# Patient Record
Sex: Female | Born: 1975
Health system: Southern US, Community
[De-identification: ages and names within clinical notes are randomized; demographics above are authoritative.]

## PROBLEM LIST (undated history)

## (undated) DIAGNOSIS — J302 Other seasonal allergic rhinitis: Secondary | ICD-10-CM

## (undated) DIAGNOSIS — C210 Malignant neoplasm of anus, unspecified: Secondary | ICD-10-CM

## (undated) DIAGNOSIS — IMO0002 Reserved for concepts with insufficient information to code with codable children: Secondary | ICD-10-CM

## (undated) DIAGNOSIS — T7840XA Allergy, unspecified, initial encounter: Secondary | ICD-10-CM

## (undated) DIAGNOSIS — M1990 Unspecified osteoarthritis, unspecified site: Secondary | ICD-10-CM

## (undated) DIAGNOSIS — I82409 Acute embolism and thrombosis of unspecified deep veins of unspecified lower extremity: Secondary | ICD-10-CM

## (undated) DIAGNOSIS — I10 Essential (primary) hypertension: Secondary | ICD-10-CM

## (undated) DIAGNOSIS — Z803 Family history of malignant neoplasm of breast: Secondary | ICD-10-CM

## (undated) DIAGNOSIS — M21371 Foot drop, right foot: Secondary | ICD-10-CM

## (undated) DIAGNOSIS — G709 Myoneural disorder, unspecified: Secondary | ICD-10-CM

## (undated) DIAGNOSIS — E785 Hyperlipidemia, unspecified: Secondary | ICD-10-CM

## (undated) DIAGNOSIS — F4310 Post-traumatic stress disorder, unspecified: Secondary | ICD-10-CM

## (undated) DIAGNOSIS — Z5189 Encounter for other specified aftercare: Secondary | ICD-10-CM

## (undated) DIAGNOSIS — E039 Hypothyroidism, unspecified: Secondary | ICD-10-CM

## (undated) DIAGNOSIS — G35 Multiple sclerosis: Secondary | ICD-10-CM

## (undated) DIAGNOSIS — N319 Neuromuscular dysfunction of bladder, unspecified: Secondary | ICD-10-CM

## (undated) DIAGNOSIS — F32A Depression, unspecified: Secondary | ICD-10-CM

## (undated) DIAGNOSIS — R269 Unspecified abnormalities of gait and mobility: Secondary | ICD-10-CM

## (undated) DIAGNOSIS — E079 Disorder of thyroid, unspecified: Secondary | ICD-10-CM

## (undated) DIAGNOSIS — Z95828 Presence of other vascular implants and grafts: Secondary | ICD-10-CM

## (undated) DIAGNOSIS — F329 Major depressive disorder, single episode, unspecified: Secondary | ICD-10-CM

## (undated) HISTORY — DX: Other seasonal allergic rhinitis: J30.2

## (undated) HISTORY — DX: Disorder of thyroid, unspecified: E07.9

## (undated) HISTORY — DX: Unspecified abnormalities of gait and mobility: R26.9

## (undated) HISTORY — DX: Hyperlipidemia, unspecified: E78.5

## (undated) HISTORY — PX: PORTACATH PLACEMENT: SHX2246

## (undated) HISTORY — DX: Reserved for concepts with insufficient information to code with codable children: IMO0002

## (undated) HISTORY — DX: Encounter for other specified aftercare: Z51.89

## (undated) HISTORY — DX: Depression, unspecified: F32.A

## (undated) HISTORY — DX: Foot drop, right foot: M21.371

## (undated) HISTORY — DX: Family history of malignant neoplasm of breast: Z80.3

## (undated) HISTORY — DX: Neuromuscular dysfunction of bladder, unspecified: N31.9

## (undated) HISTORY — DX: Myoneural disorder, unspecified: G70.9

## (undated) HISTORY — DX: Allergy, unspecified, initial encounter: T78.40XA

## (undated) HISTORY — DX: Post-traumatic stress disorder, unspecified: F43.10

## (undated) HISTORY — DX: Essential (primary) hypertension: I10

## (undated) HISTORY — DX: Unspecified osteoarthritis, unspecified site: M19.90

## (undated) HISTORY — DX: Acute embolism and thrombosis of unspecified deep veins of unspecified lower extremity: I82.409

## (undated) HISTORY — DX: Multiple sclerosis: G35

## (undated) HISTORY — DX: Major depressive disorder, single episode, unspecified: F32.9

## (undated) HISTORY — PX: COLON SURGERY: SHX602

---
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
## 2008-05-01 HISTORY — PX: GASTRIC BYPASS: SHX52

## 2010-11-07 ENCOUNTER — Ambulatory Visit: Payer: Self-pay | Admitting: Internal Medicine

## 2010-12-19 ENCOUNTER — Ambulatory Visit: Payer: Self-pay

## 2011-01-16 DIAGNOSIS — F32A Depression, unspecified: Secondary | ICD-10-CM | POA: Insufficient documentation

## 2011-01-16 DIAGNOSIS — F329 Major depressive disorder, single episode, unspecified: Secondary | ICD-10-CM | POA: Insufficient documentation

## 2011-03-12 ENCOUNTER — Ambulatory Visit: Payer: Self-pay | Admitting: Internal Medicine

## 2011-05-02 DIAGNOSIS — Z9289 Personal history of other medical treatment: Secondary | ICD-10-CM

## 2011-05-02 DIAGNOSIS — I82409 Acute embolism and thrombosis of unspecified deep veins of unspecified lower extremity: Secondary | ICD-10-CM

## 2011-05-02 HISTORY — DX: Personal history of other medical treatment: Z92.89

## 2011-05-02 HISTORY — DX: Acute embolism and thrombosis of unspecified deep veins of unspecified lower extremity: I82.409

## 2011-06-21 ENCOUNTER — Ambulatory Visit: Payer: Self-pay

## 2011-06-22 ENCOUNTER — Ambulatory Visit: Payer: Self-pay

## 2011-06-25 ENCOUNTER — Ambulatory Visit: Payer: Self-pay | Admitting: Internal Medicine

## 2011-07-26 DIAGNOSIS — Z9884 Bariatric surgery status: Secondary | ICD-10-CM | POA: Insufficient documentation

## 2012-02-05 ENCOUNTER — Ambulatory Visit: Payer: Self-pay

## 2012-02-05 LAB — URINALYSIS, COMPLETE
Blood: NEGATIVE
Ketone: NEGATIVE
Leukocyte Esterase: NEGATIVE
Nitrite: NEGATIVE
Ph: 6.5 (ref 4.5–8.0)
Protein: NEGATIVE
Specific Gravity: 1.015 (ref 1.003–1.030)

## 2012-02-07 LAB — URINE CULTURE

## 2012-05-01 HISTORY — PX: INTERSTIM IMPLANT PLACEMENT: SHX5130

## 2014-11-26 DIAGNOSIS — G35 Multiple sclerosis: Secondary | ICD-10-CM | POA: Diagnosis not present

## 2014-11-26 DIAGNOSIS — N319 Neuromuscular dysfunction of bladder, unspecified: Secondary | ICD-10-CM | POA: Diagnosis not present

## 2014-11-26 DIAGNOSIS — N3941 Urge incontinence: Secondary | ICD-10-CM | POA: Diagnosis not present

## 2014-11-30 DIAGNOSIS — G35 Multiple sclerosis: Secondary | ICD-10-CM | POA: Diagnosis not present

## 2014-11-30 DIAGNOSIS — N3941 Urge incontinence: Secondary | ICD-10-CM | POA: Diagnosis not present

## 2014-12-22 DIAGNOSIS — F331 Major depressive disorder, recurrent, moderate: Secondary | ICD-10-CM | POA: Diagnosis not present

## 2014-12-28 DIAGNOSIS — F331 Major depressive disorder, recurrent, moderate: Secondary | ICD-10-CM | POA: Diagnosis not present

## 2015-01-05 DIAGNOSIS — R300 Dysuria: Secondary | ICD-10-CM | POA: Diagnosis not present

## 2015-01-08 DIAGNOSIS — G35 Multiple sclerosis: Secondary | ICD-10-CM | POA: Diagnosis not present

## 2015-01-08 DIAGNOSIS — R5382 Chronic fatigue, unspecified: Secondary | ICD-10-CM | POA: Diagnosis not present

## 2015-01-18 DIAGNOSIS — G35 Multiple sclerosis: Secondary | ICD-10-CM | POA: Diagnosis not present

## 2015-01-19 DIAGNOSIS — K219 Gastro-esophageal reflux disease without esophagitis: Secondary | ICD-10-CM | POA: Diagnosis not present

## 2015-01-19 DIAGNOSIS — N3941 Urge incontinence: Secondary | ICD-10-CM | POA: Diagnosis not present

## 2015-01-19 DIAGNOSIS — G35 Multiple sclerosis: Secondary | ICD-10-CM | POA: Diagnosis not present

## 2015-01-19 DIAGNOSIS — F329 Major depressive disorder, single episode, unspecified: Secondary | ICD-10-CM | POA: Diagnosis not present

## 2015-01-19 DIAGNOSIS — N319 Neuromuscular dysfunction of bladder, unspecified: Secondary | ICD-10-CM | POA: Diagnosis not present

## 2015-01-19 DIAGNOSIS — E039 Hypothyroidism, unspecified: Secondary | ICD-10-CM | POA: Diagnosis not present

## 2015-01-19 DIAGNOSIS — Z9889 Other specified postprocedural states: Secondary | ICD-10-CM | POA: Diagnosis not present

## 2015-01-19 DIAGNOSIS — Z01818 Encounter for other preprocedural examination: Secondary | ICD-10-CM | POA: Diagnosis not present

## 2015-01-25 DIAGNOSIS — E039 Hypothyroidism, unspecified: Secondary | ICD-10-CM | POA: Diagnosis not present

## 2015-01-25 DIAGNOSIS — N3281 Overactive bladder: Secondary | ICD-10-CM | POA: Diagnosis not present

## 2015-01-25 DIAGNOSIS — D649 Anemia, unspecified: Secondary | ICD-10-CM | POA: Diagnosis not present

## 2015-01-25 DIAGNOSIS — F4310 Post-traumatic stress disorder, unspecified: Secondary | ICD-10-CM | POA: Diagnosis not present

## 2015-01-25 DIAGNOSIS — J309 Allergic rhinitis, unspecified: Secondary | ICD-10-CM | POA: Diagnosis not present

## 2015-01-25 DIAGNOSIS — Z86718 Personal history of other venous thrombosis and embolism: Secondary | ICD-10-CM | POA: Diagnosis not present

## 2015-01-25 DIAGNOSIS — G35 Multiple sclerosis: Secondary | ICD-10-CM | POA: Diagnosis not present

## 2015-01-25 DIAGNOSIS — Z87891 Personal history of nicotine dependence: Secondary | ICD-10-CM | POA: Diagnosis not present

## 2015-01-25 DIAGNOSIS — K219 Gastro-esophageal reflux disease without esophagitis: Secondary | ICD-10-CM | POA: Diagnosis not present

## 2015-01-25 DIAGNOSIS — K5900 Constipation, unspecified: Secondary | ICD-10-CM | POA: Diagnosis not present

## 2015-01-25 DIAGNOSIS — F329 Major depressive disorder, single episode, unspecified: Secondary | ICD-10-CM | POA: Diagnosis not present

## 2015-01-25 DIAGNOSIS — Z9884 Bariatric surgery status: Secondary | ICD-10-CM | POA: Diagnosis not present

## 2015-01-25 DIAGNOSIS — E669 Obesity, unspecified: Secondary | ICD-10-CM | POA: Diagnosis not present

## 2015-01-25 DIAGNOSIS — N3941 Urge incontinence: Secondary | ICD-10-CM | POA: Diagnosis not present

## 2015-01-25 DIAGNOSIS — M1712 Unilateral primary osteoarthritis, left knee: Secondary | ICD-10-CM | POA: Diagnosis not present

## 2015-02-05 DIAGNOSIS — N3941 Urge incontinence: Secondary | ICD-10-CM | POA: Diagnosis not present

## 2015-03-30 DIAGNOSIS — F331 Major depressive disorder, recurrent, moderate: Secondary | ICD-10-CM | POA: Diagnosis not present

## 2015-04-15 DIAGNOSIS — R5383 Other fatigue: Secondary | ICD-10-CM | POA: Diagnosis not present

## 2015-04-15 DIAGNOSIS — N390 Urinary tract infection, site not specified: Secondary | ICD-10-CM | POA: Diagnosis not present

## 2015-04-15 DIAGNOSIS — R635 Abnormal weight gain: Secondary | ICD-10-CM | POA: Diagnosis not present

## 2015-04-15 DIAGNOSIS — E039 Hypothyroidism, unspecified: Secondary | ICD-10-CM | POA: Diagnosis not present

## 2015-04-15 DIAGNOSIS — R300 Dysuria: Secondary | ICD-10-CM | POA: Diagnosis not present

## 2015-06-17 DIAGNOSIS — N912 Amenorrhea, unspecified: Secondary | ICD-10-CM | POA: Diagnosis not present

## 2015-06-17 DIAGNOSIS — N959 Unspecified menopausal and perimenopausal disorder: Secondary | ICD-10-CM | POA: Diagnosis not present

## 2015-06-17 DIAGNOSIS — N951 Menopausal and female climacteric states: Secondary | ICD-10-CM | POA: Diagnosis not present

## 2015-07-03 DIAGNOSIS — N390 Urinary tract infection, site not specified: Secondary | ICD-10-CM | POA: Diagnosis not present

## 2015-07-03 DIAGNOSIS — R300 Dysuria: Secondary | ICD-10-CM | POA: Diagnosis not present

## 2016-01-31 ENCOUNTER — Encounter: Payer: Self-pay | Admitting: Family Medicine

## 2016-01-31 ENCOUNTER — Ambulatory Visit (INDEPENDENT_AMBULATORY_CARE_PROVIDER_SITE_OTHER): Payer: Managed Care, Other (non HMO) | Admitting: Family Medicine

## 2016-01-31 VITALS — BP 114/76 | HR 103 | Temp 97.6°F | Ht 65.0 in | Wt 191.1 lb

## 2016-01-31 DIAGNOSIS — E039 Hypothyroidism, unspecified: Secondary | ICD-10-CM | POA: Diagnosis not present

## 2016-01-31 DIAGNOSIS — F4310 Post-traumatic stress disorder, unspecified: Secondary | ICD-10-CM | POA: Diagnosis not present

## 2016-01-31 DIAGNOSIS — G35 Multiple sclerosis: Secondary | ICD-10-CM

## 2016-01-31 DIAGNOSIS — Z23 Encounter for immunization: Secondary | ICD-10-CM

## 2016-01-31 DIAGNOSIS — G4700 Insomnia, unspecified: Secondary | ICD-10-CM

## 2016-01-31 MED ORDER — RIFAXIMIN 550 MG PO TABS: 550.0000 mg | ORAL_TABLET | Freq: Three times a day (TID) | ORAL | 2 refills | Status: DC

## 2016-01-31 MED ORDER — CLONAZEPAM 2 MG PO TABS: 2.0000 mg | ORAL_TABLET | Freq: Two times a day (BID) | ORAL | 0 refills | Status: DC

## 2016-01-31 MED ORDER — GABAPENTIN 300 MG PO CAPS: ORAL_CAPSULE | ORAL | 3 refills | Status: DC

## 2016-01-31 MED ORDER — CLONAZEPAM 2 MG PO TABS: 2.0000 mg | ORAL_TABLET | Freq: Every day | ORAL | 0 refills | Status: DC

## 2016-01-31 MED ORDER — MIRTAZAPINE 45 MG PO TBDP: 45.0000 mg | ORAL_TABLET | Freq: Every day | ORAL | 2 refills | Status: DC
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
## 2016-02-01 ENCOUNTER — Telehealth: Payer: Self-pay

## 2016-02-01 LAB — CMP14+EGFR
A/G RATIO: 4 — AB (ref 1.2–2.2)
ALBUMIN: 4 g/dL (ref 3.5–5.5)
ALT: 15 IU/L (ref 0–32)
AST: 24 IU/L (ref 0–40)
Alkaline Phosphatase: 118 IU/L — ABNORMAL HIGH (ref 39–117)
BUN / CREAT RATIO: 13 (ref 9–23)
BUN: 9 mg/dL (ref 6–24)
Bilirubin Total: 0.3 mg/dL (ref 0.0–1.2)
CALCIUM: 9.4 mg/dL (ref 8.7–10.2)
CO2: 27 mmol/L (ref 18–29)
CREATININE: 0.71 mg/dL (ref 0.57–1.00)
Chloride: 104 mmol/L (ref 96–106)
GFR, EST AFRICAN AMERICAN: 123 mL/min/{1.73_m2} (ref >59)
GFR, EST NON AFRICAN AMERICAN: 107 mL/min/{1.73_m2} (ref >59)
Globulin, Total: 1 g/dL — ABNORMAL LOW (ref 1.5–4.5)
Glucose: 83 mg/dL (ref 65–99)
POTASSIUM: 4.3 mmol/L (ref 3.5–5.2)
SODIUM: 145 mmol/L — AB (ref 134–144)
TOTAL PROTEIN: 5 g/dL — AB (ref 6.0–8.5)

## 2016-02-01 LAB — CBC WITH DIFFERENTIAL/PLATELET
BASOS: 1 %
Basophils Absolute: 0.1 10*3/uL (ref 0.0–0.2)
EOS (ABSOLUTE): 0.1 10*3/uL (ref 0.0–0.4)
EOS: 2 %
HEMATOCRIT: 37.7 % (ref 34.0–46.6)
HEMOGLOBIN: 12 g/dL (ref 11.1–15.9)
IMMATURE GRANULOCYTES: 0 %
Immature Grans (Abs): 0 10*3/uL (ref 0.0–0.1)
Lymphocytes Absolute: 1.4 10*3/uL (ref 0.7–3.1)
Lymphs: 22 %
MCH: 30.7 pg (ref 26.6–33.0)
MCHC: 31.8 g/dL (ref 31.5–35.7)
MCV: 96 fL (ref 79–97)
MONOCYTES: 17 %
MONOS ABS: 1 10*3/uL — AB (ref 0.1–0.9)
NEUTROS PCT: 58 %
Neutrophils Absolute: 3.7 10*3/uL (ref 1.4–7.0)
Platelets: 409 10*3/uL — ABNORMAL HIGH (ref 150–379)
RBC: 3.91 x10E6/uL (ref 3.77–5.28)
RDW: 18.6 % — AB (ref 12.3–15.4)
WBC: 6.3 10*3/uL (ref 3.4–10.8)

## 2016-02-01 LAB — TSH+FREE T4
Free T4: 1.25 ng/dL (ref 0.82–1.77)
TSH: 0.923 u[IU]/mL (ref 0.450–4.500)

## 2016-02-01 NOTE — Telephone Encounter (Signed)
Use IBS with diarrhea

## 2016-02-01 NOTE — Telephone Encounter (Signed)
Please advise 

## 2016-02-02 NOTE — Progress Notes (Signed)
Patient aware.

## 2016-02-10 ENCOUNTER — Ambulatory Visit: Payer: Self-pay | Admitting: Licensed Clinical Social Worker

## 2016-03-01 ENCOUNTER — Ambulatory Visit: Payer: Private Health Insurance - Indemnity | Admitting: Psychology

## 2016-03-03 ENCOUNTER — Ambulatory Visit (INDEPENDENT_AMBULATORY_CARE_PROVIDER_SITE_OTHER): Payer: Medicare Other | Admitting: Family Medicine

## 2016-03-03 VITALS — BP 112/68 | HR 106 | Ht 65.0 in | Wt 189.2 lb

## 2016-03-03 DIAGNOSIS — Z1231 Encounter for screening mammogram for malignant neoplasm of breast: Secondary | ICD-10-CM

## 2016-03-03 DIAGNOSIS — Z1239 Encounter for other screening for malignant neoplasm of breast: Secondary | ICD-10-CM

## 2016-03-03 MED ORDER — MIRTAZAPINE 45 MG PO TBDP: 45.0000 mg | ORAL_TABLET | Freq: Every day | ORAL | 2 refills | Status: DC

## 2016-03-03 MED ORDER — CLONAZEPAM 2 MG PO TABS: 2.0000 mg | ORAL_TABLET | Freq: Every day | ORAL | 0 refills | Status: DC

## 2016-03-03 NOTE — Progress Notes (Signed)
Subjective:  Patient ID: Amber Williams, female    DOB: 11/07/1975  Age: 40 y.o. MRN: [id number]  CC: Medication Management (pt here today to discuss her medications)   HPI Amber Williams presents for started crying all the time. Switched back to paxil. Can't sleep  Depression screen [facility] 2/9 03/03/2016 01/31/2016  Decreased Interest 0 0  Down, Depressed, Hopeless 0 0  PHQ - 2 Score 0 0    History Amber Williams has a past medical history of Allergy; Arthritis; Blood transfusion without reported diagnosis; Depression; DVT (deep venous thrombosis) ([place]) (2013); Hyperlipidemia; Hypertension; Neuromuscular disorder ([place]); Thyroid disease; and Ulcer ([place]).   She has a past surgical history that includes Colon surgery and Gastric bypass (2010).   Her family history includes Cancer in her mother; Heart disease in her maternal grandfather, maternal grandmother, and paternal grandfather; Hypertension in her father and sister; Miscarriages / Stillbirths in her sister; Stroke in her paternal grandfather.She reports that she quit smoking about 7 years ago. She started smoking about 24 years ago. She has never used smokeless tobacco. She reports that she drinks about 1.8 oz of alcohol per week . She reports that she does not use drugs.    ROS Review of Systems  Constitutional: Negative for fever.  HENT: Negative for congestion, rhinorrhea and sore throat.   Respiratory: Negative for cough and shortness of breath.   Cardiovascular: Negative for chest pain and palpitations.  Gastrointestinal: Negative for abdominal pain.  Musculoskeletal: Negative for arthralgias and myalgias.  Psychiatric/Behavioral: Positive for dysphoric mood. The patient is nervous/anxious.     Objective:  BP 112/68   Pulse (!) 106   Ht 5\' 5"  (1.651 m)   Wt 189 lb 4 oz (85.8 kg)   BMI 31.49 kg/m   BP Readings from Last 3 Encounters:  03/03/16 112/68  01/31/16 114/76    Wt Readings from Last 3 Encounters:  03/03/16  189 lb 4 oz (85.8 kg)  01/31/16 191 lb 2 oz (86.7 kg)     Physical Exam  Constitutional: She is oriented to person, place, and time. She appears well-developed and well-nourished. No distress.  HENT:  Head: Normocephalic and atraumatic.  Eyes: Conjunctivae are normal. Pupils are equal, round, and reactive to light.  Neck: Normal range of motion. Neck supple. No thyromegaly present.  Cardiovascular: Normal rate, regular rhythm and normal heart sounds.   No murmur heard. Pulmonary/Chest: Effort normal and breath sounds normal. No respiratory distress. She has no wheezes. She has no rales.  Abdominal: Soft. Bowel sounds are normal. She exhibits no distension. There is no tenderness.  Musculoskeletal: Normal range of motion.  Lymphadenopathy:    She has no cervical adenopathy.  Neurological: She is alert and oriented to person, place, and time.  Skin: Skin is warm and dry.  Psychiatric: She has a normal mood and affect. Her behavior is normal. Judgment and thought content normal.     Lab Results  Component Value Date   WBC 6.3 01/31/2016   HCT 37.7 01/31/2016   PLT 409 (H) 01/31/2016   GLUCOSE 83 01/31/2016   ALT 15 01/31/2016   AST 24 01/31/2016   NA 145 (H) 01/31/2016   K 4.3 01/31/2016   CL 104 01/31/2016   CREATININE 0.71 01/31/2016   BUN 9 01/31/2016   CO2 27 01/31/2016   TSH 0.923 01/31/2016    No results found.  Assessment & Plan:   Amber Williams was seen today for medication management.  Diagnoses and all orders  for this visit:  Screening for breast cancer -     MM DIGITAL SCREENING BILATERAL; Future  Other orders -     clonazePAM (KLONOPIN) 2 MG tablet; Take 1 tablet (2 mg total) by mouth at bedtime. -     mirtazapine (REMERON SOL-TAB) 45 MG disintegrating tablet; Take 1 tablet (45 mg total) by mouth at bedtime.    I have discontinued Amber Williams rifaximin and temazepam. [name] levothyroxine, omeprazole, Vitamin D3, RiTUXimab  (RITUXAN IV), gabapentin, PARoxetine, clonazePAM, and mirtazapine.  Meds ordered this encounter  Medications  . DISCONTD: temazepam (RESTORIL) 30 MG capsule    Sig: Take by mouth.  [name] PARoxetine (PAXIL) 30 MG tablet    Sig: Take by mouth.  . clonazePAM (KLONOPIN) 2 MG tablet    Sig: Take 1 tablet (2 mg total) by mouth at bedtime.    Dispense:  30 tablet    Refill:  0  . mirtazapine (REMERON SOL-TAB) 45 MG disintegrating tablet    Sig: Take 1 tablet (45 mg total) by mouth at bedtime.    Dispense:  30 tablet    Refill:  2     Follow-up: Return in about 6 weeks (around 04/14/2016) for insomnia, depression.  [name], M.D.

## 2016-03-04 ENCOUNTER — Encounter (HOSPITAL_COMMUNITY): Payer: Self-pay

## 2016-03-04 ENCOUNTER — Emergency Department (HOSPITAL_COMMUNITY): Payer: Medicare Other

## 2016-03-04 ENCOUNTER — Emergency Department (HOSPITAL_COMMUNITY)
Admission: EM | Admit: 2016-03-04 | Discharge: 2016-03-04 | Disposition: A | Discharge status: Home / Self Care | Payer: Medicare Other | Attending: Emergency Medicine | Admitting: Emergency Medicine

## 2016-03-04 DIAGNOSIS — Z87891 Personal history of nicotine dependence: Secondary | ICD-10-CM | POA: Insufficient documentation

## 2016-03-04 DIAGNOSIS — W19XXXA Unspecified fall, initial encounter: Secondary | ICD-10-CM | POA: Diagnosis not present

## 2016-03-04 DIAGNOSIS — Y939 Activity, unspecified: Secondary | ICD-10-CM | POA: Insufficient documentation

## 2016-03-04 DIAGNOSIS — I10 Essential (primary) hypertension: Secondary | ICD-10-CM | POA: Diagnosis not present

## 2016-03-04 DIAGNOSIS — S098XXA Other specified injuries of head, initial encounter: Secondary | ICD-10-CM | POA: Diagnosis not present

## 2016-03-04 DIAGNOSIS — E039 Hypothyroidism, unspecified: Secondary | ICD-10-CM | POA: Insufficient documentation

## 2016-03-04 DIAGNOSIS — Y999 Unspecified external cause status: Secondary | ICD-10-CM | POA: Insufficient documentation

## 2016-03-04 DIAGNOSIS — S0012XA Contusion of left eyelid and periocular area, initial encounter: Secondary | ICD-10-CM | POA: Diagnosis not present

## 2016-03-04 DIAGNOSIS — Y92009 Unspecified place in unspecified non-institutional (private) residence as the place of occurrence of the external cause: Secondary | ICD-10-CM | POA: Diagnosis not present

## 2016-03-04 DIAGNOSIS — S0083XA Contusion of other part of head, initial encounter: Secondary | ICD-10-CM | POA: Diagnosis not present

## 2016-03-04 DIAGNOSIS — S0592XA Unspecified injury of left eye and orbit, initial encounter: Secondary | ICD-10-CM

## 2016-03-04 DIAGNOSIS — S0990XA Unspecified injury of head, initial encounter: Secondary | ICD-10-CM | POA: Diagnosis not present

## 2016-03-04 NOTE — ED Notes (Signed)
Pt tolerated ambulation well with assistance. Family states that ambulation was similar to how she usually walks at home, only a little worse since she did not have her cane or glasses.

## 2016-03-04 NOTE — ED Provider Notes (Signed)
Emergency Department Provider Note   I have reviewed the triage vital signs and the nursing notes.   HISTORY  Chief Complaint Alcohol Intoxication (fall)   HPI Amber Williams is a 40 y.o. female with PMH of depression, HTN, HLD, and MS presents to the emergency department for evaluation of multiple falls at home. The patient cannot recall either fall. She states that she has been drinking both yesterday and today. She does not believe this was a syncope event. She denies using other drugs. She does have multiple sclerosis and some baseline difficulty with ambulation but states it does not seem like she is having a flare of the symptoms are worse than normal. She is complaining of an area of swelling and ecchymosis primarily around the left eye. No vision changes from that eye. No pain with extraocular movement.Denies pain in any other part of her body.  Past Medical History:  Diagnosis Date  . Allergy   . Arthritis   . Blood transfusion without reported diagnosis    2013  . Depression   . DVT (deep venous thrombosis) ([place]) 2013  . Hyperlipidemia   . Hypertension   . Neuromuscular disorder ([place])    dx'd with MS at age of 80  . Thyroid disease   . Ulcer [facility])     Patient Active Problem List   Diagnosis Date Noted  . Hypothyroidism 01/31/2016  . Insomnia 01/31/2016  . Multiple sclerosis ([place]) 01/31/2016  . PTSD (post-traumatic stress disorder) 01/31/2016    Past Surgical History:  Procedure Laterality Date  . COLON SURGERY     Colonoscopy  . GASTRIC BYPASS  2010    Current Outpatient Rx  . Order #: [id number] Class: Historical Med  . Order #: [id number] Class: Print  . Order #: [id number] Class: Historical Med  . Order #: [id number] Class: Normal  . Order #: [id number] Class: Historical Med  . Order #: [id number] Class: Normal  . Order #: [id number] Class: Historical Med  . Order #: [id number] Class: Historical Med  . Order #: [id number] Class: Historical Med     Allergies Tecfidera [dimethyl fumarate]  Family History  Problem Relation Age of Onset  . Cancer Mother     Dx'd at age 9  . Hypertension Father   . Hypertension Sister   . Miscarriages / Stillbirths Sister   . Heart disease Maternal Grandmother   . Heart disease Maternal Grandfather   . Heart disease Paternal Grandfather   . Stroke Paternal Grandfather     Social History Social History  Substance Use Topics  . Smoking status: Former Smoker    Start date: 1993    Quit date: 01/30/2009  . Smokeless tobacco: Never Used  . Alcohol use 1.8 oz/week    3 Shots of liquor per week    Review of Systems  Constitutional: No fever/chills Eyes: No visual changes. Pain and swelling around left eye.  ENT: No sore throat. Cardiovascular: Denies chest pain. Respiratory: Denies shortness of breath. Gastrointestinal: No abdominal pain.  No nausea, no vomiting.  No diarrhea.  No constipation. Genitourinary: Negative for dysuria. Musculoskeletal: Negative for back pain. Skin: Negative for rash. Neurological: Negative for headaches, focal weakness or numbness.  10-point ROS otherwise negative.  ____________________________________________   PHYSICAL EXAM:  VITAL SIGNS: ED Triage Vitals [03/04/16 1440]  Enc Vitals Group     BP (!) 119/42     Pulse Rate 89     Resp 18     Temp 97.6 F (36.4 C)     Temp Source Oral  SpO2 95 %   Constitutional: Alert and oriented. Well appearing and in no acute distress. Eyes: Conjunctivae are mildly injected on the left. PERRL. EOMI without pain. Ecchymosis and periorbital edema around left eye.  Head: Atraumatic. Nose: No congestion/rhinnorhea. Mouth/Throat: Mucous membranes are moist.  Oropharynx non-erythematous. Neck: No stridor. No cervical spine tenderness to palpation. Cardiovascular: Normal rate, regular rhythm. Good peripheral circulation. Grossly normal heart sounds.   Respiratory: Normal respiratory effort.  No retractions.  Lungs CTAB. Gastrointestinal: Soft and nontender. No distention.  Musculoskeletal: No lower extremity tenderness nor edema. No gross deformities of extremities. Neurologic:  Normal speech and language. No gross focal neurologic deficits are appreciated.  Skin:  Skin is warm, dry and intact. No rash noted.  ____________________________________________   LABS (all labs ordered are listed, but only abnormal results are displayed)  None  ____________________________________________  EKG   EKG Interpretation  Date/Time:  Saturday March 04 2016 16:28:56 EDT Ventricular Rate:  101 PR Interval:    QRS Duration: 138 QT Interval:  388 QTC Calculation: 503 R Axis:   -16 Text Interpretation:  Sinus tachycardia Nonspecific intraventricular conduction delay Anteroseptal infarct, old Artifact in lead(s) I III aVR aVL aVF V2 V6 No clear STEMI.  Confirmed by Reginaldo Hazard MD, Lylianna Fraiser [phone]) on 03/04/2016 4:40:35 PM       ____________________________________________  RADIOLOGY  Ct Head Wo Contrast  Result Date: 03/04/2016 CLINICAL DATA:  Fall yesterday.  Right height bruising. EXAM: CT HEAD WITHOUT CONTRAST CT MAXILLOFACIAL WITHOUT CONTRAST TECHNIQUE: Multidetector CT imaging of the head and cervical spine was performed following the standard protocol without intravenous contrast. Multiplanar CT image reconstructions of the cervical spine were also generated. COMPARISON:  None. FINDINGS: CT HEAD FINDINGS Brain: No evidence of acute infarction, hemorrhage, hydrocephalus, extra-axial collection or mass lesion/mass effect. Vascular: No hyperdense vessel or unexpected calcification. Skull: Normal. Negative for fracture or focal lesion. Sinuses/Orbits: Mild right maxillary sinus mucosal thickening. Remaining visualized sinuses and mastoid air cells are clear. Left preseptal periorbital soft tissue swelling. Globes and orbits otherwise unremarkable. Other: None. CT MAXILLOFACIAL FINDINGS Osseous structures: No  fracture. No osteoblastic or osteolytic lesions. Globes and orbits: There is left preseptal periorbital soft tissue swelling. The left globe is unremarkable. Normal postseptal left orbit. Normal right globe and orbit. Sinuses/mastoid air cells: Moderate right maxillary sinus mucosal thickening. Remaining sinuses are clear. Clear mastoid air cells and middle ear cavities. Soft tissues: No other soft tissue abnormality. No soft tissue mass or adenopathy. Other: None IMPRESSION: HEAD CT:  No acute intracranial abnormalities.  No skull fracture. MAXILLOFACIAL CT: Left periorbital preseptal soft tissue swelling. No injury to the left globe or postseptal orbit. No fractures. Electronically Signed   By: Lajean Manes M.D.   On: 03/04/2016 15:54   Ct Maxillofacial Wo Contrast  Result Date: 03/04/2016 CLINICAL DATA:  Fall yesterday.  Right height bruising. EXAM: CT HEAD WITHOUT CONTRAST CT MAXILLOFACIAL WITHOUT CONTRAST TECHNIQUE: Multidetector CT imaging of the head and cervical spine was performed following the standard protocol without intravenous contrast. Multiplanar CT image reconstructions of the cervical spine were also generated. COMPARISON:  None. FINDINGS: CT HEAD FINDINGS Brain: No evidence of acute infarction, hemorrhage, hydrocephalus, extra-axial collection or mass lesion/mass effect. Vascular: No hyperdense vessel or unexpected calcification. Skull: Normal. Negative for fracture or focal lesion. Sinuses/Orbits: Mild right maxillary sinus mucosal thickening. Remaining visualized sinuses and mastoid air cells are clear. Left preseptal periorbital soft tissue swelling. Globes and orbits otherwise unremarkable. Other: None. CT MAXILLOFACIAL FINDINGS Osseous structures: No  fracture. No osteoblastic or osteolytic lesions. Globes and orbits: There is left preseptal periorbital soft tissue swelling. The left globe is unremarkable. Normal postseptal left orbit. Normal right globe and orbit. Sinuses/mastoid air  cells: Moderate right maxillary sinus mucosal thickening. Remaining sinuses are clear. Clear mastoid air cells and middle ear cavities. Soft tissues: No other soft tissue abnormality. No soft tissue mass or adenopathy. Other: None IMPRESSION: HEAD CT:  No acute intracranial abnormalities.  No skull fracture. MAXILLOFACIAL CT: Left periorbital preseptal soft tissue swelling. No injury to the left globe or postseptal orbit. No fractures. Electronically Signed   By: Lajean Manes M.D.   On: 03/04/2016 15:54    ____________________________________________   PROCEDURES  Procedure(s) performed:   Procedures  None ____________________________________________   INITIAL IMPRESSION / ASSESSMENT AND PLAN / ED COURSE  Pertinent labs & imaging results that were available during my care of the patient were reviewed by me and considered in my medical decision making (see chart for details).  Patient with past medical history of multiple sclerosis presents to the emergency department after multiple falls in the setting of drinking alcohol. Her only apparent injury on my exam is some left periorbital edema and ecchymosis. She has intact extraocular movements and normal vision. No evidence of entrapment. Patient is not anticoagulated. She reports to me that she does not feel her alcohol is a problem for her and is not interested in pursuing either inpatient or outpatient detox and rehabilitation. I offered information at discharge if she is interested. Plan for screening EKG and CT scan of the head and face.    04:01 PM Different family members now at bedside. CT scan of the head and face are negative for fracture or acute bleed. Patient is feeling well. She typically ambulates with a cane. Plan for ambulation trial here in the emergency department and reassessment. Plan for Neurology and PCP follow up at home.   05:47 PM Patient ambulated in the emergency department. Patient and family at bedside agree  that she has her baseline level of stability. She is aren't getting physical therapy 3 days a week. Plan for discharge home. Offered referral for alcohol related treatment but the patient refused.   At this time, I do not feel there is any life-threatening condition present. I have reviewed and discussed all results (EKG, imaging, lab, urine as appropriate), exam findings with patient. I have reviewed nursing notes and appropriate previous records.  I feel the patient is safe to be discharged home without further emergent workup. Discussed usual and customary return precautions. Patient and family (if present) verbalize understanding and are comfortable with this plan.  Patient will follow-up with their primary care provider. If they do not have a primary care provider, information for follow-up has been provided to them. All questions have been answered.  ____________________________________________  FINAL CLINICAL IMPRESSION(S) / ED DIAGNOSES  Final diagnoses:  Fall, initial encounter  Injury of head, initial encounter  Left eye injury, initial encounter     MEDICATIONS GIVEN DURING THIS VISIT:  None  NEW OUTPATIENT MEDICATIONS STARTED DURING THIS VISIT:  None   Note:  This document was prepared using Dragon voice recognition software and may include unintentional dictation errors.  Nanda Quinton, MD Emergency Medicine   Margette Fast, MD 03/04/16 [phone]

## 2016-03-04 NOTE — ED Triage Notes (Signed)
Pt EMS,family called for them to check on pt because they had not from hear from her in a few days. Per EMS, when they arrived pt appeared to be intoxicated. The stated pt blew 0.17 on a breathalyzers. Pt admits to drinking yesterday and today. States she fell yesterday. Pt has black and blue eye to right.

## 2016-03-04 NOTE — Discharge Instructions (Signed)

## 2016-03-05 ENCOUNTER — Encounter: Payer: Self-pay | Admitting: Family Medicine

## 2016-03-17 ENCOUNTER — Ambulatory Visit (INDEPENDENT_AMBULATORY_CARE_PROVIDER_SITE_OTHER): Payer: Medicare Other | Admitting: Psychology

## 2016-03-17 DIAGNOSIS — F4321 Adjustment disorder with depressed mood: Secondary | ICD-10-CM

## 2016-03-20 ENCOUNTER — Ambulatory Visit (INDEPENDENT_AMBULATORY_CARE_PROVIDER_SITE_OTHER): Payer: Medicare Other | Admitting: Neurology

## 2016-03-20 ENCOUNTER — Encounter: Payer: Self-pay | Admitting: Neurology

## 2016-03-20 VITALS — BP 119/68 | HR 101 | Ht 65.0 in | Wt 189.0 lb

## 2016-03-20 DIAGNOSIS — G35 Multiple sclerosis: Secondary | ICD-10-CM | POA: Diagnosis not present

## 2016-03-20 DIAGNOSIS — R269 Unspecified abnormalities of gait and mobility: Secondary | ICD-10-CM

## 2016-03-20 DIAGNOSIS — Z5181 Encounter for therapeutic drug level monitoring: Secondary | ICD-10-CM | POA: Diagnosis not present

## 2016-03-20 NOTE — Progress Notes (Signed)
Reason for visit: Multiple sclerosis  Referring physician: Dr. Garry Heater is a 40 y.o. female  History of present illness:  Ms. Sinkler is a 40 year old right-handed white female with a history of multiple sclerosis with symptom onset around age 62. The patient began having double vision and gait problems at that time, she was felt to have ADEM. She was not placed on treatment for multiple sclerosis until about 3 years later. She initially was started on Betaseron, then she went to Avonex, then Copaxone, Rebif, Novantrone, Tecfidera, Gilenya, and most recently Rituxan. The patient has been followed by Dr. Nigel Bridgeman at [facility]. The patient has had MRI evaluations of the brain and cervical spine done in June 2017 that showed good stability. The patient last received a dose of rituximab in September 2017. She is getting 500 mg every 4 months, she has tolerated the drug well, but she continues to have an occasional MS exacerbation. The last exacerbation requiring steroids was in August 2017. The patient has recently moved to this area, she reports some right-sided weakness with bilateral lower extremity weakness, some bladder dysfunction. The patient has an InterStim stimulator in place that is MRI compatible. The patient reports some visual disturbances, most prominent in the right eye. She has cognitive slowing. She indicates that she operates a motor vehicle, but she has to pick up her right leg in order to change from the accelerator to the brake at times. The patient walks with a cane, but he also has a walker and a wheelchair. She reports a recent fall while drinking alcohol on 03/04/2016. The patient had a CT scan of the brain done around that time that was unremarkable. The patient comes to this office for an evaluation.  Past Medical History:  Diagnosis Date  . Allergy   . Arthritis   . Blood transfusion without reported diagnosis    2013  . Depression   . DVT (deep venous  thrombosis) ([place]) 2013  . Hyperlipidemia   . Hypertension   . Neuromuscular disorder ([place])    dx'd with MS at age of 27  . Thyroid disease   . Ulcer [facility])     Past Surgical History:  Procedure Laterality Date  . COLON SURGERY     Colonoscopy  . GASTRIC BYPASS  2010  . INTERSTIM IMPLANT PLACEMENT  2014  . PORTACATH PLACEMENT      Family History  Problem Relation Age of Onset  . Cancer Mother     Dx'd at age 85  . Breast cancer Mother   . Hypertension Father   . Diabetes Father   . Hypertension Sister   . Miscarriages / Stillbirths Sister   . Heart disease Maternal Grandmother   . Diabetes Maternal Grandmother   . Heart disease Maternal Grandfather   . Diabetes Paternal Grandmother   . Heart disease Paternal Grandfather   . Stroke Paternal Grandfather     Social history:  reports that she quit smoking about 7 years ago. She started smoking about 24 years ago. She has never used smokeless tobacco. She reports that she drinks alcohol. She reports that she does not use drugs.  Medications:  Prior to Admission medications   Medication Sig Start Date End Date Taking? Authorizing Provider  Cholecalciferol (VITAMIN D3) 5000 units CAPS Take 5,000 Units by mouth every morning.   Yes Historical Provider, MD  clonazePAM (KLONOPIN) 2 MG tablet Take 1 tablet (2 mg total) by mouth at bedtime. 03/03/16  Yes  [name], MD  gabapentin (NEURONTIN) 300 MG capsule One daily at bedtime for 3 days. Increase by one tablet every third day until sleep is adequate or until 6 pills per night Patient taking differently: 600 mg at bedtime. One daily at bedtime for 3 days. Increase by one tablet every third day until sleep is adequate or until 6 pills per night 01/31/16  Yes [name], MD  levothyroxine (SYNTHROID, LEVOTHROID) 100 MCG tablet Take 100 mcg by mouth daily. 01/08/16  Yes Historical Provider, MD  mirtazapine (REMERON SOL-TAB) 45 MG disintegrating tablet Take 1 tablet (45 mg total) by  mouth at bedtime. 03/03/16  Yes [name], MD  omeprazole (PRILOSEC) 20 MG capsule Take 20 mg by mouth daily. 01/17/16  Yes Historical Provider, MD  PARoxetine (PAXIL) 30 MG tablet Take by mouth. 11/28/15  Yes Historical Provider, MD  RiTUXimab (RITUXAN IV) Inject into the vein.   Yes Historical Provider, MD      Allergies  Allergen Reactions  . Tecfidera [Dimethyl Fumarate] Itching    ROS:  Out of a complete 14 system review of symptoms, the patient complains only of the following symptoms, and all other reviewed systems are negative.  Fatigue Birthmarks Incontinence, constipation Urination problems, urinary incontinence Easy bruising, easy bleeding Feeling cold Joint pain, muscle cramps Weakness, depression Insomnia  Blood pressure 119/68, pulse (!) 101, height 5\' 5"  (1.651 m), weight 189 lb (85.7 kg).  Physical Exam  General: The patient is alert and cooperative at the time of the examination. The patient is moderately obese.  Eyes: Pupils are equal, round, and reactive to light. Discs are flat bilaterally.  Neck: The neck is supple, no carotid bruits are noted.  Respiratory: The respiratory examination is clear.  Cardiovascular: The cardiovascular examination reveals a regular rate and rhythm, no obvious murmurs or rubs are noted.  Skin: Extremities are without significant edema.  Neurologic Exam  Mental status: The patient is alert and oriented x 3 at the time of the examination. The patient has apparent normal recent and remote memory, with an apparently normal attention span and concentration ability.  Cranial nerves: Facial symmetry is present. There is good sensation of the face to pinprick and soft touch bilaterally. The strength of the facial muscles and the muscles to head turning and shoulder shrug are normal bilaterally. Speech is well enunciated, no aphasia or dysarthria is noted. Extraocular movements are full. Visual fields are full. The tongue is  midline, and the patient has symmetric elevation of the soft palate. No obvious hearing deficits are noted.  Motor: The motor testing reveals 5 over 5 strength of the upper extremities. Examination of the lower extremities shows 3/5 strength with hip flexion bilaterally, bilateral foot drops, and hamstring weakness on the right greater than left leg. Good symmetric motor tone is noted throughout.  Sensory: Sensory testing is intact to pinprick, soft touch, vibration sensation, and position sense on all 4 extremities. No evidence of extinction is noted.  Coordination: Cerebellar testing reveals good finger-nose-finger, but she is unable to perform heel-to-shin bilaterally.  Gait and station: Gait is associated with a circumduction pattern on the right, gait is somewhat wide-based. The patient uses a cane for ambulation. Tandem gait was not attempted. Romberg is negative. No drift is seen.  Reflexes: Deep tendon reflexes are notable for increased left basilar reflex as compared to the right, symmetric knee jerk reflexes, depressed ankle jerk reflexes. Toes are downgoing bilaterally.   Assessment/Plan:  1. Multiple sclerosis  2. Neurogenic  bladder  3. Gait disorder  I have recommended that this patient not operate a standard motor vehicle, if she wishes to drive she will need to convert to a vehicle with hand controls. The patient has been on rituximab, we may switch her to [place] which has a similar mechanism of action and has an FDA approved use with multiple sclerosis. The patient will not be able to start this new medication until at least February 2017. The patient will follow-up in about 4 months. Blood work will be done today. The patient will get a flush of her Port-A-Cath. The patient has been progressing with physical therapy, I will reorder physical therapy as recommended by her therapist.  Jill Alexanders MD 03/20/2016 2:04 PM  [facility] [address]  [place], [place] [address]  Phone [phone] Fax [phone]

## 2016-03-22 ENCOUNTER — Telehealth: Payer: Self-pay | Admitting: Neurology

## 2016-03-22 LAB — CBC WITH DIFFERENTIAL/PLATELET
BASOS: 1 %
Basophils Absolute: 0 10*3/uL (ref 0.0–0.2)
EOS (ABSOLUTE): 0.2 10*3/uL (ref 0.0–0.4)
EOS: 3 %
HEMATOCRIT: 39.1 % (ref 34.0–46.6)
Hemoglobin: 12.3 g/dL (ref 11.1–15.9)
Immature Grans (Abs): 0 10*3/uL (ref 0.0–0.1)
Immature Granulocytes: 0 %
LYMPHS ABS: 1.3 10*3/uL (ref 0.7–3.1)
Lymphs: 18 %
MCH: 30.6 pg (ref 26.6–33.0)
MCHC: 31.5 g/dL (ref 31.5–35.7)
MCV: 97 fL (ref 79–97)
MONOS ABS: 0.5 10*3/uL (ref 0.1–0.9)
Monocytes: 7 %
NEUTROS ABS: 4.9 10*3/uL (ref 1.4–7.0)
Neutrophils: 71 %
Platelets: 321 10*3/uL (ref 150–379)
RBC: 4.02 x10E6/uL (ref 3.77–5.28)
RDW: 17.8 % — AB (ref 12.3–15.4)
WBC: 6.9 10*3/uL (ref 3.4–10.8)

## 2016-03-22 LAB — COMPREHENSIVE METABOLIC PANEL
ALBUMIN: 4.2 g/dL (ref 3.5–5.5)
ALK PHOS: 94 IU/L (ref 39–117)
ALT: 22 IU/L (ref 0–32)
AST: 22 IU/L (ref 0–40)
Albumin/Globulin Ratio: 3 — ABNORMAL HIGH (ref 1.2–2.2)
BUN / CREAT RATIO: 9 (ref 9–23)
BUN: 8 mg/dL (ref 6–24)
Bilirubin Total: 0.3 mg/dL (ref 0.0–1.2)
CALCIUM: 9.4 mg/dL (ref 8.7–10.2)
CO2: 26 mmol/L (ref 18–29)
CREATININE: 0.92 mg/dL (ref 0.57–1.00)
Chloride: 105 mmol/L (ref 96–106)
GFR, EST AFRICAN AMERICAN: 90 mL/min/{1.73_m2} (ref >59)
GFR, EST NON AFRICAN AMERICAN: 78 mL/min/{1.73_m2} (ref >59)
GLOBULIN, TOTAL: 1.4 g/dL — AB (ref 1.5–4.5)
Glucose: 100 mg/dL — ABNORMAL HIGH (ref 65–99)
Potassium: 4.5 mmol/L (ref 3.5–5.2)
SODIUM: 147 mmol/L — AB (ref 134–144)
Total Protein: 5.6 g/dL — ABNORMAL LOW (ref 6.0–8.5)

## 2016-03-22 LAB — EBV, CHRONIC/ACTIVE INFECTION
EBV Early Antigen Ab, IgG: <9 U/mL (ref 0.0–8.9)
EBV NA IgG: 72.6 U/mL — ABNORMAL HIGH (ref 0.0–17.9)
EBV VCA IgG: 292 U/mL — ABNORMAL HIGH (ref 0.0–17.9)

## 2016-03-22 LAB — QUANTIFERON IN TUBE
QFT TB AG MINUS NIL VALUE: 0.01 IU/mL
QUANTIFERON MITOGEN VALUE: 8.79 [IU]/mL
QUANTIFERON TB AG VALUE: 0.03 IU/mL
QUANTIFERON TB GOLD: NEGATIVE
Quantiferon Nil Value: 0.02 IU/mL

## 2016-03-22 LAB — QUANTIFERON TB GOLD ASSAY (BLOOD)

## 2016-03-22 LAB — HEPATITIS B SURFACE ANTIGEN: HEP B S AG: NEGATIVE

## 2016-03-22 LAB — HEPATITIS C ANTIBODY

## 2016-03-22 LAB — HEPATITIS B CORE ANTIBODY, TOTAL: HEP B C TOTAL AB: NEGATIVE

## 2016-03-22 LAB — VARICELLA ZOSTER ANTIBODY, IGG: VARICELLA: 270 {index} (ref >165)

## 2016-03-22 NOTE — Telephone Encounter (Signed)
I called patient. The chemistry panel shows a slightly high sodium level, slightly low protein level. The CBC is unremarkable, rest of blood work is relative the unremarkable, the patient has evidence of prior exposure to Epstein-Barr virus, no active infection.  The JC virus antibody panel is still pending.  We are working on getting the patient on Ocrevus, the patient is to call me if she has not heard anything in 2 weeks.

## 2016-03-28 ENCOUNTER — Telehealth: Payer: Self-pay | Admitting: Neurology

## 2016-03-28 MED ORDER — DEXAMETHASONE 2 MG PO TABS: ORAL_TABLET | ORAL | 0 refills | Status: DC

## 2016-03-28 NOTE — Telephone Encounter (Signed)
Pt was seen in office last week for follow-up of MS. PT was ordered and plans to start pt on Ocrevus. Over the past few days, she has been having increased difficulty w/ mobility. Awaiting JCV antibody results.

## 2016-03-28 NOTE — Telephone Encounter (Signed)
I called patient. The patient has had a mild change in her ability to ambulate over the last 2 days. She is having to rely on her walker more. She denies any fevers, chills, symptoms of a bladder infection. No change in bladder function has been noted. She has had no change in sensation. Her right leg is weaker than the left.  The patient is already in physical therapy. We will try short course of dexamethasone. She will call me if any issues arise.  She is being set up for Ocrevus that should be started in February 2018.

## 2016-03-28 NOTE — Telephone Encounter (Signed)
Patient is calling stating she thinks she is having an MS flare up. Since last Saturday she has had trouble walking and moving. Please call and discuss.

## 2016-03-28 NOTE — Addendum Note (Signed)
Addended by: Margette Fast on: 03/28/2016 11:20 AM   Modules accepted: Orders

## 2016-03-29 ENCOUNTER — Telehealth: Payer: Self-pay | Admitting: Neurology

## 2016-03-29 NOTE — Telephone Encounter (Signed)
JC virus antibody level was negative.

## 2016-04-03 DIAGNOSIS — R2681 Unsteadiness on feet: Secondary | ICD-10-CM | POA: Diagnosis not present

## 2016-04-03 DIAGNOSIS — G35 Multiple sclerosis: Secondary | ICD-10-CM | POA: Diagnosis not present

## 2016-04-04 DIAGNOSIS — G35 Multiple sclerosis: Secondary | ICD-10-CM | POA: Diagnosis not present

## 2016-04-04 DIAGNOSIS — R2681 Unsteadiness on feet: Secondary | ICD-10-CM | POA: Diagnosis not present

## 2016-04-05 ENCOUNTER — Ambulatory Visit (INDEPENDENT_AMBULATORY_CARE_PROVIDER_SITE_OTHER): Payer: Commercial Managed Care - HMO | Admitting: Psychology

## 2016-04-05 DIAGNOSIS — F4321 Adjustment disorder with depressed mood: Secondary | ICD-10-CM | POA: Diagnosis not present

## 2016-04-06 DIAGNOSIS — R2681 Unsteadiness on feet: Secondary | ICD-10-CM | POA: Diagnosis not present

## 2016-04-06 DIAGNOSIS — G35 Multiple sclerosis: Secondary | ICD-10-CM | POA: Diagnosis not present

## 2016-04-12 DIAGNOSIS — R2681 Unsteadiness on feet: Secondary | ICD-10-CM | POA: Diagnosis not present

## 2016-04-12 DIAGNOSIS — G35 Multiple sclerosis: Secondary | ICD-10-CM | POA: Diagnosis not present

## 2016-04-13 DIAGNOSIS — G35 Multiple sclerosis: Secondary | ICD-10-CM | POA: Diagnosis not present

## 2016-04-13 DIAGNOSIS — R2681 Unsteadiness on feet: Secondary | ICD-10-CM | POA: Diagnosis not present

## 2016-04-14 ENCOUNTER — Ambulatory Visit (INDEPENDENT_AMBULATORY_CARE_PROVIDER_SITE_OTHER): Payer: Commercial Managed Care - HMO | Admitting: Family Medicine

## 2016-04-14 ENCOUNTER — Encounter: Payer: Self-pay | Admitting: Family Medicine

## 2016-04-14 VITALS — BP 132/80 | HR 80 | Temp 96.9°F | Ht 65.0 in | Wt 197.0 lb

## 2016-04-14 DIAGNOSIS — E039 Hypothyroidism, unspecified: Secondary | ICD-10-CM | POA: Diagnosis not present

## 2016-04-14 DIAGNOSIS — G35 Multiple sclerosis: Secondary | ICD-10-CM

## 2016-04-14 DIAGNOSIS — G4700 Insomnia, unspecified: Secondary | ICD-10-CM

## 2016-04-14 MED ORDER — CLONAZEPAM 2 MG PO TABS: 2.0000 mg | ORAL_TABLET | Freq: Every day | ORAL | 0 refills | Status: DC

## 2016-04-14 MED ORDER — ROPINIROLE HCL 1 MG PO TABS: 1.0000 mg | ORAL_TABLET | Freq: Every day | ORAL | 3 refills | Status: DC

## 2016-04-14 MED ORDER — MIRTAZAPINE 45 MG PO TBDP: 45.0000 mg | ORAL_TABLET | Freq: Every day | ORAL | 2 refills | Status: DC

## 2016-04-14 MED ORDER — CLONAZEPAM 2 MG PO TABS: 2.0000 mg | ORAL_TABLET | Freq: Every day | ORAL | 2 refills | Status: DC

## 2016-04-14 NOTE — Progress Notes (Signed)
Subjective:  Patient ID: Amber Williams, female    DOB: [birth year]  Age: 40 y.o. MRN: [id number]  CC: Insomnia (pt here today following up for insomnia and feels the klonopin and mirtazapine is helping and she is doing much better)   HPI Amber Williams presents for insomnia tx helping, however, She will occasionally wake up during the night. At that time she will take a gabapentin. She is never increased it past 1-2 tablets because of the sense of having to take so many pills. She says that she'll read her phone or TB. Couple of hours later she'll go to sleep again. No serotonin syndrome like symptoms reported.   History Amber Williams has a past medical history of Allergy; Arthritis; Blood transfusion without reported diagnosis; Depression; DVT (deep venous thrombosis) ([place]) (2013); Hyperlipidemia; Hypertension; Neuromuscular disorder ([place]); Thyroid disease; and Ulcer ([place]).   She has a past surgical history that includes Colon surgery; Gastric bypass (2010); Interstim Implant placement (2014); and Portacath placement.   Her family history includes Breast cancer in her mother; Cancer in her mother; Diabetes in her father, maternal grandmother, and paternal grandmother; Heart disease in her maternal grandfather, maternal grandmother, and paternal grandfather; Hypertension in her father and sister; Miscarriages / [name] in her sister; Stroke in her paternal grandfather.She reports that she quit smoking about 7 years ago. She started smoking about 24 years ago. She has never used smokeless tobacco. She reports that she drinks alcohol. She reports that she does not use drugs.    ROS Review of Systems  Constitutional: Negative for activity change, appetite change and fever.  HENT: Negative for congestion, rhinorrhea and sore throat.   Eyes: Negative for visual disturbance.  Respiratory: Negative for cough and shortness of breath.   Cardiovascular: Negative for chest pain and palpitations.    Gastrointestinal: Negative for abdominal pain, diarrhea and nausea.  Genitourinary: Negative for dysuria.  Musculoskeletal: Negative for arthralgias and myalgias.    Objective:  BP 132/80   Pulse 80   Temp (!) 96.9 F (36.1 C) (Oral)   Ht 5\' 5"  (1.651 m)   Wt 197 lb (89.4 kg)   BMI 32.78 kg/m   BP Readings from Last 3 Encounters:  04/14/16 132/80  03/20/16 119/68  03/04/16 132/60    Wt Readings from Last 3 Encounters:  04/14/16 197 lb (89.4 kg)  03/20/16 189 lb (85.7 kg)  03/03/16 189 lb 4 oz (85.8 kg)     Physical Exam  Constitutional: She is oriented to person, place, and time. She appears well-developed and well-nourished. No distress.  HENT:  Head: Normocephalic and atraumatic.  Eyes: Conjunctivae are normal. Pupils are equal, round, and reactive to light.  Neck: Normal range of motion. Neck supple. No thyromegaly present.  Cardiovascular: Normal rate, regular rhythm and normal heart sounds.   No murmur heard. Pulmonary/Chest: Effort normal and breath sounds normal. No respiratory distress. She has no wheezes. She has no rales.  Abdominal: Soft. Bowel sounds are normal. She exhibits no distension. There is no tenderness.  Musculoskeletal: Normal range of motion.  Lymphadenopathy:    She has no cervical adenopathy.  Neurological: She is alert and oriented to person, place, and time.  Skin: Skin is warm and dry.  Psychiatric: She has a normal mood and affect. Her behavior is normal. Judgment and thought content normal.     Lab Results  Component Value Date   WBC 6.9 03/20/2016   HCT 39.1 03/20/2016   PLT 321 03/20/2016  GLUCOSE 100 (H) 03/20/2016   ALT 22 03/20/2016   AST 22 03/20/2016   NA 147 (H) 03/20/2016   K 4.5 03/20/2016   CL 105 03/20/2016   CREATININE 0.92 03/20/2016   BUN 8 03/20/2016   CO2 26 03/20/2016   TSH 0.923 01/31/2016    Ct Head Wo Contrast  Result Date: 03/04/2016 CLINICAL DATA:  Fall yesterday.  Right height bruising. EXAM:  CT HEAD WITHOUT CONTRAST CT MAXILLOFACIAL WITHOUT CONTRAST TECHNIQUE: Multidetector CT imaging of the head and cervical spine was performed following the standard protocol without intravenous contrast. Multiplanar CT image reconstructions of the cervical spine were also generated. COMPARISON:  None. FINDINGS: CT HEAD FINDINGS Brain: No evidence of acute infarction, hemorrhage, hydrocephalus, extra-axial collection or mass lesion/mass effect. Vascular: No hyperdense vessel or unexpected calcification. Skull: Normal. Negative for fracture or focal lesion. Sinuses/Orbits: Mild right maxillary sinus mucosal thickening. Remaining visualized sinuses and mastoid air cells are clear. Left preseptal periorbital soft tissue swelling. Globes and orbits otherwise unremarkable. Other: None. CT MAXILLOFACIAL FINDINGS Osseous structures: No fracture. No osteoblastic or osteolytic lesions. Globes and orbits: There is left preseptal periorbital soft tissue swelling. The left globe is unremarkable. Normal postseptal left orbit. Normal right globe and orbit. Sinuses/mastoid air cells: Moderate right maxillary sinus mucosal thickening. Remaining sinuses are clear. Clear mastoid air cells and middle ear cavities. Soft tissues: No other soft tissue abnormality. No soft tissue mass or adenopathy. Other: None IMPRESSION: HEAD CT:  No acute intracranial abnormalities.  No skull fracture. MAXILLOFACIAL CT: Left periorbital preseptal soft tissue swelling. No injury to the left globe or postseptal orbit. No fractures. Electronically Signed   By: Lajean Manes M.D.   On: 03/04/2016 15:54   Ct Maxillofacial Wo Contrast  Result Date: 03/04/2016 CLINICAL DATA:  Fall yesterday.  Right height bruising. EXAM: CT HEAD WITHOUT CONTRAST CT MAXILLOFACIAL WITHOUT CONTRAST TECHNIQUE: Multidetector CT imaging of the head and cervical spine was performed following the standard protocol without intravenous contrast. Multiplanar CT image reconstructions of  the cervical spine were also generated. COMPARISON:  None. FINDINGS: CT HEAD FINDINGS Brain: No evidence of acute infarction, hemorrhage, hydrocephalus, extra-axial collection or mass lesion/mass effect. Vascular: No hyperdense vessel or unexpected calcification. Skull: Normal. Negative for fracture or focal lesion. Sinuses/Orbits: Mild right maxillary sinus mucosal thickening. Remaining visualized sinuses and mastoid air cells are clear. Left preseptal periorbital soft tissue swelling. Globes and orbits otherwise unremarkable. Other: None. CT MAXILLOFACIAL FINDINGS Osseous structures: No fracture. No osteoblastic or osteolytic lesions. Globes and orbits: There is left preseptal periorbital soft tissue swelling. The left globe is unremarkable. Normal postseptal left orbit. Normal right globe and orbit. Sinuses/mastoid air cells: Moderate right maxillary sinus mucosal thickening. Remaining sinuses are clear. Clear mastoid air cells and middle ear cavities. Soft tissues: No other soft tissue abnormality. No soft tissue mass or adenopathy. Other: None IMPRESSION: HEAD CT:  No acute intracranial abnormalities.  No skull fracture. MAXILLOFACIAL CT: Left periorbital preseptal soft tissue swelling. No injury to the left globe or postseptal orbit. No fractures. Electronically Signed   By: Lajean Manes M.D.   On: 03/04/2016 15:54    Assessment & Plan:   Kindyl was seen today for insomnia.  Diagnoses and all orders for this visit:  Hypothyroidism, unspecified type  Multiple sclerosis ([place])  Insomnia, unspecified type  Other orders -     Discontinue: clonazePAM (KLONOPIN) 2 MG tablet; Take 1 tablet (2 mg total) by mouth at bedtime. -     rOPINIRole (REQUIP)  1 MG tablet; Take 1 tablet (1 mg total) by mouth daily with supper. -     mirtazapine (REMERON SOL-TAB) 45 MG disintegrating tablet; Take 1 tablet (45 mg total) by mouth at bedtime. -     clonazePAM (KLONOPIN) 2 MG tablet; Take 1 tablet (2 mg total) by  mouth at bedtime.     I have discontinued Ms. Cicco RiTUXimab (RITUXAN IV) and gabapentin. I am also having her start on rOPINIRole. Additionally, I am having her maintain her levothyroxine, omeprazole, Vitamin D3, PARoxetine, dexamethasone, mirtazapine, and clonazePAM.  Meds ordered this encounter  Medications  . DISCONTD: clonazePAM (KLONOPIN) 2 MG tablet    Sig: Take 1 tablet (2 mg total) by mouth at bedtime.    Dispense:  30 tablet    Refill:  0  . rOPINIRole (REQUIP) 1 MG tablet    Sig: Take 1 tablet (1 mg total) by mouth daily with supper.    Dispense:  30 tablet    Refill:  3  . mirtazapine (REMERON SOL-TAB) 45 MG disintegrating tablet    Sig: Take 1 tablet (45 mg total) by mouth at bedtime.    Dispense:  30 tablet    Refill:  2  . clonazePAM (KLONOPIN) 2 MG tablet    Sig: Take 1 tablet (2 mg total) by mouth at bedtime.    Dispense:  30 tablet    Refill:  2     Follow-up: Return in about 3 months (around 07/13/2016).  [name], M.D.

## 2016-04-17 DIAGNOSIS — R2681 Unsteadiness on feet: Secondary | ICD-10-CM | POA: Diagnosis not present

## 2016-04-17 DIAGNOSIS — G35 Multiple sclerosis: Secondary | ICD-10-CM | POA: Diagnosis not present

## 2016-04-26 DIAGNOSIS — G35 Multiple sclerosis: Secondary | ICD-10-CM | POA: Diagnosis not present

## 2016-04-26 DIAGNOSIS — R2681 Unsteadiness on feet: Secondary | ICD-10-CM | POA: Diagnosis not present

## 2016-05-05 ENCOUNTER — Ambulatory Visit: Payer: Self-pay | Admitting: Psychology

## 2016-05-09 ENCOUNTER — Other Ambulatory Visit: Payer: Self-pay

## 2016-05-09 MED ORDER — ROPINIROLE HCL 1 MG PO TABS: 1.0000 mg | ORAL_TABLET | Freq: Every day | ORAL | 0 refills | Status: DC

## 2016-05-09 MED ORDER — PAROXETINE HCL 30 MG PO TABS: 30.0000 mg | ORAL_TABLET | Freq: Every day | ORAL | 0 refills | Status: DC

## 2016-05-30 ENCOUNTER — Telehealth: Payer: Self-pay | Admitting: Neurology

## 2016-05-30 NOTE — Telephone Encounter (Signed)
Spoke to patient she is going to fax me proof of income so I can submit for patient assistance. Ocrevus.

## 2016-06-01 NOTE — Telephone Encounter (Signed)
Fax Change Ocrevus [phone] - telephone [phone].

## 2016-06-01 NOTE — Telephone Encounter (Signed)
All documents have been submitted Ocrevus fax [phone].

## 2016-06-02 ENCOUNTER — Telehealth: Payer: Self-pay

## 2016-06-02 DIAGNOSIS — Z0189 Encounter for other specified special examinations: Secondary | ICD-10-CM

## 2016-06-02 NOTE — Telephone Encounter (Signed)
[employer] Disability forms completed, signed and returned to medical records for payment/processing. Pt is permanently disabled and unable to work.

## 2016-06-21 ENCOUNTER — Encounter: Payer: Self-pay | Admitting: *Deleted

## 2016-06-28 ENCOUNTER — Encounter: Payer: Managed Care, Other (non HMO) | Admitting: *Deleted

## 2016-07-10 ENCOUNTER — Encounter: Payer: Commercial Managed Care - HMO | Admitting: *Deleted

## 2016-07-12 DIAGNOSIS — G35 Multiple sclerosis: Secondary | ICD-10-CM | POA: Diagnosis not present

## 2016-07-14 ENCOUNTER — Ambulatory Visit (INDEPENDENT_AMBULATORY_CARE_PROVIDER_SITE_OTHER): Payer: Medicare HMO | Admitting: Family Medicine

## 2016-07-14 ENCOUNTER — Encounter: Payer: Self-pay | Admitting: Family Medicine

## 2016-07-14 VITALS — BP 113/64 | HR 92 | Temp 98.6°F | Ht 65.0 in | Wt 194.0 lb

## 2016-07-14 DIAGNOSIS — Z23 Encounter for immunization: Secondary | ICD-10-CM | POA: Diagnosis not present

## 2016-07-14 DIAGNOSIS — G4700 Insomnia, unspecified: Secondary | ICD-10-CM | POA: Diagnosis not present

## 2016-07-14 DIAGNOSIS — E039 Hypothyroidism, unspecified: Secondary | ICD-10-CM | POA: Diagnosis not present

## 2016-07-14 DIAGNOSIS — G35 Multiple sclerosis: Secondary | ICD-10-CM | POA: Diagnosis not present

## 2016-07-14 MED ORDER — MIRTAZAPINE 30 MG PO TBDP: 30.0000 mg | ORAL_TABLET | Freq: Every day | ORAL | 5 refills | Status: DC

## 2016-07-14 NOTE — Progress Notes (Signed)
Subjective:  Patient ID: Amber Williams, female    DOB: 16-Aug-1975  Age: 41 y.o. MRN: [id number]  CC: Insomnia (pt here today because she wants to discuss her klonopin and sleeping. She is having trouble staying asleep at night.)   HPI [name] presents for Problems with maintaining sleep. She'll go to sleep well. The Klonopin and ropinirole help well with that. Unfortunately after 3 or 4 hours she is awake and does not get back to sleep. She states she did not try the mirtazapine just because of the use of the other medications. Of note is that she started this week on Ocrevus for her multiple sclerosis.   History Katira has a past medical history of Allergy; Arthritis; Blood transfusion without reported diagnosis; Depression; DVT (deep venous thrombosis) ([place]) (2013); Hyperlipidemia; Hypertension; Neuromuscular disorder ([place]); Thyroid disease; and Ulcer ([place]).   She has a past surgical history that includes Colon surgery; Gastric bypass (2010); Interstim Implant placement (2014); and Portacath placement.   Her family history includes Breast cancer in her mother; Cancer in her mother; Diabetes in her father, maternal grandmother, and paternal grandmother; Heart disease in her maternal grandfather, maternal grandmother, and paternal grandfather; Hypertension in her father and sister; Miscarriages / [name] in her sister; Stroke in her paternal grandfather.She reports that she quit smoking about 7 years ago. She started smoking about 25 years ago. She has never used smokeless tobacco. She reports that she drinks alcohol. She reports that she does not use drugs.    ROS Review of Systems  Constitutional: Negative for activity change, appetite change and fever.  HENT: Negative for congestion, rhinorrhea and sore throat.   Eyes: Negative for visual disturbance.  Respiratory: Negative for cough and shortness of breath.   Cardiovascular: Negative for chest pain and palpitations.    Gastrointestinal: Negative for abdominal pain, diarrhea and nausea.  Genitourinary: Negative for dysuria.  Musculoskeletal: Positive for arthralgias and myalgias.  Psychiatric/Behavioral: Positive for sleep disturbance.    Objective:  BP 113/64   Pulse 92   Temp 98.6 F (37 C) (Oral)   Ht 5' 5"  (8.676 m)   Wt 194 lb (88 kg)   BMI 32.28 kg/m   BP Readings from Last 3 Encounters:  07/14/16 113/64  04/14/16 132/80  03/20/16 119/68    Wt Readings from Last 3 Encounters:  07/14/16 194 lb (88 kg)  04/14/16 197 lb (89.4 kg)  03/20/16 189 lb (85.7 kg)     Physical Exam  Constitutional: She is oriented to person, place, and time. She appears well-developed and well-nourished. No distress.  Cardiovascular: Normal rate and regular rhythm.   Pulmonary/Chest: Breath sounds normal.  Neurological: She is alert and oriented to person, place, and time.  Skin: Skin is warm and dry.  Psychiatric: She has a normal mood and affect.      Assessment & Plan:   Tarnisha was seen today for insomnia.  Diagnoses and all orders for this visit:  Hypothyroidism, unspecified type -     TSH + free T4 -     CBC with Differential/Platelet -     CMP14+EGFR  Insomnia, unspecified type -     CBC with Differential/Platelet -     CMP14+EGFR  Multiple sclerosis (HCC) -     CBC with Differential/Platelet -     CMP14+EGFR  Other orders -     mirtazapine (REMERON SOL-TAB) 30 MG disintegrating tablet; Take 1 tablet (30 mg total) by mouth at bedtime.  Patient is starting an immune suppression type therapy with the [place] Dials therefore we'll go ahead with her Prevnar today.  I have discontinued Ms. Lemon dexamethasone. I have also changed her mirtazapine. Additionally, I am having her maintain her levothyroxine, omeprazole, Vitamin D3, clonazePAM, rOPINIRole, PARoxetine, and Ocrelizumab (OCREVUS IV).  Allergies as of 07/14/2016      Reactions   Tecfidera [dimethyl Fumarate] Itching       Medication List       Accurate as of 07/14/16  8:55 AM. Always use your most recent med list.          clonazePAM 2 MG tablet Commonly known as:  KLONOPIN Take 1 tablet (2 mg total) by mouth at bedtime.   levothyroxine 100 MCG tablet Commonly known as:  SYNTHROID, LEVOTHROID Take 100 mcg by mouth daily.   mirtazapine 30 MG disintegrating tablet Commonly known as:  REMERON SOL-TAB Take 1 tablet (30 mg total) by mouth at bedtime.   OCREVUS IV Inject into the vein.   omeprazole 20 MG capsule Commonly known as:  PRILOSEC Take 20 mg by mouth daily.   PARoxetine 30 MG tablet Commonly known as:  PAXIL Take 1 tablet (30 mg total) by mouth daily.   rOPINIRole 1 MG tablet Commonly known as:  REQUIP Take 1 tablet (1 mg total) by mouth daily with supper.   Vitamin D3 5000 units Caps Take 5,000 Units by mouth every morning.        Follow-up: Return in about 6 months (around 01/14/2017).  [name], M.D.

## 2016-07-14 NOTE — Addendum Note (Signed)
Addended by: Marylin Crosby on: 07/14/2016 05:39 PM   Modules accepted: Orders

## 2016-07-19 ENCOUNTER — Ambulatory Visit: Payer: Medicare Other | Admitting: Neurology

## 2016-07-20 DIAGNOSIS — Z1231 Encounter for screening mammogram for malignant neoplasm of breast: Secondary | ICD-10-CM | POA: Diagnosis not present

## 2016-07-26 ENCOUNTER — Other Ambulatory Visit: Payer: Self-pay | Admitting: Family Medicine

## 2016-07-26 DIAGNOSIS — G35 Multiple sclerosis: Secondary | ICD-10-CM | POA: Diagnosis not present

## 2016-08-03 ENCOUNTER — Encounter: Payer: Self-pay | Admitting: Neurology

## 2016-08-03 ENCOUNTER — Other Ambulatory Visit: Payer: Self-pay | Admitting: *Deleted

## 2016-08-03 ENCOUNTER — Ambulatory Visit (INDEPENDENT_AMBULATORY_CARE_PROVIDER_SITE_OTHER): Payer: Medicare HMO | Admitting: Neurology

## 2016-08-03 ENCOUNTER — Encounter: Payer: Self-pay | Admitting: *Deleted

## 2016-08-03 VITALS — BP 146/79 | HR 119 | Resp 16 | Ht 65.0 in | Wt 188.0 lb

## 2016-08-03 DIAGNOSIS — G35 Multiple sclerosis: Secondary | ICD-10-CM

## 2016-08-03 DIAGNOSIS — N319 Neuromuscular dysfunction of bladder, unspecified: Secondary | ICD-10-CM

## 2016-08-03 DIAGNOSIS — Z5181 Encounter for therapeutic drug level monitoring: Secondary | ICD-10-CM

## 2016-08-03 DIAGNOSIS — R269 Unspecified abnormalities of gait and mobility: Secondary | ICD-10-CM

## 2016-08-03 DIAGNOSIS — G35D Multiple sclerosis, unspecified: Secondary | ICD-10-CM

## 2016-08-03 DIAGNOSIS — M21371 Foot drop, right foot: Secondary | ICD-10-CM

## 2016-08-03 HISTORY — DX: Foot drop, right foot: M21.371

## 2016-08-03 HISTORY — DX: Neuromuscular dysfunction of bladder, unspecified: N31.9

## 2016-08-03 HISTORY — DX: Unspecified abnormalities of gait and mobility: R26.9

## 2016-08-03 NOTE — Progress Notes (Signed)
Pt. currently purchasing her own adult diapers from [facility].  Having difficulty affording them.  I have spoken with Kenney Houseman at [facility]. They can supply pt. with depends and handle insurance billing for her.  Order faxed to [facility] for adult diapers, #120 per month, size S/M/fim

## 2016-08-03 NOTE — Patient Instructions (Signed)
   We will get a referral to Urology and to physical therapy for a brace for the right foot.

## 2016-08-03 NOTE — Progress Notes (Signed)
Reason for visit: multiple sclerosis  Amber Williams is an 41 y.o. female  History of present illness:  Amber Williams is a 41 year old right-handed white female with a history of multiple sclerosis associated with a gait disorder and a neurogenic bladder. The patient has had episodes of falling that are usually associated when she is at home, using a cane instead of the walker. She has not had any falls with a walker. She has bilateral lower extremity weakness, right greater than left associated with hip flexor weakness and a footdrop on the right. The patient has a neurogenic bladder, she has an InterStim stimulator for the bladder. The patient claims that the stimulator is MRI compatible, she has a programming unit where she can turn off the unit or reprogram the unit if needed. The patient uses adult diapers as well. The patient has an AFO brace for the right foot, but it is a PAFO, and she rarely uses the brace. The patient still operate a motor vehicle, but she has difficulty transitioning from the accelerator the brake and vice versa. The patient has just recently started Ocrevus, she has some fatigue following the second dose, but otherwise she has tolerated this well. The patient denies any new symptoms of numbness, weakness, vision complaints, or changes in bladder function. She returns for an evaluation.  Past Medical History:  Diagnosis Date  . Allergy   . Arthritis   . Blood transfusion without reported diagnosis    2013  . Depression   . DVT (deep venous thrombosis) ([place]) 2013  . Gait abnormality 08/03/2016  . Hyperlipidemia   . Hypertension   . Neurogenic bladder 08/03/2016  . Neuromuscular disorder ([place])    dx'd with MS at age of 81  . Right foot drop 08/03/2016  . Thyroid disease   . Ulcer [facility])     Past Surgical History:  Procedure Laterality Date  . COLON SURGERY     Colonoscopy  . GASTRIC BYPASS  2010  . INTERSTIM IMPLANT PLACEMENT  2014  . PORTACATH PLACEMENT       Family History  Problem Relation Age of Onset  . Cancer Mother     Dx'd at age 73  . Breast cancer Mother   . Hypertension Father   . Diabetes Father   . Hypertension Sister   . Miscarriages / Stillbirths Sister   . Heart disease Maternal Grandmother   . Diabetes Maternal Grandmother   . Heart disease Maternal Grandfather   . Diabetes Paternal Grandmother   . Heart disease Paternal Grandfather   . Stroke Paternal Grandfather     Social history:  reports that she quit smoking about 7 years ago. She started smoking about 25 years ago. She has never used smokeless tobacco. She reports that she drinks alcohol. She reports that she does not use drugs.    Allergies  Allergen Reactions  . Tecfidera [Dimethyl Fumarate] Itching    Medications:  Prior to Admission medications   Medication Sig Start Date End Date Taking? Authorizing Provider  Cholecalciferol (VITAMIN D3) 5000 units CAPS Take 5,000 Units by mouth every morning.   Yes Historical Provider, MD  levothyroxine (SYNTHROID, LEVOTHROID) 100 MCG tablet Take 100 mcg by mouth daily. 01/08/16  Yes Historical Provider, MD  mirtazapine (REMERON SOL-TAB) 30 MG disintegrating tablet Take 1 tablet (30 mg total) by mouth at bedtime. 07/14/16  Yes [name], MD  Ocrelizumab (OCREVUS IV) Inject into the vein.   Yes Historical Provider, MD  omeprazole (  PRILOSEC) 20 MG capsule Take 20 mg by mouth daily. 01/17/16  Yes Historical Provider, MD  PARoxetine (PAXIL) 30 MG tablet TAKE 1 TABLET (30 MG TOTAL) BY MOUTH DAILY. 07/27/16  Yes [name], MD  rOPINIRole (REQUIP) 1 MG tablet Take 1 tablet (1 mg total) by mouth daily with supper. 05/09/16  Yes [name], MD    ROS:  Out of a complete 14 system review of symptoms, the patient complains only of the following symptoms, and all other reviewed systems are negative.  Insomnia Incontinence of the bladder, frequency of urination Muscle cramps, walking difficulty Weakness  Blood  pressure (!) 146/79, pulse (!) 119, resp. rate 16, height 5\' 5"  (1.651 m), weight 188 lb (85.3 kg).  Physical Exam  General: The patient is alert and cooperative at the time of the examination.  Skin: No significant peripheral edema is noted.   Neurologic Exam  Mental status: The patient is alert and oriented x 3 at the time of the examination. The patient has apparent normal recent and remote memory, with an apparently normal attention span and concentration ability.   Cranial nerves: Facial symmetry is present. Speech is normal, no aphasia or dysarthria is noted. Extraocular movements are full. Visual fields are full. Pupils are equal, round, and reactive to light. Discs are flat bilaterally.  Motor: The patient has good strength in the upper extremities. With the lower extremities, the patient has 3/5 strength with hip flexion bilaterally, the patient has 3/5 strength with flexion at the knee on the right, 4 minus/5 with extension of the knee on the right. The patient has 4 minus/5 strength with knee extension and flexion on the left. The patient has a footdrop on the right, not present on the left. The patient has better strength with plantar flexion of both feet.  Sensory examination: Soft touch sensation is symmetric on the face and arms, decreased soft sensation on the right leg as compared to the left.  Coordination: The patient has good finger-nose-finger bilaterally. The patient has difficulty performing heel-to-shin on both sides.  Gait and station: The patient has the ability to and leg with a walker, the patient has a circumduction gait with the right leg. Gait is wide-based. Tandem gait was not attempted. Romberg is negative. No drift is seen.  Reflexes: Deep tendon reflexes are symmetric.   Assessment/Plan:  1. Multiple sclerosis  2. Gait disorder  3. Neurogenic bladder  4. Right foot drop  The patient is not safe with driving at this time. It may be possible for  her to operate a motor vehicle more safely if she has a spring-AFO for the right foot that would allow her to transition from the accelerator to the brake more easily. I will refer her to a physical therapist for an evaluation. The patient will be referred to [facility] for the neurogenic bladder and for management of the InterStim stimulator. The patient will have blood work today, she will follow-up in 3 months. She has received the Ocrevus doses, the next dose will be in 6 months. MRI of the brain will be done around August 2018. The last MRI of the head and neck were last done at [facility] in August 2017.   Jill Alexanders MD 08/03/2016 7:56 AM  [facility] [address] [place], [place] [address]  Phone [phone] Fax [phone]

## 2016-08-03 NOTE — Progress Notes (Signed)
FM,RN faxed DME order for adult diapers, size S/M, 120 per 30 days to [facility]. Included most recent office note, demographic sheet, insurance information. Fax: [phone]. Received confirmation.

## 2016-08-04 ENCOUNTER — Telehealth: Payer: Self-pay | Admitting: Neurology

## 2016-08-04 LAB — CBC WITH DIFFERENTIAL/PLATELET
Basophils Absolute: 0 10*3/uL (ref 0.0–0.2)
Basos: 0 %
EOS (ABSOLUTE): 0.1 10*3/uL (ref 0.0–0.4)
EOS: 1 %
HEMATOCRIT: 39.6 % (ref 34.0–46.6)
Hemoglobin: 12.2 g/dL (ref 11.1–15.9)
Immature Grans (Abs): 0 10*3/uL (ref 0.0–0.1)
Immature Granulocytes: 0 %
LYMPHS ABS: 0.9 10*3/uL (ref 0.7–3.1)
Lymphs: 9 %
MCH: 28.6 pg (ref 26.6–33.0)
MCHC: 30.8 g/dL — AB (ref 31.5–35.7)
MCV: 93 fL (ref 79–97)
MONOS ABS: 1.1 10*3/uL — AB (ref 0.1–0.9)
Monocytes: 11 %
NEUTROS PCT: 79 %
Neutrophils Absolute: 8.1 10*3/uL — ABNORMAL HIGH (ref 1.4–7.0)
PLATELETS: 304 10*3/uL (ref 150–379)
RBC: 4.26 x10E6/uL (ref 3.77–5.28)
RDW: 16 % — AB (ref 12.3–15.4)
WBC: 10.2 10*3/uL (ref 3.4–10.8)

## 2016-08-04 LAB — COMPREHENSIVE METABOLIC PANEL
A/G RATIO: 3 — AB (ref 1.2–2.2)
ALBUMIN: 4.2 g/dL (ref 3.5–5.5)
ALK PHOS: 100 IU/L (ref 39–117)
ALT: 13 IU/L (ref 0–32)
AST: 18 IU/L (ref 0–40)
BUN / CREAT RATIO: 12 (ref 9–23)
BUN: 12 mg/dL (ref 6–24)
Bilirubin Total: 0.6 mg/dL (ref 0.0–1.2)
CALCIUM: 9.5 mg/dL (ref 8.7–10.2)
CHLORIDE: 102 mmol/L (ref 96–106)
CO2: 24 mmol/L (ref 18–29)
Creatinine, Ser: 0.97 mg/dL (ref 0.57–1.00)
GFR calc Af Amer: 84 mL/min/{1.73_m2} (ref >59)
GFR calc non Af Amer: 73 mL/min/{1.73_m2} (ref >59)
GLOBULIN, TOTAL: 1.4 g/dL — AB (ref 1.5–4.5)
Glucose: 77 mg/dL (ref 65–99)
POTASSIUM: 3.7 mmol/L (ref 3.5–5.2)
Sodium: 143 mmol/L (ref 134–144)
TOTAL PROTEIN: 5.6 g/dL — AB (ref 6.0–8.5)

## 2016-08-04 LAB — TSH: TSH: 1.04 u[IU]/mL (ref 0.450–4.500)

## 2016-08-04 NOTE — Telephone Encounter (Signed)
Noted thank you

## 2016-08-04 NOTE — Telephone Encounter (Signed)
Baxter Flattery with Dr Nicki Reaper [place] office [phone] called to advise the pt's appt has been scheduled for 5/10 @ 10:15  FYI

## 2016-08-16 ENCOUNTER — Encounter: Payer: Self-pay | Admitting: Physical Therapy

## 2016-08-16 ENCOUNTER — Ambulatory Visit: Payer: Medicare HMO | Attending: Neurology | Admitting: Physical Therapy

## 2016-08-16 DIAGNOSIS — R296 Repeated falls: Secondary | ICD-10-CM | POA: Insufficient documentation

## 2016-08-16 DIAGNOSIS — M21371 Foot drop, right foot: Secondary | ICD-10-CM | POA: Diagnosis not present

## 2016-08-16 DIAGNOSIS — R2681 Unsteadiness on feet: Secondary | ICD-10-CM

## 2016-08-16 DIAGNOSIS — R2689 Other abnormalities of gait and mobility: Secondary | ICD-10-CM | POA: Diagnosis not present

## 2016-08-16 NOTE — Therapy (Signed)
[facility] [address] [place], [place], [address] Phone: [phone]   Fax:  [phone]  Physical Therapy Evaluation  Patient Details  Name: Amber Williams MRN: [id number] Date of Birth: [birth year] Referring Provider: Kathrynn Ducking, MD  Encounter Date: 08/16/2016      PT End of Session - 08/16/16 1707    Visit Number 1   Number of Visits 5   Date for PT Re-Evaluation 09/15/16   Authorization Type [employer] HMO; $45 copay; G code and progress note every 10th visit   PT Start Time 1105   PT Stop Time 1153   PT Time Calculation (min) 48 min   Activity Tolerance Patient tolerated treatment well   Behavior During Therapy [facility] for tasks assessed/performed      Past Medical History:  Diagnosis Date  . Allergy   . Arthritis   . Blood transfusion without reported diagnosis    2013  . Depression   . DVT (deep venous thrombosis) ([place]) 2013  . Gait abnormality 08/03/2016  . Hyperlipidemia   . Hypertension   . Neurogenic bladder 08/03/2016  . Neuromuscular disorder ([place])    dx'd with MS at age of 54  . Right foot drop 08/03/2016  . Thyroid disease   . Ulcer     Past Surgical History:  Procedure Laterality Date  . COLON SURGERY     Colonoscopy  . GASTRIC BYPASS  2010  . INTERSTIM IMPLANT PLACEMENT  2014  . PORTACATH PLACEMENT      There were no vitals filed for this visit.       Subjective Assessment - 08/16/16 1111    Subjective Pt presents to OPPT after visit with neurologist due to falls; pt reports having 1 fall/week due to LOB when not using RW.  No injuries with falls.  Denies dizziness.  Does report R foot catching/dragging at times.  Uses 4 wheeled RW primarily and uses walking stick out in the community; does use RW intermittently in community depending on activity.  Is still driving short distances.  Pt has custom molded, hinged AFO but has not worn it in years due to difficulty with donning and  unable to drive.   Patient is accompained by: Family member   Pertinent History MS, repeated falls   Limitations Walking   Patient Stated Goals Prevent future falls   Currently in Pain? No/denies            [facility] PT Assessment - 08/16/16 1118      Assessment   Medical Diagnosis MS, falls, R foot drop   Referring Provider Kathrynn Ducking, MD   Hand Dominance Right   Next MD Visit July 12th, 2018   Prior Therapy Yes-for balance due to MS     Precautions   Precautions Fall     Balance Screen   Has the patient fallen in the past 6 months Yes   How many times? 1 a week   Has the patient had a decrease in activity level because of a fear of falling?  No   Is the patient reluctant to leave their home because of a fear of falling?  No     Home Environment   Living Environment Private residence   Living Arrangements Alone   Available Help at Discharge Family   Type of [place] to enter   Entrance Stairs-Number of Steps 1   [place] One  level   [place] - 4 wheels;Other (comment)  walking stick   Additional Comments when she goes to parent's house has to go up 8-10 to access main level     Prior Function   Level of Independence Independent with community mobility with device;Independent with household mobility with device;Independent with transfers;Independent with gait;Requires assistive device for independence   Vocation Student  will start community college in Aug, 1x/week     Observation/Other Assessments   Focus on Therapeutic Outcomes (FOTO)  did not complete     Sensation   Light Touch Appears Intact     ROM / Strength   AROM / PROM / Strength Strength     Strength   Overall Strength Deficits   Overall Strength Comments L 2/5 hip flexion, 5/5 knee extension, 4/5 ankle DF and knee flexion; RLE: 1/5 hip flexion, knee extension 4/5, ankle DF and eversion 2/5, knee flexion 2/5      Ambulation/Gait   Ambulation/Gait Yes   Ambulation/Gait Assistance 6: Modified independent (Device/Increase time)   Ambulation Distance (Feet) 150 Feet   Assistive device 4-wheeled walker;Other (Comment)  walking stick   Gait Pattern Step-through pattern;Decreased step length - right;Decreased step length - left;Decreased stride length;Decreased hip/knee flexion - right;Decreased hip/knee flexion - left;Decreased dorsiflexion - right;Right foot flat;Right genu recurvatum;Trendelenburg;Decreased trunk rotation;Right hip hike;Poor foot clearance - right   Ambulation Surface Level;Indoor   Gait velocity 20.47 seconds or 1.25ft/sec with rollator and 16.22 seconds or 2.50ft/sec with walking stick    Stairs Yes   Stairs Assistance 6: Modified independent (Device/Increase time);Other (comment)   Stair Management Technique Two rails;Step to pattern;Forwards  circumducts LE to advance, R genu recurvatum   Number of Stairs 4   Height of Stairs 6     Standardized Balance Assessment   Standardized Balance Assessment 10 meter walk test   10 Meter Walk 20.47 seconds or 1.47ft/sec with rollator and 16.22 seconds or 2.54ft/sec with walking stick                            PT Education - 08/16/16 1706    Education provided Yes   Education Details proper height of walking stick; clinical findings, PT goals and POC based on finances and pro-bono clinics   Person(s) Educated Patient;Parent(s)   Methods Explanation;Demonstration   Comprehension Verbalized understanding             PT Long Term Goals - 08/16/16 1710      PT LONG TERM GOAL #1   Title (TARGET DATE FOR ALL LTG 09/15/16) Pt will demonstrate independence with balance and strengthening HEP   Time 4   Period Weeks   Status New     PT LONG TERM GOAL #2   Title DGI goal TBD   Baseline TBD     PT LONG TERM GOAL #3   Title Pt will improve gait velocity with appropriate AFO and cane to >2.62 ft/sec and rollator to >2.0  ft/sec to decrease falls risk with community ambulation   Baseline 1.6ft/sec with rollator, 2.0 ft/sec with walking stick, no AFO     PT LONG TERM GOAL #4   Title Pt will ambulate x 500' over outdoor surfaces (paved, grassy, curb, etc) with LRAD, and up/down 12 stairs with 2 rails appropriate AFO and Mod I with no LOB   Time 4   Period Weeks   Status New     PT LONG TERM GOAL #  5   Title Pt will demonstrate independent donning of AFO and skin check of RLE following wear of AFO >4 hours   Time 4   Period Weeks   Status New               Plan - 09-11-2016 1715    Clinical Impression Statement Pt is a 41 year old female presenting to OPPT neuro for medium complexity PT evaluation for repeated falls, gait abnormality and R foot drop due to MS. Pt's PMH significant for the following: MS, depression, thyroid disease. The following deficits were noted during pt's exam: R hemiparesis, R foot drop, impaired strength, impaired balance and abnormal gait. Pt's gait speed indicates is at risk for recurrent falls. Pt would benefit from skilled PT to address these impairments and functional limitations to maximize functional mobility independence and reduce falls risk.   Rehab Potential Good   Clinical Impairments Affecting Rehab Potential long history of R hemiplegia from MS   PT Frequency 1x / week   PT Duration 4 weeks   PT Treatment/Interventions ADLs/Self Care Home Management;Electrical Stimulation;DME Instruction;Gait training;Stair training;Functional mobility training;Therapeutic activities;Therapeutic exercise;Balance training;Neuromuscular re-education;Patient/family education;Orthotic Fit/Training;Energy conservation   PT Next Visit Plan assess off the shelf AFO for R foot drop; assess DGI and set goal; NMES to R ankle DF or hip flexors, HEP for bilat LE strengthening   Recommended Other Services pro-bono PT clinics when session finish here due to high co-pay   Consulted and Agree with Plan  of Care Patient      Patient will benefit from skilled therapeutic intervention in order to improve the following deficits and impairments:  Abnormal gait, Decreased balance, Decreased strength, Difficulty walking  Visit Diagnosis: Foot drop, right  Repeated falls  Unsteadiness on feet  Other abnormalities of gait and mobility      G-Codes - 2016/09/11 1718    Functional Assessment Tool Used (Outpatient Only) gait velocity   Functional Limitation Mobility: Walking and moving around   Mobility: Walking and Moving Around Current Status (V3710) At least 40 percent but less than 60 percent impaired, limited or restricted   Mobility: Walking and Moving Around Goal Status [phone]) At least 20 percent but less than 40 percent impaired, limited or restricted       Problem List Patient Active Problem List   Diagnosis Date Noted  . Neurogenic bladder 08/03/2016  . Gait abnormality 08/03/2016  . Right foot drop 08/03/2016  . Hypothyroidism 01/31/2016  . Insomnia 01/31/2016  . Multiple sclerosis ([place]) 01/31/2016  . PTSD (post-traumatic stress disorder) 01/31/2016    Raylene Everts, PT, DPT 09-11-16    9:05 PM    [facility] [address] [place], [place], [address] Phone: [phone]   Fax:  [phone]  Name: Amber Williams MRN: [id number] Date of Birth: November 27, 1975

## 2016-08-23 ENCOUNTER — Ambulatory Visit: Payer: Medicare HMO

## 2016-08-23 DIAGNOSIS — R2681 Unsteadiness on feet: Secondary | ICD-10-CM | POA: Diagnosis not present

## 2016-08-23 DIAGNOSIS — M21371 Foot drop, right foot: Secondary | ICD-10-CM

## 2016-08-23 DIAGNOSIS — R2689 Other abnormalities of gait and mobility: Secondary | ICD-10-CM | POA: Diagnosis not present

## 2016-08-23 DIAGNOSIS — R296 Repeated falls: Secondary | ICD-10-CM | POA: Diagnosis not present

## 2016-08-23 NOTE — Therapy (Signed)
[facility] [address] [place], [place], [address] Phone: [phone]   Fax:  [phone]  Physical Therapy Treatment  Patient Details  Name: Amber Williams MRN: [id number] Date of Birth: 11-15-1975 Referring Provider: Kathrynn Ducking, MD  Encounter Date: 08/23/2016      PT End of Session - 08/23/16 1313    Visit Number 2   Number of Visits 5   Date for PT Re-Evaluation 09/15/16   Authorization Type [employer] HMO; $45 copay; G code and progress note every 10th visit   PT Start Time 1017   PT Stop Time 1056   PT Time Calculation (min) 39 min   Equipment Utilized During Treatment --  min guard to S prn   Activity Tolerance Patient tolerated treatment well   Behavior During Therapy [facility] for tasks assessed/performed      Past Medical History:  Diagnosis Date  . Allergy   . Arthritis   . Blood transfusion without reported diagnosis    2013  . Depression   . DVT (deep venous thrombosis) ([place]) 2013  . Gait abnormality 08/03/2016  . Hyperlipidemia   . Hypertension   . Neurogenic bladder 08/03/2016  . Neuromuscular disorder ([place])    dx'd with MS at age of 1  . Right foot drop 08/03/2016  . Thyroid disease   . Ulcer     Past Surgical History:  Procedure Laterality Date  . COLON SURGERY     Colonoscopy  . GASTRIC BYPASS  2010  . INTERSTIM IMPLANT PLACEMENT  2014  . PORTACATH PLACEMENT      There were no vitals filed for this visit.      Subjective Assessment - 08/23/16 1021    Subjective Pt reported one fall at [facility] on Monday, as she was walking too fast. Pt denied hitting head or other injuries.    Pertinent History MS, repeated falls   Patient Stated Goals Prevent future falls   Currently in Pain? No/denies                         [facility] Adult PT Treatment/Exercise - 08/23/16 1022      Ambulation/Gait   Ambulation/Gait Yes   Ambulation/Gait Assistance 5: Supervision;4: Min  guard   Ambulation/Gait Assistance Details Pt amb. without and with R AFO donned, several AFOs trialed and pt amb. demonstrated improve R toe clearance and decr. R genu recurvatum with R posterior [place] with 1/4" heel wedge place in shoe. Pt's balance improved with R [place] AFO. R toe off AFO did not improve toe cleance and incr. R genu recurvatum.   Ambulation Distance (Feet) 75 Feet  without AD, 115'x2 c R post. [place] AFO, x100' c toe off   Assistive device Other (Comment)  walking stick   Gait Pattern Step-through pattern;Decreased step length - right;Decreased step length - left;Decreased stride length;Decreased hip/knee flexion - right;Decreased hip/knee flexion - left;Decreased dorsiflexion - right;Right foot flat;Right genu recurvatum;Trendelenburg;Decreased trunk rotation;Right hip hike;Poor foot clearance - right   Ambulation Surface Level;Indoor     Standardized Balance Assessment   Standardized Balance Assessment Dynamic Gait Index     Dynamic Gait Index   Level Surface Moderate Impairment   Change in Gait Speed Mild Impairment   Gait with Horizontal Head Turns Mild Impairment   Gait with Vertical Head Turns Mild Impairment   Gait and Pivot Turn Moderate Impairment   Step Over Obstacle Moderate Impairment   Step Around Obstacles  Mild Impairment   Steps Moderate Impairment   Total Score 12                PT Education - 08/23/16 1312    Education provided Yes   Education Details PT discussed benefits of R AFO to improve toe clearance and decr. R genu recurvatum. Pt provided verbal auth to contact [facility] to set up appt with Gerald Stabs during PT appt's, to fit pt for AFO. PT discussed DGI results   Person(s) Educated Patient   Methods Explanation   Comprehension Verbalized understanding             PT Long Term Goals - 08/16/16 1710      PT LONG TERM GOAL #1   Title (TARGET DATE FOR ALL LTG 09/15/16) Pt will demonstrate independence with balance and  strengthening HEP   Time 4   Period Weeks   Status New     PT LONG TERM GOAL #2   Title DGI goal TBD   Baseline TBD     PT LONG TERM GOAL #3   Title Pt will improve gait velocity with appropriate AFO and cane to >2.62 ft/sec and rollator to >2.0 ft/sec to decrease falls risk with community ambulation   Baseline 1.76ft/sec with rollator, 2.0 ft/sec with walking stick, no AFO     PT LONG TERM GOAL #4   Title Pt will ambulate x 500' over outdoor surfaces (paved, grassy, curb, etc) with LRAD, and up/down 12 stairs with 2 rails appropriate AFO and Mod I with no LOB   Time 4   Period Weeks   Status New     PT LONG TERM GOAL #5   Title Pt will demonstrate independent donning of AFO and skin check of RLE following wear of AFO >4 hours   Time 4   Period Weeks   Status New               Plan - 08/23/16 1313    Clinical Impression Statement Pt demonstrated improve R toe clearance and decr. R genu recurvatum with R posterior [place]ach AFO donned, along with 1/4" heel wedge placed in R shoe. Pt's DGI score indicates pt is at risk for falls. Gerald Stabs, from [facility], contacted and will come to one of pt's next appt's to trial AFOs. Continue with POC.    Rehab Potential Good   Clinical Impairments Affecting Rehab Potential long history of R hemiplegia from MS   PT Frequency 1x / week   PT Duration 4 weeks   PT Treatment/Interventions ADLs/Self Care Home Management;Electrical Stimulation;DME Instruction;Gait training;Stair training;Functional mobility training;Therapeutic activities;Therapeutic exercise;Balance training;Neuromuscular re-education;Patient/family education;Orthotic Fit/Training;Energy conservation   PT Next Visit Plan Gait with R post. AFO and heel wedge, NMES to R ankle DF or hip flexors, HEP for bilat LE strengthening   Consulted and Agree with Plan of Care Patient      Patient will benefit from skilled therapeutic intervention in order to improve the following deficits  and impairments:  Abnormal gait, Decreased balance, Decreased strength, Difficulty walking  Visit Diagnosis: Foot drop, right  Other abnormalities of gait and mobility     Problem List Patient Active Problem List   Diagnosis Date Noted  . Neurogenic bladder 08/03/2016  . Gait abnormality 08/03/2016  . Right foot drop 08/03/2016  . Hypothyroidism 01/31/2016  . Insomnia 01/31/2016  . Multiple sclerosis ([place]) 01/31/2016  . PTSD (post-traumatic stress disorder) 01/31/2016    Lydiah Pong L 08/23/2016, 1:16 PM  [place]  [place] [address] [place], [place], [address] Phone: [phone]   Fax:  [phone]  Name: DALLAS SCORSONE MRN: [id number] Date of Birth: 07-20-1975  Geoffry Paradise, PT,DPT 08/23/16 1:16 PM Phone: [phone] Fax: [phone]

## 2016-08-31 ENCOUNTER — Ambulatory Visit: Payer: Medicare HMO | Attending: Neurology | Admitting: Physical Therapy

## 2016-08-31 ENCOUNTER — Encounter: Payer: Self-pay | Admitting: Physical Therapy

## 2016-08-31 DIAGNOSIS — M21371 Foot drop, right foot: Secondary | ICD-10-CM

## 2016-08-31 DIAGNOSIS — R2681 Unsteadiness on feet: Secondary | ICD-10-CM

## 2016-08-31 DIAGNOSIS — R2689 Other abnormalities of gait and mobility: Secondary | ICD-10-CM | POA: Diagnosis not present

## 2016-08-31 DIAGNOSIS — R296 Repeated falls: Secondary | ICD-10-CM | POA: Insufficient documentation

## 2016-08-31 NOTE — Patient Instructions (Signed)
Hamstring Curl: Standing (Single Leg)    In shoulder width stance, hold on to counter.  Bend up one knee, foot toward buttock. Repeat _12_ times per leg. Repeat with other leg.     Standing On One Leg Without Support .  Stand on one leg in neutral spine with support. Hold 10 seconds. Repeat on other leg. Do 3-4 repetitions   Side-Stepping   Walk to left side with eyes open. Take even steps, leading with same foot. Make sure each foot lifts off the floor. Repeat in opposite direction.  Hold on to countertop Repeat 4 times each direction. Do 2 sessions per day.   Copyright  VHI. All rights reserved.     AMBULATION: Walk Backward   Walk backward. Take large steps, do not drag feet.  Hold on to countertop 4 reps

## 2016-08-31 NOTE — Therapy (Signed)
[facility] [address] [place], [place], [address] Phone: [phone]   Fax:  [phone]  Physical Therapy Treatment  Patient Details  Name: Amber Williams MRN: [id number] Date of Birth: 1976/01/31 Referring Provider: Kathrynn Ducking, MD  Encounter Date: 08/31/2016      PT End of Session - 08/31/16 1459    Visit Number 3   Number of Visits 5   Date for PT Re-Evaluation 09/15/16   Authorization Type [employer] HMO; $45 copay; G code and progress note every 10th visit   PT Start Time 1317   PT Stop Time 1405   PT Time Calculation (min) 48 min   Activity Tolerance Patient tolerated treatment well   Behavior During Therapy [facility] for tasks assessed/performed      Past Medical History:  Diagnosis Date  . Allergy   . Arthritis   . Blood transfusion without reported diagnosis    2013  . Depression   . DVT (deep venous thrombosis) ([place]) 2013  . Gait abnormality 08/03/2016  . Hyperlipidemia   . Hypertension   . Neurogenic bladder 08/03/2016  . Neuromuscular disorder ([place])    dx'd with MS at age of 62  . Right foot drop 08/03/2016  . Thyroid disease   . Ulcer     Past Surgical History:  Procedure Laterality Date  . COLON SURGERY     Colonoscopy  . GASTRIC BYPASS  2010  . INTERSTIM IMPLANT PLACEMENT  2014  . PORTACATH PLACEMENT      There were no vitals filed for this visit.      Subjective Assessment - 08/31/16 1321    Subjective Pt reports going to sit down on seat and missing seat; able to get back up without difficulty, no injury.     Patient is accompained by: Family member   Pertinent History MS, repeated falls   Limitations Walking   Patient Stated Goals Prevent future falls   Currently in Pain? No/denies           [facility] Adult PT Treatment/Exercise - 08/31/16 1333      Ambulation/Gait   Ambulation/Gait Assistance 4: Min guard   Ambulation/Gait Assistance Details Continued gait assessment with  posterior spring AFO on RLE with 1/4" heel wedge and shoe insole-pt ambulated x 115' over level surfaces with cane and min A with improved step length, stride length, foot clearance and decreased recurvatum but due to short depth of shoe pt's R heel would elevate out of shoe and supinates due to lack of ankle support/control.  Removed insole and performed gait again x 115' over level surfaces with improved ankle support and control.   Ambulation Distance (Feet) 115 Feet  x 2   Assistive device Straight cane   Gait Pattern Step-through pattern;Decreased step length - right;Decreased step length - left;Decreased stride length;Decreased hip/knee flexion - right;Decreased hip/knee flexion - left;Decreased dorsiflexion - right;Right genu recurvatum;Trendelenburg;Decreased trunk rotation;Right hip hike;Poor foot clearance - right   Ambulation Surface Level;Indoor   Gait velocity 15.25 seconds with AFO or 2.15   Stairs Yes   Stairs Assistance 5: Supervision   Stairs Assistance Details (indicate cue type and reason) with AFO   Stair Management Technique Two rails;Step to pattern;Forwards;With cane   Number of Stairs 4   Height of Stairs 6   Ramp 4: Min assist  with cane and AFO   Curb 4: Min assist  with cane and AFO  Balance Exercises - 08/31/16 1346      Balance Exercises: Standing   SLS Eyes open;Solid surface;Upper extremity support 2;3 reps;10 secs     OTAGO PROGRAM   Knee Flexor 10 reps  R and LLE           PT Education - 08/31/16 1458    Education provided Yes   Education Details continued discussion regarding AFO and shoe wear, provided pt with initial balance HEP   Person(s) Educated Patient   Methods Explanation;Demonstration;Handout   Comprehension Verbalized understanding;Returned demonstration             PT Long Term Goals - 08/31/16 1506      PT LONG TERM GOAL #1   Title (TARGET DATE FOR ALL LTG 09/15/16) Pt will demonstrate independence with  balance and strengthening HEP   Time 4   Period Weeks   Status New     PT LONG TERM GOAL #2   Title Pt will decrease falls risk as indicated by an increase in DGI >13   Baseline 12   Time 4   Period Weeks   Status New     PT LONG TERM GOAL #3   Title Pt will improve gait velocity with appropriate AFO and cane to >2.62 ft/sec and rollator to >2.0 ft/sec to decrease falls risk with community ambulation   Baseline 1.67ft/sec with rollator, 2.0 ft/sec with walking stick, no AFO, 2.15 with AFO and walking stick   Time 4   Period Weeks   Status New     PT LONG TERM GOAL #4   Title Pt will ambulate x 500' over outdoor surfaces (paved, grassy, curb, etc) with LRAD, and up/down 12 stairs with 2 rails appropriate AFO and Mod I with no LOB   Time 4   Period Weeks   Status New     PT LONG TERM GOAL #5   Title Pt will demonstrate independent donning of AFO and skin check of RLE following wear of AFO >4 hours   Time 4   Period Weeks   Status New               Plan - 08/31/16 1500    Clinical Impression Statement Continued to focus on gait assessment and training on level surfaces, curb, ramp and stairs with posterior leaf spring AFO with 1/4" heel wedge.  Pt noted to be elevating heel out of shoe during gait with foot supination due to decreased shoe depth.  Removed insole and demonstrated improved ankle stability and support.  Pt also demonstrated improvement in gait speed with AFO. Pt will either need to wear AFO with deeper shoe or wear without insole on AFO with current shoes.  Orthotist not present for this session; will contact to confirm that he will be present at next visit due to pt's limited visit #.  Also provided pt with initial balance and strengthening HEP and information about pro bono PT clinic for continued therapy after D/C.     Rehab Potential Good   Clinical Impairments Affecting Rehab Potential long history of R hemiplegia from MS   PT Treatment/Interventions  ADLs/Self Care Home Management;Electrical Stimulation;DME Instruction;Gait training;Stair training;Functional mobility training;Therapeutic activities;Therapeutic exercise;Balance training;Neuromuscular re-education;Patient/family education;Orthotic Fit/Training;Energy conservation   PT Next Visit Plan Gait with R post. AFO and heel wedge with Gerald Stabs present to observe, NMES to R ankle DF, knee and hip flexors, review and add to HEP for bilat LE strengthening   Consulted and Agree with Plan of Care  Patient      Patient will benefit from skilled therapeutic intervention in order to improve the following deficits and impairments:  Abnormal gait, Decreased balance, Decreased strength, Difficulty walking  Visit Diagnosis: Foot drop, right  Other abnormalities of gait and mobility  Repeated falls  Unsteadiness on feet     Problem List Patient Active Problem List   Diagnosis Date Noted  . Neurogenic bladder 08/03/2016  . Gait abnormality 08/03/2016  . Right foot drop 08/03/2016  . Hypothyroidism 01/31/2016  . Insomnia 01/31/2016  . Multiple sclerosis ([place]) 01/31/2016  . PTSD (post-traumatic stress disorder) 01/31/2016   Raylene Everts, PT, DPT 08/31/16    3:09 PM    [facility] [address] [place], [place], [address] Phone: [phone]   Fax:  [phone]  Name: Amber Williams MRN: [id number] Date of Birth: 03/11/1976

## 2016-09-04 ENCOUNTER — Encounter: Payer: Self-pay | Admitting: Physical Therapy

## 2016-09-04 ENCOUNTER — Ambulatory Visit: Payer: Medicare HMO | Admitting: Physical Therapy

## 2016-09-04 DIAGNOSIS — R2681 Unsteadiness on feet: Secondary | ICD-10-CM | POA: Diagnosis not present

## 2016-09-04 DIAGNOSIS — R296 Repeated falls: Secondary | ICD-10-CM

## 2016-09-04 DIAGNOSIS — R2689 Other abnormalities of gait and mobility: Secondary | ICD-10-CM | POA: Diagnosis not present

## 2016-09-04 DIAGNOSIS — M21371 Foot drop, right foot: Secondary | ICD-10-CM | POA: Diagnosis not present

## 2016-09-04 NOTE — Therapy (Signed)
[place] [facility] [address] [place], [place], [address] Phone: [phone]   Fax:  [phone]  Physical Therapy Treatment  Patient Details  Name: Amber Williams MRN: [id number] Date of Birth: 1975-06-04 Referring Provider: Kathrynn Ducking, MD  Encounter Date: 09/04/2016      PT End of Session - 09/04/16 1021    Visit Number 4   Number of Visits 5   Date for PT Re-Evaluation 09/15/16   Authorization Type [employer] HMO; $45 copay; G code and progress note every 10th visit   PT Start Time 0932   PT Stop Time 1016   PT Time Calculation (min) 44 min   Equipment Utilized During Treatment Other (comment)  AFO   Activity Tolerance Patient tolerated treatment well   Behavior During Therapy [facility] for tasks assessed/performed      Past Medical History:  Diagnosis Date  . Allergy   . Arthritis   . Blood transfusion without reported diagnosis    2013  . Depression   . DVT (deep venous thrombosis) ([place]) 2013  . Gait abnormality 08/03/2016  . Hyperlipidemia   . Hypertension   . Neurogenic bladder 08/03/2016  . Neuromuscular disorder ([place])    dx'd with MS at age of 32  . Right foot drop 08/03/2016  . Thyroid disease   . Ulcer     Past Surgical History:  Procedure Laterality Date  . COLON SURGERY     Colonoscopy  . GASTRIC BYPASS  2010  . INTERSTIM IMPLANT PLACEMENT  2014  . PORTACATH PLACEMENT      There were no vitals filed for this visit.      Subjective Assessment - 09/04/16 0954    Subjective No falls over the weekend; brought in two other pairs of shoes to try with AFO   Patient is accompained by: Family member   Pertinent History MS, repeated falls   Limitations Walking   Patient Stated Goals Prevent future falls   Currently in Pain? No/denies                         [facility] Adult PT Treatment/Exercise - 09/04/16 0955      Ambulation/Gait   Ambulation/Gait Yes   Ambulation/Gait  Assistance 5: Supervision;4: Min guard   Ambulation/Gait Assistance Details Able to place AFO in pair of Nike Revolution shoes and able to sit heel down in shoe.  Performed gait with insole in shoe over AFO and then again without insole due to blister/rubbing top of middle toe.  Without insole pt noted to have increased recurvatum during gait on level surfaces.  May require larger shoes or wear moleskin on toes vs. taller AFO and wear without insole.  Will continue to assess with orthotist present.   Ambulation Distance (Feet) 130 Feet   Assistive device Straight cane;Other (Comment)  Posterior leaf AFO   Ambulation Surface Level;Indoor   Stairs Yes   Stairs Assistance 5: Supervision   Stairs Assistance Details (indicate cue type and reason) with AFO   Stair Management Technique One rail Left;One rail Right;Step to pattern;Forwards;With cane;Other (comment)  AFO with and then without insole   Number of Stairs 8   Height of Stairs 6   Ramp 4: Min assist   Ramp Details (indicate cue type and reason) with cane and AFO x 2 reps   Curb 3: Mod assist   Curb Details (indicate cue type and reason) with cane and AFO with mod A  to fully advance COG forwards when ascending curb     Exercises   Exercises Knee/Hip     Knee/Hip Exercises: Standing   Lateral Step Up Right;Left;Hand Hold: 2;Step Height: 6"  8 reps each LE with AFO donned   Forward Step Up Right;Left;10 reps;Hand Hold: 2;Step Height: 6"  with AFO donned   Forward Step Up Limitations pt demonstrated decreased hip flexion and increased circumduction and hip hiking as she fatigued                PT Education - 09/04/16 1020    Education provided Yes   Education Details Continued discussion regarding AFO and proper shoe wear, skin care and skin checks   Person(s) Educated Patient   Methods Explanation;Demonstration   Comprehension Verbalized understanding             PT Long Term Goals - 08/31/16 1506      PT LONG  TERM GOAL #1   Title (TARGET DATE FOR ALL LTG 09/15/16) Pt will demonstrate independence with balance and strengthening HEP   Time 4   Period Weeks   Status New     PT LONG TERM GOAL #2   Title Pt will decrease falls risk as indicated by an increase in DGI >13   Baseline 12   Time 4   Period Weeks   Status New     PT LONG TERM GOAL #3   Title Pt will improve gait velocity with appropriate AFO and cane to >2.62 ft/sec and rollator to >2.0 ft/sec to decrease falls risk with community ambulation   Baseline 1.62ft/sec with rollator, 2.0 ft/sec with walking stick, no AFO, 2.15 with AFO and walking stick   Time 4   Period Weeks   Status New     PT LONG TERM GOAL #4   Title Pt will ambulate x 500' over outdoor surfaces (paved, grassy, curb, etc) with LRAD, and up/down 12 stairs with 2 rails appropriate AFO and Mod I with no LOB   Time 4   Period Weeks   Status New     PT LONG TERM GOAL #5   Title Pt will demonstrate independent donning of AFO and skin check of RLE following wear of AFO >4 hours   Time 4   Period Weeks   Status New               Plan - 09/04/16 1022    Clinical Impression Statement Orthotist not present for session today.  Continued to focus on gait training and gait assessment with posterior leaf spring and 1/4" heel wedge with different pair of shoes-Nike Revolutions with increased stiffness in heel and deeper toe box.  With insole on top of AFO pt was able to keep heel seated in shoe and did not demonstrate increased foot supination but pt reporting increased pressure on dorsal part of middle toe with blister present.  Removed insole and ambulated again with decreased pressure but increased genu recurvatum.  Will discuss options with orthotist.  Continued single limb stance and LE strength training with forwards and lateral step ups.  Pt fatigued quickly with increased compensatory movements.  Discussed other options for continued rehabilitation at D/C including  therapeutic horseback riding; will investigate options for patient.     Rehab Potential Good   Clinical Impairments Affecting Rehab Potential long history of R hemiplegia from MS   PT Frequency 1x / week   PT Duration 4 weeks   PT Treatment/Interventions ADLs/Self Care Home  Management;Electrical Stimulation;DME Instruction;Gait training;Stair training;Functional mobility training;Therapeutic activities;Therapeutic exercise;Balance training;Neuromuscular re-education;Patient/family education;Orthotic Fit/Training;Energy conservation   PT Next Visit Plan Last visit-Make sure CHRIS will be there to assess for AFO!  Pt needs black [employer].  Assess with posterior leaf AFO and heel wedge and re-assess LTG.  Provide pt with therapeutic horseback riding information   Consulted and Agree with Plan of Care Patient      Patient will benefit from skilled therapeutic intervention in order to improve the following deficits and impairments:  Abnormal gait, Decreased balance, Decreased strength, Difficulty walking  Visit Diagnosis: Foot drop, right  Other abnormalities of gait and mobility  Repeated falls  Unsteadiness on feet     Problem List Patient Active Problem List   Diagnosis Date Noted  . Neurogenic bladder 08/03/2016  . Gait abnormality 08/03/2016  . Right foot drop 08/03/2016  . Hypothyroidism 01/31/2016  . Insomnia 01/31/2016  . Multiple sclerosis ([place]) 01/31/2016  . PTSD (post-traumatic stress disorder) 01/31/2016   Raylene Everts, PT, DPT 09/04/16    10:29 AM    [facility] [address] [place], [place], [address] Phone: [phone]   Fax:  [phone]  Name: ANNLEIGH KNUEPPEL MRN: [id number] Date of Birth: 11-14-1975

## 2016-09-07 DIAGNOSIS — R350 Frequency of micturition: Secondary | ICD-10-CM | POA: Diagnosis not present

## 2016-09-07 DIAGNOSIS — N3941 Urge incontinence: Secondary | ICD-10-CM | POA: Diagnosis not present

## 2016-09-07 DIAGNOSIS — R351 Nocturia: Secondary | ICD-10-CM | POA: Diagnosis not present

## 2016-09-11 ENCOUNTER — Ambulatory Visit: Payer: Medicare HMO | Admitting: Physical Therapy

## 2016-09-11 DIAGNOSIS — R2689 Other abnormalities of gait and mobility: Secondary | ICD-10-CM | POA: Diagnosis not present

## 2016-09-11 DIAGNOSIS — M21371 Foot drop, right foot: Secondary | ICD-10-CM | POA: Diagnosis not present

## 2016-09-11 DIAGNOSIS — R2681 Unsteadiness on feet: Secondary | ICD-10-CM

## 2016-09-11 DIAGNOSIS — R296 Repeated falls: Secondary | ICD-10-CM | POA: Diagnosis not present

## 2016-09-11 NOTE — Therapy (Signed)
[facility] [address] [place], [place], [address] Phone: [phone]   Fax:  [phone]  Physical Therapy Treatment  Patient Details  Name: Amber Williams MRN: [id number] Date of Birth: 12/16/1975 Referring Provider: Kathrynn Ducking, MD  Encounter Date: 09/11/2016      PT End of Session - 09/11/16 1729    Visit Number 5   Number of Visits 5   Date for PT Re-Evaluation 09/15/16  D/C today   Authorization Type [employer]; $45 copay; G code and progress note every 10th visit   PT Start Time 1315   PT Stop Time 1405   PT Time Calculation (min) 50 min   Activity Tolerance Patient tolerated treatment well   Behavior During Therapy [facility] for tasks assessed/performed      Past Medical History:  Diagnosis Date  . Allergy   . Arthritis   . Blood transfusion without reported diagnosis    2013  . Depression   . DVT (deep venous thrombosis) ([place]) 2013  . Gait abnormality 08/03/2016  . Hyperlipidemia   . Hypertension   . Neurogenic bladder 08/03/2016  . Neuromuscular disorder ([place])    dx'd with MS at age of 60  . Right foot drop 08/03/2016  . Thyroid disease   . Ulcer     Past Surgical History:  Procedure Laterality Date  . COLON SURGERY     Colonoscopy  . GASTRIC BYPASS  2010  . INTERSTIM IMPLANT PLACEMENT  2014  . PORTACATH PLACEMENT      There were no vitals filed for this visit.      Subjective Assessment - 09/11/16 1334    Subjective No issues over the weekend, reports standing for a long period of time and feeling tired today.   Patient is accompained by: Family member   Pertinent History MS, repeated falls   Limitations Walking   Patient Stated Goals Prevent future falls   Currently in Pain? No/denies            [facility] PT Assessment - 09/11/16 1335      Ambulation/Gait   Stairs Yes   Stairs Assistance 6: Modified independent (Device/Increase time)   Stairs Assistance Details (indicate  cue type and reason) with AFO, 2 rails and then with cane and one rail   Stair Management Technique One rail Right;Two rails;Alternating pattern;Step to pattern;Forwards;With cane;Other (comment)  AFO   Number of Stairs 14   Height of Stairs 6     Standardized Balance Assessment   Standardized Balance Assessment 10 meter walk test   10 Meter Walk 15.94 seconds or 2.52f/sec                     [facility] Adult PT Treatment/Exercise - 09/11/16 1335      Ambulation/Gait   Ambulation/Gait Yes   Ambulation/Gait Assistance 6: Modified independent (Device/Increase time)   Ambulation/Gait Assistance Details Performed gait x 2 while assessing gait with [place] PLS with heel wedge vs. TFrancetta FoundPLS without heel wedge.  With [place] pt presented with increased circumduction and genu recurvatum.  With Therashue pt demonstrated improved knee control and improved foot clearance without circumduction.  Pt did have mild supination with Therashue-discussed with pt purchase of new shoes with stiffer soles and deeper heel and toe box.     Ambulation Distance (Feet) 200 Feet   Assistive device Straight cane;Other (Comment)   Gait Pattern Step-through pattern;Decreased stance time - right;Decreased hip/knee flexion - right;Decreased dorsiflexion -  right;Right circumduction;Right genu recurvatum;Lateral hip instability;Poor foot clearance - right   Ambulation Surface Level;Indoor     Standardized Balance Assessment   Standardized Balance Assessment Dynamic Gait Index     Dynamic Gait Index   Level Surface Moderate Impairment   Change in Gait Speed Mild Impairment   Gait with Horizontal Head Turns Mild Impairment   Gait with Vertical Head Turns Mild Impairment   Gait and Pivot Turn Mild Impairment   Step Over Obstacle Moderate Impairment   Step Around Obstacles Mild Impairment   Steps Mild Impairment   Total Score 14                PT Education - 09/11/16 1728    Education  provided Yes   Education Details AFO recommendations and f/u with orthotist, proper shoe wear, f/u with pro bono therapy clinic, information on therapeutic horseback riding   Person(s) Educated Patient   Methods Explanation   Comprehension Verbalized understanding             PT Long Term Goals - 09/11/16 1731      PT LONG TERM GOAL #1   Title (TARGET DATE FOR ALL LTG 09/15/16) Pt will demonstrate independence with balance and strengthening HEP   Status Achieved     PT LONG TERM GOAL #2   Title Pt will decrease falls risk as indicated by an increase in DGI >13   Baseline 14   Status Achieved     PT LONG TERM GOAL #3   Title Pt will improve gait velocity with appropriate AFO and cane to >2.62 ft/sec and rollator to >2.0 ft/sec to decrease falls risk with community ambulation   Baseline 2.05 ft/sec with AFO and cane   Status Not Met     PT LONG TERM GOAL #4   Title Pt will ambulate x 500' over outdoor surfaces (paved, grassy, curb, etc) with LRAD, and up/down 12 stairs with 2 rails appropriate AFO and Mod I with no LOB   Baseline performed stairs Mod I but did not get to assess outside gait   Status Partially Met     PT LONG TERM GOAL #5   Title Pt will demonstrate independent donning of AFO and skin check of RLE following wear of AFO >4 hours   Status Achieved               Plan - 09/11/16 1729    Clinical Impression Statement Pt seen for final session with focus on re-assessment of LTG, balance, gait and gait assessment with two types of AFO with orthotist present.  Pt, PT and orthotist agreed on Therashue PLS due to stiffer design for increased knee control; pt demonstrated improved and safer gait mechanics with Therashue overall.  Pt to f/u with orthotist once benefits verified and AFO ordered.  Pt has met 3/5 LTG, partially met one LTG but did not meet gait velocity goal but did show improvements in gait velocity and efficiency of gait with AFO.  Pt showed  improvements in balance and safety with stair negotiation.  Pt has had no falls over past 3 weeks.  Pt to seek further treatment at pro bono therapy clinic for continue gait and balance training.  Pt to D/C from PT today.   Rehab Potential Good   Clinical Impairments Affecting Rehab Potential long history of R hemiplegia from MS   PT Treatment/Interventions ADLs/Self Care Home Management;Electrical Stimulation;DME Instruction;Gait training;Stair training;Functional mobility training;Therapeutic activities;Therapeutic exercise;Balance training;Neuromuscular re-education;Patient/family education;Orthotic Fit/Training;Energy conservation  PT Next Visit Plan D/C today   Consulted and Agree with Plan of Care Patient      Patient will benefit from skilled therapeutic intervention in order to improve the following deficits and impairments:  Abnormal gait, Decreased balance, Decreased strength, Difficulty walking  Visit Diagnosis: Foot drop, right  Other abnormalities of gait and mobility  Repeated falls  Unsteadiness on feet       G-Codes - 2016/09/24 1736    Functional Assessment Tool Used (Outpatient Only) gait velocity, DGI   Functional Limitation Mobility: Walking and moving around   Mobility: Walking and Moving Around Goal Status [phone]) At least 20 percent but less than 40 percent impaired, limited or restricted   Mobility: Walking and Moving Around Discharge Status [phone]) At least 40 percent but less than 60 percent impaired, limited or restricted      Problem List Patient Active Problem List   Diagnosis Date Noted  . Neurogenic bladder 08/03/2016  . Gait abnormality 08/03/2016  . Right foot drop 08/03/2016  . Hypothyroidism 01/31/2016  . Insomnia 01/31/2016  . Multiple sclerosis ([place]) 01/31/2016  . PTSD (post-traumatic stress disorder) 01/31/2016   PHYSICAL THERAPY DISCHARGE SUMMARY  Visits from Start of Care: 5  Current functional level related to goals / functional  outcomes: See above   Remaining deficits: Impaired strength, impaired endurance and activity tolerance, impaired balance, gait, increased falls risk   Education / Equipment: HEP, posterior leaf spring AFO  Plan: Patient agrees to discharge.  Patient goals were partially met. Patient is being discharged due to financial reasons.  ?????     Raylene Everts, PT, DPT 09/24/16    5:38 PM    [facility] [address] [place], [place], [address] Phone: [phone]   Fax:  [phone]  Name: Amber Williams MRN: [id number] Date of Birth: 12-25-1975

## 2016-09-27 DIAGNOSIS — N3941 Urge incontinence: Secondary | ICD-10-CM | POA: Diagnosis not present

## 2016-09-27 DIAGNOSIS — R351 Nocturia: Secondary | ICD-10-CM | POA: Diagnosis not present

## 2016-09-29 ENCOUNTER — Telehealth: Payer: Self-pay | Admitting: *Deleted

## 2016-09-29 NOTE — Telephone Encounter (Signed)
Faxed completed/signed order to [facility] re: AFO brace. Fax: [phone]. Received confirmation.

## 2016-10-02 ENCOUNTER — Other Ambulatory Visit: Payer: Self-pay | Admitting: Family Medicine

## 2016-11-09 ENCOUNTER — Encounter: Payer: Self-pay | Admitting: Adult Health

## 2016-11-09 ENCOUNTER — Ambulatory Visit (INDEPENDENT_AMBULATORY_CARE_PROVIDER_SITE_OTHER): Payer: Medicare HMO | Admitting: Adult Health

## 2016-11-09 VITALS — BP 108/70 | HR 100 | Wt 203.6 lb

## 2016-11-09 DIAGNOSIS — G35 Multiple sclerosis: Secondary | ICD-10-CM

## 2016-11-09 DIAGNOSIS — M62838 Other muscle spasm: Secondary | ICD-10-CM | POA: Diagnosis not present

## 2016-11-09 DIAGNOSIS — R269 Unspecified abnormalities of gait and mobility: Secondary | ICD-10-CM | POA: Diagnosis not present

## 2016-11-09 MED ORDER — BACLOFEN 10 MG PO TABS: 10.0000 mg | ORAL_TABLET | Freq: Every evening | ORAL | 1 refills | Status: DC | PRN

## 2016-11-09 NOTE — Patient Instructions (Signed)
Your Plan:  Continue Ocrevus Start Baclofen 10 mg at bedtime if needed for muscle spasms If your symptoms worsen or you develop new symptoms please let us know.   Thank you for coming to see [name] at [facility]. I hope we have been able to provide you high quality care today.  You may receive a patient satisfaction survey over the next few weeks. We would appreciate your feedback and comments so that we may continue to improve ourselves and the health of our patients.  Baclofen tablets What is this medicine? BACLOFEN (BAK loe fen) helps relieve spasms and cramping of muscles. It may be used to treat symptoms of multiple sclerosis or spinal cord injury. This medicine may be used for other purposes; ask your health care provider or pharmacist if you have questions. COMMON BRAND NAME(S): ED Baclofen, Lioresal What should I tell my health care provider before I take this medicine? They need to know if you have any of these conditions: -kidney disease -seizures -stroke -an unusual or allergic reaction to baclofen, other medicines, foods, dyes, or preservatives -pregnant or trying to get pregnant -breast-feeding How should I use this medicine? Take this medicine by mouth. Swallow it with a drink of water. Follow the directions on the prescription label. Do not take more medicine than you are told to take. Talk to your pediatrician regarding the use of this medicine in children. Special care may be needed. Overdosage: If you think you have taken too much of this medicine contact a poison control center or emergency room at once. NOTE: This medicine is only for you. Do not share this medicine with others. What if I miss a dose? If you miss a dose, take it as soon as you can. If it is almost time for your next dose, take only that dose. Do not take double or extra doses. What may interact with this medicine? Do not take this medication with any of the following  medicines: -narcotic medicines for cough This medicine may also interact with the following medications: -alcohol -antihistamines for allergy, cough and cold -certain medicines for anxiety or sleep -certain medicines for depression like amitriptyline, fluoxetine, sertraline -certain medicines for seizures like phenobarbital, primidone -general anesthetics like halothane, isoflurane, methoxyflurane, propofol -local anesthetics like lidocaine, pramoxine, tetracaine -medicines that relax muscles for surgery -narcotic medicines for pain -phenothiazines like chlorpromazine, mesoridazine, prochlorperazine, thioridazine This list may not describe all possible interactions. Give your health care provider a list of all the medicines, herbs, non-prescription drugs, or dietary supplements you use. Also tell them if you smoke, drink alcohol, or use illegal drugs. Some items may interact with your medicine. What should I watch for while using this medicine? Tell your doctor or health care professional if your symptoms do not start to get better or if they get worse. Do not suddenly stop taking your medicine. If you do, you may develop a severe reaction. If your doctor wants you to stop the medicine, the dose will be slowly lowered over time to avoid any side effects. Follow the advice of your doctor. You may get drowsy or dizzy. Do not drive, use machinery, or do anything that needs mental alertness until you know how this medicine affects you. Do not stand or sit up quickly, especially if you are an older patient. This reduces the risk of dizzy or fainting spells. Alcohol may interfere with the effect of this medicine. Avoid alcoholic drinks. If you are taking another medicine that also causes drowsiness,  you may have more side effects. Give your health care provider a list of all medicines you use. Your doctor will tell you how much medicine to take. Do not take more medicine than directed. Call emergency for  help if you have problems breathing or unusual sleepiness. What side effects may I notice from receiving this medicine? Side effects that you should report to your doctor or health care professional as soon as possible: -allergic reactions like skin rash, itching or hives, swelling of the face, lips, or tongue -breathing problems -changes in emotions or moods -changes in vision -chest pain -fast, irregular heartbeat -feeling faint or lightheaded, falls -hallucinations -loss of balance or coordination -ringing of the ears -seizures -trouble passing urine or change in the amount of urine -trouble walking -unusually weak or tired Side effects that usually do not require medical attention (report to your doctor or health care professional if they continue or are bothersome): -changes in taste -confusion -constipation -diarrhea -dry mouth -headache -muscle weakness -nausea, vomiting -trouble sleeping This list may not describe all possible side effects. Call your doctor for medical advice about side effects. You may report side effects to FDA at 1-800-FDA-1088. Where should I keep my medicine? Keep out of the reach of children. Store at room temperature between 15 and 30 degrees C (59 and 86 degrees F). Keep container tightly closed. Throw away any unused medicine after the expiration date. NOTE: This sheet is a summary. It may not cover all possible information. If you have questions about this medicine, talk to your doctor, pharmacist, or health care provider.  2018 Elsevier/Gold Standard (2015-01-25 15:56:23)

## 2016-11-09 NOTE — Progress Notes (Signed)
I have read the note, and I agree with the clinical assessment and plan.  WILLIS,CHARLES KEITH   

## 2016-11-09 NOTE — Progress Notes (Signed)
PATIENT: Amber Williams DOB: 11/15/1975  REASON FOR VISIT: follow up HISTORY FROM: patient  HISTORY OF PRESENT ILLNESS: Today 11/09/16 Amber Williams is a 41 year old female with a history of multiple sclerosis. She returns today for follow-up. She is currently taking Ocrevus. Her next infusion is due at the end of September. She reports that she has been tolerating those well. She denies any new symptoms. Denies any new numbness or weakness. No change in the bowels or bladder. She does have auto-stimulator for the bladder which she adjusts. Denies any changes with her gait or balance. She states that she will get her AFO brace for the right leg on Monday. She states that she's been having cramps in the right calf and foot that usually occur during the night. She states that she's been on Requip and Remeron with no benefit. She returns today for an evaluation.   HISTORY 08/03/16: Amber Williams is a 41 year old right-handed white female with a history of multiple sclerosis associated with a gait disorder and a neurogenic bladder. The patient has had episodes of falling that are usually associated when she is at home, using a cane instead of the walker. She has not had any falls with a walker. She has bilateral lower extremity weakness, right greater than left associated with hip flexor weakness and a footdrop on the right. The patient has a neurogenic bladder, she has an InterStim stimulator for the bladder. The patient claims that the stimulator is MRI compatible, she has a programming unit where she can turn off the unit or reprogram the unit if needed. The patient uses adult diapers as well. The patient has an AFO brace for the right foot, but it is a PAFO, and she rarely uses the brace. The patient still operate a motor vehicle, but she has difficulty transitioning from the accelerator the brake and vice versa. The patient has just recently started Ocrevus, she has some fatigue following the second  dose, but otherwise she has tolerated this well. The patient denies any new symptoms of numbness, weakness, vision complaints, or changes in bladder function. She returns for an evaluation.   REVIEW OF SYSTEMS: Out of a complete 14 system review of symptoms, the patient complains only of the following symptoms, and all other reviewed systems are negative.  Restless leg, insomnia, frequent waking, muscle cramps, urgency, frequency of urination, comments or bladder, painful urination, frequent infections, Route/bleed easily  ALLERGIES: Allergies  Allergen Reactions  . Tecfidera [Dimethyl Fumarate] Itching    HOME MEDICATIONS: Outpatient Medications Prior to Visit  Medication Sig Dispense Refill  . Cholecalciferol (VITAMIN D3) 5000 units CAPS Take 5,000 Units by mouth every morning.    [name] levothyroxine (SYNTHROID, LEVOTHROID) 100 MCG tablet Take 100 mcg by mouth daily.    Wess Botts (OCREVUS IV) Inject into the vein.    [name] omeprazole (PRILOSEC) 20 MG capsule Take 20 mg by mouth daily.    [name] PARoxetine (PAXIL) 30 MG tablet TAKE 1 TABLET EVERY DAY 90 tablet 0  . mirtazapine (REMERON SOL-TAB) 30 MG disintegrating tablet Take 1 tablet (30 mg total) by mouth at bedtime. (Patient not taking: Reported on 11/09/2016) 30 tablet 5  . rOPINIRole (REQUIP) 1 MG tablet Take 1 tablet (1 mg total) by mouth daily with supper. (Patient not taking: Reported on 11/09/2016) 90 tablet 0   No facility-administered medications prior to visit.     PAST MEDICAL HISTORY: Past Medical History:  Diagnosis Date  . Allergy   . Arthritis   .  Blood transfusion without reported diagnosis    2013  . Depression   . DVT (deep venous thrombosis) ([place]) 2013  . Gait abnormality 08/03/2016  . Hyperlipidemia   . Hypertension   . Multiple sclerosis ([place])   . Neurogenic bladder 08/03/2016  . Neuromuscular disorder ([place])    dx'd with MS at age of 39  . Right foot drop 08/03/2016  . Thyroid disease   . Ulcer     PAST  SURGICAL HISTORY: Past Surgical History:  Procedure Laterality Date  . COLON SURGERY     Colonoscopy  . GASTRIC BYPASS  2010  . INTERSTIM IMPLANT PLACEMENT  2014  . PORTACATH PLACEMENT      FAMILY HISTORY: Family History  Problem Relation Age of Onset  . Cancer Mother        Dx'd at age 55  . Breast cancer Mother   . Hypertension Father   . Diabetes Father   . Hypertension Sister   . Miscarriages / Stillbirths Sister   . Heart disease Maternal Grandmother   . Diabetes Maternal Grandmother   . Heart disease Maternal Grandfather   . Diabetes Paternal Grandmother   . Heart disease Paternal Grandfather   . Stroke Paternal Grandfather     SOCIAL HISTORY: Social History   Social History  . Marital status: Single    Spouse name: N/A  . Number of children: 0  . Years of education: 12   Occupational History  . Disability    Social History Main Topics  . Smoking status: Former Smoker    Start date: 1993    Quit date: 01/30/2009  . Smokeless tobacco: Never Used  . Alcohol use Yes     Comment: Couple of times per week  . Drug use: No  . Sexual activity: No   Other Topics Concern  . Not on file   Social History Narrative   Lives at home alone   Right-handed   Caffeine: 2 glasses per day      PHYSICAL EXAM  Vitals:   11/09/16 1037  BP: 108/70  Pulse: 100  Weight: 203 lb 9.6 oz (92.4 kg)   Body mass index is 33.88 kg/m.  Generalized: Well developed, in no acute distress   Neurological examination  Mentation: Alert oriented to time, place, history taking. Follows all commands speech and language fluent Cranial nerve II-XII: Pupils were equal round reactive to light. Extraocular movements were full, visual field were full on confrontational test. Facial sensation and strength were normal. Uvula tongue midline. Head turning and shoulder shrug  were normal and symmetric. Motor: The motor testing reveals 5 over 5 strength In the upper extremities. 4/5 strength  in the LLE and 3/5 in the RLE. Sensory: Sensory testing is intact to soft touch on all 4 extremities. No evidence of extinction is noted.  Coordination: Cerebellar testing reveals good finger-nose-finger bilaterally. Unable to complete heel to toe due to mobility.  Gait and station: Patient has a circumduction type gait on the right. Tandem gait not attempted. She uses a cane when ambulating. Reflexes: Deep tendon reflexes are symmetric and normal bilaterally.   DIAGNOSTIC DATA (LABS, IMAGING, TESTING) - I reviewed patient records, labs, notes, testing and imaging myself where available.  Lab Results  Component Value Date   WBC 10.2 08/03/2016   HGB 12.2 08/03/2016   HCT 39.6 08/03/2016   MCV 93 08/03/2016   PLT 304 08/03/2016      Component Value Date/Time   NA 143 08/03/2016 0917  K 3.7 08/03/2016 0917   CL 102 08/03/2016 0917   CO2 24 08/03/2016 0917   GLUCOSE 77 08/03/2016 0917   BUN 12 08/03/2016 0917   CREATININE 0.97 08/03/2016 0917   CALCIUM 9.5 08/03/2016 0917   PROT 5.6 (L) 08/03/2016 0917   ALBUMIN 4.2 08/03/2016 0917   AST 18 08/03/2016 0917   ALT 13 08/03/2016 0917   ALKPHOS 100 08/03/2016 0917   BILITOT 0.6 08/03/2016 0917   GFRNONAA 73 08/03/2016 0917   GFRAA 84 08/03/2016 0917   Lab Results  Component Value Date   TSH 1.040 08/03/2016      ASSESSMENT AND PLAN 41 y.o. year old female  has a past medical history of Allergy; Arthritis; Blood transfusion without reported diagnosis; Depression; DVT (deep venous thrombosis) ([place]) (2013); Gait abnormality (08/03/2016); Hyperlipidemia; Hypertension; Multiple sclerosis ([place]); Neurogenic bladder (08/03/2016); Neuromuscular disorder ([place]); Right foot drop (08/03/2016); Thyroid disease; and Ulcer. here with:  1. Multiple sclerosis 2. Abnormality of gait 3. Muscle spasm  The patient will remain on Ocrevus. She recently had blood work in April with Dr. Jannifer Franklin. We will not repeat blood work today. She will receive her  AFO brace on Monday. I will prescribe baclofen 10 mg at bedtime for muscle spasms. The patient has been on this medication before and tolerated well. I reviewed side effects with the patient and gave her a handout. She is advised that if her symptoms worsen or she develops new symptoms she should let us know. She will follow-up in 6 months or sooner if needed.     Ward Givens, MSN, NP-C 11/09/2016, 10:44 AM [facility] [address], [place], [place] [address] [phone]

## 2016-11-13 DIAGNOSIS — M21371 Foot drop, right foot: Secondary | ICD-10-CM | POA: Diagnosis not present

## 2016-12-06 ENCOUNTER — Ambulatory Visit (INDEPENDENT_AMBULATORY_CARE_PROVIDER_SITE_OTHER): Payer: Medicare HMO

## 2016-12-06 ENCOUNTER — Encounter: Payer: Self-pay | Admitting: Physician Assistant

## 2016-12-06 ENCOUNTER — Ambulatory Visit (INDEPENDENT_AMBULATORY_CARE_PROVIDER_SITE_OTHER): Payer: Medicare HMO | Admitting: Physician Assistant

## 2016-12-06 VITALS — BP 124/76 | HR 126 | Temp 97.2°F | Wt 198.0 lb

## 2016-12-06 DIAGNOSIS — M79671 Pain in right foot: Secondary | ICD-10-CM

## 2016-12-06 DIAGNOSIS — F5101 Primary insomnia: Secondary | ICD-10-CM

## 2016-12-06 DIAGNOSIS — S99921A Unspecified injury of right foot, initial encounter: Secondary | ICD-10-CM

## 2016-12-06 MED ORDER — NAPROXEN 500 MG PO TABS: 500.0000 mg | ORAL_TABLET | Freq: Two times a day (BID) | ORAL | 0 refills | Status: DC

## 2016-12-06 MED ORDER — TRAZODONE HCL 50 MG PO TABS: 50.0000 mg | ORAL_TABLET | Freq: Every evening | ORAL | 1 refills | Status: DC | PRN

## 2016-12-06 NOTE — Patient Instructions (Signed)
In a few days you may receive a survey in the mail or online from [employer] regarding your visit with us today. Please take a moment to fill this out. Your feedback is very important to our whole office. It can help us better understand your needs as well as improve your experience and satisfaction. Thank you for taking your time to complete it. We care about you.  Kerry Odonohue, PA-C  

## 2016-12-08 NOTE — Progress Notes (Signed)
BP 124/76   Pulse (!) 126   Temp (!) 97.2 F (36.2 C) (Oral)   Wt 198 lb (89.8 kg)   BMI 32.95 kg/m    Subjective:    Patient ID: Amber Williams, female    DOB: 1975-11-14, 41 y.o.   MRN: [id number]  HPI: Amber Williams is a 41 y.o. female presenting on 12/06/2016 for Foot Pain (hurt right foot )  4 days prior to the visit the patient was moving through her house with her rolling walker. She does have 4 toes on her right foot that have curling related to the nerve damage from her MS. She hit the toes on the corner of the trunk. She had significant amount of pain. There is been some mild swelling. She is try to keep ice on it.  The patient has also tried a couple of things over-the-counter for insomnia. She'll be seen Dr. Livia Snellen again soon. She would like to try one more thing before she goes back. We have discussed trazodone as an option.  Relevant past medical, surgical, family and social history reviewed and updated as indicated. Allergies and medications reviewed and updated.  Past Medical History:  Diagnosis Date  . Allergy   . Arthritis   . Blood transfusion without reported diagnosis    2013  . Depression   . DVT (deep venous thrombosis) ([place]) 2013  . Gait abnormality 08/03/2016  . Hyperlipidemia   . Hypertension   . Multiple sclerosis ([place])   . Neurogenic bladder 08/03/2016  . Neuromuscular disorder ([place])    dx'd with MS at age of 41  . Right foot drop 08/03/2016  . Thyroid disease   . Ulcer     Past Surgical History:  Procedure Laterality Date  . COLON SURGERY     Colonoscopy  . GASTRIC BYPASS  2010  . INTERSTIM IMPLANT PLACEMENT  2014  . PORTACATH PLACEMENT      Review of Systems  Constitutional: Negative.   HENT: Negative.   Eyes: Negative.   Respiratory: Negative.   Gastrointestinal: Negative.   Genitourinary: Negative.   Musculoskeletal: Positive for arthralgias and joint swelling.  Psychiatric/Behavioral: Positive for sleep disturbance. Negative  for dysphoric mood and suicidal ideas. The patient is not nervous/anxious.     Allergies as of 12/06/2016      Reactions   Tecfidera [dimethyl Fumarate] Itching      Medication List       Accurate as of 12/06/16 11:59 PM. Always use your most recent med list.          baclofen 10 MG tablet Commonly known as:  LIORESAL Take 1 tablet (10 mg total) by mouth at bedtime as needed for muscle spasms.   levothyroxine 100 MCG tablet Commonly known as:  SYNTHROID, LEVOTHROID Take 100 mcg by mouth daily.   naproxen 500 MG tablet Commonly known as:  NAPROSYN Take 1 tablet (500 mg total) by mouth 2 (two) times daily with a meal.   OCREVUS IV Inject into the vein.   omeprazole 20 MG capsule Commonly known as:  PRILOSEC Take 20 mg by mouth daily.   PARoxetine 30 MG tablet Commonly known as:  PAXIL TAKE 1 TABLET EVERY DAY   traZODone 50 MG tablet Commonly known as:  DESYREL Take 1-2 tablets (50-100 mg total) by mouth at bedtime as needed for sleep.   Vitamin D3 5000 units Caps Take 5,000 Units by mouth every morning.          Objective:  BP 124/76   Pulse (!) 126   Temp (!) 97.2 F (36.2 C) (Oral)   Wt 198 lb (89.8 kg)   BMI 32.95 kg/m   Allergies  Allergen Reactions  . Tecfidera [Dimethyl Fumarate] Itching    Physical Exam  Constitutional: She is oriented to person, place, and time. She appears well-developed and well-nourished.  HENT:  Head: Normocephalic and atraumatic.  Eyes: Pupils are equal, round, and reactive to light. Conjunctivae and EOM are normal.  Cardiovascular: Normal rate, regular rhythm, normal heart sounds and intact distal pulses.   Pulmonary/Chest: Effort normal and breath sounds normal.  Abdominal: Soft. Bowel sounds are normal.  Musculoskeletal:       Right foot: There is decreased range of motion, tenderness and swelling. There is no deformity.       Feet:  Neurological: She is alert and oriented to person, place, and time. She has  normal reflexes.  Skin: Skin is warm and dry. No rash noted.  Psychiatric: She has a normal mood and affect. Her behavior is normal. Judgment and thought content normal.        Assessment & Plan:   1. Injury of right foot, initial encounter - DG Foot Complete Right; Future - naproxen (NAPROSYN) 500 MG tablet; Take 1 tablet (500 mg total) by mouth 2 (two) times daily with a meal.  Dispense: 20 tablet; Refill: 0  2. Primary insomnia - traZODone (DESYREL) 50 MG tablet; Take 1-2 tablets (50-100 mg total) by mouth at bedtime as needed for sleep.  Dispense: 60 tablet; Refill: 1    Current Outpatient Prescriptions:  .  baclofen (LIORESAL) 10 MG tablet, Take 1 tablet (10 mg total) by mouth at bedtime as needed for muscle spasms., Disp: 30 each, Rfl: 1 .  Cholecalciferol (VITAMIN D3) 5000 units CAPS, Take 5,000 Units by mouth every morning., Disp: , Rfl:  .  levothyroxine (SYNTHROID, LEVOTHROID) 100 MCG tablet, Take 100 mcg by mouth daily., Disp: , Rfl:  .  Ocrelizumab (OCREVUS IV), Inject into the vein., Disp: , Rfl:  .  omeprazole (PRILOSEC) 20 MG capsule, Take 20 mg by mouth daily., Disp: , Rfl:  .  PARoxetine (PAXIL) 30 MG tablet, TAKE 1 TABLET EVERY DAY, Disp: 90 tablet, Rfl: 0 .  naproxen (NAPROSYN) 500 MG tablet, Take 1 tablet (500 mg total) by mouth 2 (two) times daily with a meal., Disp: 20 tablet, Rfl: 0 .  traZODone (DESYREL) 50 MG tablet, Take 1-2 tablets (50-100 mg total) by mouth at bedtime as needed for sleep., Disp: 60 tablet, Rfl: 1 Continue all other maintenance medications as listed above.  Follow up plan: Return if symptoms worsen or fail to improve.  Educational handout given for [facility] PA-C [facility] [address]  [place], [place] [address] [phone]   12/08/2016, 8:22 AM

## 2017-01-01 ENCOUNTER — Other Ambulatory Visit: Payer: Self-pay | Admitting: Family Medicine

## 2017-01-15 ENCOUNTER — Ambulatory Visit (INDEPENDENT_AMBULATORY_CARE_PROVIDER_SITE_OTHER): Payer: Medicare HMO | Admitting: Family Medicine

## 2017-01-15 ENCOUNTER — Ambulatory Visit: Payer: Medicare HMO | Admitting: Family Medicine

## 2017-01-15 ENCOUNTER — Encounter: Payer: Self-pay | Admitting: Family Medicine

## 2017-01-15 VITALS — BP 111/71 | HR 89 | Temp 97.3°F | Ht 65.0 in | Wt 187.0 lb

## 2017-01-15 DIAGNOSIS — F5101 Primary insomnia: Secondary | ICD-10-CM | POA: Diagnosis not present

## 2017-01-15 DIAGNOSIS — G35 Multiple sclerosis: Secondary | ICD-10-CM

## 2017-01-15 DIAGNOSIS — E039 Hypothyroidism, unspecified: Secondary | ICD-10-CM | POA: Diagnosis not present

## 2017-01-15 DIAGNOSIS — Z0000 Encounter for general adult medical examination without abnormal findings: Secondary | ICD-10-CM | POA: Diagnosis not present

## 2017-01-15 LAB — URINALYSIS, COMPLETE
BILIRUBIN UA: NEGATIVE
GLUCOSE, UA: NEGATIVE
Nitrite, UA: NEGATIVE
Urobilinogen, Ur: 0.2 mg/dL (ref 0.2–1.0)
pH, UA: 6 (ref 5.0–7.5)

## 2017-01-15 LAB — MICROSCOPIC EXAMINATION: Renal Epithel, UA: NONE SEEN /hpf

## 2017-01-15 MED ORDER — MIRTAZAPINE 45 MG PO TABS: 45.0000 mg | ORAL_TABLET | Freq: Every day | ORAL | 1 refills | Status: DC

## 2017-01-15 NOTE — Addendum Note (Signed)
Addended by: Michaela Corner on: 01/15/2017 12:08 PM   Modules accepted: Orders

## 2017-01-15 NOTE — Progress Notes (Signed)
Subjective:  Patient ID: Amber Williams, female    DOB: 1975-06-14  Age: 41 y.o. MRN: [id number]  CC: Annual Exam (PAP )   HPI Amber Williams presents for Annual physical as noted above. Patient has concerns about hot flashes and not sleeping in spite of using 250 mg of trazodone.  Depression screen [facility] 2/9 01/15/2017 12/06/2016 07/14/2016  Decreased Interest 0 0 0  Down, Depressed, Hopeless 0 0 0  PHQ - 2 Score 0 0 0    History Amber Williams has a past medical history of Allergy; Arthritis; Blood transfusion without reported diagnosis; Depression; DVT (deep venous thrombosis) ([place]) (2013); Gait abnormality (08/03/2016); Hyperlipidemia; Hypertension; Multiple sclerosis ([place]); Neurogenic bladder (08/03/2016); Neuromuscular disorder ([place]); Right foot drop (08/03/2016); Thyroid disease; and Ulcer.   She has a past surgical history that includes Colon surgery; Gastric bypass (2010); Interstim Implant placement (2014); and Portacath placement.   Her family history includes Breast cancer in her mother; Cancer in her mother; Diabetes in her father, maternal grandmother, and paternal grandmother; Heart disease in her maternal grandfather, maternal grandmother, and paternal grandfather; Hypertension in her father and sister; Miscarriages / [name] in her sister; Stroke in her paternal grandfather.She reports that she quit smoking about 7 years ago. She started smoking about 25 years ago. She has never used smokeless tobacco. She reports that she drinks alcohol. She reports that she does not use drugs.    ROS Review of Systems  Constitutional: Positive for fatigue. Negative for appetite change, chills, diaphoresis, fever and unexpected weight change.  HENT: Negative for congestion, ear pain, hearing loss, postnasal drip, rhinorrhea, sneezing, sore throat and trouble swallowing.   Eyes: Negative for pain.  Respiratory: Negative for cough, chest tightness and shortness of breath.   Cardiovascular:  Negative for chest pain and palpitations.  Gastrointestinal: Positive for diarrhea. Negative for abdominal pain, constipation, nausea and vomiting.  Endocrine: Negative for cold intolerance, heat intolerance, polydipsia, polyphagia and polyuria.  Genitourinary: Negative for dysuria, frequency and menstrual problem.  Musculoskeletal: Positive for arthralgias, gait problem and joint swelling.  Skin: Negative for rash.  Allergic/Immunologic: Negative for environmental allergies.  Neurological: Positive for weakness (worse on right side. Uses cane due to right leg weakness). Negative for dizziness, numbness and headaches.  Psychiatric/Behavioral: Negative for agitation, confusion and dysphoric mood.    Objective:  BP 111/71 (BP Location: Right Arm)   Pulse 89   Temp (!) 97.3 F (36.3 C) (Oral)   Ht 5' 5"  (1.651 m)   Wt 187 lb (84.8 kg)   LMP 09/14/2016 (Approximate)   BMI 31.12 kg/m   BP Readings from Last 3 Encounters:  01/15/17 111/71  12/06/16 124/76  11/09/16 108/70    Wt Readings from Last 3 Encounters:  01/15/17 187 lb (84.8 kg)  12/06/16 198 lb (89.8 kg)  11/09/16 203 lb 9.6 oz (92.4 kg)     Physical Exam  Constitutional: She is oriented to person, place, and time. She appears well-developed and well-nourished. No distress.  HENT:  Head: Normocephalic and atraumatic.  Right Ear: External ear normal.  Left Ear: External ear normal.  Nose: Nose normal.  Mouth/Throat: Oropharynx is clear and moist.  Eyes: Pupils are equal, round, and reactive to light. Conjunctivae and EOM are normal.  Neck: Normal range of motion. Neck supple. No thyromegaly present.  Cardiovascular: Normal rate, regular rhythm and normal heart sounds.   No murmur heard. Pulmonary/Chest: Effort normal and breath sounds normal. No respiratory distress. She has no wheezes. She has  no rales. Right breast exhibits no inverted nipple, no mass and no tenderness. Left breast exhibits no inverted nipple, no  mass and no tenderness. Breasts are symmetrical.  Abdominal: Soft. Normal appearance and bowel sounds are normal. She exhibits no distension, no abdominal bruit and no mass. There is no splenomegaly or hepatomegaly. There is no tenderness. There is no tenderness at McBurney's point and negative Murphy's sign.  Genitourinary: Rectum normal. Rectal exam shows no external hemorrhoid, no internal hemorrhoid, no mass and no tenderness.  Musculoskeletal: Normal range of motion. She exhibits no edema or tenderness.  Lymphadenopathy:    She has no cervical adenopathy.  Neurological: She is alert and oriented to person, place, and time. She has normal reflexes. She exhibits abnormal muscle tone (rightmleg smaller,weaker than left). Coordination abnormal.  Skin: Skin is warm and dry. No rash noted.  Psychiatric: She has a normal mood and affect. Her behavior is normal. Judgment and thought content normal.      Assessment & Plan:   Amber Williams was seen today for annual exam.  Diagnoses and all orders for this visit:  Well adult exam -     CBC with Differential/Platelet -     CMP14+EGFR -     Lipid panel -     Urinalysis, Complete -     TSH -     T4, Free  Hypothyroidism, unspecified type -     CMP14+EGFR -     TSH -     T4, Free  Primary insomnia -     CMP14+EGFR  Multiple sclerosis (HCC) -     CMP14+EGFR  Other orders -     mirtazapine (REMERON) 45 MG tablet; Take 1 tablet (45 mg total) by mouth at bedtime.       I have discontinued [name] naproxen, traZODone, and PARoxetine. I am also having her start on mirtazapine. Additionally, I am having her maintain her levothyroxine, omeprazole, Vitamin D3, Ocrelizumab (OCREVUS IV), and baclofen.  Allergies as of 01/15/2017      Reactions   Tecfidera [dimethyl Fumarate] Itching      Medication List       Accurate as of 01/15/17 10:29 AM. Always use your most recent med list.          baclofen 10 MG tablet Commonly known  as:  LIORESAL Take 1 tablet (10 mg total) by mouth at bedtime as needed for muscle spasms.   levothyroxine 100 MCG tablet Commonly known as:  SYNTHROID, LEVOTHROID Take 100 mcg by mouth daily.   mirtazapine 45 MG tablet Commonly known as:  REMERON Take 1 tablet (45 mg total) by mouth at bedtime.   OCREVUS IV Inject into the vein.   omeprazole 20 MG capsule Commonly known as:  PRILOSEC Take 20 mg by mouth daily.   Vitamin D3 5000 units Caps Take 5,000 Units by mouth every morning.            Discharge Care Instructions        Start     Ordered   01/15/17 0000  mirtazapine (REMERON) 45 MG tablet  Daily at bedtime     01/15/17 1021   01/15/17 0000  CBC with Differential/Platelet     01/15/17 1021   01/15/17 0000  CMP14+EGFR     01/15/17 1021   01/15/17 0000  Lipid panel     01/15/17 1021   01/15/17 0000  Urinalysis, Complete     01/15/17 1021   01/15/17 0000  TSH  01/15/17 1021   01/15/17 0000  T4, Free     01/15/17 1021       Follow-up: Return in about 6 months (around 07/15/2017).  [name], M.D.

## 2017-01-15 NOTE — Addendum Note (Signed)
Addended by: Zannie Cove on: 01/15/2017 02:06 PM   Modules accepted: Orders

## 2017-01-16 ENCOUNTER — Other Ambulatory Visit: Payer: Medicare HMO

## 2017-01-16 DIAGNOSIS — Z0000 Encounter for general adult medical examination without abnormal findings: Secondary | ICD-10-CM

## 2017-01-16 DIAGNOSIS — N319 Neuromuscular dysfunction of bladder, unspecified: Secondary | ICD-10-CM | POA: Diagnosis not present

## 2017-01-16 LAB — PAP IG W/ RFLX HPV ASCU: PAP Smear Comment: 0

## 2017-01-16 LAB — CBC WITH DIFFERENTIAL/PLATELET
BASOS: 1 %
Basophils Absolute: 0 10*3/uL (ref 0.0–0.2)
EOS (ABSOLUTE): 0.1 10*3/uL (ref 0.0–0.4)
EOS: 2 %
Hematocrit: 37.6 % (ref 34.0–46.6)
Hemoglobin: 12.1 g/dL (ref 11.1–15.9)
IMMATURE GRANS (ABS): 0 10*3/uL (ref 0.0–0.1)
Immature Granulocytes: 0 %
LYMPHS: 14 %
Lymphocytes Absolute: 0.9 10*3/uL (ref 0.7–3.1)
MCH: 28.6 pg (ref 26.6–33.0)
MCHC: 32.2 g/dL (ref 31.5–35.7)
MCV: 89 fL (ref 79–97)
MONOCYTES: 8 %
Monocytes Absolute: 0.5 10*3/uL (ref 0.1–0.9)
NEUTROS ABS: 4.9 10*3/uL (ref 1.4–7.0)
Neutrophils: 75 %
Platelets: 366 10*3/uL (ref 150–379)
RBC: 4.23 x10E6/uL (ref 3.77–5.28)
RDW: 17.5 % — ABNORMAL HIGH (ref 12.3–15.4)
WBC: 6.6 10*3/uL (ref 3.4–10.8)

## 2017-01-16 LAB — CMP14+EGFR
ALBUMIN: 4.1 g/dL (ref 3.5–5.5)
ALT: 13 IU/L (ref 0–32)
AST: 22 IU/L (ref 0–40)
Albumin/Globulin Ratio: 2.9 — ABNORMAL HIGH (ref 1.2–2.2)
Alkaline Phosphatase: 104 IU/L (ref 39–117)
BUN / CREAT RATIO: 10 (ref 9–23)
BUN: 10 mg/dL (ref 6–24)
Bilirubin Total: 0.4 mg/dL (ref 0.0–1.2)
CALCIUM: 9.6 mg/dL (ref 8.7–10.2)
CO2: 22 mmol/L (ref 20–29)
CREATININE: 0.97 mg/dL (ref 0.57–1.00)
Chloride: 105 mmol/L (ref 96–106)
GFR calc non Af Amer: 73 mL/min/{1.73_m2} (ref >59)
GFR, EST AFRICAN AMERICAN: 84 mL/min/{1.73_m2} (ref >59)
GLOBULIN, TOTAL: 1.4 g/dL — AB (ref 1.5–4.5)
Glucose: 71 mg/dL (ref 65–99)
Potassium: 4.3 mmol/L (ref 3.5–5.2)
SODIUM: 145 mmol/L — AB (ref 134–144)
TOTAL PROTEIN: 5.5 g/dL — AB (ref 6.0–8.5)

## 2017-01-16 LAB — LIPID PANEL
CHOL/HDL RATIO: 2.2 ratio (ref 0.0–4.4)
Cholesterol, Total: 185 mg/dL (ref 100–199)
HDL: 83 mg/dL (ref >39)
LDL CALC: 74 mg/dL (ref 0–99)
Triglycerides: 138 mg/dL (ref 0–149)
VLDL CHOLESTEROL CAL: 28 mg/dL (ref 5–40)

## 2017-01-16 LAB — TSH: TSH: 2.54 u[IU]/mL (ref 0.450–4.500)

## 2017-01-16 LAB — T4, FREE: Free T4: 1.34 ng/dL (ref 0.82–1.77)

## 2017-01-18 ENCOUNTER — Telehealth: Payer: Self-pay | Admitting: Family Medicine

## 2017-01-18 ENCOUNTER — Encounter: Payer: Self-pay | Admitting: Family Medicine

## 2017-01-18 NOTE — Telephone Encounter (Signed)
°  Returning call from nurse

## 2017-01-19 ENCOUNTER — Other Ambulatory Visit: Payer: Self-pay | Admitting: Family Medicine

## 2017-01-19 LAB — URINE CULTURE

## 2017-01-19 MED ORDER — DOXEPIN HCL 25 MG PO CAPS: 25.0000 mg | ORAL_CAPSULE | Freq: Every evening | ORAL | 2 refills | Status: DC | PRN

## 2017-01-22 ENCOUNTER — Ambulatory Visit (INDEPENDENT_AMBULATORY_CARE_PROVIDER_SITE_OTHER): Payer: Medicare HMO | Admitting: Neurology

## 2017-01-22 ENCOUNTER — Telehealth: Payer: Self-pay | Admitting: Neurology

## 2017-01-22 ENCOUNTER — Encounter: Payer: Self-pay | Admitting: Neurology

## 2017-01-22 VITALS — BP 124/79 | HR 106 | Ht 65.0 in | Wt 191.0 lb

## 2017-01-22 DIAGNOSIS — M21371 Foot drop, right foot: Secondary | ICD-10-CM

## 2017-01-22 DIAGNOSIS — G35 Multiple sclerosis: Secondary | ICD-10-CM

## 2017-01-22 DIAGNOSIS — R269 Unspecified abnormalities of gait and mobility: Secondary | ICD-10-CM | POA: Diagnosis not present

## 2017-01-22 DIAGNOSIS — N3000 Acute cystitis without hematuria: Secondary | ICD-10-CM

## 2017-01-22 MED ORDER — CIPROFLOXACIN HCL 250 MG PO TABS: 250.0000 mg | ORAL_TABLET | Freq: Two times a day (BID) | ORAL | 0 refills | Status: DC

## 2017-01-22 NOTE — Telephone Encounter (Signed)
Called and LVM for pt. Asked her to call back with serial number, model number and year interstim stimulator was put it. [place] needs this info prior to scheduling her MRI.

## 2017-01-22 NOTE — Telephone Encounter (Signed)
I called [facility] to to schedule the patients MRI's but they were unable to schedule the images with me because they stated they need the cereal number, modael number and when it was planted for the Intrastim stimulator.

## 2017-01-22 NOTE — Patient Instructions (Signed)
   We will recheck a urine study and get MRI of the brain and cervical spine.

## 2017-01-22 NOTE — Telephone Encounter (Signed)
Pt called in she experiencing fatigue, rt leg dragging since Friday. Pt She is wanting to be seen if possible.

## 2017-01-22 NOTE — Telephone Encounter (Signed)
Called and spoke with patient. Scheduled appt for today at 130pm, check in 100pm. Ok per CW,MD. She verbalized understanding and appreciation for call. I did advise patient to bring updated med list and insurance cards.

## 2017-01-22 NOTE — Progress Notes (Signed)
Reason for visit: Multiple sclerosis  Amber Williams is an 41 y.o. female  History of present illness:  [name] is a 41 year old right-handed white female with a history of multiple sclerosis associated with a gait disorder. The patient has weakness in the right leg greater than left leg at baseline, she uses a cane or a walker for ambulation. She has been on Ocrevus, initial doses were in April 2018. The patient has done well, but within the last 10 days or 2 weeks she has had increasing problems with fatigue and some onset of weakness in the legs. The patient particularly has noted problems since 01/18/2017. The patient has not had any recent falls, but it is becoming more difficult for her to walk, she has had worsening problems controlling her bladder. The patient has InterStim stimulator in place. She went to see her primary care physician last week, blood work was done and a urinalysis was done showing elevation in white blood cells and red blood cells and bacteria in the urine. A urine culture showed multiple species, the possibility of contamination was entertained. The patient has not been running any overt fevers. She returns to this office for an evaluation.  Past Medical History:  Diagnosis Date  . Allergy   . Arthritis   . Blood transfusion without reported diagnosis    2013  . Depression   . DVT (deep venous thrombosis) ([place]) 2013  . Gait abnormality 08/03/2016  . Hyperlipidemia   . Hypertension   . Multiple sclerosis ([place])   . Neurogenic bladder 08/03/2016  . Neuromuscular disorder ([place])    dx'd with MS at age of 74  . Right foot drop 08/03/2016  . Thyroid disease   . Ulcer     Past Surgical History:  Procedure Laterality Date  . COLON SURGERY     Colonoscopy  . GASTRIC BYPASS  2010  . INTERSTIM IMPLANT PLACEMENT  2014  . PORTACATH PLACEMENT      Family History  Problem Relation Age of Onset  . Cancer Mother        Dx'd at age 69  . Breast cancer  Mother   . Hypertension Father   . Diabetes Father   . Hypertension Sister   . Miscarriages / Stillbirths Sister   . Heart disease Maternal Grandmother   . Diabetes Maternal Grandmother   . Heart disease Maternal Grandfather   . Diabetes Paternal Grandmother   . Heart disease Paternal Grandfather   . Stroke Paternal Grandfather     Social history:  reports that she quit smoking about 7 years ago. She started smoking about 25 years ago. She has never used smokeless tobacco. She reports that she drinks alcohol. She reports that she does not use drugs.    Allergies  Allergen Reactions  . Tecfidera [Dimethyl Fumarate] Itching    Medications:  Prior to Admission medications   Medication Sig Start Date End Date Taking? Authorizing Provider  baclofen (LIORESAL) 10 MG tablet Take 1 tablet (10 mg total) by mouth at bedtime as needed for muscle spasms. 11/09/16  Yes Ward Givens, NP  Cholecalciferol (VITAMIN D3) 5000 units CAPS Take 5,000 Units by mouth every morning.   Yes [provider]  doxepin (SINEQUAN) 25 MG capsule Take 1 capsule (25 mg total) by mouth at bedtime as needed (sleep). 01/19/17  Yes [name], [name], MD  levothyroxine (SYNTHROID, LEVOTHROID) 100 MCG tablet Take 100 mcg by mouth daily. 01/08/16  Yes [provider]  Wess Botts (  OCREVUS IV) Inject into the vein.   Yes [provider]  omeprazole (PRILOSEC) 20 MG capsule Take 20 mg by mouth daily. 01/17/16  Yes [provider]    ROS:  Out of a complete 14 system review of symptoms, the patient complains only of the following symptoms, and all other reviewed systems are negative.  Decreased activity, fatigue Excessive thirst Diarrhea Restless legs, insomnia, frequent waking Difficulty urinating, incontinence the bladder, frequency of urination, urinary urgency Muscle cramps, walking difficulty Bruising easily Numbness, weakness Anxiety  Blood pressure 124/79, pulse (!) 106, height  5\' 5"  (1.651 m), weight 191 lb (86.6 kg).  Physical Exam  General: The patient is alert and cooperative at the time of the examination. The patient is moderately obese.  Skin: No significant peripheral edema is noted.   Neurologic Exam  Mental status: The patient is alert and oriented x 3 at the time of the examination. The patient has apparent normal recent and remote memory, with an apparently normal attention span and concentration ability.   Cranial nerves: Facial symmetry is present. Speech is normal, no aphasia or dysarthria is noted. Extraocular movements are full. Visual fields are full.  Motor: The patient has good strength in the upper extremities. With the lower symptoms, there is bilateral, right greater than left weakness with hip flexion with 2/5 strength on the right and 3/5 strength on the left. The patient has a right foot drop, she has good strength with extension of the knees bilaterally but has weakness with flexion of the knee on the right. Increased motor tone is noted with the right leg.  Sensory examination: Soft touch sensation is symmetric on the face, arms, and legs.  Coordination: The patient has good finger-nose-finger bilaterally. The patient has difficulty performing heel-to-shin on both sides.  Gait and station: The patient has a prominent circumduction gait with the right leg. She walks with a walker, she has good stability. Romberg is negative.  Reflexes: Deep tendon reflexes are symmetric in the arms, increased with the right knee jerk as compared to left.   Assessment/Plan:  1. Multiple sclerosis  2. Neurogenic bladder  3. Gait disorder  4. Right foot drop  The patient is having worsening symptoms of fatigue and increased problems with walking. The patient appears to have abnormalities in the urinalysis suggesting a bladder infection. This will be repeated today. The patient will be given a prescription for Cipro. The patient is due for another  dose of Ocrevus in the near future. She will have MRI of the brain and MRI of the cervical spine with and without gadolinium enhancement. The InterStim stimulator is MRI compatible. She has had MRI done 1 year ago through [facility] after the stimulator was placed. The patient has a revisit appointment in January 2019.  Jill Alexanders MD 01/22/2017 1:27 PM  [facility] [address] [place], [place] [address]  Phone [phone] Fax [phone]

## 2017-01-23 ENCOUNTER — Telehealth: Payer: Self-pay | Admitting: Neurology

## 2017-01-23 LAB — URINALYSIS, ROUTINE W REFLEX MICROSCOPIC
Bilirubin, UA: NEGATIVE
GLUCOSE, UA: NEGATIVE
Ketones, UA: NEGATIVE
Nitrite, UA: NEGATIVE
PH UA: 7 (ref 5.0–7.5)
PROTEIN UA: NEGATIVE
Specific Gravity, UA: 1.008 (ref 1.005–1.030)
UUROB: 0.2 mg/dL (ref 0.2–1.0)

## 2017-01-23 LAB — MICROSCOPIC EXAMINATION: CASTS: NONE SEEN /LPF

## 2017-01-23 NOTE — Telephone Encounter (Signed)
Amber Williams with [facility] scheduler informed me that the MRI tech's looked into this patients pacemaker information. They can only do the MRI brain they cannot do the MRI Cervical because she said it has never been tested before or had any trial runs. The phone number for the MRI Tech is [phone]. Please advise?

## 2017-01-23 NOTE — Telephone Encounter (Signed)
Called patient again since no return call yet. She stated that the information she can find is the following: rx only FCCID: [id number] Model#: [id number]  She was unsure of date device put in. She thinks 2016. She denies having any paperwork about procedure. She had it done through [facility] in [place], [place]. I requested she try and contact them to get this information.  She will call back in the next couple days with information.

## 2017-01-23 NOTE — Telephone Encounter (Signed)
I called radiology. Apparently the MRI does not have a proper coil to do the neck with the InterStim stimulator.  The head and neck MRI was done at [facility] last year. We will do the head only and not worry about the cervical at this time.

## 2017-01-23 NOTE — Telephone Encounter (Signed)
Called pt back. Went via EPIC again and found surgical note via [place].   Here is the following information for interstim: Implant name: NEUROSTIMULATOR, INTERSTIM II SM Item serial number: [id number] H Manufacturer: NEUROSTIMULATORPhineas Real Lot#: [id number] H Model #: [id number] [employer] neurologic technologies Date implanted: 02/05/15

## 2017-01-23 NOTE — Telephone Encounter (Signed)
Dr. Willis- FYI 

## 2017-01-23 NOTE — Telephone Encounter (Signed)
Yes perfect thank you. I called [facility] schedule to see if I can schedule the images she stated they have to research all the numbers and once they do that they will reach out to the patient to schedule the MRI's.

## 2017-01-23 NOTE — Telephone Encounter (Signed)
I called patient. The urinalysis once again shows elevated white blood cells, however there nitrite level is negative.  The patient will go on and get the antibiotic course on Cipro. If problems continue, the patient may need to see urology.

## 2017-01-24 NOTE — Telephone Encounter (Signed)
Patient wanted to be scheduled at Oct 8 due to she wanted her mother to go with her to the appointment she also told me it could not be on a Wednesday morning. She is scheduled for Oct 11 arrival time is 12:45 pm at [facility] for the MRI Brain. Patient is aware of time and day.

## 2017-02-08 ENCOUNTER — Telehealth: Payer: Self-pay | Admitting: Neurology

## 2017-02-08 ENCOUNTER — Ambulatory Visit (HOSPITAL_COMMUNITY)
Admission: RE | Admit: 2017-02-08 | Discharge: 2017-02-08 | Disposition: A | Discharge status: Home / Self Care | Payer: Medicare HMO | Source: Ambulatory Visit | Attending: Neurology | Admitting: Neurology

## 2017-02-08 DIAGNOSIS — G35 Multiple sclerosis: Secondary | ICD-10-CM | POA: Diagnosis not present

## 2017-02-08 MED ORDER — GADOBENATE DIMEGLUMINE 529 MG/ML IV SOLN
20.0000 mL | Freq: Once | INTRAVENOUS | Status: AC
  Administered 2017-02-08: 18 mL via INTRAVENOUS

## 2017-02-08 MED ORDER — HEPARIN SOD (PORK) LOCK FLUSH 100 UNIT/ML IV SOLN
500.0000 [IU] | INTRAVENOUS | Status: AC | PRN
  Administered 2017-02-08: 500 [IU]

## 2017-02-08 MED ORDER — SODIUM CHLORIDE 0.9% FLUSH
10.0000 mL | INTRAVENOUS | Status: DC | PRN
  Administered 2017-02-08: 10 mL
  Filled 2017-02-08: qty 40

## 2017-02-08 NOTE — Telephone Encounter (Signed)
  The MRI of the brain shows no acute changes. Last MRI done was a [facility], unable to compare.   MRI brain 02/08/17:  IMPRESSION: Moderate cerebral white matter disease consistent with multiple sclerosis. No evidence of acute demyelination.

## 2017-02-08 NOTE — Telephone Encounter (Signed)
I called the patient. The MRI shows stable MS lesions. Do not have a comparison study.   MRI of the cervical spine could not be done due to the Interstim stimulator placement.

## 2017-02-15 DIAGNOSIS — G35 Multiple sclerosis: Secondary | ICD-10-CM | POA: Diagnosis not present

## 2017-03-19 ENCOUNTER — Ambulatory Visit (INDEPENDENT_AMBULATORY_CARE_PROVIDER_SITE_OTHER): Payer: Medicare HMO | Admitting: Family Medicine

## 2017-03-19 ENCOUNTER — Encounter: Payer: Self-pay | Admitting: Family Medicine

## 2017-03-19 VITALS — BP 118/82 | HR 87 | Temp 97.5°F | Ht 65.0 in | Wt 185.0 lb

## 2017-03-19 DIAGNOSIS — F5101 Primary insomnia: Secondary | ICD-10-CM | POA: Diagnosis not present

## 2017-03-19 DIAGNOSIS — R300 Dysuria: Secondary | ICD-10-CM

## 2017-03-19 DIAGNOSIS — G35 Multiple sclerosis: Secondary | ICD-10-CM | POA: Diagnosis not present

## 2017-03-19 DIAGNOSIS — Z23 Encounter for immunization: Secondary | ICD-10-CM | POA: Diagnosis not present

## 2017-03-19 DIAGNOSIS — R109 Unspecified abdominal pain: Secondary | ICD-10-CM | POA: Diagnosis not present

## 2017-03-19 LAB — URINALYSIS
BILIRUBIN UA: NEGATIVE
GLUCOSE, UA: NEGATIVE
Nitrite, UA: NEGATIVE
PH UA: 5.5 (ref 5.0–7.5)
PROTEIN UA: NEGATIVE
Specific Gravity, UA: 1.02 (ref 1.005–1.030)
Urobilinogen, Ur: 0.2 mg/dL (ref 0.2–1.0)

## 2017-03-19 MED ORDER — DOXEPIN HCL 50 MG PO CAPS: 50.0000 mg | ORAL_CAPSULE | Freq: Every day | ORAL | 1 refills | Status: DC

## 2017-03-19 MED ORDER — BACLOFEN 10 MG PO TABS: 10.0000 mg | ORAL_TABLET | Freq: Every evening | ORAL | 1 refills | Status: DC | PRN

## 2017-03-19 MED ORDER — CIPROFLOXACIN HCL 500 MG PO TABS: 500.0000 mg | ORAL_TABLET | Freq: Two times a day (BID) | ORAL | 0 refills | Status: DC

## 2017-03-19 NOTE — Progress Notes (Signed)
Subjective:  Patient ID: Amber Williams, female    DOB: Aug 08, 1975  Age: 41 y.o. MRN: [id number]  CC: Flank Pain (pt here today c/o flank pain and also wants to discuss increasing baclofen and the doxepin didn't do any good. She is still having insomnia.)   HPI Amber Williams presents for burning with urination and frequency for several days. Denies fever . No flank pain. No nausea, vomiting.Osme ongoing sx since last OV. In the past required higher doses of cipro. ALso no better sleep with doxepin. Still not getting to sleep and only getting 1-2 hours total. Additionally stiff. Muscles tight. Baclofen helped in the past.    Depression screen [facility] 2/9 03/19/2017 01/15/2017 12/06/2016  Decreased Interest 1 0 0  Down, Depressed, Hopeless 0 0 0  PHQ - 2 Score 1 0 0    History Amber Williams has a past medical history of Allergy, Arthritis, Blood transfusion without reported diagnosis, Depression, DVT (deep venous thrombosis) ([place]) (2013), Gait abnormality (08/03/2016), Hyperlipidemia, Hypertension, Multiple sclerosis ([place]), Neurogenic bladder (08/03/2016), Neuromuscular disorder ([place]), Right foot drop (08/03/2016), Thyroid disease, and Ulcer.   She has a past surgical history that includes Colon surgery; Gastric bypass (2010); Interstim Implant placement (2014); and Portacath placement.   Her family history includes Breast cancer in her mother; Cancer in her mother; Diabetes in her father, maternal grandmother, and paternal grandmother; Heart disease in her maternal grandfather, maternal grandmother, and paternal grandfather; Hypertension in her father and sister; Miscarriages / [name] in her sister; Stroke in her paternal grandfather.She reports that she quit smoking about 8 years ago. She started smoking about 25 years ago. she has never used smokeless tobacco. She reports that she drinks alcohol. She reports that she does not use drugs.    ROS Review of Systems  Constitutional: Negative for  activity change, appetite change and fever.  HENT: Negative for congestion, rhinorrhea and sore throat.   Eyes: Negative for visual disturbance.  Respiratory: Negative for cough and shortness of breath.   Cardiovascular: Negative for chest pain and palpitations.  Gastrointestinal: Positive for abdominal pain. Negative for diarrhea and nausea.  Genitourinary: Positive for difficulty urinating, dysuria, flank pain, frequency and urgency.  Musculoskeletal: Positive for myalgias. Negative for arthralgias.  Psychiatric/Behavioral: Positive for sleep disturbance.    Objective:  BP 118/82   Pulse 87   Temp (!) 97.5 F (36.4 C) (Oral)   Ht 5\' 5"  (1.651 m)   Wt 185 lb (83.9 kg)   BMI 30.79 kg/m   BP Readings from Last 3 Encounters:  03/19/17 118/82  01/22/17 124/79  01/15/17 111/71    Wt Readings from Last 3 Encounters:  03/19/17 185 lb (83.9 kg)  01/22/17 191 lb (86.6 kg)  01/15/17 187 lb (84.8 kg)     Physical Exam  Constitutional: She is oriented to person, place, and time. She appears well-developed and well-nourished. No distress.  HENT:  Head: Normocephalic and atraumatic.  Right Ear: External ear normal.  Left Ear: External ear normal.  Nose: Nose normal.  Mouth/Throat: Oropharynx is clear and moist.  Eyes: Conjunctivae and EOM are normal. Pupils are equal, round, and reactive to light.  Neck: Normal range of motion. Neck supple. No thyromegaly present.  Cardiovascular: Normal rate, regular rhythm and normal heart sounds.  No murmur heard. Pulmonary/Chest: Effort normal and breath sounds normal. No respiratory distress. She has no wheezes. She has no rales.  Abdominal: Soft. Bowel sounds are normal. She exhibits no distension. There is no tenderness.  Lymphadenopathy:    She has no cervical adenopathy.  Neurological: She is alert and oriented to person, place, and time. She has normal reflexes.  Skin: Skin is warm and dry.  Psychiatric: She has a normal mood and  affect. Her behavior is normal. Judgment and thought content normal.      Assessment & Plan:   Amber Williams was seen today for flank pain.  Diagnoses and all orders for this visit:  Flank pain -     Urine Culture -     Urinalysis -     ciprofloxacin (CIPRO) 500 MG tablet; Take 1 tablet (500 mg total) 2 (two) times daily by mouth.  Dysuria -     Urine Culture -     Urinalysis -     ciprofloxacin (CIPRO) 500 MG tablet; Take 1 tablet (500 mg total) 2 (two) times daily by mouth.  Need for immunization against influenza -     Flu Vaccine QUAD 36+ mos IM  Primary insomnia -     doxepin (SINEQUAN) 50 MG capsule; Take 1 capsule (50 mg total) at bedtime by mouth.  Multiple sclerosis (HCC) -     baclofen (LIORESAL) 10 MG tablet; Take 1 tablet (10 mg total) at bedtime as needed by mouth for muscle spasms.       I have discontinued Amber Williams's doxepin and ciprofloxacin. I have also changed her baclofen. Additionally, I am having her start on doxepin and ciprofloxacin. Lastly, I am having her maintain her levothyroxine, omeprazole, Vitamin D3, and Ocrelizumab (OCREVUS IV).  Allergies as of 03/19/2017      Reactions   Other Hives   hives   Tecfidera [dimethyl Fumarate] Itching      Medication List        Accurate as of 03/19/17 10:14 PM. Always use your most recent med list.          baclofen 10 MG tablet Commonly known as:  LIORESAL Take 1 tablet (10 mg total) at bedtime as needed by mouth for muscle spasms.   ciprofloxacin 500 MG tablet Commonly known as:  CIPRO Take 1 tablet (500 mg total) 2 (two) times daily by mouth.   doxepin 50 MG capsule Commonly known as:  SINEQUAN Take 1 capsule (50 mg total) at bedtime by mouth.   levothyroxine 100 MCG tablet Commonly known as:  SYNTHROID, LEVOTHROID Take 100 mcg by mouth daily.   OCREVUS IV Inject into the vein.   omeprazole 20 MG capsule Commonly known as:  PRILOSEC Take 20 mg by mouth daily.   Vitamin D3  5000 units Caps Take 5,000 Units by mouth every morning.        Follow-up: No Follow-up on file.  [name], M.D.

## 2017-03-21 ENCOUNTER — Other Ambulatory Visit: Payer: Self-pay | Admitting: Family Medicine

## 2017-03-21 LAB — URINE CULTURE

## 2017-03-21 MED ORDER — SULFAMETHOXAZOLE-TRIMETHOPRIM 800-160 MG PO TABS: 1.0000 | ORAL_TABLET | Freq: Two times a day (BID) | ORAL | 0 refills | Status: DC

## 2017-03-26 ENCOUNTER — Encounter: Payer: Self-pay | Admitting: Family Medicine

## 2017-03-26 DIAGNOSIS — G35 Multiple sclerosis: Secondary | ICD-10-CM

## 2017-03-26 NOTE — Telephone Encounter (Signed)
Forward to pcp/[name]

## 2017-03-27 MED ORDER — LEVOTHYROXINE SODIUM 100 MCG PO TABS: 100.0000 ug | ORAL_TABLET | Freq: Every day | ORAL | 3 refills | Status: DC

## 2017-03-27 MED ORDER — FLUTICASONE PROPIONATE 50 MCG/ACT NA SUSP: 2.0000 | Freq: Every day | NASAL | 6 refills | Status: DC

## 2017-03-27 MED ORDER — BACLOFEN 10 MG PO TABS
10.0000 mg | ORAL_TABLET | Freq: Two times a day (BID) | ORAL | 5 refills | Status: DC
Start: 2017-03-27 — End: 2017-06-27

## 2017-04-17 ENCOUNTER — Ambulatory Visit (INDEPENDENT_AMBULATORY_CARE_PROVIDER_SITE_OTHER): Payer: Medicare HMO | Admitting: Family Medicine

## 2017-04-17 ENCOUNTER — Encounter: Payer: Self-pay | Admitting: Family Medicine

## 2017-04-17 DIAGNOSIS — F5101 Primary insomnia: Secondary | ICD-10-CM | POA: Diagnosis not present

## 2017-04-17 MED ORDER — DOXEPIN HCL 50 MG PO CAPS: 50.0000 mg | ORAL_CAPSULE | Freq: Every day | ORAL | 1 refills | Status: DC

## 2017-04-17 NOTE — Progress Notes (Signed)
Subjective:  Patient ID: Amber Williams, female    DOB: August 23, 1975  Age: 41 y.o. MRN: [id number]  CC: Medication Management (pt here today following up after increasing the doxepin which she is doing well on)   HPI [name] presents for follow-up of her sleep disorder.  Patient has multiple sclerosis and has a great deal of difficulty getting to sleep.  She states that she is doing much better and getting a good night sleep.  However, she is having some difficulty getting to sleep.  She says that she will doze off and then wake up and does again and wake up for the first hour or 2.  Depression screen [facility] 2/9 04/17/2017 03/19/2017 01/15/2017  Decreased Interest 0 1 0  Down, Depressed, Hopeless 0 0 0  PHQ - 2 Score 0 1 0    History Mattalyn has a past medical history of Allergy, Arthritis, Blood transfusion without reported diagnosis, Depression, DVT (deep venous thrombosis) ([place]) (2013), Gait abnormality (08/03/2016), Hyperlipidemia, Hypertension, Multiple sclerosis ([place]), Neurogenic bladder (08/03/2016), Neuromuscular disorder ([place]), Right foot drop (08/03/2016), Thyroid disease, and Ulcer.   She has a past surgical history that includes Colon surgery; Gastric bypass (2010); Interstim Implant placement (2014); and Portacath placement.   Her family history includes Breast cancer in her mother; Cancer in her mother; Diabetes in her father, maternal grandmother, and paternal grandmother; Heart disease in her maternal grandfather, maternal grandmother, and paternal grandfather; Hypertension in her father and sister; Miscarriages / [name] in her sister; Stroke in her paternal grandfather.She reports that she quit smoking about 8 years ago. She started smoking about 25 years ago. she has never used smokeless tobacco. She reports that she drinks alcohol. She reports that she does not use drugs.    ROS Review of Systems  Constitutional: Negative for activity change, appetite change and  fever.  HENT: Negative for congestion, rhinorrhea and sore throat.   Eyes: Negative for visual disturbance.  Respiratory: Negative for cough and shortness of breath.   Cardiovascular: Negative for chest pain and palpitations.  Gastrointestinal: Negative for abdominal pain, diarrhea and nausea.  Genitourinary: Negative for dysuria.  Psychiatric/Behavioral: Positive for sleep disturbance.    Objective:  BP 122/81   Pulse 86   Temp (!) 97.4 F (36.3 C) (Oral)   Ht 5\' 5"  (1.651 m)   Wt 193 lb (87.5 kg)   BMI 32.12 kg/m   BP Readings from Last 3 Encounters:  04/17/17 122/81  03/19/17 118/82  01/22/17 124/79    Wt Readings from Last 3 Encounters:  04/17/17 193 lb (87.5 kg)  03/19/17 185 lb (83.9 kg)  01/22/17 191 lb (86.6 kg)     Physical Exam  Constitutional: She is oriented to person, place, and time. She appears well-developed and well-nourished. No distress.  Cardiovascular: Normal rate and regular rhythm.  Pulmonary/Chest: Breath sounds normal.  Neurological: She is alert and oriented to person, place, and time.  Skin: Skin is warm and dry.  Psychiatric: She has a normal mood and affect.      Assessment & Plan:   Truly was seen today for medication management.  Diagnoses and all orders for this visit:  Primary insomnia -     doxepin (SINEQUAN) 50 MG capsule; Take 1 capsule (50 mg total) by mouth at bedtime.       I have discontinued Noorah D. Dinges's ciprofloxacin and sulfamethoxazole-trimethoprim. I have also changed her doxepin. Additionally, I am having her maintain her omeprazole, Vitamin D3, Ocrelizumab (  OCREVUS IV), baclofen, levothyroxine, and fluticasone.  Allergies as of 04/17/2017      Reactions   Other Hives   hives   Tecfidera [dimethyl Fumarate] Itching      Medication List        Accurate as of 04/17/17  7:37 PM. Always use your most recent med list.          baclofen 10 MG tablet Commonly known as:  LIORESAL Take 1 tablet  (10 mg total) by mouth 2 (two) times daily.   doxepin 50 MG capsule Commonly known as:  SINEQUAN Take 1 capsule (50 mg total) by mouth at bedtime.   fluticasone 50 MCG/ACT nasal spray Commonly known as:  FLONASE Place 2 sprays into both nostrils daily.   levothyroxine 100 MCG tablet Commonly known as:  SYNTHROID, LEVOTHROID Take 1 tablet (100 mcg total) by mouth daily.   OCREVUS IV Inject into the vein.   omeprazole 20 MG capsule Commonly known as:  PRILOSEC Take 20 mg by mouth daily.   Vitamin D3 5000 units Caps Take 5,000 Units by mouth every morning.      Sleep hygiene reviewed.  I also suggested that a Tylenol PM at bedtime might be useful as well.  Follow-up: Return in about 6 months (around 10/16/2017).  [name], M.D.

## 2017-04-17 NOTE — Patient Instructions (Signed)

## 2017-05-23 ENCOUNTER — Encounter: Payer: Self-pay | Admitting: Neurology

## 2017-05-23 ENCOUNTER — Ambulatory Visit (INDEPENDENT_AMBULATORY_CARE_PROVIDER_SITE_OTHER): Payer: Medicare HMO | Admitting: Neurology

## 2017-05-23 VITALS — BP 110/78 | HR 83 | Wt 196.0 lb

## 2017-05-23 DIAGNOSIS — R269 Unspecified abnormalities of gait and mobility: Secondary | ICD-10-CM

## 2017-05-23 DIAGNOSIS — Z5181 Encounter for therapeutic drug level monitoring: Secondary | ICD-10-CM

## 2017-05-23 DIAGNOSIS — G35 Multiple sclerosis: Secondary | ICD-10-CM | POA: Diagnosis not present

## 2017-05-23 NOTE — Progress Notes (Signed)
Reason for visit: Multiple sclerosis  Amber Williams is an 42 y.o. female  History of present illness:  Amber Williams is a 42 year old right-handed white female with a history of multiple sclerosis associated with the paraparesis with right greater than left lower extremity involvement.  The patient has a neurogenic bladder, she has an Interstim stimulator in place that she does not believe controls her symptoms significantly.  She has been seen through urology.  The patient reports ongoing issues with frequent bladder infections, and difficulty voiding the bladder.  The patient has not had any falls, she uses a cane outside the house and uses a walker inside the house.  The patient has an AFO brace on the right foot, but she is unable to drive a car while using it.  She denies any new symptoms of numbness or weakness of the extremities, she denies any visual changes or problems with speech or swallowing.  She does have some memory problems, she tried to take a course in community college and had to drop out because she could not memorize anything.  She returns to the office today for an evaluation.  Past Medical History:  Diagnosis Date  . Allergy   . Arthritis   . Blood transfusion without reported diagnosis    2013  . Depression   . DVT (deep venous thrombosis) ([place]) 2013  . Gait abnormality 08/03/2016  . Hyperlipidemia   . Hypertension   . Multiple sclerosis ([place])   . Neurogenic bladder 08/03/2016  . Neuromuscular disorder ([place])    dx'd with MS at age of 89  . Right foot drop 08/03/2016  . Thyroid disease   . Ulcer     Past Surgical History:  Procedure Laterality Date  . COLON SURGERY     Colonoscopy  . GASTRIC BYPASS  2010  . INTERSTIM IMPLANT PLACEMENT  2014  . PORTACATH PLACEMENT      Family History  Problem Relation Age of Onset  . Cancer Mother        Dx'd at age 75  . Breast cancer Mother   . Hypertension Father   . Diabetes Father   . Hypertension Sister   .  Miscarriages / Stillbirths Sister   . Heart disease Maternal Grandmother   . Diabetes Maternal Grandmother   . Heart disease Maternal Grandfather   . Diabetes Paternal Grandmother   . Heart disease Paternal Grandfather   . Stroke Paternal Grandfather     Social history:  reports that she quit smoking about 8 years ago. She started smoking about 26 years ago. she has never used smokeless tobacco. She reports that she drinks alcohol. She reports that she does not use drugs.    Allergies  Allergen Reactions  . Other Hives    hives  . Tecfidera [Dimethyl Fumarate] Itching    Medications:  Prior to Admission medications   Medication Sig Start Date End Date Taking? Authorizing Provider  baclofen (LIORESAL) 10 MG tablet Take 1 tablet (10 mg total) by mouth 2 (two) times daily. 03/27/17  Yes [name], MD  Cholecalciferol (VITAMIN D3) 5000 units CAPS Take 5,000 Units by mouth every morning.   Yes [provider]  doxepin (SINEQUAN) 50 MG capsule Take 1 capsule (50 mg total) by mouth at bedtime. 04/17/17  Yes [name], [name], MD  fluticasone (FLONASE) 50 MCG/ACT nasal spray Place 2 sprays into both nostrils daily. 03/27/17  Yes [name], [name], MD  levothyroxine (SYNTHROID, LEVOTHROID) 100 MCG tablet Take 1  tablet (100 mcg total) by mouth daily. 03/27/17  Yes [name], MD  Ocrelizumab (OCREVUS IV) Inject into the vein.   Yes [provider]  omeprazole (PRILOSEC) 20 MG capsule Take 20 mg by mouth daily. 01/17/16  Yes [provider]    ROS:  Out of a complete 14 system review of symptoms, the patient complains only of the following symptoms, and all other reviewed systems are negative.  Trouble swallowing Cold intolerance Constipation Insomnia, frequent waking Incontinence of the bladder, frequency of urination, urinary urgency Back pain, muscle cramps Bruising easily Memory loss, weakness  Blood pressure 110/78, pulse 83, weight 196 lb (88.9  kg).  Physical Exam  General: The patient is alert and cooperative at the time of the examination.  The patient is moderately obese.  Skin: No significant peripheral edema is noted.   Neurologic Exam  Mental status: The patient is alert and oriented x 3 at the time of the examination. The patient has apparent normal recent and remote memory, with an apparently normal attention span and concentration ability.   Cranial nerves: Facial symmetry is present. Speech is normal, no aphasia or dysarthria is noted. Extraocular movements are full. Visual fields are full.  Pupils are equal, round, and reactive to light.  Discs are flat bilaterally.  Motor: The patient has good strength in the upper extremities.  With the lower extremities, there is 3/5 strength on the left with hip flexion, 2/5 on the right.  The patient has good strength with knee extension bilaterally, patient has 4/5 strength with knee flexion on the right and she has a prominent right foot drop.  Strength on the left is normal in this regard.  Sensory examination: Soft touch sensation is symmetric on the face and arms, decreased with the right leg as compared to the left.  Coordination: The patient has good finger-nose-finger bilaterally.  The patient is unable to perform heel-to-shin on either side.  Gait and station: The patient has a wide-based circumduction gait with the right leg.  The patient uses a cane for ambulation.  Tandem gait was not attempted.  Romberg is negative but is unsteady.  Reflexes: Deep tendon reflexes are symmetric.   MRI brain 02/08/17:  IMPRESSION: Moderate cerebral white matter disease consistent with multiple sclerosis. No evidence of acute demyelination.  * MRI scan images were reviewed online. I agree with the written report.    Assessment/Plan:  1.  Multiple sclerosis  2.  Mild memory disturbance  3.  Gait disturbance, paraparesis  The patient is on Ocrevus, she is doing well so  far on the medication.  She had an MRI of the brain done in October 2018.  She will have blood work done today.  The patient is to check with her urology physician concerning her bladder function.  She will follow-up here in 6 months.  Jill Alexanders MD 05/23/2017 10:10 AM  [facility] [address] [place], [place] [address]  Phone [phone] Fax [phone]

## 2017-05-24 LAB — CBC WITH DIFFERENTIAL/PLATELET
BASOS: 0 %
Basophils Absolute: 0 10*3/uL (ref 0.0–0.2)
EOS (ABSOLUTE): 0.2 10*3/uL (ref 0.0–0.4)
EOS: 2 %
HEMATOCRIT: 37.2 % (ref 34.0–46.6)
HEMOGLOBIN: 11.6 g/dL (ref 11.1–15.9)
Immature Grans (Abs): 0 10*3/uL (ref 0.0–0.1)
Immature Granulocytes: 0 %
LYMPHS ABS: 1 10*3/uL (ref 0.7–3.1)
Lymphs: 15 %
MCH: 28.4 pg (ref 26.6–33.0)
MCHC: 31.2 g/dL — AB (ref 31.5–35.7)
MCV: 91 fL (ref 79–97)
MONOCYTES: 9 %
Monocytes Absolute: 0.7 10*3/uL (ref 0.1–0.9)
Neutrophils Absolute: 5.1 10*3/uL (ref 1.4–7.0)
Neutrophils: 74 %
Platelets: 335 10*3/uL (ref 150–379)
RBC: 4.08 x10E6/uL (ref 3.77–5.28)
RDW: 15.4 % (ref 12.3–15.4)
WBC: 7 10*3/uL (ref 3.4–10.8)

## 2017-05-24 LAB — COMPREHENSIVE METABOLIC PANEL
ALBUMIN: 4.2 g/dL (ref 3.5–5.5)
ALT: 15 IU/L (ref 0–32)
AST: 17 IU/L (ref 0–40)
Albumin/Globulin Ratio: 2.8 — ABNORMAL HIGH (ref 1.2–2.2)
Alkaline Phosphatase: 111 IU/L (ref 39–117)
BUN / CREAT RATIO: 12 (ref 9–23)
BUN: 10 mg/dL (ref 6–24)
Bilirubin Total: 0.2 mg/dL (ref 0.0–1.2)
CALCIUM: 9.9 mg/dL (ref 8.7–10.2)
CO2: 22 mmol/L (ref 20–29)
CREATININE: 0.83 mg/dL (ref 0.57–1.00)
Chloride: 105 mmol/L (ref 96–106)
GFR, EST AFRICAN AMERICAN: 101 mL/min/{1.73_m2} (ref >59)
GFR, EST NON AFRICAN AMERICAN: 88 mL/min/{1.73_m2} (ref >59)
GLOBULIN, TOTAL: 1.5 g/dL (ref 1.5–4.5)
GLUCOSE: 78 mg/dL (ref 65–99)
Potassium: 4.4 mmol/L (ref 3.5–5.2)
SODIUM: 144 mmol/L (ref 134–144)
TOTAL PROTEIN: 5.7 g/dL — AB (ref 6.0–8.5)

## 2017-06-27 ENCOUNTER — Other Ambulatory Visit: Payer: Self-pay

## 2017-06-27 DIAGNOSIS — G35 Multiple sclerosis: Secondary | ICD-10-CM

## 2017-06-27 MED ORDER — OMEPRAZOLE 20 MG PO CPDR: 20.0000 mg | DELAYED_RELEASE_CAPSULE | Freq: Every day | ORAL | 0 refills | Status: DC

## 2017-06-27 MED ORDER — LEVOTHYROXINE SODIUM 100 MCG PO TABS: 100.0000 ug | ORAL_TABLET | Freq: Every day | ORAL | 0 refills | Status: DC

## 2017-06-27 MED ORDER — BACLOFEN 10 MG PO TABS: 10.0000 mg | ORAL_TABLET | Freq: Two times a day (BID) | ORAL | 0 refills | Status: DC

## 2017-06-29 ENCOUNTER — Encounter: Payer: Self-pay | Admitting: Family Medicine

## 2017-06-29 ENCOUNTER — Ambulatory Visit (INDEPENDENT_AMBULATORY_CARE_PROVIDER_SITE_OTHER): Payer: Medicare HMO | Admitting: Family Medicine

## 2017-06-29 VITALS — BP 138/81 | HR 88 | Temp 97.8°F | Ht 65.0 in | Wt 191.0 lb

## 2017-06-29 DIAGNOSIS — R3129 Other microscopic hematuria: Secondary | ICD-10-CM

## 2017-06-29 DIAGNOSIS — R399 Unspecified symptoms and signs involving the genitourinary system: Secondary | ICD-10-CM

## 2017-06-29 DIAGNOSIS — N10 Acute tubulo-interstitial nephritis: Secondary | ICD-10-CM | POA: Diagnosis not present

## 2017-06-29 DIAGNOSIS — G35 Multiple sclerosis: Secondary | ICD-10-CM

## 2017-06-29 LAB — URINALYSIS
Bilirubin, UA: NEGATIVE
Glucose, UA: NEGATIVE
Ketones, UA: NEGATIVE
Nitrite, UA: NEGATIVE
PH UA: 5.5 (ref 5.0–7.5)
SPEC GRAV UA: 1.025 (ref 1.005–1.030)
Urobilinogen, Ur: 0.2 mg/dL (ref 0.2–1.0)

## 2017-06-29 MED ORDER — CIPROFLOXACIN HCL 500 MG PO TABS: 500.0000 mg | ORAL_TABLET | Freq: Two times a day (BID) | ORAL | 0 refills | Status: DC

## 2017-06-29 NOTE — Progress Notes (Signed)
Subjective:  Patient ID: Amber Williams, female    DOB: [birth year]  Age: 42 y.o. MRN: [id number]  CC: Urinary Tract Infection (pt here today c/o urinary urgency, dysuria and flank pain)   HPI Amber Williams presents for burning with urination and frequency for 3-4 days. Denies fever, but has had chills for a week though . Mild flank pain. No nausea, vomiting.   Depression screen Amber Williams 06/29/2017 04/17/2017 03/19/2017  Decreased Interest 0 0 1  Down, Depressed, Hopeless 0 0 0  PHQ - 2 Score 0 0 1    History [name] has a past medical history of Allergy, Arthritis, Blood transfusion without reported diagnosis, Depression, DVT (deep venous thrombosis) ([place]) (2013), Gait abnormality (08/03/2016), Hyperlipidemia, Hypertension, Multiple sclerosis ([place]), Neurogenic bladder (08/03/2016), Neuromuscular disorder ([place]), Right foot drop (08/03/2016), Thyroid disease, and Ulcer.   She has a past surgical history that includes Colon surgery; Gastric bypass (2010); Interstim Implant placement (2014); and Portacath placement.   Her family history includes Breast cancer in her mother; Cancer in her mother; Diabetes in her father, maternal grandmother, and paternal grandmother; Heart disease in her maternal grandfather, maternal grandmother, and paternal grandfather; Hypertension in her father and sister; Miscarriages / [name] in her sister; Stroke in her paternal grandfather.She reports that she quit smoking about 8 years ago. She started smoking about 26 years ago. she has never used smokeless tobacco. She reports that she drinks alcohol. She reports that she does not use drugs.    ROS Review of Systems  Constitutional: Negative for activity change, appetite change and fever.  HENT: Negative for congestion.   Eyes: Negative for visual disturbance.  Respiratory: Negative for cough and shortness of breath.   Cardiovascular: Negative for chest pain and palpitations.  Gastrointestinal: Positive for  nausea. Negative for abdominal pain and diarrhea.  Genitourinary: Positive for difficulty urinating, dysuria, flank pain and frequency.  Musculoskeletal: Negative for arthralgias and myalgias.    Objective:  BP 138/81   Pulse 88   Temp 97.8 F (36.6 C) (Oral)   Ht 5\' 5"  (1.651 m)   Wt 191 lb (86.6 kg)   BMI 31.78 kg/m   BP Readings from Last 3 Encounters:  06/29/17 138/81  05/23/17 110/78  04/17/17 122/81    Wt Readings from Last 3 Encounters:  06/29/17 191 lb (86.6 kg)  05/23/17 196 lb (88.9 kg)  04/17/17 193 lb (87.5 kg)     Physical Exam  Constitutional: She is oriented to person, place, and time. She appears well-developed and well-nourished.  HENT:  Head: Normocephalic and atraumatic.  Cardiovascular: Normal rate and regular rhythm.  No murmur heard. Pulmonary/Chest: Effort normal and breath sounds normal.  Abdominal: Soft. Bowel sounds are normal. She exhibits no mass. There is no tenderness. There is no rebound and no guarding.  Musculoskeletal: She exhibits no tenderness.  Neurological: She is alert and oriented to person, place, and time.  Skin: Skin is warm and dry.  Psychiatric: She has a normal mood and affect. Her behavior is normal.    Assessment & Plan:   [name] was seen today for urinary tract infection.  Diagnoses and all orders for this visit:  Pyelonephritis, acute  UTI symptoms -     Urine Culture -     Urinalysis  Other microscopic hematuria  Multiple sclerosis (HCC)  Other orders -     ciprofloxacin (CIPRO) 500 MG tablet; Take 1 tablet (500 mg total) by mouth 2 (two) times daily.  I am having [name] start on ciprofloxacin. [name] Vitamin D3, Ocrelizumab (OCREVUS IV), fluticasone, doxepin, levothyroxine, baclofen, and omeprazole.  Allergies as of 06/29/2017      Reactions   Other Hives   hives   Tecfidera [dimethyl Fumarate] Itching      Medication List        Accurate as of  06/29/17  1:28 PM. Always use your most recent med list.          baclofen 10 MG tablet Commonly known as:  LIORESAL Take 1 tablet (10 mg total) by mouth 2 (two) times daily.   ciprofloxacin 500 MG tablet Commonly known as:  CIPRO Take 1 tablet (500 mg total) by mouth 2 (two) times daily.   doxepin 50 MG capsule Commonly known as:  SINEQUAN Take 1 capsule (50 mg total) by mouth at bedtime.   fluticasone 50 MCG/ACT nasal spray Commonly known as:  FLONASE Place 2 sprays into both nostrils daily.   levothyroxine 100 MCG tablet Commonly known as:  SYNTHROID, LEVOTHROID Take 1 tablet (100 mcg total) by mouth daily.   OCREVUS IV Inject into the vein.   omeprazole 20 MG capsule Commonly known as:  PRILOSEC Take 1 capsule (20 mg total) by mouth daily.   Vitamin D3 5000 units Caps Take 5,000 Units by mouth every morning.        Follow-up: Return if symptoms worsen or fail to improve.  Amber Williams, M.D.

## 2017-07-03 ENCOUNTER — Other Ambulatory Visit: Payer: Self-pay | Admitting: Family Medicine

## 2017-07-03 LAB — URINE CULTURE

## 2017-07-03 MED ORDER — AMOXICILLIN 500 MG PO CAPS: 500.0000 mg | ORAL_CAPSULE | Freq: Three times a day (TID) | ORAL | 0 refills | Status: DC

## 2017-07-04 ENCOUNTER — Ambulatory Visit: Payer: Medicare HMO

## 2017-07-06 ENCOUNTER — Telehealth: Payer: Self-pay | Admitting: *Deleted

## 2017-07-06 DIAGNOSIS — Z0289 Encounter for other administrative examinations: Secondary | ICD-10-CM

## 2017-07-06 NOTE — Telephone Encounter (Signed)
Form completed, given to Dr. Jannifer Franklin for review and signature.

## 2017-07-06 NOTE — Telephone Encounter (Signed)
Pt [employer] form on [employer].

## 2017-07-09 NOTE — Telephone Encounter (Signed)
Gave completed/signed form back to medical records to process for patient.

## 2017-07-10 ENCOUNTER — Telehealth: Payer: Self-pay | Admitting: *Deleted

## 2017-07-10 NOTE — Telephone Encounter (Signed)
Pt [employer] form faxed to [phone]

## 2017-07-23 ENCOUNTER — Ambulatory Visit (INDEPENDENT_AMBULATORY_CARE_PROVIDER_SITE_OTHER): Payer: Medicare HMO | Admitting: Family Medicine

## 2017-07-23 ENCOUNTER — Encounter: Payer: Self-pay | Admitting: Family Medicine

## 2017-07-23 VITALS — BP 116/60 | HR 98 | Temp 97.1°F | Ht 65.0 in | Wt 192.4 lb

## 2017-07-23 DIAGNOSIS — E039 Hypothyroidism, unspecified: Secondary | ICD-10-CM | POA: Diagnosis not present

## 2017-07-23 DIAGNOSIS — G35 Multiple sclerosis: Secondary | ICD-10-CM

## 2017-07-23 MED ORDER — LEVOTHYROXINE SODIUM 100 MCG PO TABS: 100.0000 ug | ORAL_TABLET | Freq: Every day | ORAL | 0 refills | Status: DC

## 2017-07-23 MED ORDER — DOXEPIN HCL 75 MG PO CAPS: 75.0000 mg | ORAL_CAPSULE | Freq: Every day | ORAL | 0 refills | Status: DC

## 2017-07-23 MED ORDER — FEXOFENADINE HCL 180 MG PO TABS: 180.0000 mg | ORAL_TABLET | Freq: Every day | ORAL | 3 refills | Status: DC

## 2017-07-23 MED ORDER — BACLOFEN 10 MG PO TABS: 10.0000 mg | ORAL_TABLET | Freq: Two times a day (BID) | ORAL | 0 refills | Status: DC

## 2017-07-23 MED ORDER — OMEPRAZOLE 20 MG PO CPDR: 20.0000 mg | DELAYED_RELEASE_CAPSULE | Freq: Every day | ORAL | 0 refills | Status: DC
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
## 2017-07-24 LAB — CMP14+EGFR
ALBUMIN: 4.2 g/dL (ref 3.5–5.5)
ALK PHOS: 124 IU/L — AB (ref 39–117)
ALT: 17 IU/L (ref 0–32)
AST: 21 IU/L (ref 0–40)
Albumin/Globulin Ratio: 2.6 — ABNORMAL HIGH (ref 1.2–2.2)
BILIRUBIN TOTAL: 0.2 mg/dL (ref 0.0–1.2)
BUN/Creatinine Ratio: 10 (ref 9–23)
BUN: 8 mg/dL (ref 6–24)
CHLORIDE: 105 mmol/L (ref 96–106)
CO2: 25 mmol/L (ref 20–29)
CREATININE: 0.81 mg/dL (ref 0.57–1.00)
Calcium: 9.6 mg/dL (ref 8.7–10.2)
GFR calc Af Amer: 104 mL/min/{1.73_m2} (ref >59)
GFR calc non Af Amer: 90 mL/min/{1.73_m2} (ref >59)
Globulin, Total: 1.6 g/dL (ref 1.5–4.5)
Glucose: 65 mg/dL (ref 65–99)
Potassium: 4.2 mmol/L (ref 3.5–5.2)
Sodium: 143 mmol/L (ref 134–144)
Total Protein: 5.8 g/dL — ABNORMAL LOW (ref 6.0–8.5)

## 2017-07-24 LAB — TSH: TSH: 2.96 u[IU]/mL (ref 0.450–4.500)

## 2017-07-24 LAB — T4, FREE: Free T4: 1.37 ng/dL (ref 0.82–1.77)

## 2017-09-17 ENCOUNTER — Other Ambulatory Visit: Payer: Self-pay | Admitting: Family Medicine

## 2017-09-30 ENCOUNTER — Other Ambulatory Visit: Payer: Self-pay | Admitting: Family Medicine

## 2017-09-30 DIAGNOSIS — G35 Multiple sclerosis: Secondary | ICD-10-CM

## 2017-10-22 ENCOUNTER — Ambulatory Visit: Payer: Medicare HMO | Admitting: Family Medicine

## 2017-10-30 ENCOUNTER — Encounter: Payer: Self-pay | Admitting: Family Medicine

## 2017-10-30 ENCOUNTER — Ambulatory Visit (INDEPENDENT_AMBULATORY_CARE_PROVIDER_SITE_OTHER): Payer: Medicare HMO | Admitting: Family Medicine

## 2017-10-30 VITALS — BP 114/73 | HR 106 | Temp 97.2°F | Ht 65.0 in | Wt 201.5 lb

## 2017-10-30 DIAGNOSIS — R635 Abnormal weight gain: Secondary | ICD-10-CM

## 2017-10-30 DIAGNOSIS — G35 Multiple sclerosis: Secondary | ICD-10-CM

## 2017-10-30 DIAGNOSIS — E039 Hypothyroidism, unspecified: Secondary | ICD-10-CM | POA: Diagnosis not present

## 2017-10-30 DIAGNOSIS — J300 Vasomotor rhinitis: Secondary | ICD-10-CM | POA: Diagnosis not present

## 2017-10-30 MED ORDER — MOMETASONE FUROATE 50 MCG/ACT NA SUSP: 2.0000 | Freq: Every day | NASAL | 12 refills | Status: DC

## 2017-10-30 MED ORDER — BACLOFEN 10 MG PO TABS: 10.0000 mg | ORAL_TABLET | Freq: Two times a day (BID) | ORAL | 1 refills | Status: DC

## 2017-10-30 NOTE — Patient Instructions (Signed)

## 2017-10-31 ENCOUNTER — Encounter: Payer: Self-pay | Admitting: Family Medicine

## 2017-10-31 ENCOUNTER — Telehealth: Payer: Self-pay | Admitting: Family Medicine

## 2017-10-31 LAB — TSH: TSH: 2.08 u[IU]/mL (ref 0.450–4.500)

## 2017-10-31 LAB — T4, FREE: Free T4: 1.24 ng/dL (ref 0.82–1.77)

## 2017-10-31 MED ORDER — DOXEPIN HCL 75 MG PO CAPS: 150.0000 mg | ORAL_CAPSULE | Freq: Every day | ORAL | 1 refills | Status: DC

## 2017-10-31 MED ORDER — ZOLPIDEM TARTRATE 5 MG PO TABS: 5.0000 mg | ORAL_TABLET | Freq: Every evening | ORAL | 2 refills | Status: DC | PRN

## 2017-10-31 NOTE — Telephone Encounter (Signed)
Duplicate message. 

## 2017-10-31 NOTE — Telephone Encounter (Signed)
I sent in the requested prescription 

## 2017-10-31 NOTE — Telephone Encounter (Signed)
Patient aware, provider is not available until next week.   She is waiting on a new script for Ambien.   The need for a new sleep medication was discussed at office visit 10-30-17 with provider.

## 2017-10-31 NOTE — Telephone Encounter (Signed)
Patient aware, script is ready. 

## 2017-10-31 NOTE — Progress Notes (Addendum)
Subjective:  Patient ID: Amber Williams, female    DOB: Aug 01, 1975  Age: 41 y.o. MRN: [id number]  CC: Medical Management of Chronic Issues   HPI Amber Williams presents for patient presents for follow-up on  thyroid. The patient has a history of hypothyroidism for many years. It has been stable recently. Pt. denies any change in  voice, loss of hair, heat or cold intolerance. Energy level has been adequate but limited by her MS.(treated withOcrevus). Patient denies constipation and diarrhea. No myxedema. Medication is as noted below. Verified that pt is taking it daily on an empty stomach. Well tolerated.  Sinequan has not helped with sleep.  She went to the full dose of 2 tablets daily.  She reports nightmares with trazodone  Patient says she has no plans for July 4 holiday she will be just staying home alone.  Depression screen [facility] 2/9 10/30/2017 07/23/2017 06/29/2017  Decreased Interest 0 0 0  Down, Depressed, Hopeless 1 0 0  PHQ - 2 Score 1 0 0    History Amber Williams has a past medical history of Allergy, Arthritis, Blood transfusion without reported diagnosis, Depression, DVT (deep venous thrombosis) ([place]) (2013), Gait abnormality (08/03/2016), Hyperlipidemia, Hypertension, Multiple sclerosis ([place]), Neurogenic bladder (08/03/2016), Neuromuscular disorder ([place]), Right foot drop (08/03/2016), Thyroid disease, and Ulcer.   She has a past surgical history that includes Colon surgery; Gastric bypass (2010); Interstim Implant placement (2014); and Portacath placement.   Her family history includes Breast cancer in her mother; Cancer in her mother; Diabetes in her father, maternal grandmother, and paternal grandmother; Heart disease in her maternal grandfather, maternal grandmother, and paternal grandfather; Hypertension in her father and sister; Miscarriages / [name] in her sister; Stroke in her paternal grandfather.She reports that she quit smoking about 8 years ago. She started smoking about 26  years ago. She has never used smokeless tobacco. She reports that she drinks alcohol. She reports that she does not use drugs.    ROS Review of Systems  Constitutional: Negative.   HENT: Positive for congestion (Not relieved with the Flonase.) and postnasal drip.   Eyes: Negative for visual disturbance.  Respiratory: Negative for shortness of breath.   Cardiovascular: Negative for chest pain.  Gastrointestinal: Negative for abdominal pain, constipation, diarrhea, nausea and vomiting.  Endocrine: Negative for cold intolerance and heat intolerance.  Genitourinary: Negative for difficulty urinating.  Musculoskeletal: Positive for arthralgias and gait problem. Negative for myalgias.  Neurological: Positive for weakness. Negative for speech difficulty and headaches.  Psychiatric/Behavioral: Negative for sleep disturbance.    Objective:  BP 114/73   Pulse (!) 106   Temp (!) 97.2 F (36.2 C) (Oral)   Ht 5\' 5"  (1.651 m)   Wt 201 lb 8 oz (91.4 kg)   BMI 33.53 kg/m   BP Readings from Last 3 Encounters:  10/30/17 114/73  07/23/17 116/60  06/29/17 138/81    Wt Readings from Last 3 Encounters:  10/30/17 201 lb 8 oz (91.4 kg)  07/23/17 192 lb 6 oz (87.3 kg)  06/29/17 191 lb (86.6 kg)     Physical Exam  Constitutional: She is oriented to person, place, and time. She appears well-developed and well-nourished. No distress.  Cardiovascular: Normal rate and regular rhythm.  Pulmonary/Chest: Breath sounds normal.  Neurological: She is alert and oriented to person, place, and time. Coordination abnormal.  Skin: Skin is warm and dry.  Psychiatric: She has a normal mood and affect.      Assessment & Plan:  Amber Williams was seen today for medical management of chronic issues.  Diagnoses and all orders for this visit:  Hypothyroidism, unspecified type  Multiple sclerosis ([place]) -     baclofen (LIORESAL) 10 MG tablet; Take 1 tablet (10 mg total) by mouth 2 (two) times daily.  Weight  gain -     TSH -     T4, Free  Other orders -     mometasone (NASONEX) 50 MCG/ACT nasal spray; Place 2 sprays into the nose daily. -     zolpidem (AMBIEN) 5 MG tablet; Take 1 tablet (5 mg total) by mouth at bedtime as needed for sleep. -     doxepin (SINEQUAN) 75 MG capsule; Take 2 capsules (150 mg total) by mouth at bedtime.       I have discontinued Amber Williams's fluticasone, fexofenadine, and baclofen. I have also changed her baclofen and doxepin. Additionally, I am having her start on mometasone and zolpidem. Lastly, I am having her maintain her Vitamin D3, Ocrelizumab (OCREVUS IV), levothyroxine, and omeprazole.  Allergies as of 10/30/2017      Reactions   Other Hives   hives   Tecfidera [dimethyl Fumarate] Itching      Medication List        Accurate as of 10/30/17 11:59 PM. Always use your most recent med list.          baclofen 10 MG tablet Commonly known as:  LIORESAL Take 1 tablet (10 mg total) by mouth 2 (two) times daily.   doxepin 75 MG capsule Commonly known as:  SINEQUAN Take 2 capsules (150 mg total) by mouth at bedtime.   levothyroxine 100 MCG tablet Commonly known as:  SYNTHROID, LEVOTHROID TAKE 1 TABLET DAILY   mometasone 50 MCG/ACT nasal spray Commonly known as:  NASONEX Place 2 sprays into the nose daily.   OCREVUS IV Inject into the vein.   omeprazole 20 MG capsule Commonly known as:  PRILOSEC TAKE 1 CAPSULE DAILY   Vitamin D3 5000 units Caps Take 5,000 Units by mouth every morning.   zolpidem 5 MG tablet Commonly known as:  AMBIEN Take 1 tablet (5 mg total) by mouth at bedtime as needed for sleep.        Follow-up: Return in about 3 months (around 01/30/2018).  [name], M.D.

## 2017-11-20 ENCOUNTER — Ambulatory Visit (INDEPENDENT_AMBULATORY_CARE_PROVIDER_SITE_OTHER): Payer: Medicare HMO | Admitting: *Deleted

## 2017-11-20 VITALS — BP 105/61 | HR 86 | Ht 65.0 in | Wt 202.4 lb

## 2017-11-20 DIAGNOSIS — Z0000 Encounter for general adult medical examination without abnormal findings: Secondary | ICD-10-CM

## 2017-11-20 NOTE — Patient Instructions (Addendum)
Keep Follow up appointment with Dr. Livia Snellen on 02/04/18 Try to eat 3 healthy meals daily Look over Advance Directive packet    Amber Williams , Thank you for taking time to come for your Medicare Wellness Visit. I appreciate your ongoing commitment to your health goals. Please review the following plan we discussed and let me know if I can assist you in the future.   These are the goals we discussed: Goals    . Weight (lb) < 160 lb (72.6 kg)     Eat 3 meals daily that consist of lean proteins, fruits and vegetables. Increase water intake Try to walk for at least 30 minutes 3 times weekly       This is a list of the screening recommended for you and due dates:  Health Maintenance  Topic Date Due  . HIV Screening  01/13/1991  . Flu Shot  11/29/2017  . Tetanus Vaccine  09/22/2019  . Pap Smear  01/16/2020    Advance Directive Advance directives are legal documents that let you make choices ahead of time about your health care and medical treatment in case you become unable to communicate for yourself. Advance directives are a way for you to communicate your wishes to family, friends, and health care providers. This can help convey your decisions about end-of-life care if you become unable to communicate. Discussing and writing advance directives should happen over time rather than all at once. Advance directives can be changed depending on your situation and what you want, even after you have signed the advance directives. If you do not have an advance directive, some states assign family decision makers to act on your behalf based on how closely you are related to them. Each state has its own laws regarding advance directives. You may want to check with your health care provider, attorney, or state representative about the laws in your state. There are different types of advance directives, such as:  Medical power of attorney.  Living will.  Do not resuscitate (DNR) or do not attempt  resuscitation (DNAR) order.  Health care proxy and medical power of attorney A health care proxy, also called a health care agent, is a person who is appointed to make medical decisions for you in cases in which you are unable to make the decisions yourself. Generally, people choose someone they know well and trust to represent their preferences. Make sure to ask this person for an agreement to act as your proxy. A proxy may have to exercise judgment in the event of a medical decision for which your wishes are not known. A medical power of attorney is a legal document that names your health care proxy. Depending on the laws in your state, after the document is written, it may also need to be:  Signed.  Notarized.  Dated.  Copied.  Witnessed.  Incorporated into your medical record.  You may also want to appoint someone to manage your financial affairs in a situation in which you are unable to do so. This is called a durable power of attorney for finances. It is a separate legal document from the durable power of attorney for health care. You may choose the same person or someone different from your health care proxy to act as your agent in financial matters. If you do not appoint a proxy, or if there is a concern that the proxy is not acting in your best interests, a court-appointed guardian may be designated to act on your  behalf. Living will A living will is a set of instructions documenting your wishes about medical care when you cannot express them yourself. Health care providers should keep a copy of your living will in your medical record. You may want to give a copy to family members or friends. To alert caregivers in case of an emergency, you can place a card in your wallet to let them know that you have a living will and where they can find it. A living will is used if you become:  Terminally ill.  Incapacitated.  Unable to communicate or make decisions.  Items to consider in  your living will include:  The use or non-use of life-sustaining equipment, such as dialysis machines and breathing machines (ventilators).  A DNR or DNAR order, which is the instruction not to use cardiopulmonary resuscitation (CPR) if breathing or heartbeat stops.  The use or non-use of tube feeding.  Withholding of food and fluids.  Comfort (palliative) care when the goal becomes comfort rather than a cure.  Organ and tissue donation.  A living will does not give instructions for distributing your money and property if you should pass away. It is recommended that you seek the advice of a lawyer when writing a will. Decisions about taxes, beneficiaries, and asset distribution will be legally binding. This process can relieve your family and friends of any concerns surrounding disputes or questions that may come up about the distribution of your assets. DNR or DNAR A DNR or DNAR order is a request not to have CPR in the event that your heart stops beating or you stop breathing. If a DNR or DNAR order has not been made and shared, a health care provider will try to help any patient whose heart has stopped or who has stopped breathing. If you plan to have surgery, talk with your health care provider about how your DNR or DNAR order will be followed if problems occur. Summary  Advance directives are the legal documents that allow you to make choices ahead of time about your health care and medical treatment in case you become unable to communicate for yourself.  The process of discussing and writing advance directives should happen over time. You can change the advance directives, even after you have signed them.  Advance directives include DNR or DNAR orders, living wills, and designating an agent as your medical power of attorney. This information is not intended to replace advice given to you by your health care provider. Make sure you discuss any questions you have with your health care  provider. Document Released: 07/25/2007 Document Revised: 03/06/2016 Document Reviewed: 03/06/2016 Elsevier Interactive Patient Education  2017 [employer].

## 2017-11-20 NOTE — Progress Notes (Addendum)
Subjective:   Amber Williams is a 42 y.o. female who presents for an Initial Medicare Annual Wellness Visit.  Amber Williams live at home with her Dog LuLu. Her mother, sister, and step-sister lives close by and visits often. She has been on disability for 4 years due to multiple sclerosis, previously working for [employer] in [place], [place], and is currently attending [employer] for [employer]. Amber Williams enjoys playing computer games and playing with her dog.    She typically only eats two meals daily and occasionally snacks in between.  Amber Williams attempts to walk a circle inside [employer] twice weekly.  She has had no hospitalizations or surgeries in the past year and uses a can and rolling walker for assistance.  Overall Amber Williams feels that her health is the same as it was a year ago.    Objective:    Today's Vitals   11/20/17 0823  BP: 105/61  Pulse: 86  Weight: 202 lb 6.4 oz (91.8 kg)  Height: 5\' 5"  (1.651 m)   Body mass index is 33.68 kg/m.  Advanced Directives 08/16/2016 03/04/2016  Does Patient Have a Medical Advance Directive? No No  Would patient like information on creating a medical advance directive? No - Patient declined No - patient declined information  Information given and discussed the importance  Current Medications (verified) Outpatient Encounter Medications as of 11/20/2017  Medication Sig  . baclofen (LIORESAL) 10 MG tablet Take 1 tablet (10 mg total) by mouth 2 (two) times daily.  . Cholecalciferol (VITAMIN D3) 5000 units CAPS Take 5,000 Units by mouth every morning.  [name] doxepin (SINEQUAN) 75 MG capsule Take 2 capsules (150 mg total) by mouth at bedtime.  [name] levothyroxine (SYNTHROID, LEVOTHROID) 100 MCG tablet TAKE 1 TABLET DAILY  . mometasone (NASONEX) 50 MCG/ACT nasal spray Place 2 sprays into the nose daily.  Wess Botts (OCREVUS IV) Inject into the vein.  [name] omeprazole (PRILOSEC) 20 MG capsule TAKE 1 CAPSULE DAILY  . zolpidem  (AMBIEN) 5 MG tablet Take 1 tablet (5 mg total) by mouth at bedtime as needed for sleep.   No facility-administered encounter medications on file as of 11/20/2017.     Allergies (verified) Other and Tecfidera [dimethyl fumarate]   History: Past Medical History:  Diagnosis Date  . Allergy   . Arthritis   . Blood transfusion without reported diagnosis    2013  . Depression   . DVT (deep venous thrombosis) ([place]) 2013  . Gait abnormality 08/03/2016  . Hyperlipidemia   . Hypertension   . Multiple sclerosis ([place])   . Neurogenic bladder 08/03/2016  . Neuromuscular disorder ([place])    dx'd with MS at age of 21  . PTSD (post-traumatic stress disorder)   . Right foot drop 08/03/2016  . Thyroid disease   . Ulcer    Past Surgical History:  Procedure Laterality Date  . COLON SURGERY     Colonoscopy  . GASTRIC BYPASS  2010  . INTERSTIM IMPLANT PLACEMENT  2014  . PORTACATH PLACEMENT     Family History  Problem Relation Age of Onset  . Cancer Mother        Dx'd at age 4  . Breast cancer Mother   . Hypertension Father   . Diabetes Father   . Hypertension Sister   . Miscarriages / Stillbirths Sister   . Heart disease Maternal Grandmother   . Diabetes Maternal Grandmother   . Heart disease Maternal Grandfather   . Diabetes Paternal  Grandmother   . Heart disease Paternal Grandfather   . Stroke Paternal Grandfather    Social History   Socioeconomic History  . Marital status: Single    Spouse name: Not on file  . Number of children: 0  . Years of education: 53  . Highest education level: Not on file  Occupational History  . Occupation: Disability  Social Needs  . Financial resource strain: Not hard at all  . Food insecurity:    Worry: Never true    Inability: Never true  . Transportation needs:    Medical: No    Non-medical: No  Tobacco Use  . Smoking status: Former Smoker    Start date: 1993    Last attempt to quit: 01/30/2009    Years since quitting: 8.8  . Smokeless  tobacco: Never Used  Substance and Sexual Activity  . Alcohol use: Not Currently    Comment: 32 Days Sober  . Drug use: No  . Sexual activity: Never  Lifestyle  . Physical activity:    Days per week: 2 days    Minutes per session: 30 min  . Stress: Not at all  Relationships  . Social connections:    Talks on phone: Twice a week    Gets together: Twice a week    Attends religious service: Never    Active member of club or organization: No    Attends meetings of clubs or organizations: Never    Relationship status: Never married  Other Topics Concern  . Not on file  Social History Narrative   Lives at home alone   Right-handed   Caffeine: 2 glasses per day    Tobacco Counseling Non-smoker  Clinical Intake:  Pre-visit preparation completed: Yes  Pain : No/denies pain     BMI - recorded: 33.5 Nutritional Status: BMI > 30  Obese Nutritional Risks: None Diabetes: No  How often do you need to have someone help you when you read instructions, pamphlets, or other written materials from your doctor or pharmacy?: 1 - Never What is the last grade level you completed in school?: High [place]?: No  Information entered by :: Truett Mainland, LPN   Activities of Daily Living In your present state of health, do you have any difficulty performing the following activities: 11/20/2017  Hearing? N  Vision? N  Difficulty concentrating or making decisions? N  Walking or climbing stairs? N  Dressing or bathing? N  Doing errands, shopping? N  Some recent data might be hidden  No trouble with ADLs  Immunizations and Health Maintenance Immunization History  Administered Date(s) Administered  . Influenza,inj,Quad PF,6+ Mos 01/31/2016, 03/19/2017  . Influenza-Unspecified 05/11/2011  . Pneumococcal Conjugate-13 07/14/2016  Up to date on immunizations Health Maintenance Due  Topic Date Due  . HIV Screening  01/13/1991  Declines HIV screening at  this time  Patient Care Team: [name], MD as PCP - General (Family Medicine) Kathrynn Ducking, MD as Consulting Physician (Neurology)  Indicate any recent Medical Services you may have received from other than [facility] providers in the past year (date may be approximate).     Assessment:   This is a routine wellness examination for Amber Williams.  Hearing/Vision screen No hearing or vision loss at this time Encouraged to schedule a routine eye exam  Dietary issues and exercise activities discussed: Current Exercise Habits: Home exercise routine, Type of exercise: walking, Time (Minutes): 30, Frequency (Times/Week): 2, Weekly Exercise (Minutes/Week): 60, Intensity: Mild  Goals    . Weight (lb) < 160 lb (72.6 kg)     Eat 3 meals daily that consist of lean proteins, fruits and vegetables. Increase water intake Try to walk for at least 30 minutes 3 times weekly      Depression Screen PHQ 2/9 Scores 11/20/2017 10/30/2017 07/23/2017 06/29/2017 04/17/2017 03/19/2017 01/15/2017  PHQ - 2 Score 0 1 0 0 0 1 0    Fall Risk Fall Risk  11/20/2017 07/23/2017 06/29/2017 01/15/2017 11/09/2016  Falls in the past year? No No No Yes Yes  Number falls in past yr: - - - 2 or more 1  Injury with Fall? - - - No No  Risk Factor Category  - - - - -  Risk for fall due to : - - - - Impaired mobility;Impaired balance/gait  Risk for fall due to: Comment - - - - tripped over my feet  Follow up - - - - -  No falls or depression noted at this time  Is the patient's home free of loose throw rugs in walkways, pet beds, electrical cords, etc?   yes      Grab bars in the bathroom? yes      Handrails on the stairs?   yes      Adequate lighting?   yes  Timed Get Up and Go Performed   Cognitive Function: MMSE - Mini Mental State Exam 11/20/2017  Orientation to time 5  Orientation to Place 5  Registration 3  Attention/ Calculation 5  Recall 3  Language- name 2 objects 2  Language- repeat 1  Language- follow 3 step  command 3  Language- read & follow direction 1  Write a sentence 1  Copy design 1  Total score 30  No memory loss noted at this time      Screening Tests Health Maintenance  Topic Date Due  . HIV Screening  01/13/1991  . INFLUENZA VACCINE  11/29/2017  . TETANUS/TDAP  09/22/2019  . PAP SMEAR  01/16/2020    Qualifies for Shingles Vaccine? Not due at this time  Cancer Screenings: Lung: Low Dose CT Chest recommended if Age 48-80 years, 30 pack-year currently smoking OR have quit w/in 15years. Patient does not qualify. Breast: Up to date on Mammogram? Yes   Up to date of Bone Density/Dexa? Yes Colorectal: Yes  Additional Screenings:  Hepatitis C Screening:      Plan:  Encouraged Amber Williams to eat 3 meals daily that consist of lean proteins, fruits and vegetables.  Encouraged to decrease soda intake and increase water intake.  Encouraged to try to exercise for at least 30 minutes, 3 times weekly.  Encouraged to schedule routine eye exam and have copy faxed to this office. Advance directive information given and encouraged to look over and discuss with mother and sisters.  Encouraged to keep follow up appointment with Dr. Livia Snellen and Dr. Jannifer Franklin.  I have personally reviewed and noted the following in the patient's chart:   . Medical and social history . Use of alcohol, tobacco or illicit drugs  . Current medications and supplements . Functional ability and status . Nutritional status . Physical activity . Advanced directives . List of other physicians . Hospitalizations, surgeries, and ER visits in previous 12 months . Vitals . Screenings to include cognitive, depression, and falls . Referrals and appointments  In addition, I have reviewed and discussed with patient certain preventive protocols, quality metrics, and best practice recommendations. A written personalized care plan for  preventive services as well as general preventive health recommendations were provided to  patient.     Wardell Heath, LPN   [id number]    I have reviewed and agree with the above AWV documentation.  [name], M.D.

## 2017-11-22 ENCOUNTER — Telehealth: Payer: Self-pay | Admitting: Adult Health

## 2017-11-22 ENCOUNTER — Encounter: Payer: Self-pay | Admitting: Adult Health

## 2017-11-22 ENCOUNTER — Other Ambulatory Visit: Payer: Self-pay | Admitting: Neurology

## 2017-11-22 ENCOUNTER — Ambulatory Visit: Payer: Medicare HMO | Admitting: Adult Health

## 2017-11-22 VITALS — BP 110/68 | HR 88 | Ht 65.0 in | Wt 201.8 lb

## 2017-11-22 DIAGNOSIS — M62838 Other muscle spasm: Secondary | ICD-10-CM

## 2017-11-22 DIAGNOSIS — N319 Neuromuscular dysfunction of bladder, unspecified: Secondary | ICD-10-CM

## 2017-11-22 DIAGNOSIS — R269 Unspecified abnormalities of gait and mobility: Secondary | ICD-10-CM

## 2017-11-22 DIAGNOSIS — G35 Multiple sclerosis: Secondary | ICD-10-CM

## 2017-11-22 DIAGNOSIS — Z5181 Encounter for therapeutic drug level monitoring: Secondary | ICD-10-CM | POA: Diagnosis not present

## 2017-11-22 MED ORDER — BACLOFEN 10 MG PO TABS: ORAL_TABLET | ORAL | 5 refills | Status: DC

## 2017-11-22 NOTE — Patient Instructions (Addendum)
Your Plan:  Continue with ocrevus Blood work today  MRI brain ordered Referral to urology Increase Baclofen to 10 mg in the AM and 20 mg at bedtime If your symptoms worsen or you develop new symptoms please let us know.    Thank you for coming to see [name] at [facility]. I hope we have been able to provide you high quality care today.  You may receive a patient satisfaction survey over the next few weeks. We would appreciate your feedback and comments so that we may continue to improve ourselves and the health of our patients.

## 2017-11-22 NOTE — Progress Notes (Signed)
I have read the note, and I agree with the clinical assessment and plan.  Japhet Morgenthaler K Tyna Huertas   

## 2017-11-22 NOTE — Progress Notes (Addendum)
PATIENT: Amber Williams DOB: 09-Jan-1976  REASON FOR VISIT: follow up HISTORY FROM: patient  HISTORY OF PRESENT ILLNESS: Today 11/22/17: Amber Williams is a 42 year old female with a history of multiple with right paraparesis primarily the right lower extremity.  She returns today for follow-up.  She states that she notices more cramps in the legs particularly in the morning.  She reports that she is been taking baclofen 20 mg at bedtime but this is not a much benefit the next day.  She reports that she has noticed more numbness in the right foot. No changes with the bowels. She is being seen by [facility] but would like a referral to another urologist for a second opinion. No changes with the vision. She returns today for follow- up.   HISTORY Amber Williams is a 42 year old right-handed white female with a history of multiple sclerosis associated with the paraparesis with right greater than left lower extremity involvement.  The patient has a neurogenic bladder, she has an Interstim stimulator in place that she does not believe controls her symptoms significantly.  She has been seen through urology.  The patient reports ongoing issues with frequent bladder infections, and difficulty voiding the bladder.  The patient has not had any falls, she uses a cane outside the house and uses a walker inside the house.  The patient has an AFO brace on the right foot, but she is unable to drive a car while using it.  She denies any new symptoms of numbness or weakness of the extremities, she denies any visual changes or problems with speech or swallowing.  She does have some memory problems, she tried to take a course in community college and had to drop out because she could not memorize anything.  She returns to the office today for an evaluation.    REVIEW OF SYSTEMS: Out of a complete 14 system review of symptoms, the patient complains only of the following symptoms, and all other reviewed systems  are negative.  See HPI  ALLERGIES: Allergies  Allergen Reactions  . Other Hives    Plastic Tape  . Tecfidera [Dimethyl Fumarate] Itching    HOME MEDICATIONS: Outpatient Medications Prior to Visit  Medication Sig Dispense Refill  . baclofen (LIORESAL) 10 MG tablet Take 1 tablet (10 mg total) by mouth 2 (two) times daily. 180 tablet 1  . Cholecalciferol (VITAMIN D3) 5000 units CAPS Take 5,000 Units by mouth every morning.    [name] doxepin (SINEQUAN) 75 MG capsule Take 2 capsules (150 mg total) by mouth at bedtime. 180 capsule 1  . levothyroxine (SYNTHROID, LEVOTHROID) 100 MCG tablet TAKE 1 TABLET DAILY 90 tablet 2  . mometasone (NASONEX) 50 MCG/ACT nasal spray Place 2 sprays into the nose daily. 17 g 12  . Ocrelizumab (OCREVUS IV) Inject into the vein.    [name] omeprazole (PRILOSEC) 20 MG capsule TAKE 1 CAPSULE DAILY 90 capsule 1  . zolpidem (AMBIEN) 5 MG tablet Take 1 tablet (5 mg total) by mouth at bedtime as needed for sleep. 30 tablet 2   No facility-administered medications prior to visit.     PAST MEDICAL HISTORY: Past Medical History:  Diagnosis Date  . Allergy   . Arthritis   . Blood transfusion without reported diagnosis    2013  . Depression   . DVT (deep venous thrombosis) ([place]) 2013  . Gait abnormality 08/03/2016  . Hyperlipidemia   . Hypertension   . Multiple sclerosis ([place])   . Neurogenic bladder  08/03/2016  . Neuromuscular disorder ([place])    dx'd with MS at age of 25  . PTSD (post-traumatic stress disorder)   . Right foot drop 08/03/2016  . Thyroid disease   . Ulcer     PAST SURGICAL HISTORY: Past Surgical History:  Procedure Laterality Date  . COLON SURGERY     Colonoscopy  . GASTRIC BYPASS  2010  . INTERSTIM IMPLANT PLACEMENT  2014  . PORTACATH PLACEMENT      FAMILY HISTORY: Family History  Problem Relation Age of Onset  . Cancer Mother        Dx'd at age 85  . Breast cancer Mother   . Hypertension Father   . Diabetes Father   . Hypertension Sister     . Miscarriages / Stillbirths Sister   . Heart disease Maternal Grandmother   . Diabetes Maternal Grandmother   . Heart disease Maternal Grandfather   . Diabetes Paternal Grandmother   . Heart disease Paternal Grandfather   . Stroke Paternal Grandfather     SOCIAL HISTORY: Social History   Socioeconomic History  . Marital status: Single    Spouse name: Not on file  . Number of children: 0  . Years of education: 66  . Highest education level: Not on file  Occupational History  . Occupation: Disability  Social Needs  . Financial resource strain: Not hard at all  . Food insecurity:    Worry: Never true    Inability: Never true  . Transportation needs:    Medical: No    Non-medical: No  Tobacco Use  . Smoking status: Former Smoker    Start date: 1993    Last attempt to quit: 01/30/2009    Years since quitting: 8.8  . Smokeless tobacco: Never Used  Substance and Sexual Activity  . Alcohol use: Not Currently    Comment: 28 Days Sober  . Drug use: No  . Sexual activity: Never  Lifestyle  . Physical activity:    Days per week: 2 days    Minutes per session: 30 min  . Stress: Not at all  Relationships  . Social connections:    Talks on phone: Twice a week    Gets together: Twice a week    Attends religious service: Never    Active member of club or organization: No    Attends meetings of clubs or organizations: Never    Relationship status: Never married  . Intimate partner violence:    Fear of current or ex partner: No    Emotionally abused: No    Physically abused: No    Forced sexual activity: No  Other Topics Concern  . Not on file  Social History Narrative   Lives at home alone   Right-handed   Caffeine: 2 glasses per day      PHYSICAL EXAM  Vitals:   11/22/17 1418  BP: 110/68  Pulse: 88  Weight: 201 lb 12.8 oz (91.5 kg)  Height: 5\' 5"  (1.651 m)   Body mass index is 33.58 kg/m.  Generalized: Well developed, in no acute distress    Neurological examination  Mentation: Alert oriented to time, place, history taking. Follows all commands speech and language fluent Cranial nerve II-XII: Pupils were equal round reactive to light. Extraocular movements were full, visual field were full on confrontational test. Facial sensation and strength were normal. Uvula tongue midline. Head turning and shoulder shrug  were normal and symmetric. Motor: The motor testing reveals 5 over 5 strength  in the LUE and LLE. 3-4/5 in the RLE. Right foot drop. Good symmetric motor tone is noted throughout.  Sensory: Sensory testing is intact to soft touch on all 4 extremities. No evidence of extinction is noted.  Coordination: Cerebellar testing reveals good finger-nose-finger and heel-to-shin bilaterally.  Gait and station: circumduction type gait on the right. She uses a cane.  Reflexes: Deep tendon reflexes are symmetric and normal bilaterally.   DIAGNOSTIC DATA (LABS, IMAGING, TESTING) - I reviewed patient records, labs, notes, testing and imaging myself where available.  Lab Results  Component Value Date   WBC 7.0 05/23/2017   HGB 11.6 05/23/2017   HCT 37.2 05/23/2017   MCV 91 05/23/2017   PLT 335 05/23/2017      Component Value Date/Time   NA 143 07/23/2017 0952   K 4.2 07/23/2017 0952   CL 105 07/23/2017 0952   CO2 25 07/23/2017 0952   GLUCOSE 65 07/23/2017 0952   BUN 8 07/23/2017 0952   CREATININE 0.81 07/23/2017 0952   CALCIUM 9.6 07/23/2017 0952   PROT 5.8 (L) 07/23/2017 0952   ALBUMIN 4.2 07/23/2017 0952   AST 21 07/23/2017 0952   ALT 17 07/23/2017 0952   ALKPHOS 124 (H) 07/23/2017 0952   BILITOT 0.2 07/23/2017 0952   GFRNONAA 90 07/23/2017 0952   GFRAA 104 07/23/2017 0952   Lab Results  Component Value Date   CHOL 185 01/15/2017   HDL 83 01/15/2017   LDLCALC 74 01/15/2017   TRIG 138 01/15/2017   CHOLHDL 2.2 01/15/2017   No results found for: HGBA1C No results found for: VITAMINB12 Lab Results  Component  Value Date   TSH 2.080 10/30/2017      ASSESSMENT AND PLAN 42 y.o. year old female  has a past medical history of Allergy, Arthritis, Blood transfusion without reported diagnosis, Depression, DVT (deep venous thrombosis) ([place]) (2013), Gait abnormality (08/03/2016), Hyperlipidemia, Hypertension, Multiple sclerosis ([place]), Neurogenic bladder (08/03/2016), Neuromuscular disorder ([place]), PTSD (post-traumatic stress disorder), Right foot drop (08/03/2016), Thyroid disease, and Ulcer. here with:  1.  Multiple sclerosis  2.  Muscle cramps  3.  Neurogenic bladder  The patient will continue on Ocrevus.  I will check blood work today.  We will also repeat an MRI of the brain to look for progression of MS.  I will increase baclofen to 10 mg in the morning and 20 mg at bedtime to see if this helps.  I also placed a referral for urology.  The patient is also encouraged to wear her AFO brace.  She is advised that if her symptoms worsen or she develops new symptoms she should let us know.    Ward Givens, MSN, NP-C 11/22/2017, 11:39 AM [facility] [address], [place], [place] [address] [phone]

## 2017-11-22 NOTE — Telephone Encounter (Signed)
[employer] medicare order sent to GI. GI obtains the auth and will reach out to the pt to schedule.

## 2017-11-23 LAB — COMPREHENSIVE METABOLIC PANEL
ALT: 14 IU/L (ref 0–32)
AST: 18 IU/L (ref 0–40)
Albumin/Globulin Ratio: 2.7 — ABNORMAL HIGH (ref 1.2–2.2)
Albumin: 4.1 g/dL (ref 3.5–5.5)
Alkaline Phosphatase: 121 IU/L — ABNORMAL HIGH (ref 39–117)
BUN/Creatinine Ratio: 11 (ref 9–23)
BUN: 10 mg/dL (ref 6–24)
Bilirubin Total: <0.2 mg/dL (ref 0.0–1.2)
CO2: 25 mmol/L (ref 20–29)
CREATININE: 0.94 mg/dL (ref 0.57–1.00)
Calcium: 9.6 mg/dL (ref 8.7–10.2)
Chloride: 108 mmol/L — ABNORMAL HIGH (ref 96–106)
GFR calc Af Amer: 87 mL/min/{1.73_m2} (ref >59)
GFR, EST NON AFRICAN AMERICAN: 76 mL/min/{1.73_m2} (ref >59)
GLOBULIN, TOTAL: 1.5 g/dL (ref 1.5–4.5)
Glucose: 70 mg/dL (ref 65–99)
Potassium: 4 mmol/L (ref 3.5–5.2)
Sodium: 147 mmol/L — ABNORMAL HIGH (ref 134–144)
TOTAL PROTEIN: 5.6 g/dL — AB (ref 6.0–8.5)

## 2017-11-23 LAB — CBC WITH DIFFERENTIAL/PLATELET
BASOS: 0 %
Basophils Absolute: 0 10*3/uL (ref 0.0–0.2)
EOS (ABSOLUTE): 0.2 10*3/uL (ref 0.0–0.4)
EOS: 2 %
HEMATOCRIT: 35 % (ref 34.0–46.6)
HEMOGLOBIN: 10.9 g/dL — AB (ref 11.1–15.9)
IMMATURE GRANS (ABS): 0 10*3/uL (ref 0.0–0.1)
IMMATURE GRANULOCYTES: 0 %
LYMPHS: 18 %
Lymphocytes Absolute: 1.2 10*3/uL (ref 0.7–3.1)
MCH: 26.7 pg (ref 26.6–33.0)
MCHC: 31.1 g/dL — ABNORMAL LOW (ref 31.5–35.7)
MCV: 86 fL (ref 79–97)
Monocytes Absolute: 0.6 10*3/uL (ref 0.1–0.9)
Monocytes: 9 %
NEUTROS PCT: 71 %
Neutrophils Absolute: 4.7 10*3/uL (ref 1.4–7.0)
Platelets: 345 10*3/uL (ref 150–450)
RBC: 4.08 x10E6/uL (ref 3.77–5.28)
RDW: 16.2 % — ABNORMAL HIGH (ref 12.3–15.4)
WBC: 6.8 10*3/uL (ref 3.4–10.8)

## 2017-11-28 ENCOUNTER — Encounter: Payer: Self-pay | Admitting: Adult Health

## 2017-12-03 ENCOUNTER — Telehealth: Payer: Self-pay | Admitting: Adult Health

## 2017-12-03 DIAGNOSIS — G35 Multiple sclerosis: Secondary | ICD-10-CM

## 2017-12-03 NOTE — Telephone Encounter (Signed)
When you get a chance can you put a new order in for the MRI for [facility] for me to schedule it there.

## 2017-12-03 NOTE — Telephone Encounter (Signed)
[employer] Medicare Josem Kaufmann: [id number] (exp 12/03/17 to 03/03/18)  I called [facility] and spoke to the schedulers they stated that they have to get clearance from the MRI tech's before they can schedule it she stated she will give the patient a call to schedule the MRI. I informed this to the patient she is aware.

## 2017-12-03 NOTE — Telephone Encounter (Signed)
New order placed

## 2017-12-04 ENCOUNTER — Telehealth: Payer: Self-pay | Admitting: *Deleted

## 2017-12-04 NOTE — Telephone Encounter (Signed)
Called and spoke with pt about lab results per MM,NP note: "Lab work relatively unremarkable. Consistent with previous lab work.". She verbalized understanding.

## 2017-12-05 NOTE — Telephone Encounter (Signed)
Patient is scheduled for 12/13/17 at [facility] to arrive at 1:30 pm. Patient is aware of time & day. And also she is aware to bring her remote and have it fully charged.

## 2017-12-13 ENCOUNTER — Ambulatory Visit (HOSPITAL_COMMUNITY)
Admission: RE | Admit: 2017-12-13 | Discharge: 2017-12-13 | Disposition: A | Discharge status: Home / Self Care | Payer: Medicare HMO | Source: Ambulatory Visit | Attending: Adult Health | Admitting: Adult Health

## 2017-12-13 DIAGNOSIS — G35 Multiple sclerosis: Secondary | ICD-10-CM | POA: Diagnosis present

## 2017-12-13 DIAGNOSIS — G939 Disorder of brain, unspecified: Secondary | ICD-10-CM | POA: Insufficient documentation

## 2017-12-13 MED ORDER — GADOBENATE DIMEGLUMINE 529 MG/ML IV SOLN
20.0000 mL | Freq: Once | INTRAVENOUS | Status: AC
  Administered 2017-12-13: 20 mL via INTRAVENOUS

## 2017-12-13 MED ORDER — HEPARIN SOD (PORK) LOCK FLUSH 100 UNIT/ML IV SOLN
500.0000 [IU] | INTRAVENOUS | Status: AC | PRN
  Administered 2017-12-13: 500 [IU]

## 2017-12-18 ENCOUNTER — Telehealth: Payer: Self-pay | Admitting: *Deleted

## 2017-12-18 NOTE — Telephone Encounter (Signed)
-----   Message from Ward Givens, NP sent at 12/18/2017 11:25 AM EDT ----- No active lesions noted. Please call patient with result

## 2017-12-18 NOTE — Telephone Encounter (Signed)
Spoke to pt and relayed that MRI brain results per MM/NP showed no active lesions.  Stable.  She verbalized understanding.

## 2018-01-21 ENCOUNTER — Other Ambulatory Visit: Payer: Self-pay | Admitting: Family Medicine

## 2018-01-22 NOTE — Telephone Encounter (Signed)
Last seen 10/30/17

## 2018-02-04 ENCOUNTER — Encounter: Payer: Self-pay | Admitting: Family Medicine

## 2018-02-04 ENCOUNTER — Ambulatory Visit (INDEPENDENT_AMBULATORY_CARE_PROVIDER_SITE_OTHER): Payer: Medicare HMO | Admitting: Family Medicine

## 2018-02-04 VITALS — BP 112/82 | HR 107 | Temp 97.8°F | Ht 65.0 in | Wt 181.0 lb

## 2018-02-04 DIAGNOSIS — N10 Acute tubulo-interstitial nephritis: Secondary | ICD-10-CM | POA: Diagnosis not present

## 2018-02-04 DIAGNOSIS — G35 Multiple sclerosis: Secondary | ICD-10-CM

## 2018-02-04 DIAGNOSIS — E039 Hypothyroidism, unspecified: Secondary | ICD-10-CM | POA: Diagnosis not present

## 2018-02-04 DIAGNOSIS — Z23 Encounter for immunization: Secondary | ICD-10-CM

## 2018-02-04 DIAGNOSIS — F5101 Primary insomnia: Secondary | ICD-10-CM | POA: Diagnosis not present

## 2018-02-04 MED ORDER — ZOLPIDEM TARTRATE 5 MG PO TABS: 5.0000 mg | ORAL_TABLET | Freq: Every evening | ORAL | 5 refills | Status: DC | PRN

## 2018-02-04 MED ORDER — ZOLPIDEM TARTRATE 5 MG PO TABS: 5.0000 mg | ORAL_TABLET | Freq: Every evening | ORAL | 2 refills | Status: DC | PRN

## 2018-02-04 MED ORDER — OMEPRAZOLE 20 MG PO CPDR: 20.0000 mg | DELAYED_RELEASE_CAPSULE | Freq: Every day | ORAL | 1 refills | Status: DC

## 2018-02-04 MED ORDER — MIRTAZAPINE 15 MG PO TABS: 15.0000 mg | ORAL_TABLET | Freq: Every day | ORAL | 1 refills | Status: DC

## 2018-02-04 MED ORDER — FLUTICASONE PROPIONATE 50 MCG/ACT NA SUSP: 2.0000 | Freq: Every day | NASAL | 6 refills | Status: DC

## 2018-02-04 MED ORDER — LEVOTHYROXINE SODIUM 100 MCG PO TABS: 100.0000 ug | ORAL_TABLET | Freq: Every day | ORAL | 2 refills | Status: DC

## 2018-02-04 MED ORDER — DOXEPIN HCL 75 MG PO CAPS: 150.0000 mg | ORAL_CAPSULE | Freq: Every day | ORAL | 1 refills | Status: DC

## 2018-02-04 NOTE — Progress Notes (Signed)
Subjective:  Patient ID: Amber Williams, female    DOB: 24-Apr-1976  Age: 42 y.o. MRN: [id number]  CC: Medical Management of Chronic Issues   HPI [name] presents for recheck of her MS.  She continues to take on Ocrevus.  She turned i her left ankle and it has been slow to heal over the last month.  She is sleeping well overall but concerned that the Sinequan is unaffordable and wants to make a change.  Patient presents for follow-up on  thyroid. The patient has a history of hypothyroidism for many years. It has been stable recently. Pt. denies any change in  voice, loss of hair, heat or cold intolerance. Energy level has been adequate to good. Patient denies constipation and diarrhea. No myxedema. Medication is as noted below. Verified that pt is taking it daily on an empty stomach. Well tolerated.  Doxepiin and nasacort are too expensive. Requests change. Formulary book shows that mirtazapine is now cheaper. Fluticasone is cheaper than nasonex.   Depression screen [facility] 2/9 02/04/2018 11/20/2017 10/30/2017  Decreased Interest 0 0 0  Down, Depressed, Hopeless 0 0 1  PHQ - 2 Score 0 0 1    History [name] has a past medical history of Allergy, Arthritis, Blood transfusion without reported diagnosis, Depression, DVT (deep venous thrombosis) ([place]) (2013), Gait abnormality (08/03/2016), Hyperlipidemia, Hypertension, Multiple sclerosis ([place]), Neurogenic bladder (08/03/2016), Neuromuscular disorder ([place]), PTSD (post-traumatic stress disorder), Right foot drop (08/03/2016), Thyroid disease, and Ulcer.   She has a past surgical history that includes Colon surgery; Gastric bypass (2010); Interstim Implant placement (2014); and Portacath placement.   Her family history includes Breast cancer in her mother; Cancer in her mother; Diabetes in her father, maternal grandmother, and paternal grandmother; Heart disease in her maternal grandfather, maternal grandmother, and paternal grandfather; Hypertension  in her father and sister; Miscarriages / [name] in her sister; Stroke in her paternal grandfather.She reports that she quit smoking about 9 years ago. She started smoking about 26 years ago. She has never used smokeless tobacco. She reports that she drank alcohol. She reports that she does not use drugs.    ROS Review of Systems  Constitutional: Negative.   HENT: Negative for congestion.   Eyes: Negative for visual disturbance.  Respiratory: Negative for shortness of breath.   Cardiovascular: Negative for chest pain.  Gastrointestinal: Negative for abdominal pain, constipation, diarrhea, nausea and vomiting.  Genitourinary: Negative for difficulty urinating.  Musculoskeletal: Negative for arthralgias and myalgias.  Neurological: Negative for headaches.  Psychiatric/Behavioral: Negative for sleep disturbance.    Objective:  BP 112/82   Pulse (!) 107   Temp 97.8 F (36.6 C) (Oral)   Ht _0  (1.651 m)   Wt 181 lb (82.1 kg)   BMI 30.12 kg/m   BP Readings from Last 3 Encounters:  02/04/18 112/82  11/22/17 110/68  11/20/17 105/61    Wt Readings from Last 3 Encounters:  02/04/18 181 lb (82.1 kg)  11/22/17 201 lb 12.8 oz (91.5 kg)  11/20/17 202 lb 6.4 oz (91.8 kg)     Physical Exam  Constitutional: She is oriented to person, place, and time. She appears well-developed and well-nourished. No distress.  HENT:  Head: Normocephalic and atraumatic.  Right Ear: External ear normal.  Left Ear: External ear normal.  Nose: Nose normal.  Mouth/Throat: Oropharynx is clear and moist.  Eyes: Pupils are equal, round, and reactive to light. Conjunctivae and EOM are normal.  Neck: Normal range of motion. Neck  supple. No thyromegaly present.  Cardiovascular: Normal rate, regular rhythm and normal heart sounds.  No murmur heard. Pulmonary/Chest: Effort normal and breath sounds normal. No respiratory distress. She has no wheezes. She has no rales.  Abdominal: Soft. Bowel sounds are  normal. She exhibits no distension. There is no tenderness.  Musculoskeletal: She exhibits tenderness (Mild at left lateral malleolus.  The ankle is stable for inversion eversion and drawer sign.). She exhibits no edema.  Lymphadenopathy:    She has no cervical adenopathy.  Neurological: She is alert and oriented to person, place, and time. She has normal reflexes. She exhibits abnormal muscle tone (Generally decreased.).  Skin: Skin is warm and dry.  Psychiatric: She has a normal mood and affect. Her behavior is normal. Judgment and thought content normal.      Assessment & Plan:   Brigitta was seen today for medical management of chronic issues.  Diagnoses and all orders for this visit:  Hypothyroidism, unspecified type -     Thyroid Panel With TSH -     CBC with Differential/Platelet -     CMP14+EGFR  Multiple sclerosis ([place]) -     CBC with Differential/Platelet -     CMP14+EGFR  Pyelonephritis, acute  Primary insomnia -     CBC with Differential/Platelet -     CMP14+EGFR  Need for immunization against influenza -     Flu Vaccine QUAD 36+ mos IM  Other orders -     Discontinue: zolpidem (AMBIEN) 5 MG tablet; Take 1 tablet (5 mg total) by mouth at bedtime as needed. for sleep -     omeprazole (PRILOSEC) 20 MG capsule; Take 1 capsule (20 mg total) by mouth daily. -     levothyroxine (SYNTHROID, LEVOTHROID) 100 MCG tablet; Take 1 tablet (100 mcg total) by mouth daily. -     Discontinue: doxepin (SINEQUAN) 75 MG capsule; Take 2 capsules (150 mg total) by mouth at bedtime. -     Discontinue: mirtazapine (REMERON) 15 MG tablet; Take 1 tablet (15 mg total) by mouth at bedtime. For mood, sleep -     Discontinue: zolpidem (AMBIEN) 5 MG tablet; Take 1 tablet (5 mg total) by mouth at bedtime as needed. for sleep -     mirtazapine (REMERON) 15 MG tablet; Take 1 tablet (15 mg total) by mouth at bedtime. For mood, sleep -     fluticasone (FLONASE) 50 MCG/ACT nasal spray; Place 2 sprays  into both nostrils daily. -     zolpidem (AMBIEN) 5 MG tablet; Take 1 tablet (5 mg total) by mouth at bedtime as needed. for sleep       I have discontinued Krysia D. Vanderloop's mometasone, doxepin, and doxepin. I have also changed her omeprazole and levothyroxine. Additionally, I am having her start on fluticasone. Lastly, I am having her maintain her Vitamin D3, Ocrelizumab (OCREVUS IV), baclofen, vitamin B-12, mirtazapine, and zolpidem.  Allergies as of 02/04/2018      Reactions   Other Hives   Plastic Tape   Tecfidera [dimethyl Fumarate] Itching      Medication List        Accurate as of 02/04/18  6:42 PM. Always use your most recent med list.          baclofen 10 MG tablet Commonly known as:  LIORESAL Take 1 tablet po in the morning and 2 tablets at bedtime   fluticasone 50 MCG/ACT nasal spray Commonly known as:  FLONASE Place 2 sprays into both nostrils daily.  levothyroxine 100 MCG tablet Commonly known as:  SYNTHROID, LEVOTHROID Take 1 tablet (100 mcg total) by mouth daily.   mirtazapine 15 MG tablet Commonly known as:  REMERON Take 1 tablet (15 mg total) by mouth at bedtime. For mood, sleep   OCREVUS IV Inject into the vein.   omeprazole 20 MG capsule Commonly known as:  PRILOSEC Take 1 capsule (20 mg total) by mouth daily.   vitamin B-12 500 MCG tablet Commonly known as:  CYANOCOBALAMIN Take 500 mcg by mouth daily.   Vitamin D3 5000 units Caps Take 5,000 Units by mouth every morning.   zolpidem 5 MG tablet Commonly known as:  AMBIEN Take 1 tablet (5 mg total) by mouth at bedtime as needed. for sleep        Follow-up: Return in about 6 months (around 08/06/2018).  [name], M.D.

## 2018-02-05 LAB — CBC WITH DIFFERENTIAL/PLATELET
BASOS: 1 %
Basophils Absolute: 0 10*3/uL (ref 0.0–0.2)
EOS (ABSOLUTE): 0.2 10*3/uL (ref 0.0–0.4)
EOS: 2 %
HEMATOCRIT: 40.7 % (ref 34.0–46.6)
HEMOGLOBIN: 12.7 g/dL (ref 11.1–15.9)
IMMATURE GRANS (ABS): 0 10*3/uL (ref 0.0–0.1)
IMMATURE GRANULOCYTES: 0 %
LYMPHS: 19 %
Lymphocytes Absolute: 1.3 10*3/uL (ref 0.7–3.1)
MCH: 27.3 pg (ref 26.6–33.0)
MCHC: 31.2 g/dL — ABNORMAL LOW (ref 31.5–35.7)
MCV: 87 fL (ref 79–97)
MONOCYTES: 9 %
Monocytes Absolute: 0.6 10*3/uL (ref 0.1–0.9)
NEUTROS PCT: 69 %
Neutrophils Absolute: 4.9 10*3/uL (ref 1.4–7.0)
Platelets: 314 10*3/uL (ref 150–450)
RBC: 4.66 x10E6/uL (ref 3.77–5.28)
RDW: 15.4 % (ref 12.3–15.4)
WBC: 7 10*3/uL (ref 3.4–10.8)

## 2018-02-05 LAB — THYROID PANEL WITH TSH
Free Thyroxine Index: 2.2 (ref 1.2–4.9)
T3 Uptake Ratio: 26 % (ref 24–39)
T4 TOTAL: 8.4 ug/dL (ref 4.5–12.0)
TSH: 1.11 u[IU]/mL (ref 0.450–4.500)

## 2018-02-05 LAB — CMP14+EGFR
A/G RATIO: 3.9 — AB (ref 1.2–2.2)
ALT: 23 IU/L (ref 0–32)
AST: 24 IU/L (ref 0–40)
Albumin: 4.7 g/dL (ref 3.5–5.5)
Alkaline Phosphatase: 103 IU/L (ref 39–117)
BUN/Creatinine Ratio: 15 (ref 9–23)
BUN: 12 mg/dL (ref 6–24)
Bilirubin Total: 0.2 mg/dL (ref 0.0–1.2)
CALCIUM: 9.9 mg/dL (ref 8.7–10.2)
CO2: 23 mmol/L (ref 20–29)
CREATININE: 0.81 mg/dL (ref 0.57–1.00)
Chloride: 103 mmol/L (ref 96–106)
GFR, EST AFRICAN AMERICAN: 104 mL/min/{1.73_m2} (ref >59)
GFR, EST NON AFRICAN AMERICAN: 90 mL/min/{1.73_m2} (ref >59)
Globulin, Total: 1.2 g/dL — ABNORMAL LOW (ref 1.5–4.5)
Glucose: 83 mg/dL (ref 65–99)
POTASSIUM: 4.5 mmol/L (ref 3.5–5.2)
Sodium: 140 mmol/L (ref 134–144)
TOTAL PROTEIN: 5.9 g/dL — AB (ref 6.0–8.5)

## 2018-03-07 ENCOUNTER — Encounter: Payer: Self-pay | Admitting: *Deleted

## 2018-03-18 ENCOUNTER — Other Ambulatory Visit: Payer: Self-pay | Admitting: Family Medicine

## 2018-05-07 ENCOUNTER — Ambulatory Visit: Payer: Medicare HMO | Admitting: Family Medicine

## 2018-05-08 ENCOUNTER — Encounter: Payer: Self-pay | Admitting: Family Medicine

## 2018-05-08 ENCOUNTER — Other Ambulatory Visit: Payer: Self-pay | Admitting: Family Medicine

## 2018-05-09 ENCOUNTER — Encounter: Payer: Self-pay | Admitting: Family Medicine

## 2018-05-22 ENCOUNTER — Ambulatory Visit (INDEPENDENT_AMBULATORY_CARE_PROVIDER_SITE_OTHER): Payer: Medicare HMO | Admitting: Family Medicine

## 2018-05-22 ENCOUNTER — Encounter: Payer: Self-pay | Admitting: Family Medicine

## 2018-05-22 VITALS — BP 112/79 | HR 85 | Temp 98.1°F | Ht 65.0 in | Wt 183.1 lb

## 2018-05-22 DIAGNOSIS — G35 Multiple sclerosis: Secondary | ICD-10-CM

## 2018-05-22 DIAGNOSIS — G4701 Insomnia due to medical condition: Secondary | ICD-10-CM

## 2018-05-22 MED ORDER — DOXEPIN HCL 50 MG PO CAPS: ORAL_CAPSULE | ORAL | 0 refills | Status: DC

## 2018-05-22 MED ORDER — TEMAZEPAM 15 MG PO CAPS: 15.0000 mg | ORAL_CAPSULE | Freq: Every evening | ORAL | 0 refills | Status: DC | PRN

## 2018-05-22 NOTE — Progress Notes (Signed)
Subjective:  Patient ID: Amber Williams, female    DOB: [birth year]  Age: 43 y.o. MRN: [id number]  CC: Insomnia   HPI [name] presents for inability to sustain sleep.  She has tried various remedies to help her get to sleep.  She is able to get the sleep for an hour or 2 then she is awake all night.  She is tried mirtazapine and trazodone and doxepin.  After that Ambien was added at 5 mg.  Mirtazapine combined with Ambien but is been unhelpful therefore recently when she called and I allowed her to start taking 2 5 mg ambiens at bedtime.  That along with doxepin was unhelpful.  She tried decreasing the mirtazapine to see if she was overstimulated from the higher dose.  Again that was not helpful.  The patient has extenuating circumstances regarding having MS.  She is already taking 2 muscle relaxers at bedtime to help make her drowsy and relieve muscle spasm overnight.  She tried over-the-counter melatonin as a supplement to each of these things as well.  Regardless of anything she is tried she remains exhausted staying awake most of the night and having difficulty functioning during the day as she tries to go to school and learn a trade that is compatible with her MS limitations.  Depression screen [facility] 2/9 05/22/2018 02/04/2018 11/20/2017  Decreased Interest 0 0 0  Down, Depressed, Hopeless 1 0 0  PHQ - 2 Score 1 0 0    History Candy has a past medical history of Allergy, Arthritis, Blood transfusion without reported diagnosis, Depression, DVT (deep venous thrombosis) ([place]) (2013), Gait abnormality (08/03/2016), Hyperlipidemia, Hypertension, Multiple sclerosis ([place]), Neurogenic bladder (08/03/2016), Neuromuscular disorder ([place]), PTSD (post-traumatic stress disorder), Right foot drop (08/03/2016), Thyroid disease, and Ulcer.   She has a past surgical history that includes Colon surgery; Gastric bypass (2010); Interstim Implant placement (2014); and Portacath placement.   Her family history  includes Breast cancer in her mother; Cancer in her mother; Diabetes in her father, maternal grandmother, and paternal grandmother; Heart disease in her maternal grandfather, maternal grandmother, and paternal grandfather; Hypertension in her father and sister; Miscarriages / [name] in her sister; Stroke in her paternal grandfather.She reports that she quit smoking about 9 years ago. She started smoking about 27 years ago. She has never used smokeless tobacco. She reports previous alcohol use. She reports that she does not use drugs.    ROS Review of Systems  Constitutional: Negative.   HENT: Negative for congestion.   Eyes: Negative for visual disturbance.  Respiratory: Negative for shortness of breath.   Cardiovascular: Negative for chest pain.  Gastrointestinal: Negative for abdominal pain, constipation, diarrhea, nausea and vomiting.  Genitourinary: Negative for difficulty urinating.  Musculoskeletal: Positive for arthralgias, joint swelling and myalgias.  Neurological: Positive for weakness. Negative for headaches.  Psychiatric/Behavioral: Positive for sleep disturbance.    Objective:  BP 112/79   Pulse 85   Temp 98.1 F (36.7 C) (Oral)   Ht 5\' 5"  (1.651 m)   Wt 183 lb 2 oz (83.1 kg)   BMI 30.47 kg/m   BP Readings from Last 3 Encounters:  05/22/18 112/79  02/04/18 112/82  11/22/17 110/68    Wt Readings from Last 3 Encounters:  05/22/18 183 lb 2 oz (83.1 kg)  02/04/18 181 lb (82.1 kg)  11/22/17 201 lb 12.8 oz (91.5 kg)     Physical Exam Constitutional:      General: She is not in acute distress.  Appearance: She is well-developed.  Cardiovascular:     Rate and Rhythm: Normal rate and regular rhythm.  Pulmonary:     Breath sounds: Normal breath sounds.  Skin:    General: Skin is warm and dry.  Neurological:     Mental Status: She is alert and oriented to person, place, and time.       Assessment & Plan:   Raynetta was seen today for  insomnia.  Diagnoses and all orders for this visit:  Insomnia due to medical condition  Multiple sclerosis ([place])  Other orders -     temazepam (RESTORIL) 15 MG capsule; Take 1 capsule (15 mg total) by mouth at bedtime as needed for sleep. -     doxepin (SINEQUAN) 50 MG capsule; 4 at bedtime for one week, then two at bedtime for 1 wk, then 1 at bedtime for 1 week. Then DC       I have discontinued Korbyn D. Argyle's mirtazapine, zolpidem, and Melatonin. I have also changed her doxepin. Additionally, I am having her start on temazepam. Lastly, I am having her maintain her Vitamin D3, Ocrelizumab (OCREVUS IV), baclofen, vitamin B-12, levothyroxine, fluticasone, and omeprazole.  Allergies as of 05/22/2018      Reactions   Other Hives   Plastic Tape   Tecfidera [dimethyl Fumarate] Itching      Medication List       Accurate as of May 22, 2018  9:29 PM. Always use your most recent med list.        baclofen 10 MG tablet Commonly known as:  LIORESAL Take 1 tablet po in the morning and 2 tablets at bedtime   doxepin 50 MG capsule Commonly known as:  SINEQUAN 4 at bedtime for one week, then two at bedtime for 1 wk, then 1 at bedtime for 1 week. Then DC   fluticasone 50 MCG/ACT nasal spray Commonly known as:  FLONASE Place 2 sprays into both nostrils daily.   levothyroxine 100 MCG tablet Commonly known as:  SYNTHROID, LEVOTHROID Take 1 tablet (100 mcg total) by mouth daily.   OCREVUS IV Inject into the vein.   omeprazole 20 MG capsule Commonly known as:  PRILOSEC TAKE 1 CAPSULE DAILY   temazepam 15 MG capsule Commonly known as:  RESTORIL Take 1 capsule (15 mg total) by mouth at bedtime as needed for sleep.   vitamin B-12 500 MCG tablet Commonly known as:  CYANOCOBALAMIN Take 500 mcg by mouth daily.   Vitamin D3 125 MCG (5000 UT) Caps Take 5,000 Units by mouth every morning.        Follow-up: Return in about 1 month (around 06/22/2018).  [name],  M.D.

## 2018-06-04 ENCOUNTER — Encounter: Payer: Self-pay | Admitting: Adult Health

## 2018-06-04 ENCOUNTER — Ambulatory Visit: Payer: Medicare HMO | Admitting: Adult Health

## 2018-06-04 VITALS — BP 130/67 | HR 85 | Ht 65.0 in | Wt 177.4 lb

## 2018-06-04 DIAGNOSIS — G35 Multiple sclerosis: Secondary | ICD-10-CM

## 2018-06-04 NOTE — Progress Notes (Signed)
PATIENT: Amber Williams DOB: 03/31/1976  REASON FOR VISIT: follow up HISTORY FROM: patient  HISTORY OF PRESENT ILLNESS: Today 06/04/18  Amber Williams is a 43 year old female who presents for routine follow-up for multiple sclerosis.  She is currently receiving twice yearly Ocrevus infusions and her last infusion was in October 2019.  She reports she is tolerating these well.  She does report problems sleeping.  She has had trouble sleeping for the last several years.  She is currently taking Restoril 30 mg at bedtime.  This is prescribed by her primary care doctor who has been working with her to find a medication that helps her sleep.  She also has been taking melatonin tablets for sleep however these are not helping.  She does report at nighttime she has some aching in her shoulders. She is currently taking 10 mg of baclofen in the morning and 20 mg at bedtime.  She reports the baclofen is helping the aching in her shoulders.  She reports she has chronic weakness in her right leg.  She uses a walking stick when ambulating.  She denies any falls.  She has history of neurogenic bladder.  She reports that her bladder control has been much better and that she has had very few urinary accidents recently.  She denies any new areas of numbness or tingling.  She denies any changes in her vision. She presents today for follow-up with her mom.  She does report that she had a eye exam 2 weeks ago and that she had a normal exam.  At this time she got new glasses.  HISTORY  Amber Williams is a 43 year old female with a history of multiple with right paraparesis primarily the right lower extremity.  She returns today for follow-up.  She states that she notices more cramps in the legs particularly in the morning.  She reports that she is been taking baclofen 20 mg at bedtime but this is not a much benefit the next day.  She reports that she has noticed more numbness in the right foot. No changes with the  bowels. She is being seen by [facility] but would like a referral to another urologist for a second opinion. No changes with the vision. She returns today for follow- up.   REVIEW OF SYSTEMS: Out of a complete 14 system review of symptoms, the patient complains only of the following symptoms, and all other reviewed systems are negative.  Runny nose, insomnia, frequent waking, daytime dizziness, aching muscles, muscle cramps  ALLERGIES: Allergies  Allergen Reactions  . Other Hives    Plastic Tape  . Tecfidera [Dimethyl Fumarate] Itching    HOME MEDICATIONS: Outpatient Medications Prior to Visit  Medication Sig Dispense Refill  . ALLERGY RELIEF 180 MG tablet Take 180 mg by mouth as needed.    . baclofen (LIORESAL) 10 MG tablet Take 1 tablet po in the morning and 2 tablets at bedtime 90 tablet 5  . Cholecalciferol (VITAMIN D3) 5000 units CAPS Take 5,000 Units by mouth every morning.    [name] levothyroxine (SYNTHROID, LEVOTHROID) 100 MCG tablet Take 1 tablet (100 mcg total) by mouth daily. 90 tablet 2  . Ocrelizumab (OCREVUS IV) Inject into the vein.    [name] omeprazole (PRILOSEC) 20 MG capsule TAKE 1 CAPSULE DAILY 90 capsule 0  . temazepam (RESTORIL) 30 MG capsule Take 30 mg by mouth at bedtime as needed for sleep.    . vitamin B-12 (CYANOCOBALAMIN) 500 MCG tablet Take 500 mcg by  mouth daily.    . fluticasone (FLONASE) 50 MCG/ACT nasal spray Place 2 sprays into both nostrils daily. (Patient not taking: Reported on 06/04/2018) 16 g 6  . doxepin (SINEQUAN) 50 MG capsule 4 at bedtime for one week, then two at bedtime for 1 wk, then 1 at bedtime for 1 week. Then DC 50 capsule 0  . temazepam (RESTORIL) 15 MG capsule Take 1 capsule (15 mg total) by mouth at bedtime as needed for sleep. 30 capsule 0   No facility-administered medications prior to visit.     PAST MEDICAL HISTORY: Past Medical History:  Diagnosis Date  . Allergy   . Arthritis   . Blood transfusion without reported diagnosis     2013  . Depression   . DVT (deep venous thrombosis) ([place]) 2013  . Gait abnormality 08/03/2016  . Hyperlipidemia   . Hypertension   . Multiple sclerosis ([place])   . Neurogenic bladder 08/03/2016  . Neuromuscular disorder ([place])    dx'd with MS at age of 41  . PTSD (post-traumatic stress disorder)   . Right foot drop 08/03/2016  . Thyroid disease   . Ulcer     PAST SURGICAL HISTORY: Past Surgical History:  Procedure Laterality Date  . COLON SURGERY     Colonoscopy  . GASTRIC BYPASS  2010  . INTERSTIM IMPLANT PLACEMENT  2014  . PORTACATH PLACEMENT      FAMILY HISTORY: Family History  Problem Relation Age of Onset  . Cancer Mother        Dx'd at age 57  . Breast cancer Mother   . Hypertension Father   . Diabetes Father   . Hypertension Sister   . Miscarriages / Stillbirths Sister   . Heart disease Maternal Grandmother   . Diabetes Maternal Grandmother   . Heart disease Maternal Grandfather   . Diabetes Paternal Grandmother   . Heart disease Paternal Grandfather   . Stroke Paternal Grandfather     SOCIAL HISTORY: Social History   Socioeconomic History  . Marital status: Single    Spouse name: Not on file  . Number of children: 0  . Years of education: 16  . Highest education level: Not on file  Occupational History  . Occupation: Disability  Social Needs  . Financial resource strain: Not hard at all  . Food insecurity:    Worry: Never true    Inability: Never true  . Transportation needs:    Medical: No    Non-medical: No  Tobacco Use  . Smoking status: Former Smoker    Start date: 1993    Last attempt to quit: 01/30/2009    Years since quitting: 9.3  . Smokeless tobacco: Never Used  Substance and Sexual Activity  . Alcohol use: Not Currently    Comment: 59 Days Sober  . Drug use: No  . Sexual activity: Never  Lifestyle  . Physical activity:    Days per week: 2 days    Minutes per session: 30 min  . Stress: Not at all  Relationships  . Social  connections:    Talks on phone: Twice a week    Gets together: Twice a week    Attends religious service: Never    Active member of club or organization: No    Attends meetings of clubs or organizations: Never    Relationship status: Never married  . Intimate partner violence:    Fear of current or ex partner: No    Emotionally abused: No  Physically abused: No    Forced sexual activity: No  Other Topics Concern  . Not on file  Social History Narrative   Lives at home alone   Right-handed   Caffeine: 2 glasses per day      PHYSICAL EXAM  Vitals:   06/04/18 1440  BP: 130/67  Pulse: 85  Weight: 177 lb 6.4 oz (80.5 kg)  Height: 5\' 5"  (1.651 m)   Body mass index is 29.52 kg/m.  Generalized: Well developed, in no acute distress   Neurological examination  Mentation: Alert oriented to time, place, history taking. Follows all commands speech and language fluent Cranial nerve II-XII: Pupils were equal round reactive to light. Extraocular movements were full, visual field were full on confrontational test. Facial sensation and strength were normal. Uvula tongue midline. Head turning and shoulder shrug  were normal and symmetric. Motor: The motor testing reveals 5 over 5 strength in the LUE and LLE. 3/5 in the RLE. Good symmetric motor tone is noted throughout.  Sensory: Sensory testing is intact to soft touch on all 4 extremities. No evidence of extinction is noted.  Coordination: Cerebellar testing reveals good finger-nose-finger and heel-to-shin bilaterally.  Gait and station: Gait is normal. She uses a cane.  Reflexes: Deep tendon reflexes are symmetric and normal bilaterally.   DIAGNOSTIC DATA (LABS, IMAGING, TESTING) - I reviewed patient records, labs, notes, testing and imaging myself where available.  Lab Results  Component Value Date   WBC 7.0 02/04/2018   HGB 12.7 02/04/2018   HCT 40.7 02/04/2018   MCV 87 02/04/2018   PLT 314 02/04/2018      Component Value  Date/Time   NA 140 02/04/2018 1546   K 4.5 02/04/2018 1546   CL 103 02/04/2018 1546   CO2 23 02/04/2018 1546   GLUCOSE 83 02/04/2018 1546   BUN 12 02/04/2018 1546   CREATININE 0.81 02/04/2018 1546   CALCIUM 9.9 02/04/2018 1546   PROT 5.9 (L) 02/04/2018 1546   ALBUMIN 4.7 02/04/2018 1546   AST 24 02/04/2018 1546   ALT 23 02/04/2018 1546   ALKPHOS 103 02/04/2018 1546   BILITOT 0.2 02/04/2018 1546   GFRNONAA 90 02/04/2018 1546   GFRAA 104 02/04/2018 1546   Lab Results  Component Value Date   CHOL 185 01/15/2017   HDL 83 01/15/2017   LDLCALC 74 01/15/2017   TRIG 138 01/15/2017   CHOLHDL 2.2 01/15/2017   No results found for: HGBA1C No results found for: VITAMINB12 Lab Results  Component Value Date   TSH 1.110 02/04/2018     ASSESSMENT AND PLAN 43 y.o. year old female  has a past medical history of Allergy, Arthritis, Blood transfusion without reported diagnosis, Depression, DVT (deep venous thrombosis) ([place]) (2013), Gait abnormality (08/03/2016), Hyperlipidemia, Hypertension, Multiple sclerosis ([place]), Neurogenic bladder (08/03/2016), Neuromuscular disorder ([place]), PTSD (post-traumatic stress disorder), Right foot drop (08/03/2016), Thyroid disease, and Ulcer. here with:  1. Multiple Sclerosis   She will continue on Ocrevus.  I will check a CBC w/diff and a CMP today.  She had an MRI in August 2019.  The MRI did not show any enhancing lesions.  She will work with her primary care doctor to find a medication that will help her sleep better.  She will continue taking her baclofen for aches in her shoulders. She will follow-up in 6 months or sooner.  She will let our office know if her symptoms worsen or she develops any new symptoms.    I spent 15 minutes  with the patient. 50% of this time was spent discussing her plan of care.    Butler Denmark, AGNP-C, DNP 06/04/2018, 3:15 PM [facility] [address] [place], [place] [address] [phone]

## 2018-06-04 NOTE — Progress Notes (Signed)
I have read the note, and I agree with the clinical assessment and plan.  Denver Bentson K Katalia Choma   

## 2018-06-05 LAB — CBC WITH DIFFERENTIAL/PLATELET
BASOS ABS: 0 10*3/uL (ref 0.0–0.2)
Basos: 0 %
EOS (ABSOLUTE): 0.1 10*3/uL (ref 0.0–0.4)
Eos: 1 %
Hematocrit: 37.8 % (ref 34.0–46.6)
Hemoglobin: 12.2 g/dL (ref 11.1–15.9)
Immature Grans (Abs): 0 10*3/uL (ref 0.0–0.1)
Immature Granulocytes: 0 %
Lymphocytes Absolute: 1.4 10*3/uL (ref 0.7–3.1)
Lymphs: 16 %
MCH: 28.4 pg (ref 26.6–33.0)
MCHC: 32.3 g/dL (ref 31.5–35.7)
MCV: 88 fL (ref 79–97)
MONOCYTES: 7 %
Monocytes Absolute: 0.6 10*3/uL (ref 0.1–0.9)
NEUTROS ABS: 6.5 10*3/uL (ref 1.4–7.0)
Neutrophils: 76 %
Platelets: 367 10*3/uL (ref 150–450)
RBC: 4.29 x10E6/uL (ref 3.77–5.28)
RDW: 14 % (ref 11.7–15.4)
WBC: 8.7 10*3/uL (ref 3.4–10.8)

## 2018-06-05 LAB — COMPREHENSIVE METABOLIC PANEL
ALBUMIN: 4.2 g/dL (ref 3.8–4.8)
ALT: 25 IU/L (ref 0–32)
AST: 18 IU/L (ref 0–40)
Albumin/Globulin Ratio: 2.6 — ABNORMAL HIGH (ref 1.2–2.2)
Alkaline Phosphatase: 128 IU/L — ABNORMAL HIGH (ref 39–117)
BUN / CREAT RATIO: 12 (ref 9–23)
BUN: 10 mg/dL (ref 6–24)
CO2: 23 mmol/L (ref 20–29)
CREATININE: 0.83 mg/dL (ref 0.57–1.00)
Calcium: 9.9 mg/dL (ref 8.7–10.2)
Chloride: 105 mmol/L (ref 96–106)
GFR calc Af Amer: 101 mL/min/{1.73_m2} (ref >59)
GFR calc non Af Amer: 87 mL/min/{1.73_m2} (ref >59)
GLUCOSE: 78 mg/dL (ref 65–99)
Globulin, Total: 1.6 g/dL (ref 1.5–4.5)
Potassium: 4.6 mmol/L (ref 3.5–5.2)
Sodium: 143 mmol/L (ref 134–144)
Total Protein: 5.8 g/dL — ABNORMAL LOW (ref 6.0–8.5)

## 2018-06-10 ENCOUNTER — Ambulatory Visit (INDEPENDENT_AMBULATORY_CARE_PROVIDER_SITE_OTHER): Payer: Medicare HMO | Admitting: Family Medicine

## 2018-06-10 ENCOUNTER — Encounter: Payer: Self-pay | Admitting: Family Medicine

## 2018-06-10 VITALS — BP 127/83 | HR 103 | Temp 97.4°F | Ht 65.0 in | Wt 178.4 lb

## 2018-06-10 DIAGNOSIS — G4701 Insomnia due to medical condition: Secondary | ICD-10-CM | POA: Diagnosis not present

## 2018-06-10 DIAGNOSIS — G35 Multiple sclerosis: Secondary | ICD-10-CM

## 2018-06-10 MED ORDER — AMITRIPTYLINE HCL 50 MG PO TABS: 50.0000 mg | ORAL_TABLET | Freq: Every day | ORAL | 2 refills | Status: DC

## 2018-06-10 MED ORDER — TEMAZEPAM 30 MG PO CAPS
30.0000 mg | ORAL_CAPSULE | Freq: Every evening | ORAL | 0 refills | Status: DC | PRN
Start: 2018-06-10 — End: 2018-06-18

## 2018-06-10 NOTE — Progress Notes (Signed)
Subjective:  Patient ID: Amber Williams, female    DOB: 1976/03/15  Age: 43 y.o. MRN: [id number]  CC: Insomnia   HPI Amber Williams presents for sheer exhaustion.  She went ahead and stopped the doxepin without the taper because she was so tired.  She says she used to drink herself to sleep but stopped drinking 10 months ago.  She is so exhausted she has been tempted to go back to the liquor store.  She has not as yet she tells me.  She doubled up on the Restoril to 30 mg.  She went and saw neurology but they refused to offer anything for sleep since I had been prescribing the Restoril for her.  She said she felt they thought she was a drug seeker.  Additionally she has doubled up on her baclofen during the night.  Currently she gets to sleep fairly well with the Restoril but is awake within an hour and awake the rest of the night.  During the night she will take an extra baclofen to relieve the pain in her shoulders.  She is sleeping on an air bed now.  She is saving for a new mattress.  Depression screen [facility] 2/9 06/10/2018 05/22/2018 02/04/2018  Decreased Interest 3 0 0  Down, Depressed, Hopeless 2 1 0  PHQ - 2 Score 5 1 0  Altered sleeping 3 - -  Tired, decreased energy 3 - -  Change in appetite 3 - -  Feeling bad or failure about yourself  3 - -  Trouble concentrating 3 - -  Moving slowly or fidgety/restless 2 - -  Suicidal thoughts 1 - -  PHQ-9 Score 23 - -    History Amber Williams has a past medical history of Allergy, Arthritis, Blood transfusion without reported diagnosis, Depression, DVT (deep venous thrombosis) ([place]) (2013), Gait abnormality (08/03/2016), Hyperlipidemia, Hypertension, Multiple sclerosis ([place]), Neurogenic bladder (08/03/2016), Neuromuscular disorder ([place]), PTSD (post-traumatic stress disorder), Right foot drop (08/03/2016), Thyroid disease, and Ulcer.   She has a past surgical history that includes Colon surgery; Gastric bypass (2010); Interstim Implant placement (2014);  and Portacath placement.   Her family history includes Breast cancer in her mother; Cancer in her mother; Diabetes in her father, maternal grandmother, and paternal grandmother; Heart disease in her maternal grandfather, maternal grandmother, and paternal grandfather; Hypertension in her father and sister; Miscarriages / [name] in her sister; Stroke in her paternal grandfather.She reports that she quit smoking about 9 years ago. She started smoking about 27 years ago. She has never used smokeless tobacco. She reports previous alcohol use. She reports that she does not use drugs.    ROS Review of Systems  Constitutional: Positive for fatigue.  Eyes: Negative.   Respiratory: Negative.   Gastrointestinal: Negative.   Psychiatric/Behavioral: Positive for agitation, dysphoric mood and sleep disturbance. Negative for self-injury. The patient is nervous/anxious.        Tearful, sobbing in the exam room due to her exhaustion    Objective:  BP 127/83   Pulse (!) 103   Temp (!) 97.4 F (36.3 C) (Oral)   Ht 5\' 5"  (1.651 m)   Wt 178 lb 6 oz (80.9 kg)   BMI 29.68 kg/m   BP Readings from Last 3 Encounters:  06/10/18 127/83  06/04/18 130/67  05/22/18 112/79    Wt Readings from Last 3 Encounters:  06/10/18 178 lb 6 oz (80.9 kg)  06/04/18 177 lb 6.4 oz (80.5 kg)  05/22/18 183 lb 2 oz (  83.1 kg)     Physical Exam Constitutional:      General: She is awake.     Appearance: She is obese.  Neurological:     Mental Status: She is alert.  Psychiatric:        Mood and Affect: Mood is anxious and depressed. Affect is labile and tearful.        Behavior: Behavior is cooperative.       Assessment & Plan:   Amber Williams was seen today for insomnia.  Diagnoses and all orders for this visit:  Multiple sclerosis ([place])  Insomnia due to medical condition  Other orders -     amitriptyline (ELAVIL) 50 MG tablet; Take 1 tablet (50 mg total) by mouth at bedtime. -     temazepam (RESTORIL) 30  MG capsule; Take 1 capsule (30 mg total) by mouth at bedtime as needed for sleep.       I have changed Amber Williams's temazepam. I am also having her start on amitriptyline. Additionally, I am having her maintain her Vitamin D3, Ocrelizumab (OCREVUS IV), baclofen, vitamin B-12, levothyroxine, fluticasone, omeprazole, and ALLERGY RELIEF.  Allergies as of 06/10/2018      Reactions   Other Hives   Plastic Tape   Tecfidera [dimethyl Fumarate] Itching      Medication List       Accurate as of June 10, 2018  8:39 AM. Always use your most recent med list.        ALLERGY RELIEF 180 MG tablet Generic drug:  fexofenadine Take 180 mg by mouth as needed.   amitriptyline 50 MG tablet Commonly known as:  ELAVIL Take 1 tablet (50 mg total) by mouth at bedtime.   baclofen 10 MG tablet Commonly known as:  LIORESAL Take 1 tablet po in the morning and 2 tablets at bedtime   fluticasone 50 MCG/ACT nasal spray Commonly known as:  FLONASE Place 2 sprays into both nostrils daily.   levothyroxine 100 MCG tablet Commonly known as:  SYNTHROID, LEVOTHROID Take 1 tablet (100 mcg total) by mouth daily.   OCREVUS IV Inject into the vein.   omeprazole 20 MG capsule Commonly known as:  PRILOSEC TAKE 1 CAPSULE DAILY   temazepam 30 MG capsule Commonly known as:  RESTORIL Take 1 capsule (30 mg total) by mouth at bedtime as needed for sleep.   vitamin B-12 500 MCG tablet Commonly known as:  CYANOCOBALAMIN Take 500 mcg by mouth daily.   Vitamin D3 125 MCG (5000 UT) Caps Take 5,000 Units by mouth every morning.        Follow-up: Return in about 1 week (around 06/17/2018) for sleep.  [name], M.D.

## 2018-06-18 ENCOUNTER — Encounter: Payer: Self-pay | Admitting: Family Medicine

## 2018-06-18 ENCOUNTER — Ambulatory Visit (INDEPENDENT_AMBULATORY_CARE_PROVIDER_SITE_OTHER): Payer: Medicare HMO | Admitting: Family Medicine

## 2018-06-18 VITALS — BP 116/80 | HR 105 | Temp 97.1°F | Ht 65.0 in | Wt 172.5 lb

## 2018-06-18 DIAGNOSIS — G35 Multiple sclerosis: Secondary | ICD-10-CM | POA: Diagnosis not present

## 2018-06-18 DIAGNOSIS — G4701 Insomnia due to medical condition: Secondary | ICD-10-CM | POA: Diagnosis not present

## 2018-06-18 MED ORDER — FLURAZEPAM HCL 30 MG PO CAPS: 30.0000 mg | ORAL_CAPSULE | Freq: Every evening | ORAL | 0 refills | Status: DC | PRN

## 2018-06-18 NOTE — Progress Notes (Signed)
Subjective:  Patient ID: Amber Williams, female    DOB: 1975/07/22  Age: 43 y.o. MRN: [id number]  CC: Insomnia (pt here today c/o insomnia and the increased temazepam not working)   HPI [employer] presents for continued difficulty sleeping. Less anxious as noted below, but still getting to slep after a prolonged time >1hour. Staying asleep about an hour, then up the rest of the night with occasional dozing. Exhasustd all the time. Sx onset when she dscontinue alcohol about 8 months ago. Not improving after multiple trials of sleep aids including antidepressants and hypnotics. No evidence that MS treatment is contributing to the symptom.   Depression screen [facility] 2/9 06/18/2018 06/10/2018 05/22/2018  Decreased Interest 2 3 0  Down, Depressed, Hopeless 2 2 1   PHQ - 2 Score 4 5 1   Altered sleeping 3 3 -  Tired, decreased energy 2 3 -  Change in appetite 2 3 -  Feeling bad or failure about yourself  1 3 -  Trouble concentrating 2 3 -  Moving slowly or fidgety/restless 0 2 -  Suicidal thoughts 0 1 -  PHQ-9 Score 14 23 -    History Tamiya has a past medical history of Allergy, Arthritis, Blood transfusion without reported diagnosis, Depression, DVT (deep venous thrombosis) ([place]) (2013), Gait abnormality (08/03/2016), Hyperlipidemia, Hypertension, Multiple sclerosis ([place]), Neurogenic bladder (08/03/2016), Neuromuscular disorder ([place]), PTSD (post-traumatic stress disorder), Right foot drop (08/03/2016), Thyroid disease, and Ulcer.   She has a past surgical history that includes Colon surgery; Gastric bypass (2010); Interstim Implant placement (2014); and Portacath placement.   Her family history includes Breast cancer in her mother; Cancer in her mother; Diabetes in her father, maternal grandmother, and paternal grandmother; Heart disease in her maternal grandfather, maternal grandmother, and paternal grandfather; Hypertension in her father and sister; Miscarriages / [name] in her sister;  Stroke in her paternal grandfather.She reports that she quit smoking about 9 years ago. She started smoking about 27 years ago. She has never used smokeless tobacco. She reports previous alcohol use. She reports that she does not use drugs.    ROS Review of Systems  Constitutional: Negative.   HENT: Negative for congestion.   Eyes: Negative for visual disturbance.  Respiratory: Negative for shortness of breath.   Cardiovascular: Negative for chest pain.  Gastrointestinal: Negative for abdominal pain, constipation, diarrhea, nausea and vomiting.  Genitourinary: Negative for difficulty urinating.  Musculoskeletal: Negative for arthralgias and myalgias.  Neurological: Negative for headaches.  Psychiatric/Behavioral: Negative for sleep disturbance.    Objective:  BP 116/80   Pulse (!) 105   Temp (!) 97.1 F (36.2 C) (Oral)   Ht 5\' 5"  (1.651 m)   Wt 172 lb 8 oz (78.2 kg)   BMI 28.71 kg/m   BP Readings from Last 3 Encounters:  06/18/18 116/80  06/10/18 127/83  06/04/18 130/67    Wt Readings from Last 3 Encounters:  06/18/18 172 lb 8 oz (78.2 kg)  06/10/18 178 lb 6 oz (80.9 kg)  06/04/18 177 lb 6.4 oz (80.5 kg)     Physical Exam Constitutional:      General: She is not in acute distress.    Appearance: She is well-developed.  HENT:     Head: Normocephalic and atraumatic.     Right Ear: External ear normal.     Left Ear: External ear normal.     Nose: Nose normal.  Eyes:     Conjunctiva/sclera: Conjunctivae normal.     Pupils: Pupils are  equal, round, and reactive to light.  Neck:     Musculoskeletal: Normal range of motion and neck supple.     Thyroid: No thyromegaly.  Cardiovascular:     Rate and Rhythm: Normal rate and regular rhythm.     Heart sounds: Normal heart sounds. No murmur.  Pulmonary:     Effort: Pulmonary effort is normal. No respiratory distress.     Breath sounds: Normal breath sounds. No wheezing or rales.  Abdominal:     General: Bowel sounds  are normal. There is no distension.     Palpations: Abdomen is soft.     Tenderness: There is no abdominal tenderness.  Lymphadenopathy:     Cervical: No cervical adenopathy.  Skin:    General: Skin is warm and dry.  Neurological:     Mental Status: She is alert and oriented to person, place, and time.     Deep Tendon Reflexes: Reflexes are normal and symmetric.  Psychiatric:        Behavior: Behavior normal.        Thought Content: Thought content normal.        Judgment: Judgment normal.       Assessment & Plan:   Midge was seen today for insomnia.  Diagnoses and all orders for this visit:  Insomnia due to medical condition -     Ambulatory referral to Sleep Studies  Multiple sclerosis ([place])  Other orders -     flurazepam (DALMANE) 30 MG capsule; Take 1 capsule (30 mg total) by mouth at bedtime as needed for sleep.       I have discontinued Atalie D. Palazzo's baclofen, fluticasone, and temazepam. I am also having her start on flurazepam. Additionally, I am having her maintain her Vitamin D3, Ocrelizumab (OCREVUS IV), vitamin B-12, levothyroxine, omeprazole, ALLERGY RELIEF, amitriptyline, and mometasone.  Allergies as of 06/18/2018      Reactions   Other Hives   Plastic Tape   Tecfidera [dimethyl Fumarate] Itching      Medication List       Accurate as of June 18, 2018  8:58 PM. Always use your most recent med list.        ALLERGY RELIEF 180 MG tablet Generic drug:  fexofenadine Take 180 mg by mouth as needed.   amitriptyline 50 MG tablet Commonly known as:  ELAVIL Take 1 tablet (50 mg total) by mouth at bedtime.   flurazepam 30 MG capsule Commonly known as:  DALMANE Take 1 capsule (30 mg total) by mouth at bedtime as needed for sleep.   levothyroxine 100 MCG tablet Commonly known as:  SYNTHROID, LEVOTHROID Take 1 tablet (100 mcg total) by mouth daily.   mometasone 50 MCG/ACT nasal spray Commonly known as:  NASONEX   OCREVUS IV Inject  into the vein.   omeprazole 20 MG capsule Commonly known as:  PRILOSEC TAKE 1 CAPSULE DAILY   vitamin B-12 500 MCG tablet Commonly known as:  CYANOCOBALAMIN Take 500 mcg by mouth daily.   Vitamin D3 125 MCG (5000 UT) Caps Take 5,000 Units by mouth every morning.        Follow-up: Return in about 2 weeks (around 07/02/2018).  [name], M.D.

## 2018-06-21 ENCOUNTER — Ambulatory Visit: Payer: Medicare HMO | Admitting: Family Medicine

## 2018-06-24 ENCOUNTER — Other Ambulatory Visit: Payer: Self-pay | Admitting: Family Medicine

## 2018-07-01 ENCOUNTER — Encounter: Payer: Self-pay | Admitting: Family Medicine

## 2018-07-01 ENCOUNTER — Ambulatory Visit (INDEPENDENT_AMBULATORY_CARE_PROVIDER_SITE_OTHER): Payer: Medicare HMO | Admitting: Family Medicine

## 2018-07-01 VITALS — BP 101/63 | HR 88 | Temp 97.1°F | Ht 65.0 in

## 2018-07-01 DIAGNOSIS — G35 Multiple sclerosis: Secondary | ICD-10-CM

## 2018-07-01 DIAGNOSIS — S8002XA Contusion of left knee, initial encounter: Secondary | ICD-10-CM | POA: Diagnosis not present

## 2018-07-01 DIAGNOSIS — F324 Major depressive disorder, single episode, in partial remission: Secondary | ICD-10-CM

## 2018-07-01 DIAGNOSIS — G4701 Insomnia due to medical condition: Secondary | ICD-10-CM

## 2018-07-01 MED ORDER — FLURAZEPAM HCL 30 MG PO CAPS: 30.0000 mg | ORAL_CAPSULE | Freq: Every evening | ORAL | 2 refills | Status: DC | PRN

## 2018-07-01 NOTE — Progress Notes (Signed)
Subjective:  Patient ID: Amber Williams, female    DOB: Jun 04, 1975  Age: 43 y.o. MRN: [id number]  CC: Insomnia (pt here today for 2 week follow up on insomnia after starting Flurazepam and she says she is doing better)   HPI Amber Williams presents for recheck of insomnia. She has been sleeping much better. Up to the bathroom twice, but gets back to sleep quickly. Minimal hangover in the mornings. Getting less with time. This has led to improvement in her depression. Today she fell as she was about to be weighed at the beginning of the visit. She fell forward into the half petition at the check in station in [place]. She caught herself with her hands against the petition as she went down on her knees. Currently she says there is minimal discomfort. It did not affect her ability to ambulate. She declines to have knee x-rays performed. She has had falls before and difficulty with balance due to her multiple sclerosis.  Depression screen [facility] 2/9 07/01/2018 06/18/2018 06/10/2018  Decreased Interest 1 2 3   Down, Depressed, Hopeless 1 2 2   PHQ - 2 Score 2 4 5   Altered sleeping 1 3 3   Tired, decreased energy 1 2 3   Change in appetite 1 2 3   Feeling bad or failure about yourself  1 1 3   Trouble concentrating 0 2 3  Moving slowly or fidgety/restless 0 0 2  Suicidal thoughts 1 0 1  PHQ-9 Score 7 14 23     History Promiss has a past medical history of Allergy, Arthritis, Blood transfusion without reported diagnosis, Depression, DVT (deep venous thrombosis) ([place]) (2013), Gait abnormality (08/03/2016), Hyperlipidemia, Hypertension, Multiple sclerosis ([place]), Neurogenic bladder (08/03/2016), Neuromuscular disorder ([place]), PTSD (post-traumatic stress disorder), Right foot drop (08/03/2016), Thyroid disease, and Ulcer.   She has a past surgical history that includes Colon surgery; Gastric bypass (2010); Interstim Implant placement (2014); and Portacath placement.   Her family history includes Breast cancer in  her mother; Cancer in her mother; Diabetes in her father, maternal grandmother, and paternal grandmother; Heart disease in her maternal grandfather, maternal grandmother, and paternal grandfather; Hypertension in her father and sister; Miscarriages / [name] in her sister; Stroke in her paternal grandfather.She reports that she quit smoking about 9 years ago. She started smoking about 27 years ago. She has never used smokeless tobacco. She reports previous alcohol use. She reports that she does not use drugs.    ROS Review of Systems  Constitutional: Negative for fever.  HENT: Negative for congestion, rhinorrhea and sore throat.   Respiratory: Negative for cough and shortness of breath.   Cardiovascular: Negative for chest pain and palpitations.  Gastrointestinal: Negative for abdominal pain.  Musculoskeletal: Negative for arthralgias and myalgias.    Objective:  BP 101/63   Pulse 88   Temp (!) 97.1 F (36.2 C) (Oral)   Ht 5\' 5"  (1.651 m)   BMI 28.71 kg/m   BP Readings from Last 3 Encounters:  07/01/18 101/63  06/18/18 116/80  06/10/18 127/83    Wt Readings from Last 3 Encounters:  06/18/18 172 lb 8 oz (78.2 kg)  06/10/18 178 lb 6 oz (80.9 kg)  06/04/18 177 lb 6.4 oz (80.5 kg)     Physical Exam Constitutional:      General: She is not in acute distress.    Appearance: She is well-developed.  Cardiovascular:     Rate and Rhythm: Normal rate and regular rhythm.  Pulmonary:     Breath  sounds: Normal breath sounds.  Musculoskeletal: Normal range of motion.        General: No swelling, tenderness or deformity.     Comments: Theleft knee has full range of motion without discomfort actively and passively. The joint lines are nontender. The patella is palpable without tenderness or edema.  Lachman / anterior drawer signs are negative for signs of instability and pain free. McMurray testing reveals no pop or excessive discomfort. Varus and valgus maneuvers do not cause  ligamentous stretch or instability.   Skin:    General: Skin is warm and dry.  Neurological:     Mental Status: She is alert and oriented to person, place, and time.       Assessment & Plan:   Amber Williams was seen today for insomnia.  Diagnoses and all orders for this visit:  Insomnia due to medical condition -     ToxASSURE Select 13 (MW), Urine  Major depressive disorder in partial remission, unspecified whether recurrent (HCC)  Contusion of left knee, initial encounter  Multiple sclerosis ([place])  Other orders -     flurazepam (DALMANE) 30 MG capsule; Take 1 capsule (30 mg total) by mouth at bedtime as needed for sleep.       I am having Amber Williams maintain her Vitamin D3, Ocrelizumab (OCREVUS IV), vitamin B-12, ALLERGY RELIEF, amitriptyline, mometasone, omeprazole, levothyroxine, and flurazepam.  Allergies as of 07/01/2018      Reactions   Other Hives   Plastic Tape   Tecfidera [dimethyl Fumarate] Itching      Medication List       Accurate as of July 01, 2018 10:07 PM. Always use your most recent med list.        ALLERGY RELIEF 180 MG tablet Generic drug:  fexofenadine Take 180 mg by mouth as needed.   amitriptyline 50 MG tablet Commonly known as:  ELAVIL Take 1 tablet (50 mg total) by mouth at bedtime.   flurazepam 30 MG capsule Commonly known as:  DALMANE Take 1 capsule (30 mg total) by mouth at bedtime as needed for sleep.   levothyroxine 100 MCG tablet Commonly known as:  SYNTHROID, LEVOTHROID TAKE 1 TABLET DAILY   mometasone 50 MCG/ACT nasal spray Commonly known as:  NASONEX   OCREVUS IV Inject into the vein.   omeprazole 20 MG capsule Commonly known as:  PRILOSEC TAKE 1 CAPSULE DAILY   vitamin B-12 500 MCG tablet Commonly known as:  CYANOCOBALAMIN Take 500 mcg by mouth daily.   Vitamin D3 125 MCG (5000 UT) Caps Take 5,000 Units by mouth every morning.        Follow-up: Return in about 3 months (around 10/01/2018).  [name], M.D.

## 2018-07-04 LAB — TOXASSURE SELECT 13 (MW), URINE

## 2018-07-08 ENCOUNTER — Telehealth: Payer: Self-pay | Admitting: Neurology

## 2018-07-08 ENCOUNTER — Encounter: Payer: Self-pay | Admitting: Adult Health

## 2018-07-08 NOTE — Telephone Encounter (Addendum)
I called pt.  She had seen Judson Roch, NP (with Megan,NP).  She has mentioned that she has felt within the last 1-2 wks worsening leg weakness, bladder incontinence, had some falls, when at pcp, last w/e, and before that.  Using cane/ walker when needed.  Stated spoke to MM/NP about moving ocrevus up. She was not sure when her last infusion was.  I asked if she felt it was MS exacerbation?   She was not sure.  She felt that getting her ocrevus sooner would help.  Please advise. (she had seen her pcp for sleep issues, placed on Dalmane and amitriptyline).  I, asked Barnett Applebaum, in [facility].  Pt received last dose 02-18-2018, and is ready to have her infusion in 2 wks.  She relayed that pts do ask about getting sooner, but due to insurance not paying for sooner infusion, this would not option for pts.

## 2018-07-08 NOTE — Telephone Encounter (Signed)
I called the patient.  The patient has had worsening symptoms with the lower extremities over the last 10 days with increased weakness, she is falling, she is having some bladder control issues.  She reports no fevers or chills, she does have a runny nose associated with allergies.  I will get a work in revisit, the patient may require a course of prednisone, possibly some physical therapy.  I do not believe that moving up the infusion today for Ocrevus is likely to be of any benefit.

## 2018-07-09 NOTE — Telephone Encounter (Signed)
I contacted the pt and offered an appt for tomorrow (07/10/18) at 3 pm with a check in time of 2:30 pm. Pt was agreeable and appt has been scheduled.

## 2018-07-10 ENCOUNTER — Ambulatory Visit: Payer: Medicare HMO | Admitting: Neurology

## 2018-07-10 ENCOUNTER — Encounter: Payer: Self-pay | Admitting: Neurology

## 2018-07-10 ENCOUNTER — Other Ambulatory Visit: Payer: Self-pay

## 2018-07-10 VITALS — BP 111/70 | HR 111 | Ht 65.0 in | Wt 183.0 lb

## 2018-07-10 DIAGNOSIS — M21371 Foot drop, right foot: Secondary | ICD-10-CM

## 2018-07-10 DIAGNOSIS — N319 Neuromuscular dysfunction of bladder, unspecified: Secondary | ICD-10-CM

## 2018-07-10 DIAGNOSIS — Z5181 Encounter for therapeutic drug level monitoring: Secondary | ICD-10-CM

## 2018-07-10 DIAGNOSIS — G35 Multiple sclerosis: Secondary | ICD-10-CM | POA: Diagnosis not present

## 2018-07-10 DIAGNOSIS — R269 Unspecified abnormalities of gait and mobility: Secondary | ICD-10-CM | POA: Diagnosis not present

## 2018-07-10 MED ORDER — PREDNISONE 10 MG PO TABS: ORAL_TABLET | ORAL | 0 refills | Status: DC

## 2018-07-10 NOTE — Progress Notes (Signed)
Reason for visit: Multiple sclerosis  Amber Williams is an 43 y.o. female  History of present illness:  Amber Williams is a 43 year old right-handed white female with a history of multiple sclerosis with a right hemiparesis and a gait disorder.  The patient has noted an increase in her right-sided weakness and increased difficulty with ambulation of the last 10 days or so.  The patient reports that she has some weakness in both legs but the right leg is worse, she has had some worsening of bladder function as well with increased frequency and incontinence.  She has not noted any visual field changes, but she has noted some cognitive alteration with word finding problems and some increased fatigue.  She has fallen on several occasions, she is now back to using a walker rather than a cane to get around.  She has a prominent right sided foot drop but she does not use her AFO brace.  She comes to the office today for an urgent evaluation.  She has not noted any fevers or chills or cough.  Past Medical History:  Diagnosis Date  . Allergy   . Arthritis   . Blood transfusion without reported diagnosis    2013  . Depression   . DVT (deep venous thrombosis) ([place]) 2013  . Gait abnormality 08/03/2016  . Hyperlipidemia   . Hypertension   . Multiple sclerosis ([place])   . Neurogenic bladder 08/03/2016  . Neuromuscular disorder ([place])    dx'd with MS at age of 61  . PTSD (post-traumatic stress disorder)   . Right foot drop 08/03/2016  . Thyroid disease   . Ulcer     Past Surgical History:  Procedure Laterality Date  . COLON SURGERY     Colonoscopy  . GASTRIC BYPASS  2010  . INTERSTIM IMPLANT PLACEMENT  2014  . PORTACATH PLACEMENT      Family History  Problem Relation Age of Onset  . Cancer Mother        Dx'd at age 66  . Breast cancer Mother   . Hypertension Father   . Diabetes Father   . Hypertension Sister   . Miscarriages / Stillbirths Sister   . Heart disease Maternal Grandmother    . Diabetes Maternal Grandmother   . Heart disease Maternal Grandfather   . Diabetes Paternal Grandmother   . Heart disease Paternal Grandfather   . Stroke Paternal Grandfather     Social history:  reports that she quit smoking about 9 years ago. She started smoking about 27 years ago. She has never used smokeless tobacco. She reports previous alcohol use. She reports that she does not use drugs.    Allergies  Allergen Reactions  . Other Hives    Plastic Tape  . Tecfidera [Dimethyl Fumarate] Itching    Medications:  Prior to Admission medications   Medication Sig Start Date End Date Taking? Authorizing Provider  ALLERGY RELIEF 180 MG tablet Take 180 mg by mouth as needed. 05/16/18   [provider]  amitriptyline (ELAVIL) 50 MG tablet Take 1 tablet (50 mg total) by mouth at bedtime. 06/10/18   [name], MD  Cholecalciferol (VITAMIN D3) 5000 units CAPS Take 5,000 Units by mouth every morning.    [provider]  flurazepam (DALMANE) 30 MG capsule Take 1 capsule (30 mg total) by mouth at bedtime as needed for sleep. 07/01/18   [name], MD  levothyroxine (SYNTHROID, LEVOTHROID) 100 MCG tablet TAKE 1 TABLET DAILY 06/25/18  [name], MD  mometasone (NASONEX) 50 MCG/ACT nasal spray  06/10/18   [provider]  Ocrelizumab (OCREVUS IV) Inject into the vein.    [provider]  omeprazole (PRILOSEC) 20 MG capsule TAKE 1 CAPSULE DAILY 06/25/18   [name], MD  vitamin B-12 (CYANOCOBALAMIN) 500 MCG tablet Take 500 mcg by mouth daily.    [provider]    ROS:  Out of a complete 14 system review of symptoms, the patient complains only of the following symptoms, and all other reviewed systems are negative.  Runny nose Eye itching Insomnia, frequent waking, daytime sleepiness Incontinence of the bladder, urinary urgency Back pain, muscle cramps, walking difficulty Bruising easily Memory loss, speech difficulty, weakness  Confusion, decreased concentration  Blood pressure 111/70, pulse (!) 111, height 5\' 5"  (1.651 m), weight 183 lb (83 kg).  Physical Exam  General: The patient is alert and cooperative at the time of the examination.  Respiratory: Lung fields are clear.  Eyes: Extraocular wounds are full, visual fields are full, disks are flat.  Pupils are equal, round, and reactive to light.  Cardiovascular: Regular rate and rhythm, no obvious murmurs or rubs are noted.  Neck: Neck is supple, no carotid bruits are noted.  Skin: No significant peripheral edema is noted.   Neurologic Exam  Mental status: The patient is alert and oriented x 3 at the time of the examination. The patient has apparent normal recent and remote memory, with an apparently normal attention span and concentration ability.   Cranial nerves: Facial symmetry is present. Speech is normal, no aphasia or dysarthria is noted. Extraocular movements are full. Visual fields are full.  Pupils are equal, round, and reactive to light.  Discs are flat bilaterally.  Motor: The patient has good strength in both upper extremities with exception of 4/5 strength of the right deltoid muscle.  In the lower extremities, the patient has significant weakness with hip flexion bilaterally, 2/5 on the right and 3/5 on the left.  The patient has 2/5 strength with knee extension on the right, 4-/5 on the left, she has a prominent foot drop on the right.  Sensory examination: Soft touch sensation is symmetric on the face, arms, and legs.  Vibration sensation is decreased on the right foot as compared to the left, symmetric in the arms.  Coordination: The patient has good finger-nose-finger and heel-to-shin bilaterally.  Gait and station: The patient is able to walk with a walker, she has a circumduction gait with the right leg, has difficulty clearing the floor with the toe, she drags the foot.  Gait is slow and deliberate.  Tandem gait was not attempted.   Reflexes: Deep tendon reflexes are symmetric.   Assessment/Plan:  1.  Multiple sclerosis with exacerbation  2.  Gait disorder  3.  Neurogenic bladder  The patient has had some worsening of her clinical state over the last 10 days.  The patient will be set up for home health physical therapy, she will undergo blood work and urinalysis today.  MRI of the brain and cervical spine will be done.  She will be given a prednisone Dosepak, 10 mg 12-day pack.  She will follow-up in 3 to 4 months.  Jill Alexanders MD 07/10/2018 3:03 PM  [facility] [address] [place], [place] [address]  Phone [phone] Fax [phone]

## 2018-07-11 ENCOUNTER — Telehealth: Payer: Self-pay | Admitting: Neurology

## 2018-07-11 LAB — URINALYSIS, ROUTINE W REFLEX MICROSCOPIC
Bilirubin, UA: NEGATIVE
Glucose, UA: NEGATIVE
KETONES UA: NEGATIVE
NITRITE UA: NEGATIVE
Protein, UA: NEGATIVE
RBC, UA: NEGATIVE
SPEC GRAV UA: 1.014 (ref 1.005–1.030)
UUROB: 0.2 mg/dL (ref 0.2–1.0)
pH, UA: 5.5 (ref 5.0–7.5)

## 2018-07-11 LAB — COMPREHENSIVE METABOLIC PANEL
ALT: 17 IU/L (ref 0–32)
AST: 17 IU/L (ref 0–40)
Albumin/Globulin Ratio: 3.2 — ABNORMAL HIGH (ref 1.2–2.2)
Albumin: 4.1 g/dL (ref 3.8–4.8)
Alkaline Phosphatase: 108 IU/L (ref 39–117)
BUN/Creatinine Ratio: 12 (ref 9–23)
BUN: 10 mg/dL (ref 6–24)
Bilirubin Total: <0.2 mg/dL (ref 0.0–1.2)
CALCIUM: 9.3 mg/dL (ref 8.7–10.2)
CO2: 24 mmol/L (ref 20–29)
Chloride: 106 mmol/L (ref 96–106)
Creatinine, Ser: 0.85 mg/dL (ref 0.57–1.00)
GFR calc Af Amer: 98 mL/min/{1.73_m2} (ref >59)
GFR, EST NON AFRICAN AMERICAN: 85 mL/min/{1.73_m2} (ref >59)
GLUCOSE: 77 mg/dL (ref 65–99)
Globulin, Total: 1.3 g/dL — ABNORMAL LOW (ref 1.5–4.5)
Potassium: 4.6 mmol/L (ref 3.5–5.2)
Sodium: 145 mmol/L — ABNORMAL HIGH (ref 134–144)
Total Protein: 5.4 g/dL — ABNORMAL LOW (ref 6.0–8.5)

## 2018-07-11 LAB — CBC WITH DIFFERENTIAL/PLATELET
BASOS: 1 %
Basophils Absolute: 0.1 10*3/uL (ref 0.0–0.2)
EOS (ABSOLUTE): 0.2 10*3/uL (ref 0.0–0.4)
Eos: 2 %
HEMATOCRIT: 34.1 % (ref 34.0–46.6)
Hemoglobin: 11.3 g/dL (ref 11.1–15.9)
IMMATURE GRANS (ABS): 0 10*3/uL (ref 0.0–0.1)
IMMATURE GRANULOCYTES: 1 %
LYMPHS: 14 %
Lymphocytes Absolute: 1.1 10*3/uL (ref 0.7–3.1)
MCH: 28.9 pg (ref 26.6–33.0)
MCHC: 33.1 g/dL (ref 31.5–35.7)
MCV: 87 fL (ref 79–97)
Monocytes Absolute: 0.8 10*3/uL (ref 0.1–0.9)
Monocytes: 10 %
NEUTROS PCT: 72 %
Neutrophils Absolute: 6.1 10*3/uL (ref 1.4–7.0)
PLATELETS: 381 10*3/uL (ref 150–450)
RBC: 3.91 x10E6/uL (ref 3.77–5.28)
RDW: 13.9 % (ref 11.7–15.4)
WBC: 8.3 10*3/uL (ref 3.4–10.8)

## 2018-07-11 LAB — MICROSCOPIC EXAMINATION
CASTS: NONE SEEN /LPF
RBC, UA: NONE SEEN /hpf (ref 0–2)
WBC, UA: >30 /hpf — AB (ref 0–5)

## 2018-07-11 NOTE — Telephone Encounter (Signed)
I called the patient.  The blood work showed a slightly elevated sodium level, slightly low total protein level, albumin is normal.  The patient has been eating fairly well, she is trying to lose weight.  The patient does have white blood cells in the urine, but the nitrites are negative, no bacteria are seen, and the pH of the urine is normal, I do not believe the patient has a bladder infection.  The patient will continue on with the prednisone, we will get MRI of the brain and cervical spine.  Physical therapy will come out to the house.

## 2018-07-12 ENCOUNTER — Telehealth: Payer: Self-pay | Admitting: Neurology

## 2018-07-12 DIAGNOSIS — G35 Multiple sclerosis: Secondary | ICD-10-CM

## 2018-07-12 NOTE — Telephone Encounter (Signed)
Pt states she has spoken with [facility] this morning and was told her MRI needs to be done at [facility]. Please call

## 2018-07-15 ENCOUNTER — Telehealth: Payer: Self-pay | Admitting: Neurology

## 2018-07-15 ENCOUNTER — Other Ambulatory Visit: Payer: Self-pay | Admitting: Neurology

## 2018-07-15 DIAGNOSIS — G35 Multiple sclerosis: Secondary | ICD-10-CM

## 2018-07-15 NOTE — Telephone Encounter (Signed)
[employer] medicare Josem Kaufmann: [id number] (exp. 07/15/18 to 10/13/18)  Since the patient has a bladder stimulator she will have to go to [facility]. When you get a chance can you put a new order in for [facility].

## 2018-07-15 NOTE — Addendum Note (Signed)
Addended by: Kathrynn Ducking on: 07/15/2018 08:48 AM   Modules accepted: Orders

## 2018-07-15 NOTE — Telephone Encounter (Signed)
The patient was initially set up through this office for IV Solu-Medrol, she sent a message back indicating that she wanted a home health nursing agency, I tried to contact her regarding this, she has already scheduled an appointment to come to our office today for an infusion.  Her telephone is busy, cannot get through.

## 2018-07-15 NOTE — Telephone Encounter (Signed)
I will rewrite the MRI orders.

## 2018-07-15 NOTE — Telephone Encounter (Signed)
I faxed the information to [facility] for them to review.. I spoke to the patient and informed her of this.

## 2018-07-16 ENCOUNTER — Other Ambulatory Visit: Payer: Self-pay | Admitting: Family Medicine

## 2018-07-17 NOTE — Telephone Encounter (Signed)
Last seen 07/01/2018

## 2018-07-18 ENCOUNTER — Telehealth: Payer: Self-pay | Admitting: Neurology

## 2018-07-18 NOTE — Telephone Encounter (Signed)
Malachy Mood from [facility] called informing Amber Williams that she went yesterday and started the pt's IV Solu-Medrol for four days. Malachy Mood is wanting to know the update on the signature for the order. Please advise.

## 2018-07-18 NOTE — Telephone Encounter (Signed)
Signed order for Solumedrol 500 mg iv q24, administered iv via rate flow tubing at 100 ml Per her through ivad with pump administration bag & all supplies. Change dressings on line weekly and as needed.   Flush with 5 to 10 mL of NS and 3-76mL heparin 10 units/ml per SASH technique.   Order faxed to [phone] confirmation received.

## 2018-07-18 NOTE — Telephone Encounter (Signed)
Patient is scheduled at [facility] for 08/22/18.

## 2018-07-18 NOTE — Telephone Encounter (Signed)
I called [facility] they informed me it is MRI safe but they are in the process of finding if a rep needs to be with her because of the stimulator. If a rep is needed it will have to be pushed out until April or so because they are not going into the hospitals right now.

## 2018-07-18 NOTE — Telephone Encounter (Signed)
Order has been received. Waiting on MD to sign.

## 2018-07-24 ENCOUNTER — Telehealth: Payer: Self-pay

## 2018-07-24 NOTE — Telephone Encounter (Signed)
Due to current COVID 19 pandemic, our office is severely reducing in office visits for at least the next 2 weeks, in order to minimize the risk to our patients and healthcare providers.  I called pt and explained this to her. I offered pt a virtual visit or a telephone visit. Pt does not have wifi at her house and would prefer a phone visit.  Pt understands that although there may be some limitations with this type of visit, we will take all precautions to reduce any security or privacy concerns.  Pt understands that this will be treated like an in office visit and we will file with pt's insurance, and there may be a patient responsible charge related to this service.  Pts meds, allergies, and PMH were updated. Pt has never had a sleep study but does endorse snoring.  Epworth Sleepiness Scale 0= would never doze 1= slight chance of dozing 2= moderate chance of dozing 3= high chance of dozing  Sitting and reading: 0 Watching TV: 1 Sitting inactive in a public place (ex. Theater or meeting): 0 As a passenger in a car for an hour without a break: 0 Lying down to rest in the afternoon: 2 Sitting and talking to someone: 0 Sitting quietly after lunch (no alcohol): 0 In a car, while stopped in traffic: 0 Total: 3  FSS: 34  Please call pt at [phone].

## 2018-07-25 ENCOUNTER — Other Ambulatory Visit: Payer: Self-pay

## 2018-07-25 ENCOUNTER — Ambulatory Visit (INDEPENDENT_AMBULATORY_CARE_PROVIDER_SITE_OTHER): Payer: Medicare HMO | Admitting: Neurology

## 2018-07-25 DIAGNOSIS — E669 Obesity, unspecified: Secondary | ICD-10-CM

## 2018-07-25 DIAGNOSIS — G4700 Insomnia, unspecified: Secondary | ICD-10-CM

## 2018-07-25 DIAGNOSIS — G35D Multiple sclerosis, unspecified: Secondary | ICD-10-CM

## 2018-07-25 DIAGNOSIS — R351 Nocturia: Secondary | ICD-10-CM

## 2018-07-25 DIAGNOSIS — G479 Sleep disorder, unspecified: Secondary | ICD-10-CM

## 2018-07-25 DIAGNOSIS — Z820 Family history of epilepsy and other diseases of the nervous system: Secondary | ICD-10-CM | POA: Diagnosis not present

## 2018-07-25 DIAGNOSIS — R0683 Snoring: Secondary | ICD-10-CM

## 2018-07-25 DIAGNOSIS — G35 Multiple sclerosis: Secondary | ICD-10-CM | POA: Diagnosis not present

## 2018-07-25 NOTE — Patient Instructions (Signed)
Given over the phone during today's phone call virtual visit.  

## 2018-07-25 NOTE — Progress Notes (Signed)
Virtual Visit via Phone Note on 07/25/2018:  I connected with Amber Williams on 07/25/18 at  3:30 PM EDT by a video enabled telemedicine application and verified that I am speaking with the correct person using two identifiers.   I discussed the limitations of evaluation and management by telemedicine and the availability of in person appointments. The patient expressed understanding and agreed to proceed.  History of Present Illness:  [name] is a 43 year-old right-handed woman with an underlying medical history of multiple sclerosis (followed by Dr. Jann[facility]), hypertension, hyperlipidemia, cholelithiasis, lumbar degenerative disc disease, history of DVT, allergies, arthritis, and obesity, with status post gastric bypass surgery in 2010, she is also status post InterStim implant, with whom I am conducting a new patient telephone visit today, in lieu of a face-to-face visit, for evaluation of her sleep disorder, in particular, her longer standing history of difficulty initiating and maintaining sleep. The patient is referred by her primary care physician, Dr. [name]. I reviewed Dr. Artemio Aly office note from 07/01/2018. She has been on flurazepam 30 mg at bedtime as needed. Her Epworth sleepiness score is 3 out of 24, fatigue severity score is 34 out of 63.  She reports that the lorazepam has been helpful. She has been on it for the past 2 months or so. Prior to that she has tried and failed multiple prescription and nonprescription medications for sleep. She was able to remember Ambien and trazodone. In addition, I reviewed prior prescriptions in her chart which included clonazepam, doxepin, mirtazapine, and temazepam. She reports that most of these medications just stopped working after a while. Ambien worked for some time, doxepin also worked for some time. Some of these medications just did not help. She had weight loss surgery some 10 years ago and has been able to maintain her  weight below 200 consistently. Weight has been fluctuating. She also reports that she had alcohol dependence and would use alcohol as a sleep aid and for the past nearly one year she has been completely sober, and will be one year in April 2020 by self-report and she reports that she had difficulty with excessive alcohol use for about 2-1/2 years. She has a family history of sleep apnea, her maternal grandmother has been diagnosed with it and has a CPAP machine and her father recently was diagnosed with it and is in the process of getting a CPAP machine. Patient does not work, she lives with her boyfriend. She tries to be in bed between 8:30 and 9, they turn the TV off at night. She has a rise time between 6:15 AM and 6:30 AM. She has nocturia about twice per average night, reports that the InterStim has been quite helpful. She denies any restless leg symptoms. She does snore per boyfriend. She has a dog in the household, dog typically sleeps in the bedroom but not typically on their bed. She would be willing to consider sleep testing and also would be willing to consider treatment with an AutoPap machine if she has obstructive sleep apnea.    Observations/Objective:  Her most recent vital signs on file were reviewed from 07/10/2018: Blood pressure 111/70 with a pulse of 111, weight 183 for a BMI of 30.45.  She is able to relate her history over the phone. She is not in any distress, speech is easy to understand, not dysarthric, no voice tremor noted. Her current neck circumference by self-report is 13.5 inches. She has not had a tonsillectomy.  Assessment  and Plan:  In summary, Amber Williams is a very pleasant 43 y.o.-year old female with an underlying medical history of multiple sclerosis (followed by Dr. [facility]), hypertension, hyperlipidemia, cholelithiasis, lumbar degenerative disc disease, history of DVT, allergies, arthritis, and obesity, with status post gastric bypass surgery in 2010, she is  also status post InterStim implant, with whom I had a phone visit for new evaluation of her sleep disturbance. She reports difficulty initiating and maintaining sleep, some snoring, nocturia and a family history of OSA. She could be at risk for sleep apnea given these constellation of symptoms. For symptomatic treatment she has been on flurazepam per primary care physician. I talked to the patient about looking for an underlying organic sleep disorder, which should be ideally with a laboratory attended PSG. Currently we are not able to do laboratory based studies. We talked about the diagnosis of OSA, and treatment options. She would be willing to try treatment with a CPAP likely machine, I would like to pursue a home sleep test at this point and take it from there. I explained the risks and ramifications of untreated moderate to severe OSA, especially with respect to developing cardiovascular disease down the Road, including congestive heart failure, difficult to treat hypertension, cardiac arrhythmias, or stroke.  I explained the sleep test procedure to the patient. She indicated that she would be willing to try CPAP if the need arises. I plan to see her back after testing is completed. I answered all her questions today and she was in agreement.  Star Age, MD, PhD  Follow Up Instructions: 1. HST 2. FU after testing.    I discussed the assessment and treatment plan with the patient. The patient was provided an opportunity to ask questions and all were answered. The patient agreed with the plan and demonstrated an understanding of the instructions.   The patient was advised to call back or seek an in-person evaluation if the symptoms worsen or if the condition fails to improve as anticipated.  I provided 18 minutes of non-face-to-face time during this encounter.   Star Age, MD

## 2018-07-31 ENCOUNTER — Other Ambulatory Visit: Payer: Self-pay

## 2018-07-31 ENCOUNTER — Ambulatory Visit (INDEPENDENT_AMBULATORY_CARE_PROVIDER_SITE_OTHER): Payer: Medicare HMO | Admitting: Family Medicine

## 2018-07-31 ENCOUNTER — Encounter: Payer: Self-pay | Admitting: Family Medicine

## 2018-07-31 DIAGNOSIS — G35 Multiple sclerosis: Secondary | ICD-10-CM | POA: Diagnosis not present

## 2018-07-31 DIAGNOSIS — F324 Major depressive disorder, single episode, in partial remission: Secondary | ICD-10-CM

## 2018-07-31 DIAGNOSIS — G4701 Insomnia due to medical condition: Secondary | ICD-10-CM | POA: Diagnosis not present

## 2018-07-31 NOTE — Addendum Note (Signed)
Addended by: [name] on: 07/31/2018 11:14 AM   Modules accepted: Level of Service

## 2018-07-31 NOTE — Progress Notes (Addendum)
Subjective:  Patient ID: Amber Williams, female    DOB: 07-Dec-1975  Age: 43 y.o. MRN: [id number]  CC: No chief complaint on file.   HPI Amber Williams presents for insomnia. Had exacerbation of MS.Using prednisone and solumedrol. Wasn't able to sleep much. Now off the steroids and sleep has improved. Sleeping 8-9 hours a night. No hangover in AM anymore. Occasionally has a weird dream. Feeling anxious about COVID due to her immune suppression. Other occasional feeling bad about self. Otherwise depression has lifted.  Depression screen [facility] 2/9 07/01/2018 06/18/2018 06/10/2018  Decreased Interest 1 2 3   Down, Depressed, Hopeless 1 2 2   PHQ - 2 Score 2 4 5   Altered sleeping 1 3 3   Tired, decreased energy 1 2 3   Change in appetite 1 2 3   Feeling bad or failure about yourself  1 1 3   Trouble concentrating 0 2 3  Moving slowly or fidgety/restless 0 0 2  Suicidal thoughts 1 0 1  PHQ-9 Score 7 14 23     History Amber Williams has a past medical history of Allergy, Arthritis, Blood transfusion without reported diagnosis, Depression, DVT (deep venous thrombosis) ([place]) (2013), Gait abnormality (08/03/2016), Hyperlipidemia, Hypertension, Multiple sclerosis ([place]), Neurogenic bladder (08/03/2016), Neuromuscular disorder ([place]), PTSD (post-traumatic stress disorder), Right foot drop (08/03/2016), Thyroid disease, and Ulcer.   She has a past surgical history that includes Colon surgery; Gastric bypass (2010); Interstim Implant placement (2014); and Portacath placement.   Her family history includes Breast cancer in her mother; Cancer in her mother; Diabetes in her father, maternal grandmother, and paternal grandmother; Heart disease in her maternal grandfather, maternal grandmother, and paternal grandfather; Hypertension in her father and sister; Miscarriages / [name] in her sister; Stroke in her paternal grandfather.She reports that she quit smoking about 9 years ago. She started smoking about 27 years ago. She  has never used smokeless tobacco. She reports previous alcohol use. She reports that she does not use drugs.    ROS Review of Systems  Objective:  There were no vitals taken for this visit.  BP Readings from Last 3 Encounters:  07/10/18 111/70  07/01/18 101/63  06/18/18 116/80    Wt Readings from Last 3 Encounters:  07/10/18 183 lb (83 kg)  06/18/18 172 lb 8 oz (78.2 kg)  06/10/18 178 lb 6 oz (80.9 kg)     Physical Exam  Phone  Assessment & Plan:   Diagnoses and all orders for this visit:  Insomnia due to medical condition  Major depressive disorder in partial remission, unspecified whether recurrent ([place])  Multiple sclerosis ([place])       I have discontinued Amber Williams's predniSONE. [name] Vitamin D3, Ocrelizumab (OCREVUS IV), vitamin B-12, Allergy Relief, amitriptyline, mometasone, omeprazole, levothyroxine, and flurazepam.  Allergies as of 07/31/2018      Reactions   Other Hives   Plastic Tape   Tecfidera [dimethyl Fumarate] Itching      Medication List       Accurate as of July 31, 2018 10:37 AM. Always use your most recent med list.        Allergy Relief 180 MG tablet Generic drug:  fexofenadine Take 180 mg by mouth as needed.   amitriptyline 50 MG tablet Commonly known as:  ELAVIL Take 1 tablet (50 mg total) by mouth at bedtime.   flurazepam 30 MG capsule Commonly known as:  DALMANE TAKE 1 CAPSULE BY MOUTH AT BEDTIME AS NEEDED FOR  SLEEP  levothyroxine 100 MCG tablet Commonly known as:  SYNTHROID, LEVOTHROID TAKE 1 TABLET DAILY   mometasone 50 MCG/ACT nasal spray Commonly known as:  NASONEX   OCREVUS IV Inject into the vein.   omeprazole 20 MG capsule Commonly known as:  PRILOSEC TAKE 1 CAPSULE DAILY   vitamin B-12 500 MCG tablet Commonly known as:  CYANOCOBALAMIN Take 500 mcg by mouth daily.   Vitamin D3 125 MCG (5000 UT) Caps Take 5,000 Units by mouth every morning.      Virtual Visit  via telephone Note  I discussed the limitations, risks, security and privacy concerns of performing an evaluation and management service by telephone and the availability of in person appointments. I also discussed with the patient that there may be a patient responsible charge related to this service. The patient expressed understanding and agreed to proceed.  Follow Up Instructions:   I discussed the assessment and treatment plan with the patient. The patient was provided an opportunity to ask questions and all were answered. The patient agreed with the plan and demonstrated an understanding of the instructions.   The patient was advised to call back or seek an in-person evaluation if the symptoms worsen or if the condition fails to improve as anticipated.  Visit started: 10:17 Call ended:  10:35 Visit minutes: 18   Follow-up: Return in about 3 months (around 10/30/2018).  [name], M.D.

## 2018-08-01 ENCOUNTER — Other Ambulatory Visit: Payer: Self-pay

## 2018-08-01 ENCOUNTER — Ambulatory Visit (INDEPENDENT_AMBULATORY_CARE_PROVIDER_SITE_OTHER): Payer: Medicare HMO | Admitting: Neurology

## 2018-08-01 DIAGNOSIS — G4710 Hypersomnia, unspecified: Secondary | ICD-10-CM | POA: Diagnosis not present

## 2018-08-01 DIAGNOSIS — E669 Obesity, unspecified: Secondary | ICD-10-CM

## 2018-08-01 DIAGNOSIS — R0683 Snoring: Secondary | ICD-10-CM

## 2018-08-01 DIAGNOSIS — G4700 Insomnia, unspecified: Secondary | ICD-10-CM

## 2018-08-01 DIAGNOSIS — G479 Sleep disorder, unspecified: Secondary | ICD-10-CM

## 2018-08-01 DIAGNOSIS — G35 Multiple sclerosis: Secondary | ICD-10-CM

## 2018-08-01 DIAGNOSIS — Z820 Family history of epilepsy and other diseases of the nervous system: Secondary | ICD-10-CM

## 2018-08-01 DIAGNOSIS — R351 Nocturia: Secondary | ICD-10-CM

## 2018-08-06 ENCOUNTER — Ambulatory Visit: Payer: Medicare HMO | Admitting: Family Medicine

## 2018-08-14 ENCOUNTER — Telehealth: Payer: Self-pay | Admitting: Neurology

## 2018-08-14 NOTE — Telephone Encounter (Signed)
I returned Ashley's call. She is needed to verify if the MRI orders placed are urgent?  She states if the order is not urgent the pt will be scheduled out for after June the 1st.   If the order is urgent she is asking for a call stating such.  I advised I would fwd to MD to review and advise.

## 2018-08-14 NOTE — Telephone Encounter (Signed)
Please call Caryl Pina from [facility] at [phone] she is having to r/s appts and wants to know if it is ok to r/s [place] dg

## 2018-08-14 NOTE — Telephone Encounter (Signed)
I called Amber Williams, they will keep the MRI schedule as is, I do not want to wait till June.  The patient is on Ocrevus with a recent MS exacerbation.

## 2018-08-15 ENCOUNTER — Telehealth: Payer: Self-pay | Admitting: Neurology

## 2018-08-15 NOTE — Telephone Encounter (Signed)
Melanie from [facility] called requesting to speak with you about the authorization for pt's MRI. Requested a call back at [phone]

## 2018-08-15 NOTE — Telephone Encounter (Signed)
I called Melanie with [place] precert line and left her a voicemail  Informing her there is an authorization on file with [employer] with her insurance [employer].   [employer] medicare Josem Kaufmann: [id number] (exp. 07/15/18 to 10/13/18)

## 2018-08-15 NOTE — Telephone Encounter (Signed)
I spoke to [facility] she informed me that it is approved for Cervical w/wo contrast and needs to be switched. I called [employer] and got the authorization switched for MRI Cervical wo contrast and the Brain still stayed the same for Brain w/wo contrast.

## 2018-08-15 NOTE — Telephone Encounter (Signed)
Melanie from the preservice center called and stated the patient needs authorization for her insurance   CB# [phone]

## 2018-08-19 ENCOUNTER — Encounter: Payer: Self-pay | Admitting: Neurology

## 2018-08-19 ENCOUNTER — Telehealth: Payer: Self-pay

## 2018-08-19 NOTE — Telephone Encounter (Signed)
-----   Message from Star Age, MD sent at 08/19/2018 11:28 AM EDT ----- Patient referred by Dr. Livia Snellen, VV by phone with me on 07/25/18, HST on 08/03/18.   Please call and notify the patient that the recent home sleep test did not show any significant obstructive sleep apnea, with some snoring noted. Patient can follow up with the referring provider.  Please remind patient to try to maintain good sleep hygiene, which means: Keep a regular sleep and wake schedule and make enough time for sleep (7 1/2 to 8 1/2 hours for the average adult), try not to exercise or have a meal within 2 hours of your bedtime, try to keep your bedroom conducive for sleep, that is, cool and dark, without light distractors such as an illuminated alarm clock, and refrain from watching TV right before sleep or in the middle of the night and do not keep the TV or radio on during the night. If a nightlight is used, have it away from the visual field. Also, try not to use or play on electronic devices at bedtime, such as your cell phone, tablet PC or laptop. If you like to read at bedtime on an electronic device, try to dim the background light as much as possible. Do not eat in the middle of the night. Keep pets away from the bedroom environment. For stress relief, try meditation, deep breathing exercises (there are many books and CDs available), a white noise machine or fan can help to diffuse other noise distractors, such as traffic noise. Do not drink alcohol before bedtime, as it can disturb sleep and cause middle of the night awakenings. Never mix alcohol and sedating medications! Avoid narcotic pain medication close to bedtime, as opioids/narcotics can suppress breathing drive and breathing effort.    Thanks,  Star Age, MD, PhD [facility])

## 2018-08-19 NOTE — Progress Notes (Signed)
Patient referred by Dr. Livia Snellen, VV by phone with me on 07/25/18, HST on 08/03/18.   Please call and notify the patient that the recent home sleep test did not show any significant obstructive sleep apnea, with some snoring noted. Patient can follow up with the referring provider.  Please remind patient to try to maintain good sleep hygiene, which means: Keep a regular sleep and wake schedule and make enough time for sleep (7 1/2 to 8 1/2 hours for the average adult), try not to exercise or have a meal within 2 hours of your bedtime, try to keep your bedroom conducive for sleep, that is, cool and dark, without light distractors such as an illuminated alarm clock, and refrain from watching TV right before sleep or in the middle of the night and do not keep the TV or radio on during the night. If a nightlight is used, have it away from the visual field. Also, try not to use or play on electronic devices at bedtime, such as your cell phone, tablet PC or laptop. If you like to read at bedtime on an electronic device, try to dim the background light as much as possible. Do not eat in the middle of the night. Keep pets away from the bedroom environment. For stress relief, try meditation, deep breathing exercises (there are many books and CDs available), a white noise machine or fan can help to diffuse other noise distractors, such as traffic noise. Do not drink alcohol before bedtime, as it can disturb sleep and cause middle of the night awakenings. Never mix alcohol and sedating medications! Avoid narcotic pain medication close to bedtime, as opioids/narcotics can suppress breathing drive and breathing effort.    Thanks,  Star Age, MD, PhD [facility])

## 2018-08-19 NOTE — Procedures (Signed)
Patient Information     First Name: Amber Last Name: Williams ID: [id number]  Birth Date: Apr 12, 1976 Age: 43 Gender: Female  Insurer:  BMI: 30.5 (W=183 lb, H=5' 5'')  Neck Circ.: Address: [address] [place]:  3 Mobile Phone:  Sleep Study Information    Study Date: Aug 01, 2018 S/H/A Version: [id number] / [id number] / 76            Referring Physician: Dr. [name] Comments:   43 year old woman with a history of multiple sclerosis (followed by Dr. Jannifer Franklin), hypertension, hyperlipidemia, cholelithiasis, lumbar degenerative disc disease, history of DVT, allergies, arthritis, and obesity, with status post gastric bypass surgery in 2010, and status post InterStim implant, who reports difficulty initiating and maintaining sleep.            Diagnosis:      Snoring  Recommendations:     This home sleep test does not demonstrate any significant obstructive or central sleep disordered breathing. Some snoring was noted and appeared to be mostly mild and intermittent. Other causes of the patient's symptoms, including circadian rhythm disturbances, an underlying mood disorder, medication effect and/or an underlying medical problem cannot be ruled out based on this test. Clinical correlation is recommended. The patient should be cautioned not to drive, work at heights, or operate dangerous or heavy equipment when tired or sleepy. Review and reiteration of good sleep hygiene measures should be pursued with any patient. The patient can follow up with her referring provider, who will be notified of the test results.   I certify that I have reviewed the raw data recording prior to the issuance of this report in accordance with the standards of the American Academy of Sleep Medicine (AASM).  Star Age, MD, PhD Diplomat, ABPN (Neurology and Sleep)                      Sleep Summary  Oxygen Saturation Statistics   Start Study Time: End Study Time: Total Recording Time:  8:30:30 PM  6:30:30 AM 10 hrs, 0 min  Total Sleep Time % REM of Sleep Time:  8 hrs, 40 min 23.3    Mean: 96 Minimum: 93 Maximum: 100  Mean of Desaturations Nadirs (%):   92  Oxygen Desatur. %:   4-9 10-20 >20 Total  Events Number Total    3  1 75.0 25.0  0 0.0  4 100.0  Oxygen Saturation: <90 <=88 <85 <80 <70  Duration (minutes): Sleep % 0.0 0.0  0.0 0.0  0.0 0.0 0.0 0.0 0.0 0.0     Respiratory Indices      Total Events REM NREM All Night  pRDI:  18  pAHI:  16 ODI:  4   pAHIc:  0   % CSR: 0.0 6.6 6.1 2.0 0.0 0.8 0.6 0.0 0.0 2.1 1.9 0.5 0.0       Pulse Rate Statistics during Sleep (BPM)      Mean: 88 Minimum: 51 Maximum: 111    Indices are calculated using technically valid sleep time of  8 hrs, 35 min. Central-Indices are calculated using technically valid sleep time of  8  hrs, 30 min. pRDI/pAHI are calculated using oxi desaturations ? 3% Sit N/A Body Position Statistics  Position Supine Prone Right Left Non-Supine  Sleep (min) 82.5 205.0 0.0 232.5 437.5  Sleep % 15.9 39.4 0.0 44.7 84.1  pRDI 0.7 3.9 N/A 1.0 2.4  pAHI 0.7 3.6 N/A 0.8 2.1  ODI 0.0 0.9 N/A 0.3 0.6     Snoring Statistics Snoring Level (dB) >40 >50 >60 >70 >80 >Threshold (45)  Sleep (min) 80.3 11.7 1.1 0.0 0.0 48.2  Sleep % 15.4 2.2 0.2 0.0 0.0 9.3    Mean: 41 dB Sleep Stages Chart   * Reference values are according to AASM guidelines

## 2018-08-19 NOTE — Telephone Encounter (Signed)
I called pt. I advised pt that Dr. Rexene Alberts reviewed pt's sleep study and found that pt did not show any significant osa, some snoring was noted. Dr. Rexene Alberts recommends that pt follow up with Dr. Livia Snellen. I reviewed sleep hygiene recommendations with the pt, including trying to keep a regular sleep wake schedule, avoiding electronics in the bedroom, keeping the bedroom cool, dark, and quiet, and avoiding eating or exercising within 2 hours of bedtime as well as eating in the middle of the night. I advised pt to keep pets out of the bedroom. I discussed with pt the importance of stress relief and to try meditation, deep breathing exercises, and/or a white noise machine or fan to diffuse other noise distractors. I advised pt to not drink alcohol before bedtime and to never mix alcohol and sedating medications. Pt was advised to avoid narcotic pain medication close to bedtime. I advised pt that a copy of these sleep study results will be sent to Dr. Livia Snellen. Pt verbalized understanding of results. Pt had no questions at this time but was encouraged to call back if questions arise.

## 2018-08-22 ENCOUNTER — Ambulatory Visit (HOSPITAL_COMMUNITY)
Admission: RE | Admit: 2018-08-22 | Discharge: 2018-08-22 | Disposition: A | Discharge status: Home / Self Care | Payer: Medicare HMO | Source: Ambulatory Visit | Attending: Neurology | Admitting: Neurology

## 2018-08-22 ENCOUNTER — Telehealth: Payer: Self-pay | Admitting: Neurology

## 2018-08-22 ENCOUNTER — Encounter (HOSPITAL_COMMUNITY): Payer: Self-pay

## 2018-08-22 ENCOUNTER — Other Ambulatory Visit: Payer: Self-pay

## 2018-08-22 DIAGNOSIS — G35 Multiple sclerosis: Secondary | ICD-10-CM

## 2018-08-22 MED ORDER — GADOBUTROL 1 MMOL/ML IV SOLN
7.5000 mL | Freq: Once | INTRAVENOUS | Status: AC | PRN
  Administered 2018-08-22: 7.5 mL via INTRAVENOUS

## 2018-08-22 MED ORDER — SODIUM CHLORIDE 0.9% FLUSH
10.0000 mL | INTRAVENOUS | Status: DC | PRN
  Administered 2018-08-22: 10 mL
  Filled 2018-08-22: qty 40

## 2018-08-22 MED ORDER — HEPARIN SOD (PORK) LOCK FLUSH 100 UNIT/ML IV SOLN
500.0000 [IU] | INTRAVENOUS | Status: AC | PRN
  Administered 2018-08-22: 500 [IU]

## 2018-08-22 NOTE — Telephone Encounter (Signed)
I called the patient.  MRI of the brain appears to be stable, MRI of the cervical spine was not done.  MRI brain 08/22/18:  IMPRESSION: Stable chronic multiple sclerosis. No evidence of acute plaque activity. No areas of reduced diffusion or abnormal postcontrast enhancement.

## 2018-08-29 ENCOUNTER — Other Ambulatory Visit: Payer: Self-pay | Admitting: Family Medicine

## 2018-08-30 DIAGNOSIS — K644 Residual hemorrhoidal skin tags: Secondary | ICD-10-CM | POA: Insufficient documentation

## 2018-09-04 ENCOUNTER — Telehealth: Payer: Self-pay

## 2018-09-04 NOTE — Telephone Encounter (Signed)
PA has been submitted for Ocrevus 600 mg.  Fax # [phone].  Confirmation has been received and copy of fax given to [place], RN with [facility].

## 2018-09-11 ENCOUNTER — Telehealth: Payer: Self-pay | Admitting: Neurology

## 2018-09-11 NOTE — Telephone Encounter (Signed)
[employer] was receiving aid with Ocrevus payment through [employer] for the 2019 year. [employer] has since lost this funding due to [employer]'s lack of funds. Patient applied to [facility] patient foundation and has been approved.  I contacted Amber Williams with [place] who stated the pharmacy needed an updated order for Ocrevus infusion. I was connected with Amber Williams, pharmacist at [facility] and was able to provide verbal order for Ocrevus.   Order: Ocrevus, 600 mg every 6 months.   Amber Williams stated Amber Williams would be reaching out to our office in regards to a shipment date for the med and I provided [employer]'s #.  I contacted the patient and was able to advise on this information and patient verbalized understanding.   I advised Amber Williams with [facility] about the status of this request and she verbalized understanding but requested I verify if the patient would have a copayment for the drug.   I was able to speak with Amber Williams with Amber Williams who confirmed the only payment the ptwould be responsible for would be the admin fee. Amber Williams is going to confirm what this cost would be.

## 2018-09-11 NOTE — Telephone Encounter (Signed)
This patient apparently has lost funding for her Ocrevus therapies.  We will see if we can find a payer source for her medication, otherwise we may have to switch to another drug.

## 2018-09-12 ENCOUNTER — Other Ambulatory Visit: Payer: Self-pay | Admitting: Family Medicine

## 2018-09-12 NOTE — Telephone Encounter (Signed)
Spoke with [place] with [facility] and was able to pick shipment date of 09/17/18 for ocrevus.   Pt has been offered and infusion date of 09/25/18 at 7:30. Waiting to see if she accepts.

## 2018-10-01 ENCOUNTER — Other Ambulatory Visit: Payer: Self-pay

## 2018-10-01 ENCOUNTER — Ambulatory Visit (INDEPENDENT_AMBULATORY_CARE_PROVIDER_SITE_OTHER): Payer: Medicare HMO | Admitting: Family Medicine

## 2018-10-01 ENCOUNTER — Encounter: Payer: Self-pay | Admitting: Family Medicine

## 2018-10-01 DIAGNOSIS — K648 Other hemorrhoids: Secondary | ICD-10-CM | POA: Diagnosis not present

## 2018-10-01 DIAGNOSIS — G4701 Insomnia due to medical condition: Secondary | ICD-10-CM

## 2018-10-01 MED ORDER — DOCUSATE SODIUM 100 MG PO CAPS: 100.0000 mg | ORAL_CAPSULE | Freq: Two times a day (BID) | ORAL | 2 refills | Status: DC

## 2018-10-01 MED ORDER — FLURAZEPAM HCL 30 MG PO CAPS: 30.0000 mg | ORAL_CAPSULE | Freq: Every day | ORAL | 2 refills | Status: DC

## 2018-10-01 MED ORDER — HYDROCORTISONE (PERIANAL) 2.5 % EX CREA: 1.0000 "application " | TOPICAL_CREAM | Freq: Two times a day (BID) | CUTANEOUS | 5 refills | Status: DC

## 2018-10-01 NOTE — Progress Notes (Signed)
Subjective:    Patient ID: Amber Williams, female    DOB: 1976-04-27, 43 y.o.   MRN: [id number]   HPI: Amber Williams is a 43 y.o. female presenting for worsening hemorrhoids. Using  OTC ointment and wipes. Feels 3 bumps at rectum now. Not painful, but are uncomfortable. Has more bleeding with bowel movements.   Sleeping well now. Still using dalmane nightly. Toxassure nml three months ago. PDMP shows no irregularity. LME=2.0  Follow up of thyroid. No excesive cold or heat intolerance. Denies constipation, diarrhea. No hair loss. Energy is always low due to MS. However, it is stable. Home televisit today due to her ongoing use of ocrevus, an immune - suppressing MS drug.   Depression screen [facility] 2/9 07/01/2018 06/18/2018 06/10/2018 05/22/2018 02/04/2018  Decreased Interest 1 2 3  0 0  Down, Depressed, Hopeless 1 2 2 1  0  PHQ - 2 Score 2 4 5 1  0  Altered sleeping 1 3 3  - -  Tired, decreased energy 1 2 3  - -  Change in appetite 1 2 3  - -  Feeling bad or failure about yourself  1 1 3  - -  Trouble concentrating 0 2 3 - -  Moving slowly or fidgety/restless 0 0 2 - -  Suicidal thoughts 1 0 1 - -  PHQ-9 Score 7 14 23  - -     Relevant past medical, surgical, family and social history reviewed and updated as indicated.  Interim medical history since our last visit reviewed. Allergies and medications reviewed and updated.  ROS:  Review of Systems  Constitutional: Negative for activity change.  Gastrointestinal: Positive for anal bleeding and blood in stool. Negative for abdominal distention, abdominal pain, constipation, diarrhea, nausea, rectal pain and vomiting.  Psychiatric/Behavioral: Negative for dysphoric mood and sleep disturbance. The patient is not nervous/anxious.      Social History   Tobacco Use  Smoking Status Former Smoker  . Start date: 67  . Last attempt to quit: 01/30/2009  . Years since quitting: 9.6  Smokeless Tobacco Never Used       Objective:     Wt  Readings from Last 3 Encounters:  07/10/18 183 lb (83 kg)  06/18/18 172 lb 8 oz (78.2 kg)  06/10/18 178 lb 6 oz (80.9 kg)     Exam deferred. Pt. Harboring due to COVID 19. Phone visit performed.   Assessment & Plan:   1. Prolapsed internal hemorrhoids   2. Insomnia due to medical condition     Meds ordered this encounter  Medications  . docusate sodium (COLACE) 100 MG capsule    Sig: Take 1 capsule (100 mg total) by mouth 2 (two) times daily.    Dispense:  60 capsule    Refill:  2  . hydrocortisone (ANUSOL-HC) 2.5 % rectal cream    Sig: Place 1 application rectally 2 (two) times daily.    Dispense:  28 g    Refill:  5  . flurazepam (DALMANE) 30 MG capsule    Sig: Take 1 capsule (30 mg total) by mouth at bedtime.    Dispense:  30 capsule    Refill:  2    No orders of the defined types were placed in this encounter.     Diagnoses and all orders for this visit:  Prolapsed internal hemorrhoids  Insomnia due to medical condition  Other orders -     docusate sodium (COLACE) 100 MG capsule; Take 1 capsule (100 mg total) by mouth  2 (two) times daily. -     hydrocortisone (ANUSOL-HC) 2.5 % rectal cream; Place 1 application rectally 2 (two) times daily. -     flurazepam (DALMANE) 30 MG capsule; Take 1 capsule (30 mg total) by mouth at bedtime.    Virtual Visit via telephone Note  I discussed the limitations, risks, security and privacy concerns of performing an evaluation and management service by telephone and the availability of in person appointments. The patient was identified with two identifiers. Pt.expressed understanding and agreed to proceed. Pt. Is at home. Dr. Livia Snellen is in his office.  Follow Up Instructions:   I discussed the assessment and treatment plan with the patient. The patient was provided an opportunity to ask questions and all were answered. The patient agreed with the plan and demonstrated an understanding of the instructions.   The patient was  advised to call back or seek an in-person evaluation if the symptoms worsen or if the condition fails to improve as anticipated.   Total minutes including chart review and phone contact time: 18   Follow up plan: Return in about 3 months (around 01/01/2019), or if symptoms worsen or fail to improve.  [name], MD [place]

## 2018-10-28 ENCOUNTER — Other Ambulatory Visit: Payer: Self-pay

## 2018-10-28 ENCOUNTER — Ambulatory Visit (INDEPENDENT_AMBULATORY_CARE_PROVIDER_SITE_OTHER): Payer: Medicare HMO | Admitting: Family Medicine

## 2018-10-28 ENCOUNTER — Encounter: Payer: Self-pay | Admitting: Family Medicine

## 2018-10-28 DIAGNOSIS — N921 Excessive and frequent menstruation with irregular cycle: Secondary | ICD-10-CM | POA: Diagnosis not present

## 2018-10-28 MED ORDER — MEDROXYPROGESTERONE ACETATE 10 MG PO TABS: 10.0000 mg | ORAL_TABLET | Freq: Every day | ORAL | 0 refills | Status: DC

## 2018-10-28 NOTE — Progress Notes (Signed)
Subjective:    Patient ID: Amber Williams, female    DOB: [birth year], 43 y.o.   MRN: [id number]   HPI: Amber Williams is a 43 y.o. female presenting for no menses for 6-7 years, until now having huge clumps for 3-4 days. Some as big around as a baseball. Painless.Copious and frequent. Changed her pad 4 times last night. Sexually active. Single partner. Denies likelihood of infidelity. No abdominal or pelvic pain. No fever, sweats. Denies any mucoid or clumpy DC or fish odor.   Depression screen [facility] 2/9 07/01/2018 06/18/2018 06/10/2018 05/22/2018 02/04/2018  Decreased Interest 1 2 3  0 0  Down, Depressed, Hopeless 1 2 2 1  0  PHQ - 2 Score 2 4 5 1  0  Altered sleeping 1 3 3  - -  Tired, decreased energy 1 2 3  - -  Change in appetite 1 2 3  - -  Feeling bad or failure about yourself  1 1 3  - -  Trouble concentrating 0 2 3 - -  Moving slowly or fidgety/restless 0 0 2 - -  Suicidal thoughts 1 0 1 - -  PHQ-9 Score 7 14 23  - -     Relevant past medical, surgical, family and social history reviewed and updated as indicated.  Interim medical history since our last visit reviewed. Allergies and medications reviewed and updated.  ROS:  Review of Systems  Constitutional: Negative.  Negative for fever.  HENT: Negative for congestion.   Eyes: Negative for visual disturbance.  Respiratory: Negative for shortness of breath.   Cardiovascular: Negative for chest pain.  Gastrointestinal: Negative for abdominal pain, constipation, diarrhea, nausea and vomiting.  Genitourinary: Positive for menstrual problem and vaginal bleeding. Negative for difficulty urinating, vaginal discharge and vaginal pain.  Musculoskeletal: Negative for arthralgias and myalgias.  Neurological: Negative for headaches.  Psychiatric/Behavioral: Negative for sleep disturbance.     Social History   Tobacco Use  Smoking Status Former Smoker  . Start date: 73  . Quit date: 01/30/2009  . Years since quitting: 9.7   Smokeless Tobacco Never Used       Objective:     Wt Readings from Last 3 Encounters:  07/10/18 183 lb (83 kg)  06/18/18 172 lb 8 oz (78.2 kg)  06/10/18 178 lb 6 oz (80.9 kg)     Exam deferred. Pt. Harboring due to COVID 19. Phone visit performed.   Assessment & Plan:   1. Metrorrhagia     Meds ordered this encounter  Medications  . medroxyPROGESTERone (PROVERA) 10 MG tablet    Sig: Take 1 tablet (10 mg total) by mouth daily. For 10 days.    Dispense:  10 tablet    Refill:  0    No orders of the defined types were placed in this encounter.     Diagnoses and all orders for this visit:  Metrorrhagia  Other orders -     medroxyPROGESTERone (PROVERA) 10 MG tablet; Take 1 tablet (10 mg total) by mouth daily. For 10 days.    Virtual Visit via telephone Note  I discussed the limitations, risks, security and privacy concerns of performing an evaluation and management service by telephone and the availability of in person appointments. The patient was identified with two identifiers. Pt.expressed understanding and agreed to proceed. Pt. Is at home. Dr. Livia Snellen is in his office.  Follow Up Instructions:   I discussed the assessment and treatment plan with the patient. The patient was provided an opportunity to ask questions  and all were answered. The patient agreed with the plan and demonstrated an understanding of the instructions.   The patient was advised to call back or seek an in-person evaluation if the symptoms worsen or if the condition fails to improve as anticipated.   Total minutes including chart review and phone contact time: 23   Follow up plan: Return in about 2 weeks (around 11/11/2018), or if symptoms worsen or fail to improve.  [name], MD [place]

## 2018-10-29 ENCOUNTER — Ambulatory Visit: Payer: Medicare HMO | Admitting: Family Medicine

## 2018-10-31 ENCOUNTER — Telehealth: Payer: Self-pay | Admitting: Neurology

## 2018-10-31 ENCOUNTER — Encounter: Payer: Self-pay | Admitting: Neurology

## 2018-10-31 NOTE — Telephone Encounter (Signed)
If patient calls back I actually realized she had 7/15 apt scheduled with Dr Jannifer Franklin and then also a Aug apt scheduled with Judson Roch. Please ask patient if she would like to just cancel the 7/15 apt. If she wants we can move her up sooner with Judson Roch. I have blocked 2:45 pm on 7/22 for the patient on sarah schedule or on 7/15 Sarah as of this moment has a 3:15 opening as well. I dont think patient would need both July and Aug apt since so close together

## 2018-10-31 NOTE — Telephone Encounter (Signed)
Called the patient to ask if she would be willing to change her apt to follow up with the NP for this visit. We have a opening on Sarah NP schedule for same day but at 2:45 pm with check in of 2:15. There was no answer. LVM for the pt to call back. If she is ok I have held that slot for her on sarah's schedule.

## 2018-11-05 ENCOUNTER — Other Ambulatory Visit: Payer: Self-pay | Admitting: Family Medicine

## 2018-11-13 ENCOUNTER — Ambulatory Visit: Payer: Medicare HMO | Admitting: Neurology

## 2018-11-13 ENCOUNTER — Encounter: Payer: Self-pay | Admitting: Neurology

## 2018-11-13 ENCOUNTER — Other Ambulatory Visit: Payer: Self-pay

## 2018-11-13 VITALS — BP 127/67 | HR 115 | Temp 98.2°F | Ht 65.0 in | Wt 193.3 lb

## 2018-11-13 DIAGNOSIS — N319 Neuromuscular dysfunction of bladder, unspecified: Secondary | ICD-10-CM

## 2018-11-13 DIAGNOSIS — Z5181 Encounter for therapeutic drug level monitoring: Secondary | ICD-10-CM | POA: Diagnosis not present

## 2018-11-13 DIAGNOSIS — G35 Multiple sclerosis: Secondary | ICD-10-CM

## 2018-11-13 NOTE — Progress Notes (Addendum)
Reason for visit: Multiple sclerosis, gait disorder  Amber Williams is an 43 y.o. female  History of present illness:  Amber Williams is a 43 year old right-handed white female with a history of multiple sclerosis associated with a gait disorder with right greater than left lower extremity weakness.  The patient had been seen on 10 July 2018 with some worsening weakness and increasing problems with walking.  The patient was given a course of prednisone, she was to be set up for physical therapy but this never occurred.  The patient has undergone MRI of the brain in April 2020, this showed no acute changes.  The patient has never completely returned to normal since that last attack, she got her last Ocrevus injection on 18 Sep 2018.  The patient usually feels better for several months after the injection, this did not happen with the last injection.  The patient is controlling the bladder fairly well, she has had some frequency of bowel movements, she tends to have a bowel movement about an hour after she eats.  She has had 1 or 2 falls since last seen, she did not sustain significant injury from this, she usually uses a walker or cane for ambulation, she does have a foot drop on the right and has an AFO brace.  Past Medical History:  Diagnosis Date  . Allergy   . Arthritis   . Blood transfusion without reported diagnosis    2013  . Depression   . DVT (deep venous thrombosis) ([place]) 2013  . Gait abnormality 08/03/2016  . Hyperlipidemia   . Hypertension   . Multiple sclerosis ([place])   . Neurogenic bladder 08/03/2016  . Neuromuscular disorder ([place])    dx'd with MS at age of 81  . PTSD (post-traumatic stress disorder)   . Right foot drop 08/03/2016  . Thyroid disease   . Ulcer     Past Surgical History:  Procedure Laterality Date  . COLON SURGERY     Colonoscopy  . GASTRIC BYPASS  2010  . INTERSTIM IMPLANT PLACEMENT  2014  . PORTACATH PLACEMENT      Family History  Problem  Relation Age of Onset  . Cancer Mother        Dx'd at age 79  . Breast cancer Mother   . Hypertension Father   . Diabetes Father   . Hypertension Sister   . Miscarriages / Stillbirths Sister   . Heart disease Maternal Grandmother   . Diabetes Maternal Grandmother   . Heart disease Maternal Grandfather   . Diabetes Paternal Grandmother   . Heart disease Paternal Grandfather   . Stroke Paternal Grandfather     Social history:  reports that she quit smoking about 9 years ago. She started smoking about 27 years ago. She has never used smokeless tobacco. She reports previous alcohol use. She reports that she does not use drugs.    Allergies  Allergen Reactions  . Other Hives    Plastic Tape  . Tecfidera [Dimethyl Fumarate] Itching    Medications:  Prior to Admission medications   Medication Sig Start Date End Date Taking? Authorizing Provider  ALLERGY RELIEF 180 MG tablet Take 180 mg by mouth as needed. 05/16/18  Yes [provider]  amitriptyline (ELAVIL) 50 MG tablet Take 1 tablet (50 mg total) by mouth at bedtime. (Please make 3 mos July appt) 11/06/18  Yes [name], [name], MD  Cholecalciferol (VITAMIN D3) 5000 units CAPS Take 5,000 Units by mouth every morning.  Yes [provider]  flurazepam (DALMANE) 30 MG capsule Take 1 capsule (30 mg total) by mouth at bedtime. 10/17/18  Yes [name], [name], MD  hydrocortisone (ANUSOL-HC) 2.5 % rectal cream Place 1 application rectally 2 (two) times daily. 10/01/18  Yes [name], [name], MD  levothyroxine (SYNTHROID, LEVOTHROID) 100 MCG tablet TAKE 1 TABLET DAILY 06/25/18  Yes [name], MD  mometasone (NASONEX) 50 MCG/ACT nasal spray  06/10/18  Yes [provider]  Ocrelizumab (OCREVUS IV) Inject into the vein.   Yes [provider]  omeprazole (PRILOSEC) 20 MG capsule TAKE 1 CAPSULE DAILY 09/12/18  Yes [name], [name], MD  vitamin B-12 (CYANOCOBALAMIN) 500 MCG tablet Take 500 mcg by mouth daily.   Yes [provider]    ROS:  Out of a complete 14 system review of symptoms, the patient complains only of the following symptoms, and all other reviewed systems are negative.  Walking problems Frequent bowel movements  Blood pressure 127/67, pulse (!) 115, temperature 98.2 F (36.8 C), temperature source Temporal, height 5\' 5"  (1.651 m), weight 193 lb 5 oz (87.7 kg).  Physical Exam  General: The patient is alert and cooperative at the time of the examination.  Skin: No significant peripheral edema is noted.   Neurologic Exam  Mental status: The patient is alert and oriented x 3 at the time of the examination. The patient has apparent normal recent and remote memory, with an apparently normal attention span and concentration ability.   Cranial nerves: Facial symmetry is present. Speech is normal, no aphasia or dysarthria is noted. Extraocular movements are full. Visual fields are full.  Pupils are equal, round, and reactive to light.  Discs are flat bilaterally.  Motor: The patient has good strength in the upper extremities.  With the lower extremities, the patient has 2/5 strength with hip flexion bilaterally and 3/5 strength with knee extension on the right and hamstring strength on the right, 4/5 on the left.  A significant foot drop is seen on the right, not present on the left.  Sensory examination: Soft touch sensation is symmetric on the face, arms, and legs.  Coordination: The patient has good finger-nose-finger bilaterally.  The patient is not able to perform heel-to-shin on either side.  Gait and station: The patient has a wide-based gait, circumduction gait with the right leg.  Patient usually uses a cane or a walker for ambulation.  Tandem gait was not attempted.  Romberg is negative but is unsteady.  Reflexes: Deep tendon reflexes are symmetric.   MRI brain 08/22/18:  IMPRESSION: Stable chronic multiple sclerosis. No evidence of acute plaque activity. No areas of  reduced diffusion or abnormal postcontrast enhancement.  * MRI scan images were reviewed online. I agree with the written report.    Assessment/Plan:  1.  Multiple sclerosis, relapsing remitting  2.  Gait disorder  The patient does not wish to enter into physical therapy currently.  She will continue the Ocrevus, blood work will be done today.  She will follow-up otherwise in 6 months.  Jill Alexanders MD 11/13/2018 2:01 PM  [facility] [address] [place], [place] [address]  Phone [phone] Fax [phone]

## 2018-11-16 LAB — CBC WITH DIFFERENTIAL/PLATELET
Basophils Absolute: 0 10*3/uL (ref 0.0–0.2)
Basos: 0 %
EOS (ABSOLUTE): 0.2 10*3/uL (ref 0.0–0.4)
Eos: 2 %
Hematocrit: 37 % (ref 34.0–46.6)
Hemoglobin: 11.5 g/dL (ref 11.1–15.9)
Immature Grans (Abs): 0 10*3/uL (ref 0.0–0.1)
Immature Granulocytes: 0 %
Lymphocytes Absolute: 1.4 10*3/uL (ref 0.7–3.1)
Lymphs: 17 %
MCH: 26.9 pg (ref 26.6–33.0)
MCHC: 31.1 g/dL — ABNORMAL LOW (ref 31.5–35.7)
MCV: 87 fL (ref 79–97)
Monocytes Absolute: 0.9 10*3/uL (ref 0.1–0.9)
Monocytes: 11 %
Neutrophils Absolute: 5.9 10*3/uL (ref 1.4–7.0)
Neutrophils: 70 %
Platelets: 412 10*3/uL (ref 150–450)
RBC: 4.27 x10E6/uL (ref 3.77–5.28)
RDW: 14.4 % (ref 11.7–15.4)
WBC: 8.4 10*3/uL (ref 3.4–10.8)

## 2018-11-16 LAB — COMPREHENSIVE METABOLIC PANEL
ALT: 13 IU/L (ref 0–32)
AST: 15 IU/L (ref 0–40)
Albumin/Globulin Ratio: 3.3 — ABNORMAL HIGH (ref 1.2–2.2)
Albumin: 4.3 g/dL (ref 3.8–4.8)
Alkaline Phosphatase: 103 IU/L (ref 39–117)
BUN/Creatinine Ratio: 10 (ref 9–23)
BUN: 9 mg/dL (ref 6–24)
Bilirubin Total: <0.2 mg/dL (ref 0.0–1.2)
CO2: 21 mmol/L (ref 20–29)
Calcium: 10 mg/dL (ref 8.7–10.2)
Chloride: 104 mmol/L (ref 96–106)
Creatinine, Ser: 0.87 mg/dL (ref 0.57–1.00)
GFR calc Af Amer: 95 mL/min/{1.73_m2} (ref >59)
GFR calc non Af Amer: 82 mL/min/{1.73_m2} (ref >59)
Globulin, Total: 1.3 g/dL — ABNORMAL LOW (ref 1.5–4.5)
Glucose: 70 mg/dL (ref 65–99)
Potassium: 4.2 mmol/L (ref 3.5–5.2)
Sodium: 139 mmol/L (ref 134–144)
Total Protein: 5.6 g/dL — ABNORMAL LOW (ref 6.0–8.5)

## 2018-11-16 LAB — CD19 AND CD20, FLOW CYTOMETRY
% CD19: NEGATIVE %
% CD20: NEGATIVE %

## 2018-11-18 ENCOUNTER — Other Ambulatory Visit: Payer: Self-pay | Admitting: *Deleted

## 2018-11-18 DIAGNOSIS — G35 Multiple sclerosis: Secondary | ICD-10-CM

## 2018-11-22 ENCOUNTER — Other Ambulatory Visit: Payer: Self-pay

## 2018-11-22 ENCOUNTER — Ambulatory Visit (INDEPENDENT_AMBULATORY_CARE_PROVIDER_SITE_OTHER): Payer: Medicare HMO | Admitting: *Deleted

## 2018-11-22 ENCOUNTER — Encounter: Payer: Self-pay | Admitting: *Deleted

## 2018-11-22 DIAGNOSIS — Z0000 Encounter for general adult medical examination without abnormal findings: Secondary | ICD-10-CM | POA: Diagnosis not present

## 2018-11-22 NOTE — Patient Instructions (Signed)

## 2018-11-22 NOTE — Progress Notes (Signed)
MEDICARE ANNUAL WELLNESS VISIT  11/22/2018  Telephone Visit Disclaimer This Medicare AWV was conducted by telephone due to national recommendations for restrictions regarding the COVID-19 Pandemic (e.g. social distancing).  I verified, using two identifiers, that I am speaking with Amber Williams or their authorized healthcare agent. I discussed the limitations, risks, security, and privacy concerns of performing an evaluation and management service by telephone and the potential availability of an in-person appointment in the future. The patient expressed understanding and agreed to proceed.   Subjective:  Amber Williams is a 43 y.o. female patient of [name], [name], MD who had a Medicare Annual Wellness Visit today via telephone. Amber Williams is Disabled and lives with their significant other [place]. she has 0 children. she reports that she is socially active and does interact with friends/family regularly. she is minimally physically active and enjoys playing games on her phone and hanging out with her friends and family.  Patient Care Team: [name], MD as PCP - General (Family Medicine) Kathrynn Ducking, MD as Consulting Physician (Neurology)  Advanced Directives 11/22/2018 08/16/2016 03/04/2016  Does Patient Have a Medical Advance Directive? No No No  Would patient like information on creating a medical advance directive? No - Patient declined No - Patient declined No - patient declined information    Hospital Utilization Over the Past 12 Months: # of hospitalizations or ER visits: 0 # of surgeries: 0  Review of Systems    Patient reports that her overall health is unchanged compared to last year.  Patient Reported Readings (BP, Pulse, CBG, Weight, etc) none  Review of Systems: No complaints  All other systems negative.  Pain Assessment Pain : No/denies pain     Current Medications & Allergies (verified) Allergies as of 11/22/2018      Reactions   Other Hives    Plastic Tape   Tecfidera [dimethyl Fumarate] Itching      Medication List       Accurate as of November 22, 2018  8:48 AM. If you have any questions, ask your nurse or doctor.        Allergy Relief 180 MG tablet Generic drug: fexofenadine Take 180 mg by mouth as needed.   amitriptyline 50 MG tablet Commonly known as: ELAVIL Take 1 tablet (50 mg total) by mouth at bedtime. (Please make 3 mos July appt)   flurazepam 30 MG capsule Commonly known as: DALMANE Take 1 capsule (30 mg total) by mouth at bedtime.   hydrocortisone 2.5 % rectal cream Commonly known as: ANUSOL-HC Place 1 application rectally 2 (two) times daily.   levothyroxine 100 MCG tablet Commonly known as: SYNTHROID TAKE 1 TABLET DAILY   mometasone 50 MCG/ACT nasal spray Commonly known as: NASONEX   OCREVUS IV Inject into the vein.   omeprazole 20 MG capsule Commonly known as: PRILOSEC TAKE 1 CAPSULE DAILY   vitamin B-12 500 MCG tablet Commonly known as: CYANOCOBALAMIN Take 500 mcg by mouth daily.   Vitamin D3 125 MCG (5000 UT) Caps Take 5,000 Units by mouth every morning.       History (reviewed): Past Medical History:  Diagnosis Date  . Allergy   . Arthritis   . Blood transfusion without reported diagnosis    2013  . Depression   . DVT (deep venous thrombosis) ([place]) 2013  . Gait abnormality 08/03/2016  . Hyperlipidemia   . Hypertension   . Multiple sclerosis ([place])   . Neurogenic bladder 08/03/2016  . Neuromuscular disorder ([place])  dx'd with MS at age of 76  . PTSD (post-traumatic stress disorder)   . Right foot drop 08/03/2016  . Thyroid disease   . Ulcer    Past Surgical History:  Procedure Laterality Date  . COLON SURGERY     Colonoscopy  . GASTRIC BYPASS  2010  . INTERSTIM IMPLANT PLACEMENT  2014  . PORTACATH PLACEMENT     Family History  Problem Relation Age of Onset  . Cancer Mother        Dx'd at age 24  . Breast cancer Mother   . Hypertension Father   . Diabetes Father    . Hypertension Sister   . Miscarriages / Stillbirths Sister   . Heart disease Maternal Grandmother   . Diabetes Maternal Grandmother   . Heart disease Maternal Grandfather   . Diabetes Paternal Grandmother   . Heart disease Paternal Grandfather   . Stroke Paternal Grandfather    Social History   Socioeconomic History  . Marital status: Significant Other    Spouse name: Audry Pili  . Number of children: 0  . Years of education: 55  . Highest education level: Associate degree: occupational, Hotel manager, or vocational program  Occupational History  . Occupation: Disability  Social Needs  . Financial resource strain: Not hard at all  . Food insecurity    Worry: Never true    Inability: Never true  . Transportation needs    Medical: No    Non-medical: No  Tobacco Use  . Smoking status: Former Smoker    Start date: 1993    Quit date: 01/30/2009    Years since quitting: 9.8  . Smokeless tobacco: Never Used  Substance and Sexual Activity  . 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 Drug use: No  . Sexual activity: Yes  Lifestyle  . Physical activity    Days per week: 2 days    Minutes per session: 30 min  . Stress: Not at all  Relationships  . Social connections    Talks on phone: More than three times a week    Gets together: More than three times a week    Attends religious service: Never    Active member of club or organization: No    Attends meetings of clubs or organizations: Never    Relationship status: Living with partner  Other Topics Concern  . Not on file  Social History Narrative   Lives at home with her boyfriend   Right-handed   Caffeine: 2 glasses per day    Activities of Daily Living In your present state of health, do you have any difficulty performing the following activities: 11/22/2018  Hearing? N  Vision? N  Difficulty concentrating or making decisions? N  Walking or climbing stairs? Y  Comment due to her MS  Dressing or bathing?  N  Doing errands, shopping? N  Preparing Food and eating ? N  Using the Toilet? N  In the past six months, have you accidently leaked urine? Y  Comment if she can't get to the bathroom in time  Do you have problems with loss of bowel control? N  Managing your Medications? N  Managing your Finances? N  Housekeeping or managing your Housekeeping? N  Some recent data might be hidden    Patient Education/ Literacy How often do you need to have someone help you when you read instructions, pamphlets, or other written materials from your doctor or pharmacy?: 1 - Never What  is the last grade level you completed in school?: Associates Degree in Office Administration  Exercise Current Exercise Habits: Home exercise routine, Type of exercise: Other - see comments(stationary bike), Time (Minutes): 30, Frequency (Times/Week): 2, Weekly Exercise (Minutes/Week): 60, Intensity: Mild, Exercise limited by: neurologic condition(s)  Diet Patient reports consuming 3 meals a day and 1 snack(s) a day Patient reports that her primary diet is: Regular Patient reports that she does have regular access to food.   Depression Screen PHQ 2/9 Scores 11/22/2018 07/01/2018 06/18/2018 06/10/2018 05/22/2018 02/04/2018 11/20/2017  PHQ - 2 Score 0 2 4 5 1  0 0  PHQ- 9 Score - 7 14 23  - - -     Fall Risk Fall Risk  11/22/2018 06/04/2018 02/04/2018 11/22/2017 11/20/2017  Falls in the past year? 1 0 No No No  Number falls in past yr: 1 0 - - -  Injury with Fall? 0 0 - - -  Risk Factor Category  - - - - -  Risk for fall due to : History of fall(s);Impaired balance/gait;Impaired mobility - - - -  Risk for fall due to: Comment - - - - -  Follow up - - - - -     Objective:  Amber Williams seemed alert and oriented and she participated appropriately during our telephone visit.  Blood Pressure Weight BMI  BP Readings from Last 3 Encounters:  11/13/18 127/67  07/10/18 111/70  07/01/18 101/63   Wt Readings from Last 3  Encounters:  11/13/18 193 lb 5 oz (87.7 kg)  07/10/18 183 lb (83 kg)  06/18/18 172 lb 8 oz (78.2 kg)   BMI Readings from Last 1 Encounters:  11/13/18 32.17 kg/m    *Unable to obtain current vital signs, weight, and BMI due to telephone visit type  Hearing/Vision  . Thelda did not seem to have difficulty with hearing/understanding during the telephone conversation . Reports that she has not had a formal eye exam by an eye care professional within the past year . Reports that she has not had a formal hearing evaluation within the past year *Unable to fully assess hearing and vision during telephone visit type  Cognitive Function: 6CIT Screen 11/22/2018  What Year? 0 points  What month? 0 points  What time? 0 points  Count back from 20 0 points  Months in reverse 0 points  Repeat phrase 2 points  Total Score 2   (Normal:0-7, Significant for Dysfunction: >8)  Normal Cognitive Function Screening: Yes   Immunization & Health Maintenance Record Immunization History  Administered Date(s) Administered  . Influenza,inj,Quad PF,6+ Mos 01/31/2016, 03/19/2017, 02/04/2018  . Influenza-Unspecified 05/11/2011  . Pneumococcal Conjugate-13 07/14/2016  . Td 09/21/2009  . Tdap 09/21/2009    Health Maintenance  Topic Date Due  . HIV Screening  01/13/1991  . INFLUENZA VACCINE  11/30/2018  . TETANUS/TDAP  09/22/2019  . PAP SMEAR-Modifier  01/16/2020       Assessment  This is a routine wellness examination for [employer]  Health Maintenance: Due or Overdue Health Maintenance Due  Topic Date Due  . HIV Screening  01/13/1991    Amber Williams does not need a referral for Community Assistance: Care Management:   no Social Work:    no Prescription Assistance:  no Nutrition/Diabetes Education:  no   Plan:  Personalized Goals Goals Addressed            This Visit's Progress   . DIET - INCREASE WATER INTAKE  Try to drink 6-8 glasses of water daily.       Personalized Health Maintenance & Screening Recommendations  Advanced directives: has NO advanced directive - not interested in additional information  Lung Cancer Screening Recommended: no (Low Dose CT Chest recommended if Age 70-80 years, 30 pack-year currently smoking OR have quit w/in past 15 years) Hepatitis C Screening recommended: no HIV Screening recommended: yes  Advanced Directives: Written information was not prepared per patient's request.  Referrals & Orders No orders of the defined types were placed in this encounter.   Follow-up Plan . Follow-up with [name], MD as planned   I have personally reviewed and noted the following in the patient's chart:   . Medical and social history . Use of alcohol, tobacco or illicit drugs  . Current medications and supplements . Functional ability and status . Nutritional status . Physical activity . Advanced directives . List of other physicians . Hospitalizations, surgeries, and ER visits in previous 12 months . Vitals . Screenings to include cognitive, depression, and falls . Referrals and appointments  In addition, I have reviewed and discussed with Amber Williams certain preventive protocols, quality metrics, and best practice recommendations. A written personalized care plan for preventive services as well as general preventive health recommendations is available and can be mailed to the patient at her request.      Marylin Crosby, LPN  5/83/0940

## 2018-11-30 ENCOUNTER — Other Ambulatory Visit: Payer: Self-pay | Admitting: Family Medicine

## 2018-12-03 ENCOUNTER — Other Ambulatory Visit: Payer: Self-pay | Admitting: Family Medicine

## 2018-12-03 ENCOUNTER — Ambulatory Visit: Payer: Medicare HMO | Admitting: Neurology

## 2018-12-03 ENCOUNTER — Other Ambulatory Visit: Payer: Self-pay

## 2018-12-04 ENCOUNTER — Encounter: Payer: Self-pay | Admitting: Family Medicine

## 2018-12-04 ENCOUNTER — Ambulatory Visit (INDEPENDENT_AMBULATORY_CARE_PROVIDER_SITE_OTHER): Payer: Medicare HMO | Admitting: Family Medicine

## 2018-12-04 VITALS — BP 119/80 | HR 105 | Temp 98.4°F | Ht 65.0 in | Wt 194.0 lb

## 2018-12-04 DIAGNOSIS — K648 Other hemorrhoids: Secondary | ICD-10-CM

## 2018-12-04 DIAGNOSIS — F5101 Primary insomnia: Secondary | ICD-10-CM

## 2018-12-04 DIAGNOSIS — E039 Hypothyroidism, unspecified: Secondary | ICD-10-CM | POA: Diagnosis not present

## 2018-12-04 DIAGNOSIS — G35 Multiple sclerosis: Secondary | ICD-10-CM

## 2018-12-04 MED ORDER — LEVOTHYROXINE SODIUM 100 MCG PO TABS: 100.0000 ug | ORAL_TABLET | Freq: Every day | ORAL | 1 refills | Status: DC

## 2018-12-04 MED ORDER — OMEPRAZOLE 20 MG PO CPDR: 20.0000 mg | DELAYED_RELEASE_CAPSULE | Freq: Every day | ORAL | 1 refills | Status: DC

## 2018-12-04 MED ORDER — FLURAZEPAM HCL 30 MG PO CAPS: 30.0000 mg | ORAL_CAPSULE | Freq: Every day | ORAL | 2 refills | Status: DC

## 2018-12-04 MED ORDER — LEVONORGESTREL-ETHINYL ESTRAD 0.15-30 MG-MCG PO TABS: 1.0000 | ORAL_TABLET | Freq: Every day | ORAL | 11 refills | Status: DC

## 2018-12-04 MED ORDER — AMITRIPTYLINE HCL 50 MG PO TABS: 50.0000 mg | ORAL_TABLET | Freq: Every day | ORAL | 5 refills | Status: DC

## 2018-12-04 NOTE — Progress Notes (Addendum)
Subjective:  Patient ID: Amber Williams, female    DOB: [birth year]  Age: 43 y.o. MRN: [id number]  CC: Medical Management of Chronic Issues   HPI Amber Williams presents for patient presents for follow-up on  thyroid. The patient has a history of hypothyroidism for many years. It has been stable recently. Pt. denies any change in  voice, she does report loss of clumps  of hair, but no heat or cold intolerance. Energy level has been rather low.  She has not bounced back with her most recent shot of Ocrevus. Patient denies constipation and diarrhea.  Stools are loose at times.  No myxedema. Medication is as noted below. Verified that pt is taking it daily on an empty stomach. Well tolerated.  Patient states that she had a period for couple days after discontinuing the medroxyprogesterone bolus.  A couple of weeks later she had a regular menses.  That started 3 weeks ago and lasted 1 week.  She is concerned about possibility of pregnancy.  Additionally patient reports that she is having some problems with itching burning and bleeding from her hemorrhoids again.  Her stools are loose.  She is not having any problems with hard stool or constipation.  This is in spite of treatment just 2 months ago.  Depression screen [facility] 2/9 12/04/2018 11/22/2018 07/01/2018  Decreased Interest 0 0 1  Down, Depressed, Hopeless 0 0 1  PHQ - 2 Score 0 0 2  Altered sleeping - - 1  Tired, decreased energy - - 1  Change in appetite - - 1  Feeling bad or failure about yourself  - - 1  Trouble concentrating - - 0  Moving slowly or fidgety/restless - - 0  Suicidal thoughts - - 1  PHQ-9 Score - - 7    History Amber Williams has a past medical history of Allergy, Arthritis, Blood transfusion without reported diagnosis, Depression, DVT (deep venous thrombosis) ([place]) (2013), Gait abnormality (08/03/2016), Hyperlipidemia, Hypertension, Multiple sclerosis ([place]), Neurogenic bladder (08/03/2016), Neuromuscular disorder ([place]), PTSD  (post-traumatic stress disorder), Right foot drop (08/03/2016), Thyroid disease, and Ulcer.   She has a past surgical history that includes Colon surgery; Gastric bypass (2010); Interstim Implant placement (2014); and Portacath placement.   Her family history includes Breast cancer in her mother; Cancer in her mother; Diabetes in her father, maternal grandmother, and paternal grandmother; Heart disease in her maternal grandfather, maternal grandmother, and paternal grandfather; Hypertension in her father and sister; Miscarriages / [name] in her sister; Stroke in her paternal grandfather.She reports that she quit smoking about 9 years ago. She started smoking about 27 years ago. She has never used smokeless tobacco. She reports previous alcohol use. She reports that she does not use drugs.    ROS Review of Systems  Constitutional: Positive for fatigue.  HENT: Negative for congestion.   Eyes: Negative for visual disturbance.  Respiratory: Negative for shortness of breath.   Cardiovascular: Negative for chest pain.  Gastrointestinal: Positive for anal bleeding and rectal pain. Negative for abdominal pain, constipation, diarrhea, nausea and vomiting.  Genitourinary: Negative for difficulty urinating.  Musculoskeletal: Positive for gait problem. Negative for arthralgias and myalgias.  Neurological: Negative for headaches.  Psychiatric/Behavioral: Negative for sleep disturbance.    Objective:  BP 119/80    Pulse (!) 105    Temp 98.4 F (36.9 C) (Oral)    Ht 5' 5"  (1.651 m)    Wt 194 lb (88 kg)    BMI 32.28 kg/m   BP  Readings from Last 3 Encounters:  12/04/18 119/80  11/13/18 127/67  07/10/18 111/70    Wt Readings from Last 3 Encounters:  12/04/18 194 lb (88 kg)  11/13/18 193 lb 5 oz (87.7 kg)  07/10/18 183 lb (83 kg)     Physical Exam Constitutional:      General: She is not in acute distress.    Appearance: She is well-developed.  HENT:     Head: Normocephalic and  atraumatic.  Eyes:     Conjunctiva/sclera: Conjunctivae normal.     Pupils: Pupils are equal, round, and reactive to light.  Neck:     Musculoskeletal: Normal range of motion and neck supple.     Thyroid: No thyromegaly.  Cardiovascular:     Rate and Rhythm: Normal rate and regular rhythm.     Heart sounds: Normal heart sounds. No murmur.  Pulmonary:     Effort: Pulmonary effort is normal. No respiratory distress.     Breath sounds: Normal breath sounds. No wheezing or rales.  Abdominal:     General: Bowel sounds are normal. There is no distension.     Palpations: Abdomen is soft.     Tenderness: There is no abdominal tenderness.  Musculoskeletal:        General: No swelling.  Lymphadenopathy:     Cervical: No cervical adenopathy.  Skin:    General: Skin is warm and dry.  Neurological:     General: No focal deficit present.     Mental Status: She is alert and oriented to person, place, and time.     Motor: Weakness present.     Gait: Gait abnormal (typical for MS).  Psychiatric:        Behavior: Behavior normal.        Thought Content: Thought content normal.        Judgment: Judgment normal.       Assessment & Plan:   Amber Williams was seen today for medical management of chronic issues.  Diagnoses and all orders for this visit:  Hypothyroidism, unspecified type -     TSH -     T4, Free  Prolapsed internal hemorrhoids -     Ambulatory referral to Colorectal Surgery  Multiple sclerosis ([place]) -     CBC with Differential/Platelet -     CMP14+EGFR -     Vitamin B12  Primary insomnia  Other orders -     levonorgestrel-ethinyl estradiol (NORDETTE) 0.15-30 MG-MCG tablet; Take 1 tablet by mouth daily. -     omeprazole (PRILOSEC) 20 MG capsule; Take 1 capsule (20 mg total) by mouth daily. -     levothyroxine (SYNTHROID) 100 MCG tablet; Take 1 tablet (100 mcg total) by mouth daily. -     amitriptyline (ELAVIL) 50 MG tablet; Take 1 tablet (50 mg total) by mouth at  bedtime. -     flurazepam (DALMANE) 30 MG capsule; Take 1 capsule (30 mg total) by mouth at bedtime.       I have changed Amber Williams's omeprazole, levothyroxine, and amitriptyline. I am also having her start on levonorgestrel-ethinyl estradiol. Additionally, I am having her maintain her Vitamin D3, Ocrelizumab (OCREVUS IV), vitamin B-12, Allergy Relief, mometasone, hydrocortisone, and flurazepam.  Allergies as of 12/04/2018      Reactions   Other Hives   Plastic Tape   Tecfidera [dimethyl Fumarate] Itching      Medication List       Accurate as of December 04, 2018  3:03 PM. If you  have any questions, ask your nurse or doctor.        Allergy Relief 180 MG tablet Generic drug: fexofenadine Take 180 mg by mouth as needed.   amitriptyline 50 MG tablet Commonly known as: ELAVIL Take 1 tablet (50 mg total) by mouth at bedtime. What changed: additional instructions Changed by: [name], MD   flurazepam 30 MG capsule Commonly known as: DALMANE Take 1 capsule (30 mg total) by mouth at bedtime.   hydrocortisone 2.5 % rectal cream Commonly known as: ANUSOL-HC Place 1 application rectally 2 (two) times daily.   levonorgestrel-ethinyl estradiol 0.15-30 MG-MCG tablet Commonly known as: NORDETTE Take 1 tablet by mouth daily. Started by: [name], MD   levothyroxine 100 MCG tablet Commonly known as: SYNTHROID Take 1 tablet (100 mcg total) by mouth daily.   mometasone 50 MCG/ACT nasal spray Commonly known as: NASONEX   OCREVUS IV Inject into the vein.   omeprazole 20 MG capsule Commonly known as: PRILOSEC Take 1 capsule (20 mg total) by mouth daily.   vitamin B-12 500 MCG tablet Commonly known as: CYANOCOBALAMIN Take 500 mcg by mouth daily.   Vitamin D3 125 MCG (5000 UT) Caps Take 5,000 Units by mouth every morning.        Follow-up: Return in about 3 months (around 03/06/2019).  [name], M.D.

## 2018-12-04 NOTE — Addendum Note (Signed)
Addended by: Rolena Infante on: 12/04/2018 02:37 PM   Modules accepted: Orders

## 2018-12-04 NOTE — Addendum Note (Signed)
Addended by: [name] on: 12/04/2018 03:03 PM   Modules accepted: Orders

## 2018-12-05 LAB — CBC WITH DIFFERENTIAL/PLATELET
Basophils Absolute: 0.1 10*3/uL (ref 0.0–0.2)
Basos: 1 %
EOS (ABSOLUTE): 0.1 10*3/uL (ref 0.0–0.4)
Eos: 1 %
Hematocrit: 34.4 % (ref 34.0–46.6)
Hemoglobin: 10.2 g/dL — ABNORMAL LOW (ref 11.1–15.9)
Immature Grans (Abs): 0 10*3/uL (ref 0.0–0.1)
Immature Granulocytes: 0 %
Lymphocytes Absolute: 1.4 10*3/uL (ref 0.7–3.1)
Lymphs: 13 %
MCH: 25.6 pg — ABNORMAL LOW (ref 26.6–33.0)
MCHC: 29.7 g/dL — ABNORMAL LOW (ref 31.5–35.7)
MCV: 86 fL (ref 79–97)
Monocytes Absolute: 1 10*3/uL — ABNORMAL HIGH (ref 0.1–0.9)
Monocytes: 9 %
Neutrophils Absolute: 8.2 10*3/uL — ABNORMAL HIGH (ref 1.4–7.0)
Neutrophils: 76 %
Platelets: 453 10*3/uL — ABNORMAL HIGH (ref 150–450)
RBC: 3.99 x10E6/uL (ref 3.77–5.28)
RDW: 14.3 % (ref 11.7–15.4)
WBC: 10.8 10*3/uL (ref 3.4–10.8)

## 2018-12-05 LAB — CMP14+EGFR
ALT: 11 IU/L (ref 0–32)
AST: 14 IU/L (ref 0–40)
Albumin/Globulin Ratio: 2.9 — ABNORMAL HIGH (ref 1.2–2.2)
Albumin: 3.8 g/dL (ref 3.8–4.8)
Alkaline Phosphatase: 94 IU/L (ref 39–117)
BUN/Creatinine Ratio: 11 (ref 9–23)
BUN: 8 mg/dL (ref 6–24)
Bilirubin Total: <0.2 mg/dL (ref 0.0–1.2)
CO2: 23 mmol/L (ref 20–29)
Calcium: 9.3 mg/dL (ref 8.7–10.2)
Chloride: 105 mmol/L (ref 96–106)
Creatinine, Ser: 0.76 mg/dL (ref 0.57–1.00)
GFR calc Af Amer: 112 mL/min/{1.73_m2} (ref >59)
GFR calc non Af Amer: 97 mL/min/{1.73_m2} (ref >59)
Globulin, Total: 1.3 g/dL — ABNORMAL LOW (ref 1.5–4.5)
Glucose: 66 mg/dL (ref 65–99)
Potassium: 4.2 mmol/L (ref 3.5–5.2)
Sodium: 140 mmol/L (ref 134–144)
Total Protein: 5.1 g/dL — ABNORMAL LOW (ref 6.0–8.5)

## 2018-12-05 LAB — VITAMIN B12: Vitamin B-12: >2000 pg/mL — ABNORMAL HIGH (ref 232–1245)

## 2018-12-05 LAB — T4, FREE: Free T4: 1.21 ng/dL (ref 0.82–1.77)

## 2018-12-05 LAB — TSH: TSH: 1.86 u[IU]/mL (ref 0.450–4.500)

## 2018-12-07 LAB — FERRITIN: Ferritin: 5 ng/mL — ABNORMAL LOW (ref 15–150)

## 2018-12-07 LAB — IRON AND TIBC
Iron Saturation: 5 % — CL (ref 15–55)
Iron: 17 ug/dL — ABNORMAL LOW (ref 27–159)
Total Iron Binding Capacity: 371 ug/dL (ref 250–450)
UIBC: 354 ug/dL (ref 131–425)

## 2018-12-07 LAB — SPECIMEN STATUS REPORT

## 2018-12-17 ENCOUNTER — Other Ambulatory Visit: Payer: Self-pay

## 2018-12-17 ENCOUNTER — Encounter: Payer: Self-pay | Admitting: General Surgery

## 2018-12-17 ENCOUNTER — Ambulatory Visit: Payer: Medicare HMO | Admitting: General Surgery

## 2018-12-17 VITALS — BP 136/81 | HR 94 | Temp 97.8°F | Resp 16 | Ht 65.0 in | Wt 193.0 lb

## 2018-12-17 DIAGNOSIS — K644 Residual hemorrhoidal skin tags: Secondary | ICD-10-CM

## 2018-12-17 DIAGNOSIS — A630 Anogenital (venereal) warts: Secondary | ICD-10-CM

## 2018-12-17 DIAGNOSIS — K648 Other hemorrhoids: Secondary | ICD-10-CM

## 2018-12-17 NOTE — Progress Notes (Signed)
Amber Williams; [id number]; [birth year]   HPI Patient is a 43 year old white female who was referred to my care by Dr. Livia Snellen for evaluation treatment of hemorrhoidal disease.  Patient states she has had problems with her hemorrhoids for many years.  She states that sometimes they prolapse out.  She has [place] a colonoscopy in another state within the last 3 to 4 years.  She was told she had no polyps.  Due to her MS, she does have intermittent loose bowel movements.  She denies any rectal pain.  She has 0 out of 10 pain.  She does notice blood on the toilet paper when she wipes herself.  Denies any significant gynecological issue. Past Medical History:  Diagnosis Date  . Allergy   . Arthritis   . Blood transfusion without reported diagnosis    2013  . Depression   . DVT (deep venous thrombosis) ([place]) 2013  . Gait abnormality 08/03/2016  . Hyperlipidemia   . Hypertension   . Multiple sclerosis ([place])   . Neurogenic bladder 08/03/2016  . Neuromuscular disorder ([place])    dx'd with MS at age of 46  . PTSD (post-traumatic stress disorder)   . Right foot drop 08/03/2016  . Thyroid disease   . Ulcer     Past Surgical History:  Procedure Laterality Date  . COLON SURGERY     Colonoscopy  . GASTRIC BYPASS  2010  . INTERSTIM IMPLANT PLACEMENT  2014  . PORTACATH PLACEMENT      Family History  Problem Relation Age of Onset  . Cancer Mother        Dx'd at age 50  . Breast cancer Mother   . Hypertension Father   . Diabetes Father   . Hypertension Sister   . Miscarriages / Stillbirths Sister   . Heart disease Maternal Grandmother   . Diabetes Maternal Grandmother   . Heart disease Maternal Grandfather   . Diabetes Paternal Grandmother   . Heart disease Paternal Grandfather   . Stroke Paternal Grandfather     Current Outpatient Medications on File Prior to Visit  Medication Sig Dispense Refill  . ALLERGY RELIEF 180 MG tablet Take 180 mg by mouth as needed.    [name] amitriptyline (ELAVIL) 50  MG tablet Take 1 tablet (50 mg total) by mouth at bedtime. 30 tablet 5  . Cholecalciferol (VITAMIN D3) 5000 units CAPS Take 5,000 Units by mouth every morning.    . ferrous fumarate (HEMOCYTE - 106 MG FE) 325 (106 Fe) MG TABS tablet 325 mg 2 (two) times daily.    . flurazepam (DALMANE) 30 MG capsule Take 1 capsule (30 mg total) by mouth at bedtime. 30 capsule 2  . hydrocortisone (ANUSOL-HC) 2.5 % rectal cream Place 1 application rectally 2 (two) times daily. 28 g 5  . levonorgestrel-ethinyl estradiol (NORDETTE) 0.15-30 MG-MCG tablet Take 1 tablet by mouth daily. 1 Package 11  . levothyroxine (SYNTHROID) 100 MCG tablet Take 1 tablet (100 mcg total) by mouth daily. 90 tablet 1  . loperamide (IMODIUM) 1 MG/5ML solution Take by mouth as needed for diarrhea or loose stools.    . mometasone (NASONEX) 50 MCG/ACT nasal spray     . Ocrelizumab (OCREVUS IV) Inject into the vein.    [name] omeprazole (PRILOSEC) 20 MG capsule Take 1 capsule (20 mg total) by mouth daily. 90 capsule 1  . vitamin B-12 (CYANOCOBALAMIN) 500 MCG tablet Take 500 mcg by mouth daily.     No current facility-administered medications on file  prior to visit.     Allergies  Allergen Reactions  . Other Hives    Plastic Tape  . 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 Start date: 43  . Quit date: 01/30/2009  . Years since quitting: 9.8  Smokeless Tobacco Never Used    Review of Systems  Constitutional: Positive for chills and malaise/fatigue.  HENT: Negative.   Eyes: Negative.   Respiratory: Negative.   Cardiovascular: Negative.   Gastrointestinal: Negative.   Genitourinary: Positive for frequency and urgency.  Musculoskeletal: Positive for back pain.  Skin: Negative.   Neurological: Positive for sensory change.  Endo/Heme/Allergies: Negative.    Psychiatric/Behavioral: Negative.     Objective   Vitals:   12/17/18 0855  BP: 136/81  Pulse: 94  Resp: 16  Temp: 97.8 F (36.6 C)  SpO2: 97%    Physical Exam Vitals signs reviewed.  Constitutional:      Appearance: Normal appearance. She is not ill-appearing.  HENT:     Head: Normocephalic and atraumatic.  Cardiovascular:     Rate and Rhythm: Normal rate and regular rhythm.     Heart sounds: Normal heart sounds. No murmur. No friction rub. No gallop.   Pulmonary:     Effort: Pulmonary effort is normal. No respiratory distress.     Breath sounds: Normal breath sounds. No stridor. No wheezing, rhonchi or rales.  Genitourinary:    Comments: Rectal examination reveals external hemorrhoidal skin tags with evidence of possible condyloma involvement around superior aspect of anus.  Mild internal hemorrhoidal disease noted.  No active bleeding noted.  Sphincter tone is fair. Skin:    General: Skin is warm and dry.  Neurological:     Mental Status: She is alert and oriented to person, place, and time.     Gait: Gait abnormal.     Comments: Ambulates with cane.     Assessment  Internal and external hemorrhoidal disease, condyloma acuminata Plan   Patient is scheduled for an extensive hemorrhoidectomy on 12/25/2018.  The risks and benefits of the procedure including bleeding, infection, and recurrence of the hemorrhoids were fully explained to the patient, he gave informed consent.  I did tell her that there were condyloma present and these will be addressed.

## 2018-12-17 NOTE — H&P (Signed)
Amber Williams; [id number]; Oct 22, 1975   HPI Patient is a 43 year old white female who was referred to my care by Dr. Livia Snellen for evaluation treatment of hemorrhoidal disease.  Patient states she has had problems with her hemorrhoids for many years.  She states that sometimes they prolapse out.  She has had a colonoscopy in another state within the last 3 to 4 years.  She was told she had no polyps.  Due to her MS, she does have intermittent loose bowel movements.  She denies any rectal pain.  She has 0 out of 10 pain.  She does notice blood on the toilet paper when she wipes herself.  Denies any significant gynecological issue. Past Medical History:  Diagnosis Date  . Allergy   . Arthritis   . Blood transfusion without reported diagnosis    2013  . Depression   . DVT (deep venous thrombosis) ([place]) 2013  . Gait abnormality 08/03/2016  . Hyperlipidemia   . Hypertension   . Multiple sclerosis ([place])   . Neurogenic bladder 08/03/2016  . Neuromuscular disorder ([place])    dx'd with MS at age of 41  . PTSD (post-traumatic stress disorder)   . Right foot drop 08/03/2016  . Thyroid disease   . Ulcer     Past Surgical History:  Procedure Laterality Date  . COLON SURGERY     Colonoscopy  . GASTRIC BYPASS  2010  . INTERSTIM IMPLANT PLACEMENT  2014  . PORTACATH PLACEMENT      Family History  Problem Relation Age of Onset  . Cancer Mother        Dx'd at age 2  . Breast cancer Mother   . Hypertension Father   . Diabetes Father   . Hypertension Sister   . Miscarriages / Stillbirths Sister   . Heart disease Maternal Grandmother   . Diabetes Maternal Grandmother   . Heart disease Maternal Grandfather   . Diabetes Paternal Grandmother   . Heart disease Paternal Grandfather   . Stroke Paternal Grandfather     Current Outpatient Medications on File Prior to Visit  Medication Sig Dispense Refill  . ALLERGY RELIEF 180 MG tablet Take 180 mg by mouth as needed.    [name] amitriptyline (ELAVIL) 50  MG tablet Take 1 tablet (50 mg total) by mouth at bedtime. 30 tablet 5  . Cholecalciferol (VITAMIN D3) 5000 units CAPS Take 5,000 Units by mouth every morning.    . ferrous fumarate (HEMOCYTE - 106 MG FE) 325 (106 Fe) MG TABS tablet 325 mg 2 (two) times daily.    . flurazepam (DALMANE) 30 MG capsule Take 1 capsule (30 mg total) by mouth at bedtime. 30 capsule 2  . hydrocortisone (ANUSOL-HC) 2.5 % rectal cream Place 1 application rectally 2 (two) times daily. 28 g 5  . levonorgestrel-ethinyl estradiol (NORDETTE) 0.15-30 MG-MCG tablet Take 1 tablet by mouth daily. 1 Package 11  . levothyroxine (SYNTHROID) 100 MCG tablet Take 1 tablet (100 mcg total) by mouth daily. 90 tablet 1  . loperamide (IMODIUM) 1 MG/5ML solution Take by mouth as needed for diarrhea or loose stools.    . mometasone (NASONEX) 50 MCG/ACT nasal spray     . Ocrelizumab (OCREVUS IV) Inject into the vein.    [name] omeprazole (PRILOSEC) 20 MG capsule Take 1 capsule (20 mg total) by mouth daily. 90 capsule 1  . vitamin B-12 (CYANOCOBALAMIN) 500 MCG tablet Take 500 mcg by mouth daily.     No current facility-administered medications on file  prior to visit.     Allergies  Allergen Reactions  . Other Hives    Plastic Tape  . 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 Start date: 54  . Quit date: 01/30/2009  . Years since quitting: 9.8  Smokeless Tobacco Never Used    Review of Systems  Constitutional: Positive for chills and malaise/fatigue.  HENT: Negative.   Eyes: Negative.   Respiratory: Negative.   Cardiovascular: Negative.   Gastrointestinal: Negative.   Genitourinary: Positive for frequency and urgency.  Musculoskeletal: Positive for back pain.  Skin: Negative.   Neurological: Positive for sensory change.  Endo/Heme/Allergies: Negative.    Psychiatric/Behavioral: Negative.     Objective   Vitals:   12/17/18 0855  BP: 136/81  Pulse: 94  Resp: 16  Temp: 97.8 F (36.6 C)  SpO2: 97%    Physical Exam Vitals signs reviewed.  Constitutional:      Appearance: Normal appearance. She is not ill-appearing.  HENT:     Head: Normocephalic and atraumatic.  Cardiovascular:     Rate and Rhythm: Normal rate and regular rhythm.     Heart sounds: Normal heart sounds. No murmur. No friction rub. No gallop.   Pulmonary:     Effort: Pulmonary effort is normal. No respiratory distress.     Breath sounds: Normal breath sounds. No stridor. No wheezing, rhonchi or rales.  Genitourinary:    Comments: Rectal examination reveals external hemorrhoidal skin tags with evidence of possible condyloma involvement around superior aspect of anus.  Mild internal hemorrhoidal disease noted.  No active bleeding noted.  Sphincter tone is fair. Skin:    General: Skin is warm and dry.  Neurological:     Mental Status: She is alert and oriented to person, place, and time.     Gait: Gait abnormal.     Comments: Ambulates with cane.     Assessment  Internal and external hemorrhoidal disease, condyloma acuminata Plan   Patient is scheduled for an extensive hemorrhoidectomy on 12/25/2018.  The risks and benefits of the procedure including bleeding, infection, and recurrence of the hemorrhoids were fully explained to the patient, he gave informed consent.  I did tell her that there were condyloma present and these will be addressed.

## 2018-12-18 ENCOUNTER — Other Ambulatory Visit: Payer: Self-pay

## 2018-12-18 ENCOUNTER — Encounter (HOSPITAL_COMMUNITY): Payer: Self-pay

## 2018-12-19 ENCOUNTER — Encounter (HOSPITAL_COMMUNITY)
Admission: RE | Admit: 2018-12-19 | Discharge: 2018-12-19 | Disposition: A | Discharge status: Home / Self Care | Payer: Medicare HMO | Source: Ambulatory Visit | Attending: General Surgery | Admitting: General Surgery

## 2018-12-19 HISTORY — DX: Hypothyroidism, unspecified: E03.9

## 2018-12-24 ENCOUNTER — Other Ambulatory Visit: Payer: Self-pay

## 2018-12-24 ENCOUNTER — Other Ambulatory Visit (HOSPITAL_COMMUNITY)
Admission: RE | Admit: 2018-12-24 | Discharge: 2018-12-24 | Disposition: A | Discharge status: Home / Self Care | Payer: Medicare HMO | Source: Ambulatory Visit | Attending: General Surgery | Admitting: General Surgery

## 2018-12-24 DIAGNOSIS — K644 Residual hemorrhoidal skin tags: Secondary | ICD-10-CM | POA: Diagnosis not present

## 2018-12-24 DIAGNOSIS — Z20828 Contact with and (suspected) exposure to other viral communicable diseases: Secondary | ICD-10-CM | POA: Diagnosis not present

## 2018-12-24 DIAGNOSIS — Z01812 Encounter for preprocedural laboratory examination: Secondary | ICD-10-CM | POA: Insufficient documentation

## 2018-12-24 DIAGNOSIS — K648 Other hemorrhoids: Secondary | ICD-10-CM | POA: Insufficient documentation

## 2018-12-24 LAB — SARS CORONAVIRUS 2 (TAT 6-24 HRS): SARS Coronavirus 2: NEGATIVE

## 2018-12-25 ENCOUNTER — Other Ambulatory Visit: Payer: Self-pay

## 2018-12-25 ENCOUNTER — Ambulatory Visit (HOSPITAL_COMMUNITY)
Admission: RE | Admit: 2018-12-25 | Discharge: 2018-12-25 | Disposition: A | Discharge status: Home / Self Care | Payer: Medicare HMO | Attending: General Surgery | Admitting: General Surgery

## 2018-12-25 ENCOUNTER — Encounter (HOSPITAL_COMMUNITY): Payer: Self-pay | Admitting: *Deleted

## 2018-12-25 ENCOUNTER — Ambulatory Visit (HOSPITAL_COMMUNITY): Payer: Medicare HMO | Admitting: Anesthesiology

## 2018-12-25 ENCOUNTER — Encounter (HOSPITAL_COMMUNITY)
Admission: RE | Disposition: A | Discharge status: Home / Self Care | Payer: Self-pay | Source: Home / Self Care | Attending: General Surgery

## 2018-12-25 DIAGNOSIS — F329 Major depressive disorder, single episode, unspecified: Secondary | ICD-10-CM | POA: Diagnosis not present

## 2018-12-25 DIAGNOSIS — K644 Residual hemorrhoidal skin tags: Secondary | ICD-10-CM | POA: Insufficient documentation

## 2018-12-25 DIAGNOSIS — Z9884 Bariatric surgery status: Secondary | ICD-10-CM | POA: Diagnosis not present

## 2018-12-25 DIAGNOSIS — Z86718 Personal history of other venous thrombosis and embolism: Secondary | ICD-10-CM | POA: Insufficient documentation

## 2018-12-25 DIAGNOSIS — Z79890 Hormone replacement therapy (postmenopausal): Secondary | ICD-10-CM | POA: Insufficient documentation

## 2018-12-25 DIAGNOSIS — Z793 Long term (current) use of hormonal contraceptives: Secondary | ICD-10-CM | POA: Insufficient documentation

## 2018-12-25 DIAGNOSIS — A630 Anogenital (venereal) warts: Secondary | ICD-10-CM | POA: Diagnosis not present

## 2018-12-25 DIAGNOSIS — K649 Unspecified hemorrhoids: Secondary | ICD-10-CM | POA: Insufficient documentation

## 2018-12-25 DIAGNOSIS — G35 Multiple sclerosis: Secondary | ICD-10-CM | POA: Diagnosis not present

## 2018-12-25 DIAGNOSIS — C210 Malignant neoplasm of anus, unspecified: Secondary | ICD-10-CM | POA: Diagnosis not present

## 2018-12-25 DIAGNOSIS — Z79899 Other long term (current) drug therapy: Secondary | ICD-10-CM | POA: Insufficient documentation

## 2018-12-25 DIAGNOSIS — E039 Hypothyroidism, unspecified: Secondary | ICD-10-CM | POA: Diagnosis not present

## 2018-12-25 DIAGNOSIS — I10 Essential (primary) hypertension: Secondary | ICD-10-CM | POA: Insufficient documentation

## 2018-12-25 HISTORY — PX: HEMORRHOID SURGERY: SHX153

## 2018-12-25 LAB — HCG, SERUM, QUALITATIVE: Preg, Serum: NEGATIVE

## 2018-12-25 SURGERY — HEMORRHOIDECTOMY
Anesthesia: General | Site: Rectum

## 2018-12-25 MED ORDER — HYDROCODONE-ACETAMINOPHEN 5-325 MG PO TABS: 1.0000 | ORAL_TABLET | Freq: Four times a day (QID) | ORAL | 0 refills | Status: DC | PRN

## 2018-12-25 MED ORDER — LACTATED RINGERS IV SOLN
Freq: Once | INTRAVENOUS | Status: AC
  Administered 2018-12-25: 1000 mL via INTRAVENOUS

## 2018-12-25 MED ORDER — BUPIVACAINE LIPOSOME 1.3 % IJ SUSP
INTRAMUSCULAR | Status: AC
  Filled 2018-12-25: qty 20

## 2018-12-25 MED ORDER — MEPERIDINE HCL 50 MG/ML IJ SOLN: 6.2500 mg | INTRAMUSCULAR | Status: DC | PRN

## 2018-12-25 MED ORDER — FENTANYL CITRATE (PF) 250 MCG/5ML IJ SOLN
INTRAMUSCULAR | Status: AC
  Filled 2018-12-25: qty 5

## 2018-12-25 MED ORDER — LIDOCAINE VISCOUS HCL 2 % MT SOLN
OROMUCOSAL | Status: AC
  Filled 2018-12-25: qty 15

## 2018-12-25 MED ORDER — ONDANSETRON HCL 4 MG/2ML IJ SOLN
INTRAMUSCULAR | Status: AC
  Filled 2018-12-25: qty 4

## 2018-12-25 MED ORDER — HEPARIN SOD (PORK) LOCK FLUSH 100 UNIT/ML IV SOLN: 500.0000 [IU] | INTRAVENOUS | Status: DC | PRN

## 2018-12-25 MED ORDER — SODIUM CHLORIDE 0.9 % IV SOLN
1.0000 g | INTRAVENOUS | Status: AC
  Administered 2018-12-25: 1 g via INTRAVENOUS
  Filled 2018-12-25: qty 1

## 2018-12-25 MED ORDER — CHLORHEXIDINE GLUCONATE CLOTH 2 % EX PADS: 6.0000 | MEDICATED_PAD | Freq: Once | CUTANEOUS | Status: DC

## 2018-12-25 MED ORDER — MIDAZOLAM HCL 5 MG/5ML IJ SOLN
INTRAMUSCULAR | Status: DC | PRN
  Administered 2018-12-25: 2 mg via INTRAVENOUS

## 2018-12-25 MED ORDER — FENTANYL CITRATE (PF) 100 MCG/2ML IJ SOLN
INTRAMUSCULAR | Status: DC | PRN
  Administered 2018-12-25: 50 ug via INTRAVENOUS

## 2018-12-25 MED ORDER — PROPOFOL 10 MG/ML IV BOLUS
INTRAVENOUS | Status: AC
  Filled 2018-12-25: qty 20

## 2018-12-25 MED ORDER — MIDAZOLAM HCL 2 MG/2ML IJ SOLN
INTRAMUSCULAR | Status: AC
  Filled 2018-12-25: qty 2

## 2018-12-25 MED ORDER — DEXAMETHASONE SODIUM PHOSPHATE 10 MG/ML IJ SOLN
INTRAMUSCULAR | Status: DC | PRN
  Administered 2018-12-25: 10 mg via INTRAVENOUS

## 2018-12-25 MED ORDER — ONDANSETRON HCL 4 MG/2ML IJ SOLN: 4.0000 mg | Freq: Once | INTRAMUSCULAR | Status: DC | PRN

## 2018-12-25 MED ORDER — DEXAMETHASONE SODIUM PHOSPHATE 10 MG/ML IJ SOLN
INTRAMUSCULAR | Status: AC
  Filled 2018-12-25: qty 1

## 2018-12-25 MED ORDER — HYDROMORPHONE HCL 1 MG/ML IJ SOLN: 0.5000 mg | INTRAMUSCULAR | Status: DC | PRN

## 2018-12-25 MED ORDER — HEPARIN SOD (PORK) LOCK FLUSH 100 UNIT/ML IV SOLN
INTRAVENOUS | Status: AC
  Filled 2018-12-25: qty 5

## 2018-12-25 MED ORDER — LACTATED RINGERS IV SOLN
INTRAVENOUS | Status: DC | PRN
  Administered 2018-12-25: 07:00:00 via INTRAVENOUS

## 2018-12-25 MED ORDER — LIDOCAINE VISCOUS HCL 2 % MT SOLN
OROMUCOSAL | Status: DC | PRN
  Administered 2018-12-25: 1

## 2018-12-25 MED ORDER — ONDANSETRON HCL 4 MG/2ML IJ SOLN
INTRAMUSCULAR | Status: DC | PRN
  Administered 2018-12-25: 4 mg via INTRAVENOUS

## 2018-12-25 MED ORDER — BUPIVACAINE LIPOSOME 1.3 % IJ SUSP
INTRAMUSCULAR | Status: DC | PRN
  Administered 2018-12-25: 13 mL

## 2018-12-25 MED ORDER — 0.9 % SODIUM CHLORIDE (POUR BTL) OPTIME
TOPICAL | Status: DC | PRN
  Administered 2018-12-25: 1000 mL

## 2018-12-25 MED ORDER — PROPOFOL 10 MG/ML IV BOLUS
INTRAVENOUS | Status: DC | PRN
  Administered 2018-12-25: 150 mg via INTRAVENOUS

## 2018-12-25 MED ORDER — KETOROLAC TROMETHAMINE 30 MG/ML IJ SOLN
30.0000 mg | Freq: Once | INTRAMUSCULAR | Status: AC
  Administered 2018-12-25: 30 mg via INTRAVENOUS
  Filled 2018-12-25: qty 1

## 2018-12-25 MED ORDER — SODIUM CHLORIDE 0.9% FLUSH: 10.0000 mL | INTRAVENOUS | Status: DC | PRN

## 2018-12-25 SURGICAL SUPPLY — 27 items
CLOTH BEACON ORANGE TIMEOUT ST (SAFETY) ×2 IMPLANT
COVER LIGHT HANDLE STERIS (MISCELLANEOUS) ×4 IMPLANT
COVER WAND RF STERILE (DRAPES) ×2 IMPLANT
DRAPE HALF SHEET 40X57 (DRAPES) ×4 IMPLANT
ELECT REM PT RETURN 9FT ADLT (ELECTROSURGICAL) ×2
ELECTRODE REM PT RTRN 9FT ADLT (ELECTROSURGICAL) ×1 IMPLANT
GAUZE SPONGE 4X4 12PLY STRL (GAUZE/BANDAGES/DRESSINGS) ×2 IMPLANT
GLOVE BIO SURGEON STRL SZ7 (GLOVE) ×2 IMPLANT
GLOVE BIOGEL PI IND STRL 7.0 (GLOVE) ×2 IMPLANT
GLOVE BIOGEL PI INDICATOR 7.0 (GLOVE) ×2
GLOVE SURG SS PI 7.5 STRL IVOR (GLOVE) ×2 IMPLANT
GOWN STRL REUS W/TWL LRG LVL3 (GOWN DISPOSABLE) ×4 IMPLANT
HEMOSTAT SURGICEL 4X8 (HEMOSTASIS) ×2 IMPLANT
KIT TURNOVER CYSTO (KITS) ×2 IMPLANT
LIGASURE IMPACT 36 18CM CVD LR (INSTRUMENTS) ×2 IMPLANT
MANIFOLD NEPTUNE II (INSTRUMENTS) ×2 IMPLANT
NEEDLE HYPO 18GX1.5 BLUNT FILL (NEEDLE) ×2 IMPLANT
NEEDLE HYPO 22GX1.5 SAFETY (NEEDLE) ×2 IMPLANT
NS IRRIG 1000ML POUR BTL (IV SOLUTION) ×2 IMPLANT
PACK PERI GYN (CUSTOM PROCEDURE TRAY) ×2 IMPLANT
PAD ARMBOARD 7.5X6 YLW CONV (MISCELLANEOUS) ×2 IMPLANT
SET BASIN LINEN APH (SET/KITS/TRAYS/PACK) ×2 IMPLANT
SURGILUBE 3G PEEL PACK STRL (MISCELLANEOUS) ×2 IMPLANT
SUT CHROMIC 2 0 SH (SUTURE) ×4 IMPLANT
SUT SILK 0 FSL (SUTURE) ×2 IMPLANT
SUT VIC AB 2-0 CT2 27 (SUTURE) ×2 IMPLANT
SYR 20CC LL (SYRINGE) ×4 IMPLANT

## 2018-12-25 NOTE — Anesthesia Procedure Notes (Signed)
Procedure Name: LMA Insertion Date/Time: 12/25/2018 8:33 AM Performed by: Ollen Bowl, CRNA Pre-anesthesia Checklist: Patient identified, Patient being monitored, Emergency Drugs available, Timeout performed and Suction available Patient Re-evaluated:Patient Re-evaluated prior to induction Oxygen Delivery Method: Circle System Utilized Preoxygenation: Pre-oxygenation with 100% oxygen Induction Type: IV induction Ventilation: Mask ventilation without difficulty LMA: LMA inserted LMA Size: 4.0 Number of attempts: 1 Placement Confirmation: positive ETCO2 and breath sounds checked- equal and bilateral

## 2018-12-25 NOTE — Interval H&P Note (Signed)
History and Physical Interval Note:  12/25/2018 8:15 AM  Amber Williams  has presented today for surgery, with the diagnosis of internal and external hemorrhoids.  The various methods of treatment have been discussed with the patient and family. After consideration of risks, benefits and other options for treatment, the patient has consented to  Procedure(s): EXTENSIVE HEMORRHOIDECTOMY (N/A) as a surgical intervention.  The patient's history has been reviewed, patient examined, no change in status, stable for surgery.  I have reviewed the patient's chart and labs.  Questions were answered to the patient's satisfaction.     Aviva Signs

## 2018-12-25 NOTE — Anesthesia Preprocedure Evaluation (Signed)
Anesthesia Evaluation  Patient identified by MRN, date of birth, ID band Patient awake    Reviewed: Allergy & Precautions, NPO status , Patient's Chart, lab work & pertinent test results  Airway Mallampati: II  TM Distance: >3 FB Neck ROM: Full    Dental no notable dental hx.    Pulmonary former smoker,    Pulmonary exam normal breath sounds clear to auscultation       Cardiovascular hypertension (patient denied), Normal cardiovascular exam Rhythm:Regular Rate:Normal     Neuro/Psych PSYCHIATRIC DISORDERS Anxiety Depression  Neuromuscular disease (multiple sclerosis, right sided weakness)    GI/Hepatic   Endo/Other  Hypothyroidism   Renal/GU      Musculoskeletal  (+) Arthritis ,   Abdominal   Peds  Hematology   Anesthesia Other Findings   Reproductive/Obstetrics                             Anesthesia Physical Anesthesia Plan  ASA: III  Anesthesia Plan: General   Post-op Pain Management:    Induction:   PONV Risk Score and Plan: Ondansetron, Dexamethasone and Midazolam  Airway Management Planned: LMA  Additional Equipment:   Intra-op Plan:   Post-operative Plan: Extubation in OR  Informed Consent: I have reviewed the patients History and Physical, chart, labs and discussed the procedure including the risks, benefits and alternatives for the proposed anesthesia with the patient or authorized representative who has indicated his/her understanding and acceptance.     Dental advisory given  Plan Discussed with:   Anesthesia Plan Comments:         Anesthesia Quick Evaluation

## 2018-12-25 NOTE — Discharge Instructions (Signed)
General Anesthesia, Adult, Care After °This sheet gives you information about how to care for yourself after your procedure. Your health care provider may also give you more specific instructions. If you have problems or questions, contact your health care provider. °What can I expect after the procedure? °After the procedure, the following side effects are common: °· Pain or discomfort at the IV site. °· Nausea. °· Vomiting. °· Sore throat. °· Trouble concentrating. °· Feeling cold or chills. °· Weak or tired. °· Sleepiness and fatigue. °· Soreness and body aches. These side effects can affect parts of the body that were not involved in surgery. °Follow these instructions at home: ° °For at least 24 hours after the procedure: °· Have a responsible adult stay with you. It is important to have someone help care for you until you are awake and alert. °· Rest as needed. °· Do not: °? Participate in activities in which you could fall or become injured. °? Drive. °? Use heavy machinery. °? Drink alcohol. °? Take sleeping pills or medicines that cause drowsiness. °? Make important decisions or sign legal documents. °? Take care of children on your own. °Eating and drinking °· Follow any instructions from your health care provider about eating or drinking restrictions. °· When you feel hungry, start by eating small amounts of foods that are soft and easy to digest (bland), such as toast. Gradually return to your regular diet. °· Drink enough fluid to keep your urine pale yellow. °· If you vomit, rehydrate by drinking water, juice, or clear broth. °General instructions °· If you have sleep apnea, surgery and certain medicines can increase your risk for breathing problems. Follow instructions from your health care provider about wearing your sleep device: °? Anytime you are sleeping, including during daytime naps. °? While taking prescription pain medicines, sleeping medicines, or medicines that make you drowsy. °· Return to  your normal activities as told by your health care provider. Ask your health care provider what activities are safe for you. °· Take over-the-counter and prescription medicines only as told by your health care provider. °· If you smoke, do not smoke without supervision. °· Keep all follow-up visits as told by your health care provider. This is important. °Contact a health care provider if: °· You have nausea or vomiting that does not get better with medicine. °· You cannot eat or drink without vomiting. °· You have pain that does not get better with medicine. °· You are unable to pass urine. °· You develop a skin rash. °· You have a fever. °· You have redness around your IV site that gets worse. °Get help right away if: °· You have difficulty breathing. °· You have chest pain. °· You have blood in your urine or stool, or you vomit blood. °Summary °· After the procedure, it is common to have a sore throat or nausea. It is also common to feel tired. °· Have a responsible adult stay with you for the first 24 hours after general anesthesia. It is important to have someone help care for you until you are awake and alert. °· When you feel hungry, start by eating small amounts of foods that are soft and easy to digest (bland), such as toast. Gradually return to your regular diet. °· Drink enough fluid to keep your urine pale yellow. °· Return to your normal activities as told by your health care provider. Ask your health care provider what activities are safe for you. °This information is not   intended to replace advice given to you by your health care provider. Make sure you discuss any questions you have with your health care provider. Document Released: 07/24/2000 Document Revised: 04/20/2017 Document Reviewed: 12/01/2016 Elsevier Patient Education  2020 [employer]. How to Take a [employer] A sitz bath is a warm water bath that may be used to care for your rectum, genital area, or the area between your rectum and  genitals (perineum). For a sitz bath, the water only comes up to your hips and covers your buttocks. A sitz bath may done at home in a bathtub or with a portable sitz bath that fits over the toilet. Your health care provider may recommend a sitz bath to help:  Relieve pain and discomfort after delivering a baby.  Relieve pain and itching from hemorrhoids or anal fissures.  Relieve pain after certain surgeries.  Relax muscles that are sore or tight. How to take a sitz bath Take 3-4 sitz baths a day, or as many as told by your health care provider. Bathtub sitz bath To take a sitz bath in a bathtub: 1. Partially fill a bathtub with warm water. The water should be deep enough to cover your hips and buttocks when you are sitting in the tub. 2. If your health care provider told you to put medicine in the water, follow his or her instructions. 3. Sit in the water. 4. Open the tub drain a little, and leave it open during your bath. 5. Turn on the warm water again, enough to replace the water that is draining out. Keep the water running throughout your bath. This helps keep the water at the right level and the right temperature. 6. Soak in the water for 15-20 minutes, or as long as told by your health care provider. 7. When you are done, be careful when you stand up. You may feel dizzy. 8. After the sitz bath, pat yourself dry. Do not rub your skin to dry it.  Over-the-toilet sitz bath To take a sitz bath with an over-the-toilet basin: 1. Follow the manufacturer's instructions. 2. Fill the basin with warm water. 3. If your health care provider told you to put medicine in the water, follow his or her instructions. 4. Sit on the seat. Make sure the water covers your buttocks and perineum. 5. Soak in the water for 15-20 minutes, or as long as told by your health care provider. 6. After the sitz bath, pat yourself dry. Do not rub your skin to dry it. 7. Clean and dry the basin between  uses. 8. Discard the basin if it cracks, or according to the manufacturer's instructions. Contact a health care provider if:  Your symptoms get worse. Do not continue with sitz baths if your symptoms get worse.  You have new symptoms. If this happens, do not continue with sitz baths until you talk with your health care provider. Summary  A sitz bath is a warm water bath in which the water only comes up to your hips and covers your buttocks.  A sitz bath may help relieve itching, relieve pain, and relax muscles that are sore or tight in the lower part of your body, including your genital area.  Take 3-4 sitz baths a day, or as many as told by your health care provider. Soak in the water for 15-20 minutes.  Do not continue with sitz baths if your symptoms get worse. This information is not intended to replace advice given to you by your  health care provider. Make sure you discuss any questions you have with your health care provider. Document Released: 01/08/2004 Document Revised: 04/19/2017 Document Reviewed: 04/19/2017 Elsevier Patient Education  2020 [employer]. Surgical Procedures for Hemorrhoids, Care After This sheet gives you information about how to care for yourself after your procedure. Your health care provider may also give you more specific instructions. If you have problems or questions, contact your health care provider. What can I expect after the procedure? After the procedure, it is common to have:  Rectal pain.  Pain when you are having a bowel movement.  Slight rectal bleeding. This is more likely to happen with the first bowel movement after surgery. Follow these instructions at home: Medicines  Take over-the-counter and prescription medicines only as told by your health care provider.  If you were prescribed an antibiotic medicine, use it as told by your health care provider. Do not stop using the antibiotic even if your condition improves.  Ask your health  care provider if the medicine prescribed to you requires you to avoid driving or using heavy machinery.  Use a stool softener or a bulk laxative as told by your health care provider. Eating and drinking  Follow instructions from your health care provider about what to eat or drink after your procedure.  You may need to take actions to prevent or treat constipation, such as: ? Drink enough fluid to keep your urine pale yellow. ? Take over-the-counter or prescription medicines. ? Eat foods that are high in fiber, such as beans, whole grains, and fresh fruits and vegetables. ? Limit foods that are high in fat and processed sugars, such as fried or sweet foods. Activity   Rest as told by your health care provider.  Avoid sitting for a long time without moving. Get up to take short walks every 1-2 hours. This is important to improve blood flow and breathing. Ask for help if you feel weak or unsteady.  Return to your normal activities as told by your health care provider. Ask your health care provider what activities are safe for you.  Do not lift anything that is heavier than 10 lb (4.5 kg), or the limit that you are told, until your health care provider says that it is safe.  Do not strain to have a bowel movement.  Do not spend a long time sitting on the toilet. General instructions   Take warm sitz baths for 15-20 minutes, 2-3 times a day to relieve soreness or itching and to keep the rectal area clean.  Apply ice packs to the area to reduce swelling and pain.  Do not drive for 24 hours if you were given a sedative during your procedure.  Keep all follow-up visits as told by your health care provider. This is important. Contact a health care provider if:  Your pain medicine is not helping.  You have a fever or chills.  You have bad smelling drainage.  You have a lot of swelling.  You become constipated.  You have trouble passing urine. Get help right away if:  You  have very bad rectal pain.  You have heavy bleeding from your rectum. Summary  After the procedure, it is common to have pain and slight rectal bleeding.  Take warm sitz baths for 15-20 minutes, 2-3 times a day to relieve soreness or itching and to keep the rectal area clean.  Avoid straining when having a bowel movement.  Eat foods that are high in fiber, such  as beans, whole grains, and fresh fruits and vegetables.  Take over-the-counter and prescription medicines only as told by your health care provider. This information is not intended to replace advice given to you by your health care provider. Make sure you discuss any questions you have with your health care provider. Document Released: 07/08/2003 Document Revised: 03/05/2018 Document Reviewed: 03/05/2018 Elsevier Patient Education  2020 [employer].

## 2018-12-25 NOTE — Anesthesia Postprocedure Evaluation (Signed)
Anesthesia Post Note  Patient: DEBERAH STROPE  Procedure(s) Performed: EXTENSIVE HEMORRHOIDECTOMY (N/A Rectum)  Patient location during evaluation: PACU Anesthesia Type: General Level of consciousness: awake and alert and oriented Pain management: pain level controlled Vital Signs Assessment: post-procedure vital signs reviewed and stable Respiratory status: spontaneous breathing Cardiovascular status: blood pressure returned to baseline and stable Postop Assessment: no apparent nausea or vomiting Anesthetic complications: no     Last Vitals:  Vitals:   12/25/18 0920 12/25/18 0930  BP: (!) 91/58 101/63  Pulse: 91 86  Resp: 17 16  Temp: 36.7 C   SpO2: 97% 98%    Last Pain:  Vitals:   12/25/18 0936  TempSrc:   PainSc: 2                  Jamisen Hawes

## 2018-12-25 NOTE — Op Note (Signed)
Patient:  Amber Williams  DOB:  July 19, 1975  MRN:  [id number]   Preop Diagnosis:  Anal condylloma   Postop Diagnosis: Same  Procedure: Extensive excision of anal lesions/probable condyloma, hemorrhoidectomy (external)  Surgeon: Aviva Signs, MD  Anes: General  Indications: Patient is a 43 year old white female who presents with hemorrhoidal disease as well as probable anal condyloma along the superior aspect of the anal verge.  The risks and benefits of the procedure including bleeding, infection, and recurrence of the condyloma as well as the hemorrhoidal disease were fully explained to the patient, who gave informed consent.  Procedure note: The patient was placed in lithotomy position after general anesthesia was administered.  The perineum was prepped and draped using usual sterile technique with Betadine.  Surgical site confirmation was performed.  On examination of the anus, the patient had some internal and external hemorrhoids in the posterior position but she had extensive anal condyloma along the superior half of the anus with involvement of an external hemorrhoid.  It encompasses probably a third of the circumference of the anal verge.  The anal condyloma were excised using the LigaSure without difficulty.  A small external hemorrhoid was also excised using the LigaSure at the 2 o'clock position.  A small mucosal flap was then created in order to close the excision site superiorly and this was advanced and secured using 2-0 Chromic Gut interrupted sutures.  Care was taken to avoid the external sphincter mechanism.  It was noted to be intact at the end of the procedure.  No abnormal bleeding was noted at the end of the procedure.  Exparel was instilled into the surrounding wound.  Surgicel and Viscous Xylocaine rectal packing was then placed.  All tape and needle counts were correct at the end of the procedure.  The patient was awakened and transferred to PACU in stable  condition.  Complications: None  EBL: Minimal  Specimen: Hemorrhoids, anal condyloma

## 2018-12-25 NOTE — Transfer of Care (Signed)
Immediate Anesthesia Transfer of Care Note  Patient: Amber Williams  Procedure(s) Performed: EXTENSIVE HEMORRHOIDECTOMY (N/A Rectum)  Patient Location: PACU  Anesthesia Type:General  Level of Consciousness: awake  Airway & Oxygen Therapy: Patient Spontanous Breathing and Patient connected to nasal cannula oxygen  Post-op Assessment: Report given to RN  Post vital signs: Reviewed  Last Vitals:  Vitals Value Taken Time  BP    Temp    Pulse    Resp    SpO2      Last Pain:  Vitals:   12/25/18 0714  TempSrc: Oral  PainSc: 3       Patients Stated Pain Goal: 9 ([id number])  Complications: No apparent anesthesia complications

## 2018-12-26 ENCOUNTER — Encounter (HOSPITAL_COMMUNITY): Payer: Self-pay | Admitting: General Surgery

## 2018-12-31 ENCOUNTER — Telehealth: Payer: Self-pay | Admitting: General Surgery

## 2019-01-02 ENCOUNTER — Encounter: Payer: Self-pay | Admitting: General Surgery

## 2019-01-02 ENCOUNTER — Ambulatory Visit (INDEPENDENT_AMBULATORY_CARE_PROVIDER_SITE_OTHER): Payer: Self-pay | Admitting: General Surgery

## 2019-01-02 ENCOUNTER — Other Ambulatory Visit: Payer: Self-pay

## 2019-01-02 VITALS — BP 116/78 | HR 101 | Temp 97.1°F | Resp 16 | Ht 65.0 in | Wt 187.0 lb

## 2019-01-02 DIAGNOSIS — Z09 Encounter for follow-up examination after completed treatment for conditions other than malignant neoplasm: Secondary | ICD-10-CM

## 2019-01-02 NOTE — Patient Instructions (Signed)
Anal Cancer ° °Anal cancer is a disease where cancer cells form in the tissue of the anus. The anus the last part of the large intestine, where stool leaves the body. The most common type of anal cancer is called squamous cell carcinoma. °What are the causes? °The cause of this condition is not known. °What increases the risk? °You are more likely to develop this condition if: °· You have HPV (human papillomavirus). °· You engage in sexual practices that increase your risk of HPV. These include: °? Having many sexual partners. °? Having anal sex. °? Being female and having sex with other males. °· You smoke cigarettes. °· You have a history of cervical, vaginal, or vulvar cancer. °· You have a weakened immune system due to: °? Medicines called immunosuppressants. °? Infection with HIV (human immunodeficiency virus) or AIDS (acquired immunodeficiency syndrome). °· You have an autoimmune disorder like Crohn disease or psoriasis. °· You have a history of STIs (sexually transmitted infections). °What are the signs or symptoms? °Symptoms of this condition include: °· Pain or pressure around the anus. °· Bleeding from the anus or rectum. °· A lump near the anus. °· A change in bowel habits. °· Itching around the anus. °· Discharge from the anus. °· Swollen lymph nodes around the anus or groin. °How is this diagnosed? °This condition is diagnosed based on a review of your medical history and the results of a physical exam and tests. Exams and tests may include: °· A digital rectal exam. During this exam, a gloved, lubricated finger is inserted into the anus and rectum to check for lumps. °· Anoscopy. During this test, a hollow tube is inserted into the anus and rectum. A health care provider looks through the tube for lumps and signs of disease. °· Proctoscopy. During this test, a lighted, hollow tube is inserted into the rectum. A health care provider looks through this tube to see the lower part of the colon. °· Endoanal  or endorectal ultrasound. During this test, a small probe is inserted into the anus or rectum. °· Biopsy. During this test, a tissue sample is taken from the anus and rectum to be examined under a microscope for signs of cancer. °Your health care provider may refer you to an expert who specializes in diagnosing and treating anal cancer. If you are diagnosed with cancer, you may need to have more tests to see if the cancer has spread to other parts of the body. These tests can include: °· X-ray. °· CT scan. °· MRI. °· PET scan. °How is this treated? °Treatment for this condition depends on where the tumor is, the type of cancer, and how much the cancer has spread in the body (the stage of the cancer). Treatment can include any combination of the following: °· Surgery. This may be done to remove the tumor and any lymph nodes that are infected with cancer. If the cancer is severe, surgery may be done to remove the anus, rectum, and part of the colon. °· Radiation therapy. This treatment uses high energy radiation or X-rays to kill cancer cells or stop them from growing. °· Chemotherapy. This treatment involves drugs that kill cancer cells or stop them from growing. °Follow these instructions at home: °Learning about your cancer °· Learn about your cancer and your treatment options. Make sure you understand the potential side effects of treatment. °· Ask about getting a second opinion. This can help you make a more informed decision about your treatment options. °  Eating and drinking °· Drink enough fluid to keep your urine clear or pale yellow. °· Limit alcohol intake to no more than 1 drink a day for nonpregnant women and 2 drinks a day for men. One drink equals 12 oz of beer, 5 oz of wine, or 1½ oz of hard liquor. °· Eat a healthy diet. When planning meals: °? Aim to get 2 ½ cups of fruits and vegetables a day. °? Choose high-fiber foods like whole-grain breads and cereals. °Activity °· During and after treatment,  return to your normal activities as told by your health care provider. Ask your health care provider what activities are safe for you. °· Exercise regularly. Aim for 30 minutes of moderate-intensity exercise 5 days a week, such as walking or yoga. °· Talk with your health care provider before starting any exercise routine. This is important. °General instructions ° °· Take over-the-counter and prescription medicines only as told by your health care provider. °· Do not use any products that contain nicotine or tobacco, such as cigarettes and e-cigarettes. If you need help quitting, ask your health care provider. °· Keep all follow-up visits as told by your health care provider. This is important. °How is this prevented? °You cannot prevent this condition completely, but you can lower your risk of developing it by: °· Getting the HPV vaccine. °· Avoiding infection with HPV and HIV. You can do this by: °? Limiting your number of sexual partners. °? Using protection, like condoms, during all sexual activity. Note that condoms cannot completely protect you from HPV. °· Not smoking. If you need help quitting, ask your health care provider. °Contact a health care provider if: °· You have bleeding or discharge from your anus. °· You have pain or pressure near your anus. °· You have a change in bowel habits or diarrhea. °· You have nausea or vomiting. °Get help right away if: °· You have a fever. °· You have chest pains or shortness of breath. °· You have a severe headache with a stiff neck. °· You have bloody or cloudy urine. °· You are confused. °· You have any swelling in your legs or arms or around a wound. °Summary °· Anal cancer is a disease where cancer cells form in the tissue of the anus. °· You are more likely to develop anal cancer if you are infected with HPV, you smoke cigarettes, or if you have a weakened immune system due to medicines, HIV, or AIDS. °· Treatment for anal cancer can include surgery, radiation  therapy, or chemotherapy. °· You can lower your risk for anal cancer by getting the HPV vaccine, not smoking, and avoiding infection with HPV and HIV. °This information is not intended to replace advice given to you by your health care provider. Make sure you discuss any questions you have with your health care provider. °Document Released: 03/06/2016 Document Revised: 03/06/2016 Document Reviewed: 03/06/2016 °Elsevier Patient Education © 2020 [employer] ° °

## 2019-01-02 NOTE — Progress Notes (Signed)
Subjective:     Amber Williams  Patient here for postoperative visit.  Is doing well.  States that her rectal pain is minimal.  She is able to defecate without significant issue.  She denies any significant bleeding. Objective:    BP 116/78 (BP Location: Left Arm, Patient Position: Sitting, Cuff Size: Normal)   Pulse (!) 101   Temp (!) 97.1 F (36.2 C) (Tympanic)   Resp 16   Ht 5\' 5"  (1.651 m)   Wt 187 lb (84.8 kg)   SpO2 97%   BMI 31.12 kg/m   General:  alert, cooperative and no distress  Rectum has healed well.  No active bleeding noted. 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 Squamous cell carcinoma of the anus    Plan:   Patient is aware of the final diagnosis.  We discussed what anal cancer is and the possible treatment options.  Have referred patient to oncology for further management and treatment.  Follow-up here as needed.

## 2019-01-10 ENCOUNTER — Inpatient Hospital Stay (HOSPITAL_COMMUNITY): Payer: Medicare HMO | Attending: Hematology | Admitting: Hematology

## 2019-01-10 ENCOUNTER — Other Ambulatory Visit: Payer: Self-pay

## 2019-01-10 ENCOUNTER — Inpatient Hospital Stay (HOSPITAL_COMMUNITY): Payer: Medicare HMO

## 2019-01-10 ENCOUNTER — Encounter (HOSPITAL_COMMUNITY): Payer: Self-pay | Admitting: Hematology

## 2019-01-10 VITALS — BP 108/50 | HR 107 | Temp 98.4°F | Resp 18 | Ht 65.0 in | Wt 185.5 lb

## 2019-01-10 DIAGNOSIS — Z87891 Personal history of nicotine dependence: Secondary | ICD-10-CM | POA: Diagnosis not present

## 2019-01-10 DIAGNOSIS — Z8249 Family history of ischemic heart disease and other diseases of the circulatory system: Secondary | ICD-10-CM | POA: Diagnosis not present

## 2019-01-10 DIAGNOSIS — Z0000 Encounter for general adult medical examination without abnormal findings: Secondary | ICD-10-CM

## 2019-01-10 DIAGNOSIS — R51 Headache: Secondary | ICD-10-CM | POA: Diagnosis not present

## 2019-01-10 DIAGNOSIS — C210 Malignant neoplasm of anus, unspecified: Secondary | ICD-10-CM

## 2019-01-10 DIAGNOSIS — K5900 Constipation, unspecified: Secondary | ICD-10-CM | POA: Diagnosis not present

## 2019-01-10 DIAGNOSIS — Z803 Family history of malignant neoplasm of breast: Secondary | ICD-10-CM | POA: Diagnosis not present

## 2019-01-10 DIAGNOSIS — Z23 Encounter for immunization: Secondary | ICD-10-CM | POA: Diagnosis present

## 2019-01-10 DIAGNOSIS — R5383 Other fatigue: Secondary | ICD-10-CM

## 2019-01-10 DIAGNOSIS — R531 Weakness: Secondary | ICD-10-CM | POA: Insufficient documentation

## 2019-01-10 DIAGNOSIS — Z833 Family history of diabetes mellitus: Secondary | ICD-10-CM | POA: Diagnosis not present

## 2019-01-10 DIAGNOSIS — K219 Gastro-esophageal reflux disease without esophagitis: Secondary | ICD-10-CM | POA: Insufficient documentation

## 2019-01-10 DIAGNOSIS — G35 Multiple sclerosis: Secondary | ICD-10-CM | POA: Diagnosis not present

## 2019-01-10 HISTORY — DX: Malignant neoplasm of anus, unspecified: C21.0

## 2019-01-10 MED ORDER — INFLUENZA VAC SPLIT QUAD 0.5 ML IM SUSY
0.5000 mL | PREFILLED_SYRINGE | Freq: Once | INTRAMUSCULAR | Status: AC
  Administered 2019-01-10: 14:00:00 0.5 mL via INTRAMUSCULAR
  Filled 2019-01-10: qty 0.5

## 2019-01-10 MED ORDER — HYDROCODONE-ACETAMINOPHEN 10-325 MG PO TABS: 1.0000 | ORAL_TABLET | Freq: Every evening | ORAL | 0 refills | Status: DC | PRN

## 2019-01-10 NOTE — Progress Notes (Signed)
I met with patient during the visit with Dr. Delton Coombes. She was with her mom.  I explained to patient how I will be involved in her care.  I provided my contact information so that she can reach me should she have any questions or concerns arise between now and her next visit. She verbalizes appreciation and understanding.

## 2019-01-10 NOTE — Patient Instructions (Signed)
[place] at [facility] Discharge Instructions  You were seen today by Dr. Delton Coombes. He went over your history, family history and how you've been feeling lately. He discussed your recent test results and your plan of care. He will schedule you for a PET scan. He will get blood work drawn today. He will see you back after your scan for follow up.   Thank you for choosing [facility] to provide your oncology and hematology care.  To afford each patient quality time with our provider, please arrive at least 15 minutes before your scheduled appointment time.   If you have a lab appointment with the [facility] please come in thru the  Main Entrance and check in at the main information desk  You need to re-schedule your appointment should you arrive 10 or more minutes late.  We strive to give you quality time with our providers, and arriving late affects you and other patients whose appointments are after yours.  Also, if you no show three or more times for appointments you may be dismissed from the clinic at the providers discretion.     Again, thank you for choosing [facility].  Our hope is that these requests will decrease the amount of time that you wait before being seen by our physicians.       _____________________________________________________________  Should you have questions after your visit to [facility], please contact our office at [phone] between the hours of 8:00 a.m. and 4:30 p.m.  Voicemails left after 4:00 p.m. will not be returned until the following business day.  For prescription refill requests, have your pharmacy contact our office and allow 72 hours.    Cancer Center Support Programs:   > Cancer Support Group  2nd Tuesday of the month 1pm-2pm, Journey Room

## 2019-01-10 NOTE — Assessment & Plan Note (Addendum)
1.  Anal cancer: - She has history of hemorrhoids since age 43.  She had worsening bleeding per rectum and had felt a mass in the perianal region lately. - She was seen by Dr. 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 -She reportedly had a colonoscopy 5 years ago in [facility]. -Physical exam today did not reveal any mass in the anal canal.  There is a ulcerated area in the anterior anal orifice with thickening.  No inguinal adenopathy. - I have recommended a PET CT scan for staging purposes.  We will also obtain P 16 staining on the pathology.  We will do HIV testing. - She already has a port placed.  We will see her back after the PET CT scan to discuss staging and treatment.  2.  Multiple sclerosis: - She had multiple sclerosis of several years duration resulting in right-sided weakness, fatigue, memory issues and headaches.  She walks with a cane and walker. -She has been treated with multiple agents including Tecfidera, Betaseron, Avonex, Copaxone.  She also received mitoxantrone in the past.  She is currently receiving Ocrevus every 6 months.

## 2019-01-10 NOTE — Progress Notes (Signed)
[facility] NOTE  Patient Care Team: [name], MD as PCP - General (Family Medicine) Kathrynn Ducking, MD as Consulting Physician (Neurology)  CHIEF COMPLAINTS/PURPOSE OF CONSULTATION:  Newly diagnosed anal cancer.  HISTORY OF PRESENTING ILLNESS:  Amber Williams 43 y.o. female is seen for evaluation and management of newly diagnosed anal cancer at the request of Dr. Arnoldo Morale.  She had a long history of hemorrhoids since age 42.  However she has noticed increased bleeding per rectum and her hemorrhoids getting bigger lately over the last several weeks.  She was then sent by her PMD to Dr. Arnoldo Morale who did resection of the skin tag/hemorrhoid on 12/25/2018 consistent with moderately differentiated keratinizing squamous cell carcinoma.  She never had any history of prior malignancies.  She had a colonoscopy done 5 years ago in [facility], reportedly normal.  She has a long history of multiple sclerosis with right-sided weakness, fatigue, memory issues and headaches.  She lives at home by herself.  She is on disability at this time.  She worked for a Orth[employer] for 17 years.  She smoked 1 and half pack per day for 15 years and quit about 10 years ago.  She is accompanied by her mother today.  Family history significant for mother with breast cancer.  Paternal grandmother had lymphoma.  She had variety of treatments for her multiple sclerosis in the past including mitoxantrone, Tecfidera, Betaseron, Avonex, Copaxone.  She is currently on Ocrevus every 6 months at this time and is tolerating it reasonably well.  She does have some weakness on the right side and walks with the help of a cane.  She occasionally uses walker.  She had a port placed for her multiple sclerosis treatments as she had poor venous access.  MEDICAL HISTORY:  Past Medical History:  Diagnosis Date  . Allergy   . Anal cancer ([place]) 01/10/2019  . Arthritis   . Blood transfusion without reported  diagnosis    2013  . Depression   . DVT (deep venous thrombosis) ([place]) 2013  . Gait abnormality 08/03/2016  . Hyperlipidemia   . Hypertension   . Hypothyroidism   . Multiple sclerosis ([place])   . Neurogenic bladder 08/03/2016  . Neuromuscular disorder ([place])    dx'd with MS at age of 46  . PTSD (post-traumatic stress disorder)   . Right foot drop 08/03/2016  . Thyroid disease   . Ulcer     SURGICAL HISTORY: Past Surgical History:  Procedure Laterality Date  . COLON SURGERY     Colonoscopy  . GASTRIC BYPASS  2010  . HEMORRHOID SURGERY N/A 12/25/2018   Procedure: EXTENSIVE HEMORRHOIDECTOMY;  Surgeon: Aviva Signs, MD;  Location: [facility];  Service: General;  Laterality: N/A;  . INTERSTIM IMPLANT PLACEMENT  2014  . PORTACATH PLACEMENT      SOCIAL HISTORY: Social History   Socioeconomic History  . Marital status: Single    Spouse name: Ricky  . Number of children: 0  . Years of education: 33  . Highest education level: Associate degree: occupational, Hotel manager, or vocational program  Occupational History  . Occupation: Disability  Social Needs  . Financial resource strain: Not hard at all  . Food insecurity    Worry: Never true    Inability: Never true  . Transportation needs    Medical: No    Non-medical: No  Tobacco Use  . Smoking status: Former Smoker    Packs/day: 1.50    Years: 15.00  Pack years: 22.50    Types: Cigarettes    Start date: 52    Quit date: 01/30/2009    Years since quitting: 9.9  . Smokeless tobacco: Never Used  Substance and Sexual Activity  . 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 Drug use: No  . Sexual activity: Yes  Lifestyle  . Physical activity    Days per week: 2 days    Minutes per session: 30 min  . Stress: Not at all  Relationships  . Social connections    Talks on phone: More than three times a week    Gets together: More than three times a week    Attends religious service: Never    Active member of club  or organization: No    Attends meetings of clubs or organizations: Never    Relationship status: Living with partner  . Intimate partner violence    Fear of current or ex partner: No    Emotionally abused: No    Physically abused: No    Forced sexual activity: No  Other Topics Concern  . Not on file  Social History Narrative   Lives at home with her boyfriend   Right-handed   Caffeine: 2 glasses per day    FAMILY HISTORY: Family History  Problem Relation Age of Onset  . Cancer Mother        Dx'd at age 16  . Breast cancer Mother   . Hypertension Father   . Diabetes Father   . Hypertension Sister   . Miscarriages / Stillbirths Sister   . Heart disease Maternal Grandmother   . Diabetes Maternal Grandmother   . Heart disease Maternal Grandfather   . Diabetes Paternal Grandmother   . Heart disease Paternal Grandfather   . Stroke Paternal Grandfather     ALLERGIES:  is allergic to other and tecfidera [dimethyl fumarate].  MEDICATIONS:  Current Outpatient Medications  Medication Sig Dispense Refill  . amitriptyline (ELAVIL) 50 MG tablet Take 1 tablet (50 mg total) by mouth at bedtime. 30 tablet 5  . Cholecalciferol (VITAMIN D3) 5000 units CAPS Take 5,000 Units by mouth every morning.    . docusate sodium (STOOL SOFTENER) 100 MG capsule Take 100 mg by mouth 2 (two) times daily.     . ferrous fumarate (HEMOCYTE - 106 MG FE) 325 (106 Fe) MG TABS tablet Take 325 mg by mouth 2 (two) times daily.     . flurazepam (DALMANE) 30 MG capsule Take 1 capsule (30 mg total) by mouth at bedtime. 30 capsule 2  . HYDROcodone-acetaminophen (NORCO) 5-325 MG tablet Take 1 tablet by mouth every 6 (six) hours as needed for moderate pain. 25 tablet 0  . levonorgestrel-ethinyl estradiol (NORDETTE) 0.15-30 MG-MCG tablet Take 1 tablet by mouth daily. 1 Package 11  . levothyroxine (SYNTHROID) 100 MCG tablet Take 1 tablet (100 mcg total) by mouth daily. 90 tablet 1  . mometasone (NASONEX) 50 MCG/ACT  nasal spray Place 2 sprays into the nose 2 (two) times daily as needed (allergies).     Wess Botts (OCREVUS IV) Inject into the vein every 6 (six) months.     [name] omeprazole (PRILOSEC) 20 MG capsule Take 1 capsule (20 mg total) by mouth daily. 90 capsule 1  . vitamin B-12 (CYANOCOBALAMIN) 500 MCG tablet Take 500 mcg by mouth daily.    . ALLERGY RELIEF 180 MG tablet Take 180 mg by mouth daily as needed for allergies.     [name]  hydrocortisone (ANUSOL-HC) 2.5 % rectal cream Place 1 application rectally 2 (two) times daily. (Patient not taking: Reported on 01/10/2019) 28 g 5  . loperamide (IMODIUM) 1 MG/5ML solution Take 6 mg by mouth as needed for diarrhea or loose stools.      No current facility-administered medications for this visit.     REVIEW OF SYSTEMS:   Constitutional: Denies fevers, chills or abnormal night sweats Eyes: Denies blurriness of vision, double vision or watery eyes Ears, nose, mouth, throat, and face: Denies mucositis or sore throat Respiratory: Denies cough, dyspnea or wheezes Cardiovascular: Denies palpitation, chest discomfort or lower extremity swelling Gastrointestinal:  Denies nausea, heartburn or change in bowel habits Skin: Denies abnormal skin rashes Lymphatics: Denies new lymphadenopathy or easy bruising Neurological: Slightly decreased muscle strength on the right upper extremity and lower extremity. Behavioral/Psych: Mood is stable, no new changes  All other systems were reviewed with the patient and are negative.   PHYSICAL EXAMINATION: ECOG PERFORMANCE STATUS: 1 - Symptomatic but completely ambulatory  Vitals:   01/10/19 1341  BP: (!) 108/50  Pulse: (!) 107  Resp: 18  Temp: 98.4 F (36.9 C)  SpO2: 100%   Filed Weights   01/10/19 1341  Weight: 185 lb 8 oz (84.1 kg)    GENERAL:alert, no distress and comfortable SKIN: skin color, texture, turgor are normal, no rashes or significant lesions EYES: normal, conjunctiva are pink and non-injected,  sclera clear OROPHARYNX:no exudate, no erythema and lips, buccal mucosa, and tongue normal  NECK: supple, thyroid normal size, non-tender, without nodularity LYMPH:  no palpable lymphadenopathy in the cervical, axillary or inguinal LUNGS: clear to auscultation and percussion with normal breathing effort HEART: regular rate & rhythm and no murmurs and no lower extremity edema ABDOMEN:abdomen soft, non-tender and normal bowel sounds Musculoskeletal:no cyanosis of digits and no clubbing  PSYCH: alert & oriented x 3 with fluent speech NEURO: no focal motor/sensory deficits There is small erythematous ulcerated area in the anterior part of the anal orifice.  There is slight thickening in that area extending few millimeters into the anal canal.  No other masses palpable in the anal canal.  No protruding masses or skin tags seen.  No inguinal adenopathy.  LABORATORY DATA:  I have reviewed the data as listed Lab Results  Component Value Date   WBC 10.8 12/04/2018   HGB 10.2 (L) 12/04/2018   HCT 34.4 12/04/2018   MCV 86 12/04/2018   PLT 453 (H) 12/04/2018     Chemistry      Component Value Date/Time   NA 140 12/04/2018 1427   K 4.2 12/04/2018 1427   CL 105 12/04/2018 1427   CO2 23 12/04/2018 1427   BUN 8 12/04/2018 1427   CREATININE 0.76 12/04/2018 1427      Component Value Date/Time   CALCIUM 9.3 12/04/2018 1427   ALKPHOS 94 12/04/2018 1427   AST 14 12/04/2018 1427   ALT 11 12/04/2018 1427   BILITOT <0.2 12/04/2018 1427       RADIOGRAPHIC STUDIES: I have personally reviewed the radiological images as listed and agreed with the findings in the report.  ASSESSMENT & PLAN:  Anal cancer ([place]) 1.  Anal cancer: - She has history of hemorrhoids since age 63.  She had worsening bleeding per rectum and had felt a mass in the perianal region lately. - She was seen by Dr. Arnoldo Morale who did resection of the anal tag/hemorrhoid on 12/25/2018 consistent with invasive moderately differentiated  keratinizing squamous cell carcinoma. -She  reportedly had a colonoscopy 5 years ago in [facility]. -Physical exam today did not reveal any mass in the anal canal.  There is a ulcerated area in the anterior anal orifice with thickening.  No inguinal adenopathy. - I have recommended a PET CT scan for staging purposes.  We will also obtain P 16 staining on the pathology.  We will do HIV testing. - She already has a port placed.  We will see her back after the PET CT scan to discuss staging and treatment.  2.  Multiple sclerosis: - She had multiple sclerosis of several years duration resulting in right-sided weakness, fatigue, memory issues and headaches.  She walks with a cane and walker. -She has been treated with multiple agents including Tecfidera, Betaseron, Avonex, Copaxone.  She also received mitoxantrone in the past.  She is currently receiving Ocrevus every 6 months.  Orders Placed This Encounter  Procedures  . NM PET Image Initial (PI) Skull Base To Thigh    Standing Status:   Future    Standing Expiration Date:   01/10/2020    Order Specific Question:   ** REASON FOR EXAM (FREE TEXT)    Answer:   cancer of the anus    Order Specific Question:   If indicated for the ordered procedure, I authorize the administration of a radiopharmaceutical per Radiology protocol    Answer:   Yes    Order Specific Question:   Is the patient pregnant?    Answer:   No    Order Specific Question:   Preferred imaging location?    Answer:   [facility]    Order Specific Question:   Radiology Contrast Protocol - do NOT remove file path    Answer:   [email or web address]  . HIV antibody (with reflex)    Standing Status:   Future    Number of Occurrences:   1    Standing Expiration Date:   01/10/2020    All questions were answered. The patient knows to call the clinic with any problems, questions or concerns.      Derek Jack, MD 01/10/2019 4:23 PM

## 2019-01-10 NOTE — Addendum Note (Signed)
Addended by: Derek Jack on: 01/10/2019 06:15 PM   Modules accepted: Orders

## 2019-01-10 NOTE — Progress Notes (Signed)
I emailed pathology and ordered P16 staining on accession # [id number].

## 2019-01-11 LAB — HIV ANTIBODY (ROUTINE TESTING W REFLEX): HIV Screen 4th Generation wRfx: NONREACTIVE

## 2019-01-15 ENCOUNTER — Ambulatory Visit (HOSPITAL_COMMUNITY)
Admission: RE | Admit: 2019-01-15 | Discharge: 2019-01-15 | Disposition: A | Discharge status: Home / Self Care | Payer: Medicare HMO | Source: Ambulatory Visit | Attending: Hematology | Admitting: Hematology

## 2019-01-15 ENCOUNTER — Other Ambulatory Visit: Payer: Self-pay

## 2019-01-15 DIAGNOSIS — I700 Atherosclerosis of aorta: Secondary | ICD-10-CM | POA: Insufficient documentation

## 2019-01-15 DIAGNOSIS — Z87891 Personal history of nicotine dependence: Secondary | ICD-10-CM | POA: Diagnosis not present

## 2019-01-15 DIAGNOSIS — C210 Malignant neoplasm of anus, unspecified: Secondary | ICD-10-CM | POA: Insufficient documentation

## 2019-01-15 DIAGNOSIS — Z79899 Other long term (current) drug therapy: Secondary | ICD-10-CM | POA: Insufficient documentation

## 2019-01-15 DIAGNOSIS — Z79890 Hormone replacement therapy (postmenopausal): Secondary | ICD-10-CM | POA: Diagnosis not present

## 2019-01-15 DIAGNOSIS — E079 Disorder of thyroid, unspecified: Secondary | ICD-10-CM | POA: Diagnosis not present

## 2019-01-15 DIAGNOSIS — E785 Hyperlipidemia, unspecified: Secondary | ICD-10-CM | POA: Diagnosis not present

## 2019-01-15 DIAGNOSIS — I10 Essential (primary) hypertension: Secondary | ICD-10-CM | POA: Diagnosis not present

## 2019-01-15 DIAGNOSIS — E039 Hypothyroidism, unspecified: Secondary | ICD-10-CM | POA: Diagnosis not present

## 2019-01-15 DIAGNOSIS — Z86718 Personal history of other venous thrombosis and embolism: Secondary | ICD-10-CM | POA: Insufficient documentation

## 2019-01-15 DIAGNOSIS — G35 Multiple sclerosis: Secondary | ICD-10-CM | POA: Diagnosis not present

## 2019-01-15 LAB — GLUCOSE, CAPILLARY: Glucose-Capillary: 74 mg/dL (ref 70–99)

## 2019-01-15 MED ORDER — FLUDEOXYGLUCOSE F - 18 (FDG) INJECTION
9.7000 | Freq: Once | INTRAVENOUS | Status: AC | PRN
  Administered 2019-01-15: 9.7 via INTRAVENOUS

## 2019-01-16 ENCOUNTER — Inpatient Hospital Stay (HOSPITAL_COMMUNITY): Payer: Medicare HMO | Admitting: Hematology

## 2019-01-16 ENCOUNTER — Encounter (HOSPITAL_COMMUNITY): Payer: Self-pay | Admitting: Hematology

## 2019-01-16 ENCOUNTER — Other Ambulatory Visit: Payer: Self-pay | Admitting: Neurology

## 2019-01-16 VITALS — BP 112/47 | HR 88 | Temp 97.5°F | Resp 14 | Wt 187.0 lb

## 2019-01-16 DIAGNOSIS — C210 Malignant neoplasm of anus, unspecified: Secondary | ICD-10-CM | POA: Diagnosis not present

## 2019-01-16 DIAGNOSIS — Z0000 Encounter for general adult medical examination without abnormal findings: Secondary | ICD-10-CM

## 2019-01-16 MED ORDER — PREDNISONE 5 MG PO TABS: ORAL_TABLET | ORAL | 0 refills | Status: DC

## 2019-01-16 NOTE — Patient Instructions (Signed)
[place] at [facility] Discharge Instructions  You were seen today by Dr. Delton Coombes. He talked to you about your scan results and there are no other areas of concerns for cancer.  He talked to you about treatment. You will need radiation and chemotherapy.    Thank you for choosing [place] at [facility] to provide your oncology and hematology care.  To afford each patient quality time with our provider, please arrive at least 15 minutes before your scheduled appointment time.   If you have a lab appointment with the [facility] please come in thru the Main Entrance and check in at the main information desk.  You need to re-schedule your appointment should you arrive 10 or more minutes late.  We strive to give you quality time with our providers, and arriving late affects you and other patients whose appointments are after yours.  Also, if you no show three or more times for appointments you may be dismissed from the clinic at the providers discretion.     Again, thank you for choosing [facility].  Our hope is that these requests will decrease the amount of time that you wait before being seen by our physicians.       _____________________________________________________________  Should you have questions after your visit to [facility], please contact our office at [phone] between the hours of 8:00 a.m. and 4:30 p.m.  Voicemails left after 4:00 p.m. will not be returned until the following business day.  For prescription refill requests, have your pharmacy contact our office and allow 72 hours.    Due to Covid, you will need to wear a mask upon entering the hospital. If you do not have a mask, a mask will be given to you at the Main Entrance upon arrival. For doctor visits, patients may have 1 support person with them. For treatment visits, patients can not have anyone with them due to social distancing  guidelines and our immunocompromised population.

## 2019-01-16 NOTE — Progress Notes (Signed)
Amber Williams, [place] [id number]   CLINIC:  Medical Oncology/Hematology  PCP:  [name], MD [place] [address] [phone]   REASON FOR VISIT:  Follow-up for anal cancer.  CURRENT THERAPY: In progress.  BRIEF ONCOLOGIC HISTORY:  Oncology History  Anal cancer ([place])  01/10/2019 Initial Diagnosis   Anal cancer ([place])   01/16/2019 Cancer Staging   Staging form: Anus, AJCC 8th Edition - Clinical: Stage Unknown (cTX, cN0, cM0) - Signed by Derek Jack, MD on 01/16/2019   01/27/2019 -  Chemotherapy   The patient had palonosetron (ALOXI) injection 0.25 mg, 0.25 mg, Intravenous,  Once, 0 of 1 cycle mitoMYcin (MUTAMYCIN) chemo injection 19.5 mg, 10 mg/m2, Intravenous,  Once, 0 of 1 cycle fluorouracil (ADRUCIL) 7,900 mg in sodium chloride 0.9 % 92 mL chemo infusion, 1,000 mg/m2/day, Intravenous, 4D (96 hours ), 0 of 1 cycle  for chemotherapy treatment.       CANCER STAGING: Cancer Staging Anal cancer ([place]) Staging form: Anus, AJCC 8th Edition - Clinical: Stage Unknown (cTX, cN0, cM0) - Signed by Derek Jack, MD on 01/16/2019    INTERVAL HISTORY:  Amber Williams 43 y.o. female seen for follow-up for anus squamous cell carcinoma.  She had PET CT scan done on 01/15/2019.  Reports some constipation and mild fatigue.  Pain in the buttock region is rated as 3 out of 10.  Denies any nausea, vomiting or diarrhea. Appetite is 50%.  Energy levels are 25%.  Denies any fevers or chills.   REVIEW OF SYSTEMS:  Review of Systems  Gastrointestinal: Positive for constipation.  All other systems reviewed and are negative.    PAST MEDICAL/SURGICAL HISTORY:  Past Medical History:  Diagnosis Date  . Allergy   . Anal cancer ([facility]) 01/10/2019  . Arthritis   . Blood transfusion without reported diagnosis    2013  . Depression   . DVT (deep venous thrombosis) ([facility]) 2013  . Gait abnormality 08/03/2016  . Hyperlipidemia   .  Hypertension   . Hypothyroidism   . Multiple sclerosis ([place])   . Neurogenic bladder 08/03/2016  . Neuromuscular disorder ([facility])    dx'd with MS at age of 63  . PTSD (post-traumatic stress disorder)   . Right foot drop 08/03/2016  . Thyroid disease   . Ulcer    Past Surgical History:  Procedure Laterality Date  . COLON SURGERY     Colonoscopy  . GASTRIC BYPASS  2010  . HEMORRHOID SURGERY N/A 12/25/2018   Procedure: EXTENSIVE HEMORRHOIDECTOMY;  Surgeon: Aviva Signs, MD;  Location: [facility];  Service: General;  Laterality: N/A;  . INTERSTIM IMPLANT PLACEMENT  2014  . PORTACATH PLACEMENT       SOCIAL HISTORY:  Social History   Socioeconomic History  . Marital status: Single    Spouse name: Ricky  . Number of children: 0  . Years of education: 42  . Highest education level: Associate degree: occupational, Hotel manager, or vocational program  Occupational History  . Occupation: Disability  Social Needs  . Financial resource strain: Not hard at all  . Food insecurity    Worry: Never true    Inability: Never true  . Transportation needs    Medical: No    Non-medical: No  Tobacco Use  . Smoking status: Former Smoker    Packs/day: 1.50    Years: 15.00    Pack years: 22.50    Types: Cigarettes    Start date: 1993  Quit date: 01/30/2009    Years since quitting: 9.9  . Smokeless tobacco: Never Used  Substance and Sexual Activity  . 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 Drug use: No  . Sexual activity: Yes  Lifestyle  . Physical activity    Days per week: 2 days    Minutes per session: 30 min  . Stress: Not at all  Relationships  . Social connections    Talks on phone: More than three times a week    Gets together: More than three times a week    Attends religious service: Never    Active member of club or organization: No    Attends meetings of clubs or organizations: Never    Relationship status: Living with partner  . Intimate partner violence     Fear of current or ex partner: No    Emotionally abused: No    Physically abused: No    Forced sexual activity: No  Other Topics Concern  . Not on file  Social History Narrative   Lives at home with her boyfriend   Right-handed   Caffeine: 2 glasses per day    FAMILY HISTORY:  Family History  Problem Relation Age of Onset  . Cancer Mother        Dx'd at age 11  . Breast cancer Mother   . Hypertension Father   . Diabetes Father   . Hypertension Sister   . Miscarriages / Stillbirths Sister   . Heart disease Maternal Grandmother   . Diabetes Maternal Grandmother   . Heart disease Maternal Grandfather   . Diabetes Paternal Grandmother   . Heart disease Paternal Grandfather   . Stroke Paternal Grandfather     CURRENT MEDICATIONS:  Outpatient Encounter Medications as of 01/16/2019  Medication Sig  . ALLERGY RELIEF 180 MG tablet Take 180 mg by mouth daily as needed for allergies.   [name] amitriptyline (ELAVIL) 50 MG tablet Take 1 tablet (50 mg total) by mouth at bedtime.  . Cholecalciferol (VITAMIN D3) 5000 units CAPS Take 5,000 Units by mouth every morning.  . docusate sodium (STOOL SOFTENER) 100 MG capsule Take 100 mg by mouth 2 (two) times daily.   . ferrous fumarate (HEMOCYTE - 106 MG FE) 325 (106 Fe) MG TABS tablet Take 325 mg by mouth 2 (two) times daily.   . flurazepam (DALMANE) 30 MG capsule Take 1 capsule (30 mg total) by mouth at bedtime.  [name] HYDROcodone-acetaminophen (NORCO) 10-325 MG tablet Take 1 tablet by mouth at bedtime as needed.  [name] levonorgestrel-ethinyl estradiol (NORDETTE) 0.15-30 MG-MCG tablet Take 1 tablet by mouth daily.  [name] levothyroxine (SYNTHROID) 100 MCG tablet Take 1 tablet (100 mcg total) by mouth daily.  . mometasone (NASONEX) 50 MCG/ACT nasal spray Place 2 sprays into the nose 2 (two) times daily as needed (allergies).   Wess Botts (OCREVUS IV) Inject into the vein every 6 (six) months.   [name] omeprazole (PRILOSEC) 20 MG capsule Take 1 capsule (20 mg  total) by mouth daily.  . vitamin B-12 (CYANOCOBALAMIN) 500 MCG tablet Take 500 mcg by mouth daily.  . hydrocortisone (ANUSOL-HC) 2.5 % rectal cream Place 1 application rectally 2 (two) times daily. (Patient not taking: Reported on 01/10/2019)  . loperamide (IMODIUM) 1 MG/5ML solution Take 6 mg by mouth as needed for diarrhea or loose stools.    No facility-administered encounter medications on file as of 01/16/2019.     ALLERGIES:  Allergies  Allergen Reactions  .  Other Hives    Plastic Tape  . Tecfidera [Dimethyl Fumarate] Itching     PHYSICAL EXAM:  ECOG Performance status: 1  Vitals:   01/16/19 0958  BP: (!) 112/47  Pulse: 88  Resp: 14  Temp: (!) 97.5 F (36.4 C)  SpO2: 100%   Filed Weights   01/16/19 0958  Weight: 187 lb (84.8 kg)    Physical Exam Vitals signs reviewed.  Constitutional:      Appearance: Normal appearance.  Cardiovascular:     Rate and Rhythm: Normal rate and regular rhythm.     Heart sounds: Normal heart sounds.  Pulmonary:     Breath sounds: Normal breath sounds.  Abdominal:     General: There is no distension.     Palpations: Abdomen is soft. There is no mass.  Musculoskeletal:        General: No swelling.  Skin:    General: Skin is warm.  Neurological:     General: No focal deficit present.     Mental Status: She is alert and oriented to person, place, and time.  Psychiatric:        Mood and Affect: Mood normal.        Behavior: Behavior normal.      LABORATORY DATA:  I have reviewed the labs as listed.  CBC    Component Value Date/Time   WBC 10.8 12/04/2018 1427   RBC 3.99 12/04/2018 1427   HGB 10.2 (L) 12/04/2018 1427   HCT 34.4 12/04/2018 1427   PLT 453 (H) 12/04/2018 1427   MCV 86 12/04/2018 1427   MCH 25.6 (L) 12/04/2018 1427   MCHC 29.7 (L) 12/04/2018 1427   RDW 14.3 12/04/2018 1427   LYMPHSABS 1.4 12/04/2018 1427   EOSABS 0.1 12/04/2018 1427   BASOSABS 0.1 12/04/2018 1427   CMP Latest Ref Rng & Units 12/04/2018  11/13/2018 07/10/2018  Glucose 65 - 99 mg/dL 66 70 77  BUN 6 - 24 mg/dL 8 9 10   Creatinine 0.57 - 1.00 mg/dL 0.76 0.87 0.85  Sodium 134 - 144 mmol/L 140 139 145(H)  Potassium 3.5 - 5.2 mmol/L 4.2 4.2 4.6  Chloride 96 - 106 mmol/L 105 104 106  CO2 20 - 29 mmol/L 23 21 24   Calcium 8.7 - 10.2 mg/dL 9.3 10.0 9.3  Total Protein 6.0 - 8.5 g/dL 5.1(L) 5.6(L) 5.4(L)  Total Bilirubin 0.0 - 1.2 mg/dL <0.2 <0.2 <0.2  Alkaline Phos 39 - 117 IU/L 94 103 108  AST 0 - 40 IU/L 14 15 17   ALT 0 - 32 IU/L 11 13 17        DIAGNOSTIC IMAGING:  I have independently reviewed the scans and discussed with the patient.     ASSESSMENT & PLAN:   Anal cancer ([place]) 1.  Anal squamous cell carcinoma: - Presentation with worsening bleeding per rectum, felt a mass in the perianal region.  History of hemorrhoids since age 67. - Seen by Dr. 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  P 16 was positive. - Colonoscopy 5 years ago in [facility], reportedly normal. - Physical exam did not reveal any mass in the anal canal.  There is an ulcerated area in the anterior anal orifice with thickening.  No inguinal adenopathy. - PET CT scan on 01/15/2019 shows increased radiotracer uptake localized to the soft tissues of the anus.  No findings to suggest nodal metastasis. - I have recommended chemoradiation therapy with  5-FU and mitomycin.  We reviewed the regimen and side effects in detail.  We also reviewed the prognosis. - I will make a referral to [facility].  She already has a port in place.  2.  Multiple sclerosis: -She had multiple sclerosis of several years duration, resulting in right-sided weakness, fatigue, memory issues and headaches.  She walks with a cane and walker. - She has been treated with multiple agents including Tecfidera, Betaseron, Avonex, Copaxone.  She also received mitoxantrone in the  past. -She is currently on Ocrevus every 6 months.   Total time spent is 40 minutes with more than 50% of the time spent face-to-face discussing treatment plan, counseling and coordination of care.  Orders placed this encounter:  Orders Placed This Encounter  Procedures  . CBC with Differential/Platelet  . Comprehensive metabolic panel      Derek Jack, MD [place] [phone]

## 2019-01-16 NOTE — Assessment & Plan Note (Signed)
1.  Anal squamous cell carcinoma: - Presentation with worsening bleeding per rectum, felt a mass in the perianal region.  History of hemorrhoids since age 43. - Seen by Dr. 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  P 16 was positive. - Colonoscopy 5 years ago in [facility], reportedly normal. - Physical exam did not reveal any mass in the anal canal.  There is an ulcerated area in the anterior anal orifice with thickening.  No inguinal adenopathy. - PET CT scan on 01/15/2019 shows increased radiotracer uptake localized to the soft tissues of the anus.  No findings to suggest nodal metastasis. - I have recommended chemoradiation therapy with 5-FU and mitomycin.  We reviewed the regimen and side effects in detail.  We also reviewed the prognosis. - I will make a referral to [facility].  She already has a port in place.  2.  Multiple sclerosis: -She had multiple sclerosis of several years duration, resulting in right-sided weakness, fatigue, memory issues and headaches.  She walks with a cane and walker. - She has been treated with multiple agents including Tecfidera, Betaseron, Avonex, Copaxone.  She also received mitoxantrone in the past. -She is currently on Ocrevus every 6 months.

## 2019-01-16 NOTE — Progress Notes (Signed)
START ON PATHWAY REGIMEN - Anal Carcinoma     Chemotherapy concurrent with RT:     Mitomycin      Fluorouracil   **Always confirm dose/schedule in your pharmacy ordering system**  Patient Characteristics: Anal and Anal Margin Tumors, Newly Diagnosed - Locoregional Disease Not Amenable to Local Excision AJCC T Category: TX AJCC N Category: N0 AJCC M Category: M0 AJCC 8 Stage Grouping: Unknown Current Disease Status: Newly Diagnosed - Locoregional Disease Not Amendable to Local Excision Intent of Therapy: Curative Intent, Discussed with Patient

## 2019-01-16 NOTE — Progress Notes (Signed)
I met with patient during the visit with Dr. Delton Coombes. She was present with her mother.  She was given written education material on the two medications presented by Dr. Delton Coombes.  They were given the opportunity to ask questions and all were answered to their satisfaction.

## 2019-01-17 ENCOUNTER — Encounter (HOSPITAL_COMMUNITY): Payer: Self-pay | Admitting: Lab

## 2019-01-17 ENCOUNTER — Ambulatory Visit (HOSPITAL_COMMUNITY): Admission: RE | Admit: 2019-01-17 | Payer: Medicare HMO | Source: Ambulatory Visit

## 2019-01-17 NOTE — Progress Notes (Unsigned)
Referral sent to [facility].  Records faxed on 9/18

## 2019-01-20 ENCOUNTER — Encounter: Payer: Self-pay | Admitting: Family Medicine

## 2019-01-20 ENCOUNTER — Ambulatory Visit (HOSPITAL_COMMUNITY): Payer: Medicare HMO | Admitting: Hematology

## 2019-01-21 ENCOUNTER — Encounter (HOSPITAL_COMMUNITY): Payer: Self-pay | Admitting: General Practice

## 2019-01-21 ENCOUNTER — Inpatient Hospital Stay (HOSPITAL_COMMUNITY): Payer: Medicare HMO | Admitting: General Practice
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
## 2019-01-22 ENCOUNTER — Other Ambulatory Visit (HOSPITAL_COMMUNITY): Payer: Self-pay | Admitting: *Deleted

## 2019-01-22 DIAGNOSIS — Z0000 Encounter for general adult medical examination without abnormal findings: Secondary | ICD-10-CM

## 2019-01-22 NOTE — Progress Notes (Signed)
I was asked by Edwyna Shell, CSW to place a referral to [facility] for Medications Management/psychiatry for this patient.  "She really needs to have meds managed by psychiatrist... lots of anxiety and a complex history. She was open to this option.  Tx [facility]"  Orders placed.

## 2019-01-23 ENCOUNTER — Inpatient Hospital Stay (HOSPITAL_COMMUNITY): Payer: Medicare HMO

## 2019-01-23 ENCOUNTER — Encounter (HOSPITAL_COMMUNITY): Payer: Self-pay

## 2019-01-23 ENCOUNTER — Other Ambulatory Visit: Payer: Self-pay

## 2019-01-23 DIAGNOSIS — Z95828 Presence of other vascular implants and grafts: Secondary | ICD-10-CM | POA: Insufficient documentation

## 2019-01-23 DIAGNOSIS — C210 Malignant neoplasm of anus, unspecified: Secondary | ICD-10-CM

## 2019-01-23 HISTORY — DX: Presence of other vascular implants and grafts: Z95.828

## 2019-01-23 MED ORDER — PROCHLORPERAZINE MALEATE 10 MG PO TABS: 10.0000 mg | ORAL_TABLET | Freq: Four times a day (QID) | ORAL | 1 refills | Status: DC | PRN

## 2019-01-23 MED ORDER — LIDOCAINE-PRILOCAINE 2.5-2.5 % EX CREA: TOPICAL_CREAM | CUTANEOUS | 3 refills | Status: DC

## 2019-01-23 NOTE — Progress Notes (Signed)

## 2019-01-23 NOTE — Patient Instructions (Addendum)
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  You will be treated on Days 1 and 28 with two chemotherapy drugs -  Mitomycin and fluorouracil.  You will receive your mitomycin injection through your port on Monday (Day 1), then you will also be connected to a continuous portable infusion pump through which you'll receive fluorouracil.  You will be placed on the pump on Monday (Day 1) and the pump will be removed on Friday (Day 4) once the infusion is complete. This process will be repeated on Day 28. This treatment will be in conjunction with radiation therapy.  The intent of treatment is cure.  You will see the doctor regularly throughout treatment.  We will obtain blood work from you prior to every treatment and monitor your results to make sure it is safe to give your treatment. The doctor monitors your response to treatment by the way you are feeling, your blood work, and by obtaining scans periodically.  There will be wait times while you are here for treatment.  It will take about 30 minutes to 1 hour for your lab work to result.  Then there will be wait times while pharmacy mixes your medications.    Mitomycin  About This Drug Mitomycin is used to treat cancer. It is given in the vein (IV).  Possible Side Effects . Bone marrow suppression. This is a decrease in the number of white blood cells, red blood cells, and platelets. This may raise your risk of infection, make you tired and weak (fatigue), and raise your risk of bleeding. . Fever . Soreness of the mouth and throat. You may have red areas, white patches, or sores that hurt. . Nausea and vomiting (throwing up) . Decreased appetite (decreased hunger) . Skin and tissue irritation including redness, pain, warmth, or swelling at the IV site if the drug leaks out of the vein and into nearby tissue. Very rarely it may cause local tissue necrosis (death). . Hair loss. Hair loss is  often temporary, although with certain medicine, hair loss can sometimes be permanent. Hair loss may happen suddenly or gradually. If you lose hair, you may lose it from your head, face, armpits, pubic area, chest, and/or legs. You may also notice your hair getting thin.  Note: Not all possible side effects are included above.  Warnings and Precautions . Severe bone marrow suppression . Changes in your kidney function . Hemolytic uremic syndrome can occur. This is a syndrome that affects your red blood cells, platelets and blood vessels in your kidneys and can cause kidney failure and be life-threatening. . Inflammation (swelling) of the lungs. You may have a dry cough or trouble breathing  Note: Some of the side effects above are very rare. If you have concerns and/or questions, please discuss them with your medical team.  Important Information . This drug may be present in the saliva, tears, sweat, urine, stool, vomit, semen, and vaginal secretions. Talk to your doctor and/or your nurse about the necessary precautions to take during this time.  Treating Side Effects . Manage tiredness by pacing your activities for the day. . Be sure to include periods of rest between energy-draining activities. . To decrease the risk of infection, wash your hands regularly. . Avoid close contact with people who have a cold, the flu, or other infections. . Take your temperature as your doctor or nurse tells you, and whenever you feel like you may have a  fever. . To help decrease the risk of bleeding, use a soft toothbrush. Check with your nurse before using dental floss. . Be very careful when using knives or tools. . Use an electric shaver instead of a razor. . Mouth care is very important. Your mouth care should consist of routine, gentle cleaning of your teeth or dentures and rinsing your mouth with a mixture of 1/2 teaspoon of salt in 8 ounces of water or 1/2 teaspoon of baking soda in 8 ounces of  water. This should be done at least after each meal and at bedtime. . If you have mouth sores, avoid mouthwash that has alcohol. Also avoid alcohol and smoking because they can bother your mouth and throat. . Drink plenty of fluids (a minimum of eight glasses per day is recommended). More may be recommended by your doctor. . If you throw up or have loose bowel movements, you should drink more fluids so that you do not become dehydrated (lack of water in the body from losing too much fluid). . To help with nausea and vomiting, eat small, frequent meals instead of three large meals a day. Choose foods and drinks that are at room temperature. Ask your nurse or doctor about other helpful tips and medicine that is available to help stop or lessen these symptoms. . To help with decreased appetite, eat small, frequent meals. Eat foods high in calories and protein, such as meat, poultry, fish, dry beans, tofu, eggs, nuts, milk, yogurt, cheese, ice cream, pudding, and nutritional supplements. . Consider using sauces and spices to increase taste. Daily exercise, with your doctor's approval, may increase your appetite. . To help with hair loss, wash with a mild shampoo and avoid washing your hair every day. . Avoid rubbing your scalp, pat your hair or scalp dry. . Avoid coloring your hair. . Limit your use of hair spray, electric curlers, blow dryers, and curling irons.   Food and Drug Interactions . There are no known interactions of mitomycin with food. . This drug may interact with other medicines. Tell your doctor and pharmacist about all the prescription and over-the-counter medicines and dietary supplements (vitamins, minerals, herbs and others) that you are taking at this time. Also, check with your doctor or pharmacist before starting any new prescription or over-the-counter medicines, or dietary supplements to make sure that there are no interactions.  When to Call the Doctor  Call your doctor  or nurse if you have any of these symptoms and/or any new or unusual symptoms: . Fever of 100.4 F (38 C) or higher . Chills . Tiredness that interferes with your daily activities . Feeling dizzy or lightheaded . Easy bleeding or bruising . Confusion . Changes in mood . Trouble breathing . Pain in your chest or abdomen . Cough . Nausea that stops you from eating or drinking and/or is not relieved by prescribed medicines . Throwing up more than 3 times a day . Lasting loss of appetite or rapid weight loss of five pounds in a week . Pain in your mouth or throat that makes it hard to eat or drink . Decreased urine or very dark urine . While you are getting this drug, please tell your nurse right away if you have any pain, redness, or swelling at the site of the IV infusion. . If you think you may be pregnant  Reproduction Warnings . Pregnancy warning: It is not known if this drug may harm an unborn child. For this reason, be sure  to talk with your doctor if you are pregnant or planning to become pregnant while receiving this drug. Let your doctor know right away if you think you may be pregnant or may have impregnated your partner. . Breastfeeding warning: It is not known if this drug passes into breast milk. For this reason, women should talk to their doctor about the risks and benefits of breastfeeding during treatment with this drug because this drug may enter the breast milk and cause harm to a breastfeeding baby. . Fertility warning: Human fertility studies have not been done with this drug. Talk with your doctor or nurse if you plan to have children. Ask for information on sperm or egg banking.   5-Fluorouracil (Adrucil; 5FU)  About This Drug  Fluorouracil is used to treat cancer. It is given in the vein (IV). It is given as an IV push from a syringe and also as a continuous infusion given via an ambulatory pump (a pump you take home and wear for a specified amount of  time).  Possible Side Effects  . Bone marrow suppression. This is a decrease in the number of white blood cells, red blood cells, and platelets. This may raise your risk of infection, make you tired and weak (fatigue), and raise your risk of bleeding  . Changes in the tissue of the heart and/or heart attack. Some changes may happen that can cause your heart to have less ability to pump blood.  . Blurred vision or other changes in eyesight  . Nausea and throwing up (vomiting)  . Diarrhea (loose bowel movements)  . Ulcers - sores that may cause pain or bleeding in your digestive tract, which includes your mouth, esophagus, stomach, small/large intestines and rectum  . Soreness of the mouth and throat. You may have red areas, white patches, or sores that hurt.  . Allergic reactions, including anaphylaxis are rare but may happen in some patients. Signs of allergic reaction to this drug may be swelling of the face, feeling like your tongue or throat are swelling, trouble breathing, rash, itching, fever, chills, feeling dizzy, and/or feeling that your heart is beating in a fast or not normal way. If this happens, do not take another dose of this drug. You should get urgent medical treatment.  . Sensitivity to light (photosensitivity). Photosensitivity means that you may become more sensitive to the sun and/or light. You may get a skin rash/reaction if you are in the sun or are exposed to sun lamps and tanning beds. Your eyes may water more, mostly in bright light.  . Changes in your nail color, nail loss and/or brittle nail  . Darkening of the skin, or changes to the color of your skin and/or veins used for infusion  . Rash, dry skin, or itching  Note: Not all possible side effects are included above.  Warnings and Precautions  . Hand-and-foot syndrome. The palms of your hands or soles of your feet may tingle, become numb, painful, swollen, or red.  . Changes in your central  nervous system can happen. The central nervous system is made up of your brain and spinal cord. You could feel extreme tiredness, agitation, confusion, hallucinations (see or hear things that are not there), trouble understanding or speaking, loss of control of your bowels or bladder, eyesight changes, numbness or lack of strength to your arms, legs, face, or body, or coma. If you start to have any of these symptoms let your doctor know right away.  . Side effects  of this drug may be unexpectedly severe in some patients  Note: Some of the side effects above are very rare. If you have concerns and/or questions, please discuss them with your medical team.  Important Information  . This drug may be present in the saliva, tears, sweat, urine, stool, vomit, semen, and vaginal secretions. Talk to your doctor and/or your nurse about the necessary precautions to take during this time.  Treating Side Effects  . Manage tiredness by pacing your activities for the day.  . Be sure to include periods of rest between energy-draining activities.  . To help decrease the risk of infections, wash your hands regularly.  . Avoid close contact with people who have a cold, the flu, or other infections.  . Take your temperature as your doctor or nurse tells you, and whenever you feel like you may have a fever.  . Use a soft toothbrush. Check with your nurse before using dental floss.  . Be very careful when using knives or tools.  . Use an electric shaver instead of a razor.  . If you have a nose bleed, sit with your head tipped slightly forward. Apply pressure by lightly pinching the bridge of your nose between your thumb and forefinger. Call your doctor if you feel dizzy or faint or if the bleeding doesn't stop after 10 to 15 minutes.  . Drink plenty of fluids (a minimum of eight glasses per day is recommended).  . If you throw up or have loose bowel movements, you should drink more fluids so  that you do not  become dehydrated (lack of water in the body from losing too much fluid).  . To help with nausea and vomiting, eat small, frequent meals instead of three large meals a day. Choose foods and drinks that are at room temperature. Ask your nurse or doctor about other helpful tips and medicine that is available to help, stop, or lessen these symptoms.  . If you have diarrhea, eat low-fiber foods that are high in protein and calories and avoid foods that can irritate your digestive tracts or lead to cramping.  . Ask your nurse or doctor about medicine that can lessen or stop your diarrhea.  . Mouth care is very important. Your mouth care should consist of routine, gentle cleaning of your teeth or dentures and rinsing your mouth with a mixture of 1/2 teaspoon of salt in 8 ounces of water or 1/2 teaspoon of baking soda in 8 ounces of water. This should be done at least after each meal and at bedtime.  . If you have mouth sores, avoid mouthwash that has alcohol. Also avoid alcohol and smoking because they can bother your mouth and throat.  [name] Keeping your nails moisturized may help with brittleness.  . To help with itching, moisturize your skin several times day.  . Use sunscreen with SPF 30 or higher when you are outdoors even for a short time. Cover up when you are out in the sun. Wear wide-brimmed hats, long-sleeved shirts, and pants. Keep your neck, chest, and back covered. Wear dark sun glasses when in the sun or bright lights.  . If you get a rash do not put anything on it unless your doctor or nurse says you may. Keep the area around the rash clean and dry. Ask your doctor for medicine if your rash bothers you.  [name] Keeping your pain under control is important to your well-being. Please tell your doctor or nurse if you are  experiencing pain.  Food and Drug Interactions  . There are no known interactions of fluorouracil with food.  . Check with your doctor or  pharmacist about all other prescription medicines and over-the-counter medicines and dietary supplements (vitamins, minerals, herbs and others) you are taking before starting this medicine as there are known drug interactions with 5-fluoroucacil. Also, check with your doctor or pharmacist before starting any new prescription or over-the-counter medicines, or dietary supplements to make sure that there are no interactions.  When to Call the Doctor  Call your doctor or nurse if you have any of these symptoms and/or any new or unusual symptoms:  . Fever of 100.4 F (38 C) or higher  . Chills  . Easy bleeding or bruising  . Nose bleed that doesn't stop bleeding after 10-15 minutes  . Trouble breathing  . Feeling dizzy or lightheaded  . Feeling that your heart is beating in a fast or not normal way (palpitations)  . Chest pain or symptoms of a heart attack. Most heart attacks involve pain in the center of the chest that lasts more than a few minutes. The pain may go away and come back or it can be constant. It can feel like pressure, squeezing, fullness, or pain. Sometimes pain is felt in one or both arms, the back, neck, jaw, or stomach. If any of these symptoms last 2 minutes, call 911.  [name] Confusion and/or agitation  . Hallucinations  . Trouble understanding or speaking  . Loss of control of bowels or bladder  . Blurry vision or changes in your eyesight  . Headache that does not go away  . Numbness or lack of strength to your arms, legs, face, or body  . Nausea that stops you from eating or drinking and/or is not relieved by prescribed medicines  . Throwing up more than 3 times a day  . Diarrhea, 4 times in one day or diarrhea with lack of strength or a feeling of being dizzy  . Pain in your mouth or throat that makes it hard to eat or drink  . Pain along the digestive tract - especially if worse after eating  . Blood in your vomit (bright red or coffee-ground)  and/or stools (bright red, or black/tarry)  . Coughing up blood  . Tiredness that interferes with your daily activities  . Painful, red, or swollen areas on your hands or feet or around your nails  . A new rash or a rash that is not relieved by prescribed medicines  . Develop sensitivity to sunlight/light  . Numbness and/or tingling of your hands and/or feet  . Signs of allergic reaction: swelling of the face, feeling like your tongue or throat are swelling, trouble breathing, rash, itching, fever, chills, feeling dizzy, and/or feeling that your heart is beating in a fast or not normal way. If this happens, call 911 for emergency care.  . If you think you are pregnant or may have impregnated your partner  Reproduction Warnings  . Pregnancy warning: This drug may have harmful effects on the unborn baby. Women of child bearing potential should use effective methods of birth control during your cancer treatment and 3 months after treatment. Men with female partners of childbearing potential should use effective methods of birth control during your cancer treatment and for 3 months after your cancer treatment. Let your doctor know right away if you think you may be pregnant or may have impregnated your partner.  . Breastfeeding warning: It is not  known if this drug passes into breast milk. For this reason, Women should not breastfeed during treatment because this drug could enter the breast milk and cause harm to a breastfeeding baby.  . Fertility warning: In men and women both, this drug may affect your ability to have children in the future. Talk with your doctor or nurse if you plan to have children. Ask for information on sperm or egg banking.  SELF CARE ACTIVITIES WHILE RECEIVING CHEMOTHERAPY:  Hydration Increase your fluid intake 48 hours prior to treatment and drink at least 8 to 12 cups (64 ounces) of water/decaffeinated beverages per day after treatment. You can still  have your cup of coffee or soda but these beverages do not count as part of your 8 to 12 cups that you need to drink daily. No alcohol intake.  Medications Continue taking your normal prescription medication as prescribed.  If you start any new herbal or new supplements please let us know first to make sure it is safe.  Mouth Care Have teeth cleaned professionally before starting treatment. Keep dentures and partial plates clean. Use soft toothbrush and do not use mouthwashes that contain alcohol. Biotene is a good mouthwash that is available at most pharmacies or may be ordered by calling [phone]. Use warm salt water gargles (1 teaspoon salt per 1 quart warm water) before and after meals and at bedtime. If you need dental work, please let the doctor know before you go for your appointment so that we can coordinate the best possible time for you in regards to your chemo regimen. You need to also let your dentist know that you are actively taking chemo. We may need to do labs prior to your dental appointment.  Skin Care Always use sunscreen that has not expired and with SPF (Sun Protection Factor) of 50 or higher. Wear hats to protect your head from the sun. Remember to use sunscreen on your hands, ears, face, & feet.  Use good moisturizing lotions such as udder cream, eucerin, or even Vaseline. Some chemotherapies can cause dry skin, color changes in your skin and nails.    . Avoid long, hot showers or baths. . Use gentle, fragrance-free soaps and laundry detergent. . Use moisturizers, preferably creams or ointments rather than lotions because the thicker consistency is better at preventing skin dehydration. Apply the cream or ointment within 15 minutes of showering. Reapply moisturizer at night, and moisturize your hands every time after you wash them.  Hair Loss (if your doctor says your hair will fall out)  . If your doctor says that your hair is likely to fall out, decide before you begin  chemo whether you want to wear a wig. You may want to shop before treatment to match your hair color. . Hats, turbans, and scarves can also camouflage hair loss, although some people prefer to leave their heads uncovered. If you go bare-headed outdoors, be sure to use sunscreen on your scalp. . Cut your hair short. It eases the inconvenience of shedding lots of hair, but it also can reduce the emotional impact of watching your hair fall out. . Don't perm or color your hair during chemotherapy. Those chemical treatments are already damaging to hair and can enhance hair loss. Once your chemo treatments are done and your hair has grown back, it's OK to resume dyeing or perming hair.  With chemotherapy, hair loss is almost always temporary. But when it grows back, it may be a different color or texture.  In older adults who still had hair color before chemotherapy, the new growth may be completely gray.  Often, new hair is very fine and soft.  Infection Prevention Please wash your hands for at least 30 seconds using warm soapy water. Handwashing is the #1 way to prevent the spread of germs. Stay away from sick people or people who are getting over a cold. If you develop respiratory systems such as green/yellow mucus production or productive cough or persistent cough let us know and we will see if you need an antibiotic. It is a good idea to keep a pair of gloves on when going into grocery stores/[employer] to decrease your risk of coming into contact with germs on the carts, etc. Carry alcohol hand gel with you at all times and use it frequently if out in public. If your temperature reaches 100.5 or higher please call the clinic and let us know.  If it is after hours or on the weekend please go to the ER if your temperature is over 100.5.  Please have your own personal thermometer at home to use.    Sex and bodily fluids If you are going to have sex, a condom must be used to protect the person that isn't taking  chemotherapy. Chemo can decrease your libido (sex drive). For a few days after chemotherapy, chemotherapy can be excreted through your bodily fluids.  When using the toilet please close the lid and flush the toilet twice.  Do this for a few day after you have had chemotherapy.   Effects of chemotherapy on your sex life Some changes are simple and won't last long. They won't affect your sex life permanently.  Sometimes you may feel: . too tired . not strong enough to be very active . sick or sore  . not in the mood . anxious or low Your anxiety might not seem related to sex. For example, you may be worried about the cancer and how your treatment is going. Or you may be worried about money, or about how you family are coping with your illness. These things can cause stress, which can affect your interest in sex. It's important to talk to your partner about how you feel. Remember - the changes to your sex life don't usually last long. There's usually no medical reason to stop having sex during chemo. The drugs won't have any long term physical effects on your performance or enjoyment of sex. Cancer can't be passed on to your partner during sex  Contraception It's important to use reliable contraception during treatment. Avoid getting pregnant while you or your partner are having chemotherapy. This is because the drugs may harm the baby. Sometimes chemotherapy drugs can leave a man or woman infertile.  This means you would not be able to have children in the future. You might want to talk to someone about permanent infertility. It can be very difficult to learn that you may no longer be able to have children. Some people find counselling helpful. There might be ways to preserve your fertility, although this is easier for men than for women. You may want to speak to a fertility expert. You can talk about sperm banking or harvesting your eggs. You can also ask about other fertility options, such as donor  eggs. If you have or have had breast cancer, your doctor might advise you not to take the contraceptive pill. This is because the hormones in it might affect the cancer.  It is not known  for sure whether or not chemotherapy drugs can be passed on through semen or secretions from the vagina. Because of this some doctors advise people to use a barrier method if you have sex during treatment. This applies to vaginal, anal or oral sex. Generally, doctors advise a barrier method only for the time you are actually having the treatment and for about a week after your treatment. Advice like this can be worrying, but this does not mean that you have to avoid being intimate with your partner. You can still have close contact with your partner and continue to enjoy sex.  Animals If you have cats or birds we just ask that you not change the litter or change the cage.  Please have someone else do this for you while you are on chemotherapy.   Food Safety During and After Cancer Treatment Food safety is important for people both during and after cancer treatment. Cancer and cancer treatments, such as chemotherapy, radiation therapy, and stem cell/bone marrow transplantation, often weaken the immune system. This makes it harder for your body to protect itself from foodborne illness, also called food poisoning. Foodborne illness is caused by eating food that contains harmful bacteria, parasites, or viruses.  Foods to avoid Some foods have a higher risk of becoming tainted with bacteria. These include: [name] Unwashed fresh fruit and vegetables, especially leafy vegetables that can hide dirt and other contaminants . Raw sprouts, such as alfalfa sprouts . Raw or undercooked beef, especially ground beef, or other raw or undercooked meat and poultry . Fatty, fried, or spicy foods immediately before or after treatment.  These can sit heavy on your stomach and make you feel nauseous. . Raw or undercooked shellfish, such as  oysters. . Sushi and sashimi, which often contain raw fish.  . Unpasteurized beverages, such as unpasteurized fruit juices, raw milk, raw yogurt, or cider . Undercooked eggs, such as soft boiled, over easy, and poached; raw, unpasteurized eggs; or foods made with raw egg, such as homemade raw cookie dough and homemade mayonnaise  Simple steps for food safety  Shop smart. . Do not buy food stored or displayed in an unclean area. . Do not buy bruised or damaged fruits or vegetables. . Do not buy cans that have cracks, dents, or bulges. . Pick up foods that can spoil at the end of your shopping trip and store them in a cooler on the way home.  Prepare and clean up foods carefully. . Rinse all fresh fruits and vegetables under running water, and dry them with a clean towel or paper towel. . Clean the top of cans before opening them. . After preparing food, wash your hands for 20 seconds with hot water and soap. Pay special attention to areas between fingers and under nails. . Clean your utensils and dishes with hot water and soap. [name] Disinfect your kitchen and cutting boards using 1 teaspoon of liquid, unscented bleach mixed into 1 quart of water.    Dispose of old food. . Eat canned and packaged food before its expiration date (the "use by" or "best before" date). . Consume refrigerated leftovers within 3 to 4 days. After that time, throw out the food. Even if the food does not smell or look spoiled, it still may be unsafe. Some bacteria, such as Listeria, can grow even on foods stored in the refrigerator if they are kept for too long.  Take precautions when eating out. . At restaurants, avoid buffets and salad bars where  food sits out for a long time and comes in contact with many people. Food can become contaminated when someone with a virus, often a norovirus, or another "bug" handles it. . Put any leftover food in a "to-go" container yourself, rather than having the server do it. And,  refrigerate leftovers as soon as you get home. . Choose restaurants that are clean and that are willing to prepare your food as you order it cooked.   AT HOME MEDICATIONS:                                                                                                                                                                Compazine/Prochlorperazine 10mg  tablet. Take 1 tablet every 6 hours as needed for nausea/vomiting. (This can make you sleepy)   EMLA cream. Apply a quarter size amount to port site 1 hour prior to chemo. Do not rub in. Cover with plastic wrap.    Diarrhea Sheet   If you are having loose stools/diarrhea, please purchase Imodium and begin taking as outlined:  At the first sign of poorly formed or loose stools you should begin taking Imodium (loperamide) 2 mg capsules.  Take two tablets (4mg ) followed by one tablet (2mg ) every 2 hours - DO NOT EXCEED 8 tablets in 24 hours.  If it is bedtime and you are having loose stools, take 2 tablets at bedtime, then 2 tablets every 4 hours until morning.   Always call the [facility] if you are having loose stools/diarrhea that you can't get under control.  Loose stools/diarrhea leads to dehydration (loss of water) in your body.  We have other options of trying to get the loose stools/diarrhea to stop but you must let us know!   Constipation Sheet  Colace - 100 mg capsules - take 2 capsules daily.  If this doesn't help then you can increase to 2 capsules twice daily.  Please call if the above does not work for you. Do not go more than 2 days without a bowel movement.  It is very important that you do not become constipated.  It will make you feel sick to your stomach (nausea) and can cause abdominal pain and vomiting.  Nausea Sheet   Compazine/Prochlorperazine 10mg  tablet. Take 1 tablet every 6 hours as needed for nausea/vomiting (This can make you drowsy).  If you are having persistent nausea (nausea that does not stop)  please call the [facility] and let us know the amount of nausea that you are experiencing.  If you begin to vomit, you need to call the [facility] and if it is the weekend and you have vomited more than one time and can't get it to stop-go to the Emergency Room.  Persistent nausea/vomiting can lead to dehydration (loss  of fluid in your body) and will make you feel very weak and unwell. Ice chips, sips of clear liquids, foods that are at room temperature, crackers, and toast tend to be better tolerated.    SYMPTOMS TO REPORT AS SOON AS POSSIBLE AFTER TREATMENT:  FEVER GREATER THAN 100.5 F  CHILLS WITH OR WITHOUT FEVER  NAUSEA AND VOMITING THAT IS NOT CONTROLLED WITH YOUR NAUSEA MEDICATION  UNUSUAL SHORTNESS OF BREATH  UNUSUAL BRUISING OR BLEEDING  TENDERNESS IN MOUTH AND THROAT WITH OR WITHOUT   PRESENCE OF ULCERS  URINARY PROBLEMS  BOWEL PROBLEMS  UNUSUAL RASH      Wear comfortable clothing and clothing appropriate for easy access to any Portacath or PICC line. Let us know if there is anything that we can do to make your therapy better!    What to do if you need assistance after hours or on the weekends: CALL [phone].  HOLD on the line, do not hang up.  You will hear multiple messages but at the end you will be connected with a nurse triage line.  They will contact the doctor if necessary.  Most of the time they will be able to assist you.  Do not call the hospital operator.      I have been informed and understand all of the instructions given to me and have received a copy. I have been instructed to call the clinic [phone] or my family physician as soon as possible for continued medical care, if indicated. I do not have any more questions at this time but understand that I may call the [facility] or the Patient Navigator at [phone] during office hours should I have questions or need assistance in obtaining follow-up care.

## 2019-01-29 ENCOUNTER — Ambulatory Visit (INDEPENDENT_AMBULATORY_CARE_PROVIDER_SITE_OTHER): Payer: Medicare HMO | Admitting: Family Medicine

## 2019-01-29 ENCOUNTER — Encounter: Payer: Self-pay | Admitting: Family Medicine

## 2019-01-29 DIAGNOSIS — F331 Major depressive disorder, recurrent, moderate: Secondary | ICD-10-CM

## 2019-01-29 DIAGNOSIS — C210 Malignant neoplasm of anus, unspecified: Secondary | ICD-10-CM

## 2019-01-29 MED ORDER — ESCITALOPRAM OXALATE 10 MG PO TABS: 10.0000 mg | ORAL_TABLET | Freq: Every day | ORAL | 1 refills | Status: DC

## 2019-01-29 NOTE — Progress Notes (Signed)
Subjective:    Patient ID: CHELCIE NIEDZIELSKI, female    DOB: 1975-10-21, 43 y.o.   MRN: [id number]   HPI: FAREEDA MONEY is a 43 y.o. female presenting for increasing anxiety. Recent dx of rectal Ca. Starting radiation and chemo next week. Using her port for IV that she will keep going 5 days at a time. Also will get radiation. Probably 5 weeks of daily radiation. Caught at an early stage.   Tearfully says I can't control my emotions. Feels she is a burden, a bother to everyone. Down on herself. Physically, mentally worn out. Boy friend left her as a result of all her ailments.    Depression screen [facility] 2/9 12/04/2018 11/22/2018 07/01/2018 06/18/2018 06/10/2018  Decreased Interest 0 0 1 2 3   Down, Depressed, Hopeless 0 0 1 2 2   PHQ - 2 Score 0 0 2 4 5   Altered sleeping - - 1 3 3   Tired, decreased energy - - 1 2 3   Change in appetite - - 1 2 3   Feeling bad or failure about yourself  - - 1 1 3   Trouble concentrating - - 0 2 3  Moving slowly or fidgety/restless - - 0 0 2  Suicidal thoughts - - 1 0 1  PHQ-9 Score - - 7 14 23      Relevant past medical, surgical, family and social history reviewed and updated as indicated.  Interim medical history since our last visit reviewed. Allergies and medications reviewed and updated.  ROS:  Review of Systems  Constitutional: Positive for activity change and appetite change.  HENT: Negative.   Eyes: Negative for visual disturbance.  Respiratory: Negative for shortness of breath.   Cardiovascular: Negative for chest pain.  Gastrointestinal: Negative for abdominal pain.  Musculoskeletal: Negative for arthralgias.  Psychiatric/Behavioral: Positive for dysphoric mood, sleep disturbance and suicidal ideas. Negative for self-injury. The patient is nervous/anxious.      Social History   Tobacco Use  Smoking Status Former Smoker  . Packs/day: 1.50  . Years: 15.00  . Pack years: 22.50  . Types: Cigarettes  . Start date: 42  . Quit date:  01/30/2009  . Years since quitting: 10.0  Smokeless Tobacco Never Used       Objective:     Wt Readings from Last 3 Encounters:  01/16/19 187 lb (84.8 kg)  01/10/19 185 lb 8 oz (84.1 kg)  01/02/19 187 lb (84.8 kg)     Exam deferred. Pt. Harbor[place] due to COVID 19. Phone visit performed.   Assessment & Plan:   1. Anal cancer ([place])   2. Moderate episode of recurrent major depressive disorder ([place])     Meds ordered this encounter  Medications  . escitalopram (LEXAPRO) 10 MG tablet    Sig: Take 1 tablet (10 mg total) by mouth daily.    Dispense:  90 tablet    Refill:  1    No orders of the defined types were placed in this encounter.     Diagnoses and all orders for this visit:  Anal cancer ([place])  Moderate episode of recurrent major depressive disorder ([place])  Other orders -     escitalopram (LEXAPRO) 10 MG tablet; Take 1 tablet (10 mg total) by mouth daily.    Virtual Visit via telephone Note  I discussed the limitations, risks, security and privacy concerns of performing an evaluation and management service by telephone and the availability of in person appointments. The patient was identified with two identifiers.  Pt.expressed understanding and agreed to proceed. Pt. Is at home. Dr. Livia Snellen is in his office.  Follow Up Instructions:   I discussed the assessment and treatment plan with the patient. The patient was provided an opportunity to ask questions and all were answered. The patient agreed with the plan and demonstrated an understanding of the instructions.   The patient was advised to call back or seek an in-person evaluation if the symptoms worsen or if the condition fails to improve as anticipated.   Total minutes including chart review and phone contact time: 30   Follow up plan: Return in about 2 weeks (around 02/12/2019) for Depression, phone call due to chemo.  [name], MD [place]

## 2019-02-03 ENCOUNTER — Inpatient Hospital Stay (HOSPITAL_COMMUNITY): Payer: Medicare HMO

## 2019-02-03 ENCOUNTER — Encounter (HOSPITAL_COMMUNITY): Payer: Self-pay | Admitting: Hematology

## 2019-02-03 ENCOUNTER — Other Ambulatory Visit: Payer: Self-pay

## 2019-02-03 ENCOUNTER — Inpatient Hospital Stay (HOSPITAL_COMMUNITY): Payer: Medicare HMO | Attending: Hematology | Admitting: Hematology

## 2019-02-03 VITALS — BP 101/50 | HR 96 | Temp 97.7°F | Resp 16 | Wt 186.5 lb

## 2019-02-03 VITALS — BP 117/42 | HR 79 | Temp 97.1°F | Resp 18

## 2019-02-03 DIAGNOSIS — Z79899 Other long term (current) drug therapy: Secondary | ICD-10-CM | POA: Diagnosis not present

## 2019-02-03 DIAGNOSIS — C210 Malignant neoplasm of anus, unspecified: Secondary | ICD-10-CM

## 2019-02-03 DIAGNOSIS — R5383 Other fatigue: Secondary | ICD-10-CM | POA: Diagnosis not present

## 2019-02-03 DIAGNOSIS — K5900 Constipation, unspecified: Secondary | ICD-10-CM | POA: Diagnosis not present

## 2019-02-03 DIAGNOSIS — Z87891 Personal history of nicotine dependence: Secondary | ICD-10-CM | POA: Diagnosis not present

## 2019-02-03 DIAGNOSIS — G35 Multiple sclerosis: Secondary | ICD-10-CM | POA: Diagnosis not present

## 2019-02-03 DIAGNOSIS — R197 Diarrhea, unspecified: Secondary | ICD-10-CM | POA: Insufficient documentation

## 2019-02-03 DIAGNOSIS — Z5111 Encounter for antineoplastic chemotherapy: Secondary | ICD-10-CM | POA: Insufficient documentation

## 2019-02-03 DIAGNOSIS — F419 Anxiety disorder, unspecified: Secondary | ICD-10-CM | POA: Diagnosis not present

## 2019-02-03 DIAGNOSIS — Z803 Family history of malignant neoplasm of breast: Secondary | ICD-10-CM | POA: Diagnosis not present

## 2019-02-03 DIAGNOSIS — Z8249 Family history of ischemic heart disease and other diseases of the circulatory system: Secondary | ICD-10-CM | POA: Insufficient documentation

## 2019-02-03 DIAGNOSIS — Z833 Family history of diabetes mellitus: Secondary | ICD-10-CM | POA: Insufficient documentation

## 2019-02-03 DIAGNOSIS — K219 Gastro-esophageal reflux disease without esophagitis: Secondary | ICD-10-CM | POA: Diagnosis not present

## 2019-02-03 DIAGNOSIS — Z95828 Presence of other vascular implants and grafts: Secondary | ICD-10-CM

## 2019-02-03 DIAGNOSIS — G893 Neoplasm related pain (acute) (chronic): Secondary | ICD-10-CM | POA: Diagnosis not present

## 2019-02-03 DIAGNOSIS — Z452 Encounter for adjustment and management of vascular access device: Secondary | ICD-10-CM | POA: Diagnosis present

## 2019-02-03 LAB — COMPREHENSIVE METABOLIC PANEL
ALT: 17 U/L (ref 0–44)
AST: 19 U/L (ref 15–41)
Albumin: 3.8 g/dL (ref 3.5–5.0)
Alkaline Phosphatase: 58 U/L (ref 38–126)
Anion gap: 8 (ref 5–15)
BUN: 13 mg/dL (ref 6–20)
CO2: 23 mmol/L (ref 22–32)
Calcium: 9.1 mg/dL (ref 8.9–10.3)
Chloride: 108 mmol/L (ref 98–111)
Creatinine, Ser: 0.82 mg/dL (ref 0.44–1.00)
GFR calc Af Amer: >60 mL/min (ref >60)
GFR calc non Af Amer: >60 mL/min (ref >60)
Glucose, Bld: 104 mg/dL — ABNORMAL HIGH (ref 70–99)
Potassium: 3.7 mmol/L (ref 3.5–5.1)
Sodium: 139 mmol/L (ref 135–145)
Total Bilirubin: 0.4 mg/dL (ref 0.3–1.2)
Total Protein: 5.7 g/dL — ABNORMAL LOW (ref 6.5–8.1)

## 2019-02-03 LAB — CBC WITH DIFFERENTIAL/PLATELET
Abs Immature Granulocytes: 0.03 10*3/uL (ref 0.00–0.07)
Basophils Absolute: 0.1 10*3/uL (ref 0.0–0.1)
Basophils Relative: 1 %
Eosinophils Absolute: 0.1 10*3/uL (ref 0.0–0.5)
Eosinophils Relative: 2 %
HCT: 41.6 % (ref 36.0–46.0)
Hemoglobin: 12.6 g/dL (ref 12.0–15.0)
Immature Granulocytes: 0 %
Lymphocytes Relative: 11 %
Lymphs Abs: 1 10*3/uL (ref 0.7–4.0)
MCH: 29 pg (ref 26.0–34.0)
MCHC: 30.3 g/dL (ref 30.0–36.0)
MCV: 95.9 fL (ref 80.0–100.0)
Monocytes Absolute: 0.5 10*3/uL (ref 0.1–1.0)
Monocytes Relative: 6 %
Neutro Abs: 7.3 10*3/uL (ref 1.7–7.7)
Neutrophils Relative %: 80 %
Platelets: 282 10*3/uL (ref 150–400)
RBC: 4.34 MIL/uL (ref 3.87–5.11)
RDW: 20.6 % — ABNORMAL HIGH (ref 11.5–15.5)
WBC: 9.1 10*3/uL (ref 4.0–10.5)
nRBC: 0 % (ref 0.0–0.2)

## 2019-02-03 MED ORDER — SODIUM CHLORIDE 0.9 % IV SOLN
10.0000 mg | Freq: Once | INTRAVENOUS | Status: AC
  Administered 2019-02-03: 10 mg via INTRAVENOUS
  Filled 2019-02-03: qty 10

## 2019-02-03 MED ORDER — SODIUM CHLORIDE 0.9 % IV SOLN
Freq: Once | INTRAVENOUS | Status: AC
  Administered 2019-02-03: 09:00:00 via INTRAVENOUS

## 2019-02-03 MED ORDER — MITOMYCIN CHEMO IV INJECTION 20 MG
10.1000 mg/m2 | Freq: Once | INTRAVENOUS | Status: AC
  Administered 2019-02-03: 20 mg via INTRAVENOUS
  Filled 2019-02-03: qty 40

## 2019-02-03 MED ORDER — SODIUM CHLORIDE 0.9% FLUSH
10.0000 mL | INTRAVENOUS | Status: DC | PRN
  Administered 2019-02-03: 10 mL
  Filled 2019-02-03: qty 10

## 2019-02-03 MED ORDER — SODIUM CHLORIDE 0.9 % IV SOLN
1000.0000 mg/m2/d | INTRAVENOUS | Status: DC
  Administered 2019-02-03: 7900 mg via INTRAVENOUS
  Filled 2019-02-03: qty 100

## 2019-02-03 MED ORDER — PALONOSETRON HCL INJECTION 0.25 MG/5ML
0.2500 mg | Freq: Once | INTRAVENOUS | Status: AC
  Administered 2019-02-03: 0.25 mg via INTRAVENOUS
  Filled 2019-02-03: qty 5

## 2019-02-03 NOTE — Patient Instructions (Signed)
[facility] Discharge Instructions for Patients Receiving Chemotherapy   Beginning January 23rd 2017 lab work for the [facility] will be done in the  Main lab at [facility] on 1st floor. If you have a lab appointment with the [facility] please come in thru the  Main Entrance and check in at the main information desk   Today you received the following chemotherapy agents Mitomycin and 5FU. Follow-up as scheduled. Call clinic for any questions or concerns  To help prevent nausea and vomiting after your treatment, we encourage you to take your nausea medication   If you develop nausea and vomiting, or diarrhea that is not controlled by your medication, call the clinic.  The clinic phone number is [phone]. Office hours are Monday-Friday 8:30am-5:00pm.  BELOW ARE SYMPTOMS THAT SHOULD BE REPORTED IMMEDIATELY:  *FEVER GREATER THAN 101.0 F  *CHILLS WITH OR WITHOUT FEVER  NAUSEA AND VOMITING THAT IS NOT CONTROLLED WITH YOUR NAUSEA MEDICATION  *UNUSUAL SHORTNESS OF BREATH  *UNUSUAL BRUISING OR BLEEDING  TENDERNESS IN MOUTH AND THROAT WITH OR WITHOUT PRESENCE OF ULCERS  *URINARY PROBLEMS  *BOWEL PROBLEMS  UNUSUAL RASH Items with * indicate a potential emergency and should be followed up as soon as possible. If you have an emergency after office hours please contact your primary care physician or go to the nearest emergency department.  Please call the clinic during office hours if you have any questions or concerns.   You may also contact the Patient Navigator at [phone] should you have any questions or need assistance in obtaining follow up care.      Resources For Cancer Patients and their Caregivers ? American Cancer Society: Can assist with transportation, wigs, general needs, runs Look Good Feel Better.        (505)299-0926 ? Cancer Care: Provides financial assistance, online support groups, medication/co-pay assistance.   1-800-813-HOPE 236 427 1946) ? [place] Assists [place] cancer patients and their families through emotional , educational and financial support.  480 313 3699 ? [place] DSS Where to apply for food stamps, Medicaid and utility assistance. (410) 178-0462 ? [facility]: Transportation to medical appointments. 603 735 9255 ? Social Security Administration: May apply for disability if have a Stage IV cancer. 205 430 5752 772-039-2776 ? [employer], Disability and Transit Services: Assists with nutrition, care and transit needs. (586)105-4798

## 2019-02-03 NOTE — Assessment & Plan Note (Addendum)
1.  Anal squamous cell carcinoma, P 16+: -Presentation with worsening bleeding per rectum, felt a mass in the perianal region.  History of hemorrhoids since age 43. - Dr. 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 -Colonoscopy 5 years ago in [facility], reportedly normal. - Physical examination showed ulcerated area in the anterior anal orifice with thickening.  No inguinal adenopathy. -PET scan on 01/15/2019 showed increased radiotracer uptake localized to the soft tissues of the anus.  No findings to suggest nodal metastasis. - She is starting radiation therapy today.  We discussed the chemotherapy regimen containing 5-FU and mitomycin today.  We discussed the side effects in detail. -She will be seen back next week for follow-up and blood count check.  2.  Multiple sclerosis: -He has multiple sclerosis of several years duration, resulting in right-sided weakness, fatigue, memory issues and headaches.  She walks with help of cane and walker. - She was treated with multiple agents including Tecfidera, Betaseron, Avonex, Copaxone.  She also received mitoxantrone in the past. - She is currently on Ocrevus every 6 months.  3.  Family history: - Mother had breast cancer 3 years ago.  Sister was just diagnosed with breast cancer.

## 2019-02-03 NOTE — Progress Notes (Signed)
[address] [place], [place] [address]   CLINIC:  Medical Oncology/Hematology  PCP:  [name], MD [place] [address] [phone]   REASON FOR VISIT:  Follow-up for anal cancer.  CURRENT THERAPY: 5-FU and mitomycin.  BRIEF ONCOLOGIC HISTORY:  Oncology History  Anal cancer ([place])  01/10/2019 Initial Diagnosis   Anal cancer ([place])   01/16/2019 Cancer Staging   Staging form: Anus, AJCC 8th Edition - Clinical: Stage Unknown (cTX, cN0, cM0) - Signed by Derek Jack, MD on 01/16/2019   02/03/2019 -  Chemotherapy   The patient had palonosetron (ALOXI) injection 0.25 mg, 0.25 mg, Intravenous,  Once, 1 of 1 cycle Administration: 0.25 mg (02/03/2019) mitoMYcin (MUTAMYCIN) chemo injection 20 mg, 10.1 mg/m2 = 19.5 mg, Intravenous,  Once, 1 of 1 cycle Administration: 20 mg (02/03/2019) fluorouracil (ADRUCIL) 7,900 mg in sodium chloride 0.9 % 92 mL chemo infusion, 1,000 mg/m2/day = 7,900 mg, Intravenous, 4D (96 hours ), 1 of 1 cycle Administration: 7,900 mg (02/03/2019)  for chemotherapy treatment.       CANCER STAGING: Cancer Staging Anal cancer ([place]) Staging form: Anus, AJCC 8th Edition - Clinical: Stage Unknown (cTX, cN0, cM0) - Signed by Derek Jack, MD on 01/16/2019    INTERVAL HISTORY:  Ms. Harpster 43 y.o. female seen for follow-up of anal squamous cell carcinoma.  Reports 75% appetite and low energy levels.  Pain in the anal area reported as 2 out of 10.  Mild fatigue is also stable.  She does have some constipation.  Denies any tingling or numbness in the extremities today.  No fevers or chills reported.   REVIEW OF SYSTEMS:  Review of Systems  Constitutional: Positive for fatigue.  Gastrointestinal: Positive for constipation.  Psychiatric/Behavioral: The patient is nervous/anxious.   All other systems reviewed and are negative.    PAST MEDICAL/SURGICAL HISTORY:  Past Medical History:  Diagnosis Date   . Allergy   . Anal cancer ([place]) 01/10/2019  . Arthritis   . Blood transfusion without reported diagnosis    2013  . Depression   . DVT (deep venous thrombosis) ([place]) 2013  . Gait abnormality 08/03/2016  . Hyperlipidemia   . Hypertension   . Hypothyroidism   . Multiple sclerosis ([place])   . Neurogenic bladder 08/03/2016  . Neuromuscular disorder ([place])    dx'd with MS at age of 48  . Port-A-Cath in place 01/23/2019  . PTSD (post-traumatic stress disorder)   . Right foot drop 08/03/2016  . Thyroid disease   . Ulcer    Past Surgical History:  Procedure Laterality Date  . COLON SURGERY     Colonoscopy  . GASTRIC BYPASS  2010  . HEMORRHOID SURGERY N/A 12/25/2018   Procedure: EXTENSIVE HEMORRHOIDECTOMY;  Surgeon: Aviva Signs, MD;  Location: [facility];  Service: General;  Laterality: N/A;  . INTERSTIM IMPLANT PLACEMENT  2014  . PORTACATH PLACEMENT       SOCIAL HISTORY:  Social History   Socioeconomic History  . Marital status: Single    Spouse name: Ricky  . Number of children: 0  . Years of education: 73  . Highest education level: Associate degree: occupational, Hotel manager, or vocational program  Occupational History  . Occupation: Disability  Social Needs  . Financial resource strain: Not hard at all  . Food insecurity    Worry: Never true    Inability: Never true  . Transportation needs    Medical: No    Non-medical: No  Tobacco Use  . Smoking status: Former Smoker    Packs/day: 1.50    Years: 15.00    Pack years: 22.50    Types: Cigarettes    Start date: 1993    Quit date: 01/30/2009    Years since quitting: 10.0  . Smokeless tobacco: Never Used  Substance and Sexual Activity  . 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 Drug use: No  . Sexual activity: Yes  Lifestyle  . Physical activity    Days per week: 2 days    Minutes per session: 30 min  . Stress: Not at all  Relationships  . Social connections    Talks on phone: More than three times a  week    Gets together: More than three times a week    Attends religious service: Never    Active member of club or organization: No    Attends meetings of clubs or organizations: Never    Relationship status: Living with partner  . Intimate partner violence    Fear of current or ex partner: No    Emotionally abused: No    Physically abused: No    Forced sexual activity: No  Other Topics Concern  . Not on file  Social History Narrative   Lives at home with her boyfriend   Right-handed   Caffeine: 2 glasses per day    FAMILY HISTORY:  Family History  Problem Relation Age of Onset  . Cancer Mother        Dx'd at age 22  . Breast cancer Mother   . Hypertension Father   . Diabetes Father   . Hypertension Sister   . Miscarriages / Stillbirths Sister   . Heart disease Maternal Grandmother   . Diabetes Maternal Grandmother   . Heart disease Maternal Grandfather   . Diabetes Paternal Grandmother   . Heart disease Paternal Grandfather   . Stroke Paternal Grandfather     CURRENT MEDICATIONS:  Outpatient Encounter Medications as of 02/03/2019  Medication Sig  . ALLERGY RELIEF 180 MG tablet Take 180 mg by mouth daily as needed for allergies.   [name] amitriptyline (ELAVIL) 50 MG tablet Take 1 tablet (50 mg total) by mouth at bedtime.  . Cholecalciferol (VITAMIN D3) 5000 units CAPS Take 5,000 Units by mouth every morning.  . docusate sodium (STOOL SOFTENER) 100 MG capsule Take 100 mg by mouth 2 (two) times daily.   [name] escitalopram (LEXAPRO) 10 MG tablet Take 1 tablet (10 mg total) by mouth daily.  . ferrous fumarate (HEMOCYTE - 106 MG FE) 325 (106 Fe) MG TABS tablet Take 325 mg by mouth 2 (two) times daily.   . fluorouracil CALGB 16109 in sodium chloride 0.9 % 150 mL Inject into the vein over 96 hr. Days 1-4 and 28-31  . flurazepam (DALMANE) 30 MG capsule Take 1 capsule (30 mg total) by mouth at bedtime.  [name] HYDROcodone-acetaminophen (NORCO) 10-325 MG tablet Take 1 tablet by mouth at  bedtime as needed.  . hydrocortisone (ANUSOL-HC) 2.5 % rectal cream Place 1 application rectally 2 (two) times daily. (Patient not taking: Reported on 01/10/2019)  . levonorgestrel-ethinyl estradiol (NORDETTE) 0.15-30 MG-MCG tablet Take 1 tablet by mouth daily.  [name] levothyroxine (SYNTHROID) 100 MCG tablet Take 1 tablet (100 mcg total) by mouth daily.  [name] lidocaine-prilocaine (EMLA) cream Apply a small amount to port a cath site and cover with plastic wrap one hour prior to chemotherapy appointments  . loperamide (IMODIUM) 1  MG/5ML solution Take 6 mg by mouth as needed for diarrhea or loose stools.   [name] MITOMYCIN IV Inject into the vein. Days 1, 28  . mometasone (NASONEX) 50 MCG/ACT nasal spray Place 2 sprays into the nose 2 (two) times daily as needed (allergies).   Wess Botts (OCREVUS IV) Inject into the vein every 6 (six) months.   [name] omeprazole (PRILOSEC) 20 MG capsule Take 1 capsule (20 mg total) by mouth daily.  . predniSONE (DELTASONE) 5 MG tablet Begin taking 6 tablets daily, taper by one tablet every other day until off the medication.  . prochlorperazine (COMPAZINE) 10 MG tablet Take 1 tablet (10 mg total) by mouth every 6 (six) hours as needed (Nausea or vomiting).  . vitamin B-12 (CYANOCOBALAMIN) 500 MCG tablet Take 500 mcg by mouth daily.  . [DISCONTINUED] fluticasone (FLONASE) 50 MCG/ACT nasal spray USE 2 SPRAY(S) IN EACH NOSTRIL ONCE DAILY   No facility-administered encounter medications on file as of 02/03/2019.     ALLERGIES:  Allergies  Allergen Reactions  . Other Hives    Plastic Tape  . Tecfidera [Dimethyl Fumarate] Itching     PHYSICAL EXAM:  ECOG Performance status: 1  Vitals:   02/03/19 0810  BP: (!) 101/50  Pulse: 96  Resp: 16  Temp: 97.7 F (36.5 C)  SpO2: 100%   Filed Weights   02/03/19 0810  Weight: 186 lb 8 oz (84.6 kg)    Physical Exam Vitals signs reviewed.  Constitutional:      Appearance: Normal appearance.  Cardiovascular:     Rate and  Rhythm: Normal rate and regular rhythm.     Heart sounds: Normal heart sounds.  Pulmonary:     Breath sounds: Normal breath sounds.  Abdominal:     General: There is no distension.     Palpations: Abdomen is soft. There is no mass.  Musculoskeletal:        General: No swelling.  Skin:    General: Skin is warm.  Neurological:     General: No focal deficit present.     Mental Status: She is alert and oriented to person, place, and time.  Psychiatric:        Mood and Affect: Mood normal.        Behavior: Behavior normal.      LABORATORY DATA:  I have reviewed the labs as listed.  CBC    Component Value Date/Time   WBC 9.1 02/03/2019 0813   RBC 4.34 02/03/2019 0813   HGB 12.6 02/03/2019 0813   HGB 10.2 (L) 12/04/2018 1427   HCT 41.6 02/03/2019 0813   HCT 34.4 12/04/2018 1427   PLT 282 02/03/2019 0813   PLT 453 (H) 12/04/2018 1427   MCV 95.9 02/03/2019 0813   MCV 86 12/04/2018 1427   MCH 29.0 02/03/2019 0813   MCHC 30.3 02/03/2019 0813   RDW 20.6 (H) 02/03/2019 0813   RDW 14.3 12/04/2018 1427   LYMPHSABS 1.0 02/03/2019 0813   LYMPHSABS 1.4 12/04/2018 1427   MONOABS 0.5 02/03/2019 0813   EOSABS 0.1 02/03/2019 0813   EOSABS 0.1 12/04/2018 1427   BASOSABS 0.1 02/03/2019 0813   BASOSABS 0.1 12/04/2018 1427   CMP Latest Ref Rng & Units 02/03/2019 12/04/2018 11/13/2018  Glucose 70 - 99 mg/dL 104(H) 66 70  BUN 6 - 20 mg/dL 13 8 9   Creatinine 0.44 - 1.00 mg/dL 0.82 0.76 0.87  Sodium 135 - 145 mmol/L 139 140 139  Potassium 3.5 - 5.1 mmol/L 3.7 4.2 4.2  Chloride 98 - 111 mmol/L 108 105 104  CO2 22 - 32 mmol/L 23 23 21   Calcium 8.9 - 10.3 mg/dL 9.1 9.3 10.0  Total Protein 6.5 - 8.1 g/dL 5.7(L) 5.1(L) 5.6(L)  Total Bilirubin 0.3 - 1.2 mg/dL 0.4 <0.2 <0.2  Alkaline Phos 38 - 126 U/L 58 94 103  AST 15 - 41 U/L 19 14 15   ALT 0 - 44 U/L 17 11 13        DIAGNOSTIC IMAGING:  I have independently reviewed the scans and discussed with the patient.     ASSESSMENT & PLAN:    Anal cancer ([place]) 1.  Anal squamous cell carcinoma, P 16+: -Presentation with worsening bleeding per rectum, felt a mass in the perianal region.  History of hemorrhoids since age 50. - Dr. 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 -Colonoscopy 5 years ago in [facility], reportedly normal. - Physical examination showed ulcerated area in the anterior anal orifice with thickening.  No inguinal adenopathy. -PET scan on 01/15/2019 showed increased radiotracer uptake localized to the soft tissues of the anus.  No findings to suggest nodal metastasis. - She is starting radiation therapy today.  We discussed the chemotherapy regimen containing 5-FU and mitomycin today.  We discussed the side effects in detail. -She will be seen back next week for follow-up and blood count check.  2.  Multiple sclerosis: -He has multiple sclerosis of several years duration, resulting in right-sided weakness, fatigue, memory issues and headaches.  She walks with help of cane and walker. - She was treated with multiple agents including Tecfidera, Betaseron, Avonex, Copaxone.  She also received mitoxantrone in the past. - She is currently on Ocrevus every 6 months.  3.  Family history: - Mother had breast cancer 3 years ago.  Sister was just diagnosed with breast cancer.   Total time spent is 40 minutes with more than 50% of the time spent face-to-face discussing treatment plan, side effects, counseling and coordination of care.  Orders placed this encounter:  Orders Placed This Encounter  Procedures  . CBC with Differential/Platelet  . Comprehensive metabolic panel  . Magnesium      Derek Jack, MD [place] [phone]

## 2019-02-03 NOTE — Patient Instructions (Addendum)
[facility] at [facility] Discharge Instructions  You were seen today by Dr. Katragadda. He went over your recent lab results. He will see you back in 1 week for labs and follow up.   Thank you for choosing [facility] at [facility] to provide your oncology and hematology care.  To afford each patient quality time with our provider, please arrive at least 15 minutes before your scheduled appointment time.   If you have a lab appointment with the Cancer Center please come in thru the  Main Entrance and check in at the main information desk  You need to re-schedule your appointment should you arrive 10 or more minutes late.  We strive to give you quality time with our providers, and arriving late affects you and other patients whose appointments are after yours.  Also, if you no show three or more times for appointments you may be dismissed from the clinic at the providers discretion.     Again, thank you for choosing [facility].  Our hope is that these requests will decrease the amount of time that you wait before being seen by our physicians.       _____________________________________________________________  Should you have questions after your visit to [facility], please contact our office at [phone] between the hours of 8:00 a.m. and 4:30 p.m.  Voicemails left after 4:00 p.m. will not be returned until the following business day.  For prescription refill requests, have your pharmacy contact our office and allow 72 hours.    Cancer Center Support Programs:   > Cancer Support Group  2nd Tuesday of the month 1pm-2pm, Journey Room    

## 2019-02-03 NOTE — Progress Notes (Signed)
0850 Labs reviewed with and pt seen by Dr. Delton Coombes and pt approved for chemo tx today per MD                           [name] tolerated chemo tx well without complaints or incident. Pt discharged with 5FU pump infusing without issues. Pt viewed the infusion pump video with instructions,spill kit and packet reviewed and given to pt. Pt verbalized understanding of all information given. VSS upon discharge. Pt discharged via wheelchair in satisfactory condition

## 2019-02-03 NOTE — Patient Instructions (Signed)
[facility] at [facility] Discharge Instructions  Labs drawn from portacath today   Thank you for choosing [facility] at [facility] to provide your oncology and hematology care.  To afford each patient quality time with our provider, please arrive at least 15 minutes before your scheduled appointment time.   If you have a lab appointment with the Cancer Center please come in thru the Main Entrance and check in at the main information desk.  You need to re-schedule your appointment should you arrive 10 or more minutes late.  We strive to give you quality time with our providers, and arriving late affects you and other patients whose appointments are after yours.  Also, if you no show three or more times for appointments you may be dismissed from the clinic at the providers discretion.     Again, thank you for choosing [facility].  Our hope is that these requests will decrease the amount of time that you wait before being seen by our physicians.       _____________________________________________________________  Should you have questions after your visit to [facility], please contact our office at [phone] between the hours of 8:00 a.m. and 4:30 p.m.  Voicemails left after 4:00 p.m. will not be returned until the following business day.  For prescription refill requests, have your pharmacy contact our office and allow 72 hours.    Due to Covid, you will need to wear a mask upon entering the hospital. If you do not have a mask, a mask will be given to you at the Main Entrance upon arrival. For doctor visits, patients may have 1 support person with them. For treatment visits, patients can not have anyone with them due to social distancing guidelines and our immunocompromised population.     

## 2019-02-04 ENCOUNTER — Telehealth (HOSPITAL_COMMUNITY): Payer: Self-pay

## 2019-02-04 NOTE — Telephone Encounter (Signed)
24 hour follow up-left message for patient to call us if she has any concerns or questions. 

## 2019-02-07 ENCOUNTER — Inpatient Hospital Stay (HOSPITAL_COMMUNITY): Payer: Medicare HMO

## 2019-02-07 ENCOUNTER — Encounter (HOSPITAL_COMMUNITY): Payer: Self-pay

## 2019-02-07 ENCOUNTER — Other Ambulatory Visit: Payer: Self-pay

## 2019-02-07 VITALS — BP 113/65 | HR 83 | Temp 98.0°F | Resp 18

## 2019-02-07 DIAGNOSIS — C210 Malignant neoplasm of anus, unspecified: Secondary | ICD-10-CM

## 2019-02-07 DIAGNOSIS — Z95828 Presence of other vascular implants and grafts: Secondary | ICD-10-CM

## 2019-02-07 MED ORDER — SODIUM CHLORIDE 0.9% FLUSH
10.0000 mL | INTRAVENOUS | Status: DC | PRN
  Administered 2019-02-07: 12:00:00 10 mL
  Filled 2019-02-07: qty 10

## 2019-02-07 MED ORDER — HEPARIN SOD (PORK) LOCK FLUSH 100 UNIT/ML IV SOLN
500.0000 [IU] | Freq: Once | INTRAVENOUS | Status: AC | PRN
  Administered 2019-02-07: 500 [IU]

## 2019-02-07 NOTE — Patient Instructions (Signed)
[facility] at [facility] Discharge Instructions  5FU pump discontinued today with portacath flushed per protocol. Follow-up as scheduled. Call clinic for any questions or concerns   Thank you for choosing [facility] at [facility] to provide your oncology and hematology care.  To afford each patient quality time with our provider, please arrive at least 15 minutes before your scheduled appointment time.   If you have a lab appointment with the Cancer Center please come in thru the Main Entrance and check in at the main information desk.  You need to re-schedule your appointment should you arrive 10 or more minutes late.  We strive to give you quality time with our providers, and arriving late affects you and other patients whose appointments are after yours.  Also, if you no show three or more times for appointments you may be dismissed from the clinic at the providers discretion.     Again, thank you for choosing [facility].  Our hope is that these requests will decrease the amount of time that you wait before being seen by our physicians.       _____________________________________________________________  Should you have questions after your visit to  Cancer Center, please contact our office at [phone] between the hours of 8:00 a.m. and 4:30 p.m.  Voicemails left after 4:00 p.m. will not be returned until the following business day.  For prescription refill requests, have your pharmacy contact our office and allow 72 hours.    Due to Covid, you will need to wear a mask upon entering the hospital. If you do not have a mask, a mask will be given to you at the Main Entrance upon arrival. For doctor visits, patients may have 1 support person with them. For treatment visits, patients can not have anyone with them due to social distancing guidelines and our immunocompromised population.     

## 2019-02-07 NOTE — Progress Notes (Signed)
Dare D Gahm tolerated 5FU pump well without complaints or incident. 5FU pump discontinued with portacath flushed per protocol and de-accessed. VSS Pt discharged via wheelchair in satisfactory condition

## 2019-02-11 ENCOUNTER — Inpatient Hospital Stay (HOSPITAL_COMMUNITY): Payer: Medicare HMO | Admitting: Hematology

## 2019-02-11 ENCOUNTER — Other Ambulatory Visit: Payer: Self-pay

## 2019-02-11 ENCOUNTER — Inpatient Hospital Stay (HOSPITAL_COMMUNITY): Payer: Medicare HMO

## 2019-02-11 ENCOUNTER — Encounter (HOSPITAL_COMMUNITY): Payer: Self-pay | Admitting: Hematology

## 2019-02-11 DIAGNOSIS — C210 Malignant neoplasm of anus, unspecified: Secondary | ICD-10-CM | POA: Diagnosis not present

## 2019-02-11 LAB — CBC WITH DIFFERENTIAL/PLATELET
Abs Immature Granulocytes: 0.04 10*3/uL (ref 0.00–0.07)
Basophils Absolute: 0 10*3/uL (ref 0.0–0.1)
Basophils Relative: 1 %
Eosinophils Absolute: 0.2 10*3/uL (ref 0.0–0.5)
Eosinophils Relative: 4 %
HCT: 39.2 % (ref 36.0–46.0)
Hemoglobin: 12.2 g/dL (ref 12.0–15.0)
Immature Granulocytes: 1 %
Lymphocytes Relative: 10 %
Lymphs Abs: 0.4 10*3/uL — ABNORMAL LOW (ref 0.7–4.0)
MCH: 29.9 pg (ref 26.0–34.0)
MCHC: 31.1 g/dL (ref 30.0–36.0)
MCV: 96.1 fL (ref 80.0–100.0)
Monocytes Absolute: 0.1 10*3/uL (ref 0.1–1.0)
Monocytes Relative: 3 %
Neutro Abs: 3.3 10*3/uL (ref 1.7–7.7)
Neutrophils Relative %: 81 %
Platelets: 211 10*3/uL (ref 150–400)
RBC: 4.08 MIL/uL (ref 3.87–5.11)
RDW: 18.9 % — ABNORMAL HIGH (ref 11.5–15.5)
WBC: 4.1 10*3/uL (ref 4.0–10.5)
nRBC: 0 % (ref 0.0–0.2)

## 2019-02-11 LAB — COMPREHENSIVE METABOLIC PANEL
ALT: 15 U/L (ref 0–44)
AST: 18 U/L (ref 15–41)
Albumin: 3.6 g/dL (ref 3.5–5.0)
Alkaline Phosphatase: 49 U/L (ref 38–126)
Anion gap: 8 (ref 5–15)
BUN: 12 mg/dL (ref 6–20)
CO2: 24 mmol/L (ref 22–32)
Calcium: 9.3 mg/dL (ref 8.9–10.3)
Chloride: 107 mmol/L (ref 98–111)
Creatinine, Ser: 0.82 mg/dL (ref 0.44–1.00)
GFR calc Af Amer: >60 mL/min (ref >60)
GFR calc non Af Amer: >60 mL/min (ref >60)
Glucose, Bld: 95 mg/dL (ref 70–99)
Potassium: 3.5 mmol/L (ref 3.5–5.1)
Sodium: 139 mmol/L (ref 135–145)
Total Bilirubin: 0.3 mg/dL (ref 0.3–1.2)
Total Protein: 5.5 g/dL — ABNORMAL LOW (ref 6.5–8.1)

## 2019-02-11 LAB — MAGNESIUM: Magnesium: 2 mg/dL (ref 1.7–2.4)

## 2019-02-11 NOTE — Progress Notes (Signed)
[name], [place] [address]   CLINIC:  Medical Oncology/Hematology  PCP:  [name], MD [place] [address] [phone]   REASON FOR VISIT:  Follow-up for anal cancer.  CURRENT THERAPY: 5-FU and mitomycin.  BRIEF ONCOLOGIC HISTORY:  Oncology History  Anal cancer ([place])  01/10/2019 Initial Diagnosis   Anal cancer ([place])   01/16/2019 Cancer Staging   Staging form: Anus, AJCC 8th Edition - Clinical: Stage Unknown (cTX, cN0, cM0) - Signed by Derek Jack, MD on 01/16/2019   02/03/2019 -  Chemotherapy   The patient had palonosetron (ALOXI) injection 0.25 mg, 0.25 mg, Intravenous,  Once, 1 of 1 cycle Administration: 0.25 mg (02/03/2019) mitoMYcin (MUTAMYCIN) chemo injection 20 mg, 10.1 mg/m2 = 19.5 mg, Intravenous,  Once, 1 of 1 cycle Administration: 20 mg (02/03/2019) fluorouracil (ADRUCIL) 7,900 mg in sodium chloride 0.9 % 92 mL chemo infusion, 1,000 mg/m2/day = 7,900 mg, Intravenous, 4D (96 hours ), 1 of 1 cycle Administration: 7,900 mg (02/03/2019)  for chemotherapy treatment.       CANCER STAGING: Cancer Staging Anal cancer ([place]) Staging form: Anus, AJCC 8th Edition - Clinical: Stage Unknown (cTX, cN0, cM0) - Signed by Derek Jack, MD on 01/16/2019    INTERVAL HISTORY:  Amber Williams 43 y.o. female seen for follow-up of anal squamous cell carcinoma.  She did receive chemotherapy with 5-FU and mitomycin on 02/03/2019.  She also started radiation on the same day.  Had 2 episodes of diarrhea which were controlled with Imodium.  Also had a couple of episodes of nausea which was controlled with Compazine.  Appetite is 75%.  Energy levels are low.  Pain in the perianal region reported as 2 out of 10.  Does not report any pain or skin peeling in the palms and soles.   REVIEW OF SYSTEMS:  Review of Systems  Gastrointestinal: Positive for diarrhea.  All other systems reviewed and are negative.    PAST  MEDICAL/SURGICAL HISTORY:  Past Medical History:  Diagnosis Date  . Allergy   . Anal cancer ([facility]) 01/10/2019  . Arthritis   . Blood transfusion without reported diagnosis    2013  . Depression   . DVT (deep venous thrombosis) ([facility]) 2013  . Gait abnormality 08/03/2016  . Hyperlipidemia   . Hypertension   . Hypothyroidism   . Multiple sclerosis ([facility])   . Neurogenic bladder 08/03/2016  . Neuromuscular disorder ([employer])    dx'd with MS at age of 62  . Port-A-Cath in place 01/23/2019  . PTSD (post-traumatic stress disorder)   . Right foot drop 08/03/2016  . Thyroid disease   . Ulcer    Past Surgical History:  Procedure Laterality Date  . COLON SURGERY     Colonoscopy  . GASTRIC BYPASS  2010  . HEMORRHOID SURGERY N/A 12/25/2018   Procedure: EXTENSIVE HEMORRHOIDECTOMY;  Surgeon: Aviva Signs, MD;  Location: [facility];  Service: General;  Laterality: N/A;  . INTERSTIM IMPLANT PLACEMENT  2014  . PORTACATH PLACEMENT       SOCIAL HISTORY:  Social History   Socioeconomic History  . Marital status: Single    Spouse name: Ricky  . Number of children: 0  . Years of education: 82  . Highest education level: Associate degree: occupational, Hotel manager, or vocational program  Occupational History  . Occupation: Disability  Social Needs  . Financial resource strain: Not hard at all  . Food insecurity    Worry: Never true  Inability: Never true  . Transportation needs    Medical: No    Non-medical: No  Tobacco Use  . Smoking status: Former Smoker    Packs/day: 1.50    Years: 15.00    Pack years: 22.50    Types: Cigarettes    Start date: 1993    Quit date: 01/30/2009    Years since quitting: 10.0  . Smokeless tobacco: Never Used  Substance and Sexual Activity  . 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 Drug use: No  . Sexual activity: Yes  Lifestyle  . Physical activity    Days per week: 2 days    Minutes per session: 30 min  . Stress: Not at all  Relationships   . Social connections    Talks on phone: More than three times a week    Gets together: More than three times a week    Attends religious service: Never    Active member of club or organization: No    Attends meetings of clubs or organizations: Never    Relationship status: Living with partner  . Intimate partner violence    Fear of current or ex partner: No    Emotionally abused: No    Physically abused: No    Forced sexual activity: No  Other Topics Concern  . Not on file  Social History Narrative   Lives at home with her boyfriend   Right-handed   Caffeine: 2 glasses per day    FAMILY HISTORY:  Family History  Problem Relation Age of Onset  . Cancer Mother        Dx'd at age 58  . Breast cancer Mother   . Hypertension Father   . Diabetes Father   . Hypertension Sister   . Miscarriages / Stillbirths Sister   . Heart disease Maternal Grandmother   . Diabetes Maternal Grandmother   . Heart disease Maternal Grandfather   . Diabetes Paternal Grandmother   . Heart disease Paternal Grandfather   . Stroke Paternal Grandfather     CURRENT MEDICATIONS:  Outpatient Encounter Medications as of 02/11/2019  Medication Sig  . ALLERGY RELIEF 180 MG tablet Take 180 mg by mouth daily as needed for allergies.   [name] amitriptyline (ELAVIL) 50 MG tablet Take 1 tablet (50 mg total) by mouth at bedtime.  . Cholecalciferol (VITAMIN D3) 5000 units CAPS Take 5,000 Units by mouth every morning.  . docusate sodium (STOOL SOFTENER) 100 MG capsule Take 100 mg by mouth 2 (two) times daily.   [name] escitalopram (LEXAPRO) 10 MG tablet Take 1 tablet (10 mg total) by mouth daily.  . ferrous fumarate (HEMOCYTE - 106 MG FE) 325 (106 Fe) MG TABS tablet Take 325 mg by mouth 2 (two) times daily.   . fluorouracil CALGB 16109 in sodium chloride 0.9 % 150 mL Inject into the vein over 96 hr. Days 1-4 and 28-31  . flurazepam (DALMANE) 30 MG capsule Take 1 capsule (30 mg total) by mouth at bedtime.  [name]  HYDROcodone-acetaminophen (NORCO) 10-325 MG tablet Take 1 tablet by mouth at bedtime as needed.  . hydrocortisone (ANUSOL-HC) 2.5 % rectal cream Place 1 application rectally 2 (two) times daily.  [name] levonorgestrel-ethinyl estradiol (NORDETTE) 0.15-30 MG-MCG tablet Take 1 tablet by mouth daily.  [name] levothyroxine (SYNTHROID) 100 MCG tablet Take 1 tablet (100 mcg total) by mouth daily.  [name] lidocaine-prilocaine (EMLA) cream Apply a small amount to port a cath site and cover with plastic  wrap one hour prior to chemotherapy appointments  . MITOMYCIN IV Inject into the vein. Days 1, 28  . mometasone (NASONEX) 50 MCG/ACT nasal spray Place 2 sprays into the nose 2 (two) times daily as needed (allergies).   Wess Botts (OCREVUS IV) Inject into the vein every 6 (six) months.   [name] omeprazole (PRILOSEC) 20 MG capsule Take 1 capsule (20 mg total) by mouth daily.  . predniSONE (DELTASONE) 5 MG tablet Begin taking 6 tablets daily, taper by one tablet every other day until off the medication.  . prochlorperazine (COMPAZINE) 10 MG tablet Take 1 tablet (10 mg total) by mouth every 6 (six) hours as needed (Nausea or vomiting).  . vitamin B-12 (CYANOCOBALAMIN) 500 MCG tablet Take 500 mcg by mouth daily.  [name] loperamide (IMODIUM) 1 MG/5ML solution Take 6 mg by mouth as needed for diarrhea or loose stools.    No facility-administered encounter medications on file as of 02/11/2019.     ALLERGIES:  Allergies  Allergen Reactions  . Other Hives    Plastic Tape  . Tecfidera [Dimethyl Fumarate] Itching     PHYSICAL EXAM:  ECOG Performance status: 1  Vitals:   02/11/19 0900  BP: 128/64  Pulse: 86  Resp: 16  Temp: (!) 97.5 F (36.4 C)  SpO2: 100%   Filed Weights   02/11/19 0900  Weight: 186 lb 5 oz (84.5 kg)    Physical Exam Vitals signs reviewed.  Constitutional:      Appearance: Normal appearance.  Cardiovascular:     Rate and Rhythm: Normal rate and regular rhythm.     Heart sounds: Normal heart  sounds.  Pulmonary:     Breath sounds: Normal breath sounds.  Abdominal:     General: There is no distension.     Palpations: Abdomen is soft. There is no mass.  Musculoskeletal:        General: No swelling.  Skin:    General: Skin is warm.  Neurological:     General: No focal deficit present.     Mental Status: She is alert and oriented to person, place, and time.  Psychiatric:        Mood and Affect: Mood normal.        Behavior: Behavior normal.      LABORATORY DATA:  I have reviewed the labs as listed.  CBC    Component Value Date/Time   WBC 4.1 02/11/2019 0913   RBC 4.08 02/11/2019 0913   HGB 12.2 02/11/2019 0913   HGB 10.2 (L) 12/04/2018 1427   HCT 39.2 02/11/2019 0913   HCT 34.4 12/04/2018 1427   PLT 211 02/11/2019 0913   PLT 453 (H) 12/04/2018 1427   MCV 96.1 02/11/2019 0913   MCV 86 12/04/2018 1427   MCH 29.9 02/11/2019 0913   MCHC 31.1 02/11/2019 0913   RDW 18.9 (H) 02/11/2019 0913   RDW 14.3 12/04/2018 1427   LYMPHSABS 0.4 (L) 02/11/2019 0913   LYMPHSABS 1.4 12/04/2018 1427   MONOABS 0.1 02/11/2019 0913   EOSABS 0.2 02/11/2019 0913   EOSABS 0.1 12/04/2018 1427   BASOSABS 0.0 02/11/2019 0913   BASOSABS 0.1 12/04/2018 1427   CMP Latest Ref Rng & Units 02/11/2019 02/03/2019 12/04/2018  Glucose 70 - 99 mg/dL 95 104(H) 66  BUN 6 - 20 mg/dL 12 13 8   Creatinine 0.44 - 1.00 mg/dL 0.82 0.82 0.76  Sodium 135 - 145 mmol/L 139 139 140  Potassium 3.5 - 5.1 mmol/L 3.5 3.7 4.2  Chloride 98 -  111 mmol/L 107 108 105  CO2 22 - 32 mmol/L 24 23 23   Calcium 8.9 - 10.3 mg/dL 9.3 9.1 9.3  Total Protein 6.5 - 8.1 g/dL 5.5(L) 5.7(L) 5.1(L)  Total Bilirubin 0.3 - 1.2 mg/dL 0.3 0.4 <0.2  Alkaline Phos 38 - 126 U/L 49 58 94  AST 15 - 41 U/L 18 19 14   ALT 0 - 44 U/L 15 17 11        DIAGNOSTIC IMAGING:  I have independently reviewed the scans and discussed with the patient.     ASSESSMENT & PLAN:   Anal cancer ([place]) 1.  Anal squamous cell carcinoma, P 16+:  -Presentation with worsening bleeding per rectum, felt a mass in the perianal region.  History of hemorrhoids since age 7. - Dr. 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 -Colonoscopy 5 years ago in [facility], reportedly normal. - Physical examination showed ulcerated area in the anterior anal orifice with thickening.  No inguinal adenopathy. -PET scan on 01/15/2019 showed increased radiotracer uptake localized to the soft tissues of the anus.  No findings to suggest nodal metastasis. - XRT with 5-FU and mitomycin started on 02/03/2019. - She did experience some diarrhea, controlled with Imodium.  Denied any nausea or vomiting. - I reviewed her blood counts which are within normal limits.  No hand-foot skin reaction noted. - We will see her back in 2 weeks to repeat her blood counts and toxicity assessment.  2.  Multiple sclerosis: -He has multiple sclerosis of several years duration, resulting in right-sided weakness, fatigue, memory issues and headaches.  She walks with help of cane and walker. - She was treated with multiple agents including Tecfidera, Betaseron, Avonex, Copaxone.  She also received mitoxantrone in the past. - She is currently on Ocrevus every 6 months.  3.  Family history: - Mother had breast cancer 3 years ago.  Sister was just diagnosed with breast cancer.   Total time spent is 25 minutes with more than 50% of the time spent face-to-face discussing treatment plan, counseling and coordination of care.  Orders placed this encounter:  No orders of the defined types were placed in this encounter.     Derek Jack, MD [place] [phone]

## 2019-02-11 NOTE — Patient Instructions (Signed)
[facility] at [facility] Discharge Instructions  You were seen today by Dr. Katragadda. He went over your recent lab results. He will see you back in 2 weeks for labs and follow up.   Thank you for choosing  Can[facility] Center at [facility] to provide your oncology and hematology care.  To afford each patient quality time with our provider, please arrive at least 15 minutes before your scheduled appointment time.   If you have a lab appointment with the Cancer Center please come in thru the  Main Entrance and check in at the main information desk  You need to re-schedule your appointment should you arrive 10 or more minutes late.  We strive to give you quality time with our providers, and arriving late affects you and other patients whose appointments are after yours.  Also, if you no show three or more times for appointments you may be dismissed from the clinic at the providers discretion.     Again, thank you for choosing [facility].  Our hope is that these requests will decrease the amount of time that you wait before being seen by our physicians.       _____________________________________________________________  Should you have questions after your visit to [facility], please contact our office at [phone] between the hours of 8:00 a.m. and 4:30 p.m.  Voicemails left after 4:00 p.m. will not be returned until the following business day.  For prescription refill requests, have your pharmacy contact our office and allow 72 hours.    Cancer Center Support Programs:   > Cancer Support Group  2nd Tuesday of the month 1pm-2pm, Journey Room    

## 2019-02-12 ENCOUNTER — Encounter (HOSPITAL_COMMUNITY): Payer: Self-pay | Admitting: Hematology

## 2019-02-12 NOTE — Assessment & Plan Note (Signed)
1.  Anal squamous cell carcinoma, P 16+: -Presentation with worsening bleeding per rectum, felt a mass in the perianal region.  History of hemorrhoids since age 43. - Dr. 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 -Colonoscopy 5 years ago in [facility], reportedly normal. - Physical examination showed ulcerated area in the anterior anal orifice with thickening.  No inguinal adenopathy. -PET scan on 01/15/2019 showed increased radiotracer uptake localized to the soft tissues of the anus.  No findings to suggest nodal metastasis. - XRT with 5-FU and mitomycin started on 02/03/2019. - She did experience some diarrhea, controlled with Imodium.  Denied any nausea or vomiting. - I reviewed her blood counts which are within normal limits.  No hand-foot skin reaction noted. - We will see her back in 2 weeks to repeat her blood counts and toxicity assessment.  2.  Multiple sclerosis: -He has multiple sclerosis of several years duration, resulting in right-sided weakness, fatigue, memory issues and headaches.  She walks with help of cane and walker. - She was treated with multiple agents including Tecfidera, Betaseron, Avonex, Copaxone.  She also received mitoxantrone in the past. - She is currently on Ocrevus every 6 months.  3.  Family history: - Mother had breast cancer 3 years ago.  Sister was just diagnosed with breast cancer.

## 2019-02-25 ENCOUNTER — Telehealth: Payer: Self-pay

## 2019-02-25 NOTE — Telephone Encounter (Signed)
Pt may qualify for drug assistance for ocrevus. Dr. Jannifer Franklin completed the [employer] form and I have faxed back t [employer] ( f # [phone]), confirmation received.

## 2019-02-26 ENCOUNTER — Other Ambulatory Visit: Payer: Self-pay

## 2019-02-27 ENCOUNTER — Inpatient Hospital Stay (HOSPITAL_COMMUNITY): Payer: Medicare HMO

## 2019-02-27 ENCOUNTER — Inpatient Hospital Stay (HOSPITAL_COMMUNITY): Payer: Medicare HMO | Admitting: Hematology

## 2019-02-27 ENCOUNTER — Encounter (HOSPITAL_COMMUNITY): Payer: Self-pay | Admitting: Hematology

## 2019-02-27 DIAGNOSIS — C210 Malignant neoplasm of anus, unspecified: Secondary | ICD-10-CM

## 2019-02-27 LAB — COMPREHENSIVE METABOLIC PANEL
ALT: 14 U/L (ref 0–44)
AST: 15 U/L (ref 15–41)
Albumin: 3.4 g/dL — ABNORMAL LOW (ref 3.5–5.0)
Alkaline Phosphatase: 57 U/L (ref 38–126)
Anion gap: 7 (ref 5–15)
BUN: 6 mg/dL (ref 6–20)
CO2: 27 mmol/L (ref 22–32)
Calcium: 9.3 mg/dL (ref 8.9–10.3)
Chloride: 108 mmol/L (ref 98–111)
Creatinine, Ser: 0.75 mg/dL (ref 0.44–1.00)
GFR calc Af Amer: >60 mL/min (ref >60)
GFR calc non Af Amer: >60 mL/min (ref >60)
Glucose, Bld: 58 mg/dL — ABNORMAL LOW (ref 70–99)
Potassium: 3.5 mmol/L (ref 3.5–5.1)
Sodium: 142 mmol/L (ref 135–145)
Total Bilirubin: 0.3 mg/dL (ref 0.3–1.2)
Total Protein: 5.5 g/dL — ABNORMAL LOW (ref 6.5–8.1)

## 2019-02-27 LAB — CBC WITH DIFFERENTIAL/PLATELET
Abs Immature Granulocytes: 0.02 10*3/uL (ref 0.00–0.07)
Basophils Absolute: 0 10*3/uL (ref 0.0–0.1)
Basophils Relative: 1 %
Eosinophils Absolute: 0.1 10*3/uL (ref 0.0–0.5)
Eosinophils Relative: 3 %
HCT: 37.5 % (ref 36.0–46.0)
Hemoglobin: 11.8 g/dL — ABNORMAL LOW (ref 12.0–15.0)
Immature Granulocytes: 0 %
Lymphocytes Relative: 5 %
Lymphs Abs: 0.2 10*3/uL — ABNORMAL LOW (ref 0.7–4.0)
MCH: 30.8 pg (ref 26.0–34.0)
MCHC: 31.5 g/dL (ref 30.0–36.0)
MCV: 97.9 fL (ref 80.0–100.0)
Monocytes Absolute: 0.6 10*3/uL (ref 0.1–1.0)
Monocytes Relative: 12 %
Neutro Abs: 3.8 10*3/uL (ref 1.7–7.7)
Neutrophils Relative %: 79 %
Platelets: 180 10*3/uL (ref 150–400)
RBC: 3.83 MIL/uL — ABNORMAL LOW (ref 3.87–5.11)
RDW: 19.4 % — ABNORMAL HIGH (ref 11.5–15.5)
WBC: 4.8 10*3/uL (ref 4.0–10.5)
nRBC: 0 % (ref 0.0–0.2)

## 2019-02-27 NOTE — Assessment & Plan Note (Signed)
1.  Anal squamous cell carcinoma, p16 positive: -Biopsy on 12/25/2018 consistent with invasive moderately differentiated keratinizing squamous cell carcinoma. -Colonoscopy 5 years ago in [facility], reportedly normal. -PET scan on 01/15/2019 did not show any evidence of metastatic disease. -XRT and 5-FU and mitomycin started on 02/03/2019. -She tolerated first cycle very well.  Did not experience any GI side effects. -We reviewed her blood counts today.  She will come back on 03/03/2019 to receive her 5-FU and mitomycin day 29. -We will see her back in 3 weeks for blood counts and toxicity assessment.  2.  Multiple sclerosis: -Several years duration, resulting in right-sided weakness, fatigue, memory issues and headaches.  She walks with cane/walker. -Treated with multiple agents including Tecfidera, Betaseron, Avonex, Copaxone.  Also received mitoxantrone in the past. -She is on Ocrevus every 6 months.  3.  Family history: -Mother had breast cancer 3 years ago.  Sister was just diagnosed with breast cancer.

## 2019-02-27 NOTE — Patient Instructions (Signed)
[place] at [facility] Discharge Instructions  You were seen today by Dr. Delton Coombes. He went over your recent lab results. Treatment Monday as scheduled. He will see you back in 3 weeks for labs and follow up.   Thank you for choosing [place] at [facility] to provide your oncology and hematology care.  To afford each patient quality time with our provider, please arrive at least 15 minutes before your scheduled appointment time.   If you have a lab appointment with the [facility] please come in thru the  Main Entrance and check in at the main information desk  You need to re-schedule your appointment should you arrive 10 or more minutes late.  We strive to give you quality time with our providers, and arriving late affects you and other patients whose appointments are after yours.  Also, if you no show three or more times for appointments you may be dismissed from the clinic at the providers discretion.     Again, thank you for choosing [facility].  Our hope is that these requests will decrease the amount of time that you wait before being seen by our physicians.       _____________________________________________________________  Should you have questions after your visit to [facility], please contact our office at [phone] between the hours of 8:00 a.m. and 4:30 p.m.  Voicemails left after 4:00 p.m. will not be returned until the following business day.  For prescription refill requests, have your pharmacy contact our office and allow 72 hours.    Cancer Center Support Programs:   > Cancer Support Group  2nd Tuesday of the month 1pm-2pm, Journey Room

## 2019-02-27 NOTE — Progress Notes (Signed)
[place],  [address]   CLINIC:  Medical Oncology/Hematology  PCP:  [name], MD [place] [address] [phone]   REASON FOR VISIT:  Follow-up for anal cancer.  CURRENT THERAPY: 5-FU and mitomycin.  BRIEF ONCOLOGIC HISTORY:  Oncology History  Anal cancer ([place])  01/10/2019 Initial Diagnosis   Anal cancer ([place])   01/16/2019 Cancer Staging   Staging form: Anus, AJCC 8th Edition - Clinical: Stage Unknown (cTX, cN0, cM0) - Signed by Derek Jack, MD on 01/16/2019   02/03/2019 -  Chemotherapy   The patient had palonosetron (ALOXI) injection 0.25 mg, 0.25 mg, Intravenous,  Once, 1 of 1 cycle Administration: 0.25 mg (02/03/2019) mitoMYcin (MUTAMYCIN) chemo injection 20 mg, 10.1 mg/m2 = 19.5 mg, Intravenous,  Once, 1 of 1 cycle Administration: 20 mg (02/03/2019) fluorouracil (ADRUCIL) 7,900 mg in sodium chloride 0.9 % 92 mL chemo infusion, 1,000 mg/m2/day = 7,900 mg, Intravenous, 4D (96 hours ), 1 of 1 cycle Administration: 7,900 mg (02/03/2019)  for chemotherapy treatment.       CANCER STAGING: Cancer Staging Anal cancer ([place]) Staging form: Anus, AJCC 8th Edition - Clinical: Stage Unknown (cTX, cN0, cM0) - Signed by Derek Jack, MD on 01/16/2019    INTERVAL HISTORY:  Amber Williams 43 y.o. female seen for follow-up of anal squamous cell carcinoma.  Received first cycle of chemotherapy with 5-FU and mitomycin on 02/03/2019.  Did not experience any problems including nausea, vomiting, diarrhea or constipation.  Reports some pain in the perianal region.  Denies any bleeding per rectum or melena.  Appetite is 50%.  Energy levels are 25%.  She is continuing to tolerate radiation therapy well.  Denies any skin peeling in the palms and soles.   REVIEW OF SYSTEMS:  Review of Systems  Musculoskeletal:       Pain in the anal area.  All other systems reviewed and are negative.    PAST MEDICAL/SURGICAL  HISTORY:  Past Medical History:  Diagnosis Date  . Allergy   . Anal cancer ([place]) 01/10/2019  . Arthritis   . Blood transfusion without reported diagnosis    2013  . Depression   . DVT (deep venous thrombosis) ([place]) 2013  . Gait abnormality 08/03/2016  . Hyperlipidemia   . Hypertension   . Hypothyroidism   . Multiple sclerosis ([place])   . Neurogenic bladder 08/03/2016  . Neuromuscular disorder ([place])    dx'd with MS at age of 26  . Port-A-Cath in place 01/23/2019  . PTSD (post-traumatic stress disorder)   . Right foot drop 08/03/2016  . Thyroid disease   . Ulcer    Past Surgical History:  Procedure Laterality Date  . COLON SURGERY     Colonoscopy  . GASTRIC BYPASS  2010  . HEMORRHOID SURGERY N/A 12/25/2018   Procedure: EXTENSIVE HEMORRHOIDECTOMY;  Surgeon: Aviva Signs, MD;  Location: [facility];  Service: General;  Laterality: N/A;  . INTERSTIM IMPLANT PLACEMENT  2014  . PORTACATH PLACEMENT       SOCIAL HISTORY:  Social History   Socioeconomic History  . Marital status: Single    Spouse name: Ricky  . Number of children: 0  . Years of education: 30  . Highest education level: Associate degree: occupational, Hotel manager, or vocational program  Occupational History  . Occupation: Disability  Social Needs  . Financial resource strain: Not hard at all  . Food insecurity    Worry: Never true    Inability: Never  true  . Transportation needs    Medical: No    Non-medical: No  Tobacco Use  . Smoking status: Former Smoker    Packs/day: 1.50    Years: 15.00    Pack years: 22.50    Types: Cigarettes    Start date: 1993    Quit date: 01/30/2009    Years since quitting: 10.0  . Smokeless tobacco: Never Used  Substance and Sexual Activity  . 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 Drug use: No  . Sexual activity: Yes  Lifestyle  . Physical activity    Days per week: 2 days    Minutes per session: 30 min  . Stress: Not at all  Relationships  . Social  connections    Talks on phone: More than three times a week    Gets together: More than three times a week    Attends religious service: Never    Active member of club or organization: No    Attends meetings of clubs or organizations: Never    Relationship status: Living with partner  . Intimate partner violence    Fear of current or ex partner: No    Emotionally abused: No    Physically abused: No    Forced sexual activity: No  Other Topics Concern  . Not on file  Social History Narrative   Lives at home with her boyfriend   Right-handed   Caffeine: 2 glasses per day    FAMILY HISTORY:  Family History  Problem Relation Age of Onset  . Cancer Mother        Dx'd at age 29  . Breast cancer Mother   . Hypertension Father   . Diabetes Father   . Hypertension Sister   . Miscarriages / Stillbirths Sister   . Heart disease Maternal Grandmother   . Diabetes Maternal Grandmother   . Heart disease Maternal Grandfather   . Diabetes Paternal Grandmother   . Heart disease Paternal Grandfather   . Stroke Paternal Grandfather     CURRENT MEDICATIONS:  Outpatient Encounter Medications as of 02/27/2019  Medication Sig  . ALLERGY RELIEF 180 MG tablet Take 180 mg by mouth daily as needed for allergies.   [name] amitriptyline (ELAVIL) 50 MG tablet Take 1 tablet (50 mg total) by mouth at bedtime.  . Cholecalciferol (VITAMIN D3) 5000 units CAPS Take 5,000 Units by mouth every morning.  . ciprofloxacin (CIPRO) 500 MG tablet Take 500 mg by mouth 2 (two) times daily.  [name] docusate sodium (STOOL SOFTENER) 100 MG capsule Take 100 mg by mouth as needed.   [name] escitalopram (LEXAPRO) 10 MG tablet Take 1 tablet (10 mg total) by mouth daily.  . ferrous fumarate (HEMOCYTE - 106 MG FE) 325 (106 Fe) MG TABS tablet Take 325 mg by mouth 2 (two) times daily.   . fluorouracil CALGB 16109 in sodium chloride 0.9 % 150 mL Inject into the vein over 96 hr. Days 1-4 and 28-31  . flurazepam (DALMANE) 30 MG capsule Take 1  capsule (30 mg total) by mouth at bedtime.  [name] levonorgestrel-ethinyl estradiol (NORDETTE) 0.15-30 MG-MCG tablet Take 1 tablet by mouth daily.  [name] levothyroxine (SYNTHROID) 100 MCG tablet Take 1 tablet (100 mcg total) by mouth daily.  [name] MITOMYCIN IV Inject into the vein. Days 1, 28  . Ocrelizumab (OCREVUS IV) Inject into the vein every 6 (six) months.   [name] omeprazole (PRILOSEC) 20 MG capsule Take 1 capsule (20 mg total)  by mouth daily.  . vitamin B-12 (CYANOCOBALAMIN) 500 MCG tablet Take 500 mcg by mouth daily.  [name] HYDROcodone-acetaminophen (NORCO) 10-325 MG tablet Take 1 tablet by mouth at bedtime as needed. (Patient not taking: Reported on 02/27/2019)  . hydrocortisone (ANUSOL-HC) 2.5 % rectal cream Place 1 application rectally 2 (two) times daily. (Patient not taking: Reported on 02/27/2019)  . lidocaine-prilocaine (EMLA) cream Apply a small amount to port a cath site and cover with plastic wrap one hour prior to chemotherapy appointments (Patient not taking: Reported on 02/27/2019)  . loperamide (IMODIUM) 1 MG/5ML solution Take 6 mg by mouth as needed for diarrhea or loose stools.   . mometasone (NASONEX) 50 MCG/ACT nasal spray Place 2 sprays into the nose 2 (two) times daily as needed (allergies).   . prochlorperazine (COMPAZINE) 10 MG tablet Take 1 tablet (10 mg total) by mouth every 6 (six) hours as needed (Nausea or vomiting).  . [DISCONTINUED] nitrofurantoin, macrocrystal-monohydrate, (MACROBID) 100 MG capsule Take 100 mg by mouth 2 (two) times daily.  . [DISCONTINUED] predniSONE (DELTASONE) 5 MG tablet Begin taking 6 tablets daily, taper by one tablet every other day until off the medication.   No facility-administered encounter medications on file as of 02/27/2019.     ALLERGIES:  Allergies  Allergen Reactions  . Other Hives    Plastic Tape  . Tecfidera [Dimethyl Fumarate] Itching     PHYSICAL EXAM:  ECOG Performance status: 1  Vitals:   02/27/19 1232  BP: 129/83  Pulse: 79   Resp: 18  Temp: (!) 97.5 F (36.4 C)  SpO2: 99%   Filed Weights   02/27/19 1232  Weight: 189 lb (85.7 kg)    Physical Exam Vitals signs reviewed.  Constitutional:      Appearance: Normal appearance.  Cardiovascular:     Rate and Rhythm: Normal rate and regular rhythm.     Heart sounds: Normal heart sounds.  Pulmonary:     Breath sounds: Normal breath sounds.  Abdominal:     General: There is no distension.     Palpations: Abdomen is soft. There is no mass.  Musculoskeletal:        General: No swelling.  Skin:    General: Skin is warm.  Neurological:     General: No focal deficit present.     Mental Status: She is alert and oriented to person, place, and time.  Psychiatric:        Mood and Affect: Mood normal.        Behavior: Behavior normal.      LABORATORY DATA:  I have reviewed the labs as listed.  CBC    Component Value Date/Time   WBC 4.8 02/27/2019 1035   RBC 3.83 (L) 02/27/2019 1035   HGB 11.8 (L) 02/27/2019 1035   HGB 10.2 (L) 12/04/2018 1427   HCT 37.5 02/27/2019 1035   HCT 34.4 12/04/2018 1427   PLT 180 02/27/2019 1035   PLT 453 (H) 12/04/2018 1427   MCV 97.9 02/27/2019 1035   MCV 86 12/04/2018 1427   MCH 30.8 02/27/2019 1035   MCHC 31.5 02/27/2019 1035   RDW 19.4 (H) 02/27/2019 1035   RDW 14.3 12/04/2018 1427   LYMPHSABS 0.2 (L) 02/27/2019 1035   LYMPHSABS 1.4 12/04/2018 1427   MONOABS 0.6 02/27/2019 1035   EOSABS 0.1 02/27/2019 1035   EOSABS 0.1 12/04/2018 1427   BASOSABS 0.0 02/27/2019 1035   BASOSABS 0.1 12/04/2018 1427   CMP Latest Ref Rng & Units 02/27/2019 02/11/2019 02/03/2019  Glucose 70 - 99 mg/dL 58(L) 95 104(H)  BUN 6 - 20 mg/dL 6 12 13   Creatinine 0.44 - 1.00 mg/dL 0.75 0.82 0.82  Sodium 135 - 145 mmol/L 142 139 139  Potassium 3.5 - 5.1 mmol/L 3.5 3.5 3.7  Chloride 98 - 111 mmol/L 108 107 108  CO2 22 - 32 mmol/L 27 24 23   Calcium 8.9 - 10.3 mg/dL 9.3 9.3 9.1  Total Protein 6.5 - 8.1 g/dL 5.5(L) 5.5(L) 5.7(L)  Total  Bilirubin 0.3 - 1.2 mg/dL 0.3 0.3 0.4  Alkaline Phos 38 - 126 U/L 57 49 58  AST 15 - 41 U/L 15 18 19   ALT 0 - 44 U/L 14 15 17        DIAGNOSTIC IMAGING:  I have independently reviewed the scans and discussed with the patient.     ASSESSMENT & PLAN:   Anal cancer ([place]) 1.  Anal squamous cell carcinoma, p16 positive: -Biopsy on 12/25/2018 consistent with invasive moderately differentiated keratinizing squamous cell carcinoma. -Colonoscopy 5 years ago in [facility], reportedly normal. -PET scan on 01/15/2019 did not show any evidence of metastatic disease. -XRT and 5-FU and mitomycin started on 02/03/2019. -She tolerated first cycle very well.  Did not experience any GI side effects. -We reviewed her blood counts today.  She will come back on 03/03/2019 to receive her 5-FU and mitomycin day 29. -We will see her back in 3 weeks for blood counts and toxicity assessment.  2.  Multiple sclerosis: -Several years duration, resulting in right-sided weakness, fatigue, memory issues and headaches.  She walks with cane/walker. -Treated with multiple agents including Tecfidera, Betaseron, Avonex, Copaxone.  Also received mitoxantrone in the past. -She is on Ocrevus every 6 months.  3.  Family history: -Mother had breast cancer 3 years ago.  Sister was just diagnosed with breast cancer.   Total time spent is 25 minutes with more than 50% of the time spent face-to-face discussing treatment plan, counseling and coordination of care.  Orders placed this encounter:  No orders of the defined types were placed in this encounter.     Derek Jack, MD [place] [phone]

## 2019-03-03 ENCOUNTER — Inpatient Hospital Stay (HOSPITAL_COMMUNITY): Payer: Medicare HMO | Attending: Hematology

## 2019-03-03 ENCOUNTER — Encounter (HOSPITAL_COMMUNITY): Payer: Self-pay

## 2019-03-03 ENCOUNTER — Other Ambulatory Visit: Payer: Self-pay

## 2019-03-03 VITALS — BP 116/44 | HR 81 | Temp 97.0°F | Resp 18 | Wt 192.6 lb

## 2019-03-03 DIAGNOSIS — Z5111 Encounter for antineoplastic chemotherapy: Secondary | ICD-10-CM | POA: Diagnosis present

## 2019-03-03 DIAGNOSIS — Z95828 Presence of other vascular implants and grafts: Secondary | ICD-10-CM

## 2019-03-03 DIAGNOSIS — C210 Malignant neoplasm of anus, unspecified: Secondary | ICD-10-CM | POA: Diagnosis present

## 2019-03-03 DIAGNOSIS — Z452 Encounter for adjustment and management of vascular access device: Secondary | ICD-10-CM | POA: Insufficient documentation

## 2019-03-03 MED ORDER — SODIUM CHLORIDE 0.9 % IV SOLN
1000.0000 mg/m2/d | INTRAVENOUS | Status: DC
  Administered 2019-03-03: 7900 mg via INTRAVENOUS
  Filled 2019-03-03: qty 100

## 2019-03-03 MED ORDER — SODIUM CHLORIDE 0.9% FLUSH
10.0000 mL | INTRAVENOUS | Status: DC | PRN
  Administered 2019-03-03: 10 mL
  Filled 2019-03-03: qty 10

## 2019-03-03 MED ORDER — PALONOSETRON HCL INJECTION 0.25 MG/5ML
0.2500 mg | Freq: Once | INTRAVENOUS | Status: AC
  Administered 2019-03-03: 0.25 mg via INTRAVENOUS
  Filled 2019-03-03: qty 5

## 2019-03-03 MED ORDER — HEPARIN SOD (PORK) LOCK FLUSH 100 UNIT/ML IV SOLN: 500.0000 [IU] | Freq: Once | INTRAVENOUS | Status: DC | PRN

## 2019-03-03 MED ORDER — SODIUM CHLORIDE 0.9 % IV SOLN
10.0000 mg | Freq: Once | INTRAVENOUS | Status: AC
  Administered 2019-03-03: 10 mg via INTRAVENOUS
  Filled 2019-03-03: qty 10

## 2019-03-03 MED ORDER — SODIUM CHLORIDE 0.9 % IV SOLN
Freq: Once | INTRAVENOUS | Status: AC
  Administered 2019-03-03: 11:00:00 via INTRAVENOUS

## 2019-03-03 MED ORDER — MITOMYCIN CHEMO IV INJECTION 20 MG
10.1000 mg/m2 | Freq: Once | INTRAVENOUS | Status: AC
  Administered 2019-03-03: 20 mg via INTRAVENOUS
  Filled 2019-03-03: qty 40

## 2019-03-03 NOTE — Progress Notes (Signed)
To treatment room after XRT for chemotherapy.  No complaints voiced.  No s/s of distress noted.    Patient tolerated chemotherapy push with no complaints voiced.  Port site clean and dry with no bruising or swelling noted at site.  Good blood return noted before and after administration of chemotherapy. Chemotherapy pump connected with no alarms noted.  Patient left by wheelchair with VSS and no s/s of distress noted.

## 2019-03-07 ENCOUNTER — Other Ambulatory Visit: Payer: Self-pay

## 2019-03-07 ENCOUNTER — Inpatient Hospital Stay (HOSPITAL_COMMUNITY): Payer: Medicare HMO

## 2019-03-07 VITALS — BP 116/54 | HR 92 | Temp 98.7°F | Resp 18

## 2019-03-07 DIAGNOSIS — C210 Malignant neoplasm of anus, unspecified: Secondary | ICD-10-CM

## 2019-03-07 DIAGNOSIS — Z452 Encounter for adjustment and management of vascular access device: Secondary | ICD-10-CM | POA: Diagnosis not present

## 2019-03-07 DIAGNOSIS — Z95828 Presence of other vascular implants and grafts: Secondary | ICD-10-CM

## 2019-03-07 MED ORDER — HEPARIN SOD (PORK) LOCK FLUSH 100 UNIT/ML IV SOLN
500.0000 [IU] | Freq: Once | INTRAVENOUS | Status: AC | PRN
  Administered 2019-03-07: 500 [IU]

## 2019-03-07 MED ORDER — SODIUM CHLORIDE 0.9% FLUSH
10.0000 mL | INTRAVENOUS | Status: DC | PRN
  Administered 2019-03-07: 10 mL
  Filled 2019-03-07: qty 10

## 2019-03-07 NOTE — Patient Instructions (Signed)
[facility] Discharge Instructions for Patients Receiving Chemotherapy  Today you received the following chemotherapy agents   To help prevent nausea and vomiting after your treatment, we encourage you to take your nausea medication   If you develop nausea and vomiting that is not controlled by your nausea medication, call the clinic.   BELOW ARE SYMPTOMS THAT SHOULD BE REPORTED IMMEDIATELY:  *FEVER GREATER THAN 100.5 F  *CHILLS WITH OR WITHOUT FEVER  NAUSEA AND VOMITING THAT IS NOT CONTROLLED WITH YOUR NAUSEA MEDICATION  *UNUSUAL SHORTNESS OF BREATH  *UNUSUAL BRUISING OR BLEEDING  TENDERNESS IN MOUTH AND THROAT WITH OR WITHOUT PRESENCE OF ULCERS  *URINARY PROBLEMS  *BOWEL PROBLEMS  UNUSUAL RASH Items with * indicate a potential emergency and should be followed up as soon as possible.  Feel free to call the clinic should you have any questions or concerns. The clinic phone number is [phone].  Please show the CHEMO ALERT CARD at check-in to the Emergency Department and triage nurse.   

## 2019-03-07 NOTE — Progress Notes (Signed)
Amber Williams presented for pump d/c and port  flush. Portacath located in the right chest wall.  Clean, Dry and Intact Good blood return present. Portacath flushed with 37ml NS and 500U/10ml Heparin per protocol and needle removed intact. Procedure without incident. Patient tolerated procedure well.   No complaints at this time. Discharged from clinic via wheel chair. F/U with [facility] as scheduled.

## 2019-03-11 ENCOUNTER — Ambulatory Visit: Payer: Medicare HMO | Admitting: Family Medicine

## 2019-03-12 ENCOUNTER — Other Ambulatory Visit: Payer: Self-pay

## 2019-03-13 ENCOUNTER — Ambulatory Visit (INDEPENDENT_AMBULATORY_CARE_PROVIDER_SITE_OTHER): Payer: Medicare HMO | Admitting: Family Medicine

## 2019-03-13 ENCOUNTER — Encounter: Payer: Self-pay | Admitting: Family Medicine

## 2019-03-13 VITALS — BP 101/65 | HR 87 | Temp 96.6°F | Ht 65.0 in | Wt 185.0 lb

## 2019-03-13 DIAGNOSIS — G35 Multiple sclerosis: Secondary | ICD-10-CM | POA: Diagnosis not present

## 2019-03-13 DIAGNOSIS — C210 Malignant neoplasm of anus, unspecified: Secondary | ICD-10-CM

## 2019-03-13 DIAGNOSIS — F4310 Post-traumatic stress disorder, unspecified: Secondary | ICD-10-CM

## 2019-03-13 DIAGNOSIS — F5101 Primary insomnia: Secondary | ICD-10-CM

## 2019-03-13 DIAGNOSIS — E039 Hypothyroidism, unspecified: Secondary | ICD-10-CM

## 2019-03-13 DIAGNOSIS — D508 Other iron deficiency anemias: Secondary | ICD-10-CM

## 2019-03-13 NOTE — Progress Notes (Signed)
Subjective:  Patient ID: Amber Williams, female    DOB: 1975/10/14  Age: 43 y.o. MRN: [id number]  CC: Hypothyroidism and Iron levels   HPI Amber Williams presents for follow up after finishing chemo and radiation for anal Ca last week. She reports that she had minimal nausea, but perineal regoin is quite tender. She has been taking iron for anemia. Denies symptoms. Due for recheck of iron group.  She would like blood work for Amber Williams as her sister tested positive for Amber Williams gene after dx with Ca Breast. Mother had Ca breast, but did not have genetic testing. Pt. Has no breast Ca known. She would like an estrogen level as her sister and mother had high levels at time of diagnosis.   follow-up on  thyroid. The patient has a history of hypothyroidism for many years. It has been stable recently. Pt. denies any change in  voice, loss of hair, heat or cold intolerance. Energy level has been adequate to good. Patient denies constipation and diarrhea. No myxedema. Medication is as noted below. Verified that pt is taking it daily on an empty stomach. Well tolerated.   Depression screen Amber Williams 2/9 03/13/2019 12/04/2018 11/22/2018  Decreased Interest 0 0 0  Down, Depressed, Hopeless 1 0 0  PHQ - 2 Score 1 0 0  Altered sleeping - - -  Tired, decreased energy - - -  Change in appetite - - -  Feeling bad or failure about yourself  - - -  Trouble concentrating - - -  Moving slowly or fidgety/restless - - -  Suicidal thoughts - - -  PHQ-9 Score - - -    History Amber Williams has a past medical history of Allergy, Anal cancer ([place]) (01/10/2019), Arthritis, Blood transfusion without reported diagnosis, Depression, DVT (deep venous thrombosis) ([place]) (2013), Gait abnormality (08/03/2016), Hyperlipidemia, Hypertension, Hypothyroidism, Multiple sclerosis ([place]), Neurogenic bladder (08/03/2016), Neuromuscular disorder ([place]), Port-A-Cath in place (01/23/2019), PTSD (post-traumatic stress disorder), Right foot drop (08/03/2016),  Thyroid disease, and Ulcer.   She has a past surgical history that includes Colon surgery; Gastric bypass (2010); Interstim Implant placement (2014); Portacath placement; and Hemorrhoid surgery (N/A, 12/25/2018).   Her family history includes Breast cancer in her mother; Cancer in her mother; Diabetes in her father, maternal grandmother, and paternal grandmother; Heart disease in her maternal grandfather, maternal grandmother, and paternal grandfather; Hypertension in her father and sister; Miscarriages / [name] in her sister; Stroke in her paternal grandfather.She reports that she quit smoking about 10 years ago. Her smoking use included cigarettes. She started smoking about 27 years ago. She has a 22.50 pack-year smoking history. She has never used smokeless tobacco. She reports previous alcohol use. She reports that she does not use drugs.    ROS Review of Systems  Constitutional: Negative.   HENT: Negative for congestion.   Eyes: Negative for visual disturbance.  Respiratory: Negative for shortness of breath.   Cardiovascular: Negative for chest pain.  Gastrointestinal: Negative for abdominal pain, constipation, diarrhea, nausea and vomiting.  Genitourinary: Positive for vaginal pain (due to radiation). Negative for difficulty urinating and menstrual problem.  Musculoskeletal: Positive for arthralgias, gait problem and myalgias.  Neurological: Negative for headaches.  Psychiatric/Behavioral: Positive for sleep disturbance.    Objective:  BP 101/65    Pulse 87    Temp (!) 96.6 F (35.9 C) (Temporal)    Ht 5\' 5"  (1.651 m)    Wt 185 lb (83.9 kg)    SpO2 99%    BMI  30.79 kg/m   BP Readings from Last 3 Encounters:  03/13/19 101/65  03/07/19 (!) 116/54  03/03/19 (!) 116/44    Wt Readings from Last 3 Encounters:  03/13/19 185 lb (83.9 kg)  03/03/19 192 lb 9.6 oz (87.4 kg)  02/27/19 189 lb (85.7 kg)     Physical Exam Constitutional:      General: She is not in acute  distress.    Appearance: She is well-developed.  HENT:     Head: Normocephalic and atraumatic.     Right Ear: External ear normal.     Left Ear: External ear normal.     Nose: Nose normal.  Eyes:     Conjunctiva/sclera: Conjunctivae normal.     Pupils: Pupils are equal, round, and reactive to light.  Neck:     Musculoskeletal: Normal range of motion and neck supple.     Thyroid: No thyromegaly.  Cardiovascular:     Rate and Rhythm: Normal rate and regular rhythm.     Heart sounds: Normal heart sounds. No murmur.  Pulmonary:     Effort: Pulmonary effort is normal. No respiratory distress.     Breath sounds: Normal breath sounds. No wheezing or rales.  Abdominal:     General: Bowel sounds are normal. There is no distension.     Palpations: Abdomen is soft.     Tenderness: There is no abdominal tenderness.  Lymphadenopathy:     Cervical: No cervical adenopathy.  Skin:    General: Skin is warm and dry.  Neurological:     Mental Status: She is alert and oriented to person, place, and time.     Deep Tendon Reflexes: Reflexes are normal and symmetric.  Psychiatric:        Behavior: Behavior normal.        Thought Content: Thought content normal.        Judgment: Judgment normal.       Assessment & Plan:   Amoria was seen today for hypothyroidism and iron levels.  Diagnoses and all orders for this visit:  Anal cancer ([place]) -     CMP -     TSH + free T4 -     Anemia Profile with CBC -     Ambulatory referral to Chronic Care Management Services -     Estrogen  Hypothyroidism, unspecified type -     CMP -     TSH + free T4 -     Anemia Profile with CBC -     Ambulatory referral to Chronic Care Management Services -     Estrogen  Multiple sclerosis ([place]) -     CMP -     TSH + free T4 -     Anemia Profile with CBC -     Ambulatory referral to Chronic Care Management Services  PTSD (post-traumatic stress disorder) -     CMP -     TSH + free T4 -     Anemia Profile  with CBC -     Ambulatory referral to Chronic Care Management Services  Primary insomnia -     CMP -     TSH + free T4 -     Anemia Profile with CBC -     Ambulatory referral to Chronic Care Management Services  Other iron deficiency anemia -     CMP -     TSH + free T4 -     Anemia Profile with CBC -  Ambulatory referral to Chronic Care Management Services -     Estrogen       I have discontinued Tareva D. Rezek's hydrocortisone and docusate sodium. [name] Vitamin D3, Ocrelizumab (OCREVUS IV), vitamin B-12, Allergy Relief, mometasone, levonorgestrel-ethinyl estradiol, omeprazole, levothyroxine, amitriptyline, flurazepam, ferrous fumarate, loperamide, HYDROcodone-acetaminophen, lidocaine-prilocaine, prochlorperazine, MITOMYCIN IV, fluorouracil CALGB 16109 in sodium chloride 0.9 % 150 mL, escitalopram, and ciprofloxacin.  Allergies as of 03/13/2019      Reactions   Other Hives   Plastic Tape   Tecfidera [dimethyl Fumarate] Itching      Medication List       Accurate as of March 13, 2019  2:03 PM. If you have any questions, ask your nurse or doctor.        STOP taking these medications   hydrocortisone 2.5 % rectal cream Commonly known as: ANUSOL-HC Stopped by: [name], MD   Stool Softener 100 MG capsule Generic drug: docusate sodium Stopped by: [name], MD     TAKE these medications   Allergy Relief 180 MG tablet Generic drug: fexofenadine Take 180 mg by mouth daily as needed for allergies.   amitriptyline 50 MG tablet Commonly known as: ELAVIL Take 1 tablet (50 mg total) by mouth at bedtime.   ciprofloxacin 500 MG tablet Commonly known as: CIPRO Take 500 mg by mouth 2 (two) times daily.   escitalopram 10 MG tablet Commonly known as: LEXAPRO Take 1 tablet (10 mg total) by mouth daily.   ferrous fumarate 325 (106 Fe) MG Tabs tablet Commonly known as: HEMOCYTE - 106 mg FE Take 325 mg by mouth 2 (two) times  daily.   fluorouracil CALGB 60454 in sodium chloride 0.9 % 150 mL Inject into the vein over 96 hr. Days 1-4 and 28-31   flurazepam 30 MG capsule Commonly known as: DALMANE Take 1 capsule (30 mg total) by mouth at bedtime.   HYDROcodone-acetaminophen 10-325 MG tablet Commonly known as: Norco Take 1 tablet by mouth at bedtime as needed.   levonorgestrel-ethinyl estradiol 0.15-30 MG-MCG tablet Commonly known as: NORDETTE Take 1 tablet by mouth daily.   levothyroxine 100 MCG tablet Commonly known as: SYNTHROID Take 1 tablet (100 mcg total) by mouth daily.   lidocaine-prilocaine cream Commonly known as: EMLA Apply a small amount to port a cath site and cover with plastic wrap one hour prior to chemotherapy appointments   loperamide 1 MG/5ML solution Commonly known as: IMODIUM Take 6 mg by mouth as needed for diarrhea or loose stools.   MITOMYCIN IV Inject into the vein. Days 1, 28   mometasone 50 MCG/ACT nasal spray Commonly known as: NASONEX Place 2 sprays into the nose 2 (two) times daily as needed (allergies).   OCREVUS IV Inject into the vein every 6 (six) months.   omeprazole 20 MG capsule Commonly known as: PRILOSEC Take 1 capsule (20 mg total) by mouth daily.   prochlorperazine 10 MG tablet Commonly known as: COMPAZINE Take 1 tablet (10 mg total) by mouth every 6 (six) hours as needed (Nausea or vomiting).   vitamin B-12 500 MCG tablet Commonly known as: CYANOCOBALAMIN Take 500 mcg by mouth daily.   Vitamin D3 125 MCG (5000 UT) Caps Take 5,000 Units by mouth every morning.        Follow-up: No follow-ups on file.  [name], M.D.

## 2019-03-14 LAB — CMP14+EGFR
ALT: 11 IU/L (ref 0–32)
AST: 11 IU/L (ref 0–40)
Albumin/Globulin Ratio: 2.7 — ABNORMAL HIGH (ref 1.2–2.2)
Albumin: 3.5 g/dL — ABNORMAL LOW (ref 3.8–4.8)
Alkaline Phosphatase: 63 IU/L (ref 39–117)
BUN/Creatinine Ratio: 11 (ref 9–23)
BUN: 9 mg/dL (ref 6–24)
Bilirubin Total: 0.2 mg/dL (ref 0.0–1.2)
CO2: 21 mmol/L (ref 20–29)
Calcium: 9.4 mg/dL (ref 8.7–10.2)
Chloride: 105 mmol/L (ref 96–106)
Creatinine, Ser: 0.82 mg/dL (ref 0.57–1.00)
GFR calc Af Amer: 101 mL/min/{1.73_m2} (ref >59)
GFR calc non Af Amer: 88 mL/min/{1.73_m2} (ref >59)
Globulin, Total: 1.3 g/dL — ABNORMAL LOW (ref 1.5–4.5)
Glucose: 71 mg/dL (ref 65–99)
Potassium: 3.6 mmol/L (ref 3.5–5.2)
Sodium: 140 mmol/L (ref 134–144)
Total Protein: 4.8 g/dL — ABNORMAL LOW (ref 6.0–8.5)

## 2019-03-14 LAB — ANEMIA PROFILE B
Basophils Absolute: 0 10*3/uL (ref 0.0–0.2)
Basos: 0 %
EOS (ABSOLUTE): 0.4 10*3/uL (ref 0.0–0.4)
Eos: 8 %
Ferritin: 148 ng/mL (ref 15–150)
Folate: 4.5 ng/mL (ref >3.0)
Hematocrit: 35.5 % (ref 34.0–46.6)
Hemoglobin: 11.5 g/dL (ref 11.1–15.9)
Immature Grans (Abs): 0 10*3/uL (ref 0.0–0.1)
Immature Granulocytes: 0 %
Iron Saturation: 24 % (ref 15–55)
Iron: 68 ug/dL (ref 27–159)
Lymphocytes Absolute: 0.1 10*3/uL — ABNORMAL LOW (ref 0.7–3.1)
Lymphs: 3 %
MCH: 31.3 pg (ref 26.6–33.0)
MCHC: 32.4 g/dL (ref 31.5–35.7)
MCV: 97 fL (ref 79–97)
Monocytes Absolute: 0.3 10*3/uL (ref 0.1–0.9)
Monocytes: 6 %
Neutrophils Absolute: 3.8 10*3/uL (ref 1.4–7.0)
Neutrophils: 83 %
Platelets: 218 10*3/uL (ref 150–450)
RBC: 3.68 x10E6/uL — ABNORMAL LOW (ref 3.77–5.28)
RDW: 16.5 % — ABNORMAL HIGH (ref 11.7–15.4)
Retic Ct Pct: 0.7 % (ref 0.6–2.6)
Total Iron Binding Capacity: 287 ug/dL (ref 250–450)
UIBC: 219 ug/dL (ref 131–425)
Vitamin B-12: >2000 pg/mL — ABNORMAL HIGH (ref 232–1245)
WBC: 4.5 10*3/uL (ref 3.4–10.8)

## 2019-03-14 LAB — TSH+FREE T4
Free T4: 1.31 ng/dL (ref 0.82–1.77)
TSH: 5.03 u[IU]/mL — ABNORMAL HIGH (ref 0.450–4.500)

## 2019-03-17 ENCOUNTER — Other Ambulatory Visit: Payer: Self-pay | Admitting: *Deleted

## 2019-03-17 ENCOUNTER — Other Ambulatory Visit: Payer: Self-pay | Admitting: Family Medicine

## 2019-03-17 ENCOUNTER — Ambulatory Visit: Payer: Self-pay | Admitting: *Deleted

## 2019-03-17 DIAGNOSIS — E039 Hypothyroidism, unspecified: Secondary | ICD-10-CM

## 2019-03-17 LAB — ESTROGENS, TOTAL: Estrogen: 80 pg/mL

## 2019-03-17 LAB — SPECIMEN STATUS REPORT

## 2019-03-17 MED ORDER — LEVOTHYROXINE SODIUM 125 MCG PO TABS: 125.0000 ug | ORAL_TABLET | Freq: Every day | ORAL | 1 refills | Status: DC

## 2019-03-17 NOTE — Chronic Care Management (AMB) (Signed)
  Chronic Care Management   Referral Review Note  03/17/2019 Name: Amber Williams MRN: [id number] DOB: 03-Jun-1975   Referral from PCP, Dr Livia Snellen, for CM support from RN Case Manager Licensed Clinical Social Worker reviewed.  Referring Diagnoses: C21.0 (ICD-10-CM) - Anal cancer ([place]) E03.9 (ICD-10-CM) - Hypothyroidism, unspecified type G35 (ICD-10-CM) - Multiple sclerosis ([place]) F43.10 (ICD-10-CM) - PTSD (post-traumatic stress disorder) F51.01 (ICD-10-CM) - Primary insomnia D50.8 (ICD-10-CM) - Other iron deficiency anemia    Referral priority indicated: Routine (anticipate clinician appointment scheduling within 28 business days)   Plan: Initial outreach scheduled with RN on 03/24/2019   [name], BSN, RN-BC [place] [facility] [place] Management Direct Dial: [phone]

## 2019-03-18 ENCOUNTER — Other Ambulatory Visit: Payer: Self-pay | Admitting: Family Medicine

## 2019-03-18 ENCOUNTER — Ambulatory Visit: Payer: Self-pay | Admitting: Licensed Clinical Social Worker

## 2019-03-18 DIAGNOSIS — E039 Hypothyroidism, unspecified: Secondary | ICD-10-CM

## 2019-03-18 DIAGNOSIS — C210 Malignant neoplasm of anus, unspecified: Secondary | ICD-10-CM

## 2019-03-18 DIAGNOSIS — G35 Multiple sclerosis: Secondary | ICD-10-CM

## 2019-03-18 DIAGNOSIS — F331 Major depressive disorder, recurrent, moderate: Secondary | ICD-10-CM

## 2019-03-18 DIAGNOSIS — F4310 Post-traumatic stress disorder, unspecified: Secondary | ICD-10-CM

## 2019-03-18 NOTE — Chronic Care Management (AMB) (Signed)
  Care Management Note   Amber Williams is a 43 y.o. year old female who is a primary care patient of [name], [name], MD. The CM team was consulted for assistance with chronic disease management and care coordination.   I reached out to Amber Williams by phone today.   Review of patient status, including review of consultants reports, relevant laboratory and other test results, and collaboration with appropriate care team members and the patient's provider was performed as part of comprehensive patient evaluation and provision of chronic care management services.   Medications   New medications from outside sources are available for reconciliation   ALLERGY RELIEF 180 MG tablet    amitriptyline (ELAVIL) 50 MG tablet    Cholecalciferol (VITAMIN D3) 5000 units CAPS    ciprofloxacin (CIPRO) 500 MG tablet    escitalopram (LEXAPRO) 10 MG tablet    ferrous fumarate (HEMOCYTE - 106 MG FE) 325 (106 Fe) MG TABS tablet    fluorouracil CALGB 13086 in sodium chloride 0.9 % 150 mL    flurazepam (DALMANE) 30 MG capsule    HYDROcodone-acetaminophen (NORCO) 10-325 MG tablet    levonorgestrel-ethinyl estradiol (NORDETTE) 0.15-30 MG-MCG tablet    levothyroxine (SYNTHROID) 125 MCG tablet    lidocaine-prilocaine (EMLA) cream    loperamide (IMODIUM) 1 MG/5ML solution    MITOMYCIN IV    mometasone (NASONEX) 50 MCG/ACT nasal spray    Ocrelizumab (OCREVUS IV)    omeprazole (PRILOSEC) 20 MG capsule    prochlorperazine (COMPAZINE) 10 MG tablet    vitamin B-12 (CYANOCOBALAMIN) 500 MCG tablet    LCSW called client phone numbers several times today but LCSW was not able to speak via phone with client. LCSW did leave phone message for client requesting client please return call to LCSW at [phone] to talk with LCSW about CCM program services available.  Follow Up Plan:  LCSW to call client in next 2 weeks to talk with client about CCM program services available.  [name].Kellie Chisolm MSW, LCSW Licensed  Clinical Social Worker [facility] [phone]

## 2019-03-18 NOTE — Patient Instructions (Addendum)
Licensed Clinical Social Worker Visit Information  Materials Provided: No  Amber Williams is a 43 y.o. year old female who is a primary care patient of [name], [name], MD. The CM team was consulted for assistance with chronic disease management and care coordination.   I reached out to [name] by phone today.   LCSW called client phone numbers several times today but LCSW was not able to speak via phone with client. LCSW did leave phone message for client requesting client please return call to LCSW at [phone] to talk with LCSW about CCM program services available.  Follow Up Plan: LCSW to call client in next 2 weeks to talk with client about CCM program services available  LCSW was not able to speak via phone with client today. Thus the patient was not able to  verbalize understanding of instructions provided today and was not able to accept or decline a print copy of patient instruction materials.   [name].Zarayah Lanting MSW, LCSW Licensed Clinical Social Worker [facility] [phone]

## 2019-03-24 ENCOUNTER — Encounter: Payer: Self-pay | Admitting: Adult Health

## 2019-03-24 ENCOUNTER — Ambulatory Visit: Payer: Medicare HMO | Admitting: Adult Health

## 2019-03-24 ENCOUNTER — Other Ambulatory Visit: Payer: Self-pay

## 2019-03-24 ENCOUNTER — Ambulatory Visit (HOSPITAL_COMMUNITY): Payer: Medicare HMO | Admitting: Hematology

## 2019-03-24 ENCOUNTER — Other Ambulatory Visit (HOSPITAL_COMMUNITY): Payer: Medicare HMO

## 2019-03-24 ENCOUNTER — Telehealth: Payer: Medicare HMO | Admitting: *Deleted

## 2019-03-24 VITALS — BP 132/88 | HR 84 | Temp 97.0°F | Ht 65.0 in | Wt 179.1 lb

## 2019-03-24 DIAGNOSIS — M21371 Foot drop, right foot: Secondary | ICD-10-CM | POA: Diagnosis not present

## 2019-03-24 DIAGNOSIS — G35 Multiple sclerosis: Secondary | ICD-10-CM | POA: Diagnosis not present

## 2019-03-24 DIAGNOSIS — R269 Unspecified abnormalities of gait and mobility: Secondary | ICD-10-CM | POA: Diagnosis not present

## 2019-03-24 NOTE — Progress Notes (Signed)
PATIENT: Amber Williams DOB: 03-Feb-1976  REASON FOR VISIT: follow up HISTORY FROM: patient  HISTORY OF PRESENT ILLNESS: Today 03/24/19:  Amber Williams is a 43 year old female with a history of multiple sclerosis associated with a gait disorder.  She returns today for follow-up.  She states starting about 4 to 5 days ago she began having trouble walking.  She states that she is dragging the right foot.  She states that she feels weaker in the right leg.  She had her last round of chemo the week of November 2.  She states that she does not have any more chemo radiation schedule.  She was advised by Dr. Jannifer Franklin that she cannot have Ocrevus until 6 months after her last chemo/radiation treatment.  Denies any changes with her vision.  No changes with the bowels or bladder.  She denies any new numbness or weakness.  She states that she last had prednisone in July but felt that it offered her no benefit.  She states that she got IV Solu-Medrol in March and it was helpful.  She is asking for another round of IV Solu-Medrol.  She returns today for an evaluation.  HISTORY Amber Williams is a 43 year old right-handed white female with a history of multiple sclerosis associated with a gait disorder with right greater than left lower extremity weakness.  The patient had been seen on 10 July 2018 with some worsening weakness and increasing problems with walking.  The patient was given a course of prednisone, she was to be set up for physical therapy but this never occurred.  The patient has undergone MRI of the brain in April 2020, this showed no acute changes.  The patient has never completely returned to normal since that last attack, she got her last Ocrevus injection on 18 Sep 2018.  The patient usually feels better for several months after the injection, this did not happen with the last injection.  The patient is controlling the bladder fairly well, she has had some frequency of bowel movements, she tends  to have a bowel movement about an hour after she eats.  She has had 1 or 2 falls since last seen, she did not sustain significant injury from this, she usually uses a walker or cane for ambulation, she does have a foot drop on the right and has an AFO brace.   REVIEW OF SYSTEMS: Out of a complete 14 system review of symptoms, the patient complains only of the following symptoms, and all other reviewed systems are negative.  See HPI  ALLERGIES: Allergies  Allergen Reactions   Other Hives    Plastic Tape   Tecfidera [Dimethyl Fumarate] Itching    HOME MEDICATIONS: Outpatient Medications Prior to Visit  Medication Sig Dispense Refill   ALLERGY RELIEF 180 MG tablet Take 180 mg by mouth daily as needed for allergies.      amitriptyline (ELAVIL) 50 MG tablet Take 1 tablet (50 mg total) by mouth at bedtime. 30 tablet 5   Cholecalciferol (VITAMIN D3) 5000 units CAPS Take 5,000 Units by mouth every morning.     ciprofloxacin (CIPRO) 500 MG tablet Take 500 mg by mouth 2 (two) times daily.     escitalopram (LEXAPRO) 10 MG tablet Take 1 tablet (10 mg total) by mouth daily. 90 tablet 1   flurazepam (DALMANE) 30 MG capsule Take 1 capsule (30 mg total) by mouth at bedtime. 30 capsule 2   levonorgestrel-ethinyl estradiol (NORDETTE) 0.15-30 MG-MCG tablet Take 1 tablet by mouth  daily. 1 Package 11   loperamide (IMODIUM) 1 MG/5ML solution Take 6 mg by mouth as needed for diarrhea or loose stools.      mometasone (NASONEX) 50 MCG/ACT nasal spray Place 2 sprays into the nose 2 (two) times daily as needed (allergies).      Ocrelizumab (OCREVUS IV) Inject into the vein every 6 (six) months.      omeprazole (PRILOSEC) 20 MG capsule TAKE 1 CAPSULE DAILY 90 capsule 0   vitamin B-12 (CYANOCOBALAMIN) 500 MCG tablet Take 500 mcg by mouth daily.     ferrous fumarate (HEMOCYTE - 106 MG FE) 325 (106 Fe) MG TABS tablet Take 325 mg by mouth 2 (two) times daily.      fluorouracil CALGB 91478 in sodium  chloride 0.9 % 150 mL Inject into the vein over 96 hr. Days 1-4 and 28-31     HYDROcodone-acetaminophen (NORCO) 10-325 MG tablet Take 1 tablet by mouth at bedtime as needed. (Patient not taking: Reported on 03/13/2019) 20 tablet 0   levothyroxine (SYNTHROID) 125 MCG tablet Take 1 tablet (125 mcg total) by mouth daily. 90 tablet 1   lidocaine-prilocaine (EMLA) cream Apply a small amount to port a cath site and cover with plastic wrap one hour prior to chemotherapy appointments (Patient not taking: Reported on 03/13/2019) 30 g 3   MITOMYCIN IV Inject into the vein. Days 1, 28     prochlorperazine (COMPAZINE) 10 MG tablet Take 1 tablet (10 mg total) by mouth every 6 (six) hours as needed (Nausea or vomiting). 30 tablet 1   No facility-administered medications prior to visit.     PAST MEDICAL HISTORY: Past Medical History:  Diagnosis Date   Allergy    Anal cancer ([place]) 01/10/2019   Arthritis    Blood transfusion without reported diagnosis    2013   Depression    DVT (deep venous thrombosis) ([place]) 2013   Gait abnormality 08/03/2016   Hyperlipidemia    Hypertension    Hypothyroidism    Multiple sclerosis ([place])    Neurogenic bladder 08/03/2016   Neuromuscular disorder ([place])    dx'd with MS at age of 74   Port-A-Cath in place 01/23/2019   PTSD (post-traumatic stress disorder)    Right foot drop 08/03/2016   Thyroid disease    Ulcer     PAST SURGICAL HISTORY: Past Surgical History:  Procedure Laterality Date   COLON SURGERY     Colonoscopy   GASTRIC BYPASS  2010   HEMORRHOID SURGERY N/A 12/25/2018   Procedure: EXTENSIVE HEMORRHOIDECTOMY;  Surgeon: Aviva Signs, MD;  Location: [facility];  Service: General;  Laterality: N/A;   INTERSTIM IMPLANT PLACEMENT  2014   PORTACATH PLACEMENT      FAMILY HISTORY: Family History  Problem Relation Age of Onset   Cancer Mother        Dx'd at age 84   Breast cancer Mother    Hypertension Father    Diabetes Father     Hypertension Sister    40 / Stillbirths Sister    Heart disease Maternal Grandmother    Diabetes Maternal Grandmother    Heart disease Maternal Grandfather    Diabetes Paternal Grandmother    Heart disease Paternal Grandfather    Stroke Paternal Grandfather     SOCIAL HISTORY: Social History   Socioeconomic History   Marital status: Single    Spouse name: Ricky   Number of children: 0   Years of education: 14   Highest education level: Associate degree:  occupational, Hotel manager, or vocational program  Occupational History   Occupation: Disability  Social Designer, fashion/clothing strain: Not hard at all   Food insecurity    Worry: Never true    Inability: Never true   Transportation needs    Medical: No    Non-medical: No  Tobacco Use   Smoking status: Former Smoker    Packs/day: 1.50    Years: 15.00    Pack years: 22.50    Types: Cigarettes    Start date: 1993    Quit date: 01/30/2009    Years since quitting: 10.1   Smokeless tobacco: Never Used  Substance and Sexual Activity   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   Drug use: No   Sexual activity: Yes  Lifestyle   Physical activity    Days per week: 2 days    Minutes per session: 30 min   Stress: Not at all  Relationships   Social connections    Talks on phone: More than three times a week    Gets together: More than three times a week    Attends religious service: Never    Active member of club or organization: No    Attends meetings of clubs or organizations: Never    Relationship status: Living with partner   Intimate partner violence    Fear of current or ex partner: No    Emotionally abused: No    Physically abused: No    Forced sexual activity: No  Other Topics Concern   Not on file  Social History Narrative   Lives at home with her boyfriend   Right-handed   Caffeine: 2 glasses per day      PHYSICAL EXAM  Vitals:   03/24/19 1355    BP: 132/88  Pulse: 84  Temp: (!) 97 F (36.1 C)  Weight: 179 lb 1.6 oz (81.2 kg)  Height: 5\' 5"  (1.651 m)   Body mass index is 29.8 kg/m.  Generalized: Well developed, in no acute distress   Neurological examination  Mentation: Alert oriented to time, place, history taking. Follows all commands speech and language fluent Cranial nerve II-XII: Pupils were equal round reactive to light. Extraocular movements were full, visual field were full on confrontational test. Head turning and shoulder shrug  were normal and symmetric. Motor: The motor testing reveals 5 over 5 strength in the upper extremity.3/5 in the left lower extremity and 2/5 in the right lower extremity.  Right foot drop Sensory: Sensory testing is intact to soft touch on all 4 extremities. No evidence of extinction is noted.  Coordination: Cerebellar testing reveals good finger-nose-finger.  Unable to do heel-to-shin Gait and station: Patient stands with assistance.  She uses a Radiation protection practitioner.  She drags the right foot when ambulating.  Gait is very slow and unsteady.   DIAGNOSTIC DATA (LABS, IMAGING, TESTING) - I reviewed patient records, labs, notes, testing and imaging myself where available.  Lab Results  Component Value Date   WBC 4.5 03/13/2019   HGB 11.5 03/13/2019   HCT 35.5 03/13/2019   MCV 97 03/13/2019   PLT 218 03/13/2019      Component Value Date/Time   NA 140 03/13/2019 0922   K 3.6 03/13/2019 0922   CL 105 03/13/2019 0922   CO2 21 03/13/2019 0922   GLUCOSE 71 03/13/2019 0922   GLUCOSE 58 (L) 02/27/2019 1035   BUN 9 03/13/2019 0922   CREATININE 0.82 03/13/2019 [id number]  CALCIUM 9.4 03/13/2019 0922   PROT 4.8 (L) 03/13/2019 0922   ALBUMIN 3.5 (L) 03/13/2019 0922   AST 11 03/13/2019 0922   ALT 11 03/13/2019 0922   ALKPHOS 63 03/13/2019 0922   BILITOT 0.2 03/13/2019 0922   GFRNONAA 88 03/13/2019 0922   GFRAA 101 03/13/2019 0922   Lab Results  Component Value Date   CHOL 185 01/15/2017   HDL 83  01/15/2017   LDLCALC 74 01/15/2017   TRIG 138 01/15/2017   CHOLHDL 2.2 01/15/2017   No results found for: HGBA1C Lab Results  Component Value Date   VITAMINB12 >2000 (H) 03/13/2019   Lab Results  Component Value Date   TSH 5.030 (H) 03/13/2019      ASSESSMENT AND PLAN 43 y.o. year old female  has a past medical history of Allergy, Anal cancer ([place]) (01/10/2019), Arthritis, Blood transfusion without reported diagnosis, Depression, DVT (deep venous thrombosis) ([place]) (2013), Gait abnormality (08/03/2016), Hyperlipidemia, Hypertension, Hypothyroidism, Multiple sclerosis ([place]), Neurogenic bladder (08/03/2016), Neuromuscular disorder ([place]), Port-A-Cath in place (01/23/2019), PTSD (post-traumatic stress disorder), Right foot drop (08/03/2016), Thyroid disease, and Ulcer. here with :  1.  Multiple sclerosis 2.  Gait abnormality 3.  Right foot drop  I discussed the patient's care with Dr. Jannifer Franklin.  He advised okay to proceed with IV Solu-Medrol 500 mg for 3 days.  She should be able to get her next dose of Ocrevus in May.  The patient will be referred for physical therapy.  I have advised that if her symptoms worsen or she develops new symptoms she should let us know.  She will follow-up in 6 months or sooner if needed.   I spent 25 minutes with the patient. 50% of this time was spent reviewing plan of care   Ward Givens, MSN, NP-C 03/24/2019, 2:05 PM [facility] [address] [place], [place] [address] [phone]

## 2019-03-24 NOTE — Patient Instructions (Signed)
Your Plan:  IV solumedrol 500 mg daily for 3 days Referral for PT     Thank you for coming to see [name] at [facility]. I hope we have been able to provide you high quality care today.  You may receive a patient satisfaction survey over the next few weeks. We would appreciate your feedback and comments so that we may continue to improve ourselves and the health of our patients.

## 2019-03-28 ENCOUNTER — Encounter (HOSPITAL_COMMUNITY): Payer: Self-pay

## 2019-03-31 ENCOUNTER — Other Ambulatory Visit: Payer: Self-pay

## 2019-04-01 ENCOUNTER — Inpatient Hospital Stay (HOSPITAL_COMMUNITY): Payer: Medicare HMO | Admitting: Hematology

## 2019-04-01 ENCOUNTER — Ambulatory Visit (INDEPENDENT_AMBULATORY_CARE_PROVIDER_SITE_OTHER): Payer: Medicare HMO | Admitting: Licensed Clinical Social Worker

## 2019-04-01 ENCOUNTER — Inpatient Hospital Stay (HOSPITAL_COMMUNITY): Payer: Medicare HMO | Attending: Hematology

## 2019-04-01 ENCOUNTER — Encounter (HOSPITAL_COMMUNITY): Payer: Self-pay | Admitting: Hematology

## 2019-04-01 ENCOUNTER — Other Ambulatory Visit (HOSPITAL_COMMUNITY): Payer: Self-pay | Admitting: *Deleted

## 2019-04-01 VITALS — BP 122/53 | HR 98 | Temp 97.5°F | Resp 18 | Wt 185.2 lb

## 2019-04-01 DIAGNOSIS — Z86718 Personal history of other venous thrombosis and embolism: Secondary | ICD-10-CM | POA: Diagnosis not present

## 2019-04-01 DIAGNOSIS — C210 Malignant neoplasm of anus, unspecified: Secondary | ICD-10-CM | POA: Insufficient documentation

## 2019-04-01 DIAGNOSIS — Z8249 Family history of ischemic heart disease and other diseases of the circulatory system: Secondary | ICD-10-CM | POA: Insufficient documentation

## 2019-04-01 DIAGNOSIS — G35 Multiple sclerosis: Secondary | ICD-10-CM | POA: Insufficient documentation

## 2019-04-01 DIAGNOSIS — Z793 Long term (current) use of hormonal contraceptives: Secondary | ICD-10-CM | POA: Insufficient documentation

## 2019-04-01 DIAGNOSIS — E039 Hypothyroidism, unspecified: Secondary | ICD-10-CM | POA: Diagnosis not present

## 2019-04-01 DIAGNOSIS — R531 Weakness: Secondary | ICD-10-CM | POA: Insufficient documentation

## 2019-04-01 DIAGNOSIS — I10 Essential (primary) hypertension: Secondary | ICD-10-CM | POA: Insufficient documentation

## 2019-04-01 DIAGNOSIS — Z79899 Other long term (current) drug therapy: Secondary | ICD-10-CM | POA: Diagnosis not present

## 2019-04-01 DIAGNOSIS — R5383 Other fatigue: Secondary | ICD-10-CM | POA: Insufficient documentation

## 2019-04-01 DIAGNOSIS — E785 Hyperlipidemia, unspecified: Secondary | ICD-10-CM | POA: Diagnosis not present

## 2019-04-01 DIAGNOSIS — F331 Major depressive disorder, recurrent, moderate: Secondary | ICD-10-CM | POA: Diagnosis not present

## 2019-04-01 DIAGNOSIS — Z7951 Long term (current) use of inhaled steroids: Secondary | ICD-10-CM | POA: Insufficient documentation

## 2019-04-01 DIAGNOSIS — Z803 Family history of malignant neoplasm of breast: Secondary | ICD-10-CM | POA: Insufficient documentation

## 2019-04-01 DIAGNOSIS — F329 Major depressive disorder, single episode, unspecified: Secondary | ICD-10-CM | POA: Diagnosis not present

## 2019-04-01 DIAGNOSIS — F4310 Post-traumatic stress disorder, unspecified: Secondary | ICD-10-CM

## 2019-04-01 LAB — CBC WITH DIFFERENTIAL/PLATELET
Abs Immature Granulocytes: 0.09 10*3/uL — ABNORMAL HIGH (ref 0.00–0.07)
Basophils Absolute: 0 10*3/uL (ref 0.0–0.1)
Basophils Relative: 0 %
Eosinophils Absolute: 0.3 10*3/uL (ref 0.0–0.5)
Eosinophils Relative: 4 %
HCT: 36.5 % (ref 36.0–46.0)
Hemoglobin: 11.8 g/dL — ABNORMAL LOW (ref 12.0–15.0)
Immature Granulocytes: 1 %
Lymphocytes Relative: 7 %
Lymphs Abs: 0.5 10*3/uL — ABNORMAL LOW (ref 0.7–4.0)
MCH: 32.3 pg (ref 26.0–34.0)
MCHC: 32.3 g/dL (ref 30.0–36.0)
MCV: 100 fL (ref 80.0–100.0)
Monocytes Absolute: 0.6 10*3/uL (ref 0.1–1.0)
Monocytes Relative: 8 %
Neutro Abs: 5.7 10*3/uL (ref 1.7–7.7)
Neutrophils Relative %: 80 %
Platelets: 181 10*3/uL (ref 150–400)
RBC: 3.65 MIL/uL — ABNORMAL LOW (ref 3.87–5.11)
RDW: 16.4 % — ABNORMAL HIGH (ref 11.5–15.5)
WBC: 7.2 10*3/uL (ref 4.0–10.5)
nRBC: 0 % (ref 0.0–0.2)

## 2019-04-01 LAB — COMPREHENSIVE METABOLIC PANEL
ALT: 14 U/L (ref 0–44)
AST: 11 U/L — ABNORMAL LOW (ref 15–41)
Albumin: 3 g/dL — ABNORMAL LOW (ref 3.5–5.0)
Alkaline Phosphatase: 65 U/L (ref 38–126)
Anion gap: 6 (ref 5–15)
BUN: 8 mg/dL (ref 6–20)
CO2: 24 mmol/L (ref 22–32)
Calcium: 8.8 mg/dL — ABNORMAL LOW (ref 8.9–10.3)
Chloride: 108 mmol/L (ref 98–111)
Creatinine, Ser: 0.8 mg/dL (ref 0.44–1.00)
GFR calc Af Amer: >60 mL/min (ref >60)
GFR calc non Af Amer: >60 mL/min (ref >60)
Glucose, Bld: 79 mg/dL (ref 70–99)
Potassium: 3.5 mmol/L (ref 3.5–5.1)
Sodium: 138 mmol/L (ref 135–145)
Total Bilirubin: 0.7 mg/dL (ref 0.3–1.2)
Total Protein: 5 g/dL — ABNORMAL LOW (ref 6.5–8.1)

## 2019-04-01 NOTE — Chronic Care Management (AMB) (Signed)
Care Management Note   Amber Williams is a 43 y.o. year old female who is a primary care patient of [name], [name], MD. The CM team was consulted for assistance with chronic disease management and care coordination.   I reached out to [name] by phone today.   Amber Williams was given information about Chronic Care Management services today including:  1. CCM service includes personalized support from designated clinical staff supervised by her physician, including individualized plan of care and coordination with other care providers 2. 24/7 contact phone numbers for assistance for urgent and routine care needs. 3. Service will only be billed when office clinical staff spend 20 minutes or more in a month to coordinate care. 4. Only one practitioner may furnish and bill the service in a calendar month. 5. The patient may stop CCM services at any time (effective at the end of the month) by phone call to the office staff. 6. The patient will be responsible for cost sharing (co-pay) of up to 20% of the service fee (after annual deductible is met). Patient agreed to services and verbal consent obtained.    Review of patient status, including review of consultants reports, relevant laboratory and other test results, and collaboration with appropriate care team members and the patient's provider was performed as part of comprehensive patient evaluation and provision of chronic care management services.   Social Determinants of Health: risk for stress; risk for depression    Chronic Care Management from 04/01/2019 in [facility]  PHQ-9 Total Score  5     Medications    ALLERGY RELIEF 180 MG tablet    amitriptyline (ELAVIL) 50 MG tablet    Cholecalciferol (VITAMIN D3) 5000 units CAPS    escitalopram (LEXAPRO) 10 MG tablet    flurazepam (DALMANE) 30 MG capsule    levonorgestrel-ethinyl estradiol (NORDETTE) 0.15-30 MG-MCG tablet    levothyroxine (SYNTHROID)  125 MCG tablet    loperamide (IMODIUM) 1 MG/5ML solution    mometasone (NASONEX) 50 MCG/ACT nasal spray    Ocrelizumab (OCREVUS IV)    omeprazole (PRILOSEC) 20 MG capsule    vitamin B-12 (CYANOCOBALAMIN) 500 MCG tablet    Goals    . Client will talk with LCSW in next 3 weeks to discuss stress/anxiety management for client (pt-stated)     Current Barriers:  . Client has MS (multiple sclerosis) . Client has decreased energy . Decreased appetite  Clinical Social Work Clinical Goal(s):  [name] LCSW will talk with client in next 3 weeks about stress/anxiety management for client  Interventions: . Talked with client about pain issues of client . Talked with client about CCM program services . Encouraged client to talk with RNCM to discuss nursing needs of client . Talked with [facility] about her use of food stamps benefit . Talked with Erma about health needs of her sister . Talked with Jesslynn about health needs of her mother . Talked with client about radiation treatments of client . Talked with client about ambulation challenges of client  Patient Self Care Activities:   Attends classes, as able a, at [facility] medications as prescribed.  Plan:  Client to attend scheduled client medical appointments Client to communicate with RNCM to discuss nursing needs of client LCSW to call client in next 3 weeks to talk with her about managing anxiety and stress issues of client  Initial goal documentation       Follow Up Plan: LCSW to call client  in next 3 weeks to talk with client about her management of anxiety and stress issues faced.  [name].Janise Gora MSW, LCSW Licensed Clinical Social Worker [facility] [phone]

## 2019-04-01 NOTE — Progress Notes (Signed)
Orders placed for home health IV infusion of Solu-medrol 1 gram over 1 hour infused every 24 hours via port x 5 days per Harriet Pho, NP.   I have spoke with Carolynn Sayers, RN with [facility] and they are able to provide the services for this patient.  Referral has been placed.

## 2019-04-01 NOTE — Progress Notes (Signed)
Amber Williams, [place] [id number]   CLINIC:  Medical Oncology/Hematology  PCP:  [name], MD [place] [id number] [phone]   REASON FOR VISIT:  Follow-up for Anal squamous cell carcinoma, p16 positive  CURRENT THERAPY: 5-FU and mitomycin   BRIEF ONCOLOGIC HISTORY:  Oncology History  Anal cancer ([facility])  01/10/2019 Initial Diagnosis   Anal cancer ([facility])   01/16/2019 Cancer Staging   Staging form: Anus, AJCC 8th Edition - Clinical: Stage Unknown (cTX, cN0, cM0) - Signed by Derek Jack, MD on 01/16/2019   02/03/2019 -  Chemotherapy   The patient had palonosetron (ALOXI) injection 0.25 mg, 0.25 mg, Intravenous,  Once, 1 of 1 cycle Administration: 0.25 mg (02/03/2019), 0.25 mg (03/03/2019) mitoMYcin (MUTAMYCIN) chemo injection 20 mg, 10.1 mg/m2 = 19.5 mg, Intravenous,  Once, 1 of 1 cycle Administration: 20 mg (02/03/2019), 20 mg (03/03/2019) fluorouracil (ADRUCIL) 7,900 mg in sodium chloride 0.9 % 92 mL chemo infusion, 1,000 mg/m2/day = 7,900 mg, Intravenous, 4D (96 hours ), 1 of 1 cycle Administration: 7,900 mg (02/03/2019), 7,900 mg (03/03/2019)  for chemotherapy treatment.       CANCER STAGING: Cancer Staging Anal cancer ([facility]) Staging form: Anus, AJCC 8th Edition - Clinical: Stage Unknown (cTX, cN0, cM0) - Signed by Derek Jack, MD on 01/16/2019    INTERVAL HISTORY:  Amber Williams 43 y.o. female presents today for follow-up.  She reports overall doing fair.  She completed radiation therapy.  She states that she completed therapy she has been unable to bear weight on her legs and believes she is having an MS flare.  She has been in communication with her neurologist.  Her energy continues to improve.  Her main concern is her inability to bear weight.   REVIEW OF SYSTEMS:  Review of Systems  Constitutional: Positive for fatigue.  HENT:  Negative.   Eyes: Negative.   Respiratory: Negative.    Cardiovascular: Negative.   Gastrointestinal: Negative.   Endocrine: Negative.   Genitourinary: Negative.    Musculoskeletal: Positive for gait problem and myalgias.  Skin: Negative.   Neurological: Positive for extremity weakness and gait problem.  Hematological: Negative.   Psychiatric/Behavioral: Negative.      PAST MEDICAL/SURGICAL HISTORY:  Past Medical History:  Diagnosis Date  . Allergy   . Anal cancer ([facility]) 01/10/2019  . Arthritis   . Blood transfusion without reported diagnosis    2013  . Depression   . DVT (deep venous thrombosis) ([place]) 2013  . Gait abnormality 08/03/2016  . Hyperlipidemia   . Hypertension   . Hypothyroidism   . Multiple sclerosis ([facility])   . Neurogenic bladder 08/03/2016  . Neuromuscular disorder ([facility])    dx'd with MS at age of 77  . Port-A-Cath in place 01/23/2019  . PTSD (post-traumatic stress disorder)   . Right foot drop 08/03/2016  . Thyroid disease   . Ulcer    Past Surgical History:  Procedure Laterality Date  . COLON SURGERY     Colonoscopy  . GASTRIC BYPASS  2010  . HEMORRHOID SURGERY N/A 12/25/2018   Procedure: EXTENSIVE HEMORRHOIDECTOMY;  Surgeon: Aviva Signs, MD;  Location: [facility];  Service: General;  Laterality: N/A;  . INTERSTIM IMPLANT PLACEMENT  2014  . PORTACATH PLACEMENT       SOCIAL HISTORY:  Social History   Socioeconomic History  . Marital status: Single    Spouse name: Ricky  . Number of children: 0  . Years of education:  14  . Highest education level: Associate degree: occupational, Hotel manager, or vocational program  Occupational History  . Occupation: Disability  Social Needs  . Financial resource strain: Not hard at all  . Food insecurity    Worry: Never true    Inability: Never true  . Transportation needs    Medical: No    Non-medical: No  Tobacco Use  . Smoking status: Former Smoker    Packs/day: 1.50    Years: 15.00    Pack years: 22.50    Types: Cigarettes    Start date: 1993    Quit date:  01/30/2009    Years since quitting: 10.1  . Smokeless tobacco: Never Used  Substance and Sexual Activity  . 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 Drug use: No  . Sexual activity: Yes  Lifestyle  . Physical activity    Days per week: 2 days    Minutes per session: 30 min  . Stress: Not at all  Relationships  . Social connections    Talks on phone: More than three times a week    Gets together: More than three times a week    Attends religious service: Never    Active member of club or organization: No    Attends meetings of clubs or organizations: Never    Relationship status: Living with partner  . Intimate partner violence    Fear of current or ex partner: No    Emotionally abused: No    Physically abused: No    Forced sexual activity: No  Other Topics Concern  . Not on file  Social History Narrative   Lives at home with her boyfriend   Right-handed   Caffeine: 2 glasses per day    FAMILY HISTORY:  Family History  Problem Relation Age of Onset  . Cancer Mother        Dx'd at age 34  . Breast cancer Mother   . Hypertension Father   . Diabetes Father   . Hypertension Sister   . Miscarriages / Stillbirths Sister   . Heart disease Maternal Grandmother   . Diabetes Maternal Grandmother   . Heart disease Maternal Grandfather   . Diabetes Paternal Grandmother   . Heart disease Paternal Grandfather   . Stroke Paternal Grandfather     CURRENT MEDICATIONS:  Outpatient Encounter Medications as of 04/01/2019  Medication Sig  . ALLERGY RELIEF 180 MG tablet Take 180 mg by mouth daily as needed for allergies.   [name] amitriptyline (ELAVIL) 50 MG tablet Take 1 tablet (50 mg total) by mouth at bedtime.  . Cholecalciferol (VITAMIN D3) 5000 units CAPS Take 5,000 Units by mouth every morning.  . escitalopram (LEXAPRO) 10 MG tablet Take 1 tablet (10 mg total) by mouth daily.  . flurazepam (DALMANE) 30 MG capsule Take 1 capsule (30 mg total) by mouth at bedtime.   [name] levonorgestrel-ethinyl estradiol (NORDETTE) 0.15-30 MG-MCG tablet Take 1 tablet by mouth daily.  [name] levothyroxine (SYNTHROID) 125 MCG tablet Take 125 mcg by mouth daily before breakfast.  . mometasone (NASONEX) 50 MCG/ACT nasal spray Place 2 sprays into the nose 2 (two) times daily as needed (allergies).   [name] omeprazole (PRILOSEC) 20 MG capsule TAKE 1 CAPSULE DAILY  . vitamin B-12 (CYANOCOBALAMIN) 500 MCG tablet Take 500 mcg by mouth daily.  [name] loperamide (IMODIUM) 1 MG/5ML solution Take 6 mg by mouth as needed for diarrhea or loose stools.   Wess Botts (OCREVUS IV) Inject  into the vein every 6 (six) months.   . [DISCONTINUED] ciprofloxacin (CIPRO) 500 MG tablet Take 500 mg by mouth 2 (two) times daily.  . [DISCONTINUED] ferrous fumarate (HEMOCYTE - 106 MG FE) 325 (106 Fe) MG TABS tablet Take 325 mg by mouth 2 (two) times daily.    No facility-administered encounter medications on file as of 04/01/2019.     ALLERGIES:  Allergies  Allergen Reactions  . Other Hives    Plastic Tape  . Tecfidera [Dimethyl Fumarate] Itching     PHYSICAL EXAM:  ECOG Performance status: 3  Vitals:   04/01/19 0900  BP: (!) 122/53  Pulse: 98  Resp: 18  Temp: (!) 97.5 F (36.4 C)  SpO2: 98%   Filed Weights   04/01/19 0900  Weight: 185 lb 4 oz (84 kg)    Physical Exam Constitutional:      Appearance: Normal appearance.  HENT:     Head: Normocephalic.     Right Ear: External ear normal.     Left Ear: External ear normal.  Neck:     Musculoskeletal: Normal range of motion.  Cardiovascular:     Rate and Rhythm: Normal rate and regular rhythm.     Pulses: Normal pulses.     Heart sounds: Normal heart sounds.  Pulmonary:     Effort: Pulmonary effort is normal.     Breath sounds: Normal breath sounds.  Musculoskeletal:     Comments: Decreased range of motion and strength  Neurological:     Mental Status: She is alert.     Motor: Weakness present.     Coordination: Coordination abnormal.      Gait: Gait abnormal.  Psychiatric:        Mood and Affect: Mood normal.        Thought Content: Thought content normal.      LABORATORY DATA:  I have reviewed the labs as listed.  CBC    Component Value Date/Time   WBC 7.2 04/01/2019 0827   RBC 3.65 (L) 04/01/2019 0827   HGB 11.8 (L) 04/01/2019 0827   HGB 11.5 03/13/2019 0922   HCT 36.5 04/01/2019 0827   HCT 35.5 03/13/2019 0922   PLT 181 04/01/2019 0827   PLT 218 03/13/2019 0922   MCV 100.0 04/01/2019 0827   MCV 97 03/13/2019 0922   MCH 32.3 04/01/2019 0827   MCHC 32.3 04/01/2019 0827   RDW 16.4 (H) 04/01/2019 0827   RDW 16.5 (H) 03/13/2019 0922   LYMPHSABS 0.5 (L) 04/01/2019 0827   LYMPHSABS 0.1 (L) 03/13/2019 0922   MONOABS 0.6 04/01/2019 0827   EOSABS 0.3 04/01/2019 0827   EOSABS 0.4 03/13/2019 0922   BASOSABS 0.0 04/01/2019 0827   BASOSABS 0.0 03/13/2019 0922   CMP Latest Ref Rng & Units 04/01/2019 03/13/2019 02/27/2019  Glucose 70 - 99 mg/dL 79 71 58(L)  BUN 6 - 20 mg/dL 8 9 6   Creatinine 0.44 - 1.00 mg/dL 0.80 0.82 0.75  Sodium 135 - 145 mmol/L 138 140 142  Potassium 3.5 - 5.1 mmol/L 3.5 3.6 3.5  Chloride 98 - 111 mmol/L 108 105 108  CO2 22 - 32 mmol/L 24 21 27   Calcium 8.9 - 10.3 mg/dL 8.8(L) 9.4 9.3  Total Protein 6.5 - 8.1 g/dL 5.0(L) 4.8(L) 5.5(L)  Total Bilirubin 0.3 - 1.2 mg/dL 0.7 0.2 0.3  Alkaline Phos 38 - 126 U/L 65 63 57  AST 15 - 41 U/L 11(L) 11 15  ALT 0 - 44 U/L 14 11 14  ASSESSMENT & PLAN:   Anal cancer ([facility]) 1.  Anal squamous cell carcinoma, p16 positive: -Biopsy on 12/25/2018 consistent with invasive moderately differentiated keratinizing squamous cell carcinoma. -Colonoscopy 5 years ago in [facility], reportedly normal. -PET scan on 01/15/2019 did not show any evidence of metastatic disease. -XRT and 5-FU and mitomycin started on 02/03/2019. -She tolerated first cycle very well.  Did not experience any GI side effects. -03/03/2019 recieved 5-FU and mitomycin day 29.  -Labs are acceptable.  No GI toxicity.  -Plan to repeat PET/CT in 8 weeks.  2.  Multiple sclerosis: -Several years duration, resulting in right-sided weakness, fatigue, memory issues and headaches.  She walks with cane/walker. -Treated with multiple agents including Tecfidera, Betaseron, Avonex, Copaxone.  Also received mitoxantrone in the past. -She is on Ocrevus every 6 months. -Multiple sclerosis flare this is most likely secondary to chemo and radiation therapy.  I recommend patient proceed with Solu-Medrol 1 g infusion every 24 hours x5 days.  If symptoms do not improve recommend patient follow-up with neurology.  3.  Family history: -Mother had breast cancer 3 years ago.  Sister was just diagnosed with breast cancer.      Orders placed this encounter:  Orders Placed This Encounter  Procedures  . NM PET Image Restag (PS) Skull Base To Thigh      [place] [phone]

## 2019-04-01 NOTE — Patient Instructions (Signed)
Licensed Clinical Social Worker Visit Information  Goals we discussed today:  Goals Addressed            This Visit's Progress   . Client will talk with LCSW in next 3 weeks to discuss stress/anxiety management for client (pt-stated)       Current Barriers:  . Client has MS (multiple sclerosis) . Client has decreased energy . Decreased appetite  Clinical Social Work Clinical Goal(s):  [name] LCSW will talk with client in next 3 weeks about stress/anxiety management for client  Interventions: . Talked with client about pain issues of client . Talked with client about CCM program services . Encouraged client to talk with RNCM to discuss nursing needs of client . Talked with [facility] about her use of food stamps benefit . Talked with Alecia about health needs of her sister . Talked with Tya about health needs of her mother . Talked with client about radiation treatments of client .   Patient Self Care Activities:   Attends classes, as able, at [facility] medications as prescribed.   Plan:  Client to attend scheduled client medical appointments Client to communicate with RNCM to discuss nursing needs of client LCSW to call client in next 3 weeks to talk with her about managing anxiety and stress issues of client  Initial goal documentation       Materials provided: No  Ms. Shader was given information about Chronic Care Management services today including:  1. CCM service includes personalized support from designated clinical staff supervised by her physician, including individualized plan of care and coordination with other care providers 2. 24/7 contact phone numbers for assistance for urgent and routine care needs. 3. Service will only be billed when office clinical staff spend 20 minutes or more in a month to coordinate care. 4. Only one practitioner may furnish and bill the service in a calendar month. 5. The patient may stop CCM services  at any time (effective at the end of the month) by phone call to the office staff. 6. The patient will be responsible for cost sharing (co-pay) of up to 20% of the service fee (after annual deductible is met).  Patient agreed to services and verbal consent obtained.   Follow up plan:  LCSW to call client in next 3 weeks to talk with her about management of anxiety and stress issues faced by client  The patient verbalized understanding of instructions provided today and declined a print copy of patient instruction materials.   [name].Shavana Calder MSW, LCSW Licensed Clinical Social Worker [facility] Care Management [phone]

## 2019-04-01 NOTE — Assessment & Plan Note (Signed)
1.  Anal squamous cell carcinoma, p16 positive: -Biopsy on 12/25/2018 consistent with invasive moderately differentiated keratinizing squamous cell carcinoma. -Colonoscopy 5 years ago in [facility], reportedly normal. -PET scan on 01/15/2019 did not show any evidence of metastatic disease. -XRT and 5-FU and mitomycin started on 02/03/2019. -She tolerated first cycle very well.  Did not experience any GI side effects. -03/03/2019 recieved 5-FU and mitomycin day 29. -Labs are acceptable.  No GI toxicity.  -Plan to repeat PET/CT in 8 weeks.  2.  Multiple sclerosis: -Several years duration, resulting in right-sided weakness, fatigue, memory issues and headaches.  She walks with cane/walker. -Treated with multiple agents including Tecfidera, Betaseron, Avonex, Copaxone.  Also received mitoxantrone in the past. -She is on Ocrevus every 6 months. -Multiple sclerosis flare this is most likely secondary to chemo and radiation therapy.  I recommend patient proceed with Solu-Medrol 1 g infusion every 24 hours x5 days.  If symptoms do not improve recommend patient follow-up with neurology.  3.  Family history: -Mother had breast cancer 3 years ago.  Sister was just diagnosed with breast cancer.

## 2019-04-03 ENCOUNTER — Other Ambulatory Visit (HOSPITAL_COMMUNITY): Payer: Medicare HMO

## 2019-04-03 ENCOUNTER — Ambulatory Visit (HOSPITAL_COMMUNITY): Payer: Medicare HMO | Admitting: Hematology

## 2019-04-05 ENCOUNTER — Other Ambulatory Visit: Payer: Self-pay | Admitting: Family Medicine

## 2019-04-07 ENCOUNTER — Telehealth (HOSPITAL_COMMUNITY): Payer: Self-pay | Admitting: *Deleted

## 2019-04-07 ENCOUNTER — Other Ambulatory Visit: Payer: Self-pay | Admitting: Family Medicine

## 2019-04-07 MED ORDER — LEVOTHYROXINE SODIUM 125 MCG PO TABS: 125.0000 ug | ORAL_TABLET | Freq: Every day | ORAL | 0 refills | Status: DC

## 2019-04-07 NOTE — Telephone Encounter (Signed)
Verbal order given for pt to start PT with [facility] for four weeks.

## 2019-04-09 ENCOUNTER — Telehealth (HOSPITAL_COMMUNITY): Payer: Self-pay | Admitting: *Deleted

## 2019-04-09 NOTE — Telephone Encounter (Signed)
Amber Williams, PT from [facility] called into clinic to let us know pt had a fall on Saturday with minor bruising on pt's legs.PT stated pt has multiple falls weekly. PT is working on fall prevention for pt. Verbal order was given to [facility] for SW to evaluate pt due to pt's financial difficulties.

## 2019-04-16 ENCOUNTER — Other Ambulatory Visit: Payer: Self-pay | Admitting: Family Medicine

## 2019-04-22 ENCOUNTER — Ambulatory Visit: Payer: Self-pay | Admitting: Licensed Clinical Social Worker

## 2019-04-22 ENCOUNTER — Ambulatory Visit: Payer: Medicare HMO | Admitting: *Deleted

## 2019-04-22 DIAGNOSIS — F4310 Post-traumatic stress disorder, unspecified: Secondary | ICD-10-CM

## 2019-04-22 DIAGNOSIS — E039 Hypothyroidism, unspecified: Secondary | ICD-10-CM

## 2019-04-22 DIAGNOSIS — M21371 Foot drop, right foot: Secondary | ICD-10-CM

## 2019-04-22 DIAGNOSIS — G35 Multiple sclerosis: Secondary | ICD-10-CM

## 2019-04-22 DIAGNOSIS — F324 Major depressive disorder, single episode, in partial remission: Secondary | ICD-10-CM

## 2019-04-22 NOTE — Chronic Care Management (AMB) (Signed)
  Chronic Care Management   Care Coordination Note   04/22/2019 Name: Amber Williams MRN: [id number] DOB: 1975/05/11  Referred by: [name], MD Reason for referral : Chronic Care Management (Care Coordination)   Amber Williams is a 43 y.o. year old female who is a primary care patient of [name], [name], MD. The CCM team was consulted for assistance with chronic disease management and care coordination needs.    Review of patient status, including review of consultants reports, relevant laboratory and other test results, and collaboration with appropriate care team members and the patient's provider was performed as part of comprehensive patient evaluation and provision of chronic care management services.      RN Care Plan           This Visit's Progress   . Fall Prevention       Increase in falls in patient with multiple sclerosis  Current Barriers:  . Chronic Disease Management support and education needs related to multiple sclerosis . Knowledge deficit related to reason for increase in falls recently . Knowledge deficit related to fall prevention strategies  Nurse Case Manager Clinical Goal(s):  [name] Over the next 3 days, patient will talk with RN Care Manager regarding increase in falls . Over the next 30 days, patient will follow-up with neurologist to address MS and increase in falls  Interventions:  . Consulted by LCSW regarding patient's statement that she has had an increase in falls recently. Reports that she finished physical therapy recently. Unsure if she had occupational therapy. Reports that she has difficulty getting on and off of the toilet and has fallen several times. Reports that she does not have use of her right leg. She has an appointment with her neurologist scheduled for the end of January.  . Scheduled patient for telephone visit with RN CCM for tomorrow  Patient Self Care Activities:  . Performs ADL's independently . Performs IADL's  independently . Unable to independently drive  Initial goal documentation        Follow-up Plan The care management team will reach out to the patient again over the next 1 days.   [name], BSN, RN-BC Embedded Chronic Care Manager [facility] Management Direct Dial: [phone]

## 2019-04-22 NOTE — Chronic Care Management (AMB) (Signed)
  Care Management Note   Amber Williams is a 43 y.o. year old female who is a primary care patient of [name], [name], MD. The CM team was consulted for assistance with chronic disease management and care coordination.   I reached out to Amber Williams by phone today.  Review of patient status, including review of consultants reports, relevant laboratory and other test results, and collaboration with appropriate care team members and the patient's provider was performed as part of comprehensive patient evaluation and provision of chronic care management services.   Social determinants of health; risk of tobacco use; risk of depression    Chronic Care Management from 04/01/2019 in [facility]  PHQ-9 Total Score  5     GAD 7 : Generalized Anxiety Score 04/01/2019  Nervous, Anxious, on Edge 1  Control/stop worrying 0  Worry too much - different things 0  Trouble relaxing 1  Restless 0  Easily annoyed or irritable 1  Afraid - awful might happen 0  Total GAD 7 Score 3  Anxiety Difficulty Somewhat difficult   Medications   (very important)  New medications from outside sources are available for reconciliation  ALLERGY RELIEF 180 MG tablet amitriptyline (ELAVIL) 50 MG tablet Cholecalciferol (VITAMIN D3) 5000 units CAPS escitalopram (LEXAPRO) 10 MG tablet flurazepam (DALMANE) 30 MG capsule levonorgestrel-ethinyl estradiol (NORDETTE) 0.15-30 MG-MCG tablet levothyroxine (SYNTHROID) 125 MCG tablet loperamide (IMODIUM) 1 MG/5ML solution mometasone (NASONEX) 50 MCG/ACT nasal spray Ocrelizumab (OCREVUS IV) omeprazole (PRILOSEC) 20 MG capsule vitamin B-12 (CYANOCOBALAMIN) 500 MCG tablet  Goals Addressed            This Visit's Progress   . Client will talk with LCSW in next 3 weeks to discuss stress/anxiety management for client (pt-stated)       Current Barriers:  . Client has MS (multiple sclerosis) and has Chronic Diagnoses also of Depression, PTSD,  and Hypothyroidism . Client has decreased energy . Decreased appetite  Clinical Social Work Clinical Goal(s):  [name] LCSW will talk with client in next 3 weeks about stress/anxiety management for client  Interventions: . Talked with client about pain issues of client . Encouraged client to talk with RNCM to discuss nursing needs of client . Talked with [name] about her use of food stamps benefit . Talked with [name] about health needs of her sister . Talked with client about radiation treatments of client . Talked with client about recent falls of client . Provided counseling support for client . Talked with client about her recent completion of Physical Therapy sessions (she said she completed at least 4 sessions) . Talked with client about her upcoming medical appointments . Talked with client about her completion of ADLs . Collaborated with RNCM about nursing needs of client  Patient Self Care Activities:   Attends classes, as able a, at [name] medications as prescribed.   Plan:  Client to attend scheduled client medical appointments Client to communicate with RNCM to discuss nursing needs of client LCSW to call client in next 4 weeks to talk with her about managing anxiety and stress issues of client  Initial goal documentation       Follow Up Plan: LCSW to call client in next 4 weeks to talk with client about her management of anxiety and stress issues faced by client  Amber Williams MSW, LCSW Licensed Clinical Social Worker [facility] Care Management [phone]

## 2019-04-22 NOTE — Patient Instructions (Signed)
Visit Information  Goals Addressed            This Visit's Progress   . Fall Prevention       Increase in falls in patient with multiple sclerosis  Current Barriers:  . Chronic Disease Management support and education needs related to multiple sclerosis . Knowledge deficit related to reason for increase in falls recently . Knowledge deficit related to fall prevention strategies  Nurse Case Manager Clinical Goal(s):  [name] Over the next 3 days, patient will talk with RN Care Manager regarding increase in falls . Over the next 30 days, patient will follow-up with neurologist to address MS and increase in falls  Interventions:  . Consulted by LCSW regarding patient's statement that she has had an increase in falls recently. Reports that she finished physical therapy recently. Unsure if she had occupational therapy. Reports that she has difficulty getting on and off of the toilet and has fallen several times. Reports that she does not have use of her right leg. She has an appointment with her neurologist scheduled for the end of January.  . Scheduled patient for telephone visit with RN CCM for tomorrow  Patient Self Care Activities:  . Performs ADL's independently . Performs IADL's independently . Unable to independently drive  Initial goal documentation       The care management team will reach out to the patient again over the next 1 days.   [name], BSN, RN-BC Embedded Chronic Care Manager [facility] [place] [facility] Direct Dial: [phone]

## 2019-04-22 NOTE — Patient Instructions (Addendum)
Licensed Clinical Social Worker Visit Information  Goals we discussed today:  Goals Addressed            This Visit's Progress   . Client will talk with LCSW in next 3 weeks to discuss stress/anxiety management for client (pt-stated)       Current Barriers:  . Client has MS (multiple sclerosis) and has Chronic Diagnoses also of Depression, PTSD, and Hypothyroidism . Client has decreased energy . Decreased appetite  Clinical Social Work Clinical Goal(s):  [name] LCSW will talk with client in next 3 weeks about stress/anxiety management for client  Interventions:  Talked with client about pain issues of client  Encouraged client to talk with RNCM to discuss nursing needs of client  Talked with [facility] about her use of food stamps benefit  Talked with Laketha about health needs of her sister  Talked with client about radiation treatments of client  Talked with client about recent falls of client  Provided counseling support for client  Talked with client about her recent completion of Physical Therapy sessions (she said she completed at least 4 sessions)  Talked with client about her upcoming medical appointments  Talked with client about her completion of ADLs  Collaborated with RNCM about nursing needs of client  Patient Self Care Activities:   Attends classes, as able a, at [place] medications as prescribed.   Plan:  Client to attend scheduled client medical appointments Client to communicate with RNCM to discuss nursing needs of client LCSW to call client in next 4 weeks to talk with her about managing anxiety and stress issues of client  Initial goal documentation        Materials Provided: No  Follow Up Plan: LCSW to call client in next 4 weeks to talk with her about her managing anxiety and stress issues of client  The patient verbalized understanding of instructions provided today and declined a print copy of patient instruction  materials.   [name].Roderic Lammert MSW, LCSW Licensed Clinical Social Worker [facility] [phone]

## 2019-04-23 ENCOUNTER — Ambulatory Visit: Payer: Medicare HMO | Admitting: *Deleted

## 2019-04-23 DIAGNOSIS — M21371 Foot drop, right foot: Secondary | ICD-10-CM

## 2019-04-23 DIAGNOSIS — R269 Unspecified abnormalities of gait and mobility: Secondary | ICD-10-CM

## 2019-04-23 DIAGNOSIS — G35 Multiple sclerosis: Secondary | ICD-10-CM

## 2019-04-23 NOTE — Chronic Care Management (AMB) (Signed)
  Chronic Care Management   RN Outreach Note   04/23/2019 Name: Amber Williams MRN: [id number] DOB: 12-09-1975  Referred by: [name], MD Reason for referral : Chronic Care Management (Initial RN Outreach)   [name] is a 43 y.o. year old female who is a primary care patient of [name], [name], MD. The CCM team was consulted for assistance with chronic disease management and care coordination needs.    Review of patient status, including review of consultants reports, relevant laboratory and other test results, and collaboration with appropriate care team members and the patient's provider was performed as part of comprehensive patient evaluation and provision of chronic care management services.      Nurse Care Plan           This Visit's Progress   . Fall Prevention       Increase in falls in patient with multiple sclerosis  Current Barriers:  . Chronic Disease Management support and education needs related to multiple sclerosis . Knowledge deficit related to reason for increase in falls recently . Knowledge deficit related to fall prevention strategies  Nurse Case Manager Clinical Goal(s):  [name] Over the next 3 days, patient will talk with RN Care Manager regarding increase in falls . Over the next 30 days, patient will follow-up with neurologist to address MS and increase in falls  Interventions:  . Reached out to patient by telephone but was unable to speak with her  . Left message on personal voicemail to return my call and encouraged her to reach out to her neurologist . If no return call, I will reach out again next week . Updated LCSW  Patient Self Care Activities:  . Performs ADL's independently . Performs IADL's independently . Unable to independently drive  Please see past updates related to this goal by clicking on the "Past Updates" button in the selected goal         Follow-up Plan The care management team will reach out to the patient  again over the next 7 days.   [name], BSN, RN-BC Embedded Chronic Care Manager [facility] Direct Dial: [phone]

## 2019-04-23 NOTE — Patient Instructions (Signed)
  Follow-up Plan The care management team will reach out to the patient again over the next 7 days.   [name], BSN, RN-BC Embedded Chronic Care Manager [facility] Direct Dial: [phone]

## 2019-05-07 ENCOUNTER — Ambulatory Visit: Payer: Medicare HMO | Admitting: Family Medicine

## 2019-05-13 ENCOUNTER — Other Ambulatory Visit: Payer: Medicare HMO

## 2019-05-16 ENCOUNTER — Other Ambulatory Visit: Payer: Self-pay | Admitting: Family Medicine

## 2019-05-19 ENCOUNTER — Other Ambulatory Visit: Payer: Self-pay

## 2019-05-20 ENCOUNTER — Encounter: Payer: Self-pay | Admitting: Family Medicine

## 2019-05-20 ENCOUNTER — Ambulatory Visit (INDEPENDENT_AMBULATORY_CARE_PROVIDER_SITE_OTHER): Payer: Medicare HMO | Admitting: Family Medicine

## 2019-05-20 VITALS — BP 128/66 | HR 102 | Temp 98.9°F | Ht 65.0 in | Wt 181.6 lb

## 2019-05-20 DIAGNOSIS — G4701 Insomnia due to medical condition: Secondary | ICD-10-CM

## 2019-05-20 DIAGNOSIS — G35 Multiple sclerosis: Secondary | ICD-10-CM | POA: Diagnosis not present

## 2019-05-20 DIAGNOSIS — E039 Hypothyroidism, unspecified: Secondary | ICD-10-CM

## 2019-05-20 MED ORDER — TRAZODONE HCL 150 MG PO TABS: ORAL_TABLET | ORAL | 5 refills | Status: DC

## 2019-05-20 NOTE — Progress Notes (Signed)
Subjective:  Patient ID: Amber Williams, female    DOB: 1975/06/22  Age: 44 y.o. MRN: [id number]  CC: Follow-up (labwork), Insomnia, and Referral (to neurologist)   HPI Amber Williams presents for  follow-up on  thyroid. The patient has a history of hypothyroidism for many years. It has been stable recently. Pt. denies any change in  voice, loss of hair, or heat  intolerance. She is cold a lot. Energy level has been adequate based on her baseline of MS.  Patient denies constipation and diarrhea. No myxedema. Medication is as noted below. Verified that pt is taking it daily on an empty stomach. Well tolerated.  Patient also has been undergoing treatment for annual carcinoma.  Her treatments were finished a couple months ago.  She will have her follow-up PET scan in the next few weeks.  Her hair has started to grow back.  Over the last couple of months she has noticed increasing weakness in her legs.  In the past this has responded well to intravenous steroids injected into her port.  Her current neurologist is partially retired.  The substitute for him was not willing to do the treatment as it usually works for her.  As a result she would like to have a referral to a new neurologist.  The weakness in the legs is symmetric and nonfocal.  She reports that she is not sleeping well particularly for the last week.  She continues to use the Dalmane nightly.  Depression screen [facility] 2/9 05/20/2019 04/01/2019 03/13/2019  Decreased Interest 0 1 0  Down, Depressed, Hopeless 0 1 1  PHQ - 2 Score 0 2 1  Altered sleeping 1 0 -  Tired, decreased energy 0 2 -  Change in appetite 0 0 -  Feeling bad or failure about yourself  0 0 -  Trouble concentrating 0 0 -  Moving slowly or fidgety/restless 0 1 -  Suicidal thoughts 0 0 -  PHQ-9 Score 1 5 -  Difficult doing work/chores - Somewhat difficult -  Some recent data might be hidden    History Amber Williams has a past medical history of Allergy, Anal cancer ([place])  (01/10/2019), Arthritis, Blood transfusion without reported diagnosis, Depression, DVT (deep venous thrombosis) ([place]) (2013), Gait abnormality (08/03/2016), Hyperlipidemia, Hypertension, Hypothyroidism, Multiple sclerosis ([place]), Neurogenic bladder (08/03/2016), Neuromuscular disorder ([place]), Port-A-Cath in place (01/23/2019), PTSD (post-traumatic stress disorder), Right foot drop (08/03/2016), Thyroid disease, and Ulcer.   She has a past surgical history that includes Colon surgery; Gastric bypass (2010); Interstim Implant placement (2014); Portacath placement; and Hemorrhoid surgery (N/A, 12/25/2018).   Her family history includes Breast cancer in her mother; Cancer in her mother; Diabetes in her father, maternal grandmother, and paternal grandmother; Heart disease in her maternal grandfather, maternal grandmother, and paternal grandfather; Hypertension in her father and sister; Miscarriages / [name] in her sister; Stroke in her paternal grandfather.She reports that she quit smoking about 10 years ago. Her smoking use included cigarettes. She started smoking about 28 years ago. She has a 22.50 pack-year smoking history. She has never used smokeless tobacco. She reports previous alcohol use. She reports that she does not use drugs.    ROS Review of Systems  Constitutional: Negative.   HENT: Negative.   Eyes: Negative for visual disturbance.  Respiratory: Negative for shortness of breath.   Cardiovascular: Negative for chest pain.  Gastrointestinal: Negative for abdominal pain.  Musculoskeletal: Positive for arthralgias and myalgias.  Neurological: Positive for weakness.  Psychiatric/Behavioral: Positive for sleep  disturbance.    Objective:  BP 128/66   Pulse (!) 102   Temp 98.9 F (37.2 C) (Temporal)   Ht 5' 5"  (1.651 m)   Wt 181 lb 9.6 oz (82.4 kg)   BMI 30.22 kg/m   BP Readings from Last 3 Encounters:  05/20/19 128/66  04/01/19 (!) 122/53  03/24/19 132/88    Wt Readings from Last 3  Encounters:  05/20/19 181 lb 9.6 oz (82.4 kg)  04/01/19 185 lb 4 oz (84 kg)  03/24/19 179 lb 1.6 oz (81.2 kg)     Physical Exam Constitutional:      General: She is not in acute distress.    Appearance: She is well-developed.  HENT:     Head: Normocephalic and atraumatic.     Right Ear: External ear normal.     Left Ear: External ear normal.     Nose: Nose normal.  Eyes:     Conjunctiva/sclera: Conjunctivae normal.     Pupils: Pupils are equal, round, and reactive to light.  Neck:     Thyroid: No thyromegaly.  Cardiovascular:     Rate and Rhythm: Normal rate and regular rhythm.     Heart sounds: Normal heart sounds. No murmur.  Pulmonary:     Effort: Pulmonary effort is normal. No respiratory distress.     Breath sounds: Normal breath sounds. No wheezing or rales.  Abdominal:     General: Bowel sounds are normal. There is no distension.     Palpations: Abdomen is soft.     Tenderness: There is no abdominal tenderness.  Musculoskeletal:     Cervical back: Normal range of motion and neck supple.     Comments: Patient is using a walker for ambulation.  Gait is slow and somewhat broad-based.  Lymphadenopathy:     Cervical: No cervical adenopathy.  Skin:    General: Skin is warm and dry.  Neurological:     Mental Status: She is alert and oriented to person, place, and time.     Deep Tendon Reflexes: Reflexes are normal and symmetric.  Psychiatric:        Behavior: Behavior normal.        Thought Content: Thought content normal.        Judgment: Judgment normal.       Assessment & Plan:   Amber Williams was seen today for follow-up, insomnia and referral.  Diagnoses and all orders for this visit:  Multiple sclerosis ([place]) -     Ambulatory referral to Neurology  Hypothyroidism, unspecified type -     CBC with Differential/Platelet -     CMP14+EGFR -     TSH + free T4  Insomnia due to medical condition -     TSH + free T4 -     traZODone (DESYREL) 150 MG tablet; Use  from 1/3 to 1 tablet nightly as needed for sleep.       I am having Amber Williams start on traZODone. [name] Vitamin D3, Ocrelizumab (OCREVUS IV), vitamin B-12, Allergy Relief, mometasone, levonorgestrel-ethinyl estradiol, amitriptyline, loperamide, escitalopram, omeprazole, levothyroxine, and flurazepam.  Allergies as of 05/20/2019      Reactions   Other Hives   Plastic Tape   Tecfidera [dimethyl Fumarate] Itching      Medication List       Accurate as of May 20, 2019  8:56 PM. If you have any questions, ask your nurse or doctor.  Allergy Relief 180 MG tablet Generic drug: fexofenadine Take 180 mg by mouth daily as needed for allergies.   amitriptyline 50 MG tablet Commonly known as: ELAVIL Take 1 tablet (50 mg total) by mouth at bedtime.   escitalopram 10 MG tablet Commonly known as: LEXAPRO Take 1 tablet (10 mg total) by mouth daily.   flurazepam 30 MG capsule Commonly known as: DALMANE Take 1 capsule by mouth at bedtime   levonorgestrel-ethinyl estradiol 0.15-30 MG-MCG tablet Commonly known as: NORDETTE Take 1 tablet by mouth daily.   levothyroxine 125 MCG tablet Commonly known as: SYNTHROID Take 1 tablet (125 mcg total) by mouth daily before breakfast.   loperamide 1 MG/5ML solution Commonly known as: IMODIUM Take 6 mg by mouth as needed for diarrhea or loose stools.   mometasone 50 MCG/ACT nasal spray Commonly known as: NASONEX Place 2 sprays into the nose 2 (two) times daily as needed (allergies).   OCREVUS IV Inject into the vein every 6 (six) months.   omeprazole 20 MG capsule Commonly known as: PRILOSEC TAKE 1 CAPSULE DAILY   traZODone 150 MG tablet Commonly known as: DESYREL Use from 1/3 to 1 tablet nightly as needed for sleep. Started by: [name], MD   vitamin B-12 500 MCG tablet Commonly known as: CYANOCOBALAMIN Take 500 mcg by mouth daily.   Vitamin D3 125 MCG (5000 UT) Caps Take 5,000  Units by mouth every morning.      Patient has been recently under treatment for anal carcinoma.  She seems to be recuperating well from that.  It is difficult to determine to what degree her weakness is related to this as opposed to her multiple sclerosis.  She does say that she has had episodes like this in the past.  They started when she was around age 53.  She has had 5-day courses of intravenous steroids that have resolved this on many occasions in the past.  Is unclear whether she has had those since starting on Ocrevus.  However, a second opinion from new neurologist seems appropriate to determine if steroids are indicated at this time.  Additionally she is having more trouble sleeping she has had very difficult to treat insomnia over time.  She has been tried on trazodone before but that was stopped in favor of the Dalmane.  At this point since she is on a fairly maximal dose of Dalmane I will add some trazodone for to use as needed she says she has a supply at home already.  She was reminded that it can be broken into thirds or half sore the whole pill taken.  She can titrate to effect with regard to sleep while avoiding grogginess the next day.  Follow-up: Return in about 3 months (around 08/18/2019).  [name], M.D.

## 2019-05-21 ENCOUNTER — Encounter: Payer: Self-pay | Admitting: Neurology

## 2019-05-21 ENCOUNTER — Ambulatory Visit (INDEPENDENT_AMBULATORY_CARE_PROVIDER_SITE_OTHER): Payer: Medicare HMO | Admitting: Licensed Clinical Social Worker

## 2019-05-21 DIAGNOSIS — F324 Major depressive disorder, single episode, in partial remission: Secondary | ICD-10-CM

## 2019-05-21 DIAGNOSIS — F4310 Post-traumatic stress disorder, unspecified: Secondary | ICD-10-CM

## 2019-05-21 DIAGNOSIS — E039 Hypothyroidism, unspecified: Secondary | ICD-10-CM

## 2019-05-21 LAB — CBC WITH DIFFERENTIAL/PLATELET
Basophils Absolute: 0 10*3/uL (ref 0.0–0.2)
Basos: 0 %
EOS (ABSOLUTE): 0.2 10*3/uL (ref 0.0–0.4)
Eos: 3 %
Hematocrit: 36.4 % (ref 34.0–46.6)
Hemoglobin: 11.8 g/dL (ref 11.1–15.9)
Immature Grans (Abs): 0 10*3/uL (ref 0.0–0.1)
Immature Granulocytes: 1 %
Lymphocytes Absolute: 0.5 10*3/uL — ABNORMAL LOW (ref 0.7–3.1)
Lymphs: 8 %
MCH: 33.6 pg — ABNORMAL HIGH (ref 26.6–33.0)
MCHC: 32.4 g/dL (ref 31.5–35.7)
MCV: 104 fL — ABNORMAL HIGH (ref 79–97)
Monocytes Absolute: 0.6 10*3/uL (ref 0.1–0.9)
Monocytes: 9 %
Neutrophils Absolute: 5 10*3/uL (ref 1.4–7.0)
Neutrophils: 79 %
Platelets: 314 10*3/uL (ref 150–450)
RBC: 3.51 x10E6/uL — ABNORMAL LOW (ref 3.77–5.28)
RDW: 13 % (ref 11.7–15.4)
WBC: 6.3 10*3/uL (ref 3.4–10.8)

## 2019-05-21 LAB — TSH+FREE T4
Free T4: 1.42 ng/dL (ref 0.82–1.77)
TSH: 0.189 u[IU]/mL — ABNORMAL LOW (ref 0.450–4.500)

## 2019-05-21 LAB — CMP14+EGFR
ALT: 8 IU/L (ref 0–32)
AST: 11 IU/L (ref 0–40)
Albumin/Globulin Ratio: 2.6 — ABNORMAL HIGH (ref 1.2–2.2)
Albumin: 3.4 g/dL — ABNORMAL LOW (ref 3.8–4.8)
Alkaline Phosphatase: 69 IU/L (ref 39–117)
BUN/Creatinine Ratio: 12 (ref 9–23)
BUN: 10 mg/dL (ref 6–24)
Bilirubin Total: <0.2 mg/dL (ref 0.0–1.2)
CO2: 23 mmol/L (ref 20–29)
Calcium: 9.4 mg/dL (ref 8.7–10.2)
Chloride: 108 mmol/L — ABNORMAL HIGH (ref 96–106)
Creatinine, Ser: 0.82 mg/dL (ref 0.57–1.00)
GFR calc Af Amer: 101 mL/min/{1.73_m2} (ref >59)
GFR calc non Af Amer: 88 mL/min/{1.73_m2} (ref >59)
Globulin, Total: 1.3 g/dL — ABNORMAL LOW (ref 1.5–4.5)
Glucose: 84 mg/dL (ref 65–99)
Potassium: 4.7 mmol/L (ref 3.5–5.2)
Sodium: 143 mmol/L (ref 134–144)
Total Protein: 4.7 g/dL — ABNORMAL LOW (ref 6.0–8.5)

## 2019-05-21 NOTE — Patient Instructions (Addendum)
Licensed Clinical Social Worker Visit Information  Goals we discussed today:  Goals    . Client will talk with LCSW in next 3 weeks to discuss stress/anxiety management for client (pt-stated)     Current Barriers:  . Client has MS (multiple sclerosis) and has Chronic Diagnoses also of Depression, PTSD, and Hypothyroidism . Client has decreased energy . Decreased appetite  Clinical Social Work Clinical Goal(s):  [name] LCSW will talk with client in next 4 weeks about stress/anxiety management for client  Interventions: Talked previously with client about pain issues of client  Talked with client previously about CCM program services  Previously encouraged client to talk with RNCM to discuss nursing needs of client  Previously talked with [facility] about her use of food stamps benefit  Talked previously with Toshua about health needs of her sister  Talked previously with Tamaiya about health needs of her mother  Previously talked with client about radiation treatments of client  Patient Self Care Activities:   Attends classes, as able a, at [place] medications as prescribed.   Plan:  Client to attend scheduled client medical appointments Client to communicate with RNCM to discuss nursing needs of client LCSW to call client in next 4 weeks to talk with her about managing anxiety and stress issues of client  Initial goal documentation        Materials Provided: No  Follow Up Plan:  LCSW to call client in next 4 weeks to talk with her about managing anxiety and stress issue of client  The patient verbalized understanding of instructions provided today and declined a print copy of patient instruction materials.   [name].Yerik Zeringue MSW, LCSW Licensed Clinical Social Worker [facility] Care Management [phone]

## 2019-05-21 NOTE — Chronic Care Management (AMB) (Signed)
  Care Management Note   Amber Williams is a 44 y.o. year old female who is a primary care patient of [name], [name], MD. The CM team was consulted for assistance with chronic disease management and care coordination.   I reached out to [name] by phone today.   Review of patient status, including review of consultants reports, relevant laboratory and other test results, and collaboration with appropriate care team members and the patient's provider was performed as part of comprehensive patient evaluation and provision of chronic care management services.   Social determinants of health: risk of tobacco use; risk of depression    Office Visit from 05/20/2019 in [facility]  PHQ-9 Total Score  1     GAD 7 : Generalized Anxiety Score 04/01/2019  Nervous, Anxious, on Edge 1  Control/stop worrying 0  Worry too much - different things 0  Trouble relaxing 1  Restless 0  Easily annoyed or irritable 1  Afraid - awful might happen 0  Total GAD 7 Score 3  Anxiety Difficulty Somewhat difficult   Medications   (very important)  New medications from outside sources are available for reconciliation  ALLERGY RELIEF 180 MG tablet amitriptyline (ELAVIL) 50 MG tablet Cholecalciferol (VITAMIN D3) 5000 units CAPS escitalopram (LEXAPRO) 10 MG tablet flurazepam (DALMANE) 30 MG capsule levonorgestrel-ethinyl estradiol (NORDETTE) 0.15-30 MG-MCG tablet levothyroxine (SYNTHROID) 125 MCG tablet loperamide (IMODIUM) 1 MG/5ML solution mometasone (NASONEX) 50 MCG/ACT nasal spray Ocrelizumab (OCREVUS IV) omeprazole (PRILOSEC) 20 MG capsule traZODone (DESYREL) 150 MG tablet vitamin B-12 (CYANOCOBALAMIN) 500 MCG tablet  Goals    . Client will talk with LCSW in next 3 weeks to discuss stress/anxiety management for client (pt-stated)     Current Barriers:  . Client has MS (multiple sclerosis) and has Chronic Diagnoses also of Depression, PTSD, and  Hypothyroidism . Client has decreased energy . Decreased appetite  Clinical Social Work Clinical Goal(s):  [name] LCSW will talk with client in next 3 weeks about stress/anxiety management for client  Interventions: . Talked previously with client about pain issues of client . Talked with client previously about CCM program services . Previously encouraged client to talk with RNCM to discuss nursing needs of client . Previously talked with [facility] about her use of food stamps benefit . Talked previously with [place] about health needs of her sister . Talked previously with Amberlie about health needs of her mother . Previously talked with client about radiation treatments of client  Patient Self Care Activities:   Attends classes, as able a, at [place] medications as prescribed.  Plan:  Client to attend scheduled client medical appointments Client to communicate with RNCM to discuss nursing needs of client LCSW to call client in next 4 weeks to talk with her about managing anxiety and stress issues of client  Initial goal documentation       Follow Up Plan: LCSW to call client in next 4 weeks to talk with her about her managing anxiety and stress issues faced  [name].Janice Seales MSW, LCSW Licensed Clinical Social Worker [facility] Care [facility] [phone]

## 2019-05-23 ENCOUNTER — Other Ambulatory Visit: Payer: Self-pay | Admitting: Family Medicine

## 2019-05-26 ENCOUNTER — Ambulatory Visit: Payer: Self-pay | Admitting: Adult Health

## 2019-06-02 ENCOUNTER — Other Ambulatory Visit: Payer: Self-pay

## 2019-06-02 ENCOUNTER — Inpatient Hospital Stay (HOSPITAL_COMMUNITY): Payer: Medicare HMO | Attending: Hematology

## 2019-06-02 ENCOUNTER — Encounter (HOSPITAL_COMMUNITY)
Admission: RE | Admit: 2019-06-02 | Discharge: 2019-06-02 | Disposition: A | Discharge status: Home / Self Care | Payer: Medicare HMO | Source: Ambulatory Visit | Attending: Hematology | Admitting: Hematology

## 2019-06-02 DIAGNOSIS — I10 Essential (primary) hypertension: Secondary | ICD-10-CM | POA: Diagnosis not present

## 2019-06-02 DIAGNOSIS — Z79899 Other long term (current) drug therapy: Secondary | ICD-10-CM | POA: Insufficient documentation

## 2019-06-02 DIAGNOSIS — Z833 Family history of diabetes mellitus: Secondary | ICD-10-CM | POA: Diagnosis not present

## 2019-06-02 DIAGNOSIS — Z9884 Bariatric surgery status: Secondary | ICD-10-CM | POA: Diagnosis not present

## 2019-06-02 DIAGNOSIS — Z923 Personal history of irradiation: Secondary | ICD-10-CM | POA: Insufficient documentation

## 2019-06-02 DIAGNOSIS — G35 Multiple sclerosis: Secondary | ICD-10-CM | POA: Diagnosis not present

## 2019-06-02 DIAGNOSIS — E039 Hypothyroidism, unspecified: Secondary | ICD-10-CM | POA: Insufficient documentation

## 2019-06-02 DIAGNOSIS — Z803 Family history of malignant neoplasm of breast: Secondary | ICD-10-CM | POA: Insufficient documentation

## 2019-06-02 DIAGNOSIS — Z8249 Family history of ischemic heart disease and other diseases of the circulatory system: Secondary | ICD-10-CM | POA: Diagnosis not present

## 2019-06-02 DIAGNOSIS — Z86718 Personal history of other venous thrombosis and embolism: Secondary | ICD-10-CM | POA: Insufficient documentation

## 2019-06-02 DIAGNOSIS — Z9221 Personal history of antineoplastic chemotherapy: Secondary | ICD-10-CM | POA: Diagnosis not present

## 2019-06-02 DIAGNOSIS — Z793 Long term (current) use of hormonal contraceptives: Secondary | ICD-10-CM | POA: Diagnosis not present

## 2019-06-02 DIAGNOSIS — K5900 Constipation, unspecified: Secondary | ICD-10-CM | POA: Diagnosis not present

## 2019-06-02 DIAGNOSIS — F329 Major depressive disorder, single episode, unspecified: Secondary | ICD-10-CM | POA: Insufficient documentation

## 2019-06-02 DIAGNOSIS — Z87891 Personal history of nicotine dependence: Secondary | ICD-10-CM | POA: Insufficient documentation

## 2019-06-02 DIAGNOSIS — E785 Hyperlipidemia, unspecified: Secondary | ICD-10-CM | POA: Diagnosis not present

## 2019-06-02 DIAGNOSIS — Z7951 Long term (current) use of inhaled steroids: Secondary | ICD-10-CM | POA: Insufficient documentation

## 2019-06-02 DIAGNOSIS — C210 Malignant neoplasm of anus, unspecified: Secondary | ICD-10-CM | POA: Insufficient documentation

## 2019-06-02 LAB — COMPREHENSIVE METABOLIC PANEL
ALT: 12 U/L (ref 0–44)
AST: 14 U/L — ABNORMAL LOW (ref 15–41)
Albumin: 3.4 g/dL — ABNORMAL LOW (ref 3.5–5.0)
Alkaline Phosphatase: 67 U/L (ref 38–126)
Anion gap: 6 (ref 5–15)
BUN: 13 mg/dL (ref 6–20)
CO2: 26 mmol/L (ref 22–32)
Calcium: 9.5 mg/dL (ref 8.9–10.3)
Chloride: 104 mmol/L (ref 98–111)
Creatinine, Ser: 0.86 mg/dL (ref 0.44–1.00)
GFR calc Af Amer: >60 mL/min (ref >60)
GFR calc non Af Amer: >60 mL/min (ref >60)
Glucose, Bld: 82 mg/dL (ref 70–99)
Potassium: 4.4 mmol/L (ref 3.5–5.1)
Sodium: 136 mmol/L (ref 135–145)
Total Bilirubin: 0.6 mg/dL (ref 0.3–1.2)
Total Protein: 5.7 g/dL — ABNORMAL LOW (ref 6.5–8.1)

## 2019-06-02 LAB — CBC WITH DIFFERENTIAL/PLATELET
Abs Immature Granulocytes: 0.02 10*3/uL (ref 0.00–0.07)
Basophils Absolute: 0 10*3/uL (ref 0.0–0.1)
Basophils Relative: 0 %
Eosinophils Absolute: 0.1 10*3/uL (ref 0.0–0.5)
Eosinophils Relative: 2 %
HCT: 40.9 % (ref 36.0–46.0)
Hemoglobin: 12.8 g/dL (ref 12.0–15.0)
Immature Granulocytes: 0 %
Lymphocytes Relative: 7 %
Lymphs Abs: 0.5 10*3/uL — ABNORMAL LOW (ref 0.7–4.0)
MCH: 33.5 pg (ref 26.0–34.0)
MCHC: 31.3 g/dL (ref 30.0–36.0)
MCV: 107.1 fL — ABNORMAL HIGH (ref 80.0–100.0)
Monocytes Absolute: 0.5 10*3/uL (ref 0.1–1.0)
Monocytes Relative: 8 %
Neutro Abs: 5.5 10*3/uL (ref 1.7–7.7)
Neutrophils Relative %: 83 %
Platelets: 234 10*3/uL (ref 150–400)
RBC: 3.82 MIL/uL — ABNORMAL LOW (ref 3.87–5.11)
RDW: 13.2 % (ref 11.5–15.5)
WBC: 6.7 10*3/uL (ref 4.0–10.5)
nRBC: 0 % (ref 0.0–0.2)

## 2019-06-02 MED ORDER — FLUDEOXYGLUCOSE F - 18 (FDG) INJECTION
11.1000 | Freq: Once | INTRAVENOUS | Status: AC | PRN
  Administered 2019-06-02: 17:00:00 11.1 via INTRAVENOUS

## 2019-06-05 ENCOUNTER — Inpatient Hospital Stay (HOSPITAL_BASED_OUTPATIENT_CLINIC_OR_DEPARTMENT_OTHER): Payer: Medicare HMO | Admitting: Hematology

## 2019-06-05 ENCOUNTER — Other Ambulatory Visit: Payer: Self-pay

## 2019-06-05 ENCOUNTER — Encounter (HOSPITAL_COMMUNITY): Payer: Self-pay | Admitting: Hematology

## 2019-06-05 VITALS — BP 137/60 | HR 105 | Temp 98.6°F | Resp 18 | Wt 182.4 lb

## 2019-06-05 DIAGNOSIS — C210 Malignant neoplasm of anus, unspecified: Secondary | ICD-10-CM

## 2019-06-05 NOTE — Assessment & Plan Note (Signed)
1.  Anal squamous cell carcinoma, p16 positive: -Biopsy on 12/25/2018 consistent with invasive moderately differentiated keratinizing squamous cell carcinoma. -Colonoscopy 5 years ago in [facility], reportedly normal. -PET scan on 01/15/2019 did not show any evidence of metastatic disease. -XRT and 5-FU and mitomycin started on 02/03/2019. -03/03/2019 recieved 5-FU and mitomycin day 29. -We reviewed results of PET scan from 06/02/2019.  There is an uptake about the level of uptake of malignancy.  This could be treatment-related change.  No pelvic nodal hypermetabolism. -I have examined the patient.  The previous malignancy at the anterior anal verge is healing well.  Rectal examination did not perceive any masses. -Patient does report occasional bleeding when she is constipated.  I have made a referral to Dr. Arnoldo Morale for sigmoidoscopy and anoscopy. -I will see her back in 4 months with repeat PET CT scan.  2.  Multiple sclerosis: -Several years duration, resulting in right-sided weakness, fatigue, memory issues and headaches.  She walks with cane/walker. -Treated with multiple agents including Tecfidera, Betaseron, Avonex, Copaxone.  Also received mitoxantrone in the past. -She is on Ocrevus every 6 months. -She is seeing a new urologist soon.  3.  Family history: -Mother had breast cancer 3 years ago.  Sister was just diagnosed with breast cancer.

## 2019-06-05 NOTE — Progress Notes (Signed)
[place], [place] [address]   CLINIC:  Medical Oncology/Hematology  PCP:  [name], MD [place] [address] [phone]   REASON FOR VISIT:  Follow-up for anal cancer.  CURRENT THERAPY: 5-FU and mitomycin.  BRIEF ONCOLOGIC HISTORY:  Oncology History  Anal cancer ([place])  01/10/2019 Initial Diagnosis   Anal cancer ([place])   01/16/2019 Cancer Staging   Staging form: Anus, AJCC 8th Edition - Clinical: Stage Unknown (cTX, cN0, cM0) - Signed by Derek Jack, MD on 01/16/2019   02/03/2019 -  Chemotherapy   The patient had palonosetron (ALOXI) injection 0.25 mg, 0.25 mg, Intravenous,  Once, 1 of 1 cycle Administration: 0.25 mg (02/03/2019), 0.25 mg (03/03/2019) mitoMYcin (MUTAMYCIN) chemo injection 20 mg, 10.1 mg/m2 = 19.5 mg, Intravenous,  Once, 1 of 1 cycle Administration: 20 mg (02/03/2019), 20 mg (03/03/2019) fluorouracil (ADRUCIL) 7,900 mg in sodium chloride 0.9 % 92 mL chemo infusion, 1,000 mg/m2/day = 7,900 mg, Intravenous, 4D (96 hours ), 1 of 1 cycle Administration: 7,900 mg (02/03/2019), 7,900 mg (03/03/2019)  for chemotherapy treatment.       CANCER STAGING: Cancer Staging Anal cancer ([place]) Staging form: Anus, AJCC 8th Edition - Clinical: Stage Unknown (cTX, cN0, cM0) - Signed by Derek Jack, MD on 01/16/2019    INTERVAL HISTORY:  Ms. [place] 44 y.o. female seen for follow-up of anal squamous cell carcinoma.  She reports some constipation lately and is having occasional bleeding per rectum.  Denies any pain in the anal area.  Appetite is 100%.  Energy levels are 75%.  Overall she is feeling weak from her MS.   REVIEW OF SYSTEMS:  Review of Systems  Gastrointestinal: Positive for blood in stool and constipation.  All other systems reviewed and are negative.    PAST MEDICAL/SURGICAL HISTORY:  Past Medical History:  Diagnosis Date  . Allergy   . Anal cancer ([place]) 01/10/2019  . Arthritis     . Blood transfusion without reported diagnosis    2013  . Depression   . DVT (deep venous thrombosis) ([place]) 2013  . Gait abnormality 08/03/2016  . Hyperlipidemia   . Hypertension   . Hypothyroidism   . Multiple sclerosis ([place])   . Neurogenic bladder 08/03/2016  . Neuromuscular disorder ([place])    dx'd with MS at age of 44  . Port-A-Cath in place 01/23/2019  . PTSD (post-traumatic stress disorder)   . Right foot drop 08/03/2016  . Thyroid disease   . Ulcer    Past Surgical History:  Procedure Laterality Date  . COLON SURGERY     Colonoscopy  . GASTRIC BYPASS  2010  . HEMORRHOID SURGERY N/A 12/25/2018   Procedure: EXTENSIVE HEMORRHOIDECTOMY;  Surgeon: Aviva Signs, MD;  Location: [facility];  Service: General;  Laterality: N/A;  . INTERSTIM IMPLANT PLACEMENT  2014  . PORTACATH PLACEMENT       SOCIAL HISTORY:  Social History   Socioeconomic History  . Marital status: Single    Spouse name: Ricky  . Number of children: 0  . Years of education: 22  . Highest education level: Associate degree: occupational, Hotel manager, or vocational program  Occupational History  . Occupation: Disability  Tobacco Use  . Smoking status: Former Smoker    Packs/day: 1.50    Years: 15.00    Pack years: 22.50    Types: Cigarettes    Start date: 1993    Quit date: 01/30/2009    Years since quitting: 10.3  .  Smokeless tobacco: Never Used  Substance and Sexual Activity  . 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 Drug use: No  . Sexual activity: Yes  Other Topics Concern  . Not on file  Social History Narrative   Lives at home with her boyfriend   Right-handed   Caffeine: 2 glasses per day   Social Determinants of Health   Financial Resource Strain:   . Difficulty of Paying Living Expenses: Not on file  Food Insecurity:   . Worried About Charity fundraiser in the Last Year: Not on file  . Ran Out of Food in the Last Year: Not on file  Transportation Needs:   . Lack of  Transportation (Medical): Not on file  . Lack of Transportation (Non-Medical): Not on file  Physical Activity:   . Days of Exercise per Week: Not on file  . Minutes of Exercise per Session: Not on file  Stress:   . Feeling of Stress : Not on file  Social Connections: Unknown  . Frequency of Communication with Friends and Family: More than three times a week  . Frequency of Social Gatherings with Friends and Family: More than three times a week  . Attends Religious Services: Not on file  . Active Member of Clubs or Organizations: Not on file  . Attends Archivist Meetings: Not on file  . Marital Status: Living with partner  Intimate Partner Violence:   . Fear of Current or Ex-Partner: Not on file  . Emotionally Abused: Not on file  . Physically Abused: Not on file  . Sexually Abused: Not on file    FAMILY HISTORY:  Family History  Problem Relation Age of Onset  . Cancer Mother        Dx'd at age 65  . Breast cancer Mother   . Hypertension Father   . Diabetes Father   . Hypertension Sister   . Miscarriages / Stillbirths Sister   . Heart disease Maternal Grandmother   . Diabetes Maternal Grandmother   . Heart disease Maternal Grandfather   . Diabetes Paternal Grandmother   . Heart disease Paternal Grandfather   . Stroke Paternal Grandfather     CURRENT MEDICATIONS:  Outpatient Encounter Medications as of 06/05/2019  Medication Sig  . ALLERGY RELIEF 180 MG tablet Take 180 mg by mouth daily as needed for allergies.   [name] amitriptyline (ELAVIL) 50 MG tablet TAKE 1 TABLET BY MOUTH AT BEDTIME  . Cholecalciferol (VITAMIN D3) 5000 units CAPS Take 5,000 Units by mouth every morning.  . escitalopram (LEXAPRO) 10 MG tablet Take 1 tablet (10 mg total) by mouth daily.  . flurazepam (DALMANE) 30 MG capsule Take 1 capsule by mouth at bedtime  . levonorgestrel-ethinyl estradiol (NORDETTE) 0.15-30 MG-MCG tablet Take 1 tablet by mouth daily.  [name] levothyroxine (SYNTHROID) 125 MCG  tablet Take 1 tablet (125 mcg total) by mouth daily before breakfast.  . loperamide (IMODIUM) 1 MG/5ML solution Take 6 mg by mouth as needed for diarrhea or loose stools.   . mometasone (NASONEX) 50 MCG/ACT nasal spray Place 2 sprays into the nose 2 (two) times daily as needed (allergies).   Wess Botts (OCREVUS IV) Inject into the vein every 6 (six) months.   [name] omeprazole (PRILOSEC) 20 MG capsule TAKE 1 CAPSULE DAILY  . traZODone (DESYREL) 150 MG tablet Use from 1/3 to 1 tablet nightly as needed for sleep.  . vitamin B-12 (CYANOCOBALAMIN) 500 MCG tablet Take 500 mcg  by mouth daily.   No facility-administered encounter medications on file as of 06/05/2019.    ALLERGIES:  Allergies  Allergen Reactions  . Other Hives    Plastic Tape  . Tecfidera [Dimethyl Fumarate] Itching     PHYSICAL EXAM:  ECOG Performance status: 1  Vitals:   06/05/19 1400  BP: 137/60  Pulse: (!) 105  Resp: 18  Temp: 98.6 F (37 C)  SpO2: 100%   Filed Weights   06/05/19 1400  Weight: 182 lb 6 oz (82.7 kg)    Physical Exam Vitals reviewed.  Constitutional:      Appearance: Normal appearance.  Cardiovascular:     Rate and Rhythm: Normal rate and regular rhythm.     Heart sounds: Normal heart sounds.  Pulmonary:     Breath sounds: Normal breath sounds.  Abdominal:     General: There is no distension.     Palpations: Abdomen is soft. There is no mass.  Musculoskeletal:        General: No swelling.  Skin:    General: Skin is warm.  Neurological:     General: No focal deficit present.     Mental Status: She is alert and oriented to person, place, and time.  Psychiatric:        Mood and Affect: Mood normal.        Behavior: Behavior normal.      LABORATORY DATA:  I have reviewed the labs as listed.  CBC    Component Value Date/Time   WBC 6.7 06/02/2019 1537   RBC 3.82 (L) 06/02/2019 1537   HGB 12.8 06/02/2019 1537   HGB 11.8 05/20/2019 1627   HCT 40.9 06/02/2019 1537   HCT 36.4  05/20/2019 1627   PLT 234 06/02/2019 1537   PLT 314 05/20/2019 1627   MCV 107.1 (H) 06/02/2019 1537   MCV 104 (H) 05/20/2019 1627   MCH 33.5 06/02/2019 1537   MCHC 31.3 06/02/2019 1537   RDW 13.2 06/02/2019 1537   RDW 13.0 05/20/2019 1627   LYMPHSABS 0.5 (L) 06/02/2019 1537   LYMPHSABS 0.5 (L) 05/20/2019 1627   MONOABS 0.5 06/02/2019 1537   EOSABS 0.1 06/02/2019 1537   EOSABS 0.2 05/20/2019 1627   BASOSABS 0.0 06/02/2019 1537   BASOSABS 0.0 05/20/2019 1627   CMP Latest Ref Rng & Units 06/02/2019 05/20/2019 04/01/2019  Glucose 70 - 99 mg/dL 82 84 79  BUN 6 - 20 mg/dL 13 10 8   Creatinine 0.44 - 1.00 mg/dL 0.86 0.82 0.80  Sodium 135 - 145 mmol/L 136 143 138  Potassium 3.5 - 5.1 mmol/L 4.4 4.7 3.5  Chloride 98 - 111 mmol/L 104 108(H) 108  CO2 22 - 32 mmol/L 26 23 24   Calcium 8.9 - 10.3 mg/dL 9.5 9.4 8.8(L)  Total Protein 6.5 - 8.1 g/dL 5.7(L) 4.7(L) 5.0(L)  Total Bilirubin 0.3 - 1.2 mg/dL 0.6 <0.2 0.7  Alkaline Phos 38 - 126 U/L 67 69 65  AST 15 - 41 U/L 14(L) 11 11(L)  ALT 0 - 44 U/L 12 8 14        DIAGNOSTIC IMAGING:  I have independently reviewed the scans and discussed with the patient.     ASSESSMENT & PLAN:   Anal cancer ([place]) 1.  Anal squamous cell carcinoma, p16 positive: -Biopsy on 12/25/2018 consistent with invasive moderately differentiated keratinizing squamous cell carcinoma. -Colonoscopy 5 years ago in [facility], reportedly normal. -PET scan on 01/15/2019 did not show any evidence of metastatic disease. -XRT and 5-FU and mitomycin  started on 02/03/2019. -03/03/2019 recieved 5-FU and mitomycin day 29. -We reviewed results of PET scan from 06/02/2019.  There is an uptake about the level of uptake of malignancy.  This could be treatment-related change.  No pelvic nodal hypermetabolism. -I have examined the patient.  The previous malignancy at the anterior anal verge is healing well.  Rectal examination did not perceive any masses. -Patient does report occasional  bleeding when she is constipated.  I have made a referral to Dr. Arnoldo Morale for sigmoidoscopy and anoscopy. -I will see her back in 4 months with repeat PET CT scan.  2.  Multiple sclerosis: -Several years duration, resulting in right-sided weakness, fatigue, memory issues and headaches.  She walks with cane/walker. -Treated with multiple agents including Tecfidera, Betaseron, Avonex, Copaxone.  Also received mitoxantrone in the past. -She is on Ocrevus every 6 months. -She is seeing a new urologist soon.  3.  Family history: -Mother had breast cancer 3 years ago.  Sister was just diagnosed with breast cancer.    Orders placed this encounter:  Orders Placed This Encounter  Procedures  . NM PET Image Restag (PS) Skull Base To Thigh      Derek Jack, MD [place]  [phone]

## 2019-06-05 NOTE — Patient Instructions (Addendum)
[place] at [facility] Discharge Instructions  You were seen today by Dr. Delton Coombes. He went over your recent lab results. He will refer you back to Dr. Arnoldo Morale for the area in your rectum. He will repeat your PET scan in 4 months. He will see you back in 4 months for labs and follow up.   Thank you for choosing [place] at [facility] to provide your oncology and hematology care.  To afford each patient quality time with our provider, please arrive at least 15 minutes before your scheduled appointment time.   If you have a lab appointment with the [facility] please come in thru the  Main Entrance and check in at the main information desk  You need to re-schedule your appointment should you arrive 10 or more minutes late.  We strive to give you quality time with our providers, and arriving late affects you and other patients whose appointments are after yours.  Also, if you no show three or more times for appointments you may be dismissed from the clinic at the providers discretion.     Again, thank you for choosing [facility].  Our hope is that these requests will decrease the amount of time that you wait before being seen by our physicians.       _____________________________________________________________  Should you have questions after your visit to [facility], please contact our office at [phone] between the hours of 8:00 a.m. and 4:30 p.m.  Voicemails left after 4:00 p.m. will not be returned until the following business day.  For prescription refill requests, have your pharmacy contact our office and allow 72 hours.    Cancer Center Support Programs:   > Cancer Support Group  2nd Tuesday of the month 1pm-2pm, Journey Room

## 2019-06-09 ENCOUNTER — Ambulatory Visit (INDEPENDENT_AMBULATORY_CARE_PROVIDER_SITE_OTHER): Payer: Medicare HMO | Admitting: *Deleted

## 2019-06-09 ENCOUNTER — Other Ambulatory Visit: Payer: Self-pay

## 2019-06-09 DIAGNOSIS — G35 Multiple sclerosis: Secondary | ICD-10-CM

## 2019-06-09 DIAGNOSIS — R269 Unspecified abnormalities of gait and mobility: Secondary | ICD-10-CM

## 2019-06-09 DIAGNOSIS — C210 Malignant neoplasm of anus, unspecified: Secondary | ICD-10-CM

## 2019-06-09 DIAGNOSIS — M21371 Foot drop, right foot: Secondary | ICD-10-CM

## 2019-06-09 DIAGNOSIS — E039 Hypothyroidism, unspecified: Secondary | ICD-10-CM

## 2019-06-09 NOTE — Chronic Care Management (AMB) (Signed)
Chronic Care Management   Initial Visit Note  06/09/2019 Name: Amber Williams Williams MRN: [id number] DOB: 10-08-1975  Referred by: [name], MD Reason for referral : Chronic Care Management (RN Initial Outreach (2nd attempt))   Amber Williams Williams is a 44 y.o. year old female who is a primary care patient of [name], [name], MD. The CCM team was consulted for assistance with chronic disease management and care coordination needs related to Hypothyroidism,anal cancer, Multiple Sclerosis,PTSD, MDD  Review of patient status, including review of consultants reports, relevant laboratory and other test results, and collaboration with appropriate care team members and the patient's provider was performed as part of comprehensive patient evaluation and provision of chronic care management services.    Subjective: I spoke with Amber Williams Williams by telephone today regarding Amber Williams, recent falls, rectal bleeding (anal cancer), and family hx of breast cancer.  Objective: Outpatient Encounter Medications as of 06/09/2019  Medication Sig  . ALLERGY RELIEF 180 MG tablet Take 180 mg by mouth daily as needed for allergies.   [name] amitriptyline (ELAVIL) 50 MG tablet TAKE 1 TABLET BY MOUTH AT BEDTIME  . Cholecalciferol (VITAMIN D3) 5000 units CAPS Take 5,000 Units by mouth every morning.  . escitalopram (LEXAPRO) 10 MG tablet Take 1 tablet (10 mg total) by mouth daily.  . flurazepam (DALMANE) 30 MG capsule Take 1 capsule by mouth at bedtime  . levonorgestrel-ethinyl estradiol (NORDETTE) 0.15-30 MG-MCG tablet Take 1 tablet by mouth daily.  [name] levothyroxine (SYNTHROID) 125 MCG tablet Take 1 tablet (125 mcg total) by mouth daily before breakfast.  . loperamide (IMODIUM) 1 MG/5ML solution Take 6 mg by mouth as needed for diarrhea or loose stools.   . mometasone (NASONEX) 50 MCG/ACT nasal spray Place 2 sprays into the nose 2 (two) times daily as needed (allergies).   Wess Botts (OCREVUS IV) Inject into the vein every 6 (six)  months.   [name] omeprazole (PRILOSEC) 20 MG capsule TAKE 1 CAPSULE DAILY  . traZODone (DESYREL) 150 MG tablet Use from 1/3 to 1 tablet nightly as needed for sleep.  . vitamin B-12 (CYANOCOBALAMIN) 500 MCG tablet Take 500 mcg by mouth daily.   No facility-administered encounter medications on file as of 06/09/2019.    Fall Risk  06/09/2019 12/04/2018 11/22/2018 06/04/2018 02/04/2018  Falls in the past year? 1 1 1  0 No  Number falls in past yr: 1 1 1  0 -  Injury with Fall? 0 0 0 0 -  Risk Factor Category  - - - - -  Risk for fall due to : Impaired balance/gait;Impaired mobility;Other (Comment) - History of fall(s);Impaired balance/gait;Impaired mobility - -  Risk for fall due to: Comment Amber Williams and foot drop - - - -  Follow up Falls prevention discussed - - - -   Lab Results  Component Value Date   TSH 0.189 (L) 05/20/2019    RN Assessment & Care Plan   . 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   Nurse Case Manager Clinical Goal(s):  [name] Over the next 30 days, patient will work with Consulting civil engineer to learn more about genetic testing options and cost  Interventions:  . Chart reviewed . Discussed concerns with patient . Discussed medical history . Discussed family history o Mother with breast cancer at age 9 and sister at age 86. They have not had genetic testing.  . Placed call  to [facility] [phone] and left a message requesting a return call with more information on testing and cost . RN will follow-up with patient by telephone once more information has been received  Patient Self Care Activities:  . Performs ADL's independently . Performs IADL's independently  Initial goal documentation     . Fall Prevention       Increase in falls in patient with multiple sclerosis  Current Barriers:  . Chronic Disease Management support and education  needs related to multiple sclerosis . Knowledge deficit related to reason for increase in falls recently . Knowledge deficit related to fall prevention strategies  Nurse Case Manager Clinical Goal(s):  [name] Over the next 3 days, patient will talk with RN Care Manager regarding increase in falls . Over the next 30 days, patient will follow-up with neurologist to address Amber Williams and increase in falls  Interventions:  . Fall history discussed. Patient has Amber Williams and foot drop and often falls because of leg weakness.  . Discussed assistive devices. Patient uses a rolling walker and has shoe inserts for foot drop. She doesn't always wear the inserts in the winter because they are difficult to get into her winter boots . [name] Discussed medical alert system. Patient does not have one but has considered it.  o Referral to care guide to research medical alert system options . RN will follow-up with patient again over the next 2 weeks . Provided with CCM contact information and encouraged to reach out as needed  Patient Self Care Activities:  . Performs ADL's independently . Performs IADL's independently . Unable to independently drive  Please see past updates related to this goal by clicking on the "Past Updates" button in the selected goal      . Hypothyroidism       Current Barriers:  . Chronic Disease Management support and education needs related to hypothyroidism  Nurse Case Manager Clinical Goal(s):  [name] Over the next 12 months, patient will continue to work with PCP to manage her hypothyroidism.   Interventions:  . Evaluation of current treatment plan related to hypothyroidism and patient's adherence to plan as established by provider. . Reviewed medications with patient and discussed synthroid . Discussed plans with patient for ongoing care management follow up and provided patient with direct contact information for care management team . Chart reviewed including recent office notes an  labs . Advised patient to reach out to PCP with any s/s of hypo or hyperthyroidism  Patient Self Care Activities:  . Performs ADL's independently . Performs IADL's independently  Initial goal documentation     . Rectal Bleeding       Rectal bleeding with bowel movements in patient with anal cancer.   Current Barriers:  [name] Knowledge Deficits related to cause of new rectal bleeding  Nurse Case Manager Clinical Goal(s):  [name] Over the next 30 days, patient will follow-up with surgeon, Dr Arnoldo Morale, to evaluate cause of rectal bleeding  Interventions:  . Evaluation of current treatment plan related to rectal bleeding/anal cancer and patient's adherence to plan as established by provider. . Discussed plans with patient for ongoing care management follow up and provided patient with direct contact information for care management team . Discussed recent appointment with oncologist and upcoming appt with surgeon, Dr Arnoldo Morale . Encouraged patient to reach out to [facility] team as needed  Patient Self Care Activities:  . Performs ADL's independently . Performs IADL's independently  Initial goal documentation  Follow-up Plan:   The care management team will reach out to the patient again over the next 14 days.   [name], BSN, RN-BC Embedded Chronic Care Manager [facility] [place] [facility] Direct Dial: [phone]

## 2019-06-09 NOTE — Patient Instructions (Addendum)
Visit Information  Goals Addressed            This Visit's Progress     Patient Stated   . 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   Nurse Case Manager Clinical Goal(s):  [name] Over the next 30 days, patient will work with Consulting civil engineer to learn more about genetic testing options and cost  Interventions:  . Chart reviewed . Discussed concerns with patient . Discussed medical history . Discussed family history o Mother with breast cancer at age 43 and sister at age 9. They have not had genetic testing.  . Placed call to [facility] [phone] and left a message requesting a return call with more information on testing and cost . RN will follow-up with patient by telephone once more information has been received  Patient Self Care Activities:  . Performs ADL's independently . Performs IADL's independently  Initial goal documentation     . Patient Stated (pt-stated)        Other   . Fall Prevention       Increase in falls in patient with multiple sclerosis  Current Barriers:  . Chronic Disease Management support and education needs related to multiple sclerosis . Knowledge deficit related to reason for increase in falls recently . Knowledge deficit related to fall prevention strategies  Nurse Case Manager Clinical Goal(s):  [name] Over the next 3 days, patient will talk with RN Care Manager regarding increase in falls . Over the next 30 days, patient will follow-up with neurologist to address MS and increase in falls  Interventions:  . Fall history discussed. Patient has MS and foot drop and often falls because of leg weakness.  . Discussed assistive devices. Patient uses a rolling walker and has shoe inserts for foot drop. She doesn't always wear the inserts in the winter because they are difficult to get into her winter boots  . [name] Discussed medical alert system. Patient does not have one but has considered it.  o Referral to care guide to research medical alert system options . RN will follow-up with patient again over the next 2 weeks . Provided with CCM contact information and encouraged to reach out as needed  Patient Self Care Activities:  . Performs ADL's independently . Performs IADL's independently . Unable to independently drive  Please see past updates related to this goal by clicking on the "Past Updates" button in the selected goal      . Hypothyroidism       Current Barriers:  . Chronic Disease Management support and education needs related to hypothyroidism  Nurse Case Manager Clinical Goal(s):  [name] Over the next 12 months, patient will continue to work with PCP to manage her hypothyroidism.   Interventions:  . Evaluation of current treatment plan related to hypothyroidism and patient's adherence to plan as established by provider. . Reviewed medications with patient and discussed synthroid . Discussed plans with patient for ongoing care management follow up and provided patient with direct contact information for care management team . Chart reviewed including recent office notes an labs . Advised patient to reach out to PCP with any s/s of hypo or hyperthyroidism  Patient Self Care Activities:  . Performs ADL's independently . Performs IADL's independently  Initial goal documentation     . Rectal Bleeding  Rectal bleeding with bowel movements in patient with anal cancer.   Current Barriers:  [name] Knowledge Deficits related to cause of new rectal bleeding  Nurse Case Manager Clinical Goal(s):  [name] Over the next 30 days, patient will follow-up with surgeon, Dr Arnoldo Morale, to evaluate cause of rectal bleeding  Interventions:  . Evaluation of current treatment plan related to rectal bleeding/anal cancer and patient's adherence to plan as established by provider. . Discussed plans with  patient for ongoing care management follow up and provided patient with direct contact information for care management team . Discussed recent appointment with oncologist and upcoming appt with surgeon, Dr Arnoldo Morale . Encouraged patient to reach out to [facility] team as needed  Patient Self Care Activities:  . Performs ADL's independently . Performs IADL's independently  Initial goal documentation        The care management team will reach out to the patient again over the next 14 days.    [name], BSN, RN-BC Embedded Chronic Care Manager [facility] [place] Management Direct Dial: [phone]   The patient verbalized understanding of instructions provided today and declined a print copy of patient instruction materials.

## 2019-06-11 ENCOUNTER — Telehealth: Payer: Self-pay

## 2019-06-11 NOTE — Telephone Encounter (Signed)
06/11/2019 Left message on voicemail to return my call regarding medical alert system. Ambrose Mantle [phone]

## 2019-06-16 ENCOUNTER — Other Ambulatory Visit: Payer: Self-pay | Admitting: Family Medicine

## 2019-06-16 ENCOUNTER — Telehealth: Payer: Self-pay

## 2019-06-16 NOTE — Telephone Encounter (Signed)
06/16/2019 Spoke with patient about [place] and community resources for cancer patients needing financial assistance.  Ambrose Mantle [phone]

## 2019-06-17 ENCOUNTER — Ambulatory Visit (INDEPENDENT_AMBULATORY_CARE_PROVIDER_SITE_OTHER): Payer: Medicare HMO | Admitting: General Surgery

## 2019-06-17 VITALS — BP 139/83 | HR 113 | Temp 97.8°F | Ht 65.0 in | Wt 185.0 lb

## 2019-06-17 DIAGNOSIS — C210 Malignant neoplasm of anus, unspecified: Secondary | ICD-10-CM

## 2019-06-18 ENCOUNTER — Encounter: Payer: Self-pay | Admitting: General Surgery

## 2019-06-18 ENCOUNTER — Ambulatory Visit: Payer: Self-pay | Admitting: Licensed Clinical Social Worker

## 2019-06-18 DIAGNOSIS — F324 Major depressive disorder, single episode, in partial remission: Secondary | ICD-10-CM | POA: Diagnosis not present

## 2019-06-18 DIAGNOSIS — E039 Hypothyroidism, unspecified: Secondary | ICD-10-CM | POA: Diagnosis not present

## 2019-06-18 DIAGNOSIS — G35 Multiple sclerosis: Secondary | ICD-10-CM

## 2019-06-18 DIAGNOSIS — F4310 Post-traumatic stress disorder, unspecified: Secondary | ICD-10-CM

## 2019-06-18 DIAGNOSIS — F331 Major depressive disorder, recurrent, moderate: Secondary | ICD-10-CM | POA: Diagnosis not present

## 2019-06-18 NOTE — Progress Notes (Signed)
Amber Williams; [id number]; [birth year]   HPI Patient is a 44 year old white female who has completed neoadjuvant radiation and chemotherapy for anal carcinoma who was referred back to my care by Dr. Delton Coombes for rectal examination.  Patient had a follow-up PET scan which revealed a possible abnormal lesion higher in the rectum.  To assess the effectiveness of the neoadjuvant therapy, Dr. Delton Coombes has requested a sigmoidoscopy and anoscopy of the area.  Patient denies any pain. Past Medical History:  Diagnosis Date  . Allergy   . Anal cancer ([place]) 01/10/2019  . Arthritis   . Blood transfusion without reported diagnosis    2013  . Depression   . DVT (deep venous thrombosis) ([place]) 2013  . Gait abnormality 08/03/2016  . Hyperlipidemia   . Hypertension   . Hypothyroidism   . Multiple sclerosis ([place])   . Neurogenic bladder 08/03/2016  . Neuromuscular disorder ([place])    dx'd with MS at age of 68  . Port-A-Cath in place 01/23/2019  . PTSD (post-traumatic stress disorder)   . Right foot drop 08/03/2016  . Thyroid disease   . Ulcer     Past Surgical History:  Procedure Laterality Date  . COLON SURGERY     Colonoscopy  . GASTRIC BYPASS  2010  . HEMORRHOID SURGERY N/A 12/25/2018   Procedure: EXTENSIVE HEMORRHOIDECTOMY;  Surgeon: Aviva Signs, MD;  Location: [facility];  Service: General;  Laterality: N/A;  . INTERSTIM IMPLANT PLACEMENT  2014  . PORTACATH PLACEMENT      Family History  Problem Relation Age of Onset  . Cancer Mother        Dx'd at age 70  . Breast cancer Mother   . Hypertension Father   . Diabetes Father   . Hypertension Sister   . Miscarriages / Stillbirths Sister   . Heart disease Maternal Grandmother   . Diabetes Maternal Grandmother   . Heart disease Maternal Grandfather   . Diabetes Paternal Grandmother   . Heart disease Paternal Grandfather   . Stroke Paternal Grandfather     Current Outpatient Medications on File Prior to Visit  Medication Sig Dispense  Refill  . ALLERGY RELIEF 180 MG tablet Take 180 mg by mouth daily as needed for allergies.     [name] amitriptyline (ELAVIL) 50 MG tablet TAKE 1 TABLET BY MOUTH AT BEDTIME 30 tablet 3  . Cholecalciferol (VITAMIN D3) 5000 units CAPS Take 5,000 Units by mouth every morning.    . escitalopram (LEXAPRO) 10 MG tablet Take 1 tablet (10 mg total) by mouth daily. 90 tablet 1  . flurazepam (DALMANE) 30 MG capsule Take 1 capsule by mouth at bedtime 30 capsule 3  . levonorgestrel-ethinyl estradiol (NORDETTE) 0.15-30 MG-MCG tablet Take 1 tablet by mouth daily. 1 Package 11  . levothyroxine (SYNTHROID) 125 MCG tablet Take 1 tablet (125 mcg total) by mouth daily before breakfast. 90 tablet 0  . loperamide (IMODIUM) 1 MG/5ML solution Take 6 mg by mouth as needed for diarrhea or loose stools.     . mometasone (NASONEX) 50 MCG/ACT nasal spray Place 2 sprays into the nose 2 (two) times daily as needed (allergies).     Wess Botts (OCREVUS IV) Inject into the vein every 6 (six) months.     [name] omeprazole (PRILOSEC) 20 MG capsule TAKE 1 CAPSULE DAILY 90 capsule 0  . traZODone (DESYREL) 150 MG tablet Use from 1/3 to 1 tablet nightly as needed for sleep. 30 tablet 5  . vitamin B-12 (CYANOCOBALAMIN) 500 MCG tablet  Take 500 mcg by mouth daily.     No current facility-administered medications on file prior to visit.    Allergies  Allergen Reactions  . Other Hives    Plastic Tape  . 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 Packs/day: 1.50  . Years: 15.00  . Pack years: 22.50  . Types: Cigarettes  . Start date: 29  . Quit date: 01/30/2009  . Years since quitting: 10.3  Smokeless Tobacco Never Used    Review of Systems  Constitutional: Positive for malaise/fatigue.  HENT: Negative.   Eyes: Negative.   Respiratory: Negative.    Cardiovascular: Negative.   Gastrointestinal: Negative.   Genitourinary: Negative.   Musculoskeletal: Negative.   Skin: Negative.   Neurological: Negative.   Endo/Heme/Allergies: Negative.   Psychiatric/Behavioral: Negative.     Objective   Vitals:   06/18/19 1011  BP: 139/83  Pulse: (!) 113  Temp: 97.8 F (36.6 C)    Physical Exam Vitals reviewed.  Constitutional:      Appearance: Normal appearance. She is not ill-appearing.  HENT:     Head: Normocephalic and atraumatic.  Cardiovascular:     Rate and Rhythm: Normal rate and regular rhythm.     Heart sounds: Normal heart sounds. No murmur. No friction rub. No gallop.   Pulmonary:     Effort: Pulmonary effort is normal. No respiratory distress.     Breath sounds: Normal breath sounds. No stridor. No wheezing, rhonchi or rales.  Skin:    General: Skin is warm and dry.  Neurological:     Mental Status: She is alert and oriented to person, place, and time.   PET scan images personally reviewed  Assessment  Anal carcinoma, abnormality seen on PET scan Plan   Patient is scheduled to undergo flexible sigmoidoscopy with anoscopy on 06/25/2019.  The risks and benefits of the procedure were fully explained to the patient, who gave informed consent.

## 2019-06-18 NOTE — Chronic Care Management (AMB) (Signed)
  Care Management Note   Amber Williams is a 44 y.o. year old female who is a primary care patient of [name], [name], MD. The CM team was consulted for assistance with chronic disease management and care coordination.   I reached out to [name] by phone today.   Review of patient status, including review of consultants reports, relevant laboratory and other test results, and collaboration with appropriate care team members and the patient's provider was performed as part of comprehensive patient evaluation and provision of chronic care management services.   Social determinants of health: risk of tobacco use; risk of depression    Office Visit from 05/20/2019 in [facility]  PHQ-9 Total Score  1     GAD 7 : Generalized Anxiety Score 04/01/2019  Nervous, Anxious, on Edge 1  Control/stop worrying 0  Worry too much - different things 0  Trouble relaxing 1  Restless 0  Easily annoyed or irritable 1  Afraid - awful might happen 0  Total GAD 7 Score 3  Anxiety Difficulty Somewhat difficult   Medications   ALLERGY RELIEF 180 MG tablet amitriptyline (ELAVIL) 50 MG tablet Cholecalciferol (VITAMIN D3) 5000 units CAPS escitalopram (LEXAPRO) 10 MG tablet flurazepam (DALMANE) 30 MG capsule levonorgestrel-ethinyl estradiol (NORDETTE) 0.15-30 MG-MCG tablet levothyroxine (SYNTHROID) 125 MCG tablet loperamide (IMODIUM) 1 MG/5ML solution mometasone (NASONEX) 50 MCG/ACT nasal spray Ocrelizumab (OCREVUS IV) omeprazole (PRILOSEC) 20 MG capsule traZODone (DESYREL) 150 MG tablet vitamin B-12 (CYANOCOBALAMIN) 500 MCG tablet  Goals        . Client will talk with LCSW in next 3 weeks to discuss stress/anxiety management for client (pt-stated)     Current Barriers:  . Client has MS (multiple sclerosis) and has Chronic Diagnoses also of Depression, PTSD, and Hypothyroidism . Client has decreased energy . Decreased appetite  Clinical Social Work Clinical  Goal(s):  [name] LCSW will talk with client in next 3 weeks about stress/anxiety management for client  Interventions: . Talked with client about pain issues of client . Talked with client about CCM program services . Encouraged client to talk with RNCM to discuss nursing needs of client . Talked with [facility] about her use of food stamps benefit . Talked with Ivan about health needs of her sister . Talked with Blannie about health needs of her mother . Talked with client about radiation treatments of client . Talked with client about ambulation challenges (she is using cane to help her walk) . Provided counseling support to client . Talked with client about financial needs of client  Patient Self Care Activities:   Attends classes, as able a, at [place] medications as prescribed.  Plan:  Client to attend scheduled client medical appointments Client to communicate with RNCM to discuss nursing needs of client LCSW to call client in next 4 weeks to talk with her about managing anxiety and stress issues of client  Initial goal documentation       Follow Up Plan: LCSW to call client in next 4 weeks to talk with client about her management of anxiety and stress issues faced  [name].Diasha Castleman MSW, LCSW Licensed Clinical Social Worker [facility] Care Management [phone]

## 2019-06-18 NOTE — Patient Instructions (Addendum)
Licensed Clinical Social Worker Visit Information  Goals we discussed today:  Goals        . Client will talk with LCSW in next 3 weeks to discuss stress/anxiety management for client (pt-stated)     Current Barriers:  . Client has MS (multiple sclerosis) and has Chronic Diagnoses also of Depression, PTSD, and Hypothyroidism . Client has decreased energy . Decreased appetite  Clinical Social Work Clinical Goal(s):  [name] LCSW will talk with client in next 3 weeks about stress/anxiety management for client  Interventions: . Talked with client about pain issues of client . Talked with client about CCM program services . Encouraged client to talk with RNCM to discuss nursing needs of client . Talked with [facility] about her use of food stamps benefit . Talked with Raylei about health needs of her sister . Talked with Kasy about health needs of her mother . Talked with client about radiation treatments of client  Talked with client about ambulation challenges (she is using cane to help her walk)  Provided counseling support to client  Talked with client about financial needs of client  Patient Self Williams Activities:   Attends classes, as able a, at [place] medications as prescribed.  Plan:  Client to attend scheduled client medical appointments Client to communicate with RNCM to discuss nursing needs of client LCSW to call client in next 4 weeks to talk with her about managing anxiety and stress issues of client  Initial goal documentation            Materials Provided: No  Follow Up Plan: LCSW to call client in next 4 weeks to talk with her about her management of anxiety and stress issues of client  The patient verbalized understanding of instructions provided today and declined a print copy of patient instruction materials.   Amber Williams.[name] MSW, LCSW Licensed Clinical Social Worker Amber Williams  Management [phone]

## 2019-06-19 ENCOUNTER — Other Ambulatory Visit: Payer: Self-pay | Admitting: Family Medicine

## 2019-06-20 ENCOUNTER — Ambulatory Visit: Payer: Medicare HMO | Admitting: *Deleted

## 2019-06-20 DIAGNOSIS — F331 Major depressive disorder, recurrent, moderate: Secondary | ICD-10-CM | POA: Diagnosis not present

## 2019-06-20 DIAGNOSIS — E039 Hypothyroidism, unspecified: Secondary | ICD-10-CM | POA: Diagnosis not present

## 2019-06-20 DIAGNOSIS — F324 Major depressive disorder, single episode, in partial remission: Secondary | ICD-10-CM | POA: Diagnosis not present

## 2019-06-20 DIAGNOSIS — C210 Malignant neoplasm of anus, unspecified: Secondary | ICD-10-CM

## 2019-06-20 NOTE — Chronic Care Management (AMB) (Signed)
Chronic Care Management   Follow Up Note   06/20/2019 Name: Amber Williams MRN: [id number] DOB: [birth year]  Referred by: [name], MD Reason for referral : Chronic Care Management (RN Care coordination and follow-up)   Amber Williams is a 44 y.o. year old female who is a primary care patient of [name], [name], MD. The CCM team was consulted for assistance with chronic disease management and care coordination needs.    Review of patient status, including review of consultants reports, relevant laboratory and other test results, and collaboration with appropriate care team members and the patient's provider was performed as part of comprehensive patient evaluation and provision of chronic care management services.    SDOH (Social Determinants of Health) assessments performed: No    Outpatient Encounter Medications as of 06/20/2019  Medication Sig  . ALLERGY RELIEF 180 MG tablet Take 180 mg by mouth daily as needed for allergies.   Amber Williams amitriptyline (ELAVIL) 50 MG tablet TAKE 1 TABLET BY MOUTH AT BEDTIME  . Cholecalciferol (VITAMIN D3) 5000 units CAPS Take 5,000 Units by mouth every morning.  . escitalopram (LEXAPRO) 10 MG tablet Take 1 tablet (10 mg total) by mouth daily.  . flurazepam (DALMANE) 30 MG capsule Take 1 capsule by mouth at bedtime  . levonorgestrel-ethinyl estradiol (NORDETTE) 0.15-30 MG-MCG tablet Take 1 tablet by mouth daily.  Amber Williams levothyroxine (SYNTHROID) 125 MCG tablet TAKE 1 TABLET BY MOUTH DAILY BEFORE BREAKFAST  . loperamide (IMODIUM) 1 MG/5ML solution Take 6 mg by mouth as needed for diarrhea or loose stools.   . mometasone (NASONEX) 50 MCG/ACT nasal spray Place 2 sprays into the nose 2 (two) times daily as needed (allergies).   Wess Botts (OCREVUS IV) Inject into the vein every 6 (six) months.   Amber Williams omeprazole (PRILOSEC) 20 MG capsule TAKE 1 CAPSULE DAILY  . traZODone (DESYREL) 150 MG tablet Use from 1/3 to 1 tablet nightly as needed for sleep.  . vitamin  B-12 (CYANOCOBALAMIN) 500 MCG tablet Take 500 mcg by mouth daily.   No facility-administered encounter medications on file as of 06/20/2019.     RN Care Plan   . 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   Nurse Case Manager Clinical Goal(s):  Amber Williams Over the next 7 days, patient will talk with genetic testing scheduler at the [facility] to schedule appointment for genetic testing  Interventions:  . Chart reviewed . Previously discussed concerns with patient . Previously discussed family history o Mother with breast cancer at age 93 and sister at age 46. They have not had genetic testing.  Amber Williams Reached out to [facility] [phone] o They will reach out to [facility] to schedule genetic testing. o They do genetic test once a month and it may be April before they have an opening . Updated patient by telephone and she expressed appreciation for the coordination  Patient Self Care Activities:  . Performs ADL's independently . Performs IADL's independently  Please see past updates related to this goal by clicking on the "Past Updates" button in the selected goal      . Rectal Bleeding       Rectal bleeding with bowel movements in patient with anal cancer and anxiety/depression r/t upcoming surgery.   Current Barriers:  Amber Williams Knowledge Deficits related to cause of new rectal bleeding  Nurse Case Manager Clinical Goal(s):  .  Over the next 30 days, patient will follow-up with surgeon, Dr Arnoldo Morale, to evaluate cause of rectal bleeding  Interventions:  . Evaluation of current treatment plan related to rectal bleeding/anal cancer and patient's adherence to plan as established by provider. . Discussed plans with patient for ongoing care management follow up and provided patient with direct contact information for care management team . Discussed scheduled  surgery with Dr Arnoldo Morale next week . Encouraged patient to reach out to [facility] team as needed for nursing care needs and for psychosocial support  Patient Self Care Activities:  . Performs ADL's independently . Performs IADL's independently  Please see past updates related to this goal by clicking on the "Past Updates" button in the selected goal          Plan:   The care management team will reach out to the patient again over the next 45 days.   [name], BSN, RN-BC Embedded Chronic Care Manager [facility] [place] Management Direct Dial: [phone]

## 2019-06-20 NOTE — Patient Instructions (Signed)
Visit Information  Goals Addressed            This Visit's Progress     Patient Stated   . 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   Nurse Case Manager Clinical Goal(s):  [name] Over the next 7 days, patient will talk with genetic testing scheduler at the [facility] to schedule appointment for genetic testing  Interventions:  . Chart reviewed . Previously discussed concerns with patient . Previously discussed family history o Mother with breast cancer at age 67 and sister at age 26. They have not had genetic testing.  [name] Reached out to [facility] [phone] o They will reach out to [facility] to schedule genetic testing. o They do genetic test once a month and it may be April before they have an opening . Updated patient by telephone and she expressed appreciation for the coordination  Patient Self Care Activities:  . Performs ADL's independently . Performs IADL's independently  Please see past updates related to this goal by clicking on the "Past Updates" button in the selected goal        Other   . Rectal Bleeding       Rectal bleeding with bowel movements in patient with anal cancer.   Current Barriers:  [name] Knowledge Deficits related to cause of new rectal bleeding  Nurse Case Manager Clinical Goal(s):  [name] Over the next 30 days, patient will follow-up with surgeon, Dr Arnoldo Morale, to evaluate cause of rectal bleeding  Interventions:  . Evaluation of current treatment plan related to rectal bleeding/anal cancer and patient's adherence to plan as established by provider. . Discussed plans with patient for ongoing care management follow up and provided patient with direct contact information for care management team . Discussed scheduled surgery with Dr Arnoldo Morale next week . Encouraged patient to reach out to [facility] team as needed for nursing  care needs and for psychosocial support  Patient Self Care Activities:  . Performs ADL's independently . Performs IADL's independently  Please see past updates related to this goal by clicking on the "Past Updates" button in the selected goal       The care management team will reach out to the patient again over the next 45 days.   [name], BSN, RN-BC Embedded Chronic Care Manager [facility] [place] [facility] Direct Dial: [phone]   The patient verbalized understanding of instructions provided today and declined a print copy of patient instruction materials.

## 2019-06-20 NOTE — H&P (Signed)
Amber Williams; [id number]; 10-Oct-1975   HPI Patient is a 44 year old white female who has completed neoadjuvant radiation and chemotherapy for anal carcinoma who was referred back to my care by Dr. Delton Coombes for rectal examination.  Patient had a follow-up PET scan which revealed a possible abnormal lesion higher in the rectum.  To assess the effectiveness of the neoadjuvant therapy, Dr. Delton Coombes has requested a sigmoidoscopy and anoscopy of the area.  Patient denies any pain. Past Medical History:  Diagnosis Date  . Allergy   . Anal cancer ([place]) 01/10/2019  . Arthritis   . Blood transfusion without reported diagnosis    2013  . Depression   . DVT (deep venous thrombosis) ([place]) 2013  . Gait abnormality 08/03/2016  . Hyperlipidemia   . Hypertension   . Hypothyroidism   . Multiple sclerosis ([place])   . Neurogenic bladder 08/03/2016  . Neuromuscular disorder ([place])    dx'd with MS at age of 89  . Port-A-Cath in place 01/23/2019  . PTSD (post-traumatic stress disorder)   . Right foot drop 08/03/2016  . Thyroid disease   . Ulcer     Past Surgical History:  Procedure Laterality Date  . COLON SURGERY     Colonoscopy  . GASTRIC BYPASS  2010  . HEMORRHOID SURGERY N/A 12/25/2018   Procedure: EXTENSIVE HEMORRHOIDECTOMY;  Surgeon: Aviva Signs, MD;  Location: [facility];  Service: General;  Laterality: N/A;  . INTERSTIM IMPLANT PLACEMENT  2014  . PORTACATH PLACEMENT      Family History  Problem Relation Age of Onset  . Cancer Mother        Dx'd at age 67  . Breast cancer Mother   . Hypertension Father   . Diabetes Father   . Hypertension Sister   . Miscarriages / Stillbirths Sister   . Heart disease Maternal Grandmother   . Diabetes Maternal Grandmother   . Heart disease Maternal Grandfather   . Diabetes Paternal Grandmother   . Heart disease Paternal Grandfather   . Stroke Paternal Grandfather     Current Outpatient Medications on File Prior to Visit  Medication Sig Dispense  Refill  . ALLERGY RELIEF 180 MG tablet Take 180 mg by mouth daily as needed for allergies.     [name] amitriptyline (ELAVIL) 50 MG tablet TAKE 1 TABLET BY MOUTH AT BEDTIME 30 tablet 3  . Cholecalciferol (VITAMIN D3) 5000 units CAPS Take 5,000 Units by mouth every morning.    . escitalopram (LEXAPRO) 10 MG tablet Take 1 tablet (10 mg total) by mouth daily. 90 tablet 1  . flurazepam (DALMANE) 30 MG capsule Take 1 capsule by mouth at bedtime 30 capsule 3  . levonorgestrel-ethinyl estradiol (NORDETTE) 0.15-30 MG-MCG tablet Take 1 tablet by mouth daily. 1 Package 11  . levothyroxine (SYNTHROID) 125 MCG tablet Take 1 tablet (125 mcg total) by mouth daily before breakfast. 90 tablet 0  . loperamide (IMODIUM) 1 MG/5ML solution Take 6 mg by mouth as needed for diarrhea or loose stools.     . mometasone (NASONEX) 50 MCG/ACT nasal spray Place 2 sprays into the nose 2 (two) times daily as needed (allergies).     Wess Botts (OCREVUS IV) Inject into the vein every 6 (six) months.     [name] omeprazole (PRILOSEC) 20 MG capsule TAKE 1 CAPSULE DAILY 90 capsule 0  . traZODone (DESYREL) 150 MG tablet Use from 1/3 to 1 tablet nightly as needed for sleep. 30 tablet 5  . vitamin B-12 (CYANOCOBALAMIN) 500 MCG tablet  Take 500 mcg by mouth daily.     No current facility-administered medications on file prior to visit.    Allergies  Allergen Reactions  . Other Hives    Plastic Tape  . 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 Packs/day: 1.50  . Years: 15.00  . Pack years: 22.50  . Types: Cigarettes  . Start date: 34  . Quit date: 01/30/2009  . Years since quitting: 10.3  Smokeless Tobacco Never Used    Review of Systems  Constitutional: Positive for malaise/fatigue.  HENT: Negative.   Eyes: Negative.   Respiratory: Negative.    Cardiovascular: Negative.   Gastrointestinal: Negative.   Genitourinary: Negative.   Musculoskeletal: Negative.   Skin: Negative.   Neurological: Negative.   Endo/Heme/Allergies: Negative.   Psychiatric/Behavioral: Negative.     Objective   Vitals:   06/18/19 1011  BP: 139/83  Pulse: (!) 113  Temp: 97.8 F (36.6 C)    Physical Exam Vitals reviewed.  Constitutional:      Appearance: Normal appearance. She is not ill-appearing.  HENT:     Head: Normocephalic and atraumatic.  Cardiovascular:     Rate and Rhythm: Normal rate and regular rhythm.     Heart sounds: Normal heart sounds. No murmur. No friction rub. No gallop.   Pulmonary:     Effort: Pulmonary effort is normal. No respiratory distress.     Breath sounds: Normal breath sounds. No stridor. No wheezing, rhonchi or rales.  Skin:    General: Skin is warm and dry.  Neurological:     Mental Status: She is alert and oriented to person, place, and time.   PET scan images personally reviewed  Assessment  Anal carcinoma, abnormality seen on PET scan Plan   Patient is scheduled to undergo flexible sigmoidoscopy with anoscopy on 06/25/2019.  The risks and benefits of the procedure were fully explained to the patient, who gave informed consent.

## 2019-06-23 ENCOUNTER — Other Ambulatory Visit: Payer: Self-pay

## 2019-06-23 ENCOUNTER — Encounter (HOSPITAL_COMMUNITY): Payer: Self-pay

## 2019-06-23 ENCOUNTER — Other Ambulatory Visit (HOSPITAL_COMMUNITY)
Admission: RE | Admit: 2019-06-23 | Discharge: 2019-06-23 | Disposition: A | Discharge status: Home / Self Care | Payer: Medicare HMO | Source: Ambulatory Visit | Attending: General Surgery | Admitting: General Surgery

## 2019-06-23 ENCOUNTER — Encounter (HOSPITAL_COMMUNITY)
Admission: RE | Admit: 2019-06-23 | Discharge: 2019-06-23 | Disposition: A | Discharge status: Home / Self Care | Payer: Medicare HMO | Source: Ambulatory Visit | Attending: General Surgery | Admitting: General Surgery

## 2019-06-23 DIAGNOSIS — Z01812 Encounter for preprocedural laboratory examination: Secondary | ICD-10-CM | POA: Insufficient documentation

## 2019-06-23 LAB — SARS CORONAVIRUS 2 (TAT 6-24 HRS): SARS Coronavirus 2: NEGATIVE

## 2019-06-23 LAB — HCG, SERUM, QUALITATIVE: Preg, Serum: NEGATIVE

## 2019-06-23 NOTE — Progress Notes (Signed)
NEUROLOGY CONSULTATION NOTE  Amber Williams MRN: [id number] DOB: March 26, 1976  Referring provider: [name], MD Primary care provider: [name], MD  Reason for consult:  Multiple sclerosis  HISTORY OF PRESENT ILLNESS: Amber Williams is a 44 year old right-handed white female with HTN, HLD, hypothyroidism, PTSD/depression, arthritis, and history of anal cancer and DVT who presents for multiple sclerosis.  History supplemented by prior neurologist's and referring provider's notes.  In 1993, when she was 44 years old, she developed a severe headache with nausea and projectile vomiting.  Over the next several days she developed double vision and unsteady gait.  Symptoms persisted for over 3 weeks and she had an MRI of brain which was abnormal.  He underwent CSF analysis and was diagnosed with ADEM vs MS.  She was treated with Solu-Medrol and symptoms improved.  She was doing well until May 1996 when she developed acute onset numbness and tingling in her hands and feet, as well as unsteady gait and back pain.  MRI of brain was indicative of demyelinating disease and she was admitted to the hospital for Solu-Medrol.  Diagnosis of multiple sclerosis was definitively established.    She has a history of multiple drug resistance.  She was initially started on Betaseron but changed DMT over the years mostly due to breakthrough relapses as well as side effects.  Most recently, she was on Ocrevus.  She did well on Ocrevus but had to stop because she was diagnosed with anal cancer and had to undergo radiation and chemotherapy.  Her last infusion was in May 2020.  She finished radiation and chemotherapy in November but has to wait 6 months before restarting Ocrevus.  However, recent scans are concerning for possible disease and she is scheduled for a biopsy tomorrow to determine if she has a recurrence of her cancer.    Motor:  Longstanding right leg weakness but started having left leg weakness  around November when finishing chemotherapy and radiation therapy for anal cancer.  Arms are okay. Sensory:  No issues Pain:  Nocturnal cramping in legs Gait:  Unsteady gait.  Requires walker since diagnosis of anal cancer at end of August 2020.   Bowel/Bladder:  Bladder dysfunction.  With bladder stimulator Fatigue:  yes Cognition:  Reports some short-term memory deficits.  She has trouble focusing in school (she is at [employer]).  She is still getting As and Bs.   Mood:  Better since starting Lexapro. She has longstanding difficulty with insomnia.  Last flare-up in late October while finishing up chemo and radiation therapy.  She developed fairly sudden onset left leg weakness and it has not improved.    Reportedly JC Virus positive.  Current DMT:  Ocrevus (s/p 3 doses, she was due in November but on-hold due to receiving cancer treatment at the time) Past DMT:  Betaseron (1996-1998, 2 flare ups); Avonex (for 6 months, flu-like symptoms and myalgias); Copaxone (for 1 year, maybe 2 flare-ups); Rebif (9 months; multiple flare-ups); novantrone (2 years, no relapses); Betaseron (started 6 years late, on for 2 years); Gilenya (1 year, 3 relapses); Tecfidera (rash); rituximab (2016-2017; unhelpful for symptoms)  Other current medications:  Lexapro 10mg  (depression/anxiety); trazodone (insomnia); amitriptyline 50mg  (nocturnal leg cramps); Synthroid; flurazepam; D3 5000 IU daily; Imodium; Nordette; B12 Past medications:  Baclofen; Ampyra; oxybutynin  Imaging: 05/16/1991 MRI BRAIN W WO:  Multiple areas of abnormal signal intensity, predominantly involving the white matter, most ill-defined and rather large in size, including temporal  lobes bilaterally (right more than left), left anterior cerebellum and adjacent pons, right cerebellar white matter, right pons, periventricular white matter in patchy distribution, and right globus pallidus.  Focal rounded area of abnormal T2  signal in left periventricular white matter measuring 12 mm.  Some cortical gray matter involvement including right medial temporal lobe, left cerebellum and peripheral left temporal lobe.  Abnormal enhancement adjacent to the frontal horns of lateral ventricles and adjacent 4th ventricle, principally on the left sided, and t in the right temporal lobe and left pons. 03/23/1992 MRI BRAIN W WO:  Compared to 05/16/91, marked improvement in multiple areas of increased T2 signal with less evidence hyperintense foci in the medial temporal lobes, but some mild increased signal intensity in the medial temporal and uncal regions.  Abnormal signal foci in the medial occipital region, left > right middle cerebellar peduncles, bilateral left > right cerebellar hemispheres, and centrum semiovale are resolved.  Hyperintense focus adjacent to the left lateral venticle is smaller.  Single focus of enhancement. 11/27/2000 MRA HEAD:  Normal examination 12/31/2013 MRI BRAIN W WO:  Multiple abnormal lesions in the periventricular white matter, centrum semiovale, corona radiata and subcortical white matter and corpus callosum, unchanged compared to imaging from 07/15/13.  Some lesions with associated encephalomalacia.  No abnormal enhancement. 12/31/2013 MRI CERVICAL W WO:  Several confluent foci of abnormal cord signal beginning at C1 and extending to C6-7.  No abnormal enhancement.  Image motion-degraded.   12/31/2013 MRI THORACIC W WO:  No definite abnormal signal within the cord or abnormal enhancement.  However, study degraded by motion artifact.  04/03/2014 MRI BRAIN W WO:  Extensive periventricular, callosal and subcortical white matter lesions in the supratentorial white matter, similar to exam from 12/31/13.  Index lesion left corona radiata measures 8 mm, similar to previous.  No acute lesions. 04/03/2014 MRI CERVICAL W WO:  Abnormal signal throughout cervical spinal cord.  No abnormal enhancement 10/20/2014 MRI BRAIN W  WO:  Multiple T2 and FLAIR hyperintense foci within cerebral hemispheric predominantly periventricular white matter; subtle T2 focus in the left middle cerebellar peduncle immediately lateral to the fourth ventricular.  Overall stable compared to prior imaging from December 2015. 10/20/2014 MRI CERVICAL W WO:  Abnormal signal throughout cervical spinal cord without significant change compared to December 2015.  No abnormal enhancement. 12/06/2015 MRI BRAIN W WO:  10-20 T2/FLAIR lesions in periventricular/juxtacortical/infratentonrial lesions; less than 5 low-signal T1 lesions; mild corpus callosum volume loss; no acute or new lesions compared to MRI from 10/20/14. 12/06/2015 MRI CERVICAL W WO:  Not well visualized below C5 level.  Stable patchy T2 hyperintense foci within cord greatest at posterior dorsal cord at C3-4 level.  No acute lesions.  Labs: 08/31;2011:  Zinc 0.83; folate >20 06/23/2011:  ANA positive, 1:640 speckled; RF <20; sed rate 7 05/27/2013:  B12 1,241 03/20/2014:  Flow cytometry/leukemia panel with few B-cell events with very rare CD20+ B-cells. 12/14/2015:  TSH 0.48; free T4 0.86; free T3 2.73; cortisol 5;    PAST MEDICAL HISTORY: Past Medical History:  Diagnosis Date  . Allergy   . Anal cancer ([place]) 01/10/2019  . Arthritis   . Blood transfusion without reported diagnosis    2013  . Depression   . DVT (deep venous thrombosis) ([place]) 2013  . Gait abnormality 08/03/2016  . Hyperlipidemia   . Hypertension   . Hypothyroidism   . Multiple sclerosis ([place])   . Neurogenic bladder 08/03/2016  . Neuromuscular disorder ([place])    dx'd  with MS at age of 35  . Port-A-Cath in place 01/23/2019  . PTSD (post-traumatic stress disorder)   . Right foot drop 08/03/2016  . Thyroid disease   . Ulcer     PAST SURGICAL HISTORY: Past Surgical History:  Procedure Laterality Date  . COLON SURGERY     Colonoscopy  . GASTRIC BYPASS  2010  . HEMORRHOID SURGERY N/A 12/25/2018   Procedure:  EXTENSIVE HEMORRHOIDECTOMY;  Surgeon: Aviva Signs, MD;  Location: [facility];  Service: General;  Laterality: N/A;  . INTERSTIM IMPLANT PLACEMENT  2014  . PORTACATH PLACEMENT      MEDICATIONS: Current Outpatient Medications on File Prior to Visit  Medication Sig Dispense Refill  . ALLERGY RELIEF 180 MG tablet Take 180 mg by mouth daily as needed for allergies.     [name] amitriptyline (ELAVIL) 50 MG tablet TAKE 1 TABLET BY MOUTH AT BEDTIME 30 tablet 3  . Cholecalciferol (VITAMIN D3) 5000 units CAPS Take 5,000 Units by mouth every morning.    . escitalopram (LEXAPRO) 10 MG tablet Take 1 tablet (10 mg total) by mouth daily. 90 tablet 1  . flurazepam (DALMANE) 30 MG capsule Take 1 capsule by mouth at bedtime 30 capsule 3  . levonorgestrel-ethinyl estradiol (NORDETTE) 0.15-30 MG-MCG tablet Take 1 tablet by mouth daily. 1 Package 11  . levothyroxine (SYNTHROID) 125 MCG tablet TAKE 1 TABLET BY MOUTH DAILY BEFORE BREAKFAST 90 tablet 0  . loperamide (IMODIUM) 1 MG/5ML solution Take 6 mg by mouth as needed for diarrhea or loose stools.     . mometasone (NASONEX) 50 MCG/ACT nasal spray Place 2 sprays into the nose 2 (two) times daily as needed (allergies).     Wess Botts (OCREVUS IV) Inject into the vein every 6 (six) months.     [name] omeprazole (PRILOSEC) 20 MG capsule TAKE 1 CAPSULE DAILY 90 capsule 0  . traZODone (DESYREL) 150 MG tablet Use from 1/3 to 1 tablet nightly as needed for sleep. 30 tablet 5  . vitamin B-12 (CYANOCOBALAMIN) 500 MCG tablet Take 500 mcg by mouth daily.     No current facility-administered medications on file prior to visit.    ALLERGIES: Allergies  Allergen Reactions  . Other Hives    Plastic Tape  . Tecfidera [Dimethyl Fumarate] Itching    FAMILY HISTORY: Family History  Problem Relation Age of Onset  . Cancer Mother        Dx'd at age 75  . Breast cancer Mother   . Hypertension Father   . Diabetes Father   . Hypertension Sister   . Miscarriages / Stillbirths  Sister   . Heart disease Maternal Grandmother   . Diabetes Maternal Grandmother   . Heart disease Maternal Grandfather   . Diabetes Paternal Grandmother   . Heart disease Paternal Grandfather   . Stroke Paternal Grandfather     SOCIAL HISTORY: Social History   Socioeconomic History  . Marital status: Single    Spouse name: Ricky  . Number of children: 0  . Years of education: 92  . Highest education level: Associate degree: occupational, Hotel manager, or vocational program  Occupational History  . Occupation: Disability  Tobacco Use  . Smoking status: Former Smoker    Packs/day: 1.50    Years: 15.00    Pack years: 22.50    Types: Cigarettes    Start date: 71    Quit date: 01/30/2009    Years since quitting: 10.4  . Smokeless tobacco: Never Used  Substance and Sexual Activity  .  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 Drug use: No  . Sexual activity: Yes  Other Topics Concern  . Not on file  Social History Narrative   Lives at home with her boyfriend   Right-handed   Caffeine: 2 glasses per day   Social Determinants of Health   Financial Resource Strain:   . Difficulty of Paying Living Expenses: Not on file  Food Insecurity:   . Worried About Charity fundraiser in the Last Year: Not on file  . Ran Out of Food in the Last Year: Not on file  Transportation Needs:   . Lack of Transportation (Medical): Not on file  . Lack of Transportation (Non-Medical): Not on file  Physical Activity:   . Days of Exercise per Week: Not on file  . Minutes of Exercise per Session: Not on file  Stress:   . Feeling of Stress : Not on file  Social Connections: Unknown  . Frequency of Communication with Friends and Family: More than three times a week  . Frequency of Social Gatherings with Friends and Family: More than three times a week  . Attends Religious Services: Not on file  . Active Member of Clubs or Organizations: Not on file  . Attends Theatre manager Meetings: Not on file  . Marital Status: Living with partner  Intimate Partner Violence:   . Fear of Current or Ex-Partner: Not on file  . Emotionally Abused: Not on file  . Physically Abused: Not on file  . Sexually Abused: Not on file    REVIEW OF SYSTEMS: Constitutional: No fevers, chills, or sweats, no generalized fatigue, change in appetite Eyes: No visual changes, double vision, eye pain Ear, nose and throat: No hearing loss, ear pain, nasal congestion, sore throat Cardiovascular: No chest pain, palpitations Respiratory:  No shortness of breath at rest or with exertion, wheezes GastrointestinaI: No nausea, vomiting, diarrhea, abdominal pain, fecal incontinence Genitourinary:  No dysuria, urinary retention or frequency Musculoskeletal:  No neck pain, back pain Integumentary: No rash, pruritus, skin lesions Neurological: as above Psychiatric: No depression, insomnia, anxiety Endocrine: No palpitations, fatigue, diaphoresis, mood swings, change in appetite, change in weight, increased thirst Hematologic/Lymphatic:  No purpura, petechiae. Allergic/Immunologic: no itchy/runny eyes, nasal congestion, recent allergic reactions, rashes  PHYSICAL EXAM: Blood pressure 120/79, pulse (!) 105, resp. rate 18, height 5\' 5"  (1.651 m), weight 188 lb (85.3 kg), last menstrual period 06/16/2019, SpO2 99 %. General: No acute distress.  Patient appears well-groomed.  Head:  Normocephalic/atraumatic Eyes:  fundi examined but not visualized Neck: supple, no paraspinal tenderness, full range of motion Back: No paraspinal tenderness Heart: regular rate and rhythm Lungs: Clear to auscultation bilaterally. Vascular: No carotid bruits. Neurological Exam: Mental status: alert and oriented to person, place, and time, recent and remote memory intact, fund of knowledge intact, attention and concentration intact, speech fluent and not dysarthric, language intact. Cranial nerves: CN I: not  tested CN II: pupils equal, round and reactive to light, visual fields intact CN III, IV, VI:  full range of motion, no nystagmus, no ptosis CN V: facial sensation intact CN VII: upper and lower face symmetric CN VIII: hearing intact CN IX, X: gag intact, uvula midline CN XI: sternocleidomastoid and trapezius muscles intact CN XII: tongue midline Bulk & Tone: normal, no fasciculations. Motor:  2/5 bilateral hip flexion, 4/5 right knee flexion, right foot drop; otherwise 5/5. Sensation:  Pinprick sensation intact; and vibration sensation slightly reduced  in . Deep Tendon Reflexes:  3+ throughout, bilateral Babinski Finger to nose testing:  Without dysmetria.  Heel to shin:  Unable to assess Gait:  Ambulates with walker.  IMPRESSION: Multiple sclerosis Anal cancer  She cannot restart Ocrevus until 6 months out from radiation and chemotherapy.  Unfortunately, she may have had a recurrence of cancer which means that she may need to restart therapy.  We will see what the biopsy shows.  Other options include [place] and [place].  PLAN: 1.  Check vitamin D level 2.  Further recommendations pending results.  Thank you for allowing me to take part in the care of this patient.  Metta Clines, DO  CC: [name], MD

## 2019-06-23 NOTE — Patient Instructions (Signed)
Amber Williams  06/23/2019     @PREFPERIOPPHARMACY @   Your procedure is scheduled on  06/25/2019   Report to [facility] at  1040  A.M.  Call this number if you have problems the morning of surgery:  [phone]   Remember:  Do not eat or drink after midnight.                        Take these medicines the morning of surgery with A SIP OF WATER  Lexapro, levothyroxine, prilosec.    Do not wear jewelry, make-up or nail polish.  Do not wear lotions, powders, or perfumes. Please wear deodorant and brush your teeth.  Do not shave 48 hours prior to surgery.  Men may shave face and neck.  Do not bring valuables to the hospital.  [facility] is not responsible for any belongings or valuables.  Contacts, dentures or bridgework may not be worn into surgery.  Leave your suitcase in the car.  After surgery it may be brought to your room.  For patients admitted to the hospital, discharge time will be determined by your treatment team.  Patients discharged the day of surgery will not be allowed to drive home.   Name and phone number of your driver:   family Special instructions:  DO NOT smoke the day of your surgery.  Please read over the following fact sheets that you were given. Anesthesia Post-op Instructions and Care and Recovery After Surgery       Flexible Bronchoscopy, Care After This sheet gives you information about how to care for yourself after your test. Your doctor may also give you more specific instructions. If you have problems or questions, contact your doctor. Follow these instructions at home: Eating and drinking  Do not eat or drink anything (not even water) for 2 hours after your test, or until your numbing medicine (local anesthetic) wears off.  When your numbness is gone and your cough and gag reflexes have come back, you may: ? Eat only soft foods. ? Slowly drink liquids.  The day after the test, go back to your normal diet. Driving  Do not  drive for 24 hours if you were given a medicine to help you relax (sedative).  Do not drive or use heavy machinery while taking prescription pain medicine. General instructions   Take over-the-counter and prescription medicines only as told by your doctor.  Return to your normal activities as told. Ask what activities are safe for you.  Do not use any products that have nicotine or tobacco in them. This includes cigarettes and e-cigarettes. If you need help quitting, ask your doctor.  Keep all follow-up visits as told by your doctor. This is important. It is very important if you had a tissue sample (biopsy) taken. Get help right away if:  You have shortness of breath that gets worse.  You get light-headed.  You feel like you are going to pass out (faint).  You have chest pain.  You cough up: ? More than a little blood. ? More blood than before. Summary  Do not eat or drink anything (not even water) for 2 hours after your test, or until your numbing medicine wears off.  Do not use cigarettes. Do not use e-cigarettes.  Get help right away if you have chest pain. This information is not intended to replace advice given to you by your health care provider. Make sure  you discuss any questions you have with your health care provider. Document Revised: 03/30/2017 Document Reviewed: 05/05/2016 Elsevier Patient Education  2020 [place] After These instructions provide you with information about caring for yourself after your procedure. Your health care provider may also give you more specific instructions. Your treatment has been planned according to current medical practices, but problems sometimes occur. Call your health care provider if you have any problems or questions after your procedure. What can I expect after the procedure? After your procedure, you may:  Feel sleepy for several hours.  Feel clumsy and have poor balance for several  hours.  Feel forgetful about what happened after the procedure.  Have poor judgment for several hours.  Feel nauseous or vomit.  Have a sore throat if you had a breathing tube during the procedure. Follow these instructions at home: For at least 24 hours after the procedure:      Have a responsible adult stay with you. It is important to have someone help care for you until you are awake and alert.  Rest as needed.  Do not: ? Participate in activities in which you could fall or become injured. ? Drive. ? Use heavy machinery. ? Drink alcohol. ? Take sleeping pills or medicines that cause drowsiness. ? Make important decisions or sign legal documents. ? Take care of children on your own. Eating and drinking  Follow the diet that is recommended by your health care provider.  If you vomit, drink water, juice, or soup when you can drink without vomiting.  Make sure you have little or no nausea before eating solid foods. General instructions  Take over-the-counter and prescription medicines only as told by your health care provider.  If you have sleep apnea, surgery and certain medicines can increase your risk for breathing problems. Follow instructions from your health care provider about wearing your sleep device: ? Anytime you are sleeping, including during daytime naps. ? While taking prescription pain medicines, sleeping medicines, or medicines that make you drowsy.  If you smoke, do not smoke without supervision.  Keep all follow-up visits as told by your health care provider. This is important. Contact a health care provider if:  You keep feeling nauseous or you keep vomiting.  You feel light-headed.  You develop a rash.  You have a fever. Get help right away if:  You have trouble breathing. Summary  For several hours after your procedure, you may feel sleepy and have poor judgment.  Have a responsible adult stay with you for at least 24 hours or until  you are awake and alert. This information is not intended to replace advice given to you by your health care provider. Make sure you discuss any questions you have with your health care provider. Document Revised: 07/16/2017 Document Reviewed: 08/08/2015 Elsevier Patient Education  [place].

## 2019-06-24 ENCOUNTER — Ambulatory Visit (INDEPENDENT_AMBULATORY_CARE_PROVIDER_SITE_OTHER): Payer: Medicare HMO | Admitting: Neurology

## 2019-06-24 ENCOUNTER — Other Ambulatory Visit: Payer: Medicare HMO

## 2019-06-24 ENCOUNTER — Encounter: Payer: Self-pay | Admitting: Neurology

## 2019-06-24 VITALS — BP 120/79 | HR 105 | Resp 18 | Ht 65.0 in | Wt 188.0 lb

## 2019-06-24 DIAGNOSIS — G35 Multiple sclerosis: Secondary | ICD-10-CM

## 2019-06-24 NOTE — Patient Instructions (Addendum)
1.  Let me know about the results of the biopsy 2.  We will check vitamin D level today 3.  Follow up 6 months but further recommendations pending results of biopsy  Your provider has requested that you have labwork completed today. Please go to [facility] ([id number]) on the second floor of this building before leaving the office today. You do not need to check in. If you are not called within 15 minutes please check with the front desk.

## 2019-06-25 ENCOUNTER — Ambulatory Visit (HOSPITAL_COMMUNITY): Payer: Medicare HMO | Admitting: Anesthesiology

## 2019-06-25 ENCOUNTER — Encounter (HOSPITAL_COMMUNITY)
Admission: RE | Disposition: A | Discharge status: Home / Self Care | Payer: Self-pay | Source: Ambulatory Visit | Attending: General Surgery

## 2019-06-25 ENCOUNTER — Ambulatory Visit (HOSPITAL_COMMUNITY)
Admission: RE | Admit: 2019-06-25 | Discharge: 2019-06-25 | Disposition: A | Discharge status: Home / Self Care | Payer: Medicare HMO | Source: Ambulatory Visit | Attending: General Surgery | Admitting: General Surgery

## 2019-06-25 ENCOUNTER — Encounter (HOSPITAL_COMMUNITY): Payer: Self-pay | Admitting: General Surgery

## 2019-06-25 DIAGNOSIS — R933 Abnormal findings on diagnostic imaging of other parts of digestive tract: Secondary | ICD-10-CM | POA: Insufficient documentation

## 2019-06-25 DIAGNOSIS — I10 Essential (primary) hypertension: Secondary | ICD-10-CM | POA: Insufficient documentation

## 2019-06-25 DIAGNOSIS — K219 Gastro-esophageal reflux disease without esophagitis: Secondary | ICD-10-CM | POA: Diagnosis not present

## 2019-06-25 DIAGNOSIS — E039 Hypothyroidism, unspecified: Secondary | ICD-10-CM | POA: Diagnosis not present

## 2019-06-25 DIAGNOSIS — Z86718 Personal history of other venous thrombosis and embolism: Secondary | ICD-10-CM | POA: Diagnosis not present

## 2019-06-25 DIAGNOSIS — F419 Anxiety disorder, unspecified: Secondary | ICD-10-CM | POA: Insufficient documentation

## 2019-06-25 DIAGNOSIS — G35 Multiple sclerosis: Secondary | ICD-10-CM | POA: Insufficient documentation

## 2019-06-25 DIAGNOSIS — Z9221 Personal history of antineoplastic chemotherapy: Secondary | ICD-10-CM | POA: Insufficient documentation

## 2019-06-25 DIAGNOSIS — Z793 Long term (current) use of hormonal contraceptives: Secondary | ICD-10-CM | POA: Insufficient documentation

## 2019-06-25 DIAGNOSIS — Z923 Personal history of irradiation: Secondary | ICD-10-CM | POA: Insufficient documentation

## 2019-06-25 DIAGNOSIS — Z87891 Personal history of nicotine dependence: Secondary | ICD-10-CM | POA: Insufficient documentation

## 2019-06-25 DIAGNOSIS — Z79890 Hormone replacement therapy (postmenopausal): Secondary | ICD-10-CM | POA: Diagnosis not present

## 2019-06-25 DIAGNOSIS — Z9884 Bariatric surgery status: Secondary | ICD-10-CM | POA: Diagnosis not present

## 2019-06-25 DIAGNOSIS — C210 Malignant neoplasm of anus, unspecified: Secondary | ICD-10-CM

## 2019-06-25 DIAGNOSIS — F329 Major depressive disorder, single episode, unspecified: Secondary | ICD-10-CM | POA: Insufficient documentation

## 2019-06-25 DIAGNOSIS — Z85048 Personal history of other malignant neoplasm of rectum, rectosigmoid junction, and anus: Secondary | ICD-10-CM | POA: Diagnosis not present

## 2019-06-25 DIAGNOSIS — Z79899 Other long term (current) drug therapy: Secondary | ICD-10-CM | POA: Insufficient documentation

## 2019-06-25 HISTORY — PX: FLEXIBLE SIGMOIDOSCOPY: SHX5431

## 2019-06-25 LAB — VITAMIN D 25 HYDROXY (VIT D DEFICIENCY, FRACTURES): Vit D, 25-Hydroxy: 63 ng/mL (ref 30–100)

## 2019-06-25 SURGERY — SIGMOIDOSCOPY, FLEXIBLE
Anesthesia: General

## 2019-06-25 MED ORDER — LACTATED RINGERS IV SOLN: INTRAVENOUS | Status: DC

## 2019-06-25 MED ORDER — PROMETHAZINE HCL 25 MG/ML IJ SOLN: 6.2500 mg | INTRAMUSCULAR | Status: DC | PRN

## 2019-06-25 MED ORDER — MIDAZOLAM HCL 5 MG/5ML IJ SOLN
INTRAMUSCULAR | Status: DC | PRN
  Administered 2019-06-25: 2 mg via INTRAVENOUS

## 2019-06-25 MED ORDER — PROPOFOL 10 MG/ML IV BOLUS
INTRAVENOUS | Status: AC
  Filled 2019-06-25: qty 20

## 2019-06-25 MED ORDER — FENTANYL CITRATE (PF) 100 MCG/2ML IJ SOLN
INTRAMUSCULAR | Status: DC | PRN
  Administered 2019-06-25: 100 ug via INTRAVENOUS

## 2019-06-25 MED ORDER — PROPOFOL 10 MG/ML IV BOLUS
INTRAVENOUS | Status: DC | PRN
  Administered 2019-06-25: 160 mg via INTRAVENOUS

## 2019-06-25 MED ORDER — CHLORHEXIDINE GLUCONATE CLOTH 2 % EX PADS: 6.0000 | MEDICATED_PAD | Freq: Once | CUTANEOUS | Status: DC

## 2019-06-25 MED ORDER — MIDAZOLAM HCL 2 MG/2ML IJ SOLN: 0.5000 mg | Freq: Once | INTRAMUSCULAR | Status: DC | PRN

## 2019-06-25 MED ORDER — ONDANSETRON HCL 4 MG/2ML IJ SOLN
INTRAMUSCULAR | Status: DC | PRN
  Administered 2019-06-25: 4 mg via INTRAVENOUS

## 2019-06-25 MED ORDER — FENTANYL CITRATE (PF) 100 MCG/2ML IJ SOLN
INTRAMUSCULAR | Status: AC
  Filled 2019-06-25: qty 2

## 2019-06-25 MED ORDER — HYDROCODONE-ACETAMINOPHEN 7.5-325 MG PO TABS: 1.0000 | ORAL_TABLET | Freq: Once | ORAL | Status: DC | PRN

## 2019-06-25 MED ORDER — MIDAZOLAM HCL 2 MG/2ML IJ SOLN
INTRAMUSCULAR | Status: AC
  Filled 2019-06-25: qty 2

## 2019-06-25 MED ORDER — HYDROMORPHONE HCL 1 MG/ML IJ SOLN: 0.2500 mg | INTRAMUSCULAR | Status: DC | PRN

## 2019-06-25 SURGICAL SUPPLY — 4 items
GLOVE BIOGEL PI IND STRL 7.0 (GLOVE) IMPLANT
GLOVE BIOGEL PI INDICATOR 7.0 (GLOVE) ×1
GOWN STRL REUS W/TWL LRG LVL3 (GOWN DISPOSABLE) ×1 IMPLANT
SURGILUBE 2OZ TUBE FLIPTOP (MISCELLANEOUS) ×1 IMPLANT

## 2019-06-25 NOTE — Transfer of Care (Signed)
Immediate Anesthesia Transfer of Care Note  Patient: Amber Williams  Procedure(s) Performed: FLEXIBLE SIGMOIDOSCOPY ANOSCOPY (N/A )  Patient Location: PACU  Anesthesia Type:General  Level of Consciousness: awake  Airway & Oxygen Therapy: Patient Spontanous Breathing  Post-op Assessment: Report given to RN  Post vital signs: Reviewed and stable  Last Vitals:  Vitals Value Taken Time  BP    Temp    Pulse 98 06/25/19 1248  Resp 12 06/25/19 1248  SpO2 97 % 06/25/19 1248  Vitals shown include unvalidated device data.  Last Pain:  Vitals:   06/25/19 1108  TempSrc: Oral  PainSc: 4       Patients Stated Pain Goal: 7 ([id number])  Complications: No apparent anesthesia complications

## 2019-06-25 NOTE — Discharge Instructions (Signed)
Flexible Sigmoidoscopy, Care After This sheet gives you information about how to care for yourself after your procedure. Your health care provider may also give you more specific instructions. If you have problems or questions, contact your health care provider. What can I expect after the procedure? After the procedure, it is common to have:  Abdominal cramping or pain.  Bloating.  A small amount of rectal bleeding if you had a biopsy. Follow these instructions at home:  Take over-the-counter and prescription medicines only as told by your health care provider.  Do not drive for 24 hours if you received a medicine to help you relax (sedative).  Keep all follow-up visits as told by your health care provider. This is important. Contact a health care provider if:  You have abdominal pain or cramping that gets worse or is not helped with medicine.  You continue to have small amounts of rectal bleeding after 24 hours.  You have nausea or vomiting.  You feel weak or dizzy.  You have a fever. Get help right away if:  You pass large blood clots or see a large amount of blood in the toilet after having a bowel movement.  You have nausea or vomiting for more than 24 hours after the procedure. This information is not intended to replace advice given to you by your health care provider. Make sure you discuss any questions you have with your health care provider. Document Revised: 12/09/2015 Document Reviewed: 07/17/2015 Elsevier Patient Education  2020 [employer]  

## 2019-06-25 NOTE — Anesthesia Postprocedure Evaluation (Signed)
Anesthesia Post Note  Patient: Amber Williams  Procedure(s) Performed: FLEXIBLE SIGMOIDOSCOPY ANOSCOPY (N/A )  Patient location during evaluation: PACU Anesthesia Type: General Level of consciousness: awake and alert and oriented Pain management: pain level controlled Vital Signs Assessment: post-procedure vital signs reviewed and stable Respiratory status: spontaneous breathing Cardiovascular status: blood pressure returned to baseline and stable Postop Assessment: no apparent nausea or vomiting Anesthetic complications: no     Last Vitals:  Vitals:   06/25/19 1300 06/25/19 1319  BP: 120/65 126/70  Pulse: 99 96  Resp: 10 16  Temp:  (!) 36.3 C  SpO2: 98% 100%    Last Pain:  Vitals:   06/25/19 1319  TempSrc: Oral  PainSc: 0-No pain                 Kassidie Hendriks

## 2019-06-25 NOTE — Anesthesia Preprocedure Evaluation (Signed)
Anesthesia Evaluation  Patient identified by MRN, date of birth, ID band Patient awake    Reviewed: Allergy & Precautions, NPO status , Patient's Chart, lab work & pertinent test results  Airway Mallampati: II  TM Distance: >3 FB Neck ROM: Full    Dental no notable dental hx. (+) Teeth Intact   Pulmonary neg pulmonary ROS, former smoker,    Pulmonary exam normal breath sounds clear to auscultation       Cardiovascular Exercise Tolerance: Good hypertension, Pt. on medications negative cardio ROS Normal cardiovascular examI Rhythm:Regular Rate:Normal     Neuro/Psych PSYCHIATRIC DISORDERS Anxiety Depression H/o PTSDMS -dx'd at 83. Uses cane   Neuromuscular disease    GI/Hepatic Neg liver ROS, GERD  Medicated and Controlled,  Endo/Other  Hypothyroidism   Renal/GU negative Renal ROS  negative genitourinary   Musculoskeletal  (+) Arthritis , Osteoarthritis,    Abdominal   Peds negative pediatric ROS (+)  Hematology negative hematology ROS (+) H/o remote DVT   Anesthesia Other Findings Rectal Ca - here for EUA -possible Bx  Reproductive/Obstetrics negative OB ROS                             Anesthesia Physical Anesthesia Plan  ASA: III  Anesthesia Plan: General   Post-op Pain Management:    Induction: Intravenous  PONV Risk Score and Plan: 3 and Ondansetron, Dexamethasone, Treatment may vary due to age or medical condition and Midazolam  Airway Management Planned: LMA  Additional Equipment:   Intra-op Plan:   Post-operative Plan:   Informed Consent: I have reviewed the patients History and Physical, chart, labs and discussed the procedure including the risks, benefits and alternatives for the proposed anesthesia with the patient or authorized representative who has indicated his/her understanding and acceptance.     Dental advisory given  Plan Discussed with:  CRNA  Anesthesia Plan Comments: (Plan Full PPE use  Plan GA(LMA) with GETA as needed d/w pt -WTP with same after Q&A)        Anesthesia Quick Evaluation

## 2019-06-25 NOTE — Interval H&P Note (Signed)
History and Physical Interval Note:  06/25/2019 11:42 AM  Amber Williams  has presented today for surgery, with the diagnosis of anal cancer.  The various methods of treatment have been discussed with the patient and family. After consideration of risks, benefits and other options for treatment, the patient has consented to  Procedure(s): FLEXIBLE SIGMOIDOSCOPY WITH ANOSCOPY/BX (N/A) as a surgical intervention.  The patient's history has been reviewed, patient examined, no change in status, stable for surgery.  I have reviewed the patient's chart and labs.  Questions were answered to the patient's satisfaction.     Aviva Signs

## 2019-06-25 NOTE — Anesthesia Procedure Notes (Addendum)
Procedure Name: LMA Insertion Date/Time: 06/25/2019 12:14 PM Performed by: Jonna Munro, CRNA Pre-anesthesia Checklist: Patient identified, Patient being monitored, Emergency Drugs available, Timeout performed and Suction available Patient Re-evaluated:Patient Re-evaluated prior to induction Oxygen Delivery Method: Circle System Utilized Preoxygenation: Pre-oxygenation with 100% oxygen Induction Type: IV induction Ventilation: Mask ventilation without difficulty LMA: LMA inserted LMA Size: 4.0 Number of attempts: 1 Placement Confirmation: positive ETCO2 and breath sounds checked- equal and bilateral

## 2019-06-25 NOTE — Op Note (Signed)
Patient:  Amber Williams  DOB:  Dec 30, 1975  MRN:  [id number]   Preop Diagnosis: Anal cancer  Postop Diagnosis: Same  Procedure: Flexible sigmoidoscopy  Surgeon: Aviva Signs, MD  Anes: General  Indications: Patient is a 44 year old white female recently diagnosed with anal carcinoma who was undergone chemo and radiation therapy who now presents for follow-up flexible sigmoidoscopy and possible anoscopy due to an abnormality seen on the PET scan.  The risks and benefits of the procedure including the possibility of a biopsy were fully explained to the patient, who gave informed consent.  Procedure note: The patient was placed in the lithotomy position after general anesthesia was administered.  The endoscope was advanced to the 35 cm Lyncoln Maskell without difficulty.  This was approximately the sigmoid colon.  No abnormal lesions were noted in the sigmoid colon or rectum.  On retroflexion of the scope, the dentate line was inspected and no abnormal lesions noted.  Scarred healed tissue was noted at the anal verge.  On digital palpation of the anterior rectum, I could not feel any masses.  The air was evacuated from the colon or rectum.  I did not find evidence of anal cancer at this time.  The patient was awakened and transferred to PACU in stable condition.  Complications: None  EBL: None  Specimen: None

## 2019-06-30 ENCOUNTER — Other Ambulatory Visit: Payer: Self-pay

## 2019-06-30 DIAGNOSIS — G35 Multiple sclerosis: Secondary | ICD-10-CM

## 2019-07-02 ENCOUNTER — Telehealth: Payer: Self-pay

## 2019-07-02 NOTE — Telephone Encounter (Signed)
07/02/2019 Called to follow-up with patient about [place] and the [place].  Left message on voicemail for patient to return my call. Ambrose Mantle [phone]

## 2019-07-09 ENCOUNTER — Other Ambulatory Visit: Payer: Self-pay | Admitting: *Deleted

## 2019-07-09 DIAGNOSIS — G4701 Insomnia due to medical condition: Secondary | ICD-10-CM

## 2019-07-09 MED ORDER — TRAZODONE HCL 150 MG PO TABS: ORAL_TABLET | ORAL | 1 refills | Status: DC

## 2019-07-09 MED ORDER — AMITRIPTYLINE HCL 50 MG PO TABS: 50.0000 mg | ORAL_TABLET | Freq: Every day | ORAL | 3 refills | Status: DC

## 2019-07-09 MED ORDER — LEVONORGESTREL-ETHINYL ESTRAD 0.15-30 MG-MCG PO TABS: 1.0000 | ORAL_TABLET | Freq: Every day | ORAL | 11 refills | Status: DC

## 2019-07-09 MED ORDER — LEVOTHYROXINE SODIUM 125 MCG PO TABS: ORAL_TABLET | ORAL | 0 refills | Status: DC

## 2019-07-09 MED ORDER — OMEPRAZOLE 20 MG PO CPDR: 20.0000 mg | DELAYED_RELEASE_CAPSULE | Freq: Every day | ORAL | 0 refills | Status: DC

## 2019-07-09 NOTE — Telephone Encounter (Signed)
Fax from [facility] Request for new Rx of Trazodone & Amitriptyline to mail order pharmacy

## 2019-07-10 ENCOUNTER — Inpatient Hospital Stay (HOSPITAL_COMMUNITY): Payer: Medicare HMO

## 2019-07-10 ENCOUNTER — Encounter (HOSPITAL_COMMUNITY): Payer: Self-pay | Admitting: Genetic Counselor

## 2019-07-10 ENCOUNTER — Inpatient Hospital Stay (HOSPITAL_COMMUNITY): Payer: Medicare HMO | Attending: Genetic Counselor | Admitting: Genetic Counselor

## 2019-07-10 ENCOUNTER — Other Ambulatory Visit: Payer: Self-pay

## 2019-07-10 DIAGNOSIS — Z803 Family history of malignant neoplasm of breast: Secondary | ICD-10-CM | POA: Diagnosis not present

## 2019-07-10 DIAGNOSIS — C210 Malignant neoplasm of anus, unspecified: Secondary | ICD-10-CM

## 2019-07-10 NOTE — Progress Notes (Signed)
REFERRING PROVIDER: [name], MD [place],  [place] [address]  PRIMARY PROVIDER:  [name], MD  PRIMARY REASON FOR VISIT:  1. Anal cancer ([place])   2. Family history of breast cancer      HISTORY OF PRESENT ILLNESS:   Ms. Kretz, a 45 y.o. female, was seen for a [place] cancer genetics consultation at the request of Dr. Delton Coombes due to a personal and family history of cancer.  Ms. Herringshaw presents to clinic today to discuss the possibility of a hereditary predisposition to cancer, genetic testing, and to further clarify her future cancer risks, as well as potential cancer risks for family members.   In August 2020, at the age of 77, Ms. Mcinnis was diagnosed with anal cancer. The treatment plan chemotherapy and radiation.   CANCER HISTORY:  Oncology History  Anal cancer ([place])  01/10/2019 Initial Diagnosis   Anal cancer ([place])   01/16/2019 Cancer Staging   Staging form: Anus, AJCC 8th Edition - Clinical: Stage Unknown (cTX, cN0, cM0) - Signed by Derek Jack, MD on 01/16/2019   02/03/2019 -  Chemotherapy   The patient had palonosetron (ALOXI) injection 0.25 mg, 0.25 mg, Intravenous,  Once, 1 of 1 cycle Administration: 0.25 mg (02/03/2019), 0.25 mg (03/03/2019) mitoMYcin (MUTAMYCIN) chemo injection 20 mg, 10.1 mg/m2 = 19.5 mg, Intravenous,  Once, 1 of 1 cycle Administration: 20 mg (02/03/2019), 20 mg (03/03/2019) fluorouracil (ADRUCIL) 7,900 mg in sodium chloride 0.9 % 92 mL chemo infusion, 1,000 mg/m2/day = 7,900 mg, Intravenous, 4D (96 hours ), 1 of 1 cycle Administration: 7,900 mg (02/03/2019), 7,900 mg (03/03/2019)  for chemotherapy treatment.       RISK FACTORS:  Menarche was at age 27.  First live birth at age N/A.  OCP use for approximately 10+ years.  Ovaries intact: yes.  Hysterectomy: no.  Menopausal status: premenopausal.  HRT use: 0 years. Colonoscopy: no; not examined. Mammogram within the last year: yes. Number of breast  biopsies: 0. Up to date with pelvic exams: yes. Any excessive radiation exposure in the past: no  Past Medical History:  Diagnosis Date  . Allergy   . Anal cancer ([place]) 01/10/2019  . Arthritis   . Blood transfusion without reported diagnosis    2013  . Depression   . DVT (deep venous thrombosis) ([place]) 2013  . Family history of breast cancer   . Gait abnormality 08/03/2016  . Hyperlipidemia   . Hypertension   . Hypothyroidism   . Multiple sclerosis ([place])   . Neurogenic bladder 08/03/2016  . Neuromuscular disorder ([place])    dx'd with MS at age of 59  . Port-A-Cath in place 01/23/2019  . PTSD (post-traumatic stress disorder)   . Right foot drop 08/03/2016  . Thyroid disease   . Ulcer     Past Surgical History:  Procedure Laterality Date  . COLON SURGERY     Colonoscopy  . FLEXIBLE SIGMOIDOSCOPY N/A 06/25/2019   Procedure: FLEXIBLE SIGMOIDOSCOPY ANOSCOPY;  Surgeon: Aviva Signs, MD;  Location: [facility];  Service: General;  Laterality: N/A;  . GASTRIC BYPASS  2010  . HEMORRHOID SURGERY N/A 12/25/2018   Procedure: EXTENSIVE HEMORRHOIDECTOMY;  Surgeon: Aviva Signs, MD;  Location: [facility];  Service: General;  Laterality: N/A;  . INTERSTIM IMPLANT PLACEMENT  2014  . PORTACATH PLACEMENT      Social History   Socioeconomic History  . Marital status: Single    Spouse name: Ricky  . Number of children: 0  . Years of  education: 14  . Highest education level: Associate degree: occupational, Hotel manager, or vocational program  Occupational History  . Occupation: Disability  Tobacco Use  . Smoking status: Former Smoker    Packs/day: 1.50    Years: 15.00    Pack years: 22.50    Types: Cigarettes    Start date: 45    Quit date: 01/30/2009    Years since quitting: 10.4  . Smokeless tobacco: Never Used  Substance and Sexual Activity  . 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 Drug use: No  . Sexual activity: Yes  Other Topics Concern  . Not on file  Social History  Narrative   Lives at home with her boyfriend   Right-handed   Caffeine: 2 glasses per day   One story home   Social Determinants of Health   Financial Resource Strain:   . Difficulty of Paying Living Expenses:   Food Insecurity:   . Worried About Charity fundraiser in the Last Year:   . Arboriculturist in the Last Year:   Transportation Needs:   . Film/video editor (Medical):   [name] Lack of Transportation (Non-Medical):   Physical Activity:   . Days of Exercise per Week:   . Minutes of Exercise per Session:   Stress:   . Feeling of Stress :   Social Connections: Unknown  . Frequency of Communication with Friends and Family: More than three times a week  . Frequency of Social Gatherings with Friends and Family: More than three times a week  . Attends Religious Services: Not on file  . Active Member of Clubs or Organizations: Not on file  . Attends Archivist Meetings: Not on file  . Marital Status: Living with partner     FAMILY HISTORY:  We obtained a detailed, 4-generation family history.  Significant diagnoses are listed below: Family History  Problem Relation Age of Onset  . Breast cancer Mother 70  . Hypertension Father   . Diabetes Father   . Hypertension Sister   . Miscarriages / Stillbirths Sister   . Breast cancer Sister 37  . Heart disease Maternal Grandmother   . Diabetes Maternal Grandmother   . Heart disease Maternal Grandfather   . Diabetes Paternal Grandmother   . Cancer Paternal Grandmother        unknown form of cancer  . Heart disease Paternal Grandfather   . Stroke Paternal Grandfather     The patient does not have children.  She has a sister who was diagnosed at age 56 with breast cancer.  Reportedly, genetic testing was negative.  Both parents are living.    The patient's mother was diagnosed with breast cancer at age 50.  She has a brother and sister who are cancer free.  Her mother is living and father died of a heart  attack.  The patient's father is living.  He had two brothers who have died young, one from diabetes and one from a heart attack.  His mother had some form of cancer.  Ms. Rossa is aware of previous family history of genetic testing for hereditary cancer risks. Patient's maternal ancestors are of Caucasian descent, and paternal ancestors are of [name] descent. There is no reported Ashkenazi Jewish ancestry. There is no known consanguinity.    GENETIC COUNSELING ASSESSMENT: Ms. Laureano is a 44 y.o. female with a personal and family history of cancer which is somewhat suggestive of a hereditary cancer  syndrome and predisposition to cancer given the young age of onset of her sister, however her sister reportedly had a negative genetic test. We, therefore, discussed and recommended the following at today's visit.   DISCUSSION: We discussed that 5 - 10% of breast cancer is hereditary, with most cases associated with BRCA mutations.  There are other genes that can be associated with hereditary breast cancer syndromes.  These include ATM, CHEK2 and PALB2.  Based on her sister's negative genetic testing, the patient's risk for a hereditary cancer syndrome is low.  However, we discussed that testing is beneficial for several reasons including knowing how to follow individuals after completing their treatment, identifying whether potential treatment options such as PARP inhibitors would be beneficial, and understand if other family members could be at risk for cancer and allow them to undergo genetic testing.   Anal cancer is not typically a hereditary cancer.  It is not part of colorectal cancer syndromes, and therefore we would not recommend genetic testing based on her young age of anal cancer.  We reviewed the characteristics, features and inheritance patterns of hereditary cancer syndromes. We also discussed genetic testing, including the appropriate family members to test, the process of testing,  insurance coverage and turn-around-time for results. We discussed the implications of a negative, positive, carrier and/or variant of uncertain significant result. We recommended Ms. Saur pursue genetic testing for the common hereditary cancer gene panel. The Common Hereditary Gene Panel offered by [facility] includes sequencing and/or deletion duplication testing of the following 48 genes: APC, ATM, AXIN2, BARD1, BMPR1A, BRCA1, BRCA2, BRIP1, CDH1, CDK4, CDKN2A (p14ARF), CDKN2A (p16INK4a), CHEK2, CTNNA1, DICER1, EPCAM (Deletion/duplication testing only), GREM1 (promoter region deletion/duplication testing only), KIT, MEN1, MLH1, MSH2, MSH3, MSH6, MUTYH, NBN, NF1, NHTL1, PALB2, PDGFRA, PMS2, POLD1, POLE, PTEN, RAD50, RAD51C, RAD51D, RNF43, SDHB, SDHC, SDHD, SMAD4, SMARCA4. STK11, TP53, TSC1, TSC2, and VHL.  The following genes were evaluated for sequence changes only: SDHA and HOXB13 c.251G>A variant only.   Based on Ms. Divirgilio personal and family history of cancer, she meets medical criteria for genetic testing. Despite that she meets criteria, she may still have an out of pocket cost. We discussed that if her out of pocket cost for testing is over $100, the laboratory will call and confirm whether she wants to proceed with testing.  If the out of pocket cost of testing is less than $100 she will be billed by the genetic testing laboratory.   Based on the patient's personal and family history, a statistical model (Tyrer Cusik) was used to estimate her risk of developing breast cancer. This estimates her lifetime risk of developing breast cancer to be approximately 27.8%. This estimation does not consider any genetic testing results.  The patient's lifetime breast cancer risk is a preliminary estimate based on available information using one of several models endorsed by the [place] (ACS). The ACS recommends consideration of breast MRI screening as an adjunct to mammography for patients at  high risk (defined as 20% or greater lifetime risk).   Ms. Bednarz has been determined to be at high risk for breast cancer.  Therefore, we recommend that annual screening with mammography and breast MRI be performed. We discussed that Ms. Vahey should discuss her individual situation with her referring physician and determine a breast cancer screening plan with which they are both comfortable.      PLAN: After considering the risks, benefits, and limitations, Ms. Circle provided informed consent to pursue genetic testing and the  blood sample was sent to [facility] for analysis of the common hereditary cancer syndrome. Results should be available within approximately 2-3 weeks' time, at which point they will be disclosed by telephone to Ms. Vilchis, as will any additional recommendations warranted by these results. Ms. Moskowitz will receive a summary of her genetic counseling visit and a copy of her results once available. This information will also be available in Epic.   Lastly, we encouraged Ms. Rabold to remain in contact with cancer genetics annually so that we can continuously update the family history and inform her of any changes in cancer genetics and testing that may be of benefit for this family.   Ms. Brunke questions were answered to her satisfaction today. Our contact information was provided should additional questions or concerns arise. Thank you for the referral and allowing [name] to share in the care of your patient.   Sheccid Lahmann P. Florene Glen, [place], [facility] Licensed, Insurance risk surveyor [email or web address] phone: [phone]  The patient was seen for a total of 45 minutes in face-to-face genetic counseling.  This patient was discussed with Drs. Magrinat, Lindi Adie and/or Burr Medico who agrees with the above.    _______________________________________________________________________ For Office Staff:  Number of people involved in session: 2 Was an Intern/  student involved with case: no

## 2019-07-14 ENCOUNTER — Other Ambulatory Visit: Payer: Self-pay | Admitting: *Deleted

## 2019-07-14 MED ORDER — ESCITALOPRAM OXALATE 10 MG PO TABS: 10.0000 mg | ORAL_TABLET | Freq: Every day | ORAL | 0 refills | Status: DC

## 2019-07-15 ENCOUNTER — Telehealth: Payer: Self-pay

## 2019-07-15 ENCOUNTER — Ambulatory Visit (INDEPENDENT_AMBULATORY_CARE_PROVIDER_SITE_OTHER): Payer: Medicare HMO | Admitting: Licensed Clinical Social Worker

## 2019-07-15 DIAGNOSIS — F331 Major depressive disorder, recurrent, moderate: Secondary | ICD-10-CM | POA: Diagnosis not present

## 2019-07-15 DIAGNOSIS — E039 Hypothyroidism, unspecified: Secondary | ICD-10-CM | POA: Diagnosis not present

## 2019-07-15 DIAGNOSIS — G35 Multiple sclerosis: Secondary | ICD-10-CM

## 2019-07-15 DIAGNOSIS — F4310 Post-traumatic stress disorder, unspecified: Secondary | ICD-10-CM

## 2019-07-15 NOTE — Chronic Care Management (AMB) (Signed)
  Care Management Note   Amber Williams is a 44 y.o. year old female who is a primary care patient of [name], [name], MD. The CM team was consulted for assistance with chronic disease management and care coordination.   I reached out to [name] by phone today.   Review of patient status, including review of consultants reports, relevant laboratory and other test results, and collaboration with appropriate care team members and the patient's provider was performed as part of comprehensive patient evaluation and provision of chronic care management services.   Social determinants of health: risk of tobacco use; risk of depression    Office Visit from 05/20/2019 in [facility]  PHQ-9 Total Score  1     GAD 7 : Generalized Anxiety Score 04/01/2019  Nervous, Anxious, on Edge 1  Control/stop worrying 0  Worry too much - different things 0  Trouble relaxing 1  Restless 0  Easily annoyed or irritable 1  Afraid - awful might happen 0  Total GAD 7 Score 3  Anxiety Difficulty Somewhat difficult   Medications   (very important)  New medications from outside sources are available for reconciliation  ALLERGY RELIEF 180 MG tablet amitriptyline (ELAVIL) 50 MG tablet Cholecalciferol (VITAMIN D3) 5000 units CAPS escitalopram (LEXAPRO) 10 MG tablet flurazepam (DALMANE) 30 MG capsule levonorgestrel-ethinyl estradiol (NORDETTE) 0.15-30 MG-MCG tablet levothyroxine (SYNTHROID) 125 MCG tablet loperamide (IMODIUM) 1 MG/5ML solution mometasone (NASONEX) 50 MCG/ACT nasal spray Ocrelizumab (OCREVUS IV) omeprazole (PRILOSEC) 20 MG capsule traZODone (DESYREL) 150 MG tablet vitamin B-12 (CYANOCOBALAMIN) 500 MCG tablet  Goals        . Client will talk with LCSW in next 3 weeks to discuss stress/anxiety management for client (pt-stated)     Current Barriers:  . Client has MS (multiple sclerosis) and has Chronic Diagnoses also of Depression, PTSD, and  Hypothyroidism . Client has decreased energy . Decreased appetite  Clinical Social Work Clinical Goal(s):  [name] LCSW will talk with client in next 3 weeks about stress/anxiety management for client  Interventions:  Previously talked with client about pain issues of client  Previously talked with client about CCM program services  Encouraged client previously to talk with RNCM to discuss nursing needs of client  Talked with Articia previously about her use of food stamps benefit  Previously talked with Larra previously about health needs of her sister  Talked with Libby previously about health needs of her mother  Previously talked with client about ambulation challenges (she is using cane to help her walk)  Previously talked with client about financial needs of client  Patient Self Care Activities:   Attends classes, as able a, at [facility] medications as prescribed.  Plan:  Client to attend scheduled client medical appointments Client to communicate with RNCM to discuss nursing needs of client LCSW to call client in next 4 weeks to talk with her about managing anxiety and stress issues of client  Initial goal documentation       Follow Up Plan: LCSW to call client in next 4 weeks to talk with client about her management of anxiety and stress issues of client  [name].Chardonnay Holzmann MSW, LCSW Licensed Clinical Social Worker [facility] Care Management [phone]

## 2019-07-15 NOTE — Patient Instructions (Addendum)
Licensed Clinical Social Worker Visit Information  Goals we discussed today:  Goals        . Client will talk with LCSW in next 3 weeks to discuss stress/anxiety management for client (pt-stated)     Current Barriers:  . Client has MS (multiple sclerosis) and has Chronic Diagnoses also of Depression, PTSD, and Hypothyroidism . Client has decreased energy . Decreased appetite   Clinical Social Work Clinical Goal(s):  [name] LCSW will talk with client in next 3 weeks about stress/anxiety management for client  Interventions: Previously talked with client about pain issues of client  Previously talked with client about CCM program services  Encouraged client previously to talk with RNCM to discuss nursing needs of client  Talked with Zya previously about her use of food stamps benefit  Previously talked with Lilias previously about health needs of her sister  Talked with Daleena previously about health needs of her mother  Previously talked with client about ambulation challenges (she is using cane to help her walk)  Previously talked with client about financial needs of client  Patient Self Care Activities:   Attends classes, as able a, at [facility] medications as prescribed.  Plan:  Client to attend scheduled client medical appointments Client to communicate with RNCM to discuss nursing needs of client LCSW to call client in next 4 weeks to talk with her about managing anxiety and stress issues of client  Initial goal documentation        Materials Provided: No  Follow Up Plan: LCSW to call client in next 4 weeks to talk with her about her management of anxiety and stress issues of client  The patient verbalized understanding of instructions provided today and declined a print copy of patient instruction materials.   [name].Nizar Cutler MSW, LCSW Licensed Clinical Social Worker [facility] Care  Management [phone]

## 2019-07-15 NOTE — Telephone Encounter (Signed)
07/15/2019 Follow-up. Left message for patient to return my call regarding medical alert system. Ambrose Mantle [phone]

## 2019-07-16 ENCOUNTER — Telehealth: Payer: Self-pay

## 2019-07-16 NOTE — Telephone Encounter (Signed)
07/16/2019 Spoke with patient about medical alert system. Patient is filing for Medicaid which would cover this and other medical costs.  Her Medicaid case worker is still processing her paperwork.  Patient stated she might look into getting a medical alert system later but right now she has to much going on.  Patient will call if she has any other needs.  Closing referral. Ambrose Mantle [phone]

## 2019-07-28 ENCOUNTER — Other Ambulatory Visit: Payer: Self-pay | Admitting: *Deleted

## 2019-07-28 ENCOUNTER — Telehealth: Payer: Medicare HMO

## 2019-07-28 ENCOUNTER — Encounter: Payer: Self-pay | Admitting: Family Medicine

## 2019-07-28 DIAGNOSIS — Z803 Family history of malignant neoplasm of breast: Secondary | ICD-10-CM

## 2019-07-29 ENCOUNTER — Telehealth: Payer: Self-pay | Admitting: Neurology

## 2019-07-29 NOTE — Telephone Encounter (Signed)
Left message for patient to call office to follow up on her call.

## 2019-07-29 NOTE — Telephone Encounter (Signed)
Patient is requesting solumedrol  No longer doing Ocrevus due to cancer.  This did help patient but she needs a new order now that she has switched to [facility].  Informed patient Dr Tomi Likens is out of the office and that she may have to wait for determination.  Patient understands.

## 2019-07-29 NOTE — Telephone Encounter (Signed)
Patient needs to speak to someone about what is going on with her MS she has appt already sch for some time in Aug. Dr Tomi Likens next open slot is in July. Please call patient

## 2019-07-29 NOTE — Telephone Encounter (Signed)
Pt returned call states her phone accidentally was on silent. Confirmed pt phone number on chart is correct. Please call pt.

## 2019-07-30 NOTE — Telephone Encounter (Signed)
Please let patient know that Dr. Tomi Likens is out of the office and I am not sure how to proceed given her complicated case.  Is her oncologist okay with solumedrol if Dr. Tomi Likens decides to proceed.  Hinton Dyer, if any cancellations come up, please hold for this patient.

## 2019-07-30 NOTE — Telephone Encounter (Signed)
Amber Williams,  I called and spoke to Amber Williams.  I am reaching out to her oncologist to see if there is any objection to giving her Solu-Medrol 1000mg  daily for 5 days.  If there is no objection, we will order this for her.  She typically has it at home via [facility].  I will let you know.  In the meantime, given her complex medical history, I would like to refer her to the [place] clinic at [facility] as I believe she would do best under the care and supervision of an MS specialist.  I will let you know once I hear back from her oncologist

## 2019-07-30 NOTE — Telephone Encounter (Signed)
I will keep a eye on for anything that comes open

## 2019-07-31 NOTE — Telephone Encounter (Signed)
In process with [facility]. Waiting on response.

## 2019-07-31 NOTE — Telephone Encounter (Signed)
Amber Williams,  This patient is reporting a multiple sclerosis flare up.  I have heard back from her oncologist and there is no objection to giving Solu-Medrol.  I would like to order Solu-Medrol 1000mg  daily for 5 days.  In the past, she had this as a home infusion via [facility] (she has a port).    Thank you

## 2019-08-04 ENCOUNTER — Telehealth: Payer: Self-pay | Admitting: Neurology

## 2019-08-04 ENCOUNTER — Other Ambulatory Visit: Payer: Self-pay

## 2019-08-04 ENCOUNTER — Ambulatory Visit: Payer: Medicare HMO | Admitting: Neurology

## 2019-08-04 ENCOUNTER — Telehealth: Payer: Self-pay | Admitting: Adult Health

## 2019-08-04 DIAGNOSIS — G35 Multiple sclerosis: Secondary | ICD-10-CM

## 2019-08-04 NOTE — Telephone Encounter (Signed)
Carolynn Sayers from [facility] called and left a message over the holiday weekend requesting a call back about orders for this patient's IV solu-medrol medication.

## 2019-08-04 NOTE — Telephone Encounter (Signed)
Order placed for solumedrol 1000MG  x 5 days, faxed to [facility]

## 2019-08-04 NOTE — Telephone Encounter (Signed)
Patient came in today with [employer] paperwork. Patient paid 50.00 form fee.

## 2019-08-05 ENCOUNTER — Telehealth: Payer: Self-pay | Admitting: *Deleted

## 2019-08-05 NOTE — Telephone Encounter (Signed)
Pt [employer] form on [facility] [address] desk.

## 2019-08-07 ENCOUNTER — Ambulatory Visit (INDEPENDENT_AMBULATORY_CARE_PROVIDER_SITE_OTHER): Payer: Medicare HMO | Admitting: Family Medicine

## 2019-08-07 ENCOUNTER — Other Ambulatory Visit: Payer: Self-pay

## 2019-08-07 ENCOUNTER — Encounter: Payer: Self-pay | Admitting: Family Medicine

## 2019-08-07 VITALS — BP 132/65 | HR 101 | Temp 98.4°F | Ht 65.0 in | Wt 177.8 lb

## 2019-08-07 DIAGNOSIS — F1320 Sedative, hypnotic or anxiolytic dependence, uncomplicated: Secondary | ICD-10-CM

## 2019-08-07 DIAGNOSIS — G35 Multiple sclerosis: Secondary | ICD-10-CM

## 2019-08-07 DIAGNOSIS — E039 Hypothyroidism, unspecified: Secondary | ICD-10-CM

## 2019-08-07 DIAGNOSIS — C210 Malignant neoplasm of anus, unspecified: Secondary | ICD-10-CM

## 2019-08-07 DIAGNOSIS — R6889 Other general symptoms and signs: Secondary | ICD-10-CM

## 2019-08-07 DIAGNOSIS — G4701 Insomnia due to medical condition: Secondary | ICD-10-CM

## 2019-08-07 MED ORDER — FLURAZEPAM HCL 30 MG PO CAPS: 30.0000 mg | ORAL_CAPSULE | Freq: Every day | ORAL | 1 refills | Status: DC

## 2019-08-07 NOTE — Progress Notes (Signed)
Subjective:  Patient ID: Amber Williams, female    DOB: 1976/04/11  Age: 44 y.o. MRN: [id number]  CC: Cold Extremity ( whole body- 2 years), Multiple Sclerosis (Worsened with chemo.  Now affecting left side), and Bleeding/Bruising   HPI Amber Williams presents for feels like it is coming from the inside. Ambient temperature doesn't affect it. She is cold even when temp is warm. She hasn't used ocrevus in a year and feels her MS is worsening. She can't use it until her chemo has been done for 3-4 months. Amber Williams is nearly to that point, but is searching for a new neurologist. Her previous neuro would not treat her as she was to complicated. The one before that left her in pain frequently without offering any treatment options. As a result of her MS, she has had tremendous problems with sleep. She has become dependent on dalmane and trazodone after multiple failed trails of non-benzodiazepines.   She has a history of stable underactive thyroid dx. Asymptomatic other than the cold intolerance. She also has a history of anemia as a possible cause of feeling cold.   She recently finished radiation and chemo for anal cancer. Awaiting follow up to determine if in remission.    Depression screen [facility] 2/9 08/07/2019 05/20/2019 04/01/2019  Decreased Interest 0 0 1  Down, Depressed, Hopeless 0 0 1  PHQ - 2 Score 0 0 2  Altered sleeping - 1 0  Tired, decreased energy - 0 2  Change in appetite - 0 0  Feeling bad or failure about yourself  - 0 0  Trouble concentrating - 0 0  Moving slowly or fidgety/restless - 0 1  Suicidal thoughts - 0 0  PHQ-9 Score - 1 5  Difficult doing work/chores - - Somewhat difficult  Some recent data might be hidden    History Amber Williams has a past medical history of Allergy, Anal cancer ([place]) (01/10/2019), Arthritis, Blood transfusion without reported diagnosis, Depression, DVT (deep venous thrombosis) ([place]) (2013), Family history of breast cancer, Gait abnormality (08/03/2016),  Hyperlipidemia, Hypertension, Hypothyroidism, Multiple sclerosis ([place]), Neurogenic bladder (08/03/2016), Neuromuscular disorder ([place]), Port-A-Cath in place (01/23/2019), PTSD (post-traumatic stress disorder), Right foot drop (08/03/2016), Thyroid disease, and Ulcer.   She has a past surgical history that includes Colon surgery; Gastric bypass (2010); Interstim Implant placement (2014); Portacath placement; Hemorrhoid surgery (N/A, 12/25/2018); and Flexible sigmoidoscopy (N/A, 06/25/2019).   Her family history includes Breast cancer (age of onset: 31) in her sister; Breast cancer (age of onset: 2) in her mother; Cancer in her paternal grandmother; Diabetes in her father, maternal grandmother, and paternal grandmother; Heart disease in her maternal grandfather, maternal grandmother, and paternal grandfather; Hypertension in her father and sister; Miscarriages / Stillbirths in her sister; Stroke in her paternal grandfather.She reports that she quit smoking about 10 years ago. Her smoking use included cigarettes. She started smoking about 28 years ago. She has a 22.50 pack-year smoking history. She has never used smokeless tobacco. She reports previous alcohol use. She reports that she does not use drugs.    ROS Review of Systems  Constitutional: Negative.   HENT: Negative.   Eyes: Negative for visual disturbance.  Respiratory: Negative for shortness of breath.   Cardiovascular: Negative for chest pain.  Gastrointestinal: Negative for abdominal pain.  Endocrine: Positive for cold intolerance. Negative for polydipsia, polyphagia and polyuria.  Musculoskeletal: Negative for arthralgias.    Objective:  BP 132/65    Pulse (!) 101    Temp 98.4  F (36.9 C) (Temporal)    Ht 5' 5"  (1.651 m)    Wt 177 lb 12.8 oz (80.6 kg)    BMI 29.59 kg/m   BP Readings from Last 3 Encounters:  08/07/19 132/65  06/25/19 126/70  06/24/19 120/79    Wt Readings from Last 3 Encounters:  08/07/19 177 lb 12.8 oz (80.6 kg)    06/24/19 188 lb (85.3 kg)  06/23/19 180 lb (81.6 kg)     Physical Exam Constitutional:      General: She is not in acute distress.    Appearance: She is well-developed.  Cardiovascular:     Rate and Rhythm: Normal rate and regular rhythm.  Pulmonary:     Breath sounds: Normal breath sounds.  Musculoskeletal:     Comments: Uses a walker for ambulation, gait is slow, wide based, antalgic  Skin:    General: Skin is warm and dry.  Neurological:     Mental Status: She is alert and oriented to person, place, and time.       Assessment & Plan:   [place] was seen today for cold extremity, multiple sclerosis and bleeding/bruising.  Diagnoses and all orders for this visit:  Multiple sclerosis ([place]) -     CBC with Differential/Platelet -     CMP14+EGFR -     TSH + free T4 -     ToxASSURE Select 13 (MW), Urine  Insomnia due to medical condition -     CBC with Differential/Platelet -     CMP14+EGFR -     TSH + free T4 -     ToxASSURE Select 13 (MW), Urine  Hypothyroidism, unspecified type -     CBC with Differential/Platelet -     CMP14+EGFR  Anal cancer (HCC) -     CBC with Differential/Platelet -     CMP14+EGFR  Cold intolerance -     CBC with Differential/Platelet -     CMP14+EGFR -     TSH + free T4  Benzodiazepine dependence, continuous (HCC) -     CBC with Differential/Platelet -     CMP14+EGFR -     ToxASSURE Select 13 (MW), Urine  Other orders -     flurazepam (DALMANE) 30 MG capsule; Take 1 capsule (30 mg total) by mouth at bedtime.       I have changed Amber Williams's flurazepam. [name] Vitamin D3, Ocrelizumab (OCREVUS IV), vitamin B-12, Allergy Relief, mometasone, loperamide, amitriptyline, traZODone, levonorgestrel-ethinyl estradiol, levothyroxine, omeprazole, and escitalopram.  Allergies as of 08/07/2019      Reactions   Other Hives   Plastic Tape   Tecfidera [dimethyl Fumarate] Itching      Medication List        Accurate as of August 07, 2019 11:59 PM. If you have any questions, ask your nurse or doctor.        Allergy Relief 180 MG tablet Generic drug: fexofenadine Take 180 mg by mouth daily as needed for allergies.   amitriptyline 50 MG tablet Commonly known as: ELAVIL Take 1 tablet (50 mg total) by mouth at bedtime.   escitalopram 10 MG tablet Commonly known as: LEXAPRO Take 1 tablet (10 mg total) by mouth daily.   flurazepam 30 MG capsule Commonly known as: DALMANE Take 1 capsule (30 mg total) by mouth at bedtime.   levonorgestrel-ethinyl estradiol 0.15-30 MG-MCG tablet Commonly known as: NORDETTE Take 1 tablet by mouth daily.   levothyroxine 125 MCG tablet Commonly known as:  SYNTHROID TAKE 1 TABLET BY MOUTH DAILY BEFORE BREAKFAST   loperamide 1 MG/5ML solution Commonly known as: IMODIUM Take 6 mg by mouth as needed for diarrhea or loose stools.   mometasone 50 MCG/ACT nasal spray Commonly known as: NASONEX Place 2 sprays into the nose 2 (two) times daily as needed (allergies).   OCREVUS IV Inject into the vein every 6 (six) months.   omeprazole 20 MG capsule Commonly known as: PRILOSEC Take 1 capsule (20 mg total) by mouth daily.   traZODone 150 MG tablet Commonly known as: DESYREL Use from 1/3 to 1 tablet nightly as needed for sleep.   vitamin B-12 500 MCG tablet Commonly known as: CYANOCOBALAMIN Take 500 mcg by mouth daily.   Vitamin D3 125 MCG (5000 UT) Caps Take 5,000 Units by mouth every morning.        Follow-up: No follow-ups on file.  [name], M.D.

## 2019-08-08 LAB — CBC WITH DIFFERENTIAL/PLATELET
Basophils Absolute: 0 10*3/uL (ref 0.0–0.2)
Basos: 0 %
EOS (ABSOLUTE): 0 10*3/uL (ref 0.0–0.4)
Eos: 0 %
Hematocrit: 42.7 % (ref 34.0–46.6)
Hemoglobin: 14.1 g/dL (ref 11.1–15.9)
Immature Grans (Abs): 0 10*3/uL (ref 0.0–0.1)
Immature Granulocytes: 0 %
Lymphocytes Absolute: 0.2 10*3/uL — ABNORMAL LOW (ref 0.7–3.1)
Lymphs: 2 %
MCH: 31.7 pg (ref 26.6–33.0)
MCHC: 33 g/dL (ref 31.5–35.7)
MCV: 96 fL (ref 79–97)
Monocytes Absolute: 0.1 10*3/uL (ref 0.1–0.9)
Monocytes: 1 %
Neutrophils Absolute: 10.5 10*3/uL — ABNORMAL HIGH (ref 1.4–7.0)
Neutrophils: 97 %
Platelets: 250 10*3/uL (ref 150–450)
RBC: 4.45 x10E6/uL (ref 3.77–5.28)
RDW: 12.5 % (ref 11.7–15.4)
WBC: 10.8 10*3/uL (ref 3.4–10.8)

## 2019-08-08 LAB — CMP14+EGFR
ALT: 14 IU/L (ref 0–32)
AST: 11 IU/L (ref 0–40)
Albumin/Globulin Ratio: 3.4 — ABNORMAL HIGH (ref 1.2–2.2)
Albumin: 4.4 g/dL (ref 3.8–4.8)
Alkaline Phosphatase: 77 IU/L (ref 39–117)
BUN/Creatinine Ratio: 17 (ref 9–23)
BUN: 14 mg/dL (ref 6–24)
Bilirubin Total: 0.3 mg/dL (ref 0.0–1.2)
CO2: 20 mmol/L (ref 20–29)
Calcium: 10.2 mg/dL (ref 8.7–10.2)
Chloride: 105 mmol/L (ref 96–106)
Creatinine, Ser: 0.84 mg/dL (ref 0.57–1.00)
GFR calc Af Amer: 98 mL/min/{1.73_m2} (ref >59)
GFR calc non Af Amer: 85 mL/min/{1.73_m2} (ref >59)
Globulin, Total: 1.3 g/dL — ABNORMAL LOW (ref 1.5–4.5)
Glucose: 115 mg/dL — ABNORMAL HIGH (ref 65–99)
Potassium: 4 mmol/L (ref 3.5–5.2)
Sodium: 143 mmol/L (ref 134–144)
Total Protein: 5.7 g/dL — ABNORMAL LOW (ref 6.0–8.5)

## 2019-08-08 LAB — TSH+FREE T4
Free T4: 1.6 ng/dL (ref 0.82–1.77)
TSH: 0.049 u[IU]/mL — ABNORMAL LOW (ref 0.450–4.500)

## 2019-08-09 LAB — TOXASSURE SELECT 13 (MW), URINE

## 2019-08-10 ENCOUNTER — Encounter: Payer: Self-pay | Admitting: Family Medicine

## 2019-08-11 ENCOUNTER — Ambulatory Visit: Payer: Self-pay | Admitting: Genetic Counselor

## 2019-08-11 ENCOUNTER — Telehealth: Payer: Self-pay | Admitting: Neurology

## 2019-08-11 ENCOUNTER — Telehealth: Payer: Self-pay | Admitting: *Deleted

## 2019-08-11 ENCOUNTER — Telehealth: Payer: Self-pay | Admitting: Genetic Counselor

## 2019-08-11 ENCOUNTER — Encounter: Payer: Self-pay | Admitting: Genetic Counselor

## 2019-08-11 DIAGNOSIS — Z1379 Encounter for other screening for genetic and chromosomal anomalies: Secondary | ICD-10-CM | POA: Insufficient documentation

## 2019-08-11 NOTE — Telephone Encounter (Signed)
Verbal order for Cathflo 2mg  given to  [facility].  Per Stanton Kidney: Script will be  Faxed over once she writes it up and faxed back signed  by Dr. Tomi Likens.

## 2019-08-11 NOTE — Progress Notes (Signed)
HPI:  [name] was previously seen in the [place] clinic due to a personal and family history of cancer and concerns regarding a hereditary predisposition to cancer. Please refer to our prior cancer genetics clinic note for more information regarding our discussion, assessment and recommendations, at the time. [name] recent genetic test results were disclosed to her, as were recommendations warranted by these results. These results and recommendations are discussed in more detail below.  CANCER HISTORY:  Oncology History  Anal cancer ([place])  01/10/2019 Initial Diagnosis   Anal cancer ([place])   01/16/2019 Cancer Staging   Staging form: Anus, AJCC 8th Edition - Clinical: Stage Unknown (cTX, cN0, cM0) - Signed by Derek Jack, MD on 01/16/2019   02/03/2019 -  Chemotherapy   The patient had palonosetron (ALOXI) injection 0.25 mg, 0.25 mg, Intravenous,  Once, 1 of 1 cycle Administration: 0.25 mg (02/03/2019), 0.25 mg (03/03/2019) mitoMYcin (MUTAMYCIN) chemo injection 20 mg, 10.1 mg/m2 = 19.5 mg, Intravenous,  Once, 1 of 1 cycle Administration: 20 mg (02/03/2019), 20 mg (03/03/2019) fluorouracil (ADRUCIL) 7,900 mg in sodium chloride 0.9 % 92 mL chemo infusion, 1,000 mg/m2/day = 7,900 mg, Intravenous, 4D (96 hours ), 1 of 1 cycle Administration: 7,900 mg (02/03/2019), 7,900 mg (03/03/2019)  for chemotherapy treatment.    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  The Common Hereditary Gene Panel offered by [facility] includes sequencing and/or deletion duplication testing of the following 48 genes: APC, ATM, AXIN2, BARD1, BMPR1A, BRCA1, BRCA2, BRIP1, CDH1, CDK4, CDKN2A (p14ARF), CDKN2A (p16INK4a), CHEK2, CTNNA1, DICER1, EPCAM (Deletion/duplication testing only), GREM1 (promoter region deletion/duplication testing only), KIT, MEN1, MLH1, MSH2, MSH3, MSH6, MUTYH, NBN,  NF1, NHTL1, PALB2, PDGFRA, PMS2, POLD1, POLE, PTEN, RAD50, RAD51C, RAD51D, RNF43, SDHB, SDHC, SDHD, SMAD4, SMARCA4. STK11, TP53, TSC1, TSC2, and VHL.  The following genes were evaluated for sequence changes only: SDHA and HOXB13 c.251G>A variant only. The report date is 08/07/2019.     FAMILY HISTORY:  We obtained a detailed, 4-generation family history.  Significant diagnoses are listed below: Family History  Problem Relation Age of Onset  . Breast cancer Mother 84  . Hypertension Father   . Diabetes Father   . Hypertension Sister   . Miscarriages / Stillbirths Sister   . Breast cancer Sister 32  . Heart disease Maternal Grandmother   . Diabetes Maternal Grandmother   . Heart disease Maternal Grandfather   . Diabetes Paternal Grandmother   . Cancer Paternal Grandmother        unknown form of cancer  . Heart disease Paternal Grandfather   . Stroke Paternal Grandfather     The patient does not have children.  She has a sister who was diagnosed at age 61 with breast cancer.  Reportedly, genetic testing was negative.  Both parents are living.    The patient's mother was diagnosed with breast cancer at age 70.  She has a brother and sister who are cancer free.  Her mother is living and father died of a heart attack.  The patient's father is living.  He had two brothers who have died young, one from diabetes and one from a heart attack.  His mother had some form of cancer.  [name] is aware of previous family history of genetic testing for hereditary cancer risks. Patient's maternal ancestors are of Caucasian descent, and paternal ancestors are of [name] descent. There is no reported Ashkenazi  Jewish ancestry. There is no known consanguinity.     GENETIC TEST RESULTS: Genetic testing reported out on August 07, 2019 through the common hereditary cancer panel found no pathogenic mutations. The Common Hereditary Gene Panel offered by [facility] includes sequencing and/or deletion  duplication testing of the following 48 genes: APC, ATM, AXIN2, BARD1, BMPR1A, BRCA1, BRCA2, BRIP1, CDH1, CDK4, CDKN2A (p14ARF), CDKN2A (p16INK4a), CHEK2, CTNNA1, DICER1, EPCAM (Deletion/duplication testing only), GREM1 (promoter region deletion/duplication testing only), KIT, MEN1, MLH1, MSH2, MSH3, MSH6, MUTYH, NBN, NF1, NHTL1, PALB2, PDGFRA, PMS2, POLD1, POLE, PTEN, RAD50, RAD51C, RAD51D, RNF43, SDHB, SDHC, SDHD, SMAD4, SMARCA4. STK11, TP53, TSC1, TSC2, and VHL.  The following genes were evaluated for sequence changes only: SDHA and HOXB13 c.251G>A variant only. The test report has been scanned into EPIC and is located under the Molecular Pathology section of the Results Review tab.  A portion of the result report is included below for reference.     We discussed with [name] that because current genetic testing is not perfect, it is possible there may be a gene mutation in one of these genes that current testing cannot detect, but that chance is small.  We also discussed, that there could be another gene that has not yet been discovered, or that we have not yet tested, that is responsible for the cancer diagnoses in the family. It is also possible there is a hereditary cause for the cancer in the family that [name] did not inherit and therefore was not identified in her testing.  Therefore, it is important to remain in touch with cancer genetics in the future so that we can continue to offer [name] the most up to date genetic testing.   Genetic testing did identify a variant of uncertain significance (VUS) was identified in the RAD50 gene called c.1336A>G (p.Lys446Glu).  At this time, it is unknown if this variant is associated with increased cancer risk or if this is a normal finding, but most variants such as this get reclassified to being inconsequential. It should not be used to make medical management decisions. With time, we suspect the lab will determine the significance of this  variant, if any. If we do learn more about it, we will try to contact [name] to discuss it further. However, it is important to stay in touch with [name] periodically and keep the address and phone number up to date.  ADDITIONAL GENETIC TESTING: We discussed with [name] that there are other genes that are associated with increased cancer risk that can be analyzed. Should [name] wish to pursue additional genetic testing, we are happy to discuss and coordinate this testing, at any time.    CANCER SCREENING RECOMMENDATIONS: [name] test result is considered negative (normal).  This means that we have not identified a hereditary cause for her personal and family history of cancer at this time. Most cancers happen by chance and this negative test suggests that her cancer may fall into this category.    While reassuring, this does not definitively rule out a hereditary predisposition to cancer. It is still possible that there could be genetic mutations that are undetectable by current technology. There could be genetic mutations in genes that have not been tested or identified to increase cancer risk.  Therefore, it is recommended she continue to follow the cancer management and screening guidelines provided by her oncology and primary healthcare provider.   An individual's cancer risk and medical management are not determined by genetic  test results alone. Overall cancer risk assessment incorporates additional factors, including personal medical history, family history, and any available genetic information that may result in a personalized plan for cancer prevention and surveillance  RECOMMENDATIONS FOR FAMILY MEMBERS:  Individuals in this family might be at some increased risk of developing cancer, over the general population risk, simply due to the family history of cancer.  We recommended women in this family have a yearly mammogram beginning at age 48, or 24 years younger than the  earliest onset of cancer, an annual clinical breast exam, and perform monthly breast self-exams. Women in this family should also have a gynecological exam as recommended by their primary provider. All family members should have a colonoscopy by age 48.  It is also possible there is a hereditary cause for the cancer in [name] family that she did not inherit and therefore was not identified in her.  Based on [name] family history, we recommended her sister, who was diagnosed with breast cancer at age 18, have genetic counseling and testing. She reportedly has had genetic testing that is negative and therefore, it is unlikely that the breast cancer in the family is hereditary.  FOLLOW-UP: Lastly, we discussed with [name] that cancer genetics is a rapidly advancing field and it is possible that new genetic tests will be appropriate for her and/or her family members in the future. We encouraged her to remain in contact with cancer genetics on an annual basis so we can update her personal and family histories and let her know of advances in cancer genetics that may benefit this family.   Our contact number was provided. [name] questions were answered to her satisfaction, and she knows she is welcome to call us at anytime with additional questions or concerns.   Roma Kayser, [place], [facility] Licensed, Certified Genetic Counselor [email or web address]

## 2019-08-11 NOTE — Telephone Encounter (Signed)
I called pt and she is needing the prudential p/w filled out , which she states that we have done previously.  She has seen Dr. Tomi Likens, who she says has referred her to [facility], stating she is complicated case.  She states that we are treating her with steroids, but last not I see that [facility] was given orders by Dr Tomi Likens.  I spoke to MM/NP and the form will need to go to Dr. Tomi Likens.  I relayed to pt and will fax form to Dr. Loretta Plume who is treating pt now. She will notify their office.  Hilda Blades in [place] notified and  will fax form to them.

## 2019-08-11 NOTE — Telephone Encounter (Signed)
In process with [facility]. Waiting on response.

## 2019-08-11 NOTE — Telephone Encounter (Signed)
Pt called, need [employer] form fill out by [facility], I told pt that she saw a doctor @ [place] and that office  need to fill out this form. Pt last visit here 03/24/19. Pt want call back from the nurse.

## 2019-08-11 NOTE — Telephone Encounter (Signed)
Patient called wanting to know if Dr. Tomi Likens is willing to complete Disability forms for her? She said she needs them completed as soon as possible.

## 2019-08-11 NOTE — Telephone Encounter (Addendum)
Amber Williams with [facility] called to report a blocked IV line. Patient has missed two doses of solu-medrol over the weekend.  They are requesting a "Cathflo 2 MG" order as soon as possible.

## 2019-08-11 NOTE — Telephone Encounter (Signed)
Revealed negative genetic testing.  Discussed that we do not know why she has anal cancer or why there is breast cancer in the family. It could be due to a different gene that we are not testing, or maybe our current technology may not be able to pick something up.  It will be important for her to keep in contact with genetics to keep up with whether additional testing may be needed.  There is a VUS in RAD50 but this will not change medical management.

## 2019-08-11 NOTE — Telephone Encounter (Signed)
OK 

## 2019-08-12 ENCOUNTER — Encounter (HOSPITAL_COMMUNITY): Payer: Self-pay

## 2019-08-12 ENCOUNTER — Other Ambulatory Visit (HOSPITAL_COMMUNITY): Payer: Self-pay | Admitting: Nurse Practitioner

## 2019-08-13 ENCOUNTER — Other Ambulatory Visit (HOSPITAL_COMMUNITY): Payer: Self-pay | Admitting: Surgery

## 2019-08-13 ENCOUNTER — Telehealth (HOSPITAL_COMMUNITY): Payer: Self-pay | Admitting: Surgery

## 2019-08-13 ENCOUNTER — Encounter (HOSPITAL_COMMUNITY): Payer: Self-pay

## 2019-08-13 DIAGNOSIS — C210 Malignant neoplasm of anus, unspecified: Secondary | ICD-10-CM

## 2019-08-13 NOTE — Telephone Encounter (Signed)
Pt had left an in basket message stating that she went to the ED yesterday due to black diarrhea.  She had labs and a CT at [facility].  Per pt, the doctor told her that her intestines were inflamed and that she needed to let her oncologist know immediately, and that a PET scan would be recommmended.  The pt also stated that she was treated for a UTI with IV antibiotics and was given a prescription for oral antibiotics.    I notified Dr. Delton Coombes, and he ordered standard labs for the pt and that she be put on his schedule as soon as possible.  Amy scheduled the pt for labs tomorrow at 2:00, and to see Dr. Delton Coombes on Monday, April 19, at 2:50.  I called the pt and she verbalized understanding of upcoming appointments.

## 2019-08-14 ENCOUNTER — Other Ambulatory Visit: Payer: Self-pay

## 2019-08-14 ENCOUNTER — Inpatient Hospital Stay (HOSPITAL_COMMUNITY): Payer: Medicare HMO | Attending: Hematology

## 2019-08-14 ENCOUNTER — Other Ambulatory Visit (HOSPITAL_COMMUNITY): Payer: Self-pay | Admitting: Nurse Practitioner

## 2019-08-14 DIAGNOSIS — C210 Malignant neoplasm of anus, unspecified: Secondary | ICD-10-CM | POA: Diagnosis not present

## 2019-08-14 DIAGNOSIS — Z803 Family history of malignant neoplasm of breast: Secondary | ICD-10-CM | POA: Insufficient documentation

## 2019-08-14 DIAGNOSIS — Z87891 Personal history of nicotine dependence: Secondary | ICD-10-CM | POA: Diagnosis not present

## 2019-08-14 DIAGNOSIS — G35 Multiple sclerosis: Secondary | ICD-10-CM | POA: Insufficient documentation

## 2019-08-14 DIAGNOSIS — Z809 Family history of malignant neoplasm, unspecified: Secondary | ICD-10-CM | POA: Diagnosis not present

## 2019-08-14 LAB — COMPREHENSIVE METABOLIC PANEL
ALT: 14 U/L (ref 0–44)
AST: 14 U/L — ABNORMAL LOW (ref 15–41)
Albumin: 3.6 g/dL (ref 3.5–5.0)
Alkaline Phosphatase: 63 U/L (ref 38–126)
Anion gap: 7 (ref 5–15)
BUN: 9 mg/dL (ref 6–20)
CO2: 24 mmol/L (ref 22–32)
Calcium: 9.3 mg/dL (ref 8.9–10.3)
Chloride: 109 mmol/L (ref 98–111)
Creatinine, Ser: 0.85 mg/dL (ref 0.44–1.00)
GFR calc Af Amer: >60 mL/min (ref >60)
GFR calc non Af Amer: >60 mL/min (ref >60)
Glucose, Bld: 52 mg/dL — ABNORMAL LOW (ref 70–99)
Potassium: 4.1 mmol/L (ref 3.5–5.1)
Sodium: 140 mmol/L (ref 135–145)
Total Bilirubin: 0.4 mg/dL (ref 0.3–1.2)
Total Protein: 5.4 g/dL — ABNORMAL LOW (ref 6.5–8.1)

## 2019-08-14 LAB — CBC WITH DIFFERENTIAL/PLATELET
Abs Immature Granulocytes: 0.03 10*3/uL (ref 0.00–0.07)
Basophils Absolute: 0 10*3/uL (ref 0.0–0.1)
Basophils Relative: 0 %
Eosinophils Absolute: 0.2 10*3/uL (ref 0.0–0.5)
Eosinophils Relative: 3 %
HCT: 41.1 % (ref 36.0–46.0)
Hemoglobin: 13 g/dL (ref 12.0–15.0)
Immature Granulocytes: 0 %
Lymphocytes Relative: 8 %
Lymphs Abs: 0.6 10*3/uL — ABNORMAL LOW (ref 0.7–4.0)
MCH: 31.6 pg (ref 26.0–34.0)
MCHC: 31.6 g/dL (ref 30.0–36.0)
MCV: 100 fL (ref 80.0–100.0)
Monocytes Absolute: 0.7 10*3/uL (ref 0.1–1.0)
Monocytes Relative: 9 %
Neutro Abs: 6 10*3/uL (ref 1.7–7.7)
Neutrophils Relative %: 80 %
Platelets: 223 10*3/uL (ref 150–400)
RBC: 4.11 MIL/uL (ref 3.87–5.11)
RDW: 14.2 % (ref 11.5–15.5)
WBC: 7.6 10*3/uL (ref 4.0–10.5)
nRBC: 0 % (ref 0.0–0.2)

## 2019-08-14 LAB — LACTATE DEHYDROGENASE: LDH: 121 U/L (ref 98–192)

## 2019-08-14 LAB — MAGNESIUM: Magnesium: 2 mg/dL (ref 1.7–2.4)

## 2019-08-14 MED ORDER — TRAMADOL HCL 50 MG PO TABS: 25.0000 mg | ORAL_TABLET | Freq: Four times a day (QID) | ORAL | 0 refills | Status: DC | PRN

## 2019-08-15 ENCOUNTER — Ambulatory Visit (INDEPENDENT_AMBULATORY_CARE_PROVIDER_SITE_OTHER): Payer: Medicare HMO | Admitting: Licensed Clinical Social Worker

## 2019-08-15 DIAGNOSIS — F331 Major depressive disorder, recurrent, moderate: Secondary | ICD-10-CM | POA: Diagnosis not present

## 2019-08-15 DIAGNOSIS — F4310 Post-traumatic stress disorder, unspecified: Secondary | ICD-10-CM

## 2019-08-15 DIAGNOSIS — G35 Multiple sclerosis: Secondary | ICD-10-CM

## 2019-08-15 DIAGNOSIS — G35D Multiple sclerosis, unspecified: Secondary | ICD-10-CM

## 2019-08-15 DIAGNOSIS — E039 Hypothyroidism, unspecified: Secondary | ICD-10-CM

## 2019-08-15 NOTE — Chronic Care Management (AMB) (Signed)
Chronic Care Management    Clinical Social Work Follow Up Note  08/15/2019 Name: SHALAH MARIAN MRN: [id number] DOB: 07/29/1975  Tawni Pummel Kimpel is a 44 y.o. year old female who is a primary care patient of [name], [name], MD. The CCM team was consulted for assistance with [employer].   Review of patient status, including review of consultants reports, other relevant assessments, and collaboration with appropriate care team members and the patient's provider was performed as part of comprehensive patient evaluation and provision of chronic care management services.    SDOH (Social Determinants of Health) assessments performed: Yes; risk for tobacco use; risk for depression    Office Visit from 05/20/2019 in [place]  PHQ-9 Total Score  1     GAD 7 : Generalized Anxiety Score 04/01/2019  Nervous, Anxious, on Edge 1  Control/stop worrying 0  Worry too much - different things 0  Trouble relaxing 1  Restless 0  Easily annoyed or irritable 1  Afraid - awful might happen 0  Total GAD 7 Score 3  Anxiety Difficulty Somewhat difficult    Outpatient Encounter Medications as of 08/15/2019  Medication Sig  . ALLERGY RELIEF 180 MG tablet Take 180 mg by mouth daily as needed for allergies.   [name] amitriptyline (ELAVIL) 50 MG tablet Take 1 tablet (50 mg total) by mouth at bedtime.  . Cholecalciferol (VITAMIN D3) 5000 units CAPS Take 5,000 Units by mouth every morning.  . escitalopram (LEXAPRO) 10 MG tablet Take 1 tablet (10 mg total) by mouth daily.  . flurazepam (DALMANE) 30 MG capsule Take 1 capsule (30 mg total) by mouth at bedtime.  [name] levonorgestrel-ethinyl estradiol (NORDETTE) 0.15-30 MG-MCG tablet Take 1 tablet by mouth daily.  [name] levothyroxine (SYNTHROID) 125 MCG tablet TAKE 1 TABLET BY MOUTH DAILY BEFORE BREAKFAST  . loperamide (IMODIUM) 1 MG/5ML solution Take 6 mg by mouth as needed for diarrhea or loose stools.   . mometasone (NASONEX) 50 MCG/ACT  nasal spray Place 2 sprays into the nose 2 (two) times daily as needed (allergies).   Wess Botts (OCREVUS IV) Inject into the vein every 6 (six) months.   [name] omeprazole (PRILOSEC) 20 MG capsule Take 1 capsule (20 mg total) by mouth daily.  . traMADol (ULTRAM) 50 MG tablet Take 0.5 tablets (25 mg total) by mouth every 6 (six) hours as needed.  . traZODone (DESYREL) 150 MG tablet Use from 1/3 to 1 tablet nightly as needed for sleep.  . vitamin B-12 (CYANOCOBALAMIN) 500 MCG tablet Take 500 mcg by mouth daily.   No facility-administered encounter medications on file as of 08/15/2019.    Goals    . Client will talk with LCSW in next 3 weeks to discuss stress/anxiety management for client (pt-stated)     Current Barriers:  . Client has MS (multiple sclerosis) and has Chronic Diagnoses also of Depression, PTSD, and Hypothyroidism . Client has decreased energy . Decreased appetite  Clinical Social Work Clinical Goal(s):  [name] LCSW will talk with client in next 3 weeks about stress/anxiety management for client  Interventions:  Talked with client about pain issues of client  Talked with client about CCM program services  Encouraged client to talk with RNCM to discuss nursing needs of client  Talked with [facility] about her use of food stamps benefit  Talked with Jacinda previously about health needs of her sister  Talked with Mendy about health needs of her mother  Talked with client about ambulation challenges (she is  using cane to help her walk)  Talked with client about financial needs of client  Talked with client about Disability application forms completed and sent in  Talked with Grecia about recent falls of client (no serious injury from fall)  Talked with [facility] about upcoming client appointments.  Provided counseling support for client  Talked with client about completion of daily ADLs  Talked with client about transport needs of client  Patient Self Care Activities:    Attends classes, as able a, at [facility] medications as prescribed.  Plan:  Client to attend scheduled client medical appointments Client to communicate with RNCM to discuss nursing needs of client LCSW to call client in next 4 weeks to talk with her about managing anxiety and stress issues of client  Initial goal documentation        Follow Up Plan: LCSW to call client in next 4 weeks to talk with client about managing anxiety and stress issues of client  [name].Daisa Stennis MSW, LCSW Licensed Clinical Social Worker [facility] Care Management [phone]

## 2019-08-15 NOTE — Telephone Encounter (Signed)
Pt called states she only has $20 & needs the forms as soon as possible. Please call pt with update on Disability forms. [phone].

## 2019-08-15 NOTE — Patient Instructions (Addendum)
Licensed Clinical Social Worker Visit Information  Goals we discussed today:  Goals        . Client will talk with LCSW in next 3 weeks to discuss stress/anxiety management for client (pt-stated)     Current Barriers:  . Client has MS (multiple sclerosis) and has Chronic Diagnoses also of Depression, PTSD, and Hypothyroidism . Client has decreased energy . Decreased appetite  Clinical Social Work Clinical Goal(s):  [name] LCSW will talk with client in next 3 weeks about stress/anxiety management for client  Interventions: Talked with client about pain issues of client  Talked with client about CCM program services  Encouraged client to talk with RNCM to discuss nursing needs of client  Talked with [facility] about her use of food stamps benefit  Talked with Timothy previously about health needs of her sister  Talked with Tyeisha about health needs of her mother  Talked with client about ambulation challenges (she is using cane to help her walk)  Talked with client about financial needs of client  Talked with client about Disability application forms completed and sent in  Talked with Hlee about recent falls of client (no serious injury from fall)  Talked with [facility] about upcoming client appointments.  Provided counseling support for client  Talked with client about completion of daily ADLs  Talked with client about transport needs of client  Patient Self Care Activities:   Attends classes, as able a, at [facility] medications as prescribed.  Plan:  Client to attend scheduled client medical appointments Client to communicate with RNCM to discuss nursing needs of client LCSW to call client in next 3 weeks to talk with her about managing anxiety and stress issues of client  Initial goal documentation    .        Materials Provided: No  Follow Up Plan:LCSW to call client in next 4 weeks to talk with her about managing anxiety and stress issues of  client  The patient verbalized understanding of instructions provided today and declined a print copy of patient instruction materials.    [name].Blythe Hartshorn MSW, LCSW Licensed Clinical Social Worker [facility] [phone]

## 2019-08-15 NOTE — Telephone Encounter (Signed)
Telephone call to pt. Disability forms faxed 08/11/19.

## 2019-08-18 ENCOUNTER — Ambulatory Visit: Payer: Medicare HMO | Admitting: Family Medicine

## 2019-08-18 ENCOUNTER — Telehealth: Payer: Self-pay

## 2019-08-18 ENCOUNTER — Other Ambulatory Visit: Payer: Self-pay

## 2019-08-18 ENCOUNTER — Inpatient Hospital Stay (HOSPITAL_COMMUNITY): Payer: Medicare HMO | Admitting: Hematology

## 2019-08-18 ENCOUNTER — Encounter (HOSPITAL_COMMUNITY): Payer: Self-pay | Admitting: Hematology

## 2019-08-18 VITALS — BP 107/44 | HR 96 | Temp 97.7°F | Resp 18 | Wt 177.2 lb

## 2019-08-18 DIAGNOSIS — N390 Urinary tract infection, site not specified: Secondary | ICD-10-CM | POA: Diagnosis not present

## 2019-08-18 DIAGNOSIS — C210 Malignant neoplasm of anus, unspecified: Secondary | ICD-10-CM

## 2019-08-18 MED ORDER — SODIUM CHLORIDE 0.9% FLUSH
10.0000 mL | Freq: Once | INTRAVENOUS | Status: AC
  Administered 2019-08-18: 10 mL via INTRAVENOUS

## 2019-08-18 MED ORDER — HEPARIN SOD (PORK) LOCK FLUSH 100 UNIT/ML IV SOLN
500.0000 [IU] | Freq: Once | INTRAVENOUS | Status: AC
  Administered 2019-08-18: 15:00:00 500 [IU] via INTRAVENOUS

## 2019-08-18 MED ORDER — CIPROFLOXACIN HCL 500 MG PO TABS: 500.0000 mg | ORAL_TABLET | Freq: Two times a day (BID) | ORAL | 0 refills | Status: DC

## 2019-08-18 NOTE — Telephone Encounter (Signed)
OK 

## 2019-08-18 NOTE — Telephone Encounter (Signed)
Telephone cal from [place] with [facility], Need an order  [employer].

## 2019-08-18 NOTE — Progress Notes (Signed)
I received a phone call from [facility] with [facility].  She reports that they have been giving patient steroids at home and she has not been able to flush her port.  They called on-call physician over the weekend and Cathflo was used without results.    Patient is here today and Dr. Delton Coombes already aware.  He will see patient and assess.  \  Treatment room nurses aware of possible add on for today.

## 2019-08-18 NOTE — Progress Notes (Signed)
[address] [place], [place] [address]   CLINIC:  Medical Oncology/Hematology  PCP:  [name], MD [place] [address] [phone]   REASON FOR VISIT:  Follow-up for anal cancer.  CURRENT THERAPY: Observation.  BRIEF ONCOLOGIC HISTORY:  Oncology History  Anal cancer ([place])  01/10/2019 Initial Diagnosis   Anal cancer ([place])   01/16/2019 Cancer Staging   Staging form: Anus, AJCC 8th Edition - Clinical: Stage Unknown (cTX, cN0, cM0) - Signed by Derek Jack, MD on 01/16/2019   02/03/2019 -  Chemotherapy   The patient had palonosetron (ALOXI) injection 0.25 mg, 0.25 mg, Intravenous,  Once, 1 of 1 cycle Administration: 0.25 mg (02/03/2019), 0.25 mg (03/03/2019) mitoMYcin (MUTAMYCIN) chemo injection 20 mg, 10.1 mg/m2 = 19.5 mg, Intravenous,  Once, 1 of 1 cycle Administration: 20 mg (02/03/2019), 20 mg (03/03/2019) fluorouracil (ADRUCIL) 7,900 mg in sodium chloride 0.9 % 92 mL chemo infusion, 1,000 mg/m2/day = 7,900 mg, Intravenous, 4D (96 hours ), 1 of 1 cycle Administration: 7,900 mg (02/03/2019), 7,900 mg (03/03/2019)  for chemotherapy treatment.    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  The Common Hereditary Gene Panel offered by [facility] includes sequencing and/or deletion duplication testing of the following 48 genes: APC, ATM, AXIN2, BARD1, BMPR1A, BRCA1, BRCA2, BRIP1, CDH1, CDK4, CDKN2A (p14ARF), CDKN2A (p16INK4a), CHEK2, CTNNA1, DICER1, EPCAM (Deletion/duplication testing only), GREM1 (promoter region deletion/duplication testing only), KIT, MEN1, MLH1, MSH2, MSH3, MSH6, MUTYH, NBN, NF1, NHTL1, PALB2, PDGFRA, PMS2, POLD1, POLE, PTEN, RAD50, RAD51C, RAD51D, RNF43, SDHB, SDHC, SDHD, SMAD4, SMARCA4. STK11, TP53, TSC1, TSC2, and VHL.  The following genes were evaluated for sequence changes only: SDHA and HOXB13 c.251G>A  variant only. The report date is 08/07/2019.      CANCER STAGING: Cancer Staging Anal cancer ([place]) Staging form: Anus, AJCC 8th Edition - Clinical: Stage Unknown (cTX, cN0, cM0) - Signed by Derek Jack, MD on 01/16/2019    INTERVAL HISTORY:  Ms. Monk 44 y.o. female seen for follow-up of anal squamous cell carcinoma.  Recently went to the ER when she had diarrhea and black bowel movements.  Because of diarrhea she tried taking Imodium which did not help.  She took Pepto-Bismol also.  She went to ER at [facility] scan was done.  Reports appetite 50%.  Energy levels are 25%.  She also had a flareup of multiple sclerosis.  She received 3 days of high-dose IV steroids.  She rarely sees some blood on the tissue paper.  She is accompanied by her mother today.   REVIEW OF SYSTEMS:  Review of Systems  Constitutional: Positive for fatigue.  Gastrointestinal: Positive for blood in stool and diarrhea.  All other systems reviewed and are negative.    PAST MEDICAL/SURGICAL HISTORY:  Past Medical History:  Diagnosis Date  . Allergy   . Anal cancer ([place]) 01/10/2019  . Arthritis   . Blood transfusion without reported diagnosis    2013  . Depression   . DVT (deep venous thrombosis) ([place]) 2013  . Family history of breast cancer   . Gait abnormality 08/03/2016  . Hyperlipidemia   . Hypertension   . Hypothyroidism   . Multiple sclerosis ([place])   . Neurogenic bladder 08/03/2016  . Neuromuscular disorder ([place])    dx'd with MS at age of 47  . Port-A-Cath in place 01/23/2019  . PTSD (post-traumatic stress disorder)   .  Right foot drop 08/03/2016  . Thyroid disease   . Ulcer    Past Surgical History:  Procedure Laterality Date  . COLON SURGERY     Colonoscopy  . FLEXIBLE SIGMOIDOSCOPY N/A 06/25/2019   Procedure: FLEXIBLE SIGMOIDOSCOPY ANOSCOPY;  Surgeon: Aviva Signs, MD;  Location: [facility];  Service: General;  Laterality: N/A;  . GASTRIC BYPASS  2010  . HEMORRHOID SURGERY  N/A 12/25/2018   Procedure: EXTENSIVE HEMORRHOIDECTOMY;  Surgeon: Aviva Signs, MD;  Location: [facility];  Service: General;  Laterality: N/A;  . INTERSTIM IMPLANT PLACEMENT  2014  . PORTACATH PLACEMENT       SOCIAL HISTORY:  Social History   Socioeconomic History  . Marital status: Single    Spouse name: Ricky  . Number of children: 0  . Years of education: 52  . Highest education level: Associate degree: occupational, Hotel manager, or vocational program  Occupational History  . Occupation: Disability  Tobacco Use  . Smoking status: Former Smoker    Packs/day: 1.50    Years: 15.00    Pack years: 22.50    Types: Cigarettes    Start date: 1993    Quit date: 01/30/2009    Years since quitting: 10.5  . Smokeless tobacco: Never Used  Substance and Sexual Activity  . 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 Drug use: No  . Sexual activity: Yes  Other Topics Concern  . Not on file  Social History Narrative   Lives at home with her boyfriend   Right-handed   Caffeine: 2 glasses per day   One story home   Social Determinants of Health   Financial Resource Strain:   . Difficulty of Paying Living Expenses:   Food Insecurity:   . Worried About Charity fundraiser in the Last Year:   . Arboriculturist in the Last Year:   Transportation Needs:   . Film/video editor (Medical):   [name] Lack of Transportation (Non-Medical):   Physical Activity:   . Days of Exercise per Week:   . Minutes of Exercise per Session:   Stress:   . Feeling of Stress :   Social Connections: Unknown  . Frequency of Communication with Friends and Family: More than three times a week  . Frequency of Social Gatherings with Friends and Family: More than three times a week  . Attends Religious Services: Not on file  . Active Member of Clubs or Organizations: Not on file  . Attends Archivist Meetings: Not on file  . Marital Status: Living with partner  Intimate Partner Violence:    . Fear of Current or Ex-Partner:   . Emotionally Abused:   [name] Physically Abused:   . Sexually Abused:     FAMILY HISTORY:  Family History  Problem Relation Age of Onset  . Breast cancer Mother 30  . Hypertension Father   . Diabetes Father   . Hypertension Sister   . Miscarriages / Stillbirths Sister   . Breast cancer Sister 12  . Heart disease Maternal Grandmother   . Diabetes Maternal Grandmother   . Heart disease Maternal Grandfather   . Diabetes Paternal Grandmother   . Cancer Paternal Grandmother        unknown form of cancer  . Heart disease Paternal Grandfather   . Stroke Paternal Grandfather     CURRENT MEDICATIONS:  Outpatient Encounter Medications as of 08/18/2019  Medication Sig  . amitriptyline (ELAVIL) 50 MG tablet Take  1 tablet (50 mg total) by mouth at bedtime.  [name] CATHFLO ACTIVASE 2 MG injection   . cephALEXin (KEFLEX) 500 MG capsule Take 500 mg by mouth 2 (two) times daily.  . Cholecalciferol (VITAMIN D3) 5000 units CAPS Take 5,000 Units by mouth every morning.  . escitalopram (LEXAPRO) 10 MG tablet Take 1 tablet (10 mg total) by mouth daily.  . flurazepam (DALMANE) 30 MG capsule Take 1 capsule (30 mg total) by mouth at bedtime.  [name] levonorgestrel-ethinyl estradiol (NORDETTE) 0.15-30 MG-MCG tablet Take 1 tablet by mouth daily.  [name] levothyroxine (SYNTHROID) 125 MCG tablet TAKE 1 TABLET BY MOUTH DAILY BEFORE BREAKFAST  . Ocrelizumab (OCREVUS IV) Inject into the vein every 6 (six) months.   [name] omeprazole (PRILOSEC) 20 MG capsule Take 1 capsule (20 mg total) by mouth daily.  . vitamin B-12 (CYANOCOBALAMIN) 500 MCG tablet Take 500 mcg by mouth daily.  . ALLERGY RELIEF 180 MG tablet Take 180 mg by mouth daily as needed for allergies.   . ciprofloxacin (CIPRO) 500 MG tablet Take 1 tablet (500 mg total) by mouth 2 (two) times daily.  [name] loperamide (IMODIUM) 1 MG/5ML solution Take 6 mg by mouth as needed for diarrhea or loose stools.   . mometasone (NASONEX) 50 MCG/ACT  nasal spray Place 2 sprays into the nose 2 (two) times daily as needed (allergies).   . ondansetron (ZOFRAN-ODT) 4 MG disintegrating tablet Take 4 mg by mouth every 8 (eight) hours as needed.  . traMADol (ULTRAM) 50 MG tablet Take 0.5 tablets (25 mg total) by mouth every 6 (six) hours as needed. (Patient not taking: Reported on 08/18/2019)  . traZODone (DESYREL) 150 MG tablet Use from 1/3 to 1 tablet nightly as needed for sleep. (Patient not taking: Reported on 08/18/2019)  . [DISCONTINUED] methylPREDNISolone sodium succinate (SOLU-MEDROL) 1000 MG injection   . [EXPIRED] heparin lock flush 100 unit/mL   . [EXPIRED] sodium chloride flush (NS) 0.9 % injection 10 mL    No facility-administered encounter medications on file as of 08/18/2019.    ALLERGIES:  Allergies  Allergen Reactions  . Other Hives    Plastic Tape  . Tecfidera [Dimethyl Fumarate] Itching     PHYSICAL EXAM:  ECOG Performance status: 1  Vitals:   08/18/19 1500  BP: (!) 107/44  Pulse: 96  Resp: 18  Temp: 97.7 F (36.5 C)  SpO2: 100%   Filed Weights   08/18/19 1500  Weight: 177 lb 3.2 oz (80.4 kg)    Physical Exam Vitals reviewed.  Constitutional:      Appearance: Normal appearance.  Cardiovascular:     Rate and Rhythm: Normal rate and regular rhythm.     Heart sounds: Normal heart sounds.  Pulmonary:     Breath sounds: Normal breath sounds.  Abdominal:     General: There is no distension.     Palpations: Abdomen is soft. There is no mass.  Musculoskeletal:        General: No swelling.  Skin:    General: Skin is warm.  Neurological:     General: No focal deficit present.     Mental Status: She is alert and oriented to person, place, and time.  Psychiatric:        Mood and Affect: Mood normal.        Behavior: Behavior normal.      LABORATORY DATA:  I have reviewed the labs as listed.  CBC    Component Value Date/Time   WBC 7.6 08/14/2019 1344  RBC 4.11 08/14/2019 1344   HGB 13.0  08/14/2019 1344   HGB 14.1 08/07/2019 1715   HCT 41.1 08/14/2019 1344   HCT 42.7 08/07/2019 1715   PLT 223 08/14/2019 1344   PLT 250 08/07/2019 1715   MCV 100.0 08/14/2019 1344   MCV 96 08/07/2019 1715   MCH 31.6 08/14/2019 1344   MCHC 31.6 08/14/2019 1344   RDW 14.2 08/14/2019 1344   RDW 12.5 08/07/2019 1715   LYMPHSABS 0.6 (L) 08/14/2019 1344   LYMPHSABS 0.2 (L) 08/07/2019 1715   MONOABS 0.7 08/14/2019 1344   EOSABS 0.2 08/14/2019 1344   EOSABS 0.0 08/07/2019 1715   BASOSABS 0.0 08/14/2019 1344   BASOSABS 0.0 08/07/2019 1715   CMP Latest Ref Rng & Units 08/14/2019 08/07/2019 06/02/2019  Glucose 70 - 99 mg/dL 52(L) 115(H) 82  BUN 6 - 20 mg/dL 9 14 13   Creatinine 0.44 - 1.00 mg/dL 0.85 0.84 0.86  Sodium 135 - 145 mmol/L 140 143 136  Potassium 3.5 - 5.1 mmol/L 4.1 4.0 4.4  Chloride 98 - 111 mmol/L 109 105 104  CO2 22 - 32 mmol/L 24 20 26   Calcium 8.9 - 10.3 mg/dL 9.3 10.2 9.5  Total Protein 6.5 - 8.1 g/dL 5.4(L) 5.7(L) 5.7(L)  Total Bilirubin 0.3 - 1.2 mg/dL 0.4 0.3 0.6  Alkaline Phos 38 - 126 U/L 63 77 67  AST 15 - 41 U/L 14(L) 11 14(L)  ALT 0 - 44 U/L 14 14 12        DIAGNOSTIC IMAGING:  I have independently reviewed scans and outside hospital records.     ASSESSMENT & PLAN:   Anal cancer ([place]) 1.  Anal squamous cell carcinoma, p16 positive: -PET scan on 01/15/2019 did not show any evidence of metastatic disease. -XRT with 5-FU and mitomycin from 02/03/2019 through 03/03/2019. -PET scan on 06/02/2019 showed uptake at the anorectal junction.  No pelvic hypermetabolism. -Sigmoidoscopy on 06/25/2019 by Dr. Arnoldo Morale did not reveal any abnormalities. -Recently had episodes of black stools.  She reported taking Pepto-Bismol.  She went to the ER at [facility]. -I have obtained and reviewed CT abdomen and pelvis from 08/12/2019 which showed small amount of mural thickening towards the anal verge.  No other abnormality in the abdomen or pelvis.  No adenopathy.  Presacral  neurostimulator present. -I have also reviewed her recent labs.  LFTs, LDH and CBC are normal. -She is scheduled for PET scan in June.  We will see her back after that.  2.  Multiple sclerosis: -Several years duration, resulting in right-sided weakness, fatigue, memory issues and headaches.  She walks with a cane/walker. -Treated with multiple agents including Tecfidera, Betaseron, Avonex, Copaxone.  Also received mitoxantrone in the past. -Ocrevus is currently stopped.  She recently had a flareup.  She was treated with 3/5 days of high-dose steroids.  She could not receive further IV steroids secondary to port not giving blood return. -Today we accessed her port which gives blood return.  She is seeing a new rheumatologist at [facility].  3.  Family history: -Mother had breast cancer 3 years ago.  Sister was diagnosed with breast cancer. -We did genetic testing which was negative.    Orders placed this encounter:  No orders of the defined types were placed in this encounter.  Total time spent is 30 minutes with more than 50% of the time spent face-to-face and reviewing outside hospital records, counseling and coordination of care.   Derek Jack, MD [place] [phone]

## 2019-08-18 NOTE — Patient Instructions (Signed)
[place] at [facility] Discharge Instructions  You were seen today by Dr. Delton Coombes. He went over your recent lab and scan results. He will send in a new prescription of Cipro 500mg  twice a day for 5 days to your pharmacy for the UTI. He will see you back as scheduled for labs and follow up.   Thank you for choosing [place] at [facility] to provide your oncology and hematology care.  To afford each patient quality time with our provider, please arrive at least 15 minutes before your scheduled appointment time.   If you have a lab appointment with the [facility] please come in thru the  Main Entrance and check in at the main information desk  You need to re-schedule your appointment should you arrive 10 or more minutes late.  We strive to give you quality time with our providers, and arriving late affects you and other patients whose appointments are after yours.  Also, if you no show three or more times for appointments you may be dismissed from the clinic at the providers discretion.     Again, thank you for choosing [facility]  Our hope is that these requests will decrease the amount of time that you wait before being seen by our physicians.       _____________________________________________________________  Should you have questions after your visit to [facility], please contact our office at [phone] between the hours of 8:00 a.m. and 4:30 p.m.  Voicemails left after 4:00 p.m. will not be returned until the following business day.  For prescription refill requests, have your pharmacy contact our office and allow 72 hours.    Cancer Center Support Programs:   > Cancer Support Group  2nd Tuesday of the month 1pm-2pm, Journey Room

## 2019-08-18 NOTE — Progress Notes (Signed)
Patient stated Home Health was having difficulties with the port and receiving blood return for steroid infusion for her MS.  Ok to access patients port to check blood flow verbal order Dr. Delton Coombes.    Patients port accessed without difficulty.  Good blood return noted and flushed easily.  No complaints of pain with flush.  No bruising or swelling noted at site.  Dr. Delton Coombes in room with flush and no further orders received.  Band aid applied.  Patient left in satisfactory condition with family at side.

## 2019-08-18 NOTE — Telephone Encounter (Signed)
Telephone call back to [place]. Advised Dr. Tomi Likens okay a new line to be added.  Per [facility] Pt in the hospital at [facility], Will send order over to have the [employer] evaluated as well.

## 2019-08-18 NOTE — Assessment & Plan Note (Signed)
1.  Anal squamous cell carcinoma, p16 positive: -PET scan on 01/15/2019 did not show any evidence of metastatic disease. -XRT with 5-FU and mitomycin from 02/03/2019 through 03/03/2019. -PET scan on 06/02/2019 showed uptake at the anorectal junction.  No pelvic hypermetabolism. -Sigmoidoscopy on 06/25/2019 by Dr. Arnoldo Morale did not reveal any abnormalities. -Recently had episodes of black stools.  She reported taking Pepto-Bismol.  She went to the ER at [facility]. -I have obtained and reviewed CT abdomen and pelvis from 08/12/2019 which showed small amount of mural thickening towards the anal verge.  No other abnormality in the abdomen or pelvis.  No adenopathy.  Presacral neurostimulator present. -I have also reviewed her recent labs.  LFTs, LDH and CBC are normal. -She is scheduled for PET scan in June.  We will see her back after that.  2.  Multiple sclerosis: -Several years duration, resulting in right-sided weakness, fatigue, memory issues and headaches.  She walks with a cane/walker. -Treated with multiple agents including Tecfidera, Betaseron, Avonex, Copaxone.  Also received mitoxantrone in the past. -Ocrevus is currently stopped.  She recently had a flareup.  She was treated with 3/5 days of high-dose steroids.  She could not receive further IV steroids secondary to port not giving blood return. -Today we accessed her port which gives blood return.  She is seeing a new rheumatologist at [facility].  3.  Family history: -Mother had breast cancer 3 years ago.  Sister was diagnosed with breast cancer. -We did genetic testing which was negative.

## 2019-09-04 ENCOUNTER — Telehealth (INDEPENDENT_AMBULATORY_CARE_PROVIDER_SITE_OTHER): Payer: Medicare HMO | Admitting: Family Medicine

## 2019-09-04 DIAGNOSIS — R600 Localized edema: Secondary | ICD-10-CM | POA: Diagnosis not present

## 2019-09-04 DIAGNOSIS — G35 Multiple sclerosis: Secondary | ICD-10-CM | POA: Diagnosis not present

## 2019-09-04 DIAGNOSIS — C210 Malignant neoplasm of anus, unspecified: Secondary | ICD-10-CM | POA: Diagnosis not present

## 2019-09-04 MED ORDER — FLUOCINONIDE-E 0.05 % EX CREA: 1.0000 "application " | TOPICAL_CREAM | Freq: Two times a day (BID) | CUTANEOUS | 5 refills | Status: DC

## 2019-09-08 ENCOUNTER — Encounter: Payer: Self-pay | Admitting: Family Medicine

## 2019-09-08 NOTE — Progress Notes (Signed)
Subjective:    Patient ID: Amber Williams, female    DOB: 1976/01/18, 44 y.o.   MRN: [id number]   HPI: Amber Williams is a 44 y.o. female presenting for swelling in both feet for about a week and a half.  There is some discomfort in the left foot as well.  There is no associated dyspnea or chest pain.  She has trouble elevating her feet due to her multiple sclerosis.  The swelling is also exacerbated by her recent treatment for anal carcinoma.  she also has to sit up at a desk to do her schoolwork.  She cannot elevate above the level of her heart conveniently.   Depression screen [facility] 2/9 08/07/2019 05/20/2019 04/01/2019 03/13/2019 12/04/2018  Decreased Interest 0 0 1 0 0  Down, Depressed, Hopeless 0 0 1 1 0  PHQ - 2 Score 0 0 2 1 0  Altered sleeping - 1 0 - -  Tired, decreased energy - 0 2 - -  Change in appetite - 0 0 - -  Feeling bad or failure about yourself  - 0 0 - -  Trouble concentrating - 0 0 - -  Moving slowly or fidgety/restless - 0 1 - -  Suicidal thoughts - 0 0 - -  PHQ-9 Score - 1 5 - -  Difficult doing work/chores - - Somewhat difficult - -  Some recent data might be hidden     Relevant past medical, surgical, family and social history reviewed and updated as indicated.  Interim medical history since our last visit reviewed. Allergies and medications reviewed and updated.  ROS:  Review of Systems  Constitutional: Negative.   HENT: Negative for congestion.   Eyes: Negative for visual disturbance.  Respiratory: Negative for shortness of breath.   Cardiovascular: Positive for leg swelling. Negative for chest pain.  Gastrointestinal: Negative for abdominal pain, constipation, diarrhea, nausea and vomiting.  Genitourinary: Negative for difficulty urinating.  Musculoskeletal: Positive for arthralgias and myalgias.  Neurological: Positive for weakness. Negative for headaches.     Social History   Tobacco Use  Smoking Status Former Smoker  . Packs/day: 1.50    . Years: 15.00  . Pack years: 22.50  . Types: Cigarettes  . Start date: 4  . Quit date: 01/30/2009  . Years since quitting: 10.6  Smokeless Tobacco Never Used       Objective:     Wt Readings from Last 3 Encounters:  08/18/19 177 lb 3.2 oz (80.4 kg)  08/07/19 177 lb 12.8 oz (80.6 kg)  06/24/19 188 lb (85.3 kg)     Exam deferred. Pt. Harboring due to COVID 19. Phone visit performed.   Assessment & Plan:   1. Edema leg   2. Multiple sclerosis ([place])   3. Anal cancer ([place])     Meds ordered this encounter  Medications  . fluocinonide-emollient (LIDEX-E) 0.05 % cream    Sig: Apply 1 application topically 2 (two) times daily. To affected areas    Dispense:  60 g    Refill:  5    No orders of the defined types were placed in this encounter.     Diagnoses and all orders for this visit:  Edema leg  Multiple sclerosis ([place])  Anal cancer ([place])  Other orders -     fluocinonide-emollient (LIDEX-E) 0.05 % cream; Apply 1 application topically 2 (two) times daily. To affected areas  She was told that she would have to find a way to elevate her  legs or she would have to live with the swelling which might get worse.  I am concerned with the complications of her various conditions at that she could easily become dehydrated with excessive diuresis.  That would simply make a complex problem worse.  She will work on ways to elevate her feet but it will be easy she tells me.  Virtual Visit via telephone Note  I discussed the limitations, risks, security and privacy concerns of performing an evaluation and management service by telephone and the availability of in person appointments. The patient was identified with two identifiers. Pt.expressed understanding and agreed to proceed. Pt. Is at home. Dr. Livia Snellen is in his office.  Follow Up Instructions:   I discussed the assessment and treatment plan with the patient. The patient was provided an opportunity to ask questions and all  were answered. The patient agreed with the plan and demonstrated an understanding of the instructions.   The patient was advised to call back or seek an in-person evaluation if the symptoms worsen or if the condition fails to improve as anticipated.   Total minutes including chart review and phone contact time: 17   Follow up plan: No follow-ups on file.  [name], MD [place]

## 2019-09-10 ENCOUNTER — Ambulatory Visit: Payer: Self-pay | Admitting: Licensed Clinical Social Worker

## 2019-09-10 DIAGNOSIS — F331 Major depressive disorder, recurrent, moderate: Secondary | ICD-10-CM

## 2019-09-10 DIAGNOSIS — E039 Hypothyroidism, unspecified: Secondary | ICD-10-CM

## 2019-09-10 DIAGNOSIS — F4310 Post-traumatic stress disorder, unspecified: Secondary | ICD-10-CM

## 2019-09-10 DIAGNOSIS — G35 Multiple sclerosis: Secondary | ICD-10-CM

## 2019-09-10 NOTE — Chronic Care Management (AMB) (Signed)
Chronic Care Management    Clinical Social Work Follow Up Note  09/10/2019 Name: Amber Williams MRN: [id number] DOB: 1975-12-25  Amber Williams is a 44 y.o. year old female who is a primary care patient of [name], [name], MD. The CCM team was consulted for assistance with [employer] .   Review of patient status, including review of consultants reports, other relevant assessments, and collaboration with appropriate care team members and the patient's provider was performed as part of comprehensive patient evaluation and provision of chronic care management services.    SDOH (Social Determinants of Health) assessments performed: No;risk for tobacco use; risk for depression    Office Visit from 05/20/2019 in [place]  PHQ-9 Total Score  1       GAD 7 : Generalized Anxiety Score 04/01/2019  Nervous, Anxious, on Edge 1  Control/stop worrying 0  Worry too much - different things 0  Trouble relaxing 1  Restless 0  Easily annoyed or irritable 1  Afraid - awful might happen 0  Total GAD 7 Score 3  Anxiety Difficulty Somewhat difficult    Outpatient Encounter Medications as of 09/10/2019  Medication Sig  . ALLERGY RELIEF 180 MG tablet Take 180 mg by mouth daily as needed for allergies.   [name] amitriptyline (ELAVIL) 50 MG tablet Take 1 tablet (50 mg total) by mouth at bedtime.  [name] CATHFLO ACTIVASE 2 MG injection   . cephALEXin (KEFLEX) 500 MG capsule Take 500 mg by mouth 2 (two) times daily.  . Cholecalciferol (VITAMIN D3) 5000 units CAPS Take 5,000 Units by mouth every morning.  . escitalopram (LEXAPRO) 10 MG tablet Take 1 tablet (10 mg total) by mouth daily.  . fluocinonide-emollient (LIDEX-E) 0.05 % cream Apply 1 application topically 2 (two) times daily. To affected areas  . flurazepam (DALMANE) 30 MG capsule Take 1 capsule (30 mg total) by mouth at bedtime.  [name] levonorgestrel-ethinyl estradiol (NORDETTE) 0.15-30 MG-MCG tablet Take 1 tablet by mouth  daily.  [name] levothyroxine (SYNTHROID) 125 MCG tablet TAKE 1 TABLET BY MOUTH DAILY BEFORE BREAKFAST  . loperamide (IMODIUM) 1 MG/5ML solution Take 6 mg by mouth as needed for diarrhea or loose stools.   . mometasone (NASONEX) 50 MCG/ACT nasal spray Place 2 sprays into the nose 2 (two) times daily as needed (allergies).   Wess Botts (OCREVUS IV) Inject into the vein every 6 (six) months.   [name] omeprazole (PRILOSEC) 20 MG capsule Take 1 capsule (20 mg total) by mouth daily.  . ondansetron (ZOFRAN-ODT) 4 MG disintegrating tablet Take 4 mg by mouth every 8 (eight) hours as needed.  . traMADol (ULTRAM) 50 MG tablet Take 0.5 tablets (25 mg total) by mouth every 6 (six) hours as needed. (Patient not taking: Reported on 08/18/2019)  . traZODone (DESYREL) 150 MG tablet Use from 1/3 to 1 tablet nightly as needed for sleep. (Patient not taking: Reported on 08/18/2019)  . vitamin B-12 (CYANOCOBALAMIN) 500 MCG tablet Take 500 mcg by mouth daily.   No facility-administered encounter medications on file as of 09/10/2019.     LCSW called home phone number of client today and was not able to speak with client via phone today. LCSW did leave phone message for [facility] today requesting that she please return call to LCSW at [phone].  Follow Up Plan: LCSW to call client in next 4 weeks to talk with client about managing anxiety and stress issues of client  [name].Victoire Deans MSW, LCSW Licensed Holiday representative [facility] Care Management [phone]

## 2019-09-10 NOTE — Patient Instructions (Addendum)
Licensed Clinical Social Worker Visit Information  Materials Provided: No  09/10/2019   Name: ANGELIZ AUG MRN: [id number] DOB: 11/14/1975   FARRON BERKSON is a 44 y.o. year old female who is a primary care patient of [name], [name], MD. The CCM team was consulted for assistance with [employer] .   Review of patient status, including review of consultants reports, other relevant assessments, and collaboration with appropriate care team members and the patient's provider was performed as part of comprehensive patient evaluation and provision of chronic care management services.   SDOH (Social Determinants of Health) assessments performed: No;risk for tobacco use; risk for depression    LCSW called home phone number of client today and was not able to speak with client via phone today. LCSW did leave phone message for [facility] today requesting that she please return call to LCSW at [phone].  Follow Up Plan:LCSW to call client in next 4 weeks to talk with client about managing anxiety and stress issues of client  LCSW was not able to speak via phone with client today. Thus, the patient was not able to verbalize understanding of instructions provided today and was not able to accept or  decline a print copy of patient instruction materials.   [name].Cedric Mcclaine MSW, LCSW Licensed Clinical Social Worker [facility] Care Management [phone]

## 2019-09-15 ENCOUNTER — Other Ambulatory Visit: Payer: Self-pay | Admitting: Family Medicine

## 2019-09-17 ENCOUNTER — Telehealth: Payer: Self-pay | Admitting: *Deleted

## 2019-09-17 NOTE — Telephone Encounter (Signed)
Pa approved. This approval authorizes your coverage from 05/02/2019 - 04/30/2020. Pharmacy and patient aware.

## 2019-09-17 NOTE — Telephone Encounter (Signed)
Can you try a prior authorization? I work with her for several months on various medications before we had on this when that works for her. Thanks.

## 2019-09-17 NOTE — Telephone Encounter (Signed)
PA in process for flurazepam HCl 30mg  Key: [id number]  Your information has been submitted to [place] Medicare Part D. [employer] will review the request and will issue a decision, typically within 1-3 days from your submission. You can check the updated outcome later by reopening this request.  If [employer] has not responded in 1-3 days or if you have any questions about your ePA request, please contact [place] Medicare Part D at [phone]. If you think there may be a problem with your PA request, use our live chat feature at the bottom right.

## 2019-09-17 NOTE — Telephone Encounter (Signed)
Fax from [facility] Flurazepam 30 mg Non-formulary, may try Diazepam or another covered alternative Please advise

## 2019-09-20 ENCOUNTER — Encounter (HOSPITAL_COMMUNITY): Payer: Self-pay

## 2019-09-25 ENCOUNTER — Other Ambulatory Visit: Payer: Self-pay | Admitting: Family Medicine

## 2019-09-26 ENCOUNTER — Ambulatory Visit (INDEPENDENT_AMBULATORY_CARE_PROVIDER_SITE_OTHER): Payer: Medicare HMO | Admitting: Licensed Clinical Social Worker

## 2019-09-26 DIAGNOSIS — E039 Hypothyroidism, unspecified: Secondary | ICD-10-CM

## 2019-09-26 DIAGNOSIS — F331 Major depressive disorder, recurrent, moderate: Secondary | ICD-10-CM | POA: Diagnosis not present

## 2019-09-26 DIAGNOSIS — F4310 Post-traumatic stress disorder, unspecified: Secondary | ICD-10-CM

## 2019-09-26 DIAGNOSIS — G35 Multiple sclerosis: Secondary | ICD-10-CM

## 2019-09-26 NOTE — Chronic Care Management (AMB) (Signed)
Chronic Care Management    Clinical Social Work Follow Up Note  09/26/2019 Name: Amber Williams MRN: [id number] DOB: 08/13/1975  Amber Williams is a 44 y.o. year old female who is a primary care patient of [name], [name], MD. The CCM team was consulted for assistance with [employer] .   Review of patient status, including review of consultants reports, other relevant assessments, and collaboration with appropriate care team members and the patient's provider was performed as part of comprehensive patient evaluation and provision of chronic care management services.    SDOH (Social Determinants of Health) assessments performed: Yes; risk for tobacco use; risk for depression    Office Visit from 05/20/2019 in [place]  PHQ-9 Total Score  1     GAD 7 : Generalized Anxiety Score 04/01/2019  Nervous, Anxious, on Edge 1  Control/stop worrying 0  Worry too much - different things 0  Trouble relaxing 1  Restless 0  Easily annoyed or irritable 1  Afraid - awful might happen 0  Total GAD 7 Score 3  Anxiety Difficulty Somewhat difficult    Outpatient Encounter Medications as of 09/26/2019  Medication Sig  . omeprazole (PRILOSEC) 20 MG capsule TAKE 1 CAPSULE DAILY  . ALLERGY RELIEF 180 MG tablet Take 180 mg by mouth daily as needed for allergies.   [name] amitriptyline (ELAVIL) 50 MG tablet Take 1 tablet (50 mg total) by mouth at bedtime.  [name] CATHFLO ACTIVASE 2 MG injection   . cephALEXin (KEFLEX) 500 MG capsule Take 500 mg by mouth 2 (two) times daily.  . Cholecalciferol (VITAMIN D3) 5000 units CAPS Take 5,000 Units by mouth every morning.  . escitalopram (LEXAPRO) 10 MG tablet Take 1 tablet (10 mg total) by mouth daily.  . fluocinonide-emollient (LIDEX-E) 0.05 % cream Apply 1 application topically 2 (two) times daily. To affected areas  . flurazepam (DALMANE) 30 MG capsule TAKE 1 CAPSULE BY MOUTH AT BEDTIME  . levonorgestrel-ethinyl estradiol (NORDETTE)  0.15-30 MG-MCG tablet Take 1 tablet by mouth daily.  [name] levothyroxine (SYNTHROID) 125 MCG tablet TAKE 1 TABLET BY MOUTH DAILY BEFORE BREAKFAST  . loperamide (IMODIUM) 1 MG/5ML solution Take 6 mg by mouth as needed for diarrhea or loose stools.   . mometasone (NASONEX) 50 MCG/ACT nasal spray Place 2 sprays into the nose 2 (two) times daily as needed (allergies).   Wess Botts (OCREVUS IV) Inject into the vein every 6 (six) months.   . ondansetron (ZOFRAN-ODT) 4 MG disintegrating tablet Take 4 mg by mouth every 8 (eight) hours as needed.  . traMADol (ULTRAM) 50 MG tablet Take 0.5 tablets (25 mg total) by mouth every 6 (six) hours as needed. (Patient not taking: Reported on 08/18/2019)  . traZODone (DESYREL) 150 MG tablet Use from 1/3 to 1 tablet nightly as needed for sleep. (Patient not taking: Reported on 08/18/2019)  . vitamin B-12 (CYANOCOBALAMIN) 500 MCG tablet Take 500 mcg by mouth daily.   No facility-administered encounter medications on file as of 09/26/2019.    Goals    . Client will talk with LCSW in next 3 weeks to discuss stress/anxiety management for client (pt-stated)     Current Barriers:  . Client has MS (multiple sclerosis) and has Chronic Diagnoses also of Depression, PTSD, and Hypothyroidism . Client has decreased energy . Decreased appetite  Clinical Social Work Clinical Goal(s):  [name] LCSW will talk with client in next 3 weeks about stress/anxiety management for client  Interventions: . Talked with client about  pain issues of client . Talked with client about CCM program services . Encouraged client to talk with RNCM to discuss nursing needs of client . Talked with [facility] about her use of food stamps benefit . Talked with [place] about health needs of her sister . Talked with [place] about health needs of her mother . Talked with client about radiation treatments of client . Talked with client about exercise of client (going to gym to exercise several days weekly) . Talked  with client about transport needs of client . Talked with client about upcoming client medical appointments . Talked with client about ambulation needs of client . Talked with client about vision of client  Talked with [facility] about social support network  Patient Self Care Activities:   Attends classes, as able a, at [employer] medications as prescribed.  Plan:  Client to attend scheduled client medical appointments Client to communicate with RNCM to discuss nursing needs of client LCSW to call client in next 4 weeks to talk with her about managing anxiety and stress issues of client  Initial goal documentation        Follow Up Plan: LCSW to call client in next 4 weeks to talk with her about managing anxiety and stress issues faced  [name].Olamide Lahaie MSW, LCSW Licensed Clinical Social Worker [facility] Care Management [phone]

## 2019-09-26 NOTE — Patient Instructions (Addendum)
Licensed Clinical Social Worker Visit Information  Goals we discussed today:  Goals    . Client will talk with LCSW in next 3 weeks to discuss stress/anxiety management for client (pt-stated)     Current Barriers:  . Client has MS (multiple sclerosis) and has Chronic Diagnoses also of Depression, PTSD, and Hypothyroidism . Client has decreased energy . Decreased appetite  Clinical Social Work Clinical Goal(s):  [name] LCSW will talk with client in next 3 weeks about stress/anxiety management for client  Interventions: Talked with client about pain issues of client  Talked with client about CCM program services  Encouraged client to talk with RNCM to discuss nursing needs of client  Talked with [facility] about her use of food stamps benefit  Talked with [facility] about health needs of her sister  Talked with [place] about health needs of her mother  Talked with client about radiation treatments of client  Talked with client about exercise of client (going to gym to exercise several days weekly)  Talked with client about transport needs of client  Talked with client about upcoming client medical appointments  Talked with client about ambulation needs of client  Talked with client about vision of client Talked with [place] about social support network  Patient Self Care Activities:   Attends classes, as able a, at [facility] medications as prescribed.   Plan:  Client to attend scheduled client medical appointments Client to communicate with RNCM to discuss nursing needs of client LCSW to call client in next 4 weeks to talk with her about managing anxiety and stress issues of client  Initial goal documentation     Materials Provided: No  Follow Up Plan:  LCSW to call client in next 4 weeks to talk with her about managing anxiety and stress issues of client  The patient verbalized understanding of instructions provided today and declined a print copy of patient  instruction materials.   [name].Dalen [place] MSW, LCSW Licensed Clinical Social Worker [facility] Care Management [phone]

## 2019-10-05 ENCOUNTER — Other Ambulatory Visit: Payer: Self-pay | Admitting: Family Medicine

## 2019-10-06 ENCOUNTER — Other Ambulatory Visit: Payer: Self-pay

## 2019-10-06 ENCOUNTER — Encounter (HOSPITAL_COMMUNITY)
Admission: RE | Admit: 2019-10-06 | Discharge: 2019-10-06 | Disposition: A | Discharge status: Home / Self Care | Payer: Medicare HMO | Source: Ambulatory Visit | Attending: Hematology | Admitting: Hematology

## 2019-10-06 DIAGNOSIS — C210 Malignant neoplasm of anus, unspecified: Secondary | ICD-10-CM | POA: Insufficient documentation

## 2019-10-06 MED ORDER — FLUDEOXYGLUCOSE F - 18 (FDG) INJECTION
9.7000 | Freq: Once | INTRAVENOUS | Status: AC | PRN
  Administered 2019-10-06: 9.7 via INTRAVENOUS

## 2019-10-08 ENCOUNTER — Inpatient Hospital Stay (HOSPITAL_COMMUNITY): Payer: Medicare HMO | Attending: Genetic Counselor | Admitting: Hematology

## 2019-10-08 VITALS — BP 121/66 | HR 90 | Temp 98.3°F | Resp 18 | Wt 185.8 lb

## 2019-10-08 DIAGNOSIS — C210 Malignant neoplasm of anus, unspecified: Secondary | ICD-10-CM | POA: Diagnosis not present

## 2019-10-08 DIAGNOSIS — G35 Multiple sclerosis: Secondary | ICD-10-CM | POA: Diagnosis not present

## 2019-10-08 DIAGNOSIS — N6332 Unspecified lump in axillary tail of the left breast: Secondary | ICD-10-CM | POA: Diagnosis not present

## 2019-10-08 DIAGNOSIS — Z9884 Bariatric surgery status: Secondary | ICD-10-CM | POA: Diagnosis not present

## 2019-10-08 DIAGNOSIS — Z87891 Personal history of nicotine dependence: Secondary | ICD-10-CM | POA: Insufficient documentation

## 2019-10-08 DIAGNOSIS — Z85048 Personal history of other malignant neoplasm of rectum, rectosigmoid junction, and anus: Secondary | ICD-10-CM | POA: Diagnosis not present

## 2019-10-08 DIAGNOSIS — Z803 Family history of malignant neoplasm of breast: Secondary | ICD-10-CM | POA: Insufficient documentation

## 2019-10-08 DIAGNOSIS — M6289 Other specified disorders of muscle: Secondary | ICD-10-CM | POA: Diagnosis not present

## 2019-10-08 DIAGNOSIS — Z809 Family history of malignant neoplasm, unspecified: Secondary | ICD-10-CM | POA: Insufficient documentation

## 2019-10-08 MED ORDER — FUROSEMIDE 20 MG PO TABS: 20.0000 mg | ORAL_TABLET | Freq: Every day | ORAL | 1 refills | Status: DC | PRN

## 2019-10-08 NOTE — Progress Notes (Signed)
[place], [place] [address]   CLINIC:  Medical Oncology/Hematology  PCP:  [name], MD [place] / [place] [address] [phone]   REASON FOR VISIT:  Follow-up for anal cancer  PRIOR THERAPY: Aloxi, Mutamycin, Adrucil  CURRENT THERAPY: Observation  BRIEF ONCOLOGIC HISTORY:  Oncology History  Anal cancer ([place])  01/10/2019 Initial Diagnosis   Anal cancer ([place])   01/16/2019 Cancer Staging   Staging form: Anus, AJCC 8th Edition - Clinical: Stage Unknown (cTX, cN0, cM0) - Signed by Derek Jack, MD on 01/16/2019   02/03/2019 -  Chemotherapy   The patient had palonosetron (ALOXI) injection 0.25 mg, 0.25 mg, Intravenous,  Once, 1 of 1 cycle Administration: 0.25 mg (02/03/2019), 0.25 mg (03/03/2019) mitoMYcin (MUTAMYCIN) chemo injection 20 mg, 10.1 mg/m2 = 19.5 mg, Intravenous,  Once, 1 of 1 cycle Administration: 20 mg (02/03/2019), 20 mg (03/03/2019) fluorouracil (ADRUCIL) 7,900 mg in sodium chloride 0.9 % 92 mL chemo infusion, 1,000 mg/m2/day = 7,900 mg, Intravenous, 4D (96 hours ), 1 of 1 cycle Administration: 7,900 mg (02/03/2019), 7,900 mg (03/03/2019)  for chemotherapy treatment.    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  The Common Hereditary Gene Panel offered by [facility] includes sequencing and/or deletion duplication testing of the following 48 genes: APC, ATM, AXIN2, BARD1, BMPR1A, BRCA1, BRCA2, BRIP1, CDH1, CDK4, CDKN2A (p14ARF), CDKN2A (p16INK4a), CHEK2, CTNNA1, DICER1, EPCAM (Deletion/duplication testing only), GREM1 (promoter region deletion/duplication testing only), KIT, MEN1, MLH1, MSH2, MSH3, MSH6, MUTYH, NBN, NF1, NHTL1, PALB2, PDGFRA, PMS2, POLD1, POLE, PTEN, RAD50, RAD51C, RAD51D, RNF43, SDHB, SDHC, SDHD, SMAD4, SMARCA4. STK11, TP53, TSC1, TSC2, and VHL.  The following genes were evaluated for sequence  changes only: SDHA and HOXB13 c.251G>A variant only. The report date is 08/07/2019.     CANCER STAGING: Cancer Staging Anal cancer ([place]) Staging form: Anus, AJCC 8th Edition - Clinical: Stage Unknown (cTX, cN0, cM0) - Signed by Derek Jack, MD on 01/16/2019   INTERVAL HISTORY:  [name], a 44 y.o. female, returns for routine follow-up of her anal cancer. Amber Williams was last seen on 08/18/2019.  Today Amber Williams reports L leg weakness which prevents her from climbing the stairs. Amber Williams has a bit of blood when Amber Williams has a BM. Her last sigmoidoscopy was on 06/25/2019 but Amber Williams wasn't bleeding at that time. Amber Williams takes stool softeners but not every day, and Amber Williams still has difficult BMs even though the stool is soft. Both of her lower legs have been swollen for the past month. Amber Williams has noticed a swollen cyst in her L axilla for the last month.   REVIEW OF SYSTEMS:  Review of Systems  Constitutional: Positive for appetite change (mildly decreased) and fatigue (mild).  Cardiovascular: Positive for leg swelling.  Gastrointestinal: Positive for blood in stool and constipation.  All other systems reviewed and are negative.   PAST MEDICAL/SURGICAL HISTORY:  Past Medical History:  Diagnosis Date  . Allergy   . Anal cancer ([place]) 01/10/2019  . Arthritis   . Blood transfusion without reported diagnosis    2013  . Depression   . DVT (deep venous thrombosis) ([place]) 2013  . Family history of breast cancer   . Gait abnormality 08/03/2016  . Hyperlipidemia   . Hypertension   . Hypothyroidism   . Multiple sclerosis ([place])   . Neurogenic bladder 08/03/2016  . Neuromuscular disorder ([place])    dx'd  with MS at age of 8  . Port-A-Cath in place 01/23/2019  . PTSD (post-traumatic stress disorder)   . Right foot drop 08/03/2016  . Thyroid disease   . Ulcer    Past Surgical History:  Procedure Laterality Date  . COLON SURGERY     Colonoscopy  . FLEXIBLE SIGMOIDOSCOPY N/A 06/25/2019   Procedure: FLEXIBLE  SIGMOIDOSCOPY ANOSCOPY;  Surgeon: Aviva Signs, MD;  Location: [facility];  Service: General;  Laterality: N/A;  . GASTRIC BYPASS  2010  . HEMORRHOID SURGERY N/A 12/25/2018   Procedure: EXTENSIVE HEMORRHOIDECTOMY;  Surgeon: Aviva Signs, MD;  Location: [facility];  Service: General;  Laterality: N/A;  . INTERSTIM IMPLANT PLACEMENT  2014  . PORTACATH PLACEMENT      SOCIAL HISTORY:  Social History   Socioeconomic History  . Marital status: Single    Spouse name: Ricky  . Number of children: 0  . Years of education: 45  . Highest education level: Associate degree: occupational, Hotel manager, or vocational program  Occupational History  . Occupation: Disability  Tobacco Use  . Smoking status: Former Smoker    Packs/day: 1.50    Years: 15.00    Pack years: 22.50    Types: Cigarettes    Start date: 1993    Quit date: 01/30/2009    Years since quitting: 10.6  . Smokeless tobacco: Never Used  Substance and Sexual Activity  . 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 Drug use: No  . Sexual activity: Yes  Other Topics Concern  . Not on file  Social History Narrative   Lives at home with her boyfriend   Right-handed   Caffeine: 2 glasses per day   One story home   Social Determinants of Health   Financial Resource Strain:   . Difficulty of Paying Living Expenses:   Food Insecurity:   . Worried About Charity fundraiser in the Last Year:   . Arboriculturist in the Last Year:   Transportation Needs:   . Film/video editor (Medical):   [name] Lack of Transportation (Non-Medical):   Physical Activity:   . Days of Exercise per Week:   . Minutes of Exercise per Session:   Stress:   . Feeling of Stress :   Social Connections: Unknown  . Frequency of Communication with Friends and Family: More than three times a week  . Frequency of Social Gatherings with Friends and Family: More than three times a week  . Attends Religious Services: Not on file  . Active Member of Clubs  or Organizations: Not on file  . Attends Archivist Meetings: Not on file  . Marital Status: Living with partner  Intimate Partner Violence:   . Fear of Current or Ex-Partner:   . Emotionally Abused:   [name] Physically Abused:   . Sexually Abused:     FAMILY HISTORY:  Family History  Problem Relation Age of Onset  . Breast cancer Mother 41  . Hypertension Father   . Diabetes Father   . Hypertension Sister   . Miscarriages / Stillbirths Sister   . Breast cancer Sister 51  . Heart disease Maternal Grandmother   . Diabetes Maternal Grandmother   . Heart disease Maternal Grandfather   . Diabetes Paternal Grandmother   . Cancer Paternal Grandmother        unknown form of cancer  . Heart disease Paternal Grandfather   . Stroke Paternal Grandfather  CURRENT MEDICATIONS:  Current Outpatient Medications  Medication Sig Dispense Refill  . omeprazole (PRILOSEC) 20 MG capsule TAKE 1 CAPSULE DAILY 90 capsule 0  . ALLERGY RELIEF 180 MG tablet Take 180 mg by mouth daily as needed for allergies.     [name] amitriptyline (ELAVIL) 50 MG tablet Take 1 tablet (50 mg total) by mouth at bedtime. 90 tablet 3  . CATHFLO ACTIVASE 2 MG injection     . cephALEXin (KEFLEX) 500 MG capsule Take 500 mg by mouth 2 (two) times daily.    . Cholecalciferol (VITAMIN D3) 5000 units CAPS Take 5,000 Units by mouth every morning.    . escitalopram (LEXAPRO) 10 MG tablet TAKE 1 TABLET DAILY 90 tablet 0  . fluocinonide-emollient (LIDEX-E) 0.05 % cream Apply 1 application topically 2 (two) times daily. To affected areas 60 g 5  . flurazepam (DALMANE) 30 MG capsule TAKE 1 CAPSULE BY MOUTH AT BEDTIME 30 capsule 2  . levonorgestrel-ethinyl estradiol (NORDETTE) 0.15-30 MG-MCG tablet Take 1 tablet by mouth daily. 1 Package 11  . levothyroxine (SYNTHROID) 125 MCG tablet TAKE 1 TABLET BY MOUTH DAILY BEFORE BREAKFAST 90 tablet 0  . loperamide (IMODIUM) 1 MG/5ML solution Take 6 mg by mouth as needed for diarrhea or loose  stools.     . mometasone (NASONEX) 50 MCG/ACT nasal spray Place 2 sprays into the nose 2 (two) times daily as needed (allergies).     Wess Botts (OCREVUS IV) Inject into the vein every 6 (six) months.     . ondansetron (ZOFRAN-ODT) 4 MG disintegrating tablet Take 4 mg by mouth every 8 (eight) hours as needed.    . traMADol (ULTRAM) 50 MG tablet Take 0.5 tablets (25 mg total) by mouth every 6 (six) hours as needed. (Patient not taking: Reported on 08/18/2019) 30 tablet 0  . traZODone (DESYREL) 150 MG tablet Use from 1/3 to 1 tablet nightly as needed for sleep. (Patient not taking: Reported on 08/18/2019) 90 tablet 1  . vitamin B-12 (CYANOCOBALAMIN) 500 MCG tablet Take 500 mcg by mouth daily.     No current facility-administered medications for this visit.    ALLERGIES:  Allergies  Allergen Reactions  . Other Hives    Plastic Tape  . Tecfidera [Dimethyl Fumarate] Itching    PHYSICAL EXAM:  Performance status (ECOG): 1 - Symptomatic but completely ambulatory  There were no vitals filed for this visit. Wt Readings from Last 3 Encounters:  08/18/19 177 lb 3.2 oz (80.4 kg)  08/07/19 177 lb 12.8 oz (80.6 kg)  06/24/19 188 lb (85.3 kg)   Physical Exam Vitals reviewed.  Constitutional:      Appearance: Normal appearance.  Cardiovascular:     Rate and Rhythm: Normal rate and regular rhythm.     Pulses: Normal pulses.     Heart sounds: Normal heart sounds.  Pulmonary:     Effort: Pulmonary effort is normal.     Breath sounds: Normal breath sounds.  Abdominal:     Palpations: Abdomen is soft.     Tenderness: There is no abdominal tenderness.  Musculoskeletal:     Right lower leg: Edema present.     Left lower leg: Edema present.  Neurological:     General: No focal deficit present.     Mental Status: Amber Williams is alert and oriented to person, place, and time.  Psychiatric:        Mood and Affect: Mood normal.        Behavior: Behavior normal.  LABORATORY DATA:  I have  reviewed the labs as listed.  CBC Latest Ref Rng & Units 08/14/2019 08/07/2019 06/02/2019  WBC 4.0 - 10.5 K/uL 7.6 10.8 6.7  Hemoglobin 12.0 - 15.0 g/dL 13.0 14.1 12.8  Hematocrit 36.0 - 46.0 % 41.1 42.7 40.9  Platelets 150 - 400 K/uL 223 250 234   CMP Latest Ref Rng & Units 08/14/2019 08/07/2019 06/02/2019  Glucose 70 - 99 mg/dL 52(L) 115(H) 82  BUN 6 - 20 mg/dL 9 14 13   Creatinine 0.44 - 1.00 mg/dL 0.85 0.84 0.86  Sodium 135 - 145 mmol/L 140 143 136  Potassium 3.5 - 5.1 mmol/L 4.1 4.0 4.4  Chloride 98 - 111 mmol/L 109 105 104  CO2 22 - 32 mmol/L 24 20 26   Calcium 8.9 - 10.3 mg/dL 9.3 10.2 9.5  Total Protein 6.5 - 8.1 g/dL 5.4(L) 5.7(L) 5.7(L)  Total Bilirubin 0.3 - 1.2 mg/dL 0.4 0.3 0.6  Alkaline Phos 38 - 126 U/L 63 77 67  AST 15 - 41 U/L 14(L) 11 14(L)  ALT 0 - 44 U/L 14 14 12     DIAGNOSTIC IMAGING:  I have independently reviewed the scans and discussed with the patient. NM PET Image Restag (PS) Skull Base To Thigh  Result Date: 10/07/2019 CLINICAL DATA:  Subsequent treatment strategy for anal carcinoma. Approximately 6 months status post radiation therapy. EXAM: NUCLEAR MEDICINE PET SKULL BASE TO THIGH TECHNIQUE: 9.7 mCi F-18 FDG was injected intravenously. Full-ring PET imaging was performed from the skull base to thigh after the radiotracer. CT data was obtained and used for attenuation correction and anatomic localization. Fasting blood glucose: 62 mg/dl COMPARISON:  06/02/2019 FINDINGS: Mediastinal blood-pool activity (background): SUV max = 2.4 Liver activity (reference): SUV max = N/A NECK:  No hypermetabolic lymph nodes or masses. Incidental CT findings:  None. CHEST: No hypermetabolic masses or lymphadenopathy. No suspicious pulmonary nodules seen on CT images. Incidental CT findings:  None. ABDOMEN/PELVIS: Anorectal hypermetabolism shows mild decrease since previous study, with SUV max currently measuring 6.2, compared to 8.6 previously. No hypermetabolic lymph nodes in the pelvis or  abdomen. No abnormal hypermetabolic activity within the liver, pancreas, adrenal glands, or spleen. Incidental CT findings:  Prior gastric bypass surgery again noted. SKELETON: No focal hypermetabolic bone lesions to suggest skeletal metastasis. Incidental CT findings:  None. IMPRESSION: Mild decrease in anorectal hypermetabolic activity since prior study. No evidence of pelvic lymph node or distant metastatic disease. Electronically Signed   By: Marlaine Hind M.D.   On: 10/07/2019 11:18     ASSESSMENT:  1.  Anal squamous cell carcinoma, p16 positive: -XRT and 5-FU and mitomycin from 02/03/2019 through 03/03/2019. -Sigmoidoscopy on 06/25/2019 by Dr. Arnoldo Morale did not reveal any abnormalities. -We reviewed PET scan from 10/06/2019 which showed mild decrease in anorectal hypermetabolic activity since prior study, SUV 6.2 compared to 8.6 previously. No hypermetabolic lymph nodes or metastatic disease.  2. Family history: -Mother had breast cancer 3 years ago. Sister was diagnosed with breast cancer. Genetic testing was negative.   PLAN:  1.  Anal squamous cell carcinoma, p16 positive: -I have reviewed PET scan results with her. I have reviewed her labs which are grossly within normal limits. -We will consider anoscopy again in 6 months. -I will see her back in 4 months for follow-up.  2.  Multiple sclerosis: -Amber Williams is having weakness in her left leg from multiple sclerosis. -Treated with multiple agents in the past including Tecfidera, Betaseron, Avonex, Copaxone and mitoxantrone. -Ocrevus is currently stopped  for the last 1 year. -Amber Williams is awaiting to be seen by new rheumatologist at [facility].    Orders placed this encounter:  No orders of the defined types were placed in this encounter.    Derek Jack, MD [place] [phone]   I, Milinda Antis, am acting as a scribe for Dr. Sanda Linger.  I, Derek Jack MD, have reviewed the above documentation  for accuracy and completeness, and I agree with the above.

## 2019-10-08 NOTE — Patient Instructions (Signed)
[place] at [facility] Discharge Instructions  You were seen today by Dr. Delton Coombes. He went over your recent results. You will be prescribed furosemide for your leg swelling. Dr. Delton Coombes will see you back in 4 months for labs and follow up.   Thank you for choosing [place] at [facility] to provide your oncology and hematology care.  To afford each patient quality time with our provider, please arrive at least 15 minutes before your scheduled appointment time.   If you have a lab appointment with the [facility] please come in thru the Main Entrance and check in at the main information desk  You need to re-schedule your appointment should you arrive 10 or more minutes late.  We strive to give you quality time with our providers, and arriving late affects you and other patients whose appointments are after yours.  Also, if you no show three or more times for appointments you may be dismissed from the clinic at the providers discretion.     Again, thank you for choosing [facility].  Our hope is that these requests will decrease the amount of time that you wait before being seen by our physicians.       _____________________________________________________________  Should you have questions after your visit to [facility], please contact our office at [phone] between the hours of 8:00 a.m. and 4:30 p.m.  Voicemails left after 4:00 p.m. will not be returned until the following business day.  For prescription refill requests, have your pharmacy contact our office and allow 72 hours.    Cancer Center Support Programs:   > Cancer Support Group  2nd Tuesday of the month 1pm-2pm, Journey Room

## 2019-10-15 ENCOUNTER — Other Ambulatory Visit: Payer: Self-pay | Admitting: Family Medicine

## 2019-10-15 DIAGNOSIS — Z1231 Encounter for screening mammogram for malignant neoplasm of breast: Secondary | ICD-10-CM

## 2019-10-29 ENCOUNTER — Ambulatory Visit
Admission: RE | Admit: 2019-10-29 | Discharge: 2019-10-29 | Disposition: A | Discharge status: Home / Self Care | Payer: Medicare HMO | Source: Ambulatory Visit | Attending: Family Medicine | Admitting: Family Medicine

## 2019-10-29 ENCOUNTER — Other Ambulatory Visit: Payer: Self-pay

## 2019-10-29 DIAGNOSIS — Z1231 Encounter for screening mammogram for malignant neoplasm of breast: Secondary | ICD-10-CM

## 2019-10-30 ENCOUNTER — Telehealth: Payer: Self-pay

## 2019-10-31 ENCOUNTER — Other Ambulatory Visit (HOSPITAL_COMMUNITY): Payer: Self-pay | Admitting: Hematology

## 2019-11-10 ENCOUNTER — Other Ambulatory Visit: Payer: Self-pay | Admitting: Family Medicine

## 2019-11-10 DIAGNOSIS — R928 Other abnormal and inconclusive findings on diagnostic imaging of breast: Secondary | ICD-10-CM

## 2019-11-12 ENCOUNTER — Ambulatory Visit
Admission: RE | Admit: 2019-11-12 | Discharge: 2019-11-12 | Disposition: A | Discharge status: Home / Self Care | Payer: Medicare HMO | Source: Ambulatory Visit | Attending: Family Medicine | Admitting: Family Medicine

## 2019-11-12 ENCOUNTER — Other Ambulatory Visit: Payer: Self-pay

## 2019-11-12 DIAGNOSIS — R928 Other abnormal and inconclusive findings on diagnostic imaging of breast: Secondary | ICD-10-CM

## 2019-11-19 ENCOUNTER — Other Ambulatory Visit: Payer: Medicare HMO

## 2019-11-25 ENCOUNTER — Other Ambulatory Visit (HOSPITAL_COMMUNITY): Payer: Self-pay | Admitting: Nurse Practitioner

## 2019-12-03 ENCOUNTER — Ambulatory Visit (INDEPENDENT_AMBULATORY_CARE_PROVIDER_SITE_OTHER): Payer: Medicare HMO | Admitting: Licensed Clinical Social Worker

## 2019-12-03 DIAGNOSIS — F4310 Post-traumatic stress disorder, unspecified: Secondary | ICD-10-CM

## 2019-12-03 DIAGNOSIS — F331 Major depressive disorder, recurrent, moderate: Secondary | ICD-10-CM

## 2019-12-03 DIAGNOSIS — G35 Multiple sclerosis: Secondary | ICD-10-CM

## 2019-12-03 DIAGNOSIS — E039 Hypothyroidism, unspecified: Secondary | ICD-10-CM

## 2019-12-03 NOTE — Patient Instructions (Addendum)
Licensed Clinical Social Worker Visit Information  Goals we discussed today:     Client will talk with LCSW in next 3 weeks to discuss stress/anxiety management for client (pt-stated)         Current Barriers:   Client has MS (multiple sclerosis) and has Chronic Diagnoses also of Depression, PTSD, and Hypothyroidism  Client has decreased energy  Decreased appetite  Clinical Social Work Clinical Goal(s):   LCSW will talk with client in next 3 weeks about stress/anxiety management for client  Interventions:  Talked with client about pain issues of client  Talked with client about CCM program services  Encouraged client to talk with RNCM to discuss nursing needs of client  Talked with [facility] about her use of food stamps benefit  Talked with [facility] about health needs of her  sister  Talked with Erleen about health needs of her mother  Talked with Aza about decreased energy of client  Talked with Drue about her recent appointment with neurologist at [facility]  Talked with [facility] about ambulation (uses a cane to help her walk)  Talked with Ninfa about completion of ADLs  Talked with Texanna about appetite of client  Talked with client about vision of client (she received new glasses recently)  Talked with client about her exercise schedule  Talked with client about treatment for Multiple Sclerosis  Talked with client about transport needs of client  Talked with client about social support (friend Letitia Libra is very supportive)  Talked with Latecia about mood of client (she said she feels her mood is stable but she does get fatigued sometimes and has to rest for a day)  Talked with client about upcoming medical appointments  Patient Self Care Activities:   Attends classes, as able a, at [facility] medications as prescribed.   Plan:  Client to attend scheduled client medical appointments Client to communicate with RNCM to discuss  nursing needs of client LCSW to call client in next 4 weeks to talk with her about managing anxiety and stress issues of client  Initial goal documentation    Follow Up Plan: LCSW to call client in next 4 weeks to talk with client about managing anxiety and stress issues of client  Materials Provided: No  The patient verbalized understanding of instructions provided today and declined a print copy of patient instruction materials.   [name].Dalonte Hardage MSW, LCSW Licensed Clinical Social Worker [facility] Care Management [phone]

## 2019-12-03 NOTE — Chronic Care Management (AMB) (Signed)
Chronic Care Management    Clinical Social Work Follow Up Note  12/03/2019 Name: Amber Williams MRN: [id number] DOB: 1975-11-30  Amber Williams is a 44 y.o. year old female who is a primary care patient of [name], [name], MD. The CCM team was consulted for assistance with [employer] .   Review of patient status, including review of consultants reports, other relevant assessments, and collaboration with appropriate care team members and the patient's provider was performed as part of comprehensive patient evaluation and provision of chronic care management services.    SDOH (Social Determinants of Health) assessments performed: No; risk for tobacco use; risk for depression; risk for stress    Office Visit from 05/20/2019 in [place]  PHQ-9 Total Score 1       GAD 7 : Generalized Anxiety Score 04/01/2019  Nervous, Anxious, on Edge 1  Control/stop worrying 0  Worry too much - different things 0  Trouble relaxing 1  Restless 0  Easily annoyed or irritable 1  Afraid - awful might happen 0  Total GAD 7 Score 3  Anxiety Difficulty Somewhat difficult    Outpatient Encounter Medications as of 12/03/2019  Medication Sig   ALLERGY RELIEF 180 MG tablet Take 180 mg by mouth daily as needed for allergies.    amitriptyline (ELAVIL) 50 MG tablet Take 1 tablet (50 mg total) by mouth at bedtime.   CATHFLO ACTIVASE 2 MG injection    cephALEXin (KEFLEX) 500 MG capsule Take 500 mg by mouth 2 (two) times daily.   Cholecalciferol (VITAMIN D3) 5000 units CAPS Take 5,000 Units by mouth every morning.   escitalopram (LEXAPRO) 10 MG tablet TAKE 1 TABLET DAILY   fluocinonide-emollient (LIDEX-E) 0.05 % cream Apply 1 application topically 2 (two) times daily. To affected areas   flurazepam (DALMANE) 30 MG capsule TAKE 1 CAPSULE BY MOUTH AT BEDTIME   furosemide (LASIX) 20 MG tablet TAKE 1 TABLET BY MOUTH EVERY DAY AS NEEDED   levonorgestrel-ethinyl estradiol  (NORDETTE) 0.15-30 MG-MCG tablet Take 1 tablet by mouth daily.   levothyroxine (SYNTHROID) 125 MCG tablet TAKE 1 TABLET BY MOUTH DAILY BEFORE BREAKFAST   loperamide (IMODIUM) 1 MG/5ML solution Take 6 mg by mouth as needed for diarrhea or loose stools.    mometasone (NASONEX) 50 MCG/ACT nasal spray Place 2 sprays into the nose 2 (two) times daily as needed (allergies).    Ocrelizumab (OCREVUS IV) Inject into the vein every 6 (six) months.    omeprazole (PRILOSEC) 20 MG capsule TAKE 1 CAPSULE DAILY   traZODone (DESYREL) 150 MG tablet Use from 1/3 to 1 tablet nightly as needed for sleep. (Patient not taking: Reported on 10/08/2019)   vitamin B-12 (CYANOCOBALAMIN) 500 MCG tablet Take 500 mcg by mouth daily.   No facility-administered encounter medications on file as of 12/03/2019.     Goals      Client will talk with LCSW in next 3 weeks to discuss stress/anxiety management for client (pt-stated)      Current Barriers:   Client has MS (multiple sclerosis) and has Chronic Diagnoses also of Depression, PTSD, and Hypothyroidism  Client has decreased energy  Decreased appetite  Clinical Social Work Clinical Goal(s):   LCSW will talk with client in next 3 weeks about stress/anxiety management for client  Interventions:  Talked with client about pain issues of client  Talked with client about CCM program services  Encouraged client to talk with RNCM to discuss nursing needs of client  Talked with  [place] about her use of food stamps benefit  Talked with Ameshia about health needs of her  sister  Talked with Samiya about health needs of her mother  Talked with [place] about decreased energy of client  Talked with Eliya about her recent appointment with neurologist at [facility]  Talked with [facility] about ambulation (uses a cane to help her walk)  Talked with Adley about completion of ADLs  Talked with Briunna about appetite of client  Talked with client about vision of client (she  received new glasses recently)  Talked with client about her exercise schedule  Talked with client about treatment for Multiple Sclerosis  Talked with client about transport needs of client  Talked with client about social support (friend Letitia Libra is very supportive)  Talked with Evoleht about mood of client (she said she feels her mood is stable but she does get fatigued sometimes and has to rest for a day)  Talked with client about upcoming medical appointments  Patient Self Care Activities:   Attends classes, as able a, at [place] medications as prescribed.   Plan:  Client to attend scheduled client medical appointments Client to communicate with RNCM to discuss nursing needs of client LCSW to call client in next 4 weeks to talk with her about managing anxiety and stress issues of client  Initial goal documentation    Follow Up Plan: LCSW to call client in next 4 weeks to talk with client about managing anxiety and stress issues of client  [name].Sadiya Durand MSW, LCSW Licensed Clinical Social Worker [facility] Care Management [phone]

## 2019-12-04 ENCOUNTER — Other Ambulatory Visit: Payer: Self-pay | Admitting: *Deleted

## 2019-12-04 ENCOUNTER — Encounter: Payer: Self-pay | Admitting: Family Medicine

## 2019-12-04 MED ORDER — LEVOTHYROXINE SODIUM 125 MCG PO TABS: ORAL_TABLET | ORAL | 0 refills | Status: DC

## 2019-12-14 ENCOUNTER — Other Ambulatory Visit: Payer: Self-pay | Admitting: Family Medicine

## 2019-12-15 ENCOUNTER — Encounter: Payer: Self-pay | Admitting: Family Medicine

## 2019-12-16 ENCOUNTER — Other Ambulatory Visit: Payer: Self-pay | Admitting: Family Medicine

## 2019-12-16 MED ORDER — FLURAZEPAM HCL 30 MG PO CAPS: 30.0000 mg | ORAL_CAPSULE | Freq: Every day | ORAL | 0 refills | Status: DC

## 2019-12-17 ENCOUNTER — Other Ambulatory Visit: Payer: Self-pay | Admitting: Family Medicine

## 2019-12-22 ENCOUNTER — Ambulatory Visit: Payer: Medicare HMO | Admitting: Neurology

## 2019-12-24 ENCOUNTER — Telehealth: Payer: Self-pay | Admitting: Neurology

## 2019-12-24 NOTE — Telephone Encounter (Signed)
Patient was seen with [facility] Neurology 11/20/19 at 10:15AM

## 2019-12-26 ENCOUNTER — Other Ambulatory Visit (HOSPITAL_COMMUNITY): Payer: Self-pay | Admitting: Nurse Practitioner

## 2020-01-02 ENCOUNTER — Other Ambulatory Visit: Payer: Self-pay

## 2020-01-02 ENCOUNTER — Encounter: Payer: Self-pay | Admitting: Family Medicine

## 2020-01-02 ENCOUNTER — Ambulatory Visit (INDEPENDENT_AMBULATORY_CARE_PROVIDER_SITE_OTHER): Payer: Medicare HMO | Admitting: Family Medicine

## 2020-01-02 VITALS — BP 99/69 | HR 83 | Temp 97.4°F | Ht 65.0 in | Wt 184.4 lb

## 2020-01-02 DIAGNOSIS — I4891 Unspecified atrial fibrillation: Secondary | ICD-10-CM

## 2020-01-02 DIAGNOSIS — G4701 Insomnia due to medical condition: Secondary | ICD-10-CM

## 2020-01-02 DIAGNOSIS — E039 Hypothyroidism, unspecified: Secondary | ICD-10-CM | POA: Diagnosis not present

## 2020-01-02 DIAGNOSIS — I499 Cardiac arrhythmia, unspecified: Secondary | ICD-10-CM | POA: Diagnosis not present

## 2020-01-02 MED ORDER — OMEPRAZOLE 20 MG PO CPDR: 20.0000 mg | DELAYED_RELEASE_CAPSULE | Freq: Every day | ORAL | 3 refills | Status: DC

## 2020-01-02 MED ORDER — LEVOTHYROXINE SODIUM 125 MCG PO TABS: ORAL_TABLET | ORAL | 0 refills | Status: DC

## 2020-01-02 MED ORDER — FLURAZEPAM HCL 30 MG PO CAPS: 30.0000 mg | ORAL_CAPSULE | Freq: Every day | ORAL | 2 refills | Status: DC

## 2020-01-02 MED ORDER — TRAZODONE HCL 150 MG PO TABS: ORAL_TABLET | ORAL | 1 refills | Status: DC

## 2020-01-02 NOTE — Progress Notes (Signed)
Subjective:  Patient ID: Amber Williams, female    DOB: 10/30/1975  Age: 44 y.o. MRN: [id number]  CC: Follow-up   HPI Amber Williams presents for recheck of her chronic insomnia for which she takes Dalmane.  He is doing well with it and has no side effects.  She went through multiple trials of multiple medicines before we found one that allowed her to get a good night sleep.  She continues to avoid alcohol.  PDMP score shows that she has been filling the flurazepam on a regular basis monthly without any discrepancy.  Of note is that she had a prescription on March 2 for Tylenol 3 for 12 pills and on April 15 for tramadol for the 30 pills.  She has been undergoing treatment for colorectal cancer and also had some dental work doneat that time.  Depression screen Amber Williams 2/9 01/02/2020 08/07/2019 05/20/2019  Decreased Interest 0 0 0  Down, Depressed, Hopeless 0 0 0  PHQ - 2 Score 0 0 0  Altered sleeping - - 1  Tired, decreased energy - - 0  Change in appetite - - 0  Feeling bad or failure about yourself  - - 0  Trouble concentrating - - 0  Moving slowly or fidgety/restless - - 0  Suicidal thoughts - - 0  PHQ-9 Score - - 1  Difficult doing work/chores - - -  Some recent data might be hidden    History Amber Williams has a past medical history of Allergy, Anal cancer ([place]) (01/10/2019), Arthritis, Blood transfusion without reported diagnosis, Depression, DVT (deep venous thrombosis) ([place]) (2013), Family history of breast cancer, Gait abnormality (08/03/2016), Hyperlipidemia, Hypertension, Hypothyroidism, Multiple sclerosis ([place]), Neurogenic bladder (08/03/2016), Neuromuscular disorder ([place]), Port-A-Cath in place (01/23/2019), PTSD (post-traumatic stress disorder), Right foot drop (08/03/2016), Thyroid disease, and Ulcer.   She has a past surgical history that includes Colon surgery; Gastric bypass (2010); Interstim Implant placement (2014); Portacath placement; Hemorrhoid surgery (N/A, 12/25/2018); and Flexible  sigmoidoscopy (N/A, 06/25/2019).   Her family history includes Breast cancer (age of onset: 75) in her sister; Breast cancer (age of onset: 64) in her mother; Cancer in her paternal grandmother; Diabetes in her father, maternal grandmother, and paternal grandmother; Heart disease in her maternal grandfather, maternal grandmother, and paternal grandfather; Hypertension in her father and sister; Miscarriages / Stillbirths in her sister; Stroke in her paternal grandfather.She reports that she quit smoking about 10 years ago. Her smoking use included cigarettes. She started smoking about 28 years ago. She has a 22.50 pack-year smoking history. She has never used smokeless tobacco. She reports previous alcohol use. She reports that she does not use drugs.    ROS Review of Systems  Constitutional: Negative.   HENT: Negative.   Eyes: Negative for visual disturbance.  Respiratory: Negative for shortness of breath.   Cardiovascular: Negative for chest pain.  Gastrointestinal: Negative for abdominal pain.  Musculoskeletal: Negative for arthralgias.  Psychiatric/Behavioral: Positive for sleep disturbance. The patient is nervous/anxious.     Objective:  BP 99/69   Pulse 83   Temp (!) 97.4 F (36.3 C) (Temporal)   Ht 5' 5"  (1.651 m)   Wt 184 lb 6.4 oz (83.6 kg)   BMI 30.69 kg/m   BP Readings from Last 3 Encounters:  01/02/20 99/69  10/08/19 121/66  08/18/19 (!) 107/44    Wt Readings from Last 3 Encounters:  01/02/20 184 lb 6.4 oz (83.6 kg)  10/08/19 185 lb 12.8 oz (84.3 kg)  08/18/19 177 lb  3.2 oz (80.4 kg)     Physical Exam Constitutional:      General: She is not in acute distress.    Appearance: She is well-developed.  Cardiovascular:     Rate and Rhythm: Normal rate and regular rhythm.  Pulmonary:     Breath sounds: Normal breath sounds.  Musculoskeletal:     Right lower leg: Edema present.     Left lower leg: Edema present.  Skin:    General: Skin is warm and dry.    Neurological:     Mental Status: She is alert and oriented to person, place, and time.       Assessment & Plan:   Amber Williams was seen today for follow-up.  Diagnoses and all orders for this visit:  Atrial fibrillation, unspecified type (Amber Williams) -     EKG 12-Lead -     CBC with Differential/Platelet -     CMP14+EGFR -     TSH + free T4  Cardiac arrhythmia, unspecified cardiac arrhythmia type -     Holter monitor - 24 hour; Future -     CBC with Differential/Platelet -     CMP14+EGFR -     TSH + free T4  Insomnia due to medical condition -     traZODone (DESYREL) 150 MG tablet; Use from 1/3 to 1 tablet nightly as needed for sleep. -     CBC with Differential/Platelet -     CMP14+EGFR -     TSH + free T4  Hypothyroidism, unspecified type -     CBC with Differential/Platelet -     CMP14+EGFR -     TSH + free T4  Other orders -     flurazepam (DALMANE) 30 MG capsule; Take 1 capsule (30 mg total) by mouth at bedtime. -     Discontinue: levothyroxine (SYNTHROID) 125 MCG tablet; TAKE 1 TABLET BY MOUTH DAILY BEFORE BREAKFAST -     omeprazole (PRILOSEC) 20 MG capsule; Take 1 capsule (20 mg total) by mouth daily.       I have discontinued Amber Williams's Ocrelizumab (OCREVUS IV), Cathflo Activase, cephALEXin, levothyroxine, and ciprofloxacin. I have also changed her omeprazole. Additionally, I am having her maintain her Vitamin D3, vitamin B-12, Allergy Relief, mometasone, loperamide, amitriptyline, levonorgestrel-ethinyl estradiol, fluocinonide-emollient, escitalopram, furosemide, flurazepam, and traZODone.  Allergies as of 01/02/2020      Reactions   Other Hives   Plastic Tape   Tecfidera [dimethyl Fumarate] Itching      Medication List       Accurate as of January 02, 2020 11:59 PM. If you have any questions, ask your nurse or doctor.        STOP taking these medications   Cathflo Activase 2 MG injection Generic drug: alteplase Stopped by: [name], MD    cephALEXin 500 MG capsule Commonly known as: KEFLEX Stopped by: [name], MD   ciprofloxacin 500 MG tablet Commonly known as: CIPRO Stopped by: [name], MD   OCREVUS IV Stopped by: [name], MD     TAKE these medications   Allergy Relief 180 MG tablet Generic drug: fexofenadine Take 180 mg by mouth daily as needed for allergies.   amitriptyline 50 MG tablet Commonly known as: ELAVIL Take 1 tablet (50 mg total) by mouth at bedtime.   escitalopram 10 MG tablet Commonly known as: LEXAPRO TAKE 1 TABLET DAILY   fluocinonide-emollient 0.05 % cream Commonly known as: LIDEX-E Apply 1 application topically 2 (two) times daily. To affected  areas   flurazepam 30 MG capsule Commonly known as: DALMANE Take 1 capsule (30 mg total) by mouth at bedtime.   furosemide 20 MG tablet Commonly known as: LASIX TAKE 1 TABLET BY MOUTH EVERY DAY AS NEEDED   levonorgestrel-ethinyl estradiol 0.15-30 MG-MCG tablet Commonly known as: NORDETTE Take 1 tablet by mouth daily.   levothyroxine 125 MCG tablet Commonly known as: SYNTHROID TAKE 1 TABLET BY MOUTH DAILY BEFORE BREAKFAST   loperamide 1 MG/5ML solution Commonly known as: IMODIUM Take 6 mg by mouth as needed for diarrhea or loose stools.   mometasone 50 MCG/ACT nasal spray Commonly known as: NASONEX Place 2 sprays into the nose 2 (two) times daily as needed (allergies).   omeprazole 20 MG capsule Commonly known as: PRILOSEC Take 1 capsule (20 mg total) by mouth daily.   traZODone 150 MG tablet Commonly known as: DESYREL Use from 1/3 to 1 tablet nightly as needed for sleep.   vitamin B-12 500 MCG tablet Commonly known as: CYANOCOBALAMIN Take 500 mcg by mouth daily.   Vitamin D3 125 MCG (5000 UT) Caps Take 5,000 Units by mouth every morning.        Follow-up: No follow-ups on file.  [name], M.D.

## 2020-01-03 LAB — CMP14+EGFR
ALT: 12 IU/L (ref 0–32)
AST: 17 IU/L (ref 0–40)
Albumin/Globulin Ratio: 2.9 — ABNORMAL HIGH (ref 1.2–2.2)
Albumin: 4.1 g/dL (ref 3.8–4.8)
Alkaline Phosphatase: 81 IU/L (ref 48–121)
BUN/Creatinine Ratio: 10 (ref 9–23)
BUN: 9 mg/dL (ref 6–24)
Bilirubin Total: 0.3 mg/dL (ref 0.0–1.2)
CO2: 22 mmol/L (ref 20–29)
Calcium: 9.6 mg/dL (ref 8.7–10.2)
Chloride: 105 mmol/L (ref 96–106)
Creatinine, Ser: 0.86 mg/dL (ref 0.57–1.00)
GFR calc Af Amer: 96 mL/min/{1.73_m2} (ref >59)
GFR calc non Af Amer: 83 mL/min/{1.73_m2} (ref >59)
Globulin, Total: 1.4 g/dL — ABNORMAL LOW (ref 1.5–4.5)
Glucose: 72 mg/dL (ref 65–99)
Potassium: 4.5 mmol/L (ref 3.5–5.2)
Sodium: 142 mmol/L (ref 134–144)
Total Protein: 5.5 g/dL — ABNORMAL LOW (ref 6.0–8.5)

## 2020-01-03 LAB — CBC WITH DIFFERENTIAL/PLATELET
Basophils Absolute: 0.1 10*3/uL (ref 0.0–0.2)
Basos: 1 %
EOS (ABSOLUTE): 0.1 10*3/uL (ref 0.0–0.4)
Eos: 3 %
Hematocrit: 40.3 % (ref 34.0–46.6)
Hemoglobin: 12.9 g/dL (ref 11.1–15.9)
Immature Grans (Abs): 0 10*3/uL (ref 0.0–0.1)
Immature Granulocytes: 0 %
Lymphocytes Absolute: 0.8 10*3/uL (ref 0.7–3.1)
Lymphs: 14 %
MCH: 31.4 pg (ref 26.6–33.0)
MCHC: 32 g/dL (ref 31.5–35.7)
MCV: 98 fL — ABNORMAL HIGH (ref 79–97)
Monocytes Absolute: 0.5 10*3/uL (ref 0.1–0.9)
Monocytes: 10 %
Neutrophils Absolute: 3.9 10*3/uL (ref 1.4–7.0)
Neutrophils: 72 %
Platelets: 281 10*3/uL (ref 150–450)
RBC: 4.11 x10E6/uL (ref 3.77–5.28)
RDW: 12.8 % (ref 11.7–15.4)
WBC: 5.4 10*3/uL (ref 3.4–10.8)

## 2020-01-03 LAB — TSH+FREE T4
Free T4: 1.85 ng/dL — ABNORMAL HIGH (ref 0.82–1.77)
TSH: 0.286 u[IU]/mL — ABNORMAL LOW (ref 0.450–4.500)

## 2020-01-04 ENCOUNTER — Other Ambulatory Visit: Payer: Self-pay | Admitting: Family Medicine

## 2020-01-04 MED ORDER — LEVOTHYROXINE SODIUM 112 MCG PO TABS: ORAL_TABLET | ORAL | 1 refills | Status: DC

## 2020-01-05 ENCOUNTER — Encounter: Payer: Self-pay | Admitting: Family Medicine

## 2020-01-06 ENCOUNTER — Other Ambulatory Visit: Payer: Self-pay

## 2020-01-06 ENCOUNTER — Ambulatory Visit: Payer: Medicare HMO | Admitting: *Deleted

## 2020-01-06 DIAGNOSIS — I4891 Unspecified atrial fibrillation: Secondary | ICD-10-CM

## 2020-01-06 DIAGNOSIS — I499 Cardiac arrhythmia, unspecified: Secondary | ICD-10-CM

## 2020-01-06 NOTE — Progress Notes (Signed)
Patient in today for 24 hr holter monitor placement. Monitor placed and patient aware to return at this time tomorrow.

## 2020-01-08 ENCOUNTER — Ambulatory Visit (INDEPENDENT_AMBULATORY_CARE_PROVIDER_SITE_OTHER): Payer: Medicare HMO | Admitting: Licensed Clinical Social Worker

## 2020-01-08 DIAGNOSIS — F4310 Post-traumatic stress disorder, unspecified: Secondary | ICD-10-CM

## 2020-01-08 DIAGNOSIS — F331 Major depressive disorder, recurrent, moderate: Secondary | ICD-10-CM | POA: Diagnosis not present

## 2020-01-08 DIAGNOSIS — G35 Multiple sclerosis: Secondary | ICD-10-CM

## 2020-01-08 DIAGNOSIS — E039 Hypothyroidism, unspecified: Secondary | ICD-10-CM

## 2020-01-08 NOTE — Patient Instructions (Addendum)
Licensed Clinical Social Worker Visit Information  Goals we discussed today:    .  Client will talk with LCSW in next 3 weeks to discuss stress/anxiety management for client (pt-stated)       Current Barriers:   Client has MS (multiple sclerosis) and has Chronic Diagnoses also of Depression, PTSD, and Hypothyroidism  Client has decreased energy  Decreased appetite  Clinical Social Work Clinical Goal(s):   LCSW will talk with client in next 3 weeks about stress/anxiety management for client  Interventions:  Talked with client about pain issues of client  Talked with client about CCM program services  Encouraged client to talk with RNCM to discuss nursing needs of client  Talked with [facility] about her use of food stamps benefit  Talked with Roshni about health needs of her sister  Talked with Esty about health needs of her mother  Talked with client about radiation treatments of client in the past  Client and LCSW talked about blood pressure issues of client (client said she had a recent EKG)  Talked with client about vision issues of client (she said she obtained a pair of new glasses recently)  Talked with client about transport needs of client  Talked with client about mobility of client (she said she has weakness occasionally in her legs)  Talked with client about MS symptoms (she said she sees a new neurologist, Dr. Sarita Bottom, at [facility])  Talked with client about energy level of client [facility] said she gets fatigued occasionally)  Talked with client about appetite of client  Talked with client about her exercise routine  Patient Self Care Activities:   Attends classes, as able, at [place] medications as prescribed.  Plan:  Client to attend scheduled client medical appointments Client to communicate with RNCM to discuss nursing needs of client LCSW to call client in next 4 weeks to talk with her about managing  anxiety and stress issues of client  Initial goal documentation    .       Follow Up Plan: LCSW to call client in next 4 weeks to talk with client about managing anxiety and stress issues of client  Materials Provided: No  The patient verbalized understanding of instructions provided today and declined a print copy of patient instruction materials.   [name].Nashley Cordoba MSW, LCSW Licensed Clinical Social Worker [facility] [phone]

## 2020-01-08 NOTE — Chronic Care Management (AMB) (Signed)
Chronic Care Management    Clinical Social Work Follow Up Note  01/08/2020 Name: JANYTH RIERA MRN: [id number] DOB: 03/08/1976  Tawni Pummel Chesnut is a 44 y.o. year old female who is a primary care patient of [name], [name], MD. The CCM team was consulted for assistance with [employer] .   Review of patient status, including review of consultants reports, other relevant assessments, and collaboration with appropriate care team members and the patient's provider was performed as part of comprehensive patient evaluation and provision of chronic care management services.    SDOH (Social Determinants of Health) assessments performed: No;risk for tobacco use; risk for depression; risk for stress; risk for physical inactivity    Office Visit from 05/20/2019 in [facility]  PHQ-9 Total Score 1       GAD 7 : Generalized Anxiety Score 04/01/2019  Nervous, Anxious, on Edge 1  Control/stop worrying 0  Worry too much - different things 0  Trouble relaxing 1  Restless 0  Easily annoyed or irritable 1  Afraid - awful might happen 0  Total GAD 7 Score 3  Anxiety Difficulty Somewhat difficult    Outpatient Encounter Medications as of 01/08/2020  Medication Sig  . ALLERGY RELIEF 180 MG tablet Take 180 mg by mouth daily as needed for allergies.   [name] amitriptyline (ELAVIL) 50 MG tablet Take 1 tablet (50 mg total) by mouth at bedtime.  . Cholecalciferol (VITAMIN D3) 5000 units CAPS Take 5,000 Units by mouth every morning.  . escitalopram (LEXAPRO) 10 MG tablet TAKE 1 TABLET DAILY  . fluocinonide-emollient (LIDEX-E) 0.05 % cream Apply 1 application topically 2 (two) times daily. To affected areas  . flurazepam (DALMANE) 30 MG capsule Take 1 capsule (30 mg total) by mouth at bedtime.  . furosemide (LASIX) 20 MG tablet TAKE 1 TABLET BY MOUTH EVERY DAY AS NEEDED  . levonorgestrel-ethinyl estradiol (NORDETTE) 0.15-30 MG-MCG tablet Take 1 tablet by mouth daily.  [name]  levothyroxine (SYNTHROID) 112 MCG tablet TAKE 1 TABLET BY MOUTH DAILY BEFORE BREAKFAST  . loperamide (IMODIUM) 1 MG/5ML solution Take 6 mg by mouth as needed for diarrhea or loose stools.   . mometasone (NASONEX) 50 MCG/ACT nasal spray Place 2 sprays into the nose 2 (two) times daily as needed (allergies).   [name] omeprazole (PRILOSEC) 20 MG capsule Take 1 capsule (20 mg total) by mouth daily.  . traZODone (DESYREL) 150 MG tablet Use from 1/3 to 1 tablet nightly as needed for sleep.  . vitamin B-12 (CYANOCOBALAMIN) 500 MCG tablet Take 500 mcg by mouth daily.   No facility-administered encounter medications on file as of 01/08/2020.    Goals    .  Client will talk with LCSW in next 3 weeks to discuss stress/anxiety management for client (pt-stated)      Current Barriers:  . Client has MS (multiple sclerosis) and has Chronic Diagnoses also of Depression, PTSD, and Hypothyroidism . Client has decreased energy . Decreased appetite  Clinical Social Work Clinical Goal(s):  [name] LCSW will talk with client in next 3 weeks about stress/anxiety management for client  Interventions: . Talked with client about pain issues of client . Talked with client about CCM program services . Encouraged client to talk with RNCM to discuss nursing needs of client . Talked with [facility] about her use of food stamps benefit . Talked with Tonya about health needs of her sister . Talked with Ryla about health needs of her mother . Talked with client about radiation  treatments of client in the past . Client and LCSW talked about blood pressure issues of client (client said she had a recent EKG) . Talked with client about vision issues of client (she said she obtained a pair of new glasses recently) . Talked with client about transport needs of client . Talked with client about mobility of client (she said she has weakness occasionally in her legs) . Talked with client about MS symptoms (she said she sees a new neurologist,  Dr. Sarita Bottom, at [facility]) . Talked with client about energy level of client [facility] said she gets fatigued occasionally) . Talked with client about appetite of client . Talked with client about her exercise routine  Patient Self Care Activities:   Attends classes, as able, at [place] medications as prescribed.  Plan:  Client to attend scheduled client medical appointments Client to communicate with RNCM to discuss nursing needs of client LCSW to call client in next 4 weeks to talk with her about managing anxiety and stress issues of client  Initial goal documentation    .      Follow Up Plan: LCSW to call client in next 4 weeks to talk with client about managing anxiety and stress issues of client  [name].Anokhi Shannon MSW, LCSW Licensed Clinical Social Worker [facility] [phone]

## 2020-01-12 ENCOUNTER — Other Ambulatory Visit: Payer: Self-pay | Admitting: Family Medicine

## 2020-01-12 ENCOUNTER — Encounter: Payer: Self-pay | Admitting: Family Medicine

## 2020-01-12 ENCOUNTER — Telehealth: Payer: Self-pay | Admitting: *Deleted

## 2020-01-12 NOTE — Telephone Encounter (Signed)
Patient aware of holter monitor results- No significant abnormalities per Dr. Livia Snellen

## 2020-01-19 ENCOUNTER — Encounter: Payer: Self-pay | Admitting: Cardiology

## 2020-01-19 ENCOUNTER — Ambulatory Visit (INDEPENDENT_AMBULATORY_CARE_PROVIDER_SITE_OTHER): Payer: Medicare HMO | Admitting: Cardiology

## 2020-01-19 VITALS — BP 102/80 | HR 105 | Ht 65.0 in | Wt 189.0 lb

## 2020-01-19 DIAGNOSIS — I499 Cardiac arrhythmia, unspecified: Secondary | ICD-10-CM

## 2020-01-19 DIAGNOSIS — I491 Atrial premature depolarization: Secondary | ICD-10-CM | POA: Diagnosis not present

## 2020-01-19 DIAGNOSIS — R011 Cardiac murmur, unspecified: Secondary | ICD-10-CM

## 2020-01-19 NOTE — Patient Instructions (Signed)
Your physician wants you to follow-up in: 1 YEAR WITH DR MCDOWELL You will receive a reminder letter in the mail two months in advance. If you don't receive a letter, please call our office to schedule the follow-up appointment.  Your physician recommends that you continue on your current medications as directed. Please refer to the Current Medication list given to you today.  Your physician has requested that you have an echocardiogram. Echocardiography is a painless test that uses sound waves to create images of your heart. It provides your doctor with information about the size and shape of your heart and how well your heart's chambers and valves are working. This procedure takes approximately one hour. There are no restrictions for this procedure.  Thank you for choosing  [facility]!!    

## 2020-01-19 NOTE — Progress Notes (Signed)
Cardiology Office Note  Date: 01/19/2020   ID: Amber Williams, DOB [birth year], MRN [id number]  PCP:  [name], MD  Cardiologist:  Rozann Lesches, MD Electrophysiologist:  None   Chief Complaint  Patient presents with  . Irregular heartbeat    History of Present Illness: Amber Williams is a 44 y.o. female referred for cardiology consultation by Dr. Livia Snellen for the evaluation of irregular heartbeat.  She is here today with her mother.  My understanding per discussion with them and review of the chart is that she was told that she had "atrial fibrillation" based on irregular heartbeat noticed by neurologist.  I personally reviewed the ECG from early September around this time however, and her rhythm was sinus with PACs and PVCs.  She was subsequently seen by Dr. Livia Snellen and referred for cardiac monitor.  24-hour Holter monitor was placed in early September at [facility].  Predominant rhythm is sinus with heart rate ranging from 67 bpm up to 154 bpm and average heart rate 95 bpm.  Rare PVCs were noted representing 0.4% total beats.  Frequent PACs were noted representing 13% total beats.  No atrial fibrillation or other sustained arrhythmias documented.  Today I discussed the results of her recent cardiac monitor.  It is likely that her irregular heartbeat is associated with frequent PACs.  So far no definitive evidence of atrial fibrillation.  She states that she feels her heartbeat sometimes when she is still at nighttime, otherwise not when active.  She exercises at [employer] when she does not have flares of her MS.  She tries to go 3 or 4 days a week and feels well when she exercises.  I reviewed her medications which are outlined below.  Past Medical History:  Diagnosis Date  . Anal cancer ([place]) 01/10/2019  . Arthritis   . Depression   . DVT (deep venous thrombosis) ([place]) 2013  . Essential hypertension   . Family history of breast cancer   . Gait abnormality 08/03/2016  .  History of blood transfusion 2013  . Hyperlipidemia   . Hypothyroidism   . Hypothyroidism   . Multiple sclerosis ([place])   . Neurogenic bladder 08/03/2016  . Port-A-Cath in place 01/23/2019  . PTSD (post-traumatic stress disorder)   . Right foot drop 08/03/2016  . Seasonal allergies   . Ulcer     Past Surgical History:  Procedure Laterality Date  . COLON SURGERY     Colonoscopy  . FLEXIBLE SIGMOIDOSCOPY N/A 06/25/2019   Procedure: FLEXIBLE SIGMOIDOSCOPY ANOSCOPY;  Surgeon: Aviva Signs, MD;  Location: [facility];  Service: General;  Laterality: N/A;  . GASTRIC BYPASS  2010  . HEMORRHOID SURGERY N/A 12/25/2018   Procedure: EXTENSIVE HEMORRHOIDECTOMY;  Surgeon: Aviva Signs, MD;  Location: [facility];  Service: General;  Laterality: N/A;  . INTERSTIM IMPLANT PLACEMENT  2014  . PORTACATH PLACEMENT      Current Outpatient Medications  Medication Sig Dispense Refill  . ALLERGY RELIEF 180 MG tablet Take 180 mg by mouth daily as needed for allergies.     [name] amitriptyline (ELAVIL) 50 MG tablet Take 1 tablet (50 mg total) by mouth at bedtime. 90 tablet 3  . Cholecalciferol (VITAMIN D3) 5000 units CAPS Take 5,000 Units by mouth every morning.    . escitalopram (LEXAPRO) 10 MG tablet TAKE 1 TABLET DAILY 90 tablet 0  . fluocinonide-emollient (LIDEX-E) 0.05 % cream Apply 1 application topically 2 (two) times daily. To affected areas 60 g 5  .  flurazepam (DALMANE) 30 MG capsule Take 1 capsule (30 mg total) by mouth at bedtime. 30 capsule 2  . furosemide (LASIX) 20 MG tablet TAKE 1 TABLET BY MOUTH EVERY DAY AS NEEDED 30 tablet 1  . levonorgestrel-ethinyl estradiol (NORDETTE) 0.15-30 MG-MCG tablet Take 1 tablet by mouth daily. 1 Package 11  . levothyroxine (SYNTHROID) 112 MCG tablet TAKE 1 TABLET BY MOUTH DAILY BEFORE BREAKFAST 90 tablet 1  . loperamide (IMODIUM) 1 MG/5ML solution Take 6 mg by mouth as needed for diarrhea or loose stools.     . mometasone (NASONEX) 50 MCG/ACT nasal spray Place 2 sprays into  the nose 2 (two) times daily as needed (allergies).     [name] omeprazole (PRILOSEC) 20 MG capsule Take 1 capsule (20 mg total) by mouth daily. 90 capsule 3  . traZODone (DESYREL) 150 MG tablet Use from 1/3 to 1 tablet nightly as needed for sleep. 90 tablet 1  . vitamin B-12 (CYANOCOBALAMIN) 500 MCG tablet Take 500 mcg by mouth daily.     No current facility-administered medications for this visit.   Allergies:  Other and Tecfidera [dimethyl fumarate]   Social History: The patient  reports that she quit smoking about 10 years ago. Her smoking use included cigarettes. She started smoking about 28 years ago. She has a 22.50 pack-year smoking history. She has never used smokeless tobacco. She reports previous alcohol use. She reports that she does not use drugs.   Family History: The patient's family history includes Breast cancer (age of onset: 51) in her sister; Breast cancer (age of onset: 6) in her mother; Cancer in her paternal grandmother; Diabetes in her father, maternal grandmother, and paternal grandmother; Heart disease in her maternal grandfather, maternal grandmother, and paternal grandfather; Hypertension in her father and sister; Miscarriages / Stillbirths in her sister; Stroke in her paternal grandfather.   ROS:  No syncope.  Physical Exam: VS:  BP 102/80   Pulse (!) 105   Ht 5\' 5"  (1.651 m)   Wt 189 lb (85.7 kg)   SpO2 98%   BMI 31.45 kg/m , BMI Body mass index is 31.45 kg/m.  Wt Readings from Last 3 Encounters:  01/19/20 189 lb (85.7 kg)  01/02/20 184 lb 6.4 oz (83.6 kg)  10/08/19 185 lb 12.8 oz (84.3 kg)    General: Patient appears comfortable at rest, seated in wheelchair. HEENT: Conjunctiva and lids normal, wearing a mask. Neck: Supple, no elevated JVP or carotid bruits, no thyromegaly. Lungs: Clear to auscultation, nonlabored breathing at rest. Cardiac: Regular rate and rhythm with rare ectopic beat, no S3, soft systolic murmur, no pericardial rub. Abdomen: Soft,  bowel sounds present. Extremities: No pitting edema. Skin: Warm and dry. Musculoskeletal: No kyphosis. Neuropsychiatric: Alert and oriented x3, affect grossly appropriate.  Dysarthria.  ECG:  An ECG dated 01/02/2020 was personally reviewed today and demonstrated:  Poor quality tracing with lead loss.  Sinus rhythm with PACs and PVCs, poor R wave progression.  Recent Labwork: 08/14/2019: Magnesium 2.0 01/02/2020: ALT 12; AST 17; BUN 9; Creatinine, Ser 0.86; Hemoglobin 12.9; Platelets 281; Potassium 4.5; Sodium 142; TSH 0.286     Component Value Date/Time   CHOL 185 01/15/2017 1025   TRIG 138 01/15/2017 1025   HDL 83 01/15/2017 1025   CHOLHDL 2.2 01/15/2017 1025   LDLCALC 74 01/15/2017 1025    Other Studies Reviewed Today:  No prior cardiac testing for review today.  Assessment and Plan:  1.  Frequent PACs corresponding to irregular heartbeat.  No  clear evidence of atrial fibrillation based on recent tracings or cardiac monitor.  At this point would not necessarily recommend medical therapy such as beta-blocker or calcium channel blocker unless she is bothered by frequent palpitations.  Regular exercise should be beneficial.  She has a soft cardiac murmur on exam which is likely benign, echocardiogram will however be obtained to ensure normal cardiac structure and function.  We will continue with strategy of observation for now.  2.  Essential hypertension by history.  Currently not on antihypertensive therapy with follow-up by PCP.  Blood pressure is normal today.  3.  Hypothyroidism, on Synthroid.  TSH low at 0.286 recently.  Could potentially be contributing to atrial ectopy.  Keep follow-up with PCP for further adjustments.  Medication Adjustments/Labs and Tests Ordered: Current medicines are reviewed at length with the patient today.  Concerns regarding medicines are outlined above.   Tests Ordered: Orders Placed This Encounter  Procedures  . ECHOCARDIOGRAM COMPLETE     Medication Changes: No orders of the defined types were placed in this encounter.   Disposition:  Follow up 1 year in the [place] office.  Signed, [name], MD, [facility] 01/19/2020 11:10 AM    [place] at [place], [place], [place] [address] Phone: [phone]; Fax: [phone]

## 2020-01-21 ENCOUNTER — Other Ambulatory Visit (HOSPITAL_COMMUNITY): Payer: Self-pay | Admitting: Nurse Practitioner

## 2020-01-27 ENCOUNTER — Telehealth: Payer: Self-pay | Admitting: *Deleted

## 2020-01-27 NOTE — Telephone Encounter (Signed)
    Transitional Care Management  Contact Attempt Attempt Date:01/27/2020 Attempted By: Truett Mainland, LPN  1st unsuccessful TCM contact attempt.   I reached out to Amber Williams on her preferred telephone number to discuss Transitional Care Management, medication reconciliation, and to schedule a TCM hospital follow-up with her PCP at [facility].  Discharge Date: 01/25/2020  Location: [facility]  Discharge Dx: Exacerbation of Multiple Sclerosis  Recommendations for Outpatient Follow-up:  Gold card request for follow-up placed for PCP in 0 to 7 days Dr. Nestor Ramp in 2 to 4 weeks  (insert from discharge summary)   Plan I left a HIPAA compliant message for her to return my call.  Will attempt to contact again within the 2 business day post discharge window if she does not return my call.

## 2020-01-28 NOTE — Telephone Encounter (Signed)
Na call back to late for [facility] will reach out for hospital follow up

## 2020-01-31 ENCOUNTER — Other Ambulatory Visit: Payer: Self-pay | Admitting: Family Medicine

## 2020-01-31 DIAGNOSIS — G4701 Insomnia due to medical condition: Secondary | ICD-10-CM

## 2020-02-03 ENCOUNTER — Other Ambulatory Visit: Payer: Self-pay

## 2020-02-03 ENCOUNTER — Encounter: Payer: Self-pay | Admitting: Family Medicine

## 2020-02-03 ENCOUNTER — Ambulatory Visit (INDEPENDENT_AMBULATORY_CARE_PROVIDER_SITE_OTHER): Payer: Medicare HMO | Admitting: Family Medicine

## 2020-02-03 VITALS — BP 116/71 | HR 95 | Temp 96.5°F | Resp 20 | Ht 65.0 in | Wt 189.0 lb

## 2020-02-03 DIAGNOSIS — Z23 Encounter for immunization: Secondary | ICD-10-CM

## 2020-02-03 DIAGNOSIS — G35 Multiple sclerosis: Secondary | ICD-10-CM

## 2020-02-03 MED ORDER — BETAMETHASONE SOD PHOS & ACET 6 (3-3) MG/ML IJ SUSP
6.0000 mg | Freq: Once | INTRAMUSCULAR | Status: AC
  Administered 2020-02-03: 6 mg via INTRAMUSCULAR

## 2020-02-03 MED ORDER — AMITRIPTYLINE HCL 50 MG PO TABS: 50.0000 mg | ORAL_TABLET | Freq: Every day | ORAL | 1 refills | Status: DC

## 2020-02-03 NOTE — Progress Notes (Signed)
Subjective:  Patient ID: Amber Williams, female    DOB: 20-Mar-1976  Age: 44 y.o. MRN: [id number]  CC: Transitions Of Care   HPI Amber Williams presents for recent exacerbation of her MS pain.  She has not been on a specific MS medicine for about a year and a half now.  Most recently her neurologist, she states, told her that her symptoms were not true MS symptoms and she did not want to continue her on the previous medication for MS, instead they are going to switch her to something else but that was delayed due to potential allergy.  She is having some interruptions sleeping.  She uses amitriptyline and has been out of that.  Currently she is having tightness in both lower extremities.  This seems to be better when she takes the amitriptyline.  It got so bad last week that she was dragging her right leg and was seen in the emergency department and transferred to [facility] due to the possibility of an acute stroke.  She had an MRI that showed no evidence for stroke or exacerbation of her MS at that time.  Depression screen [facility] 2/9 02/03/2020 01/02/2020 08/07/2019  Decreased Interest 0 0 0  Down, Depressed, Hopeless 0 0 0  PHQ - 2 Score 0 0 0  Altered sleeping - - -  Tired, decreased energy - - -  Change in appetite - - -  Feeling bad or failure about yourself  - - -  Trouble concentrating - - -  Moving slowly or fidgety/restless - - -  Suicidal thoughts - - -  PHQ-9 Score - - -  Difficult doing work/chores - - -  Some recent data might be hidden    History Amber Williams has a past medical history of Anal cancer ([place]) (01/10/2019), Arthritis, Depression, DVT (deep venous thrombosis) ([place]) (2013), Essential hypertension, Family history of breast cancer, 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   She has a past surgical history that includes Colon surgery; Gastric bypass (2010); Interstim Implant placement (2014); Portacath placement; Hemorrhoid surgery (N/A, 12/25/2018); and Flexible sigmoidoscopy (N/A, 06/25/2019).   Her family history includes Breast cancer (age of onset: 64) in her sister; Breast cancer (age of onset: 98) in her mother; Cancer in her paternal grandmother; Diabetes in her father, maternal grandmother, and paternal grandmother; Heart disease in her maternal grandfather, maternal grandmother, and paternal grandfather; Hypertension in her father and sister; Miscarriages / Stillbirths in her sister; Stroke in her paternal grandfather.She reports that she quit smoking about 11 years ago. Her smoking use included cigarettes. She started smoking about 28 years ago. She has a 22.50 pack-year smoking history. She has never used smokeless tobacco. She reports previous alcohol use. She reports that she does not use drugs.    ROS Review of Systems  Constitutional: Positive for activity change and fatigue. Negative for fever.  HENT: Negative.   Respiratory: Negative for shortness of breath.   Cardiovascular: Negative for chest pain.  Gastrointestinal: Negative for abdominal pain.  Musculoskeletal: Positive for gait problem, joint swelling and myalgias.  Psychiatric/Behavioral: Positive for sleep disturbance.    Objective:  BP 116/71   Pulse 95   Temp (!) 96.5 F (35.8 C) (Temporal)   Resp 20   Ht 5\' 5"  (1.651 m)   Wt 189 lb (  85.7 kg)   SpO2 95%   BMI 31.45 kg/m   BP Readings from Last 3 Encounters:  02/03/20 116/71  01/19/20 102/80  01/02/20 99/69    Wt Readings from Last 3 Encounters:  02/03/20 189 lb (85.7 kg)  01/19/20 189 lb (85.7 kg)  01/02/20 184 lb 6.4 oz (83.6 kg)     Physical Exam Constitutional:      General: She is not in acute distress.    Appearance: She is  well-developed.  Cardiovascular:     Rate and Rhythm: Normal rate and regular rhythm.  Pulmonary:     Breath sounds: Normal breath sounds.  Skin:    General: Skin is warm and dry.  Neurological:     Mental Status: She is alert and oriented to person, place, and time.       Assessment & Plan:   Amber Williams was seen today for transitions of care.  Diagnoses and all orders for this visit:  Need for immunization against influenza -     Flu Vaccine QUAD 36+ mos IM  Multiple sclerosis (HCC) -     amitriptyline (ELAVIL) 50 MG tablet; Take 1 tablet (50 mg total) by mouth at bedtime. -     Ambulatory referral to Neurology -     betamethasone acetate-betamethasone sodium phosphate (CELESTONE) injection 6 mg       I am having Amber Williams maintain her Vitamin D3, vitamin B-12, Allergy Relief, mometasone, loperamide, levonorgestrel-ethinyl estradiol, fluocinonide-emollient, escitalopram, flurazepam, omeprazole, levothyroxine, furosemide, traZODone, aspirin, and amitriptyline. We administered betamethasone acetate-betamethasone sodium phosphate.  Allergies as of 02/03/2020      Reactions   Other Hives   Plastic Tape   Tecfidera [dimethyl Fumarate] Itching      Medication List       Accurate as of February 03, 2020  9:58 PM. If you have any questions, ask your nurse or doctor.        Allergy Relief 180 MG tablet Generic drug: fexofenadine Take 180 mg by mouth daily as needed for allergies.   amitriptyline 50 MG tablet Commonly known as: ELAVIL Take 1 tablet (50 mg total) by mouth at bedtime.   aspirin 81 MG EC tablet Take 81 mg by mouth daily.   escitalopram 10 MG tablet Commonly known as: LEXAPRO TAKE 1 TABLET DAILY   fluocinonide-emollient 0.05 % cream Commonly known as: LIDEX-E Apply 1 application topically 2 (two) times daily. To affected areas   flurazepam 30 MG capsule Commonly known as: DALMANE Take 1 capsule (30 mg total) by mouth at bedtime.     furosemide 20 MG tablet Commonly known as: LASIX TAKE 1 TABLET BY MOUTH EVERY DAY AS NEEDED   levonorgestrel-ethinyl estradiol 0.15-30 MG-MCG tablet Commonly known as: NORDETTE Take 1 tablet by mouth daily.   levothyroxine 112 MCG tablet Commonly known as: SYNTHROID TAKE 1 TABLET BY MOUTH DAILY BEFORE BREAKFAST   loperamide 1 MG/5ML solution Commonly known as: IMODIUM Take 6 mg by mouth as needed for diarrhea or loose stools.   mometasone 50 MCG/ACT nasal spray Commonly known as: NASONEX Place 2 sprays into the nose 2 (two) times daily as needed (allergies).   omeprazole 20 MG capsule Commonly known as: PRILOSEC Take 1 capsule (20 mg total) by mouth daily.   traZODone 150 MG tablet Commonly known as: DESYREL USE FROM 1/3 TO 1 TABLET ASNEEDED FOR SLEEP   vitamin B-12 500 MCG tablet Commonly known as: CYANOCOBALAMIN Take 500 mcg by mouth daily.   Vitamin D3 125  MCG (5000 UT) Caps Take 5,000 Units by mouth every morning.        Follow-up: Return in about 3 months (around 05/05/2020).  Amber Williams, M.D.

## 2020-02-04 ENCOUNTER — Other Ambulatory Visit: Payer: Self-pay

## 2020-02-04 ENCOUNTER — Inpatient Hospital Stay (HOSPITAL_COMMUNITY): Payer: Medicare HMO | Attending: Hematology

## 2020-02-04 DIAGNOSIS — Z803 Family history of malignant neoplasm of breast: Secondary | ICD-10-CM | POA: Diagnosis not present

## 2020-02-04 DIAGNOSIS — G35 Multiple sclerosis: Secondary | ICD-10-CM | POA: Diagnosis not present

## 2020-02-04 DIAGNOSIS — C210 Malignant neoplasm of anus, unspecified: Secondary | ICD-10-CM | POA: Insufficient documentation

## 2020-02-04 LAB — COMPREHENSIVE METABOLIC PANEL
ALT: 35 U/L (ref 0–44)
AST: 27 U/L (ref 15–41)
Albumin: 3.9 g/dL (ref 3.5–5.0)
Alkaline Phosphatase: 62 U/L (ref 38–126)
Anion gap: 9 (ref 5–15)
BUN: 9 mg/dL (ref 6–20)
CO2: 25 mmol/L (ref 22–32)
Calcium: 10 mg/dL (ref 8.9–10.3)
Chloride: 104 mmol/L (ref 98–111)
Creatinine, Ser: 0.78 mg/dL (ref 0.44–1.00)
GFR calc non Af Amer: >60 mL/min (ref >60)
Glucose, Bld: 63 mg/dL — ABNORMAL LOW (ref 70–99)
Potassium: 3.8 mmol/L (ref 3.5–5.1)
Sodium: 138 mmol/L (ref 135–145)
Total Bilirubin: 0.6 mg/dL (ref 0.3–1.2)
Total Protein: 6.3 g/dL — ABNORMAL LOW (ref 6.5–8.1)

## 2020-02-04 LAB — CBC WITH DIFFERENTIAL/PLATELET
Abs Immature Granulocytes: 0.06 10*3/uL (ref 0.00–0.07)
Basophils Absolute: 0 10*3/uL (ref 0.0–0.1)
Basophils Relative: 0 %
Eosinophils Absolute: 0 10*3/uL (ref 0.0–0.5)
Eosinophils Relative: 0 %
HCT: 42.2 % (ref 36.0–46.0)
Hemoglobin: 13.6 g/dL (ref 12.0–15.0)
Immature Granulocytes: 1 %
Lymphocytes Relative: 7 %
Lymphs Abs: 0.8 10*3/uL (ref 0.7–4.0)
MCH: 32.5 pg (ref 26.0–34.0)
MCHC: 32.2 g/dL (ref 30.0–36.0)
MCV: 100.7 fL — ABNORMAL HIGH (ref 80.0–100.0)
Monocytes Absolute: 1 10*3/uL (ref 0.1–1.0)
Monocytes Relative: 8 %
Neutro Abs: 10 10*3/uL — ABNORMAL HIGH (ref 1.7–7.7)
Neutrophils Relative %: 84 %
Platelets: 255 10*3/uL (ref 150–400)
RBC: 4.19 MIL/uL (ref 3.87–5.11)
RDW: 14.4 % (ref 11.5–15.5)
WBC: 11.9 10*3/uL — ABNORMAL HIGH (ref 4.0–10.5)
nRBC: 0 % (ref 0.0–0.2)

## 2020-02-09 ENCOUNTER — Other Ambulatory Visit: Payer: Self-pay | Admitting: Family Medicine

## 2020-02-09 NOTE — Telephone Encounter (Signed)
OV 04/16/20

## 2020-02-11 ENCOUNTER — Inpatient Hospital Stay (HOSPITAL_BASED_OUTPATIENT_CLINIC_OR_DEPARTMENT_OTHER): Payer: Medicare HMO | Admitting: Hematology

## 2020-02-11 ENCOUNTER — Ambulatory Visit (INDEPENDENT_AMBULATORY_CARE_PROVIDER_SITE_OTHER): Payer: Medicare HMO | Admitting: Licensed Clinical Social Worker

## 2020-02-11 VITALS — BP 128/65 | HR 113 | Temp 96.9°F | Resp 17 | Wt 187.8 lb

## 2020-02-11 DIAGNOSIS — E039 Hypothyroidism, unspecified: Secondary | ICD-10-CM | POA: Diagnosis not present

## 2020-02-11 DIAGNOSIS — G35 Multiple sclerosis: Secondary | ICD-10-CM

## 2020-02-11 DIAGNOSIS — F331 Major depressive disorder, recurrent, moderate: Secondary | ICD-10-CM | POA: Diagnosis not present

## 2020-02-11 DIAGNOSIS — C210 Malignant neoplasm of anus, unspecified: Secondary | ICD-10-CM | POA: Diagnosis not present

## 2020-02-11 DIAGNOSIS — F4310 Post-traumatic stress disorder, unspecified: Secondary | ICD-10-CM

## 2020-02-11 NOTE — Chronic Care Management (AMB) (Signed)
Chronic Care Management    Clinical Social Work Follow Up Note  02/11/2020 Name: DAMONI ERKER MRN: [id number] DOB: [birth year]  Tawni Pummel Depolo is a 44 y.o. year old female who is a primary care patient of [name], [name], MD. The CCM team was consulted for assistance with [employer] .   Review of patient status, including review of consultants reports, other relevant assessments, and collaboration with appropriate care team members and the patient's provider was performed as part of comprehensive patient evaluation and provision of chronic care management services.    SDOH (Social Determinants of Health) assessments performed: No;risk for depression; risk for tobacco use; risk for stress; risk for financial strain    Office Visit from 05/20/2019 in [facility]  PHQ-9 Total Score 1     GAD 7 : Generalized Anxiety Score 04/01/2019  Nervous, Anxious, on Edge 1  Control/stop worrying 0  Worry too much - different things 0  Trouble relaxing 1  Restless 0  Easily annoyed or irritable 1  Afraid - awful might happen 0  Total GAD 7 Score 3  Anxiety Difficulty Somewhat difficult    Outpatient Encounter Medications as of 02/11/2020  Medication Sig  . ALLERGY RELIEF 180 MG tablet Take 180 mg by mouth daily as needed for allergies.   [name] ALTAVERA 0.15-30 MG-MCG tablet TAKE 1 TABLET BY MOUTH EVERY DAY  . amitriptyline (ELAVIL) 50 MG tablet Take 1 tablet (50 mg total) by mouth at bedtime.  [name] aspirin 81 MG EC tablet Take 81 mg by mouth daily.  . Cholecalciferol (VITAMIN D3) 5000 units CAPS Take 5,000 Units by mouth every morning.  . escitalopram (LEXAPRO) 10 MG tablet TAKE 1 TABLET DAILY  . fluocinonide-emollient (LIDEX-E) 0.05 % cream Apply 1 application topically 2 (two) times daily. To affected areas (Patient not taking: Reported on 02/03/2020)  . flurazepam (DALMANE) 30 MG capsule Take 1 capsule (30 mg total) by mouth at bedtime.  . furosemide (LASIX) 20 MG  tablet TAKE 1 TABLET BY MOUTH EVERY DAY AS NEEDED (Patient not taking: Reported on 02/03/2020)  . levothyroxine (SYNTHROID) 112 MCG tablet TAKE 1 TABLET BY MOUTH DAILY BEFORE BREAKFAST  . loperamide (IMODIUM) 1 MG/5ML solution Take 6 mg by mouth as needed for diarrhea or loose stools.   . mometasone (NASONEX) 50 MCG/ACT nasal spray Place 2 sprays into the nose 2 (two) times daily as needed (allergies).   [name] omeprazole (PRILOSEC) 20 MG capsule Take 1 capsule (20 mg total) by mouth daily.  . traZODone (DESYREL) 150 MG tablet USE FROM 1/3 TO 1 TABLET ASNEEDED FOR SLEEP  . vitamin B-12 (CYANOCOBALAMIN) 500 MCG tablet Take 500 mcg by mouth daily.   No facility-administered encounter medications on file as of 02/11/2020.    Goals    .  Client will talk with LCSW in next 3 weeks to discuss stress/anxiety management for client (pt-stated)      Current Barriers:  . Client has MS (multiple sclerosis) and has Chronic Diagnoses also of Depression, PTSD, and Hypothyroidism . Client has decreased energy . Decreased appetite  Clinical Social Work Clinical Goal(s):  [name] LCSW will talk with client in next 3 weeks about stress/anxiety management for client  Interventions: . Talked with client about pain issues of client . Talked with client about CCM program services . Encouraged client to talk with RNCM to discuss nursing needs of client . Talked with [facility] about her use of food stamps benefit . Talked with client about radiation  treatments of client . Talked with client about mobility issues of client . Talked with client about decreased energy of client . Talked with client about sleep issues of client . Talked with client about medication procurement of client . Talked with client about transport needs of client . Talked with client about family support (mother of client is supportive) . Talked with client about difficulty client has in standing  Patient Self Care Activities:   Attends classes, as  able a, at [facility] medications as prescribed.  Plan:  Client to attend scheduled client medical appointments Client to communicate with RNCM to discuss nursing needs of client LCSW to call client in next 4 weeks to talk with her about managing anxiety and stress issues of client  Initial goal documentation     Follow Up Plan: LCSW to call client in next 4 weeks to talk with client about managing anxiety and stress issues of client  [name].Allianna Beaubien MSW, LCSW Licensed Clinical Social Worker [facility] Care Management [phone]

## 2020-02-11 NOTE — Patient Instructions (Signed)
[place] at [facility] Discharge Instructions  You were seen today by Dr. Delton Coombes. He went over your recent results. You will be referred to Dr. Arnoldo Morale for your anoscopy. Dr. Delton Coombes will see you back in 6 months for labs and follow up.   Thank you for choosing [place] at [facility] to provide your oncology and hematology care.  To afford each patient quality time with our provider, please arrive at least 15 minutes before your scheduled appointment time.   If you have a lab appointment with the [facility] please come in thru the Main Entrance and check in at the main information desk  You need to re-schedule your appointment should you arrive 10 or more minutes late.  We strive to give you quality time with our providers, and arriving late affects you and other patients whose appointments are after yours.  Also, if you no show three or more times for appointments you may be dismissed from the clinic at the providers discretion.     Again, thank you for choosing [facility].  Our hope is that these requests will decrease the amount of time that you wait before being seen by our physicians.       _____________________________________________________________  Should you have questions after your visit to [facility], please contact our office at [phone] between the hours of 8:00 a.m. and 4:30 p.m.  Voicemails left after 4:00 p.m. will not be returned until the following business day.  For prescription refill requests, have your pharmacy contact our office and allow 72 hours.    Cancer Center Support Programs:   > Cancer Support Group  2nd Tuesday of the month 1pm-2pm, Journey Room

## 2020-02-11 NOTE — Patient Instructions (Addendum)
Licensed Clinical Social Worker Visit Information  Goals we discussed today:    Client will talk with LCSW in next 3 weeks to discuss stress/anxiety management for client (pt-stated)        Current Barriers:   Client has MS (multiple sclerosis) and has Chronic Diagnoses also of Depression, PTSD, and Hypothyroidism  Client has decreased energy  Decreased appetite  Clinical Social Work Clinical Goal(s):   LCSW will talk with client in next 3 weeks about stress/anxiety management for client  Interventions:  Talked with client about pain issues of client  Talked with client about CCM program services  Encouraged client to talk with RNCM to discuss nursing needs of client  Talked with [facility] about her use of food stamps benefit  Talked with client about radiation treatments of client  Talked with client about mobility issues of client  Talked with client about decreased energy of client  Talked with client about sleep issues of client  Talked with client about medication procurement of client  Talked with client about transport needs of client  Talked with client about family support (mother of client is supportive)  Talked with client about difficulty client has in standing  Patient Self Care Activities:   Attends classes, as able a, at [place] medications as prescribed.  Plan:  Client to attend scheduled client medical appointments Client to communicate with RNCM to discuss nursing needs of client LCSW to call client in next 4 weeks to talk with her about managing anxiety and stress issues of client  Initial goal documentation     Follow Up Plan: LCSW to call client in next 4 weeks to talk with client about managing anxiety and stress issues of client  Materials Provided: No  The patient verbalized understanding of instructions provided today and declined a print copy of patient instruction materials.   [name].Allen Egerton MSW, LCSW Licensed Clinical Social Worker [facility] [phone]

## 2020-02-11 NOTE — Progress Notes (Signed)
Amber Williams, [place] [address]   CLINIC:  Medical Oncology/Hematology  PCP:  [name], MD [address] [place] [address] [phone]   REASON FOR VISIT:  Follow-up for anal cancer  PRIOR THERAPY: 5-FU, mitomycin and Aloxi x 1 cycles from 02/03/2019 to 03/03/2019  NGS Results: Not done  CURRENT THERAPY: Observation  BRIEF ONCOLOGIC HISTORY:  Oncology History  Anal cancer ([place])  01/10/2019 Initial Diagnosis   Anal cancer ([place])   01/16/2019 Cancer Staging   Staging form: Anus, AJCC 8th Edition - Clinical: Stage Unknown (cTX, cN0, cM0) - Signed by Derek Jack, MD on 01/16/2019   02/03/2019 -  Chemotherapy   The patient had palonosetron (ALOXI) injection 0.25 mg, 0.25 mg, Intravenous,  Once, 1 of 1 cycle Administration: 0.25 mg (02/03/2019), 0.25 mg (03/03/2019) mitoMYcin (MUTAMYCIN) chemo injection 20 mg, 10.1 mg/m2 = 19.5 mg, Intravenous,  Once, 1 of 1 cycle Administration: 20 mg (02/03/2019), 20 mg (03/03/2019) fluorouracil (ADRUCIL) 7,900 mg in sodium chloride 0.9 % 92 mL chemo infusion, 1,000 mg/m2/day = 7,900 mg, Intravenous, 4D (96 hours ), 1 of 1 cycle Administration: 7,900 mg (02/03/2019), 7,900 mg (03/03/2019)  for chemotherapy treatment.    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  The Common Hereditary Gene Panel offered by [facility] includes sequencing and/or deletion duplication testing of the following 48 genes: APC, ATM, AXIN2, BARD1, BMPR1A, BRCA1, BRCA2, BRIP1, CDH1, CDK4, CDKN2A (p14ARF), CDKN2A (p16INK4a), CHEK2, CTNNA1, DICER1, EPCAM (Deletion/duplication testing only), GREM1 (promoter region deletion/duplication testing only), KIT, MEN1, MLH1, MSH2, MSH3, MSH6, MUTYH, NBN, NF1, NHTL1, PALB2, PDGFRA, PMS2, POLD1, POLE, PTEN, RAD50, RAD51C, RAD51D, RNF43, SDHB, SDHC, SDHD, SMAD4, SMARCA4. STK11, TP53, TSC1,  TSC2, and VHL.  The following genes were evaluated for sequence changes only: SDHA and HOXB13 c.251G>A variant only. The report date is 08/07/2019.     CANCER STAGING: Cancer Staging Anal cancer ([place]) Staging form: Anus, AJCC 8th Edition - Clinical: Stage Unknown (cTX, cN0, cM0) - Signed by Derek Jack, MD on 01/16/2019   INTERVAL HISTORY:  Amber Williams, a 44 y.o. female, returns for routine follow-up of her anal cancer. Amber Williams was last seen on 10/08/2019.  Today she is accompanied by her mother. She reports feeling okay. She has 3-4 BM's daily and sees blood in the commode every other day.   REVIEW OF SYSTEMS:  Review of Systems  Constitutional: Positive for appetite change (50%) and fatigue (depleted).  Musculoskeletal: Positive for arthralgias (3/10 legs pain).  Neurological: Positive for extremity weakness (R leg weakness).  Psychiatric/Behavioral: Positive for depression (occasional) and sleep disturbance. The patient is nervous/anxious (occasional).   All other systems reviewed and are negative.   PAST MEDICAL/SURGICAL HISTORY:  Past Medical History:  Diagnosis Date  . Anal cancer ([place]) 01/10/2019  . Arthritis   . Depression   . DVT (deep venous thrombosis) ([place]) 2013  . Essential hypertension   . Family history of breast cancer   . Gait abnormality 08/03/2016  . History of blood transfusion 2013  . Hyperlipidemia   . Hypothyroidism   . Hypothyroidism   . Multiple sclerosis ([place])   . Neurogenic bladder 08/03/2016  . Port-A-Cath in place 01/23/2019  . PTSD (post-traumatic stress disorder)   . Right foot drop 08/03/2016  . Seasonal allergies   . Ulcer    Past Surgical History:  Procedure Laterality Date  . COLON SURGERY  Colonoscopy  . FLEXIBLE SIGMOIDOSCOPY N/A 06/25/2019   Procedure: FLEXIBLE SIGMOIDOSCOPY ANOSCOPY;  Surgeon: Aviva Signs, MD;  Location: [facility];  Service: General;  Laterality: N/A;  . GASTRIC BYPASS  2010  . HEMORRHOID SURGERY N/A  12/25/2018   Procedure: EXTENSIVE HEMORRHOIDECTOMY;  Surgeon: Aviva Signs, MD;  Location: [facility];  Service: General;  Laterality: N/A;  . INTERSTIM IMPLANT PLACEMENT  2014  . PORTACATH PLACEMENT      SOCIAL HISTORY:  Social History   Socioeconomic History  . Marital status: Single    Spouse name: Ricky  . Number of children: 0  . Years of education: 64  . Highest education level: Associate degree: occupational, Hotel manager, or vocational program  Occupational History  . Occupation: Disability  Tobacco Use  . Smoking status: Former Smoker    Packs/day: 1.50    Years: 15.00    Pack years: 22.50    Types: Cigarettes    Start date: 1993    Quit date: 01/30/2009    Years since quitting: 11.0  . Smokeless tobacco: Never Used  Vaping Use  . Vaping Use: Never used  Substance and Sexual Activity  . 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 Drug use: No  . Sexual activity: Not on file  Other Topics Concern  . Not on file  Social History Narrative   Lives at home with her boyfriend   Right-handed   Caffeine: 2 glasses per day   One story home   Social Determinants of Health   Financial Resource Strain:   . Difficulty of Paying Living Expenses: Not on file  Food Insecurity:   . Worried About Charity fundraiser in the Last Year: Not on file  . Ran Out of Food in the Last Year: Not on file  Transportation Needs:   . Lack of Transportation (Medical): Not on file  . Lack of Transportation (Non-Medical): Not on file  Physical Activity:   . Days of Exercise per Week: Not on file  . Minutes of Exercise per Session: Not on file  Stress:   . Feeling of Stress : Not on file  Social Connections:   . Frequency of Communication with Friends and Family: Not on file  . Frequency of Social Gatherings with Friends and Family: Not on file  . Attends Religious Services: Not on file  . Active Member of Clubs or Organizations: Not on file  . Attends Archivist  Meetings: Not on file  . Marital Status: Not on file  Intimate Partner Violence:   . Fear of Current or Ex-Partner: Not on file  . Emotionally Abused: Not on file  . Physically Abused: Not on file  . Sexually Abused: Not on file    FAMILY HISTORY:  Family History  Problem Relation Age of Onset  . Breast cancer Mother 20  . Hypertension Father   . Diabetes Father   . Hypertension Sister   . Miscarriages / Stillbirths Sister   . Breast cancer Sister 52  . Heart disease Maternal Grandmother   . Diabetes Maternal Grandmother   . Heart disease Maternal Grandfather   . Diabetes Paternal Grandmother   . Cancer Paternal Grandmother        unknown form of cancer  . Heart disease Paternal Grandfather   . Stroke Paternal Grandfather     CURRENT MEDICATIONS:  Current Outpatient Medications  Medication Sig Dispense Refill  . ALLERGY RELIEF 180 MG tablet Take 180 mg by  mouth daily as needed for allergies.     [name] ALTAVERA 0.15-30 MG-MCG tablet TAKE 1 TABLET BY MOUTH EVERY DAY 84 tablet 0  . amitriptyline (ELAVIL) 50 MG tablet Take 1 tablet (50 mg total) by mouth at bedtime. 30 tablet 1  . aspirin 81 MG EC tablet Take 81 mg by mouth daily.    . Cholecalciferol (VITAMIN D3) 5000 units CAPS Take 5,000 Units by mouth every morning.    . escitalopram (LEXAPRO) 10 MG tablet TAKE 1 TABLET DAILY 90 tablet 0  . flurazepam (DALMANE) 30 MG capsule Take 1 capsule (30 mg total) by mouth at bedtime. 30 capsule 2  . furosemide (LASIX) 20 MG tablet TAKE 1 TABLET BY MOUTH EVERY DAY AS NEEDED 30 tablet 1  . levothyroxine (SYNTHROID) 112 MCG tablet TAKE 1 TABLET BY MOUTH DAILY BEFORE BREAKFAST 90 tablet 1  . loperamide (IMODIUM) 1 MG/5ML solution Take 6 mg by mouth as needed for diarrhea or loose stools.     . mometasone (NASONEX) 50 MCG/ACT nasal spray Place 2 sprays into the nose 2 (two) times daily as needed (allergies).     [name] omeprazole (PRILOSEC) 20 MG capsule Take 1 capsule (20 mg total) by mouth daily.  90 capsule 3  . traZODone (DESYREL) 150 MG tablet USE FROM 1/3 TO 1 TABLET ASNEEDED FOR SLEEP 90 tablet 3  . vitamin B-12 (CYANOCOBALAMIN) 500 MCG tablet Take 500 mcg by mouth daily.     No current facility-administered medications for this visit.    ALLERGIES:  Allergies  Allergen Reactions  . Other Hives    Plastic Tape  . Tecfidera [Dimethyl Fumarate] Itching    PHYSICAL EXAM:  Performance status (ECOG): 1 - Symptomatic but completely ambulatory  Vitals:   02/11/20 1449  BP: 128/65  Pulse: (!) 113  Resp: 17  Temp: (!) 96.9 F (36.1 C)  SpO2: 96%   Wt Readings from Last 3 Encounters:  02/11/20 187 lb 12.8 oz (85.2 kg)  02/03/20 189 lb (85.7 kg)  01/19/20 189 lb (85.7 kg)   Physical Exam Vitals reviewed.  Constitutional:      Appearance: Normal appearance. She is obese.  Musculoskeletal:     Comments: External anal sphincter not enlarged, good tone  Neurological:     General: No focal deficit present.     Mental Status: She is alert and oriented to person, place, and time.  Psychiatric:        Mood and Affect: Mood normal.        Behavior: Behavior normal.      LABORATORY DATA:  I have reviewed the labs as listed.  CBC Latest Ref Rng & Units 02/04/2020 01/02/2020 08/14/2019  WBC 4.0 - 10.5 K/uL 11.9(H) 5.4 7.6  Hemoglobin 12.0 - 15.0 g/dL 13.6 12.9 13.0  Hematocrit 36 - 46 % 42.2 40.3 41.1  Platelets 150 - 400 K/uL 255 281 223   CMP Latest Ref Rng & Units 02/04/2020 01/02/2020 08/14/2019  Glucose 70 - 99 mg/dL 63(L) 72 52(L)  BUN 6 - 20 mg/dL _0 Creatinine 0.44 - 1.00 mg/dL 0.78 0.86 0.85  Sodium 135 - 145 mmol/L 138 142 140  Potassium 3.5 - 5.1 mmol/L 3.8 4.5 4.1  Chloride 98 - 111 mmol/L 104 105 109  CO2 22 - 32 mmol/L _1 Calcium 8.9 - 10.3 mg/dL 10.0 9.6 9.3  Total Protein 6.5 - 8.1 g/dL 6.3(L) 5.5(L) 5.4(L)  Total Bilirubin 0.3 - 1.2 mg/dL 0.6 0.3 0.4  Alkaline Phos 38 - 126 U/L 62 81 63  AST 15 - 41 U/L 27 17 14(L)  ALT 0 - 44 U/L 35 12 14      DIAGNOSTIC IMAGING:  I have independently reviewed the scans and discussed with the patient. No results found.   ASSESSMENT:  1. Anal squamous cell carcinoma, p16 positive: -XRT and 5-FU and mitomycin from 02/03/2019 through 03/03/2019. -Sigmoidoscopy on 06/25/2019 by Dr. Arnoldo Morale did not reveal any abnormalities. -We reviewed PET scan from 10/06/2019 which showed mild decrease in anorectal hypermetabolic activity since prior study, SUV 6.2 compared to 8.6 previously. No hypermetabolic lymph nodes or metastatic disease.  2. Family history: -Mother had breast cancer 3 years ago. Sister was diagnosed with breast cancer. Genetic testing was negative.  3.  Multiple sclerosis: -She has weakness in her left leg from multiple sclerosis. -Treated with multiple agents in the past including Tecfidera, Betaseron, Avonex, Copaxone and mitoxantrone. -She has tried Ocrevus which helped in the past.   PLAN:  1. Anal squamous cell carcinoma, p16 positive: -Examination of the anal orifice and anal canal did not show any evidence of recurrence. -Reviewed labs from 02/04/2020 which showed normal LFTs, kidney function and CBC. -Recommend follow-up with Dr. Arnoldo Morale for anoscopy. -RTC 6 months for follow-up with labs.  2. Multiple sclerosis: -She is seeing her neurologist for this.   Orders placed this encounter:  No orders of the defined types were placed in this encounter.    Derek Jack, MD [place] [phone]   I, Milinda Antis, am acting as a scribe for Dr. Sanda Linger.  I, Derek Jack MD, have reviewed the above documentation for accuracy and completeness, and I agree with the above.

## 2020-02-12 ENCOUNTER — Other Ambulatory Visit: Payer: Self-pay

## 2020-02-12 ENCOUNTER — Ambulatory Visit (INDEPENDENT_AMBULATORY_CARE_PROVIDER_SITE_OTHER): Payer: Medicare HMO

## 2020-02-12 DIAGNOSIS — I499 Cardiac arrhythmia, unspecified: Secondary | ICD-10-CM | POA: Diagnosis not present

## 2020-02-12 LAB — ECHOCARDIOGRAM COMPLETE
Area-P 1/2: 4.71 cm2
S' Lateral: 3.01 cm

## 2020-02-16 ENCOUNTER — Encounter: Payer: Self-pay | Admitting: Family Medicine

## 2020-02-16 ENCOUNTER — Telehealth: Payer: Self-pay | Admitting: *Deleted

## 2020-02-16 NOTE — Telephone Encounter (Signed)
Laurine Blazer, LPN  83/37/4451 4:60 AM EDT Back to Top    Notified, copy to pcp.

## 2020-02-16 NOTE — Telephone Encounter (Signed)
-----   Message from [name], MD sent at 02/15/2020  4:13 PM EDT ----- Results reviewed.  Please let her know that cardiac function is overall normal, LVEF 55 to 60% and normal RV contraction.  No significant valvular abnormalities.  Keep follow-up as scheduled.

## 2020-02-18 ENCOUNTER — Ambulatory Visit: Payer: Medicare HMO | Admitting: Cardiology

## 2020-02-18 ENCOUNTER — Ambulatory Visit: Payer: Medicare HMO | Admitting: *Deleted

## 2020-02-18 DIAGNOSIS — E039 Hypothyroidism, unspecified: Secondary | ICD-10-CM

## 2020-02-18 DIAGNOSIS — G4701 Insomnia due to medical condition: Secondary | ICD-10-CM

## 2020-02-18 DIAGNOSIS — G35 Multiple sclerosis: Secondary | ICD-10-CM

## 2020-02-18 NOTE — Patient Instructions (Signed)
Visit Information  Goals Addressed              This Visit's Progress     Patient Stated   .  COMPLETED: "I would like genetic testing for breast cancer' (pt-stated)        Current Barriers:  [name] Knowledge Deficits related to genetic testing . Film/video editor.   Nurse Case Manager Clinical Goal(s):  [name] Over the next 7 days, patient will talk with genetic testing scheduler at the [facility] to schedule appointment for genetic testing  Interventions:  . Chart reviewed . Previously discussed concerns with patient . Previously discussed family history o Mother with breast cancer at age 48 and sister at age 95. They have not had genetic testing.  [name] Reached out to [facility] [phone] o They will reach out to [facility] to schedule genetic testing. o They do genetic test once a month and it may be April before they have an opening . Updated patient by telephone and she expressed appreciation for the coordination  Patient Self Care Activities:  . Performs ADL's independently . Performs IADL's independently  Please see past updates related to this goal by clicking on the "Past Updates" button in the selected goal        Other   .  "I want to see a different neurologist"        [place] (see longitudinal plan of care for additional care plan information)  Current Barriers:  . Care Coordination needs related to neurology appointment in a patient with multiple sclerosis, depression, and hypothyroidism   Nurse Case Manager Clinical Goal(s):  [name] Over the next 10 days, patient will talk with neurology office about records release request . Over the next 90 days, patient will be scheduled with neurologist at [facility]  . Over the next 60 days, patient will reach out to [facility] referral dept or RNCM with ny questions or concerns regarding neurology referral  Interventions:  . Inter-disciplinary care team collaboration (see longitudinal plan of  care) . Chart reviewed including recent office notes and referral notes . Talked with patient by telephone . Discussed that she is currently being treated by [facility] and that she is unhappy and doesn't feel that her doctor is listening to her . Discussed referral to [facility] [phone] . Encouraged patient to obtain records release so that [facility] can review records for scheduling . Reviewed and discussed medications . Discussed difficulty with sleep  o Per patient may be related to current increase in stress . Confirmed that patient has RNCM contact number and will reach out with any nursing questions or concerns  Patient Self Care Activities:  . Performs ADL's independently . Performs IADL's independently . Unable to independently drive  Initial goal documentation     .  CCM Goal: Fall Prevention        Increase in falls in patient with multiple sclerosis  Current Barriers:  . Chronic Disease Management support and education needs related to multiple sclerosis  Nurse Case Manager Clinical Goal(s):  [name] Over the next 60 days, patient will report no falls . Over the next 60 days, patient will talk with RN Care Manager regarding MS and fall risk  Interventions:  . Fall history discussed. Patient has MS and foot drop and often falls because of leg weakness.  . Discussed assistive devices. Patient uses a rolling walker and has shoe inserts for foot drop. She doesn't always wear the  inserts in the winter because they are difficult to get into her winter boots . Discussed referral to a different neurologist, [facility] [phone] . Provided with CCM contact information and encouraged to reach out as needed  Patient Self Care Activities:  . Performs ADL's independently . Performs IADL's independently . Unable to independently drive  Please see past updates related to this goal by clicking on the "Past Updates" button in the selected goal      .  COMPLETED:  Hypothyroidism        Current Barriers:  . Chronic Disease Management support and education needs related to hypothyroidism  Nurse Case Manager Clinical Goal(s):  [name] Over the next 12 months, patient will continue to work with PCP to manage her hypothyroidism.   Interventions:  . Evaluation of current treatment plan related to hypothyroidism and patient's adherence to plan as established by provider. . Reviewed medications with patient and discussed synthroid . Discussed plans with patient for ongoing care management follow up and provided patient with direct contact information for care management team . Chart reviewed including recent office notes an labs . Advised patient to reach out to PCP with any s/s of hypo or hyperthyroidism  Patient Self Care Activities:  . Performs ADL's independently . Performs IADL's independently  Initial goal documentation     .  COMPLETED: Rectal Bleeding        Rectal bleeding with bowel movements in patient with anal cancer and anxiety/depression related to upcoming surgery.   Current Barriers:  [name] Knowledge Deficits related to cause of new rectal bleeding  Nurse Case Manager Clinical Goal(s):  [name] Over the next 30 days, patient will follow-up with surgeon, Dr Arnoldo Morale, to evaluate cause of rectal bleeding  Interventions:  . Evaluation of current treatment plan related to rectal bleeding/anal cancer and patient's adherence to plan as established by provider. . Discussed plans with patient for ongoing care management follow up and provided patient with direct contact information for care management team . Discussed scheduled surgery with Dr Arnoldo Morale next week . Encouraged patient to reach out to [facility] team as needed for nursing care needs and for psychosocial support  Patient Self Care Activities:  . Performs ADL's independently . Performs IADL's independently  Please see past updates related to this goal by clicking on the "Past Updates" button in the  selected goal         Patient verbalizes understanding of instructions provided today.   Follow-up Plan The care management team will reach out to the patient again over the next 30 days.   [name], BSN, RN-BC Embedded Chronic Care Manager [facility] [address] [facility] Direct Dial: [phone]

## 2020-02-18 NOTE — Chronic Care Management (AMB) (Signed)
Chronic Care Management   Follow Up Note   02/18/2020 Name: Amber Williams MRN: [id number] DOB: 02/05/1976  Referred by: [name], MD Reason for referral : Chronic Care Management (RN follow up)   Amber Williams is a 44 y.o. year old female who is a primary care patient of [name], [name], MD. The CCM team was consulted for assistance with chronic disease management and care coordination needs.    Review of patient status, including review of consultants reports, relevant laboratory and other test results, and collaboration with appropriate care team members and the patient's provider was performed as part of comprehensive patient evaluation and provision of chronic care management services.    SDOH (Social Determinants of Health) assessments performed: No See Care Plan activities for detailed interventions related to [facility])     Outpatient Encounter Medications as of 02/18/2020  Medication Sig  . ALLERGY RELIEF 180 MG tablet Take 180 mg by mouth daily as needed for allergies.   [name] ALTAVERA 0.15-30 MG-MCG tablet TAKE 1 TABLET BY MOUTH EVERY DAY  . amitriptyline (ELAVIL) 50 MG tablet Take 1 tablet (50 mg total) by mouth at bedtime.  [name] aspirin 81 MG EC tablet Take 81 mg by mouth daily.  . Cholecalciferol (VITAMIN D3) 5000 units CAPS Take 5,000 Units by mouth every morning.  . escitalopram (LEXAPRO) 10 MG tablet TAKE 1 TABLET DAILY  . flurazepam (DALMANE) 30 MG capsule Take 1 capsule (30 mg total) by mouth at bedtime.  . furosemide (LASIX) 20 MG tablet TAKE 1 TABLET BY MOUTH EVERY DAY AS NEEDED  . levothyroxine (SYNTHROID) 112 MCG tablet TAKE 1 TABLET BY MOUTH DAILY BEFORE BREAKFAST  . loperamide (IMODIUM) 1 MG/5ML solution Take 6 mg by mouth as needed for diarrhea or loose stools.   . mometasone (NASONEX) 50 MCG/ACT nasal spray Place 2 sprays into the nose 2 (two) times daily as needed (allergies).   [name] omeprazole (PRILOSEC) 20 MG capsule Take 1 capsule (20 mg total) by mouth  daily.  . traZODone (DESYREL) 150 MG tablet USE FROM 1/3 TO 1 TABLET ASNEEDED FOR SLEEP  . vitamin B-12 (CYANOCOBALAMIN) 500 MCG tablet Take 500 mcg by mouth daily.   No facility-administered encounter medications on file as of 02/18/2020.      Goals Addressed    .  "I want to see a different neurologist"        [place] (see longitudinal plan of care for additional care plan information)  Current Barriers:  . Care Coordination needs related to neurology appointment in a patient with multiple sclerosis, depression, and hypothyroidism   Nurse Case Manager Clinical Goal(s):  [name] Over the next 10 days, patient will talk with neurology office about records release request . Over the next 90 days, patient will be scheduled with neurologist at [facility]  . Over the next 60 days, patient will reach out to [facility] referral dept or RNCM with ny questions or concerns regarding neurology referral  Interventions:  . Inter-disciplinary care team collaboration (see longitudinal plan of care) . Chart reviewed including recent office notes and referral notes . Talked with patient by telephone . Discussed that she is currently being treated by [facility] and that she is unhappy and doesn't feel that her doctor is listening to her . Discussed referral to [facility] [phone] . Encouraged patient to obtain records release so that [facility] can review records for scheduling . Reviewed and discussed medications . Discussed difficulty with sleep  o Per  patient may be related to current increase in stress . Confirmed that patient has RNCM contact number and will reach out with any nursing questions or concerns  Patient Self Care Activities:  . Performs ADL's independently . Performs IADL's independently . Unable to independently drive  Initial goal documentation     .  CCM Goal: Fall Prevention        Increase in falls in patient with multiple sclerosis  Current Barriers:   . Chronic Disease Management support and education needs related to multiple sclerosis  Nurse Case Manager Clinical Goal(s):  [name] Over the next 60 days, patient will report no falls . Over the next 60 days, patient will talk with RN Care Manager regarding MS and fall risk  Interventions:  . Fall history discussed. Patient has MS and foot drop and often falls because of leg weakness.  . Discussed assistive devices. Patient uses a rolling walker and has shoe inserts for foot drop. She doesn't always wear the inserts in the winter because they are difficult to get into her winter boots . Discussed referral to a different neurologist, [facility] [phone] . Provided with CCM contact information and encouraged to reach out as needed  Patient Self Care Activities:  . Performs ADL's independently . Performs IADL's independently . Unable to independently drive  Please see past updates related to this goal by clicking on the "Past Updates" button in the selected goal        Plan:  The care management team will reach out to the patient again over the next 30 days.    [name], BSN, RN-BC Embedded Chronic Care Manager [facility] [place] [facility] Direct Dial: [phone]

## 2020-02-24 ENCOUNTER — Encounter: Payer: Self-pay | Admitting: General Surgery

## 2020-02-24 ENCOUNTER — Ambulatory Visit (INDEPENDENT_AMBULATORY_CARE_PROVIDER_SITE_OTHER): Payer: Medicare HMO | Admitting: General Surgery

## 2020-02-24 ENCOUNTER — Other Ambulatory Visit: Payer: Self-pay

## 2020-02-24 VITALS — BP 122/78 | HR 81 | Temp 98.7°F | Resp 12 | Ht 65.0 in | Wt 193.0 lb

## 2020-02-24 DIAGNOSIS — C210 Malignant neoplasm of anus, unspecified: Secondary | ICD-10-CM

## 2020-02-24 MED ORDER — NA SULFATE-K SULFATE-MG SULF 17.5-3.13-1.6 GM/177ML PO SOLN: 1.0000 | Freq: Once | ORAL | 0 refills | Status: AC

## 2020-02-25 ENCOUNTER — Other Ambulatory Visit: Payer: Self-pay | Admitting: Family Medicine

## 2020-02-25 NOTE — Progress Notes (Signed)
Amber Williams; [id number]; 1975-08-01   HPI Patient is a 44 year old white female who was referred to my care by Dr. Delton Coombes of oncology for an endoscopic evaluation.  She has recently been treated for squamous cell carcinoma of the anus and is here for a surveillance follow-up.  In addition, she has had some blood per rectum and she has not had a colonoscopy for some time now.  She denies any diarrhea.  She does feel some fullness in the rectum and needs to take a cathartic to help empty her bowels.  She denies any incontinence.  Her last flexible sigmoidoscopy in February 2021 was unremarkable. Past Medical History:  Diagnosis Date  . Anal cancer ([place]) 01/10/2019  . Arthritis   . Depression   . DVT (deep venous thrombosis) ([place]) 2013  . Essential hypertension   . Family history of breast cancer   . Gait abnormality 08/03/2016  . History of blood transfusion 2013  . Hyperlipidemia   . Hypothyroidism   . Hypothyroidism   . Multiple sclerosis ([place])   . Neurogenic bladder 08/03/2016  . Port-A-Cath in place 01/23/2019  . PTSD (post-traumatic stress disorder)   . Right foot drop 08/03/2016  . Seasonal allergies   . Ulcer     Past Surgical History:  Procedure Laterality Date  . COLON SURGERY     Colonoscopy  . FLEXIBLE SIGMOIDOSCOPY N/A 06/25/2019   Procedure: FLEXIBLE SIGMOIDOSCOPY ANOSCOPY;  Surgeon: Aviva Signs, MD;  Location: [facility];  Service: General;  Laterality: N/A;  . GASTRIC BYPASS  2010  . HEMORRHOID SURGERY N/A 12/25/2018   Procedure: EXTENSIVE HEMORRHOIDECTOMY;  Surgeon: Aviva Signs, MD;  Location: [facility];  Service: General;  Laterality: N/A;  . INTERSTIM IMPLANT PLACEMENT  2014  . PORTACATH PLACEMENT      Family History  Problem Relation Age of Onset  . Breast cancer Mother 43  . Hypertension Father   . Diabetes Father   . Hypertension Sister   . Miscarriages / Stillbirths Sister   . Breast cancer Sister 74  . Heart disease Maternal Grandmother   . Diabetes  Maternal Grandmother   . Heart disease Maternal Grandfather   . Diabetes Paternal Grandmother   . Cancer Paternal Grandmother        unknown form of cancer  . Heart disease Paternal Grandfather   . Stroke Paternal Grandfather     Current Outpatient Medications on File Prior to Visit  Medication Sig Dispense Refill  . ALLERGY RELIEF 180 MG tablet Take 180 mg by mouth daily as needed for allergies.     [name] ALTAVERA 0.15-30 MG-MCG tablet TAKE 1 TABLET BY MOUTH EVERY DAY 84 tablet 0  . amitriptyline (ELAVIL) 50 MG tablet Take 1 tablet (50 mg total) by mouth at bedtime. 30 tablet 1  . aspirin 81 MG EC tablet Take 81 mg by mouth daily.    . Cholecalciferol (VITAMIN D3) 5000 units CAPS Take 5,000 Units by mouth every morning.    . escitalopram (LEXAPRO) 10 MG tablet TAKE 1 TABLET DAILY 90 tablet 0  . flurazepam (DALMANE) 30 MG capsule Take 1 capsule (30 mg total) by mouth at bedtime. 30 capsule 2  . furosemide (LASIX) 20 MG tablet TAKE 1 TABLET BY MOUTH EVERY DAY AS NEEDED 30 tablet 1  . levothyroxine (SYNTHROID) 112 MCG tablet TAKE 1 TABLET BY MOUTH DAILY BEFORE BREAKFAST 90 tablet 1  . loperamide (IMODIUM) 1 MG/5ML solution Take 6 mg by mouth as needed for diarrhea or loose stools.     [name]  mometasone (NASONEX) 50 MCG/ACT nasal spray Place 2 sprays into the nose 2 (two) times daily as needed (allergies).     [name] omeprazole (PRILOSEC) 20 MG capsule Take 1 capsule (20 mg total) by mouth daily. 90 capsule 3  . traZODone (DESYREL) 150 MG tablet USE FROM 1/3 TO 1 TABLET ASNEEDED FOR SLEEP 90 tablet 3  . vitamin B-12 (CYANOCOBALAMIN) 500 MCG tablet Take 500 mcg by mouth daily.     No current facility-administered medications on file prior to visit.    Allergies  Allergen Reactions  . Other Hives    Plastic Tape  . 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 Packs/day: 1.50  . Years: 15.00  . Pack years: 22.50  . Types: Cigarettes  . Start date: 72  . Quit date: 01/30/2009  . Years since quitting: 11.0  Smokeless Tobacco Never Used    Review of Systems  Constitutional: Positive for malaise/fatigue.  HENT: Positive for sinus pain.   Eyes: Negative.   Respiratory: Negative.   Cardiovascular: Negative.   Gastrointestinal: Positive for blood in stool.  Genitourinary: Positive for frequency and urgency.  Musculoskeletal: Negative.   Skin: Negative.   Neurological: Negative.   Endo/Heme/Allergies: Negative.   Psychiatric/Behavioral: Negative.     Objective   Vitals:   02/24/20 1448  BP: 122/78  Pulse: 81  Resp: 12  Temp: 98.7 F (37.1 C)  SpO2: 97%    Physical Exam Vitals reviewed.  Constitutional:      Appearance: Normal appearance. She is not ill-appearing.  HENT:     Head: Normocephalic and atraumatic.  Cardiovascular:     Rate and Rhythm: Normal rate and regular rhythm.     Heart sounds: Normal heart sounds. No murmur heard.  No friction rub. No gallop.   Pulmonary:     Effort: Pulmonary effort is normal. No respiratory distress.     Breath sounds: Normal breath sounds. No stridor. No wheezing, rhonchi or rales.  Skin:    General: Skin is warm and dry.  Neurological:     Mental Status: She is alert and oriented to person, place, and time.     Assessment  History of anal carcinoma, blood per rectum Plan   We will schedule patient for a colonoscopy under propofol anesthesia.  The risks and benefits of the procedure including bleeding and perforation were fully explained to the patient, who gave informed consent.  Suprep has been prescribed.

## 2020-02-26 ENCOUNTER — Telehealth: Payer: Self-pay | Admitting: Family Medicine

## 2020-02-26 MED ORDER — MAGNESIUM CITRATE PO SOLN: ORAL | 0 refills | Status: DC

## 2020-02-26 NOTE — Telephone Encounter (Signed)
Pt called and states that the suprep is $100 and she can not afford that, can we send in something else. Per Dr. Arnoldo Morale can do citroma - mag citrate 1 /2 bottle at noon and 1/2 at 6 pm. Med sent to pharm and pt made aware.

## 2020-02-26 NOTE — H&P (Signed)
Amber Williams; [id number]; [birth year]   HPI Patient is a 44 year old white female who was referred to my care by Dr. Delton Coombes of oncology for an endoscopic evaluation.  She has recently been treated for squamous cell carcinoma of the anus and is here for a surveillance follow-up.  In addition, she has had some blood per rectum and she has not had a colonoscopy for some time now.  She denies any diarrhea.  She does feel some fullness in the rectum and needs to take a cathartic to help empty her bowels.  She denies any incontinence.  Her last flexible sigmoidoscopy in February 2021 was unremarkable. Past Medical History:  Diagnosis Date  . Anal cancer ([place]) 01/10/2019  . Arthritis   . Depression   . DVT (deep venous thrombosis) ([place]) 2013  . Essential hypertension   . Family history of breast cancer   . Gait abnormality 08/03/2016  . History of blood transfusion 2013  . Hyperlipidemia   . Hypothyroidism   . Hypothyroidism   . Multiple sclerosis ([place])   . Neurogenic bladder 08/03/2016  . Port-A-Cath in place 01/23/2019  . PTSD (post-traumatic stress disorder)   . Right foot drop 08/03/2016  . Seasonal allergies   . Ulcer     Past Surgical History:  Procedure Laterality Date  . COLON SURGERY     Colonoscopy  . FLEXIBLE SIGMOIDOSCOPY N/A 06/25/2019   Procedure: FLEXIBLE SIGMOIDOSCOPY ANOSCOPY;  Surgeon: Aviva Signs, MD;  Location: [facility];  Service: General;  Laterality: N/A;  . GASTRIC BYPASS  2010  . HEMORRHOID SURGERY N/A 12/25/2018   Procedure: EXTENSIVE HEMORRHOIDECTOMY;  Surgeon: Aviva Signs, MD;  Location: [facility];  Service: General;  Laterality: N/A;  . INTERSTIM IMPLANT PLACEMENT  2014  . PORTACATH PLACEMENT      Family History  Problem Relation Age of Onset  . Breast cancer Mother 17  . Hypertension Father   . Diabetes Father   . Hypertension Sister   . Miscarriages / Stillbirths Sister   . Breast cancer Sister 61  . Heart disease Maternal Grandmother   . Diabetes  Maternal Grandmother   . Heart disease Maternal Grandfather   . Diabetes Paternal Grandmother   . Cancer Paternal Grandmother        unknown form of cancer  . Heart disease Paternal Grandfather   . Stroke Paternal Grandfather     Current Outpatient Medications on File Prior to Visit  Medication Sig Dispense Refill  . ALLERGY RELIEF 180 MG tablet Take 180 mg by mouth daily as needed for allergies.     [name] ALTAVERA 0.15-30 MG-MCG tablet TAKE 1 TABLET BY MOUTH EVERY DAY 84 tablet 0  . amitriptyline (ELAVIL) 50 MG tablet Take 1 tablet (50 mg total) by mouth at bedtime. 30 tablet 1  . aspirin 81 MG EC tablet Take 81 mg by mouth daily.    . Cholecalciferol (VITAMIN D3) 5000 units CAPS Take 5,000 Units by mouth every morning.    . escitalopram (LEXAPRO) 10 MG tablet TAKE 1 TABLET DAILY 90 tablet 0  . flurazepam (DALMANE) 30 MG capsule Take 1 capsule (30 mg total) by mouth at bedtime. 30 capsule 2  . furosemide (LASIX) 20 MG tablet TAKE 1 TABLET BY MOUTH EVERY DAY AS NEEDED 30 tablet 1  . levothyroxine (SYNTHROID) 112 MCG tablet TAKE 1 TABLET BY MOUTH DAILY BEFORE BREAKFAST 90 tablet 1  . loperamide (IMODIUM) 1 MG/5ML solution Take 6 mg by mouth as needed for diarrhea or loose stools.     [name]  mometasone (NASONEX) 50 MCG/ACT nasal spray Place 2 sprays into the nose 2 (two) times daily as needed (allergies).     [name] omeprazole (PRILOSEC) 20 MG capsule Take 1 capsule (20 mg total) by mouth daily. 90 capsule 3  . traZODone (DESYREL) 150 MG tablet USE FROM 1/3 TO 1 TABLET ASNEEDED FOR SLEEP 90 tablet 3  . vitamin B-12 (CYANOCOBALAMIN) 500 MCG tablet Take 500 mcg by mouth daily.     No current facility-administered medications on file prior to visit.    Allergies  Allergen Reactions  . Other Hives    Plastic Tape  . 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 Packs/day: 1.50  . Years: 15.00  . Pack years: 22.50  . Types: Cigarettes  . Start date: 19  . Quit date: 01/30/2009  . Years since quitting: 11.0  Smokeless Tobacco Never Used    Review of Systems  Constitutional: Positive for malaise/fatigue.  HENT: Positive for sinus pain.   Eyes: Negative.   Respiratory: Negative.   Cardiovascular: Negative.   Gastrointestinal: Positive for blood in stool.  Genitourinary: Positive for frequency and urgency.  Musculoskeletal: Negative.   Skin: Negative.   Neurological: Negative.   Endo/Heme/Allergies: Negative.   Psychiatric/Behavioral: Negative.     Objective   Vitals:   02/24/20 1448  BP: 122/78  Pulse: 81  Resp: 12  Temp: 98.7 F (37.1 C)  SpO2: 97%    Physical Exam Vitals reviewed.  Constitutional:      Appearance: Normal appearance. She is not ill-appearing.  HENT:     Head: Normocephalic and atraumatic.  Cardiovascular:     Rate and Rhythm: Normal rate and regular rhythm.     Heart sounds: Normal heart sounds. No murmur heard.  No friction rub. No gallop.   Pulmonary:     Effort: Pulmonary effort is normal. No respiratory distress.     Breath sounds: Normal breath sounds. No stridor. No wheezing, rhonchi or rales.  Skin:    General: Skin is warm and dry.  Neurological:     Mental Status: She is alert and oriented to person, place, and time.     Assessment  History of anal carcinoma, blood per rectum Plan   We will schedule patient for a colonoscopy under propofol anesthesia.  The risks and benefits of the procedure including bleeding and perforation were fully explained to the patient, who gave informed consent.  Suprep has been prescribed.

## 2020-03-01 NOTE — Patient Instructions (Signed)
Amber Williams  03/01/2020     @PREFPERIOPPHARMACY @   Your procedure is scheduled on  03/04/2020   Report to [facility] at  [place].M.  Call this number if you have problems the morning of surgery:  [phone]   Remember:  Follow the diet and prep instructions given to you by the office.                        Take these medicines the morning of surgery with A SIP OF WATER  Altareva, lexapro, levothyroxine.    Do not wear jewelry, make-up or nail polish.  Do not wear lotions, powders, or perfumes. Please wear deodorant and brush your teeth.  Do not shave 48 hours prior to surgery.  Men may shave face and neck.  Do not bring valuables to the hospital.  [facility] is not responsible for any belongings or valuables.  Contacts, dentures or bridgework may not be worn into surgery.  Leave your suitcase in the car.  After surgery it may be brought to your room.  For patients admitted to the hospital, discharge time will be determined by your treatment team.  Patients discharged the day of surgery will not be allowed to drive home.   Name and phone number of your driver:   family Special instructions:  DO NOT smoke the morning of your procedure.  Please read over the following fact sheets that you were given. Anesthesia Post-op Instructions and Care and Recovery After Surgery       Colonoscopy, Adult, Care After This sheet gives you information about how to care for yourself after your procedure. Your health care provider may also give you more specific instructions. If you have problems or questions, contact your health care provider. What can I expect after the procedure? After the procedure, it is common to have:  A small amount of blood in your stool for 24 hours after the procedure.  Some gas.  Mild cramping or bloating of your abdomen. Follow these instructions at home: Eating and drinking   Drink enough fluid to keep your urine pale  yellow.  Follow instructions from your health care provider about eating or drinking restrictions.  Resume your normal diet as instructed by your health care provider. Avoid heavy or fried foods that are hard to digest. Activity  Rest as told by your health care provider.  Avoid sitting for a long time without moving. Get up to take short walks every 1-2 hours. This is important to improve blood flow and breathing. Ask for help if you feel weak or unsteady.  Return to your normal activities as told by your health care provider. Ask your health care provider what activities are safe for you. Managing cramping and bloating   Try walking around when you have cramps or feel bloated.  Apply heat to your abdomen as told by your health care provider. Use the heat source that your health care provider recommends, such as a moist heat pack or a heating pad. ? Place a towel between your skin and the heat source. ? Leave the heat on for 20-30 minutes. ? Remove the heat if your skin turns bright red. This is especially important if you are unable to feel pain, heat, or cold. You may have a greater risk of getting burned. General instructions  For the first 24 hours after the procedure: ? Do not drive or use  machinery. ? Do not sign important documents. ? Do not drink alcohol. ? Do your regular daily activities at a slower pace than normal. ? Eat soft foods that are easy to digest.  Take over-the-counter and prescription medicines only as told by your health care provider.  Keep all follow-up visits as told by your health care provider. This is important. Contact a health care provider if:  You have blood in your stool 2-3 days after the procedure. Get help right away if you have:  More than a small spotting of blood in your stool.  Large blood clots in your stool.  Swelling of your abdomen.  Nausea or vomiting.  A fever.  Increasing pain in your abdomen that is not relieved with  medicine. Summary  After the procedure, it is common to have a small amount of blood in your stool. You may also have mild cramping and bloating of your abdomen.  For the first 24 hours after the procedure, do not drive or use machinery, sign important documents, or drink alcohol.  Get help right away if you have a lot of blood in your stool, nausea or vomiting, a fever, or increased pain in your abdomen. This information is not intended to replace advice given to you by your health care provider. Make sure you discuss any questions you have with your health care provider. Document Revised: 11/11/2018 Document Reviewed: 11/11/2018 Elsevier Patient Education  [place] After These instructions provide you with information about caring for yourself after your procedure. Your health care provider may also give you more specific instructions. Your treatment has been planned according to current medical practices, but problems sometimes occur. Call your health care provider if you have any problems or questions after your procedure. What can I expect after the procedure? After your procedure, you may:  Feel sleepy for several hours.  Feel clumsy and have poor balance for several hours.  Feel forgetful about what happened after the procedure.  Have poor judgment for several hours.  Feel nauseous or vomit.  Have a sore throat if you had a breathing tube during the procedure. Follow these instructions at home: For at least 24 hours after the procedure:      Have a responsible adult stay with you. It is important to have someone help care for you until you are awake and alert.  Rest as needed.  Do not: ? Participate in activities in which you could fall or become injured. ? Drive. ? Use heavy machinery. ? Drink alcohol. ? Take sleeping pills or medicines that cause drowsiness. ? Make important decisions or sign legal documents. ? Take  care of children on your own. Eating and drinking  Follow the diet that is recommended by your health care provider.  If you vomit, drink water, juice, or soup when you can drink without vomiting.  Make sure you have little or no nausea before eating solid foods. General instructions  Take over-the-counter and prescription medicines only as told by your health care provider.  If you have sleep apnea, surgery and certain medicines can increase your risk for breathing problems. Follow instructions from your health care provider about wearing your sleep device: ? Anytime you are sleeping, including during daytime naps. ? While taking prescription pain medicines, sleeping medicines, or medicines that make you drowsy.  If you smoke, do not smoke without supervision.  Keep all follow-up visits as told by your health care provider. This is important.  Contact a health care provider if:  You keep feeling nauseous or you keep vomiting.  You feel light-headed.  You develop a rash.  You have a fever. Get help right away if:  You have trouble breathing. Summary  For several hours after your procedure, you may feel sleepy and have poor judgment.  Have a responsible adult stay with you for at least 24 hours or until you are awake and alert. This information is not intended to replace advice given to you by your health care provider. Make sure you discuss any questions you have with your health care provider. Document Revised: 07/16/2017 Document Reviewed: 08/08/2015 Elsevier Patient Education  [place].

## 2020-03-02 ENCOUNTER — Other Ambulatory Visit (HOSPITAL_COMMUNITY)
Admission: RE | Admit: 2020-03-02 | Discharge: 2020-03-02 | Disposition: A | Discharge status: Home / Self Care | Payer: Medicare HMO | Source: Ambulatory Visit | Attending: General Surgery | Admitting: General Surgery

## 2020-03-02 ENCOUNTER — Encounter (HOSPITAL_COMMUNITY): Payer: Self-pay

## 2020-03-02 ENCOUNTER — Other Ambulatory Visit: Payer: Self-pay

## 2020-03-02 ENCOUNTER — Encounter (HOSPITAL_COMMUNITY)
Admission: RE | Admit: 2020-03-02 | Discharge: 2020-03-02 | Disposition: A | Discharge status: Home / Self Care | Payer: Medicare HMO | Source: Ambulatory Visit | Attending: General Surgery | Admitting: General Surgery

## 2020-03-02 DIAGNOSIS — Z20822 Contact with and (suspected) exposure to covid-19: Secondary | ICD-10-CM | POA: Diagnosis not present

## 2020-03-02 DIAGNOSIS — Z01812 Encounter for preprocedural laboratory examination: Secondary | ICD-10-CM | POA: Diagnosis not present

## 2020-03-02 LAB — CBC WITH DIFFERENTIAL/PLATELET
Abs Immature Granulocytes: 0.03 10*3/uL (ref 0.00–0.07)
Basophils Absolute: 0.1 10*3/uL (ref 0.0–0.1)
Basophils Relative: 1 %
Eosinophils Absolute: 0.2 10*3/uL (ref 0.0–0.5)
Eosinophils Relative: 3 %
HCT: 40.5 % (ref 36.0–46.0)
Hemoglobin: 12.8 g/dL (ref 12.0–15.0)
Immature Granulocytes: 0 %
Lymphocytes Relative: 9 %
Lymphs Abs: 0.7 10*3/uL (ref 0.7–4.0)
MCH: 31.9 pg (ref 26.0–34.0)
MCHC: 31.6 g/dL (ref 30.0–36.0)
MCV: 101 fL — ABNORMAL HIGH (ref 80.0–100.0)
Monocytes Absolute: 0.7 10*3/uL (ref 0.1–1.0)
Monocytes Relative: 9 %
Neutro Abs: 5.9 10*3/uL (ref 1.7–7.7)
Neutrophils Relative %: 78 %
Platelets: 272 10*3/uL (ref 150–400)
RBC: 4.01 MIL/uL (ref 3.87–5.11)
RDW: 14.1 % (ref 11.5–15.5)
WBC: 7.5 10*3/uL (ref 4.0–10.5)
nRBC: 0 % (ref 0.0–0.2)

## 2020-03-02 LAB — COMPREHENSIVE METABOLIC PANEL
ALT: 17 U/L (ref 0–44)
AST: 18 U/L (ref 15–41)
Albumin: 3.7 g/dL (ref 3.5–5.0)
Alkaline Phosphatase: 71 U/L (ref 38–126)
Anion gap: 7 (ref 5–15)
BUN: 13 mg/dL (ref 6–20)
CO2: 24 mmol/L (ref 22–32)
Calcium: 9.3 mg/dL (ref 8.9–10.3)
Chloride: 105 mmol/L (ref 98–111)
Creatinine, Ser: 0.83 mg/dL (ref 0.44–1.00)
GFR, Estimated: >60 mL/min (ref >60)
Glucose, Bld: 87 mg/dL (ref 70–99)
Potassium: 3.6 mmol/L (ref 3.5–5.1)
Sodium: 136 mmol/L (ref 135–145)
Total Bilirubin: 0.4 mg/dL (ref 0.3–1.2)
Total Protein: 5.7 g/dL — ABNORMAL LOW (ref 6.5–8.1)

## 2020-03-02 LAB — HCG, SERUM, QUALITATIVE: Preg, Serum: NEGATIVE

## 2020-03-03 LAB — SARS CORONAVIRUS 2 (TAT 6-24 HRS): SARS Coronavirus 2: NEGATIVE

## 2020-03-04 ENCOUNTER — Ambulatory Visit (HOSPITAL_COMMUNITY): Payer: Medicare HMO | Admitting: Certified Registered"

## 2020-03-04 ENCOUNTER — Ambulatory Visit (HOSPITAL_COMMUNITY)
Admission: RE | Admit: 2020-03-04 | Discharge: 2020-03-04 | Disposition: A | Discharge status: Home / Self Care | Payer: Medicare HMO | Attending: General Surgery | Admitting: General Surgery

## 2020-03-04 ENCOUNTER — Other Ambulatory Visit: Payer: Self-pay

## 2020-03-04 ENCOUNTER — Encounter (HOSPITAL_COMMUNITY): Payer: Self-pay | Admitting: General Surgery

## 2020-03-04 ENCOUNTER — Encounter (HOSPITAL_COMMUNITY)
Admission: RE | Disposition: A | Discharge status: Home / Self Care | Payer: Self-pay | Source: Home / Self Care | Attending: General Surgery

## 2020-03-04 DIAGNOSIS — R195 Other fecal abnormalities: Secondary | ICD-10-CM

## 2020-03-04 DIAGNOSIS — Z888 Allergy status to other drugs, medicaments and biological substances status: Secondary | ICD-10-CM | POA: Insufficient documentation

## 2020-03-04 DIAGNOSIS — Z87891 Personal history of nicotine dependence: Secondary | ICD-10-CM | POA: Diagnosis not present

## 2020-03-04 DIAGNOSIS — Z79890 Hormone replacement therapy (postmenopausal): Secondary | ICD-10-CM | POA: Insufficient documentation

## 2020-03-04 DIAGNOSIS — Z7982 Long term (current) use of aspirin: Secondary | ICD-10-CM | POA: Insufficient documentation

## 2020-03-04 DIAGNOSIS — Z85048 Personal history of other malignant neoplasm of rectum, rectosigmoid junction, and anus: Secondary | ICD-10-CM

## 2020-03-04 DIAGNOSIS — Z79899 Other long term (current) drug therapy: Secondary | ICD-10-CM | POA: Diagnosis not present

## 2020-03-04 HISTORY — PX: COLONOSCOPY WITH PROPOFOL: SHX5780

## 2020-03-04 SURGERY — COLONOSCOPY WITH PROPOFOL
Anesthesia: General

## 2020-03-04 MED ORDER — LACTATED RINGERS IV SOLN
INTRAVENOUS | Status: DC | PRN
  Administered 2020-03-04: 1000 mL via INTRAVENOUS

## 2020-03-04 MED ORDER — SODIUM CHLORIDE 0.9% FLUSH: 10.0000 mL | INTRAVENOUS | Status: DC | PRN

## 2020-03-04 MED ORDER — STERILE WATER FOR IRRIGATION IR SOLN
Status: DC | PRN
  Administered 2020-03-04: 100 mL

## 2020-03-04 MED ORDER — CHLORHEXIDINE GLUCONATE CLOTH 2 % EX PADS: 6.0000 | MEDICATED_PAD | Freq: Once | CUTANEOUS | Status: DC

## 2020-03-04 MED ORDER — PROPOFOL 500 MG/50ML IV EMUL
INTRAVENOUS | Status: DC | PRN
  Administered 2020-03-04: 100 ug/kg/min via INTRAVENOUS

## 2020-03-04 MED ORDER — PROPOFOL 10 MG/ML IV BOLUS
INTRAVENOUS | Status: DC | PRN
  Administered 2020-03-04: 70 mg via INTRAVENOUS

## 2020-03-04 MED ORDER — LIDOCAINE HCL (CARDIAC) PF 100 MG/5ML IV SOSY
PREFILLED_SYRINGE | INTRAVENOUS | Status: DC | PRN
  Administered 2020-03-04: 70 mg via INTRAVENOUS

## 2020-03-04 MED ORDER — FLEET ENEMA 7-19 GM/118ML RE ENEM
1.0000 | ENEMA | Freq: Once | RECTAL | Status: AC
  Administered 2020-03-04: 1 via RECTAL
  Filled 2020-03-04: qty 1

## 2020-03-04 MED ORDER — LACTATED RINGERS IV SOLN: INTRAVENOUS | Status: DC

## 2020-03-04 MED ORDER — HEPARIN SOD (PORK) LOCK FLUSH 100 UNIT/ML IV SOLN
500.0000 [IU] | Freq: Once | INTRAVENOUS | Status: AC
  Administered 2020-03-04: 500 [IU] via INTRAVENOUS
  Filled 2020-03-04: qty 5

## 2020-03-04 NOTE — Op Note (Signed)
[facility] Patient Name: Amber Williams Procedure Date: 03/04/2020 7:13 AM MRN: [id number] Date of Birth: 1975/10/22 Attending MD: Aviva Signs , MD CSN: [id number] Age: 44 Admit Type: Outpatient Procedure:                Colonoscopy Indications:              Heme positive stool, Personal history of malignant                            neoplasm of the anal canal Providers:                Aviva Signs, MD, Lambert Mody, Casimer Bilis, Technician Referring MD:              Medicines:                Monitored Anesthesia Care Complications:            No immediate complications. Estimated Blood Loss:     Estimated blood loss: none. Procedure:                Pre-Anesthesia Assessment:                           - Prior to the procedure, a History and Physical                            was performed, and patient medications and                            allergies were reviewed. The patient is competent.                            The risks and benefits of the procedure and the                            sedation options and risks were discussed with the                            patient. All questions were answered and informed                            consent was obtained. Patient identification and                            proposed procedure were verified by the physician,                            the nurse and the anesthetist in the endoscopy                            suite. Mental Status Examination: alert and  oriented. Airway Examination: normal oropharyngeal                            airway and neck mobility. Respiratory Examination:                            clear to auscultation. CV Examination: RRR, no                            murmurs, no S3 or S4. Prophylactic Antibiotics: The                            patient does not require prophylactic antibiotics.                            Prior  Anticoagulants: The patient has taken no                            previous anticoagulant or antiplatelet agents. ASA                            Grade Assessment: III - A patient with severe                            systemic disease. After reviewing the risks and                            benefits, the patient was deemed in satisfactory                            condition to undergo the procedure. The anesthesia                            plan was to use deep sedation / analgesia.                            Immediately prior to administration of medications,                            the patient was re-assessed for adequacy to receive                            sedatives. The heart rate, respiratory rate, oxygen                            saturations, blood pressure, adequacy of pulmonary                            ventilation, and response to care were monitored                            throughout the procedure. The physical status of  the patient was re-assessed after the procedure.                           - Monitored anesthesia care under the supervision                            of an anesthesiologist was determined to be                            medically necessary for this procedure based on                            review of the patient's medical history,                            medications, and prior anesthesia history.                           After obtaining informed consent, the colonoscope                            was passed under direct vision. Throughout the                            procedure, the patient's blood pressure, pulse, and                            oxygen saturations were monitored continuously. The                            CF-[id number] ([id number]) scope was introduced through                            the anus and advanced to the the descending colon.                            The colonoscopy was extremely difficult  due to poor                            bowel prep with stool present. The patient                            tolerated the procedure well. The quality of the                            bowel preparation was poor. Scope In: 8:01:24 AM Scope Out: 8:13:51 AM Total Procedure Duration: 0 hours 12 minutes 27 seconds  Findings:      The perianal and digital rectal examinations were normal.      Could only go to 50cm from the anus. Limited exam due to poor bowel prep       and stool. Estimated blood loss: none. Impression:               - Preparation of the colon was poor.                           -  No specimens collected. Moderate Sedation:      Moderate (conscious) sedation was personally administered by an       anesthesia professional. The following parameters were monitored: oxygen       saturation, heart rate, blood pressure, and response to care. Recommendation:           - Patient has a contact number available for                            emergencies. The signs and symptoms of potential                            delayed complications were discussed with the                            patient. Return to normal activities tomorrow.                            Written discharge instructions were provided to the                            patient.                           - Written discharge instructions were provided to                            the patient.                           - The signs and symptoms of potential delayed                            complications were discussed with the patient.                           - Patient has a contact number available for                            emergencies.                           - Return to normal activities tomorrow.                           - Resume previous diet.                           - Continue present medications. Procedure Code(s):        --- Professional ---                           [phone], 53, Colonoscopy,  flexible; diagnostic,                            including collection of specimen(s) by brushing or  washing, when performed (separate procedure) Diagnosis Code(s):        --- Professional ---                           R19.5, Other fecal abnormalities                           Z85.048, Personal history of other malignant                            neoplasm of rectum, rectosigmoid junction, and anus CPT copyright 2019 [employer]. All rights reserved. The codes documented in this report are preliminary and upon coder review may  be revised to meet current compliance requirements. Aviva Signs, MD Aviva Signs, MD 03/04/2020 8:30:00 AM This report has been signed electronically. Number of Addenda: 0

## 2020-03-04 NOTE — Anesthesia Preprocedure Evaluation (Signed)
Anesthesia Evaluation  Patient identified by MRN, date of birth, ID band Patient awake    Reviewed: Allergy & Precautions, H&P , NPO status , Patient's Chart, lab work & pertinent test results, reviewed documented beta blocker date and time   Airway Mallampati: I  TM Distance: >3 FB Neck ROM: full    Dental no notable dental hx.    Pulmonary neg pulmonary ROS, former smoker,    Pulmonary exam normal breath sounds clear to auscultation       Cardiovascular Exercise Tolerance: Good hypertension, negative cardio ROS   Rhythm:regular Rate:Normal     Neuro/Psych PSYCHIATRIC DISORDERS Anxiety Depression  Neuromuscular disease    GI/Hepatic negative GI ROS, Neg liver ROS,   Endo/Other  Hypothyroidism   Renal/GU negative Renal ROS  negative genitourinary   Musculoskeletal   Abdominal   Peds  Hematology negative hematology ROS (+)   Anesthesia Other Findings   Reproductive/Obstetrics negative OB ROS                             Anesthesia Physical Anesthesia Plan  ASA: III  Anesthesia Plan: General   Post-op Pain Management:    Induction:   PONV Risk Score and Plan: Propofol infusion  Airway Management Planned:   Additional Equipment:   Intra-op Plan:   Post-operative Plan:   Informed Consent: I have reviewed the patients History and Physical, chart, labs and discussed the procedure including the risks, benefits and alternatives for the proposed anesthesia with the patient or authorized representative who has indicated his/her understanding and acceptance.     Dental Advisory Given  Plan Discussed with: CRNA  Anesthesia Plan Comments:         Anesthesia Quick Evaluation

## 2020-03-04 NOTE — Discharge Instructions (Signed)
Colonoscopy, Adult, Care After This sheet gives you information about how to care for yourself after your procedure. Your doctor may also give you more specific instructions. If you have problems or questions, call your doctor. What can I expect after the procedure? After the procedure, it is common to have:  A small amount of blood in your poop (stool) for 24 hours.  Some gas.  Mild cramping or bloating in your belly (abdomen). Follow these instructions at home: Eating and drinking   Drink enough fluid to keep your pee (urine) pale yellow.  Follow instructions from your doctor about what you cannot eat or drink.  Return to your normal diet as told by your doctor. Avoid heavy or fried foods that are hard to digest. Activity  Rest as told by your doctor.  Do not sit for a long time without moving. Get up to take short walks every 1-2 hours. This is important. Ask for help if you feel weak or unsteady.  Return to your normal activities as told by your doctor. Ask your doctor what activities are safe for you. To help cramping and bloating:   Try walking around.  Put heat on your belly as told by your doctor. Use the heat source that your doctor recommends, such as a moist heat pack or a heating pad. ? Put a towel between your skin and the heat source. ? Leave the heat on for 20-30 minutes. ? Remove the heat if your skin turns bright red. This is very important if you are unable to feel pain, heat, or cold. You may have a greater risk of getting burned. General instructions  For the first 24 hours after the procedure: ? Do not drive or use machinery. ? Do not sign important documents. ? Do not drink alcohol. ? Do your daily activities more slowly than normal. ? Eat foods that are soft and easy to digest.  Take over-the-counter or prescription medicines only as told by your doctor.  Keep all follow-up visits as told by your doctor. This is important. Contact a doctor  if:  You have blood in your poop 2-3 days after the procedure. Get help right away if:  You have more than a small amount of blood in your poop.  You see large clumps of tissue (blood clots) in your poop.  Your belly is swollen.  You feel like you may vomit (nauseous).  You vomit.  You have a fever.  You have belly pain that gets worse, and medicine does not help your pain. Summary  After the procedure, it is common to have a small amount of blood in your poop. You may also have mild cramping and bloating in your belly.  For the first 24 hours after the procedure, do not drive or use machinery, do not sign important documents, and do not drink alcohol.  Get help right away if you have a lot of blood in your poop, feel like you may vomit, have a fever, or have more belly pain. This information is not intended to replace advice given to you by your health care provider. Make sure you discuss any questions you have with your health care provider. Document Revised: 11/11/2018 Document Reviewed: 11/11/2018 Elsevier Patient Education  2020 [employer]  

## 2020-03-04 NOTE — Anesthesia Procedure Notes (Signed)
Date/Time: 03/04/2020 8:01 AM Performed by: Orlie Dakin, CRNA Pre-anesthesia Checklist: Patient identified, Emergency Drugs available, Suction available and Patient being monitored Patient Re-evaluated:Patient Re-evaluated prior to induction Oxygen Delivery Method: Nasal cannula Induction Type: IV induction Placement Confirmation: positive ETCO2

## 2020-03-04 NOTE — Anesthesia Postprocedure Evaluation (Signed)
Anesthesia Post Note  Patient: Amber Williams  Procedure(s) Performed: COLONOSCOPY WITH PROPOFOL (N/A )  Patient location during evaluation: PACU Anesthesia Type: General Level of consciousness: awake and alert and oriented Pain management: pain level controlled Vital Signs Assessment: post-procedure vital signs reviewed and stable Respiratory status: spontaneous breathing, respiratory function stable and nonlabored ventilation Cardiovascular status: blood pressure returned to baseline and stable Postop Assessment: no apparent nausea or vomiting Anesthetic complications: no   No complications documented.   Last Vitals:  Vitals:   03/04/20 0821 03/04/20 0830  BP: 109/65 114/68  Pulse: 82 83  Resp: 15 15  Temp:    SpO2: 100% 100%    Last Pain:  Vitals:   03/04/20 0830  TempSrc:   PainSc: 0-No pain                 Orlie Dakin

## 2020-03-04 NOTE — Interval H&P Note (Signed)
History and Physical Interval Note:  03/04/2020 7:12 AM  Amber Williams  has presented today for surgery, with the diagnosis of anal cancer, rectal bleeding.  The various methods of treatment have been discussed with the patient and family. After consideration of risks, benefits and other options for treatment, the patient has consented to  Procedure(s): COLONOSCOPY WITH PROPOFOL (N/A) as a surgical intervention.  The patient's history has been reviewed, patient examined, no change in status, stable for surgery.  I have reviewed the patient's chart and labs.  Questions were answered to the patient's satisfaction.     Aviva Signs

## 2020-03-04 NOTE — Transfer of Care (Signed)
Immediate Anesthesia Transfer of Care Note  Patient: Amber Williams  Procedure(s) Performed: COLONOSCOPY WITH PROPOFOL (N/A )  Patient Location: PACU  Anesthesia Type:General  Level of Consciousness: awake, alert  and oriented  Airway & Oxygen Therapy: Patient Spontanous Breathing  Post-op Assessment: Report given to RN and Post -op Vital signs reviewed and stable  Post vital signs: Reviewed and stable  Last Vitals:  Vitals Value Taken Time  BP 180/160 03/04/20 0819  Temp    Pulse 82 03/04/20 0821  Resp 15 03/04/20 0821  SpO2 100 % 03/04/20 0821  Vitals shown include unvalidated device data.  Last Pain:  Vitals:   03/04/20 0754  TempSrc:   PainSc: 0-No pain      Patients Stated Pain Goal: 8 (88/82/80 0349)  Complications: No complications documented.

## 2020-03-09 ENCOUNTER — Other Ambulatory Visit: Payer: Self-pay | Admitting: Family Medicine

## 2020-03-10 ENCOUNTER — Encounter (HOSPITAL_COMMUNITY): Payer: Self-pay | Admitting: General Surgery

## 2020-03-16 ENCOUNTER — Encounter: Payer: Self-pay | Admitting: Nurse Practitioner

## 2020-03-16 ENCOUNTER — Ambulatory Visit (INDEPENDENT_AMBULATORY_CARE_PROVIDER_SITE_OTHER): Payer: Medicare HMO | Admitting: Nurse Practitioner

## 2020-03-16 DIAGNOSIS — J0101 Acute recurrent maxillary sinusitis: Secondary | ICD-10-CM | POA: Diagnosis not present

## 2020-03-16 MED ORDER — AMOXICILLIN-POT CLAVULANATE 875-125 MG PO TABS: 1.0000 | ORAL_TABLET | Freq: Two times a day (BID) | ORAL | 0 refills | Status: DC

## 2020-03-16 NOTE — Progress Notes (Signed)
Virtual Visit via telephone Note Due to COVID-19 pandemic this visit was conducted virtually. This visit type was conducted due to national recommendations for restrictions regarding the COVID-19 Pandemic (e.g. social distancing, sheltering in place) in an effort to limit this patient's exposure and mitigate transmission in our community. All issues noted in this document were discussed and addressed.  A physical exam was not performed with this format.  I connected with Amber Williams on 03/16/20 at 9:25 by telephone and verified that I am speaking with the correct person using two identifiers. Amber Williams is currently located at home and no one is currently with her during visit. The provider, Mary-Margaret Hassell Done, FNP is located in their office at time of visit.  I discussed the limitations, risks, security and privacy concerns of performing an evaluation and management service by telephone and the availability of in person appointments. I also discussed with the patient that there may be a patient responsible charge related to this service. The patient expressed understanding and agreed to proceed.   History and Present Illness:   Chief Complaint: Sinusitis   HPI Patient calls in c/o cough and congestion that started over a week ago. She has been taking vicks gel caps with come relief. But her cough has become productive. Developed left sided facil pressure and teeth hurting when she eats.  Denies covid exposure and has had both vaccines.    Review of Systems  Constitutional: Positive for malaise/fatigue. Negative for chills and fever.  HENT: Positive for congestion and sinus pain. Negative for ear discharge, ear pain and sore throat.   Respiratory: Positive for cough (productive at times).   Musculoskeletal: Negative for myalgias.  Neurological: Positive for headaches (occasional).     Observations/Objective: Alert and oriented- answers all questions appropriately No  distress Voice hoarse Wet cough    Assessment and Plan: Amber Williams in today with chief complaint of Sinusitis   1. Acute recurrent maxillary sinusitis 1. Take meds as prescribed 2. Use a cool mist humidifier especially during the winter months and when heat has been humid. 3. Use saline nose sprays frequently 4. Saline irrigations of the nose can be very helpful if done frequently.  * 4X daily for 1 week*  * Use of a nettie pot can be helpful with this. Follow directions with this* 5. Drink plenty of fluids 6. Keep thermostat turn down low 7.For any cough or congestion- dayquil an dnyquil 8. For fever or aces or pains- take tylenol or ibuprofen appropriate for age and weight.  * for fevers greater than 101 orally you may alternate ibuprofen and tylenol every  3 hours.   Meds ordered this encounter  Medications  . amoxicillin-clavulanate (AUGMENTIN) 875-125 MG tablet    Sig: Take 1 tablet by mouth 2 (two) times daily.    Dispense:  14 tablet    Refill:  0    Order Specific Question:   Supervising Provider    Answer:   Caryl Pina A [[id number]]        I discussed the assessment and treatment plan with the patient. The patient was provided an opportunity to ask questions and all were answered. The patient agreed with the plan and demonstrated an understanding of the instructions.   The patient was advised to call back or seek an in-person evaluation if the symptoms worsen or if the condition fails to improve as anticipated.  The above assessment and management plan was discussed with the patient. The patient  verbalized understanding of and has agreed to the management plan. Patient is aware to call the clinic if symptoms persist or worsen. Patient is aware when to return to the clinic for a follow-up visit. Patient educated on when it is appropriate to go to the emergency department.   Time call ended:  9:43 I provided 13 minutes of non-face-to-face time during this  encounter.    Mary-Margaret Hassell Done, FNP

## 2020-03-18 ENCOUNTER — Telehealth: Payer: Medicare HMO

## 2020-04-01 ENCOUNTER — Ambulatory Visit: Payer: Medicare HMO | Admitting: General Surgery

## 2020-04-06 ENCOUNTER — Encounter: Payer: Self-pay | Admitting: Family Medicine

## 2020-04-06 ENCOUNTER — Other Ambulatory Visit: Payer: Self-pay

## 2020-04-06 ENCOUNTER — Ambulatory Visit (INDEPENDENT_AMBULATORY_CARE_PROVIDER_SITE_OTHER): Payer: Medicare HMO | Admitting: Family Medicine

## 2020-04-06 ENCOUNTER — Encounter: Payer: Self-pay | Admitting: General Surgery

## 2020-04-06 ENCOUNTER — Ambulatory Visit (INDEPENDENT_AMBULATORY_CARE_PROVIDER_SITE_OTHER): Payer: Medicare HMO | Admitting: General Surgery

## 2020-04-06 VITALS — BP 125/83 | HR 118 | Temp 97.7°F | Resp 16 | Ht 65.0 in | Wt 199.0 lb

## 2020-04-06 VITALS — BP 130/71 | HR 92 | Temp 97.4°F | Resp 20 | Ht 65.0 in | Wt 197.5 lb

## 2020-04-06 DIAGNOSIS — K625 Hemorrhage of anus and rectum: Secondary | ICD-10-CM | POA: Diagnosis not present

## 2020-04-06 DIAGNOSIS — Z09 Encounter for follow-up examination after completed treatment for conditions other than malignant neoplasm: Secondary | ICD-10-CM

## 2020-04-06 DIAGNOSIS — E039 Hypothyroidism, unspecified: Secondary | ICD-10-CM | POA: Diagnosis not present

## 2020-04-06 DIAGNOSIS — B372 Candidiasis of skin and nail: Secondary | ICD-10-CM

## 2020-04-06 MED ORDER — ESCITALOPRAM OXALATE 10 MG PO TABS
10.0000 mg | ORAL_TABLET | Freq: Every day | ORAL | 1 refills | Status: DC
Start: 2020-04-06 — End: 2020-09-16

## 2020-04-06 MED ORDER — CLOTRIMAZOLE-BETAMETHASONE 1-0.05 % EX CREA: 1.0000 "application " | TOPICAL_CREAM | Freq: Two times a day (BID) | CUTANEOUS | 1 refills | Status: DC

## 2020-04-06 NOTE — Progress Notes (Signed)
Subjective:  Patient ID: Amber Williams, female    DOB: [birth year]  Age: 44 y.o. MRN: [id number]  CC: Medical Management of Chronic Issues   HPI Amber Williams presents for breaking out at the buttocks onset 4 days ago. The areas are very painful. It is bilateral. It radiates into the groin crease as well.    follow-up on  thyroid. The patient has a history of hypothyroidism for many years. It has been stable recently. Pt. denies any change in  voice, loss of hair, heat or cold intolerance. Energy level has been adequate to good. Patient denies constipation and diarrhea. No myxedema. Medication is as noted below. Verified that pt is taking it daily on an empty stomach. Well tolerated.   Depression screen Amber Williams 2/9 04/06/2020 02/03/2020 01/02/2020  Decreased Interest 0 0 0  Down, Depressed, Hopeless 0 0 0  PHQ - 2 Score 0 0 0  Altered sleeping - - -  Tired, decreased energy - - -  Change in appetite - - -  Feeling bad or failure about yourself  - - -  Trouble concentrating - - -  Moving slowly or fidgety/restless - - -  Suicidal thoughts - - -  PHQ-9 Score - - -  Difficult doing work/chores - - -  Some recent data might be hidden    History Amber Williams has a past medical history of Anal cancer ([place]) (01/10/2019), Arthritis, Depression, DVT (deep venous thrombosis) ([place]) (2013), Essential hypertension, Family history of breast cancer, Gait abnormality (08/03/2016), History of blood transfusion (2013), Hyperlipidemia, Hypothyroidism, Hypothyroidism, Multiple sclerosis ([place]), Neurogenic bladder (08/03/2016), Port-A-Cath in place (01/23/2019), PTSD (post-traumatic stress disorder), PTSD (post-traumatic stress disorder), Right foot drop (08/03/2016), Seasonal allergies, and Ulcer.   She has a past surgical history that includes Colon surgery; Gastric bypass (2010); Interstim Implant placement (2014); Portacath placement; Hemorrhoid surgery (N/A, 12/25/2018); Flexible sigmoidoscopy (N/A, 06/25/2019); and  Colonoscopy with propofol (N/A, 03/04/2020).   Her family history includes Breast cancer (age of onset: 73) in her sister; Breast cancer (age of onset: 50) in her mother; Cancer in her paternal grandmother; Diabetes in her father, maternal grandmother, and paternal grandmother; Heart disease in her maternal grandfather, maternal grandmother, and paternal grandfather; Hypertension in her father and sister; Miscarriages / Stillbirths in her sister; Stroke in her paternal grandfather.She reports that she quit smoking about 11 years ago. Her smoking use included cigarettes. She started smoking about 28 years ago. She has a 22.50 pack-year smoking history. She has never used smokeless tobacco. She reports previous alcohol use. She reports that she does not use drugs.    ROS Review of Systems  Constitutional: Negative.   HENT: Negative.   Eyes: Negative for visual disturbance.  Respiratory: Negative for shortness of breath.   Cardiovascular: Negative for chest pain.  Gastrointestinal: Negative for abdominal pain.  Skin: Positive for rash.    Objective:  BP 130/71   Pulse 92   Temp (!) 97.4 F (36.3 C) (Temporal)   Resp 20   Ht 5\' 5"  (1.651 m)   Wt 197 lb 8 oz (89.6 kg)   SpO2 100%   BMI 32.87 kg/m   BP Readings from Last 3 Encounters:  04/06/20 125/83  04/06/20 130/71  03/04/20 114/68    Wt Readings from Last 3 Encounters:  04/06/20 199 lb (90.3 kg)  04/06/20 197 lb 8 oz (89.6 kg)  03/04/20 188 lb 7.9 oz (85.5 kg)     Physical Exam Constitutional:  General: She is not in acute distress.    Appearance: She is well-developed and well-nourished.  Cardiovascular:     Rate and Rhythm: Normal rate and regular rhythm.  Pulmonary:     Breath sounds: Normal breath sounds.  Skin:    General: Skin is warm and dry.     Findings: Erythema (with papules and some desquamation. ) present.  Neurological:     Mental Status: She is alert and oriented to person, place, and time.   Psychiatric:        Mood and Affect: Mood and affect normal.    Results for orders placed or performed in visit on 04/06/20  TSH + free T4  Result Value Ref Range   TSH 1.220 0.450 - 4.500 uIU/mL   Free T4 1.36 0.82 - 1.77 ng/dL      Assessment & Plan:   Amber Williams was seen today for medical management of chronic issues.  Diagnoses and all orders for this visit:  Hypothyroidism, unspecified type -     TSH + free T4  Yeast dermatitis  Other orders -     escitalopram (LEXAPRO) 10 MG tablet; Take 1 tablet (10 mg total) by mouth daily. -     clotrimazole-betamethasone (LOTRISONE) cream; Apply 1 application topically 2 (two) times daily. To affected areas until rash clears    I have discontinued Amber Williams's magnesium citrate and amoxicillin-clavulanate. I have also changed her escitalopram. Additionally, I am having her start on clotrimazole-betamethasone. Lastly, I am having her maintain her Vitamin D3, vitamin B-12, Allergy Relief, flurazepam, levothyroxine, furosemide, traZODone, aspirin, amitriptyline, Altavera, omeprazole, and Probiotic Product (PROBIOTIC-10 ULTIMATE PO).  Allergies as of 04/06/2020      Reactions   Other Hives   Plastic Tape   Tecfidera [dimethyl Fumarate] Itching      Medication List       Accurate as of April 06, 2020 11:59 PM. If you have any questions, ask your nurse or doctor.        STOP taking these medications   amoxicillin-clavulanate 875-125 MG tablet Commonly known as: AUGMENTIN Stopped by: [name], MD   magnesium citrate Soln Stopped by: [name], MD     TAKE these medications   Allergy Relief 180 MG tablet Generic drug: fexofenadine Take 180 mg by mouth daily.   Altavera 0.15-30 MG-MCG tablet Generic drug: levonorgestrel-ethinyl estradiol TAKE 1 TABLET BY MOUTH EVERY DAY   amitriptyline 50 MG tablet Commonly known as: ELAVIL Take 1 tablet (50 mg total) by mouth at bedtime.   aspirin 81 MG EC  tablet Take 81 mg by mouth daily.   clotrimazole-betamethasone cream Commonly known as: Lotrisone Apply 1 application topically 2 (two) times daily. To affected areas until rash clears Started by: [name], MD   escitalopram 10 MG tablet Commonly known as: LEXAPRO Take 1 tablet (10 mg total) by mouth daily.   flurazepam 30 MG capsule Commonly known as: DALMANE Take 1 capsule (30 mg total) by mouth at bedtime.   furosemide 20 MG tablet Commonly known as: LASIX TAKE 1 TABLET BY MOUTH EVERY DAY AS NEEDED What changed: reasons to take this   levothyroxine 112 MCG tablet Commonly known as: SYNTHROID TAKE 1 TABLET BY MOUTH DAILY BEFORE BREAKFAST What changed:   how much to take  how to take this  when to take this  additional instructions   omeprazole 20 MG capsule Commonly known as: PRILOSEC TAKE 1 CAPSULE DAILY   PROBIOTIC-10 ULTIMATE PO Take by mouth.  traZODone 150 MG tablet Commonly known as: DESYREL USE FROM 1/3 TO 1 TABLET ASNEEDED FOR SLEEP What changed:   how much to take  how to take this  when to take this  additional instructions   vitamin B-12 500 MCG tablet Commonly known as: CYANOCOBALAMIN Take 500 mcg by mouth daily.   Vitamin D3 125 MCG (5000 UT) Caps Take 5,000 Units by mouth every morning.        Follow-up: Return in about 3 months (around 07/05/2020).  [name], M.D.

## 2020-04-06 NOTE — Progress Notes (Signed)
Subjective:     Amber Williams  Patient presents for follow-up of rectal bleeding after undergoing attempted colonoscopy.  I could not complete the colonoscopy due to stool that was present.  Patient could not afford the regular bowel prep.  She states she is having ongoing constipation issues for which she is taking MiraLAX now twice a day.  She states her constipation has been present for some time since her diagnosis of multiple sclerosis.  She also has multiple comorbidities.  She has not been seen by gastroenterology. Objective:    BP 125/83   Pulse (!) 118   Temp 97.7 F (36.5 C) (Oral)   Resp 16   Ht 5\' 5"  (1.651 m)   Wt 199 lb (90.3 kg)   SpO2 98%   BMI 33.12 kg/m   General:  alert, cooperative and no distress       Assessment:    Ongoing intermittent rectal bleeding with constipation, history of anal carcinoma with completion of therapy    Plan:   I told her that I should soon be receiving samples of Suprep which she could use for bowel preparation for a repeat colonoscopy.  I will be out of the office for the next 4 to 6 weeks I told her should her rectal bleeding worsen, or with constipation becomes more of an issue, referral to a gastroenterologist would be appropriate.  She will call my office at the end of January to schedule colonoscopy.  If she needs a colonoscopy sooner by GI, I am fine with that.

## 2020-04-07 LAB — TSH+FREE T4
Free T4: 1.36 ng/dL (ref 0.82–1.77)
TSH: 1.22 u[IU]/mL (ref 0.450–4.500)

## 2020-04-11 ENCOUNTER — Other Ambulatory Visit: Payer: Self-pay | Admitting: Family Medicine

## 2020-04-13 ENCOUNTER — Encounter: Payer: Self-pay | Admitting: Family Medicine

## 2020-04-14 ENCOUNTER — Other Ambulatory Visit: Payer: Self-pay | Admitting: Family Medicine

## 2020-04-14 MED ORDER — DOXYCYCLINE HYCLATE 100 MG PO CAPS: 100.0000 mg | ORAL_CAPSULE | Freq: Two times a day (BID) | ORAL | 0 refills | Status: DC

## 2020-04-14 NOTE — Progress Notes (Signed)
Hello [name],  Your lab result is normal and/or stable.Some minor variations that are not significant are commonly marked abnormal, but do not represent any medical problem for you.  Best regards, [name], M.D.

## 2020-04-17 ENCOUNTER — Other Ambulatory Visit: Payer: Self-pay | Admitting: Family Medicine

## 2020-04-19 ENCOUNTER — Telehealth: Payer: Medicare HMO

## 2020-04-29 ENCOUNTER — Telehealth: Payer: Medicare HMO

## 2020-05-10 ENCOUNTER — Other Ambulatory Visit: Payer: Self-pay | Admitting: Family Medicine

## 2020-05-13 ENCOUNTER — Ambulatory Visit: Payer: Medicare HMO | Admitting: Family Medicine

## 2020-05-19 ENCOUNTER — Ambulatory Visit (INDEPENDENT_AMBULATORY_CARE_PROVIDER_SITE_OTHER): Payer: Medicare HMO | Admitting: Family Medicine

## 2020-05-19 ENCOUNTER — Telehealth: Payer: Self-pay

## 2020-05-19 ENCOUNTER — Other Ambulatory Visit: Payer: Self-pay

## 2020-05-19 ENCOUNTER — Encounter: Payer: Self-pay | Admitting: Family Medicine

## 2020-05-19 VITALS — BP 113/62 | HR 110 | Temp 97.9°F | Resp 20 | Ht 65.0 in | Wt 203.5 lb

## 2020-05-19 DIAGNOSIS — L98491 Non-pressure chronic ulcer of skin of other sites limited to breakdown of skin: Secondary | ICD-10-CM

## 2020-05-19 MED ORDER — VALACYCLOVIR HCL 1 G PO TABS: 1000.0000 mg | ORAL_TABLET | Freq: Three times a day (TID) | ORAL | 0 refills | Status: DC

## 2020-05-19 MED ORDER — MUPIROCIN 2 % EX OINT: TOPICAL_OINTMENT | CUTANEOUS | 1 refills | Status: AC

## 2020-05-19 MED ORDER — DOXYCYCLINE HYCLATE 100 MG PO CAPS: 100.0000 mg | ORAL_CAPSULE | Freq: Two times a day (BID) | ORAL | 0 refills | Status: DC

## 2020-05-19 NOTE — Progress Notes (Signed)
No chief complaint on file.   HPI  Patient presents today for recheck previously reviewed outbreak on her groin area.  They have not gotten better in fact they are more painful.  Patient continues to use the Lotrisone without relief.  The ones on the front have recurred off and on for several years.  PMH: Smoking status noted ROS: Per HPI  Objective: BP 113/62   Pulse (!) 110   Temp 97.9 F (36.6 C) (Temporal)   Resp 20   Ht 5\' 5"  (1.651 m)   Wt 203 lb 8 oz (92.3 kg)   SpO2 99%   BMI 33.86 kg/m  Gen: NAD, alert, cooperative with exam HEENT: NCAT  skin: At the left groin there are several superficial ulcers in the inguinal fold.  These have a small amount of slough.  Posteriorly near the buttocks fold bilaterally on the left there is a 1 x 3 cm grade 2 ulceration which is not erythematous.  There is no slough.  On the right there is a 7 mm x 3 cm ulceration grade 2 as well. Neuro: Alert and oriented, No gross deficits  Assessment and plan:  1. Skin ulcer, limited to breakdown of skin ([name])     Meds ordered this encounter  Medications  . valACYclovir (VALTREX) 1000 MG tablet    Sig: Take 1 tablet (1,000 mg total) by mouth 3 (three) times daily.    Dispense:  21 tablet    Refill:  0  . mupirocin ointment (BACTROBAN) 2 %    Sig: Apply and cover with bandage twice daily    Dispense:  30 g    Refill:  1  . doxycycline (VIBRAMYCIN) 100 MG capsule    Sig: Take 1 capsule (100 mg total) by mouth 2 (two) times daily.    Dispense:  28 capsule    Refill:  0    Patient should change the dressing on the posterior ulcers daily cleaning in a warm shower applied mupirocin and gauze.  The ones on the front are in the skin fold and she should apply a dressing as best as possible can but definitely apply the mupirocin twice a day.  The distribution of the overall picture is not dermatomal but the ones in the front do look dermatomal and history suggests that they may be herpetic therefore I  went ahead with the Valtrex in addition to the doxycycline as noted above.  Follow up 10 days  [name], MD

## 2020-05-19 NOTE — Telephone Encounter (Signed)
Patient has received call from insurance company that they will no longer pay for patient's Flurazepam.  Patient states she has been on this medication for years and would like for [name] to see if we can get a prior authorization for this medication.

## 2020-05-20 ENCOUNTER — Ambulatory Visit: Payer: Medicare HMO | Admitting: General Surgery

## 2020-05-20 ENCOUNTER — Encounter: Payer: Self-pay | Admitting: General Surgery

## 2020-05-20 VITALS — BP 135/80 | HR 123 | Temp 98.3°F | Resp 16 | Ht 65.0 in | Wt 207.0 lb

## 2020-05-20 DIAGNOSIS — Z09 Encounter for follow-up examination after completed treatment for conditions other than malignant neoplasm: Secondary | ICD-10-CM

## 2020-05-20 NOTE — Progress Notes (Signed)
Subjective:     Amber Williams  Patient presented for scheduling of colonoscopy. Objective:    BP 135/80   Pulse (!) 123   Temp 98.3 F (36.8 C) (Oral)   Resp 16   Ht 5\' 5"  (1.651 m)   Wt 207 lb (93.9 kg)   SpO2 97%   BMI 34.45 kg/m   General:  alert, cooperative and no distress       Assessment:    History of anal cancer, blood in stools    Plan:   Will try to find sample of Suprep as patient cannot afford a bowel prep.  Once we have obtained this, we will schedule patient for colonoscopy.

## 2020-05-20 NOTE — Telephone Encounter (Signed)
PA in process for- Flurazepam   Key: [id number] - PA Case ID: [id number] Need help? Call us at [phone] Status Sent to [facility] Flurazepam HCl 30MG  capsules Form [employer] Electronic PA Form [phone] NCPDP)

## 2020-05-20 NOTE — Telephone Encounter (Signed)
FLURAZEPAM HCL Capsule Type of coverage approved: Non-Formulary This approval authorizes your coverage from 05/01/2020 - 04/30/2021, unless we notify you  otherwise, and as long as the following conditions apply: . you remain enrolled in our Medicare Part D prescription drug plan, . your physician or other prescriber continues to prescribe the medication for you, and . the medication continues to be safe for treating your condition.  Pharmacy and patient aware

## 2020-05-26 ENCOUNTER — Encounter: Payer: Self-pay | Admitting: Family Medicine

## 2020-05-28 ENCOUNTER — Ambulatory Visit: Payer: Medicare HMO | Admitting: Licensed Clinical Social Worker

## 2020-05-28 DIAGNOSIS — E039 Hypothyroidism, unspecified: Secondary | ICD-10-CM

## 2020-05-28 DIAGNOSIS — F4310 Post-traumatic stress disorder, unspecified: Secondary | ICD-10-CM

## 2020-05-28 DIAGNOSIS — F331 Major depressive disorder, recurrent, moderate: Secondary | ICD-10-CM

## 2020-05-28 NOTE — Chronic Care Management (AMB) (Signed)
Chronic Care Management    Clinical Social Work Follow Up Note  05/28/2020 Name: Amber Williams MRN: [id number] DOB: 1975/08/11  Amber Williams is a 45 y.o. year old female who is a primary care patient of [name], [name], MD. The CCM team was consulted for assistance with [employer] .   Review of patient status, including review of consultants reports, other relevant assessments, and collaboration with appropriate care team members and the patient's provider was performed as part of comprehensive patient evaluation and provision of chronic care management services.    SDOH (Social Determinants of Health) assessments performed: No; risk for tobacco use; risk for depression; risk for stress; risk for financial strain; risk for physical inactivity  [facility] Office Visit from 05/20/2019 in [place]  PHQ-9 Total Score 1     GAD 7 : Generalized Anxiety Score 04/01/2019  Nervous, Anxious, on Edge 1  Control/stop worrying 0  Worry too much - different things 0  Trouble relaxing 1  Restless 0  Easily annoyed or irritable 1  Afraid - awful might happen 0  Total GAD 7 Score 3  Anxiety Difficulty Somewhat difficult    Outpatient Encounter Medications as of 05/28/2020  Medication Sig  . ALLERGY RELIEF 180 MG tablet Take 180 mg by mouth daily.  [name] amitriptyline (ELAVIL) 50 MG tablet Take 1 tablet (50 mg total) by mouth at bedtime.  [name] aspirin 81 MG EC tablet Take 81 mg by mouth daily.  . Cholecalciferol (VITAMIN D3) 5000 units CAPS Take 5,000 Units by mouth every morning.  . clotrimazole-betamethasone (LOTRISONE) cream Apply 1 application topically 2 (two) times daily. To affected areas until rash clears  . doxycycline (VIBRAMYCIN) 100 MG capsule Take 1 capsule (100 mg total) by mouth 2 (two) times daily.  [name] escitalopram (LEXAPRO) 10 MG tablet Take 1 tablet (10 mg total) by mouth daily.  . flurazepam (DALMANE) 30 MG capsule TAKE 1 CAPSULE (30 MG TOTAL)  BY MOUTH AT BEDTIME.  . furosemide (LASIX) 20 MG tablet TAKE 1 TABLET BY MOUTH EVERY DAY AS NEEDED (Patient taking differently: Take 20 mg by mouth daily as needed for fluid.)  . levonorgestrel-ethinyl estradiol (ALTAVERA) 0.15-30 MG-MCG tablet Take 1 tablet by mouth daily.  [name] levothyroxine (SYNTHROID) 112 MCG tablet TAKE 1 TABLET DAILY BEFORE BREAKFAST  . mupirocin ointment (BACTROBAN) 2 % Apply and cover with bandage twice daily  . omeprazole (PRILOSEC) 20 MG capsule TAKE 1 CAPSULE DAILY  . traZODone (DESYREL) 150 MG tablet USE FROM 1/3 TO 1 TABLET ASNEEDED FOR SLEEP (Patient taking differently: Take 50-150 mg by mouth at bedtime.)  . valACYclovir (VALTREX) 1000 MG tablet Take 1 tablet (1,000 mg total) by mouth 3 (three) times daily.  . vitamin B-12 (CYANOCOBALAMIN) 500 MCG tablet Take 500 mcg by mouth daily.   No facility-administered encounter medications on file as of 05/28/2020.    LCSW called home phone number of client several times today but LCSW was not able to speak via phone today with client. LCSW did leave phone message for client asking her to call LCSW at [phone].  LCSW also called Debbie Dimwiddie , mother of client today; however, LCSW was not able to speak with [name], mother of client, via phone today. LCSW left phone message for mother of client asking her to please call LCSW at [phone]  Follow Up Plan: LCSW to call client /mother of client, in next 4 weeks to talk with client or her mother about managing anxiety and  stress issues of client  [name].Glennis Borger MSW, LCSW Licensed Clinical Social Worker [facility] Care Management [phone]

## 2020-05-28 NOTE — Patient Instructions (Addendum)
Licensed Clinical Social Worker Visit Information  Materials Provided: No  05/28/2020  Name: Amber Williams          MRN: [id number]       DOB: 30-Sep-1975  Amber Williams is a 45 y.o. year old female who is a primary care patient of [name], [name], MD. The CCM team was consulted for assistance with Amber Williams .   Review of patient status, including review of consultants reports, other relevant assessments, and collaboration with appropriate care team members and the patient's provider was performed as part of comprehensive patient evaluation and provision of chronic care management services.    SDOH (Social Determinants of Health) assessments performed: No; risk for tobacco use; risk for depression; risk for stress; risk for financial strain; risk for physical inactivity  LCSW called home phone number of client several times today but LCSW was not able to speak via phone today with client. LCSW did leave phone message for client asking her to call LCSW at [phone].  LCSW also called Amber Williams , mother of client today; however, LCSW was not able to speak with Amber Williams, mother of client, via phone today. LCSW left phone message for mother of client asking her to please call LCSW at [phone]  Follow Up Plan: LCSW to call client /mother of client, in next 4 weeks to talk with client or her mother about managing anxiety and stress issues of client  LCSW was not able to speak via phone today with client or mother of client; thus, client or her mother were not able to verbalize understanding of instructions provided today and were not able to accept or decline a print copy of patient instruction materials.   Amber Williams.Amber Williams MSW, LCSW Licensed Clinical Social Worker [facility] [phone]

## 2020-06-01 ENCOUNTER — Ambulatory Visit (INDEPENDENT_AMBULATORY_CARE_PROVIDER_SITE_OTHER): Payer: Medicare HMO | Admitting: Licensed Clinical Social Worker

## 2020-06-01 ENCOUNTER — Other Ambulatory Visit (HOSPITAL_COMMUNITY)
Admission: RE | Admit: 2020-06-01 | Discharge: 2020-06-01 | Disposition: A | Discharge status: Home / Self Care | Payer: Medicare HMO | Source: Ambulatory Visit | Attending: Family Medicine | Admitting: Family Medicine

## 2020-06-01 ENCOUNTER — Other Ambulatory Visit: Payer: Self-pay

## 2020-06-01 ENCOUNTER — Ambulatory Visit (INDEPENDENT_AMBULATORY_CARE_PROVIDER_SITE_OTHER): Payer: Medicare HMO | Admitting: Family Medicine

## 2020-06-01 ENCOUNTER — Encounter: Payer: Self-pay | Admitting: Family Medicine

## 2020-06-01 VITALS — BP 119/83 | HR 102 | Temp 97.5°F | Ht 65.0 in | Wt 204.0 lb

## 2020-06-01 DIAGNOSIS — F4310 Post-traumatic stress disorder, unspecified: Secondary | ICD-10-CM

## 2020-06-01 DIAGNOSIS — L98493 Non-pressure chronic ulcer of skin of other sites with necrosis of muscle: Secondary | ICD-10-CM | POA: Diagnosis not present

## 2020-06-01 DIAGNOSIS — G35 Multiple sclerosis: Secondary | ICD-10-CM

## 2020-06-01 DIAGNOSIS — R69 Illness, unspecified: Secondary | ICD-10-CM | POA: Diagnosis not present

## 2020-06-01 DIAGNOSIS — Z01419 Encounter for gynecological examination (general) (routine) without abnormal findings: Secondary | ICD-10-CM

## 2020-06-01 DIAGNOSIS — F331 Major depressive disorder, recurrent, moderate: Secondary | ICD-10-CM | POA: Diagnosis not present

## 2020-06-01 DIAGNOSIS — E039 Hypothyroidism, unspecified: Secondary | ICD-10-CM

## 2020-06-01 MED ORDER — AMITRIPTYLINE HCL 50 MG PO TABS: 50.0000 mg | ORAL_TABLET | Freq: Every day | ORAL | 1 refills | Status: DC

## 2020-06-01 MED ORDER — FLURAZEPAM HCL 30 MG PO CAPS
30.0000 mg | ORAL_CAPSULE | Freq: Every day | ORAL | 2 refills | Status: DC
Start: 2020-06-01 — End: 2020-06-15

## 2020-06-01 MED ORDER — OMEPRAZOLE 20 MG PO CPDR
20.0000 mg | DELAYED_RELEASE_CAPSULE | Freq: Every day | ORAL | 3 refills | Status: DC
Start: 2020-06-01 — End: 2020-06-07

## 2020-06-01 MED ORDER — SILVER SULFADIAZINE 1 % EX CREA: 1.0000 "application " | TOPICAL_CREAM | Freq: Every day | CUTANEOUS | 0 refills | Status: DC

## 2020-06-01 NOTE — Chronic Care Management (AMB) (Signed)
Chronic Care Management    Clinical Social Work Follow Up Note  06/01/2020 Name: Amber Williams MRN: [id number] DOB: 1976/01/31  Amber Williams is a 45 y.o. year old female who is a primary care patient of [name], [name], MD. The CCM team was consulted for assistance with [employer] .   Review of patient status, including review of consultants reports, other relevant assessments, and collaboration with appropriate care team members and the patient's provider was performed as part of comprehensive patient evaluation and provision of chronic care management services.    SDOH (Social Determinants of Health) assessments performed: No; risk for depression; risk for tobacco use; risk for physical inactivity; risk for stress; risk for financial strain  [employer] Visit from 05/20/2019 in [place]  PHQ-9 Total Score 1      GAD 7 : Generalized Anxiety Score 04/01/2019  Nervous, Anxious, on Edge 1  Control/stop worrying 0  Worry too much - different things 0  Trouble relaxing 1  Restless 0  Easily annoyed or irritable 1  Afraid - awful might happen 0  Total GAD 7 Score 3  Anxiety Difficulty Somewhat difficult    Outpatient Encounter Medications as of 06/01/2020  Medication Sig  . ALLERGY RELIEF 180 MG tablet Take 180 mg by mouth daily.  [name] amitriptyline (ELAVIL) 50 MG tablet Take 1 tablet (50 mg total) by mouth at bedtime.  [name] aspirin 81 MG EC tablet Take 81 mg by mouth daily.  . Cholecalciferol (VITAMIN D3) 5000 units CAPS Take 5,000 Units by mouth every morning.  . clotrimazole-betamethasone (LOTRISONE) cream Apply 1 application topically 2 (two) times daily. To affected areas until rash clears  . doxycycline (VIBRAMYCIN) 100 MG capsule Take 1 capsule (100 mg total) by mouth 2 (two) times daily.  [name] escitalopram (LEXAPRO) 10 MG tablet Take 1 tablet (10 mg total) by mouth daily.  . flurazepam (DALMANE) 30 MG capsule Take 1 capsule (30 mg total)  by mouth at bedtime.  [name] levonorgestrel-ethinyl estradiol (ALTAVERA) 0.15-30 MG-MCG tablet Take 1 tablet by mouth daily.  [name] levothyroxine (SYNTHROID) 112 MCG tablet TAKE 1 TABLET DAILY BEFORE BREAKFAST  . mupirocin ointment (BACTROBAN) 2 % Apply and cover with bandage twice daily  . omeprazole (PRILOSEC) 20 MG capsule Take 1 capsule (20 mg total) by mouth daily.  . traZODone (DESYREL) 150 MG tablet USE FROM 1/3 TO 1 TABLET ASNEEDED FOR SLEEP (Patient taking differently: Take 50-150 mg by mouth at bedtime.)  . vitamin B-12 (CYANOCOBALAMIN) 500 MCG tablet Take 500 mcg by mouth daily.  . [DISCONTINUED] amitriptyline (ELAVIL) 50 MG tablet Take 1 tablet (50 mg total) by mouth at bedtime.  . [DISCONTINUED] flurazepam (DALMANE) 30 MG capsule TAKE 1 CAPSULE (30 MG TOTAL) BY MOUTH AT BEDTIME.  . [DISCONTINUED] omeprazole (PRILOSEC) 20 MG capsule TAKE 1 CAPSULE DAILY   No facility-administered encounter medications on file as of 06/01/2020.    Goals    .  Client will talk with LCSW in next 3 weeks to discuss stress/anxiety management for client (pt-stated)      Current Barriers:  . Client has MS (multiple sclerosis) and has Chronic Diagnoses also of Depression, PTSD, and Hypothyroidism . Client has decreased energy . Decreased appetite  Clinical Social Work Clinical Goal(s):  [name] LCSW will talk with client in next 3 weeks about stress/anxiety management for client  Interventions:  Talked with client about her appointment today with Dr. Livia Snellen Talked with client about pain issues of client Talked  with client about skin care issues of client Talked with client about procurement of prescribed medications Talked with client about medication costs for client Talked with client about sleeping issues of client Talked with client about appetite of client Talked with client about CCM program services Encouraged client to talk with RNCM to discuss nursing needs of client Talked with [facility] about her use of  food stamps benefit Talked with [facility] about health needs of her sister Talked with [facility] about health needs of her mother Talked with [facility] about her upcoming client medical appointments Talked with client about ADLs completion Talked with [facility] about her mobility (uses a walker to walk) Collaborated with RNCM about nursing needs of client  Patient Self Care Activities:   Attends classes, as able a, at [facility] medications as prescribed.   Plan:  Client to attend scheduled client medical appointments Client to communicate with RNCM to discuss nursing needs of client LCSW to call client in next 4 weeks to talk with her about managing anxiety and stress issues of client  Initial goal documentation     Follow Up Plan: LCSW to call client /mother of client, in next 4 weeks to talk with client or her mother about managing anxiety and stress issues of client  [name].Duilio Heritage MSW, LCSW Licensed Clinical Social Worker [facility] [phone]

## 2020-06-01 NOTE — Patient Instructions (Addendum)
Licensed Clinical Social Worker Visit Information  Goals we discussed today:   .  Client will talk with LCSW in next 3 weeks to discuss stress/anxiety management for client (pt-stated)        Current Barriers:   Client has MS (multiple sclerosis) and has Chronic Diagnoses also of Depression, PTSD, and Hypothyroidism  Client has decreased energy  Decreased appetite  Clinical Social Work Clinical Goal(s):   LCSW will talk with client in next 3 weeks about stress/anxiety management for client  Interventions:  Talked with client about her appointment today with Dr. Livia Snellen Talked with client about pain issues of client Talked with client about skin care issues of client Talked with client about procurement of prescribed medications Talked with client about medication costs for client Talked with client about sleeping issues of client Talked with client about appetite of client Talked with client about CCM program services Encouraged client to talk with RNCM to discuss nursing needs of client Talked with [facility] about her use of food stamps benefit Talked with [facility] about health needs of her sister Talked with Marvel about health needs of her mother Talked with Klyn about her upcoming client medical appointments Talked with client about ADLs completion Talked with [facility] about her mobility (uses a walker to walk) Collaborated with RNCM about nursing needs of client  Patient Self Care Activities:   Attends classes, as able a, at [place] medications as prescribed.   Plan:  Client to attend scheduled client medical appointments Client to communicate with RNCM to discuss nursing needs of client LCSW to call client in next 4 weeks to talk with her about managing anxiety and stress issues of client  Initial goal documentation     Follow Up Plan:LCSW to call client/mother of client,in next 4 weeks to talk with clientor her  motherabout managing anxiety and stress issues of client  Materials Provided: No  The patient verbalized understanding of instructions provided today and declined a print copy of patient instruction materials.   [name].Franky Reier MSW, LCSW Licensed Clinical Social Worker [facility] Care Management [phone]

## 2020-06-01 NOTE — Progress Notes (Signed)
Subjective:  Patient ID: Amber Williams, female    DOB: [birth year]  Age: 45 y.o. MRN: [id number]  CC: No chief complaint on file.   HPI Amber Williams presents for Pap smear examination.  She is not had any vaginal bleeding or discharge.  No pain.  However, she does have some source of around the groin region primarily on the upper thighs that have been quite painful.  They have been treated on 2 occasions before.  Those notes are reviewed.  She states that they have not healed.  They are to be reevaluated today as well.  Depression screen [facility] 2/9 06/01/2020 05/19/2020 04/06/2020  Decreased Interest 0 0 0  Down, Depressed, Hopeless 0 0 0  PHQ - 2 Score 0 0 0  Altered sleeping - - -  Tired, decreased energy - - -  Change in appetite - - -  Feeling bad or failure about yourself  - - -  Trouble concentrating - - -  Moving slowly or fidgety/restless - - -  Suicidal thoughts - - -  PHQ-9 Score - - -  Difficult doing work/chores - - -  Some recent data might be hidden    History Amber Williams has a past medical history of Anal cancer ([place]) (01/10/2019), Arthritis, Depression, DVT (deep venous thrombosis) ([place]) (2013), Essential hypertension, Family history of breast cancer, 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   She has a past surgical history that includes Colon surgery; Gastric bypass (2010); Interstim Implant placement (2014); Portacath placement; Hemorrhoid surgery (N/A, 12/25/2018); Flexible sigmoidoscopy (N/A, 06/25/2019); and Colonoscopy with propofol (N/A, 03/04/2020).   Her family history includes Breast cancer (age of onset: 26) in her sister; Breast cancer (age of onset: 21) in her mother;  Cancer in her paternal grandmother; Diabetes in her father, maternal grandmother, and paternal grandmother; Heart disease in her maternal grandfather, maternal grandmother, and paternal grandfather; Hypertension in her father and sister; Miscarriages / Stillbirths in her sister; Stroke in her paternal grandfather.She reports that she quit smoking about 11 years ago. Her smoking use included cigarettes. She started smoking about 29 years ago. She has a 22.50 pack-year smoking history. She has never used smokeless tobacco. She reports previous alcohol use. She reports that she does not use drugs.    ROS Review of Systems  Constitutional: Negative.   HENT: Negative.   Eyes: Negative for visual disturbance.  Respiratory: Negative for shortness of breath.   Cardiovascular: Negative for chest pain.  Gastrointestinal: Negative for abdominal pain.  Musculoskeletal: Negative for arthralgias.    Objective:  BP 119/83   Pulse (!) 102   Temp (!) 97.5 F (36.4 C) (Temporal)   Ht 5\' 5"  (1.651 m)   Wt 204 lb (92.5 kg)   BMI 33.95 kg/m   BP Readings from Last 3 Encounters:  06/01/20 119/83  05/20/20 135/80  05/19/20 113/62    Wt Readings from Last 3 Encounters:  06/01/20 204 lb (92.5 kg)  05/20/20 207 lb (93.9 kg)  05/19/20 203 lb 8 oz (92.3 kg)     Physical Exam Exam conducted with a chaperone present.  Genitourinary:    General: Normal vulva.     Exam position: Lithotomy position.     Cervix: Friability present. No lesion, erythema or cervical bleeding.    There are 3 ulcerations  visible.  The first is 1 x 3 cm located in the left inguinal fold.  There is muscle visibility.  No sign of inflammation.  The second is at the groin fold of the right thigh.  It is 1.4 cm x 4 cm with a clean base with muscle layer visible.  There is a 2 x 5 cm lesion at the left groin crease with muscle layer visible.  There is no inflammation apparent   Assessment & Plan:   Diagnoses and all orders for  this visit:  Encounter for well woman exam with routine gynecological exam -     Cytology - PAP([place])  Multiple sclerosis (HCC) -     amitriptyline (ELAVIL) 50 MG tablet; Take 1 tablet (50 mg total) by mouth at bedtime.  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 -     flurazepam (DALMANE) 30 MG capsule; Take 1 capsule (30 mg total) by mouth at bedtime. -     silver sulfADIAZINE (SILVADENE) 1 % cream; Apply 1 application topically daily.       I have discontinued Amber Williams's furosemide and valACYclovir. I have also changed her omeprazole. Additionally, I am having her start on silver sulfADIAZINE. Lastly, I am having her maintain her Vitamin D3, vitamin B-12, Allergy Relief, traZODone, aspirin, escitalopram, clotrimazole-betamethasone, levothyroxine, levonorgestrel-ethinyl estradiol, mupirocin ointment, doxycycline, flurazepam, and amitriptyline.  Allergies as of 06/01/2020      Reactions   Other Hives   Plastic Tape   Tecfidera [dimethyl Fumarate] Itching      Medication List       Accurate as of June 01, 2020  9:11 PM. If you have any questions, ask your nurse or doctor.        STOP taking these medications   furosemide 20 MG tablet Commonly known as: LASIX Stopped by: [name], MD   valACYclovir 1000 MG tablet Commonly known as: VALTREX Stopped by: [name], MD     TAKE these medications   Allergy Relief 180 MG tablet Generic drug: fexofenadine Take 180 mg by mouth daily.   amitriptyline 50 MG tablet Commonly known as: ELAVIL Take 1 tablet (50 mg total) by mouth at bedtime.   aspirin 81 MG EC tablet Take 81 mg by mouth daily.   clotrimazole-betamethasone cream Commonly known as: Lotrisone Apply 1 application topically 2 (two) times daily. To affected areas until rash clears    doxycycline 100 MG capsule Commonly known as: Vibramycin Take 1 capsule (100 mg total) by mouth 2 (two) times daily.   escitalopram 10 MG tablet Commonly known as: LEXAPRO Take 1 tablet (10 mg total) by mouth daily.   flurazepam 30 MG capsule Commonly known as: DALMANE Take 1 capsule (30 mg total) by mouth at bedtime.   levonorgestrel-ethinyl estradiol 0.15-30 MG-MCG tablet Commonly known as: Altavera Take 1 tablet by mouth daily.   levothyroxine 112 MCG tablet Commonly known as: SYNTHROID TAKE 1 TABLET DAILY BEFORE BREAKFAST   mupirocin ointment 2 % Commonly known as: BACTROBAN Apply and cover with bandage twice daily   omeprazole 20 MG capsule Commonly known as: PRILOSEC Take 1 capsule (20 mg total) by mouth daily.   silver sulfADIAZINE 1 % cream Commonly known as: Silvadene Apply 1 application topically daily. Started by: [name], MD  traZODone 150 MG tablet Commonly known as: DESYREL USE FROM 1/3 TO 1 TABLET ASNEEDED FOR SLEEP What changed:   how much to take  how to take this  when to take this  additional instructions   vitamin B-12 500 MCG tablet Commonly known as: CYANOCOBALAMIN Take 500 mcg by mouth daily.   Vitamin D3 125 MCG (5000 UT) Caps Take 5,000 Units by mouth every morning.        Follow-up: Return in about 3 months (around 08/29/2020), or if symptoms worsen or fail to improve.  [name], M.D.

## 2020-06-03 ENCOUNTER — Other Ambulatory Visit: Payer: Self-pay | Admitting: Family Medicine

## 2020-06-07 ENCOUNTER — Other Ambulatory Visit: Payer: Self-pay | Admitting: *Deleted

## 2020-06-07 LAB — CYTOLOGY - PAP: Diagnosis: REACTIVE — AB

## 2020-06-07 MED ORDER — OMEPRAZOLE 20 MG PO CPDR
20.0000 mg | DELAYED_RELEASE_CAPSULE | Freq: Every day | ORAL | 3 refills | Status: DC
Start: 2020-06-07 — End: 2021-04-18

## 2020-06-09 ENCOUNTER — Encounter: Payer: Self-pay | Admitting: Family Medicine

## 2020-06-15 ENCOUNTER — Other Ambulatory Visit: Payer: Self-pay

## 2020-06-15 ENCOUNTER — Other Ambulatory Visit: Payer: Self-pay | Admitting: Family Medicine

## 2020-06-15 ENCOUNTER — Telehealth: Payer: Self-pay

## 2020-06-15 ENCOUNTER — Encounter (HOSPITAL_BASED_OUTPATIENT_CLINIC_OR_DEPARTMENT_OTHER): Payer: Medicare HMO | Attending: Internal Medicine | Admitting: Internal Medicine

## 2020-06-15 ENCOUNTER — Encounter (HOSPITAL_BASED_OUTPATIENT_CLINIC_OR_DEPARTMENT_OTHER): Payer: Medicare HMO | Admitting: Internal Medicine

## 2020-06-15 DIAGNOSIS — L89312 Pressure ulcer of right buttock, stage 2: Secondary | ICD-10-CM | POA: Insufficient documentation

## 2020-06-15 DIAGNOSIS — X58XXXA Exposure to other specified factors, initial encounter: Secondary | ICD-10-CM | POA: Diagnosis not present

## 2020-06-15 DIAGNOSIS — Z86718 Personal history of other venous thrombosis and embolism: Secondary | ICD-10-CM | POA: Insufficient documentation

## 2020-06-15 DIAGNOSIS — Z923 Personal history of irradiation: Secondary | ICD-10-CM | POA: Insufficient documentation

## 2020-06-15 DIAGNOSIS — S31109S Unspecified open wound of abdominal wall, unspecified quadrant without penetration into peritoneal cavity, sequela: Secondary | ICD-10-CM | POA: Insufficient documentation

## 2020-06-15 DIAGNOSIS — Z85048 Personal history of other malignant neoplasm of rectum, rectosigmoid junction, and anus: Secondary | ICD-10-CM | POA: Insufficient documentation

## 2020-06-15 DIAGNOSIS — L98412 Non-pressure chronic ulcer of buttock with fat layer exposed: Secondary | ICD-10-CM | POA: Diagnosis not present

## 2020-06-15 DIAGNOSIS — G35 Multiple sclerosis: Secondary | ICD-10-CM | POA: Diagnosis not present

## 2020-06-15 DIAGNOSIS — L89322 Pressure ulcer of left buttock, stage 2: Secondary | ICD-10-CM | POA: Diagnosis not present

## 2020-06-15 MED ORDER — FLURAZEPAM HCL 30 MG PO CAPS
30.0000 mg | ORAL_CAPSULE | Freq: Every day | ORAL | 2 refills | Status: DC
Start: 2020-06-15 — End: 2020-09-14

## 2020-06-15 NOTE — Telephone Encounter (Signed)
Please let the patient know that I sent their prescription to their pharmacy. Thanks, WS 

## 2020-06-15 NOTE — Telephone Encounter (Signed)
Patient aware and verbalized understanding. °

## 2020-06-15 NOTE — Progress Notes (Signed)
Amber Williams, Amber Williams ([id number]) Visit Report for 06/15/2020 Abuse/Suicide Risk Screen Details Patient Name: Date of Service: Amber Williams, Amber Williams 06/15/2020 7:30 A M Medical Record Number: [id number] Patient Account Number: [id number] Date of Birth/Sex: Treating RN: 20-Jun-1975 (45 y.o. Female) Baruch Gouty Primary Care Raina Sole: [name] Other Clinician: Referring Luanna Weesner: Treating Azie Mcconahy/Extender: Neysa Hotter in Treatment: 0 Abuse/Suicide Risk Screen Items Answer ABUSE RISK SCREEN: Has anyone close to you tried to hurt or harm you recentlyo No Do you feel uncomfortable with anyone in your familyo No Has anyone forced you do things that you didnt want to doo No Electronic Signature(s) Signed: 06/15/2020 5:47:53 PM By: Baruch Gouty RN, BSN Entered By: Baruch Gouty on 06/15/2020 08:12:24 -------------------------------------------------------------------------------- Activities of Daily Living Details Patient Name: Date of Service: Amber Williams, [name] 06/15/2020 7:30 A M Medical Record Number: [id number] Patient Account Number: [id number] Date of Birth/Sex: Treating RN: [birth year] (45 y.o. Female) Baruch Gouty Primary Care Sharina Petre: [name] Other Clinician: Referring Hilmer Aliberti: Treating Princella Jaskiewicz/Extender: Neysa Hotter in Treatment: 0 Activities of Daily Living Items Answer Activities of Daily Living (Please select one for each item) Drive Automobile Completely Able T Medications ake Completely Able Use T elephone Completely Able Care for Appearance Completely Able Use T oilet Completely Able Bath / Shower Completely Able Dress Self Completely Able Feed Self Completely Able Walk Need Assistance Get In / Out Bed Completely Able Housework Completely Able Prepare Meals Completely [place] Completely Able Shop for Self Completely Able Electronic Signature(s) Signed: 06/15/2020 5:47:53 PM By:  Baruch Gouty RN, BSN Entered By: Baruch Gouty on 06/15/2020 08:13:00 -------------------------------------------------------------------------------- Education Screening Details Patient Name: Date of Service: Amber Williams, [name] 06/15/2020 7:30 A M Medical Record Number: [id number] Patient Account Number: [id number] Date of Birth/Sex: Treating RN: [birth year] (45 y.o. Female) Baruch Gouty Primary Care Loye Vento: [name] Other Clinician: Referring Bryston Colocho: Treating Regie Bunner/Extender: Neysa Hotter in Treatment: 0 Primary Learner Assessed: Patient Learning Preferences/Education Level/Primary Language Learning Preference: Explanation, Demonstration, Printed Material Highest Education Level: College or Above Preferred Language: English Cognitive Barrier Language Barrier: No Translator Needed: No Memory Deficit: No Emotional Barrier: No Cultural/Religious Beliefs Affecting Medical Care: No Physical Barrier Impaired Vision: Yes Glasses Impaired Hearing: No Decreased Hand dexterity: No Knowledge/Comprehension Knowledge Level: High Comprehension Level: High Ability to understand written instructions: High Ability to understand verbal instructions: High Motivation Anxiety Level: Calm Cooperation: Cooperative Education Importance: Acknowledges Need Interest in Health Problems: Asks Questions Perception: Coherent Willingness to Engage in Self-Management High Activities: Readiness to Engage in Self-Management High Activities: Electronic Signature(s) Signed: 06/15/2020 5:47:53 PM By: Baruch Gouty RN, BSN Entered By: Baruch Gouty on 06/15/2020 08:13:32 -------------------------------------------------------------------------------- Fall Risk Assessment Details Patient Name: Date of Service: Amber Williams, [name] 06/15/2020 7:30 A M Medical Record Number: [id number] Patient Account Number: [id number] Date of Birth/Sex: Treating  RN: 03/05/1976 (45 y.o. Female) Baruch Gouty Primary Care Madelena Maturin: [name] Other Clinician: Referring Jobany Montellano: Treating Siddh Vandeventer/Extender: Neysa Hotter in Treatment: 0 Fall Risk Assessment Items Have you had 2 or more falls in the last 12 monthso 0 Yes Have you had any fall that resulted in injury in the last 12 monthso 0 Yes FALLS RISK SCREEN History of falling - immediate or within 3 months 25 Yes Secondary diagnosis (Do you have 2 or more medical diagnoseso) 15 Yes Ambulatory aid None/bed rest/wheelchair/nurse 0 No Crutches/cane/walker 15 Yes Furniture 0 No Intravenous therapy Access/Saline/Heparin Lock 0 No Gait/Transferring Normal/ bed rest/ wheelchair  0 No Weak (short steps with or without shuffle, stooped but able to lift head while walking, may seek 10 Yes support from furniture) Impaired (short steps with shuffle, may have difficulty arising from chair, head down, impaired 0 No balance) Mental Status Oriented to own ability 0 Yes Electronic Signature(s) Signed: 06/15/2020 5:47:53 PM By: Baruch Gouty RN, BSN Entered By: Baruch Gouty on 06/15/2020 08:14:02 -------------------------------------------------------------------------------- Foot Assessment Details Patient Name: Date of Service: Amber Williams, [name] 06/15/2020 7:30 A M Medical Record Number: [id number] Patient Account Number: [id number] Date of Birth/Sex: Treating RN: [birth year] (45 y.o. Female) Baruch Gouty Primary Care Deklyn Gibbon: [name] Other Clinician: Referring Katisha Shimizu: Treating Tysheem Accardo/Extender: Neysa Hotter in Treatment: 0 Foot Assessment Items Site Locations + = Sensation present, - = Sensation absent, C = Callus, U = Ulcer R = Redness, W = Warmth, M = Maceration, PU = Pre-ulcerative lesion F = Fissure, S = Swelling, D = Dryness Assessment Right: Left: Other Deformity: No No Prior Foot Ulcer: No No Prior Amputation:  No No Charcot Joint: No No Ambulatory Status: Ambulatory With Help Assistance Device: Walker Gait: Administrator, arts) Signed: 06/15/2020 5:47:53 PM By: Baruch Gouty RN, BSN Entered By: Baruch Gouty on 06/15/2020 08:15:01 -------------------------------------------------------------------------------- Nutrition Risk Screening Details Patient Name: Date of Service: Amber Williams, [name] 06/15/2020 7:30 A M Medical Record Number: [id number] Patient Account Number: [id number] Date of Birth/Sex: Treating RN: [birth year] (46 y.o. Female) Baruch Gouty Primary Care Leslieann Whisman: [name] Other Clinician: Referring Lonnetta Kniskern: Treating Shephanie Romas/Extender: Neysa Hotter in Treatment: 0 Height (in): 65 Weight (lbs): 200 Body Mass Index (BMI): 33.3 Nutrition Risk Screening Items Score Screening NUTRITION RISK SCREEN: I have an illness or condition that made me change the kind and/or amount of food I eat 0 No I eat fewer than two meals per day 0 No I eat few fruits and vegetables, or milk products 0 No I have three or more drinks of beer, liquor or wine almost every day 0 No I have tooth or mouth problems that make it hard for me to eat 2 Yes I don't always have enough money to buy the food I need 0 No I eat alone most of the time 0 No I take three or more different prescribed or over-the-counter drugs a day 1 Yes Without wanting to, I have lost or gained 10 pounds in the last six months 2 Yes I am not always physically able to shop, cook and/or feed myself 0 No Nutrition Protocols Good Risk Protocol Moderate Risk Protocol 0 Provide education on nutrition High Risk Proctocol Risk Level: Moderate Risk Score: 5 Electronic Signature(s) Signed: 06/15/2020 5:47:53 PM By: Baruch Gouty RN, BSN Entered By: Baruch Gouty on 06/15/2020 08:14:47

## 2020-06-16 DIAGNOSIS — L8946 Pressure-induced deep tissue damage of contiguous site of back, buttock and hip: Secondary | ICD-10-CM | POA: Diagnosis not present

## 2020-06-18 NOTE — Progress Notes (Signed)
PRAPTI, GRUSSING ([id number]) Visit Report for 06/15/2020 Allergy List Details Patient Name: Date of Service: [name], MERRYL BUCKELS 06/15/2020 7:30 A M Medical Record Number: [id number] Patient Account Number: [id number] Date of Birth/Sex: Treating RN: [birth year] (45 y.o. Female) Baruch Gouty Primary Care Euell Schiff: [name] Other Clinician: Referring Roch Quach: Treating Naysa Puskas/Extender: Neysa Hotter in Treatment: 0 Allergies Active Allergies Tecfidera Reaction: hives Severity: Severe Type: Medication Allergy Notes Electronic Signature(s) Signed: 06/15/2020 5:47:53 PM By: Baruch Gouty RN, BSN Entered By: Baruch Gouty on 06/15/2020 07:57:05 -------------------------------------------------------------------------------- Arrival Information Details Patient Name: Date of Service: [name], Julionna D. 06/15/2020 7:30 A M Medical Record Number: [id number] Patient Account Number: [id number] Date of Birth/Sex: Treating RN: Oct 12, 1975 (45 y.o. Female) Baruch Gouty Primary Care Tod Abrahamsen: [name] Other Clinician: Referring Tay Whitwell: Treating Elfida Shimada/Extender: Neysa Hotter in Treatment: 0 Visit Information Patient Arrived: Charlyn Minerva Time: 07:47 Accompanied By: mother Transfer Assistance: None Patient Identification Verified: Yes Secondary Verification Process Completed: Yes Patient Requires Transmission-Based Precautions: No Patient Has Alerts: No Electronic Signature(s) Signed: 06/15/2020 5:47:53 PM By: Baruch Gouty RN, BSN Entered By: Baruch Gouty on 06/15/2020 07:51:52 -------------------------------------------------------------------------------- Clinic Level of Care Assessment Details Patient Name: Date of Service: [name], Enna D. 06/15/2020 7:30 A M Medical Record Number: [id number] Patient Account Number: [id number] Date of Birth/Sex: Treating RN: 07-Dec-1975 (45 y.o. Female) Rhae Hammock Primary Care Tara Wich: [name] Other Clinician: Referring Lakeyia Surber: Treating Cornel Werber/Extender: Neysa Hotter in Treatment: 0 Clinic Level of Care Assessment Items TOOL 2 Quantity Score X- 1 0 Use when only an EandM is performed on the INITIAL visit ASSESSMENTS - Nursing Assessment / Reassessment X- 1 20 General Physical Exam (combine w/ comprehensive assessment (listed just below) when performed on new pt. evals) X- 1 25 Comprehensive Assessment (HX, ROS, Risk Assessments, Wounds Hx, etc.) ASSESSMENTS - Wound and Skin A ssessment / Reassessment X - Simple Wound Assessment / Reassessment - one wound 1 5 []  - 0 Complex Wound Assessment / Reassessment - multiple wounds X- 1 10 Dermatologic / Skin Assessment (not related to wound area) ASSESSMENTS - Ostomy and/or Continence Assessment and Care []  - 0 Incontinence Assessment and Management []  - 0 Ostomy Care Assessment and Management (repouching, etc.) PROCESS - Coordination of Care X - Simple Patient / Family Education for ongoing care 1 15 []  - 0 Complex (extensive) Patient / Family Education for ongoing care X- 1 10 Staff obtains Programmer, systems, Records, T Results / Process Orders est []  - 0 Staff telephones HHA, Nursing Homes / Clarify orders / etc []  - 0 Routine Transfer to another Facility (non-emergent condition) []  - 0 Routine Hospital Admission (non-emergent condition) X- 1 15 New Admissions / Biomedical engineer / Ordering NPWT Apligraf, etc. , []  - 0 Emergency Hospital Admission (emergent condition) X- 1 10 Simple Discharge Coordination []  - 0 Complex (extensive) Discharge Coordination PROCESS - Special Needs []  - 0 Pediatric / Minor Patient Management []  - 0 Isolation Patient Management []  - 0 Hearing / Language / Visual special needs []  - 0 Assessment of Community assistance (transportation, D/C planning, etc.) []  - 0 Additional assistance / Altered mentation []  -  0 Support Surface(s) Assessment (bed, cushion, seat, etc.) INTERVENTIONS - Wound Cleansing / Measurement X- 1 5 Wound Imaging (photographs - any number of wounds) []  - 0 Wound Tracing (instead of photographs) []  - 0 Simple Wound Measurement - one wound X- 2 5 Complex Wound Measurement - multiple wounds []  - 0 Simple Wound  Cleansing - one wound X- 2 5 Complex Wound Cleansing - multiple wounds INTERVENTIONS - Wound Dressings []  - 0 Small Wound Dressing one or multiple wounds X- 2 15 Medium Wound Dressing one or multiple wounds []  - 0 Large Wound Dressing one or multiple wounds []  - 0 Application of Medications - injection INTERVENTIONS - Miscellaneous []  - 0 External ear exam []  - 0 Specimen Collection (cultures, biopsies, blood, body fluids, etc.) []  - 0 Specimen(s) / Culture(s) sent or taken to Lab for analysis []  - 0 Patient Transfer (multiple staff / Harrel Lemon Lift / Similar devices) []  - 0 Simple Staple / Suture removal (25 or less) []  - 0 Complex Staple / Suture removal (26 or more) []  - 0 Hypo / Hyperglycemic Management (close monitor of Blood Glucose) []  - 0 Ankle / Brachial Index (ABI) - do not check if billed separately Has the patient been seen at the hospital within the last three years: Yes Total Score: 165 Level Of Care: New/Established - Level 5 Electronic Signature(s) Signed: 06/18/2020 5:09:47 PM By: Rhae Hammock RN Entered By: Rhae Hammock on 06/15/2020 09:25:41 -------------------------------------------------------------------------------- Encounter Discharge Information Details Patient Name: Date of Service: [name], Laraya D. 06/15/2020 7:30 A M Medical Record Number: [id number] Patient Account Number: [id number] Date of Birth/Sex: Treating RN: October 17, 1975 (45 y.o. Female) Levan Hurst Primary Care Carline Dura: [name] Other Clinician: Referring Seymour Pavlak: Treating Hazael Olveda/Extender: Neysa Hotter in  Treatment: 0 Encounter Discharge Information Items Discharge Condition: Stable Ambulatory Status: Ambulatory Discharge Destination: Home Transportation: Private Auto Schedule Follow-up Appointment: Yes Clinical Summary of Care: Patient Declined Electronic Signature(s) Signed: 06/15/2020 5:45:50 PM By: Levan Hurst RN, BSN Entered By: Levan Hurst on 06/15/2020 17:11:10 -------------------------------------------------------------------------------- Lower Extremity Assessment Details Patient Name: Date of Service: [name], Ravina D. 06/15/2020 7:30 A M Medical Record Number: [id number] Patient Account Number: [id number] Date of Birth/Sex: Treating RN: 10-08-1975 (45 y.o. Female) Baruch Gouty Primary Care Dazha Kempa: [name] Other Clinician: Referring Zariah Cavendish: Treating Kristy Catoe/Extender: Neysa Hotter in Treatment: 0 Electronic Signature(s) Signed: 06/15/2020 5:47:53 PM By: Baruch Gouty RN, BSN Entered By: Baruch Gouty on 06/15/2020 08:15:07 -------------------------------------------------------------------------------- Multi Wound Chart Details Patient Name: Date of Service: [name], Safiyah D. 06/15/2020 7:30 A M Medical Record Number: [id number] Patient Account Number: [id number] Date of Birth/Sex: Treating RN: October 08, 1975 (45 y.o. Female) Rhae Hammock Primary Care Yong Wahlquist: [name] Other Clinician: Referring Cora Stetson: Treating Beverely Suen/Extender: Neysa Hotter in Treatment: 0 Vital Signs Height(in): 65 Pulse(bpm): 32 Weight(lbs): 200 Blood Pressure(mmHg): 133/83 Body Mass Index(BMI): 33 Temperature(F): 97.8 Respiratory Rate(breaths/min): 18 Photos: [1:No Photos Left Gluteal fold] [2:No Photos Left, Medial Gluteal fold] [N/A:N/A N/A] Wound Location: [1:Gradually Appeared] [2:Gradually Appeared] [N/A:N/A] Wounding Event: [1:Pressure Ulcer] [2:Pressure Ulcer] [N/A:N/A] Primary Etiology:  [1:Anemia, Received Chemotherapy,] [2:Anemia, Received Chemotherapy,] [N/A:N/A] Comorbid History: [1:Received Radiation 04/12/2020] [2:Received Radiation 04/12/2020] [N/A:N/A] Date Acquired: [1:0] [2:0] [N/A:N/A] Weeks of Treatment: [1:Open] [2:Open] [N/A:N/A] Wound Status: [1:0.2x0.8x0.1] [2:0.3x0.7x0.1] [N/A:N/A] Measurements L x W x D (cm) [1:0.126] [2:0.165] [N/A:N/A] A (cm) : rea [1:0.013] [2:0.016] [N/A:N/A] Volume (cm) : [1:Category/Stage II] [2:Category/Stage II] [N/A:N/A] Classification: [1:None Present] [2:None Present] [N/A:N/A] Exudate A mount: [1:Flat and Intact] [2:Flat and Intact] [N/A:N/A] Wound Margin: [1:Large (67-100%)] [2:Medium (34-66%)] [N/A:N/A] Granulation A mount: [1:Red] [2:Red] [N/A:N/A] Granulation Quality: [1:None Present (0%)] [2:Medium (34-66%)] [N/A:N/A] Necrotic A mount: [1:Fat Layer (Subcutaneous Tissue): Yes Fat Layer (Subcutaneous Tissue): Yes N/A] Exposed Structures: [1:Fascia: No Tendon: No Muscle: No Joint: No Bone: No Small (1-33%)] [2:Fascia: No Tendon: No Muscle:  No Joint: No Bone: No N/A] [N/A:N/A] Treatment Notes Electronic Signature(s) Signed: 06/15/2020 5:22:09 PM By: Linton Ham MD Signed: 06/18/2020 5:09:47 PM By: Rhae Hammock RN Entered By: Linton Ham on 06/15/2020 09:27:07 -------------------------------------------------------------------------------- Multi-Disciplinary Care Plan Details Patient Name: Date of Service: [name], Eron D. 06/15/2020 7:30 A M Medical Record Number: [id number] Patient Account Number: [id number] Date of Birth/Sex: Treating RN: 06/09/1975 (45 y.o. Female) Rhae Hammock Primary Care Mckensi Redinger: [name] Other Clinician: Referring Radwan Cowley: Treating Shafter Jupin/Extender: Neysa Hotter in Treatment: 0 Active Inactive Orientation to the Wound Care Program Nursing Diagnoses: Knowledge deficit related to the wound healing center program Goals: Patient/caregiver  will verbalize understanding of the [place] Date Initiated: 06/15/2020 Target Resolution Date: 07/10/2020 Goal Status: Active Interventions: Provide education on orientation to the wound center Notes: Wound/Skin Impairment Nursing Diagnoses: Impaired tissue integrity Knowledge deficit related to ulceration/compromised skin integrity Goals: Patient will have a decrease in wound volume by X% from date: (specify in notes) Date Initiated: 06/15/2020 Target Resolution Date: 07/09/2020 Goal Status: Active Patient/caregiver will verbalize understanding of skin care regimen Date Initiated: 06/15/2020 Target Resolution Date: 07/10/2020 Goal Status: Active Ulcer/skin breakdown will have a volume reduction of 30% by week 4 Date Initiated: 06/15/2020 Target Resolution Date: 07/10/2020 Goal Status: Active Interventions: Assess patient/caregiver ability to obtain necessary supplies Assess patient/caregiver ability to perform ulcer/skin care regimen upon admission and as needed Assess ulceration(s) every visit Notes: Electronic Signature(s) Signed: 06/18/2020 5:09:47 PM By: Rhae Hammock RN Entered By: Rhae Hammock on 06/15/2020 07:49:01 -------------------------------------------------------------------------------- Pain Assessment Details Patient Name: Date of Service: [name], Thomasena D. 06/15/2020 7:30 A M Medical Record Number: [id number] Patient Account Number: [id number] Date of Birth/Sex: Treating RN: Jan 24, 1976 (45 y.o. Female) Baruch Gouty Primary Care Marques Ericson: [name] Other Clinician: Referring Jannette Cotham: Treating Santina Trillo/Extender: Neysa Hotter in Treatment: 0 Active Problems Location of Pain Severity and Description of Pain Patient Has Paino Yes Site Locations Pain Location: Pain Location: Pain in Ulcers With Dressing Change: No Duration of the Pain. Constant / Intermittento Intermittent Rate the  pain. Current Pain Level: 2 Worst Pain Level: 7 Least Pain Level: 0 Character of Pain Describe the Pain: Aching, Tender Pain Management and Medication Current Pain Management: Medication: Yes Other: reposition Is the Current Pain Management Adequate: Adequate How does your wound impact your activities of daily livingo Sleep: No Bathing: No Appetite: No Relationship With Others: No Bladder Continence: No Emotions: No Bowel Continence: No Drive: No Toileting: No Hobbies: No Dressing: No Electronic Signature(s) Signed: 06/15/2020 5:47:53 PM By: Baruch Gouty RN, BSN Entered By: Baruch Gouty on 06/15/2020 08:34:35 -------------------------------------------------------------------------------- Patient/Caregiver Education Details Patient Name: Date of Service: [name], Rojelio Brenner D. 2/15/2022andnbsp7:30 A M Medical Record Number: [id number] Patient Account Number: [id number] Date of Birth/Gender: Treating RN: 1976/02/07 (45 y.o. Female) Rhae Hammock Primary Care Physician: [name] Other Clinician: Referring Physician: Treating Physician/Extender: Neysa Hotter in Treatment: 0 Education Assessment Education Provided To: Patient Education Topics Provided Welcome T [facility]: o Methods: Explain/Verbal Responses: State content correctly Electronic Signature(s) Signed: 06/18/2020 5:09:47 PM By: Rhae Hammock RN Entered By: Rhae Hammock on 06/15/2020 07:49:07 -------------------------------------------------------------------------------- Wound Assessment Details Patient Name: Date of Service: [name], Jadda D. 06/15/2020 7:30 A M Medical Record Number: [id number] Patient Account Number: [id number] Date of Birth/Sex: Treating RN: 02/15/1976 (45 y.o. Female) Baruch Gouty Primary Care Caldonia Leap: [name] Other Clinician: Referring Nana Hoselton: Treating Rodolph Hagemann/Extender: Neysa Hotter in Treatment: 0 Wound  Status Wound Number: 1 Primary Etiology: Pressure Ulcer Wound Location: Left Gluteal fold Wound Status: Open Wounding Event: Gradually Appeared Comorbid History: Anemia, Received Chemotherapy, Received Radiation Date Acquired: 04/12/2020 Weeks Of Treatment: 0 Clustered Wound: No Photos Photo Uploaded By: Mikeal Hawthorne on 06/16/2020 11:58:08 Wound Measurements Length: (cm) 0.2 Width: (cm) 0.8 Depth: (cm) 0.1 Area: (cm) 0.126 Volume: (cm) 0.013 % Reduction in Area: % Reduction in Volume: Epithelialization: Small (1-33%) Tunneling: No Undermining: No Wound Description Classification: Category/Stage II Wound Margin: Flat and Intact Exudate Amount: None Present Foul Odor After Cleansing: No Slough/Fibrino No Wound Bed Granulation Amount: Large (67-100%) Exposed Structure Granulation Quality: Red Fascia Exposed: No Necrotic Amount: None Present (0%) Fat Layer (Subcutaneous Tissue) Exposed: Yes Tendon Exposed: No Muscle Exposed: No Joint Exposed: No Bone Exposed: No Treatment Notes Wound #1 (Gluteal fold) Wound Laterality: Left Cleanser Soap and Water Discharge Instruction: May shower and wash wound with dial antibacterial soap and water prior to dressing change. Wound Cleanser Discharge Instruction: Cleanse the wound with wound cleanser prior to applying a clean dressing using gauze sponges, not tissue or cotton balls. Peri-Wound Care Topical Primary Dressing Maxorb Extra Calcium Alginate 2x2 in Discharge Instruction: Apply calcium alginate to wound bed as instructed Secondary Dressing ABD Pad, 5x9 Discharge Instruction: Apply over primary dressing as directed. Secured With 24M Medipore H Soft Cloth Surgical T 4 x 2 (in/yd) ape Discharge Instruction: Secure dressing with tape as directed. Compression Wrap Compression Stockings Add-Ons Electronic Signature(s) Signed: 06/15/2020 5:47:53 PM By: Baruch Gouty RN, BSN Entered  By: Baruch Gouty on 06/15/2020 08:30:26 -------------------------------------------------------------------------------- Wound Assessment Details Patient Name: Date of Service: [name], Derrisha D. 06/15/2020 7:30 A M Medical Record Number: [id number] Patient Account Number: [id number] Date of Birth/Sex: Treating RN: [birth year] (45 y.o. Female) Baruch Gouty Primary Care Chole Driver: [name] Other Clinician: Referring Khalidah Herbold: Treating Amman Bartel/Extender: Neysa Hotter in Treatment: 0 Wound Status Wound Number: 2 Primary Etiology: Pressure Ulcer Wound Location: Left, Medial Gluteal fold Wound Status: Open Wounding Event: Gradually Appeared Comorbid History: Anemia, Received Chemotherapy, Received Radiation Date Acquired: 04/12/2020 Weeks Of Treatment: 0 Clustered Wound: No Photos Photo Uploaded By: Mikeal Hawthorne on 06/16/2020 11:58:20 Wound Measurements Length: (cm) 0.3 Width: (cm) 0.7 Depth: (cm) 0.1 Area: (cm) 0.165 Volume: (cm) 0.016 % Reduction in Area: % Reduction in Volume: Tunneling: No Undermining: No Wound Description Classification: Category/Stage II Wound Margin: Flat and Intact Exudate Amount: None Present Foul Odor After Cleansing: No Slough/Fibrino Yes Wound Bed Granulation Amount: Medium (34-66%) Exposed Structure Granulation Quality: Red Fascia Exposed: No Necrotic Amount: Medium (34-66%) Fat Layer (Subcutaneous Tissue) Exposed: Yes Necrotic Quality: Adherent Slough Tendon Exposed: No Muscle Exposed: No Joint Exposed: No Bone Exposed: No Treatment Notes Wound #2 (Gluteal fold) Wound Laterality: Left, Medial Cleanser Soap and Water Discharge Instruction: May shower and wash wound with dial antibacterial soap and water prior to dressing change. Wound Cleanser Discharge Instruction: Cleanse the wound with wound cleanser prior to applying a clean dressing using gauze sponges, not tissue or cotton  balls. Peri-Wound Care Topical Primary Dressing Maxorb Extra Calcium Alginate 2x2 in Discharge Instruction: Apply calcium alginate to wound bed as instructed Secondary Dressing ABD Pad, 5x9 Discharge Instruction: Apply over primary dressing as directed. Secured With 24M Medipore H Soft Cloth Surgical T 4 x 2 (in/yd) ape Discharge Instruction: Secure dressing with tape as directed. Compression Wrap Compression Stockings Add-Ons Electronic Signature(s) Signed: 06/15/2020 5:47:53 PM By: Baruch Gouty RN, BSN Entered By: Baruch Gouty on 06/15/2020 08:31:31 -------------------------------------------------------------------------------- Vitals  Details Patient Name: Date of Service: [name], NEYLA GAUNTT 06/15/2020 7:30 A M Medical Record Number: [id number] Patient Account Number: [id number] Date of Birth/Sex: Treating RN: 13-Aug-1975 (45 y.o. Female) Baruch Gouty Primary Care Wilder Kurowski: [name] Other Clinician: Referring Lariah Fleer: Treating Richy Spradley/Extender: Neysa Hotter in Treatment: 0 Vital Signs Time Taken: 07:51 Temperature (F): 97.8 Height (in): 65 Pulse (bpm): 88 Source: Stated Respiratory Rate (breaths/min): 18 Weight (lbs): 200 Blood Pressure (mmHg): 133/83 Source: Stated Reference Range: 80 - 120 mg / dl Body Mass Index (BMI): 33.3 Electronic Signature(s) Signed: 06/15/2020 5:47:53 PM By: Baruch Gouty RN, BSN Entered By: Baruch Gouty on 06/15/2020 07:53:54

## 2020-06-18 NOTE — Progress Notes (Signed)
[name] [name] ([id number]) Visit Report for 06/15/2020 Chief Complaint Document Details Patient Name: Date of Service: [name] [name] 06/15/2020 7:30 A M Medical Record Number: [id number] Patient Account Number: [id number] Date of Birth/Sex: Treating RN: December 12, 1975 (45 y.o. Female) Rhae Hammock Primary Care Provider: [name] Other Clinician: Referring Provider: Treating Provider/Extender: Neysa Hotter in Treatment: 0 Information Obtained from: Patient Chief Complaint 06/15/2020; patient is here for review of wounds on the lower abdominal pannus and the gluteal folds of her buttocks Electronic Signature(s) Signed: 06/15/2020 5:22:09 PM By: Linton Ham MD Entered By: Linton Ham on 06/15/2020 09:27:40 -------------------------------------------------------------------------------- HPI Details Patient Name: Date of Service: [name] Luz ND, [name] 06/15/2020 7:30 A M Medical Record Number: [id number] Patient Account Number: [id number] Date of Birth/Sex: Treating RN: 03/06/1976 (45 y.o. Female) Rhae Hammock Primary Care Provider: [name] Other Clinician: Referring Provider: Treating Provider/Extender: Neysa Hotter in Treatment: 0 History of Present Illness HPI Description: ADMISSION 06/15/2020; This is an unfortunate 45 year old woman who has a history of MS. He also has a history of localized anal cancer which underwent a a complete resection as well as radiation and apparently chemotherapy according the patient at [facility]. She has had some rectal bleeding and she is followed by Dr. Arnoldo Morale in [place]. In any case she was seen by her primary doctor last month with wounds in her bilateral gluteal folds as well as a lower abdominal pannus. At that point there was sizable open areas. She was given doxycycline Valtrex mupirocin and then Silvadene. She arrives in clinic today with her inguinal folds clear.  She had wounds in the lower abdominal pannus all of this is healed. In the gluteal folds the area on the right is healed she has 2 small open areas on the left. Past medical history includes multiple sclerosis, hypothyroidism, anal cancer as described, history of a DVT in 2013 right foot drop Electronic Signature(s) Signed: 06/15/2020 5:22:09 PM By: Linton Ham MD Entered By: Linton Ham on 06/15/2020 09:30:36 -------------------------------------------------------------------------------- Physical Exam Details Patient Name: Date of Service: [name] Luz ND, [name] 06/15/2020 7:30 A M Medical Record Number: [id number] Patient Account Number: [id number] Date of Birth/Sex: Treating RN: Dec 04, 1975 (45 y.o. Female) Rhae Hammock Primary Care Provider: [name] Other Clinician: Referring Provider: Treating Provider/Extender: Neysa Hotter in Treatment: 0 Constitutional Sitting or standing Blood Pressure is within target range for patient.. Pulse regular and within target range for patient.[name] Respirations regular, non-labored and within target range.. Temperature is normal and within the target range for the patient.[name] Appears in no distress. Respiratory work of breathing is normal. Integumentary (Hair, Skin) Really no primary cutaneous issues are seen even in the folds including her abdominal pannus everything looks healthy here.. Notes Wound exam; on her lower abdominal pannus there were clearly wounds present linear areas all of this is fully epithelialized. There is no intertrigo in the skin in this area looks very healthy The right gluteal fold also has a healed ulcer. The left gluteal fold is still open. Notable for the fact that her incontinence briefs for rides right over these areas No evidence of infection in either 1 of these areas Electronic Signature(s) Signed: 06/15/2020 5:22:09 PM By: Linton Ham MD Entered By: Linton Ham on 06/15/2020  09:33:53 -------------------------------------------------------------------------------- Physician Orders Details Patient Name: Date of Service: [name] Luz ND, [name] 06/15/2020 7:30 A M Medical Record Number: [id number] Patient Account Number: [id number] Date of Birth/Sex: Treating RN: [birth year] (45 y.o. Female) Rhae Hammock Primary  Care Provider: [name] Other Clinician: Referring Provider: Treating Provider/Extender: Neysa Hotter in Treatment: 0 Verbal / Phone Orders: No Diagnosis Coding Follow-up Appointments Return Appointment in 1 week. Bathing/ Shower/ Hygiene May shower with protection but do not get wound dressing(s) wet. - You may shower and use soap and water on the days that you change dressings. No bathing or soaking. Off-Loading Turn and reposition every 2 hours Other: - Do not use donut cushion, use a gel cushion. Wound Treatment Wound #1 - Gluteal fold Wound Laterality: Left Cleanser: Soap and Water Every Other Day/15 Days Discharge Instructions: May shower and wash wound with dial antibacterial soap and water prior to dressing change. Cleanser: Wound Cleanser (DME) (Generic) Every Other Day/15 Days Discharge Instructions: Cleanse the wound with wound cleanser prior to applying a clean dressing using gauze sponges, not tissue or cotton balls. Prim Dressing: Maxorb Extra Calcium Alginate 2x2 in (DME) (Generic) Every Other Day/15 Days ary Discharge Instructions: Apply calcium alginate to wound bed as instructed Secondary Dressing: ABD Pad, 5x9 (DME) Every Other Day/15 Days Discharge Instructions: Apply over primary dressing as directed. Secured With: 32M Medipore H Soft Cloth Surgical T 4 x 2 (in/yd) (DME) (Generic) Every Other Day/15 Days ape Discharge Instructions: Secure dressing with tape as directed. Wound #2 - Gluteal fold Wound Laterality: Left, Medial Cleanser: Soap and Water Every Other Day/15 Days Discharge Instructions:  May shower and wash wound with dial antibacterial soap and water prior to dressing change. Cleanser: Wound Cleanser (DME) (Generic) Every Other Day/15 Days Discharge Instructions: Cleanse the wound with wound cleanser prior to applying a clean dressing using gauze sponges, not tissue or cotton balls. Prim Dressing: Maxorb Extra Calcium Alginate 2x2 in (DME) (Generic) Every Other Day/15 Days ary Discharge Instructions: Apply calcium alginate to wound bed as instructed Secondary Dressing: ABD Pad, 5x9 (DME) Every Other Day/15 Days Discharge Instructions: Apply over primary dressing as directed. Secured With: 32M Medipore H Soft Cloth Surgical T 4 x 2 (in/yd) (DME) (Generic) Every Other Day/15 Days ape Discharge Instructions: Secure dressing with tape as directed. Electronic Signature(s) Signed: 06/15/2020 5:22:09 PM By: Linton Ham MD Signed: 06/18/2020 5:09:47 PM By: Rhae Hammock RN Entered By: Rhae Hammock on 06/15/2020 09:21:04 -------------------------------------------------------------------------------- Problem List Details Patient Name: Date of Service: [name] Luz ND, [name] 06/15/2020 7:30 A M Medical Record Number: [id number] Patient Account Number: [id number] Date of Birth/Sex: Treating RN: 10-04-1975 (45 y.o. Female) Rhae Hammock Primary Care Provider: [name] Other Clinician: Referring Provider: Treating Provider/Extender: Neysa Hotter in Treatment: 0 Active Problems ICD-10 Encounter Code Description Active Date MDM Diagnosis [phone] Pressure ulcer of left buttock, stage 2 06/15/2020 No Yes S31.109S Unspecified open wound of abdominal wall, unspecified quadrant without 06/15/2020 No Yes penetration into peritoneal cavity, sequela G35 Multiple sclerosis 06/15/2020 No Yes Inactive Problems Resolved Problems Electronic Signature(s) Signed: 06/15/2020 5:22:09 PM By: Linton Ham MD Entered By: Linton Ham on 06/15/2020  09:26:16 -------------------------------------------------------------------------------- Progress Note Details Patient Name: Date of Service: [name] Luz ND, [name] 06/15/2020 7:30 A M Medical Record Number: [id number] Patient Account Number: [id number] Date of Birth/Sex: Treating RN: May 06, 1975 (45 y.o. Female) Rhae Hammock Primary Care Provider: [name] Other Clinician: Referring Provider: Treating Provider/Extender: Neysa Hotter in Treatment: 0 Subjective Chief Complaint Information obtained from Patient 06/15/2020; patient is here for review of wounds on the lower abdominal pannus and the gluteal folds of her buttocks History of Present Illness (HPI) ADMISSION 06/15/2020; This is an unfortunate 45 year old woman  who has a history of MS. He also has a history of localized anal cancer which underwent a a complete resection as well as radiation and apparently chemotherapy according the patient at [facility]. She has had some rectal bleeding and she is followed by Dr. Arnoldo Morale in [place]. In any case she was seen by her primary doctor last month with wounds in her bilateral gluteal folds as well as a lower abdominal pannus. At that point there was sizable open areas. She was given doxycycline Valtrex mupirocin and then Silvadene. She arrives in clinic today with her inguinal folds clear. She had wounds in the lower abdominal pannus all of this is healed. In the gluteal folds the area on the right is healed she has 2 small open areas on the left. Past medical history includes multiple sclerosis, hypothyroidism, anal cancer as described, history of a DVT in 2013 right foot drop Patient History Information obtained from Patient. Allergies Tecfidera (Severity: Severe, Reaction: hives) Family History Cancer - Mother,Siblings,Paternal Grandparents, Diabetes - Maternal Grandparents,Father,Paternal Grandparents, Heart Disease - Paternal Grandparents, Hypertension  - Father, Stroke - Paternal Grandparents, Thyroid Problems - Mother,Maternal Grandparents, No family history of Hereditary Spherocytosis, Kidney Disease, Lung Disease, Seizures, Tuberculosis. Social History Former smoker - quit 2006, Marital Status - Single, Alcohol Use - Never, Drug Use - No History, Caffeine Use - Moderate - tea. Medical History Hematologic/Lymphatic Patient has history of Anemia Endocrine Denies history of Type I Diabetes, Type II Diabetes Genitourinary Denies history of End Stage Renal Disease Integumentary (Skin) Denies history of History of Burn Neurologic Denies history of Dementia, Neuropathy, Quadriplegia, Paraplegia, Seizure Disorder Oncologic Patient has history of Received Chemotherapy, Received Radiation Psychiatric Denies history of Anorexia/bulimia, Confinement Anxiety Hospitalization/Surgery History - flex sigmoidoscopy. - hemorrhoidectomy. - interstin implant placement. - gastric bypass. - portacath placement. Medical A Surgical History Notes nd Constitutional Symptoms (General Health) morbid obesity Gastrointestinal hemorrhoids, anal cancer Endocrine hypothyroidism Genitourinary neurogenic bladder Immunological MS Neurologic MS, right foot drop Oncologic anal cancer Psychiatric PTSD, depression Review of Systems (ROS) Constitutional Symptoms (General Health) Complains or has symptoms of Fatigue. Eyes Complains or has symptoms of Glasses / Contacts. Ear/Nose/Mouth/Throat Denies complaints or symptoms of Chronic sinus problems or rhinitis. Respiratory Denies complaints or symptoms of Chronic or frequent coughs, Shortness of Breath. Cardiovascular Denies complaints or symptoms of Chest pain. Gastrointestinal Denies complaints or symptoms of Frequent diarrhea, Nausea, Vomiting. Endocrine Complains or has symptoms of Heat/cold intolerance - cold intolerance. Genitourinary Denies complaints or symptoms of Frequent  urination. Integumentary (Skin) Complains or has symptoms of Wounds - abdominal folds, bil glut. Musculoskeletal Complains or has symptoms of Muscle Weakness. Denies complaints or symptoms of Muscle Pain. Neurologic Complains or has symptoms of Numbness/parasthesias. Psychiatric Denies complaints or symptoms of Claustrophobia, Suicidal. Objective Constitutional Sitting or standing Blood Pressure is within target range for patient.. Pulse regular and within target range for patient.[name] Respirations regular, non-labored and within target range.. Temperature is normal and within the target range for the patient.[name] Appears in no distress. Vitals Time Taken: 7:51 AM, Height: 65 in, Source: Stated, Weight: 200 lbs, Source: Stated, BMI: 33.3, Temperature: 97.8 F, Pulse: 88 bpm, Respiratory Rate: 18 breaths/min, Blood Pressure: 133/83 mmHg. Respiratory work of breathing is normal. General Notes: Wound exam; on her lower abdominal pannus there were clearly wounds present linear areas all of this is fully epithelialized. There is no intertrigo in the skin in this area looks very healthy ooThe right gluteal fold also has a healed ulcer. The left gluteal fold  is still open. Notable for the fact that her incontinence briefs for rides right over these areas ooNo evidence of infection in either 1 of these areas Integumentary (Hair, Skin) Really no primary cutaneous issues are seen even in the folds including her abdominal pannus everything looks healthy here.. Wound #1 status is Open. Original cause of wound was Gradually Appeared. The wound is located on the Left Gluteal fold. The wound measures 0.2cm length x 0.8cm width x 0.1cm depth; 0.126cm^2 area and 0.013cm^3 volume. There is Fat Layer (Subcutaneous Tissue) exposed. There is no tunneling or undermining noted. There is a none present amount of drainage noted. The wound margin is flat and intact. There is large (67-100%) red granulation within the  wound bed. There is no necrotic tissue within the wound bed. Wound #2 status is Open. Original cause of wound was Gradually Appeared. The wound is located on the Left,Medial Gluteal fold. The wound measures 0.3cm length x 0.7cm width x 0.1cm depth; 0.165cm^2 area and 0.016cm^3 volume. There is Fat Layer (Subcutaneous Tissue) exposed. There is no tunneling or undermining noted. There is a none present amount of drainage noted. The wound margin is flat and intact. There is medium (34-66%) red granulation within the wound bed. There is a medium (34-66%) amount of necrotic tissue within the wound bed including Adherent Slough. Assessment Active Problems ICD-10 Pressure ulcer of left buttock, stage 2 Unspecified open wound of abdominal wall, unspecified quadrant without penetration into peritoneal cavity, sequela Multiple sclerosis Plan Follow-up Appointments: Return Appointment in 1 week. Bathing/ Shower/ Hygiene: May shower with protection but do not get wound dressing(s) wet. - You may shower and use soap and water on the days that you change dressings. No bathing or soaking. Off-Loading: Turn and reposition every 2 hours Other: - Do not use donut cushion, use a gel cushion. WOUND #1: - Gluteal fold Wound Laterality: Left Cleanser: Soap and Water Every Other Day/15 Days Discharge Instructions: May shower and wash wound with dial antibacterial soap and water prior to dressing change. Cleanser: Wound Cleanser (DME) (Generic) Every Other Day/15 Days Discharge Instructions: Cleanse the wound with wound cleanser prior to applying a clean dressing using gauze sponges, not tissue or cotton balls. Prim Dressing: Maxorb Extra Calcium Alginate 2x2 in (DME) (Generic) Every Other Day/15 Days ary Discharge Instructions: Apply calcium alginate to wound bed as instructed Secondary Dressing: ABD Pad, 5x9 (DME) Every Other Day/15 Days Discharge Instructions: Apply over primary dressing as  directed. Secured With: 37M Medipore H Soft Cloth Surgical T 4 x 2 (in/yd) (DME) (Generic) Every Other Day/15 Days ape Discharge Instructions: Secure dressing with tape as directed. WOUND #2: - Gluteal fold Wound Laterality: Left, Medial Cleanser: Soap and Water Every Other Day/15 Days Discharge Instructions: May shower and wash wound with dial antibacterial soap and water prior to dressing change. Cleanser: Wound Cleanser (DME) (Generic) Every Other Day/15 Days Discharge Instructions: Cleanse the wound with wound cleanser prior to applying a clean dressing using gauze sponges, not tissue or cotton balls. Prim Dressing: Maxorb Extra Calcium Alginate 2x2 in (DME) (Generic) Every Other Day/15 Days ary Discharge Instructions: Apply calcium alginate to wound bed as instructed Secondary Dressing: ABD Pad, 5x9 (DME) Every Other Day/15 Days Discharge Instructions: Apply over primary dressing as directed. Secured With: 37M Medipore H Soft Cloth Surgical T 4 x 2 (in/yd) (DME) (Generic) Every Other Day/15 Days ape Discharge Instructions: Secure dressing with tape as directed. 1. We will use calcium alginate to the areas that remain open  on the left gluteal fold. What is crucial here is to avoid friction and pressure especially her incontinence briefs. I am not really sure what alternatives there might be to these especially at night. There is nothing open on the right 2. All of the areas on the lower abdominal pannus fold are closed her skin here looks very healthy. I told him the key here is to keep this area clean and dry and to try to make sure that the plastic of her incontinence briefs does not sit on this area for prolonged periods. This may become more of a difficult thing in the heat of the summer months. 3. I did not look at her perianal area. She is supposed to follow-up with Dr. Arnoldo Morale and I asked her about this 4. She also sits on a doughnut. Donuts have really fallen out of favor of asked  her to use a cushion. She also sleeps in a recliner which could be problematic with buttock wounds in the future. The reason behind this is chronic back pain according to the patient I spent 40 minutes in review of this patient's past medical history which was extensive, face-to-face evaluation and preparation of this record Electronic Signature(s) Signed: 06/15/2020 5:22:09 PM By: Linton Ham MD Entered By: Linton Ham on 06/15/2020 09:36:25 -------------------------------------------------------------------------------- HxROS Details Patient Name: Date of Service: [name] Luz ND, [name] 06/15/2020 7:30 A M Medical Record Number: [id number] Patient Account Number: [id number] Date of Birth/Sex: Treating RN: 20-Apr-1976 (45 y.o. Female) Baruch Gouty Primary Care Provider: [name] Other Clinician: Referring Provider: Treating Provider/Extender: Neysa Hotter in Treatment: 0 Information Obtained From Patient Constitutional Symptoms (General Health) Complaints and Symptoms: Positive for: Fatigue Medical History: Past Medical History Notes: morbid obesity Eyes Complaints and Symptoms: Positive for: Glasses / Contacts Ear/Nose/Mouth/Throat Complaints and Symptoms: Negative for: Chronic sinus problems or rhinitis Respiratory Complaints and Symptoms: Negative for: Chronic or frequent coughs; Shortness of Breath Cardiovascular Complaints and Symptoms: Negative for: Chest pain Gastrointestinal Complaints and Symptoms: Negative for: Frequent diarrhea; Nausea; Vomiting Medical History: Past Medical History Notes: hemorrhoids, anal cancer Endocrine Complaints and Symptoms: Positive for: Heat/cold intolerance - cold intolerance Medical History: Negative for: Type I Diabetes; Type II Diabetes Past Medical History Notes: hypothyroidism Genitourinary Complaints and Symptoms: Negative for: Frequent urination Medical History: Negative for: End  Stage Renal Disease Past Medical History Notes: neurogenic bladder Integumentary (Skin) Complaints and Symptoms: Positive for: Wounds - abdominal folds, bil glut Medical History: Negative for: History of Burn Musculoskeletal Complaints and Symptoms: Positive for: Muscle Weakness Negative for: Muscle Pain Neurologic Complaints and Symptoms: Positive for: Numbness/parasthesias Medical History: Negative for: Dementia; Neuropathy; Quadriplegia; Paraplegia; Seizure Disorder Past Medical History Notes: MS, right foot drop Psychiatric Complaints and Symptoms: Negative for: Claustrophobia; Suicidal Medical History: Negative for: Anorexia/bulimia; Confinement Anxiety Past Medical History Notes: PTSD, depression Hematologic/Lymphatic Medical History: Positive for: Anemia Immunological Medical History: Past Medical History Notes: MS Oncologic Medical History: Positive for: Received Chemotherapy; Received Radiation Past Medical History Notes: anal cancer Immunizations Pneumococcal Vaccine: Received Pneumococcal Vaccination: Yes Implantable Devices Yes Hospitalization / Surgery History Type of Hospitalization/Surgery flex sigmoidoscopy hemorrhoidectomy interstin implant placement gastric bypass portacath placement Family and Social History Cancer: Yes - Mother,Siblings,Paternal Grandparents; Diabetes: Yes - Maternal Grandparents,Father,Paternal Grandparents; Heart Disease: Yes - Paternal Grandparents; Hereditary Spherocytosis: No; Hypertension: Yes - Father; Kidney Disease: No; Lung Disease: No; Seizures: No; Stroke: Yes - Paternal Grandparents; Thyroid Problems: Yes - Mother,Maternal Grandparents; Tuberculosis: No; Former smoker - quit 2006; Marital Status - Single;  Alcohol Use: Never; Drug Use: No History; Caffeine Use: Moderate - tea; Financial Concerns: No; Food, Clothing or Shelter Needs: No; Support System Lacking: No; 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 06/15/2020 Medical Record Number: [id number] Patient Account Number: [id number] Date of Birth/Sex: Treating RN: 06-17-1975 (45 y.o. Female) Rhae Hammock Primary Care Provider: [name] Other Clinician: Referring Provider: Treating Provider/Extender: Neysa Hotter in Treatment: 0 Diagnosis Coding ICD-10 Codes Code Description [phone] Pressure ulcer of left buttock, stage 2 S31.109S Unspecified open wound of abdominal wall, unspecified quadrant without penetration into peritoneal cavity, sequela G35 Multiple sclerosis Facility Procedures CPT4 Code: [id number] Description: 99214 - WOUND CARE VISIT-LEV 4 EST PT Modifier: Quantity: 1 Physician Procedures : CPT4 Code Description Modifier [id number] WC PHYS LEVEL 3 NEW PT ICD-10 Diagnosis Description L89.322 Pressure ulcer of left buttock, stage 2 S31.109S Unspecified open wound of abdominal wall, unspecified quadrant without penetration into peritoneal cav  sequela G35 Multiple sclerosis Quantity: 1 ity, Electronic Signature(s) Signed: 06/15/2020 5:22:09 PM By: Linton Ham MD Entered By: Linton Ham on 06/15/2020 09:36:56

## 2020-06-22 ENCOUNTER — Other Ambulatory Visit: Payer: Self-pay

## 2020-06-22 ENCOUNTER — Encounter (HOSPITAL_BASED_OUTPATIENT_CLINIC_OR_DEPARTMENT_OTHER): Payer: Medicare HMO | Admitting: Internal Medicine

## 2020-06-22 DIAGNOSIS — S31109S Unspecified open wound of abdominal wall, unspecified quadrant without penetration into peritoneal cavity, sequela: Secondary | ICD-10-CM | POA: Diagnosis not present

## 2020-06-22 DIAGNOSIS — Z923 Personal history of irradiation: Secondary | ICD-10-CM | POA: Diagnosis not present

## 2020-06-22 DIAGNOSIS — G35 Multiple sclerosis: Secondary | ICD-10-CM | POA: Diagnosis not present

## 2020-06-22 DIAGNOSIS — Z86718 Personal history of other venous thrombosis and embolism: Secondary | ICD-10-CM | POA: Diagnosis not present

## 2020-06-22 DIAGNOSIS — X58XXXA Exposure to other specified factors, initial encounter: Secondary | ICD-10-CM | POA: Diagnosis not present

## 2020-06-22 DIAGNOSIS — Z85048 Personal history of other malignant neoplasm of rectum, rectosigmoid junction, and anus: Secondary | ICD-10-CM | POA: Diagnosis not present

## 2020-06-22 DIAGNOSIS — L89312 Pressure ulcer of right buttock, stage 2: Secondary | ICD-10-CM | POA: Diagnosis not present

## 2020-06-22 DIAGNOSIS — L98412 Non-pressure chronic ulcer of buttock with fat layer exposed: Secondary | ICD-10-CM | POA: Diagnosis not present

## 2020-06-22 DIAGNOSIS — L89322 Pressure ulcer of left buttock, stage 2: Secondary | ICD-10-CM | POA: Diagnosis not present

## 2020-06-22 NOTE — Progress Notes (Signed)
Amber Williams ([id number]) Visit Report for 06/22/2020 HPI Details Patient Name: Date of Service: Amber Williams, [place] 06/22/2020 11:00 A M Medical Record Number: [id number] Patient Account Number: [id number] Date of Birth/Sex: Treating RN: 1975-05-03 (45 y.o. Benjaman Lobe Primary Care Provider: [name] Other Clinician: Referring Provider: Treating Provider/Extender: Neysa Hotter in Treatment: 1 History of Present Illness HPI Description: ADMISSION 06/15/2020; This is an unfortunate 45 year old woman who has a history of MS. He also has a history of localized anal cancer which underwent a a complete resection as well as radiation and apparently chemotherapy according the patient at [facility]. She has had some rectal bleeding and she is followed by Dr. Arnoldo Morale in [place]. In any case she was seen by her primary doctor last month with wounds in her bilateral gluteal folds as well as a lower abdominal pannus. At that point there was sizable open areas. She was given doxycycline Valtrex mupirocin and then Silvadene. She arrives in clinic today with her inguinal folds clear. She had wounds in the lower abdominal pannus all of this is healed. In the gluteal folds the area on the right is healed she has 2 small open areas on the left. Past medical history includes multiple sclerosis, hypothyroidism, anal cancer as described, history of a DVT in 2013 right foot drop 2/22; the areas we are concerned about in the left buttock near her gluteal fold is clear. She has a new area on the right side but it is very small and superficial Electronic Signature(s) Signed: 06/22/2020 5:22:30 PM By: Linton Ham MD Entered By: Linton Ham on 06/22/2020 12:52:09 -------------------------------------------------------------------------------- Physical Exam Details Patient Name: Date of Service: Amber Williams, Aaliyan D. 06/22/2020 11:00 A M Medical Record Number:  [id number] Patient Account Number: [id number] Date of Birth/Sex: Treating RN: March 08, 1976 (45 y.o. Amber Williams, Amber Williams Primary Care Provider: [name] Other Clinician: Referring Provider: Treating Provider/Extender: Neysa Hotter in Treatment: 1 Constitutional Sitting or standing Blood Pressure is within target range for patient.. Pulse regular and within target range for patient.[name] Respirations regular, non-labored and within target range.. Temperature is normal and within the target range for the patient.. Notes Wound exam; the right gluteal fold had reopened today very small area of the left gluteal fold is closed Electronic Signature(s) Signed: 06/22/2020 5:22:30 PM By: Linton Ham MD Entered By: Linton Ham on 06/22/2020 12:50:00 -------------------------------------------------------------------------------- Physician Orders Details Patient Name: Date of Service: Amber Williams, Maeci D. 06/22/2020 11:00 A M Medical Record Number: [id number] Patient Account Number: [id number] Date of Birth/Sex: Treating RN: Dec 26, 1975 (45 y.o. Amber Williams, Amber Williams Primary Care Provider: [name] Other Clinician: Referring Provider: Treating Provider/Extender: Neysa Hotter in Treatment: 1 Verbal / Phone Orders: No Diagnosis Coding Follow-up Appointments Return appointment in 3 weeks. Bathing/ Shower/ Hygiene May shower with protection but do not get wound dressing(s) wet. - You may shower and use soap and water on the days that you change dressings. No bathing or soaking. Off-Loading Turn and reposition every 2 hours Other: - Do not use donut cushion, use a gel cushion. Wound Treatment Wound #3 - Gluteal fold Wound Laterality: Right Cleanser: Wound Cleanser Every Other Day Discharge Instructions: Cleanse the wound with wound cleanser prior to applying a clean dressing using gauze sponges, not tissue or cotton balls. Prim  Dressing: Maxorb Extra Calcium Alginate 2x2 in Every Other Day ary Discharge Instructions: Apply calcium alginate to wound bed as instructed Secondary Dressing: ABD Pad, 5x9 Every Other Day  Discharge Instructions: Apply over primary dressing as directed. Electronic Signature(s) Signed: 06/22/2020 5:22:30 PM By: Linton Ham MD Signed: 06/22/2020 5:43:17 PM By: Rhae Hammock RN Entered By: Rhae Hammock on 06/22/2020 12:06:23 -------------------------------------------------------------------------------- Problem List Details Patient Name: Date of Service: Amber Williams, Jolea D. 06/22/2020 11:00 A M Medical Record Number: [id number] Patient Account Number: [id number] Date of Birth/Sex: Treating RN: 1975/10/07 (45 y.o. Amber Williams, Amber Williams Primary Care Provider: [name] Other Clinician: Referring Provider: Treating Provider/Extender: Neysa Hotter in Treatment: 1 Active Problems ICD-10 Encounter Code Description Active Date MDM Diagnosis [phone] Pressure ulcer of left buttock, stage 2 06/15/2020 No Yes S31.109S Unspecified open wound of abdominal wall, unspecified quadrant without 06/15/2020 No Yes penetration into peritoneal cavity, sequela G35 Multiple sclerosis 06/15/2020 No Yes L89.312 Pressure ulcer of right buttock, stage 2 06/22/2020 No Yes Inactive Problems Resolved Problems Electronic Signature(s) Signed: 06/22/2020 5:22:30 PM By: Linton Ham MD Entered By: Linton Ham on 06/22/2020 12:45:26 -------------------------------------------------------------------------------- Progress Note Details Patient Name: Date of Service: Amber Williams, [name] 06/22/2020 11:00 A M Medical Record Number: [id number] Patient Account Number: [id number] Date of Birth/Sex: Treating RN: December 04, 1975 (45 y.o. Benjaman Lobe Primary Care Provider: [name] Other Clinician: Referring Provider: Treating Provider/Extender: Neysa Hotter in Treatment: 1 Subjective History of Present Illness (HPI) ADMISSION 06/15/2020; This is an unfortunate 45 year old woman who has a history of MS. He also has a history of localized anal cancer which underwent a a complete resection as well as radiation and apparently chemotherapy according the patient at [facility]. She has had some rectal bleeding and she is followed by Dr. Arnoldo Morale in [place]. In any case she was seen by her primary doctor last month with wounds in her bilateral gluteal folds as well as a lower abdominal pannus. At that point there was sizable open areas. She was given doxycycline Valtrex mupirocin and then Silvadene. She arrives in clinic today with her inguinal folds clear. She had wounds in the lower abdominal pannus all of this is healed. In the gluteal folds the area on the right is healed she has 2 small open areas on the left. Past medical history includes multiple sclerosis, hypothyroidism, anal cancer as described, history of a DVT in 2013 right foot drop 2/22; the areas we are concerned about in the left buttock near her inguinal fold is clear. She has a new area on the right side but it is very small and superficial Objective Constitutional Sitting or standing Blood Pressure is within target range for patient.. Pulse regular and within target range for patient.[name] Respirations regular, non-labored and within target range.. Temperature is normal and within the target range for the patient.. Vitals Time Taken: 11:19 AM, Height: 65 in, Source: Stated, Weight: 200 lbs, Source: Stated, BMI: 33.3, Temperature: 97.8 F, Pulse: 91 bpm, Respiratory Rate: 18 breaths/min, Blood Pressure: 141/81 mmHg. General Notes: Wound exam; the right gluteal fold had reopened today very small area of the left gluteal fold is closed Integumentary (Hair, Skin) Wound #1 status is Healed - Epithelialized. Original cause of wound was Gradually Appeared. The date acquired was:  04/12/2020. The wound has been in treatment 1 weeks. The wound is located on the Left Gluteal fold. The wound measures 0cm length x 0cm width x 0cm depth; 0cm^2 area and 0cm^3 volume. There is no tunneling or undermining noted. There is a none present amount of drainage noted. The wound margin is flat and intact. There is no granulation within the wound bed.  There is no necrotic tissue within the wound bed. Wound #2 status is Healed - Epithelialized. Original cause of wound was Gradually Appeared. The date acquired was: 04/12/2020. The wound has been in treatment 1 weeks. The wound is located on the Left,Medial Gluteal fold. The wound measures 0cm length x 0cm width x 0cm depth; 0cm^2 area and 0cm^3 volume. There is no tunneling or undermining noted. There is a none present amount of drainage noted. The wound margin is flat and intact. There is no granulation within the wound bed. There is no necrotic tissue within the wound bed. Wound #3 status is Open. Original cause of wound was Pressure Injury. The date acquired was: 06/21/2020. The wound is located on the Right Gluteal fold. The wound measures 0.7cm length x 0.2cm width x 0.1cm depth; 0.11cm^2 area and 0.011cm^3 volume. The wound is limited to skin breakdown. There is no tunneling or undermining noted. There is a none present amount of drainage noted. The wound margin is flat and intact. There is small (1-33%) red granulation within the wound bed. There is no necrotic tissue within the wound bed. Assessment Active Problems ICD-10 Pressure ulcer of left buttock, stage 2 Unspecified open wound of abdominal wall, unspecified quadrant without penetration into peritoneal cavity, sequela Multiple sclerosis Pressure ulcer of right buttock, stage 2 Plan Follow-up Appointments: Return appointment in 3 weeks. Bathing/ Shower/ Hygiene: May shower with protection but do not get wound dressing(s) wet. - You may shower and use soap and water on the days  that you change dressings. No bathing or soaking. Off-Loading: Turn and reposition every 2 hours Other: - Do not use donut cushion, use a gel cushion. WOUND #3: - Gluteal fold Wound Laterality: Right Cleanser: Wound Cleanser Every Other Day/ Discharge Instructions: Cleanse the wound with wound cleanser prior to applying a clean dressing using gauze sponges, not tissue or cotton balls. Prim Dressing: Maxorb Extra Calcium Alginate 2x2 in Every Other Day/ ary Discharge Instructions: Apply calcium alginate to wound bed as instructed Secondary Dressing: ABD Pad, 5x9 Every Other Day/ Discharge Instructions: Apply over primary dressing as directed. 1. We used alginate in the right gluteal fold 2. The area on the left is closed 3. She still sleeps in a recliner but she is taken away the doughnut and using a pillow. The reason she sleeps in a recliner is chronic back pain which is apparently degenerative 4. She has different incontinence garments that no longer put pressure on these area Electronic Signature(s) Signed: 06/22/2020 5:22:30 PM By: Linton Ham MD Entered By: Linton Ham on 06/22/2020 12:51:26 -------------------------------------------------------------------------------- SuperBill Details Patient Name: Date of Service: Amber Williams, Ellisha D. 06/22/2020 Medical Record Number: [id number] Patient Account Number: [id number] Date of Birth/Sex: Treating RN: 1976-04-27 (45 y.o. Amber Williams, Amber Williams Primary Care Provider: [name] Other Clinician: Referring Provider: Treating Provider/Extender: Neysa Hotter in Treatment: 1 Diagnosis Coding ICD-10 Codes Code Description [phone] Pressure ulcer of left buttock, stage 2 S31.109S Unspecified open wound of abdominal wall, unspecified quadrant without penetration into peritoneal cavity, sequela G35 Multiple sclerosis Facility Procedures CPT4 Code: [id number] Description: 99833 - WOUND CARE VISIT-LEV 3 EST  PT Modifier: Quantity: 1 Electronic Signature(s) Signed: 06/22/2020 5:22:30 PM By: Linton Ham MD Signed: 06/22/2020 5:43:17 PM By: Rhae Hammock RN Entered By: Rhae Hammock on 06/22/2020 12:16:21

## 2020-06-22 NOTE — Progress Notes (Signed)
[name], [name] ([id number]) Visit Report for 06/22/2020 Arrival Information Details Patient Name: Date of Service: Amber Williams NDJoellen, Tullos 06/22/2020 11:00 A M Medical Record Number: [id number] Patient Account Number: [id number] Date of Birth/Sex: Treating RN: 1976/02/08 (45 y.o. Elam Dutch Primary Care Provider: [name] Other Clinician: Referring Provider: Treating Provider/Extender: Neysa Hotter in Treatment: 1 Visit Information History Since Last Visit Added or deleted any medications: No Patient Arrived: Wheel Chair Any new allergies or adverse reactions: No Arrival Time: 11:15 Had a fall or experienced change in No Accompanied By: mother activities of daily living that may affect Transfer Assistance: None risk of falls: Patient Identification Verified: Yes Signs or symptoms of abuse/neglect since last visito No Secondary Verification Process Completed: Yes Hospitalized since last visit: No Patient Requires Transmission-Based Precautions: No Implantable device outside of the clinic excluding No Patient Has Alerts: No cellular tissue based products placed in the center since last visit: Has Dressing in Place as Prescribed: Yes Pain Present Now: Yes Electronic Signature(s) Signed: 06/22/2020 5:38:13 PM By: Baruch Gouty RN, BSN Entered By: Baruch Gouty on 06/22/2020 11:19:55 -------------------------------------------------------------------------------- Clinic Level of Care Assessment Details Patient Name: Date of Service: [name], [name] 06/22/2020 11:00 A M Medical Record Number: [id number] Patient Account Number: [id number] Date of Birth/Sex: Treating RN: November 20, 1975 (45 y.o. Tonita Phoenix, Lauren Primary Care Provider: [name] Other Clinician: Referring Provider: Treating Provider/Extender: Neysa Hotter in Treatment: 1 Clinic Level of Care Assessment Items TOOL 4 Quantity Score X- 1 0 Use  when only an EandM is performed on FOLLOW-UP visit ASSESSMENTS - Nursing Assessment / Reassessment X- 1 10 Reassessment of Co-morbidities (includes updates in patient status) X- 1 5 Reassessment of Adherence to Treatment Plan ASSESSMENTS - Wound and Skin A ssessment / Reassessment X - Simple Wound Assessment / Reassessment - one wound 1 5 []  - 0 Complex Wound Assessment / Reassessment - multiple wounds X- 1 10 Dermatologic / Skin Assessment (not related to wound area) ASSESSMENTS - Focused Assessment []  - 0 Circumferential Edema Measurements - multi extremities X- 1 10 Nutritional Assessment / Counseling / Intervention []  - 0 Lower Extremity Assessment (monofilament, tuning fork, pulses) []  - 0 Peripheral Arterial Disease Assessment (using hand held doppler) ASSESSMENTS - Ostomy and/or Continence Assessment and Care []  - 0 Incontinence Assessment and Management []  - 0 Ostomy Care Assessment and Management (repouching, etc.) PROCESS - Coordination of Care X - Simple Patient / Family Education for ongoing care 1 15 []  - 0 Complex (extensive) Patient / Family Education for ongoing care X- 1 10 Staff obtains Programmer, systems, Records, T Results / Process Orders est []  - 0 Staff telephones HHA, Nursing Homes / Clarify orders / etc []  - 0 Routine Transfer to another Facility (non-emergent condition) []  - 0 Routine Hospital Admission (non-emergent condition) []  - 0 New Admissions / Biomedical engineer / Ordering NPWT Apligraf, etc. , []  - 0 Emergency Hospital Admission (emergent condition) X- 1 10 Simple Discharge Coordination []  - 0 Complex (extensive) Discharge Coordination PROCESS - Special Needs []  - 0 Pediatric / Minor Patient Management []  - 0 Isolation Patient Management []  - 0 Hearing / Language / Visual special needs []  - 0 Assessment of Community assistance (transportation, D/C planning, etc.) []  - 0 Additional assistance / Altered mentation []  - 0 Support  Surface(s) Assessment (bed, cushion, seat, etc.) INTERVENTIONS - Wound Cleansing / Measurement X - Simple Wound Cleansing - one wound 1 5 []  - 0 Complex Wound Cleansing -  multiple wounds X- 1 5 Wound Imaging (photographs - any number of wounds) []  - 0 Wound Tracing (instead of photographs) X- 1 5 Simple Wound Measurement - one wound []  - 0 Complex Wound Measurement - multiple wounds INTERVENTIONS - Wound Dressings X - Small Wound Dressing one or multiple wounds 1 10 []  - 0 Medium Wound Dressing one or multiple wounds []  - 0 Large Wound Dressing one or multiple wounds []  - 0 Application of Medications - topical []  - 0 Application of Medications - injection INTERVENTIONS - Miscellaneous []  - 0 External ear exam []  - 0 Specimen Collection (cultures, biopsies, blood, body fluids, etc.) []  - 0 Specimen(s) / Culture(s) sent or taken to Lab for analysis []  - 0 Patient Transfer (multiple staff / Civil Service fast streamer / Similar devices) []  - 0 Simple Staple / Suture removal (25 or less) []  - 0 Complex Staple / Suture removal (26 or more) []  - 0 Hypo / Hyperglycemic Management (close monitor of Blood Glucose) []  - 0 Ankle / Brachial Index (ABI) - do not check if billed separately X- 1 5 Vital Signs Has the patient been seen at the hospital within the last three years: Yes Total Score: 105 Level Of Care: New/Established - Level 3 Electronic Signature(s) Signed: 06/22/2020 5:43:17 PM By: Rhae Hammock RN Entered By: Rhae Hammock on 06/22/2020 12:16:14 -------------------------------------------------------------------------------- Lower Extremity Assessment Details Patient Name: Date of Service: Amber [name], [name] 06/22/2020 11:00 A M Medical Record Number: [id number] Patient Account Number: [id number] Date of Birth/Sex: Treating RN: 06-18-1975 (46 y.o. Elam Dutch Primary Care Provider: [name] Other Clinician: Referring Provider: Treating Provider/Extender:  Neysa Hotter in Treatment: 1 Electronic Signature(s) Signed: 06/22/2020 5:38:13 PM By: Baruch Gouty RN, BSN Entered By: Baruch Gouty on 06/22/2020 11:23:53 -------------------------------------------------------------------------------- Multi-Disciplinary Care Plan Details Patient Name: Date of Service: Amber [name], Romell D. 06/22/2020 11:00 A M Medical Record Number: [id number] Patient Account Number: [id number] Date of Birth/Sex: Treating RN: [birth year] (45 y.o. Tonita Phoenix, Lauren Primary Care Provider: [name] Other Clinician: Referring Provider: Treating Provider/Extender: Neysa Hotter in Treatment: 1 Active Inactive Orientation to the Wound Care Program Nursing Diagnoses: Knowledge deficit related to the wound healing center program Goals: Patient/caregiver will verbalize understanding of the [place] Date Initiated: 06/15/2020 Target Resolution Date: 07/10/2020 Goal Status: Active Interventions: Provide education on orientation to the wound center Notes: Wound/Skin Impairment Nursing Diagnoses: Impaired tissue integrity Knowledge deficit related to ulceration/compromised skin integrity Goals: Patient will have a decrease in wound volume by X% from date: (specify in notes) Date Initiated: 06/15/2020 Target Resolution Date: 07/09/2020 Goal Status: Active Patient/caregiver will verbalize understanding of skin care regimen Date Initiated: 06/15/2020 Target Resolution Date: 07/10/2020 Goal Status: Active Ulcer/skin breakdown will have a volume reduction of 30% by week 4 Date Initiated: 06/15/2020 Target Resolution Date: 07/10/2020 Goal Status: Active Interventions: Assess patient/caregiver ability to obtain necessary supplies Assess patient/caregiver ability to perform ulcer/skin care regimen upon admission and as needed Assess ulceration(s) every visit Notes: Electronic Signature(s) Signed:  06/22/2020 5:43:17 PM By: Rhae Hammock RN Entered By: Rhae Hammock on 06/22/2020 12:06:49 -------------------------------------------------------------------------------- Pain Assessment Details Patient Name: Date of Service: Amber [name], Pammy D. 06/22/2020 11:00 A M Medical Record Number: [id number] Patient Account Number: [id number] Date of Birth/Sex: Treating RN: July 08, 1975 (45 y.o. Elam Dutch Primary Care Provider: [name] Other Clinician: Referring Provider: Treating Provider/Extender: Neysa Hotter in Treatment: 1 Active Problems Location of Pain Severity and Description of  Pain Patient Has Paino No Site Locations Rate the pain. Current Pain Level: 1 Character of Pain Describe the Pain: Aching, Tender Pain Management and Medication Current Pain Management: Is the Current Pain Management Adequate: Adequate How does your wound impact your activities of daily livingo Sleep: No Bathing: No Appetite: No Relationship With Others: No Bladder Continence: No Emotions: No Bowel Continence: No Work: No Toileting: No Drive: No Dressing: No Hobbies: No Engineer, maintenance) Signed: 06/22/2020 5:38:13 PM By: Baruch Gouty RN, BSN Entered By: Baruch Gouty on 06/22/2020 11:23:46 -------------------------------------------------------------------------------- Patient/Caregiver Education Details Patient Name: Date of Service: [name], Lou D. 2/22/2022andnbsp11:00 A M Medical Record Number: [id number] Patient Account Number: [id number] Date of Birth/Gender: Treating RN: 07-22-1975 (45 y.o. Tonita Phoenix, Lauren Primary Care Physician: [name] Other Clinician: Referring Physician: Treating Physician/Extender: Neysa Hotter in Treatment: 1 Education Assessment Education Provided To: Patient Education Topics Provided Welcome T [facility] [place]: o Methods: Explain/Verbal Responses:  State content correctly Electronic Signature(s) Signed: 06/22/2020 5:43:17 PM By: Rhae Hammock RN Entered By: Rhae Hammock on 06/22/2020 12:07:01 -------------------------------------------------------------------------------- Wound Assessment Details Patient Name: Date of Service: Amber [name], Blake D. 06/22/2020 11:00 A M Medical Record Number: [id number] Patient Account Number: [id number] Date of Birth/Sex: Treating RN: 06-Aug-1975 (45 y.o. Elam Dutch Primary Care Provider: [name] Other Clinician: Referring Provider: Treating Provider/Extender: Neysa Hotter in Treatment: 1 Wound Status Wound Number: 1 Primary Etiology: Pressure Ulcer Wound Location: Left Gluteal fold Wound Status: Healed - Epithelialized Wounding Event: Gradually Appeared Comorbid History: Anemia, Received Chemotherapy, Received Radiation Date Acquired: 04/12/2020 Weeks Of Treatment: 1 Clustered Wound: No Wound Measurements Length: (cm) Width: (cm) Depth: (cm) Area: (cm) Volume: (cm) 0 % Reduction in Area: 100% 0 % Reduction in Volume: 100% 0 Epithelialization: Large (67-100%) 0 Tunneling: No 0 Undermining: No Wound Description Classification: Category/Stage II Wound Margin: Flat and Intact Exudate Amount: None Present Foul Odor After Cleansing: No Slough/Fibrino No Wound Bed Granulation Amount: None Present (0%) Exposed Structure Necrotic Amount: None Present (0%) Fascia Exposed: No Fat Layer (Subcutaneous Tissue) Exposed: No Tendon Exposed: No Muscle Exposed: No Joint Exposed: No Bone Exposed: No Electronic Signature(s) Signed: 06/22/2020 5:38:13 PM By: Baruch Gouty RN, BSN Entered By: Baruch Gouty on 06/22/2020 11:28:26 -------------------------------------------------------------------------------- Wound Assessment Details Patient Name: Date of Service: Amber [name], Arlayne D. 06/22/2020 11:00 A M Medical Record Number:  [id number] Patient Account Number: [id number] Date of Birth/Sex: Treating RN: 1975-08-09 (45 y.o. Elam Dutch Primary Care Provider: [name] Other Clinician: Referring Provider: Treating Provider/Extender: Neysa Hotter in Treatment: 1 Wound Status Wound Number: 2 Primary Etiology: Pressure Ulcer Wound Location: Left, Medial Gluteal fold Wound Status: Healed - Epithelialized Wounding Event: Gradually Appeared Comorbid History: Anemia, Received Chemotherapy, Received Radiation Date Acquired: 04/12/2020 Weeks Of Treatment: 1 Clustered Wound: No Wound Measurements Length: (cm) Width: (cm) Depth: (cm) Area: (cm) Volume: (cm) 0 % Reduction in Area: 100% 0 % Reduction in Volume: 100% 0 Epithelialization: Large (67-100%) 0 Tunneling: No 0 Undermining: No Wound Description Classification: Category/Stage II Wound Margin: Flat and Intact Exudate Amount: None Present Foul Odor After Cleansing: No Slough/Fibrino No Wound Bed Granulation Amount: None Present (0%) Exposed Structure Necrotic Amount: None Present (0%) Fascia Exposed: No Fat Layer (Subcutaneous Tissue) Exposed: No Tendon Exposed: No Muscle Exposed: No Joint Exposed: No Bone Exposed: No Electronic Signature(s) Signed: 06/22/2020 5:38:13 PM By: Baruch Gouty RN, BSN Entered By: Baruch Gouty on 06/22/2020 11:29:10 -------------------------------------------------------------------------------- Wound Assessment Details Patient Name:  Date of Service: [name], PAMALEE MARCOE. 06/22/2020 11:00 A M Medical Record Number: [id number] Patient Account Number: [id number] Date of Birth/Sex: Treating RN: 1975-07-02 (45 y.o. Elam Dutch Primary Care Provider: [name] Other Clinician: Referring Provider: Treating Provider/Extender: Neysa Hotter in Treatment: 1 Wound Status Wound Number: 3 Primary Etiology: Pressure Ulcer Wound Location: Right  Gluteal fold Wound Status: Open Wounding Event: Pressure Injury Comorbid History: Anemia, Received Chemotherapy, Received Radiation Date Acquired: 06/21/2020 Weeks Of Treatment: 0 Clustered Wound: No Wound Measurements Length: (cm) 0.7 Width: (cm) 0.2 Depth: (cm) 0.1 Area: (cm) 0.11 Volume: (cm) 0.011 % Reduction in Area: % Reduction in Volume: Epithelialization: Large (67-100%) Tunneling: No Undermining: No Wound Description Classification: Category/Stage II Wound Margin: Flat and Intact Exudate Amount: None Present Foul Odor After Cleansing: No Slough/Fibrino No Wound Bed Granulation Amount: Small (1-33%) Exposed Structure Granulation Quality: Red Fascia Exposed: No Necrotic Amount: None Present (0%) Fat Layer (Subcutaneous Tissue) Exposed: No Tendon Exposed: No Muscle Exposed: No Joint Exposed: No Bone Exposed: No Limited to Skin Breakdown Electronic Signature(s) Signed: 06/22/2020 5:38:13 PM By: Baruch Gouty RN, BSN Entered By: Baruch Gouty on 06/22/2020 11:32:18 -------------------------------------------------------------------------------- [place] Details Patient Name: Date of Service: Amber [name], Milyn D. 06/22/2020 11:00 A M Medical Record Number: [id number] Patient Account Number: [id number] Date of Birth/Sex: Treating RN: 08/27/1975 (45 y.o. Elam Dutch Primary Care Provider: [name] Other Clinician: Referring Provider: Treating Provider/Extender: Neysa Hotter in Treatment: 1 Vital Signs Time Taken: 11:19 Temperature (F): 97.8 Height (in): 65 Pulse (bpm): 91 Source: Stated Respiratory Rate (breaths/min): 18 Weight (lbs): 200 Blood Pressure (mmHg): 141/81 Source: Stated Reference Range: 80 - 120 mg / dl Body Mass Index (BMI): 33.3 Electronic Signature(s) Signed: 06/22/2020 5:38:13 PM By: Baruch Gouty RN, BSN Entered By: Baruch Gouty on 06/22/2020 11:20:22

## 2020-06-28 ENCOUNTER — Telehealth: Payer: Medicare HMO

## 2020-06-29 DIAGNOSIS — K625 Hemorrhage of anus and rectum: Secondary | ICD-10-CM | POA: Diagnosis not present

## 2020-06-29 DIAGNOSIS — Z85048 Personal history of other malignant neoplasm of rectum, rectosigmoid junction, and anus: Secondary | ICD-10-CM | POA: Diagnosis not present

## 2020-06-29 DIAGNOSIS — C210 Malignant neoplasm of anus, unspecified: Secondary | ICD-10-CM | POA: Diagnosis not present

## 2020-06-29 DIAGNOSIS — R239 Unspecified skin changes: Secondary | ICD-10-CM | POA: Diagnosis not present

## 2020-06-29 DIAGNOSIS — Z08 Encounter for follow-up examination after completed treatment for malignant neoplasm: Secondary | ICD-10-CM | POA: Diagnosis not present

## 2020-06-30 NOTE — H&P (Signed)
   Amber Williams  Patient presents for follow-up of rectal bleeding after undergoing attempted colonoscopy.  I could not complete the colonoscopy due to stool that was present.  Patient could not afford the regular bowel prep.  She states she is having ongoing constipation issues for which she is taking MiraLAX now twice a day.  She states her constipation has been present for some time since her diagnosis of multiple sclerosis.  She also has multiple comorbidities.  She has not been seen by gastroenterology. Objective:    BP 125/83   Pulse (!) 118   Temp 97.7 F (36.5 C) (Oral)   Resp 16   Ht 5\' 5"  (1.651 m)   Wt 199 lb (90.3 kg)   SpO2 98%   BMI 33.12 kg/m   General:  alert, cooperative and no distress  Head is normocephalic, atraumatic Lungs are clear to auscultation with equal breath sounds bilaterally Heart examination reveals a regular rate and rhythm without S3, S4, murmurs Rectal examination was deferred to the procedure     Assessment:    Ongoing intermittent rectal bleeding with constipation, history of anal carcinoma with completion of therapy    Plan:   I told her that I should soon be receiving samples of Suprep which she could use for bowel preparation for a repeat colonoscopy.  I will be out of the office for the next 4 to 6 weeks I told her should her rectal bleeding worsen, or with constipation becomes more of an issue, referral to a gastroenterologist would be appropriate.  She will call my office at the end of January to schedule colonoscopy.  If she needs a colonoscopy sooner by GI, I am fine with that.

## 2020-07-02 ENCOUNTER — Other Ambulatory Visit: Payer: Self-pay

## 2020-07-02 ENCOUNTER — Other Ambulatory Visit (HOSPITAL_COMMUNITY)
Admission: RE | Admit: 2020-07-02 | Discharge: 2020-07-02 | Disposition: A | Discharge status: Home / Self Care | Payer: Medicare HMO | Source: Ambulatory Visit | Attending: General Surgery | Admitting: General Surgery

## 2020-07-02 DIAGNOSIS — Z20822 Contact with and (suspected) exposure to covid-19: Secondary | ICD-10-CM | POA: Insufficient documentation

## 2020-07-02 DIAGNOSIS — Z01812 Encounter for preprocedural laboratory examination: Secondary | ICD-10-CM | POA: Diagnosis not present

## 2020-07-03 LAB — SARS CORONAVIRUS 2 (TAT 6-24 HRS): SARS Coronavirus 2: NEGATIVE

## 2020-07-06 ENCOUNTER — Other Ambulatory Visit: Payer: Self-pay

## 2020-07-06 ENCOUNTER — Ambulatory Visit (HOSPITAL_COMMUNITY)
Admission: RE | Admit: 2020-07-06 | Discharge: 2020-07-06 | Disposition: A | Discharge status: Home / Self Care | Payer: Medicare HMO | Source: Ambulatory Visit | Attending: General Surgery | Admitting: General Surgery

## 2020-07-06 ENCOUNTER — Encounter (HOSPITAL_BASED_OUTPATIENT_CLINIC_OR_DEPARTMENT_OTHER): Payer: Medicare HMO | Admitting: Internal Medicine

## 2020-07-06 ENCOUNTER — Encounter (HOSPITAL_COMMUNITY): Payer: Self-pay | Admitting: General Surgery

## 2020-07-06 ENCOUNTER — Encounter (HOSPITAL_COMMUNITY)
Admission: RE | Disposition: A | Discharge status: Home / Self Care | Payer: Self-pay | Source: Ambulatory Visit | Attending: General Surgery

## 2020-07-06 DIAGNOSIS — G35 Multiple sclerosis: Secondary | ICD-10-CM | POA: Insufficient documentation

## 2020-07-06 DIAGNOSIS — K625 Hemorrhage of anus and rectum: Secondary | ICD-10-CM | POA: Insufficient documentation

## 2020-07-06 DIAGNOSIS — C210 Malignant neoplasm of anus, unspecified: Secondary | ICD-10-CM | POA: Diagnosis not present

## 2020-07-06 DIAGNOSIS — Z85048 Personal history of other malignant neoplasm of rectum, rectosigmoid junction, and anus: Secondary | ICD-10-CM

## 2020-07-06 DIAGNOSIS — K5900 Constipation, unspecified: Secondary | ICD-10-CM | POA: Insufficient documentation

## 2020-07-06 DIAGNOSIS — Z1211 Encounter for screening for malignant neoplasm of colon: Secondary | ICD-10-CM

## 2020-07-06 HISTORY — PX: COLONOSCOPY: SHX5424

## 2020-07-06 SURGERY — COLONOSCOPY
Anesthesia: Moderate Sedation

## 2020-07-06 MED ORDER — SODIUM CHLORIDE 0.9 % IV SOLN: INTRAVENOUS | Status: DC

## 2020-07-06 MED ORDER — MIDAZOLAM HCL 5 MG/5ML IJ SOLN
INTRAMUSCULAR | Status: DC | PRN
  Administered 2020-07-06: 3 mg via INTRAVENOUS

## 2020-07-06 MED ORDER — MEPERIDINE HCL 50 MG/ML IJ SOLN
INTRAMUSCULAR | Status: DC | PRN
  Administered 2020-07-06: 50 mg via INTRAVENOUS

## 2020-07-06 MED ORDER — STERILE WATER FOR IRRIGATION IR SOLN
Status: DC | PRN
  Administered 2020-07-06: 1.5 mL

## 2020-07-06 MED ORDER — MEPERIDINE HCL 50 MG/ML IJ SOLN
INTRAMUSCULAR | Status: AC
  Filled 2020-07-06: qty 1

## 2020-07-06 MED ORDER — MIDAZOLAM HCL 5 MG/5ML IJ SOLN
INTRAMUSCULAR | Status: AC
  Filled 2020-07-06: qty 5

## 2020-07-06 NOTE — Discharge Instructions (Signed)
Colonoscopy, Adult, Care After  This sheet gives you information about how to care for yourself after your procedure. Your health care provider may also give you more specific instructions. If you have problems or questions, contact your health care provider.  What can I expect after the procedure?  After the procedure, it is common to have:  · A small amount of blood in your stool for 24 hours after the procedure.  · Some gas.  · Mild cramping or bloating of your abdomen.  Follow these instructions at home:  Eating and drinking    · Drink enough fluid to keep your urine pale yellow.  · Follow instructions from your health care provider about eating or drinking restrictions.  · Resume your normal diet as instructed by your health care provider. Avoid heavy or fried foods that are hard to digest.  Activity  · Rest as told by your health care provider.  · Avoid sitting for a long time without moving. Get up to take short walks every 1-2 hours. This is important to improve blood flow and breathing. Ask for help if you feel weak or unsteady.  · Return to your normal activities as told by your health care provider. Ask your health care provider what activities are safe for you.  Managing cramping and bloating    · Try walking around when you have cramps or feel bloated.  · Apply heat to your abdomen as told by your health care provider. Use the heat source that your health care provider recommends, such as a moist heat pack or a heating pad.  ? Place a towel between your skin and the heat source.  ? Leave the heat on for 20-30 minutes.  ? Remove the heat if your skin turns bright red. This is especially important if you are unable to feel pain, heat, or cold. You may have a greater risk of getting burned.  General instructions  · If you were given a sedative during the procedure, it can affect you for several hours. Do not drive or operate machinery until your health care provider says that it is safe.  · For the first  24 hours after the procedure:  ? Do not sign important documents.  ? Do not drink alcohol.  ? Do your regular daily activities at a slower pace than normal.  ? Eat soft foods that are easy to digest.  · Take over-the-counter and prescription medicines only as told by your health care provider.  · Keep all follow-up visits as told by your health care provider. This is important.  Contact a health care provider if:  · You have blood in your stool 2-3 days after the procedure.  Get help right away if you have:  · More than a small spotting of blood in your stool.  · Large blood clots in your stool.  · Swelling of your abdomen.  · Nausea or vomiting.  · A fever.  · Increasing pain in your abdomen that is not relieved with medicine.  Summary  · After the procedure, it is common to have a small amount of blood in your stool. You may also have mild cramping and bloating of your abdomen.  · If you were given a sedative during the procedure, it can affect you for several hours. Do not drive or operate machinery until your health care provider says that it is safe.  · Get help right away if you have a lot of blood in your   stool, nausea or vomiting, a fever, or increased pain in your abdomen.  This information is not intended to replace advice given to you by your health care provider. Make sure you discuss any questions you have with your health care provider.  Document Revised: 04/11/2019 Document Reviewed: 11/11/2018  Elsevier Patient Education © 2021 [employer]

## 2020-07-06 NOTE — Interval H&P Note (Signed)
History and Physical Interval Note:  07/06/2020 7:18 AM  Amber Williams  has presented today for surgery, with the diagnosis of Anal cancer.  The various methods of treatment have been discussed with the patient and family. After consideration of risks, benefits and other options for treatment, the patient has consented to  Procedure(s): COLONOSCOPY (N/A) as a surgical intervention.  The patient's history has been reviewed, patient examined, no change in status, stable for surgery.  I have reviewed the patient's chart and labs.  Questions were answered to the patient's satisfaction.     Aviva Signs

## 2020-07-06 NOTE — Op Note (Signed)
[facility] Patient Name: Amber Williams Procedure Date: 07/06/2020 7:14 AM MRN: [id number] Date of Birth: December 18, 1975 Attending MD: Aviva Signs , MD CSN: [id number] Age: 45 Admit Type: Outpatient Procedure:                Colonoscopy Indications:              Personal history of malignant neoplasm of the anal                            canal Providers:                Aviva Signs, MD, Otis Peak B. Sharon Seller, RN, Nelma Rothman, Technician Referring MD:              Medicines:                Midazolam 3 mg IV, Meperidine 50 mg IV Complications:            No immediate complications. Estimated Blood Loss:     Estimated blood loss: none. Procedure:                Pre-Anesthesia Assessment:                           - Prior to the procedure, a History and Physical                            was performed, and patient medications and                            allergies were reviewed. The patient is competent.                            The risks and benefits of the procedure and the                            sedation options and risks were discussed with the                            patient. All questions were answered and informed                            consent was obtained. Patient identification and                            proposed procedure were verified by the physician,                            the nurse and the technician in the endoscopy                            suite. Mental Status Examination: normal. Airway  Examination: normal oropharyngeal airway and neck                            mobility. Respiratory Examination: clear to                            auscultation. CV Examination: RRR, no murmurs, no                            S3 or S4. Prophylactic Antibiotics: The patient                            does not require prophylactic antibiotics. Prior                            Anticoagulants: The patient has taken  no previous                            anticoagulant or antiplatelet agents. ASA Grade                            Assessment: II - A patient with mild systemic                            disease. After reviewing the risks and benefits,                            the patient was deemed in satisfactory condition to                            undergo the procedure. The anesthesia plan was to                            use moderate sedation / analgesia (conscious                            sedation). Immediately prior to administration of                            medications, the patient was re-assessed for                            adequacy to receive sedatives. The heart rate,                            respiratory rate, oxygen saturations, blood                            pressure, adequacy of pulmonary ventilation, and                            response to care were monitored throughout the  procedure. The physical status of the patient was                            re-assessed after the procedure.                           After obtaining informed consent, the colonoscope                            was passed under direct vision. Throughout the                            procedure, the patient's blood pressure, pulse, and                            oxygen saturations were monitored continuously. The                            [id number]-[id number] ([id number]) scope was introduced through                            the anus and advanced to the the cecum, identified                            by the appendiceal orifice, ileocecal valve and                            palpation. No anatomical landmarks were                            photographed. The entire colon was examined. The                            colonoscopy was somewhat difficult due to poor                            bowel prep. The quality of the bowel preparation                            was poor. The total  duration of the procedure was                            32 minutes. Scope In: 7:34:01 AM Scope Out: 8:04:54 AM Scope Withdrawal Time: 0 hours 7 minutes 4 seconds  Total Procedure Duration: 0 hours 30 minutes 53 seconds  Findings:      The perianal and digital rectal examinations were normal. No obvious       evidence of recurrent anal cancer.      The entire examined colon appeared normal. No mass lesions, diverticuli       noted. Small polyps may not be visualized due to poor bowel prep. Impression:               - Preparation of the colon was poor.                           -  The entire examined colon is normal.                           - No specimens collected. Moderate Sedation:      Moderate (conscious) sedation was administered by the endoscopy nurse       and supervised by the endoscopist. The patient's oxygen saturation,       heart rate, blood pressure and response to care were monitored. Recommendation:           - Written discharge instructions were provided to                            the patient.                           - The signs and symptoms of potential delayed                            complications were discussed with the patient.                           - Patient has a contact number available for                            emergencies.                           - Return to normal activities tomorrow.                           - Resume previous diet.                           - Continue present medications.                           - Repeat colonoscopy in 10 years for screening                            purposes.                           - Anal cancer survellance per Oncology Procedure Code(s):        --- Professional ---                           [phone], Colonoscopy, flexible; diagnostic, including                            collection of specimen(s) by brushing or washing,                            when performed (separate procedure) Diagnosis  Code(s):        --- Professional ---                           J88.416, Personal history of other malignant  neoplasm of rectum, rectosigmoid junction, and anus CPT copyright 2019 American Medical Association. All rights reserved. The codes documented in this report are preliminary and upon coder review may  be revised to meet current compliance requirements. Aviva Signs, MD Aviva Signs, MD 07/06/2020 8:16:39 AM This report has been signed electronically. Number of Addenda: 0

## 2020-07-09 ENCOUNTER — Other Ambulatory Visit: Payer: Self-pay | Admitting: *Deleted

## 2020-07-09 ENCOUNTER — Encounter (HOSPITAL_COMMUNITY): Payer: Self-pay | Admitting: General Surgery

## 2020-07-09 DIAGNOSIS — G35 Multiple sclerosis: Secondary | ICD-10-CM

## 2020-07-09 MED ORDER — AMITRIPTYLINE HCL 50 MG PO TABS: 50.0000 mg | ORAL_TABLET | Freq: Every day | ORAL | 0 refills | Status: DC

## 2020-07-12 ENCOUNTER — Encounter (HOSPITAL_BASED_OUTPATIENT_CLINIC_OR_DEPARTMENT_OTHER): Payer: Medicare HMO | Admitting: Internal Medicine

## 2020-07-15 DIAGNOSIS — R5383 Other fatigue: Secondary | ICD-10-CM | POA: Diagnosis not present

## 2020-07-15 DIAGNOSIS — M21371 Foot drop, right foot: Secondary | ICD-10-CM | POA: Diagnosis not present

## 2020-07-15 DIAGNOSIS — G5731 Lesion of lateral popliteal nerve, right lower limb: Secondary | ICD-10-CM | POA: Diagnosis not present

## 2020-07-15 DIAGNOSIS — G35 Multiple sclerosis: Secondary | ICD-10-CM | POA: Diagnosis not present

## 2020-07-28 ENCOUNTER — Ambulatory Visit (INDEPENDENT_AMBULATORY_CARE_PROVIDER_SITE_OTHER): Payer: Medicare HMO | Admitting: Licensed Clinical Social Worker

## 2020-07-28 DIAGNOSIS — G35 Multiple sclerosis: Secondary | ICD-10-CM

## 2020-07-28 DIAGNOSIS — F324 Major depressive disorder, single episode, in partial remission: Secondary | ICD-10-CM | POA: Diagnosis not present

## 2020-07-28 DIAGNOSIS — R69 Illness, unspecified: Secondary | ICD-10-CM | POA: Diagnosis not present

## 2020-07-28 DIAGNOSIS — C210 Malignant neoplasm of anus, unspecified: Secondary | ICD-10-CM

## 2020-07-28 DIAGNOSIS — E039 Hypothyroidism, unspecified: Secondary | ICD-10-CM | POA: Diagnosis not present

## 2020-07-28 DIAGNOSIS — F4310 Post-traumatic stress disorder, unspecified: Secondary | ICD-10-CM

## 2020-07-28 NOTE — Chronic Care Management (AMB) (Signed)
Chronic Care Management    Clinical Social Work Note  07/28/2020 Name: Amber Williams MRN: [id number] DOB: 1976-03-06  Amber Williams is a 45 y.o. year old female who is a primary care patient of [name], [name], MD. The CCM team was consulted to assist the patient with chronic disease management and/or care coordination needs related to: [employer] .   LCSW was not able to engage with patient by telephone for follow up visit in response to provider referral for social work chronic care management and care coordination services. However, LCSW did leave phone message for [facility] requesting she please return call to LCSW at [phone]  Consent to Services:  The patient was given information about Chronic Care Management services, agreed to services, and gave verbal consent prior to initiation of services.  Please see initial visit note for detailed documentation.   Patient agreed to services and consent obtained.   Assessment: Review of patient past medical history, allergies, medications, and health status, including review of relevant consultants reports was performed today as part of a comprehensive evaluation and provision of chronic care management and care coordination services.     SDOH (Social Determinants of Health) assessments and interventions performed:    Advanced Directives Status: See Vynca application for related entries.  CCM Care Plan  Allergies  Allergen Reactions  . Other Hives    Plastic Tape  . Tecfidera [Dimethyl Fumarate] Itching    Outpatient Encounter Medications as of 07/28/2020  Medication Sig  . ALLERGY RELIEF 180 MG tablet Take 180 mg by mouth daily as needed for allergies.  [name] ALTAVERA 0.15-30 MG-MCG tablet TAKE 1 TABLET BY MOUTH EVERY DAY (Patient taking differently: Take 1 tablet by mouth daily.)  . amitriptyline (ELAVIL) 50 MG tablet Take 1 tablet (50 mg total) by mouth at bedtime.  [name] aspirin 81 MG EC tablet Take 81 mg by mouth at  bedtime.  . Cholecalciferol (VITAMIN D3) 5000 units CAPS Take 5,000 Units by mouth every morning.  . Cyanocobalamin 5000 MCG TBDP Take 5,000 mcg by mouth daily.  [name] escitalopram (LEXAPRO) 10 MG tablet Take 1 tablet (10 mg total) by mouth daily.  . flurazepam (DALMANE) 30 MG capsule Take 1 capsule (30 mg total) by mouth at bedtime.  [name] levothyroxine (SYNTHROID) 112 MCG tablet TAKE 1 TABLET DAILY BEFORE BREAKFAST (Patient taking differently: Take 125 mcg by mouth daily before breakfast.)  . Multiple Vitamins-Minerals (MULTIVITAMIN WITH MINERALS) tablet Take 1 tablet by mouth daily. One a day woman  . omeprazole (PRILOSEC) 20 MG capsule Take 1 capsule (20 mg total) by mouth daily.  . traZODone (DESYREL) 150 MG tablet USE FROM 1/3 TO 1 TABLET ASNEEDED FOR SLEEP (Patient taking differently: Take 150 mg by mouth at bedtime.)   No facility-administered encounter medications on file as of 07/28/2020.    Patient Active Problem List   Diagnosis Date Noted  . Dark red stool   . Personal history of malignant neoplasm of rectum, rectosigmoid junction, and anus   . Genetic testing 08/11/2019  . Family history of breast cancer   . Port-A-Cath in place 01/23/2019  . Anal cancer ([address]) 01/10/2019  . Anal condyloma   . Hemorrhoids, external, with complication 66/29/4765  . Neurogenic bladder 08/03/2016  . Gait abnormality 08/03/2016  . Right foot drop 08/03/2016  . Hypothyroidism 01/31/2016  . Insomnia 01/31/2016  . Multiple sclerosis ([place]) 01/31/2016  . PTSD (post-traumatic stress disorder) 01/31/2016  . Depression 01/16/2011    Conditions to be addressed/monitored: ;  Monitor mobility needs of client  Care Plan : LCSW Care Plan  Updates made by Katha Cabal, LCSW since 07/28/2020 12:00 AM    Problem: Emotional Distress     Goal: Manage Anxiety and Stress Issues Faced   Start Date: 07/28/2020  Expected End Date: 10/28/2020  This Visit's Progress: On track  Priority: Medium  Note:   Current  Barriers:  . Chronic Mental Health needs related to anxiety and stress management . Mobility issues . Suicidal Ideation/Homicidal Ideation: No  Clinical Social Work Goal(s):  . patient will work with SW monthly by telephone or in person to reduce or manage symptoms related to anxiety and stress issues faced . Patient will communicate with LCSW in next 30 days to discuss mobility issues of client  Interventions: . 1:1 collaboration with [name], MD regarding development and update of comprehensive plan of care as evidenced by provider attestation and co-signature . Chart review completed to assess client needs . LCSW called client phone number today but was not able to speak via phone with client; however, LCSW did leave phone message for [facility] asking her to return call to LCSW at [phone]  Patient Self Care Activities:  . Takes medications as prescribed . Attends scheduled medical appointments  Patient Coping Strengths:  . Has support from her parents . Has transport help as needed . Takes time to rest and relax as needed  Patient Self Care Deficits:  [name] Mobility issues . Daily challenges with managing MS symptoms faced  Patient Goals:  - spend time or talk with others every day - practice relaxation or meditation daily - keep a calendar with appointment dates  Follow Up Plan: LCSW to call client on 09/01/20 to assess client needs     [name].Clay Solum MSW, LCSW Licensed Clinical Social Worker [facility] Care Management [phone]

## 2020-07-28 NOTE — Patient Instructions (Signed)
Visit Information  PATIENT GOALS: Goals Addressed            This Visit's Progress   . Anxiety Symptoms Monitored and Managed        Timeframe: Short-Term   This Visit's Progress: On Track   Priority: Medium   Start Date  07/28/20  Expected End Date  10/28/20  Completed Date  Patient Self Care Activities:  . Takes medications as prescribed . Attends scheduled medical appointments  Patient Coping Strengths:  . Has support from her parents . Has transport help as needed . Takes time to rest and relax as needed  Patient Self Care Deficits:  [name] Mobility issues . Daily challenges with managing MS symptoms faced  Patient Goals:  - spend time or talk with others every day - practice relaxation or meditation daily - keep a calendar with appointment dates  Follow Up Plan: LCSW to call client on 09/01/20 to assess client needs       [name].Tehillah Cipriani MSW, LCSW Licensed Clinical Social Worker [facility] Care Management [phone]

## 2020-07-29 ENCOUNTER — Encounter (HOSPITAL_BASED_OUTPATIENT_CLINIC_OR_DEPARTMENT_OTHER): Payer: Medicare HMO | Attending: Internal Medicine | Admitting: Internal Medicine

## 2020-07-29 ENCOUNTER — Other Ambulatory Visit: Payer: Self-pay

## 2020-07-29 DIAGNOSIS — Z9221 Personal history of antineoplastic chemotherapy: Secondary | ICD-10-CM | POA: Insufficient documentation

## 2020-07-29 DIAGNOSIS — B372 Candidiasis of skin and nail: Secondary | ICD-10-CM | POA: Diagnosis not present

## 2020-07-29 DIAGNOSIS — L89322 Pressure ulcer of left buttock, stage 2: Secondary | ICD-10-CM | POA: Diagnosis not present

## 2020-07-29 DIAGNOSIS — G35 Multiple sclerosis: Secondary | ICD-10-CM | POA: Insufficient documentation

## 2020-07-29 DIAGNOSIS — Z923 Personal history of irradiation: Secondary | ICD-10-CM | POA: Diagnosis not present

## 2020-07-29 DIAGNOSIS — E039 Hypothyroidism, unspecified: Secondary | ICD-10-CM | POA: Insufficient documentation

## 2020-07-29 DIAGNOSIS — X58XXXA Exposure to other specified factors, initial encounter: Secondary | ICD-10-CM | POA: Insufficient documentation

## 2020-07-29 DIAGNOSIS — L89312 Pressure ulcer of right buttock, stage 2: Secondary | ICD-10-CM | POA: Diagnosis not present

## 2020-07-29 DIAGNOSIS — L988 Other specified disorders of the skin and subcutaneous tissue: Secondary | ICD-10-CM | POA: Diagnosis not present

## 2020-07-29 DIAGNOSIS — S31109S Unspecified open wound of abdominal wall, unspecified quadrant without penetration into peritoneal cavity, sequela: Secondary | ICD-10-CM | POA: Insufficient documentation

## 2020-07-29 DIAGNOSIS — L304 Erythema intertrigo: Secondary | ICD-10-CM | POA: Insufficient documentation

## 2020-07-29 DIAGNOSIS — Z86718 Personal history of other venous thrombosis and embolism: Secondary | ICD-10-CM | POA: Diagnosis not present

## 2020-07-29 NOTE — Progress Notes (Addendum)
[name], Williams ([id number]) Visit Report for 07/29/2020 HPI Details Patient Name: Date of Service: [name] Williams, [place] 07/29/2020 3:15 PM Medical Record Number: [id number] Patient Account Number: [id number] Date of Birth/Sex: Treating RN: 10/19/1975 (45 y.o. [name] Williams Primary Care Provider: [name] Other Clinician: Referring Provider: Treating Provider/Extender: Neysa Hotter in Treatment: 6 History of Present Illness HPI Description: ADMISSION 06/15/2020; This is an unfortunate 45 year old woman who has a history of MS. He also has a history of localized anal cancer which underwent a a complete resection as well as radiation and apparently chemotherapy according the patient at [facility]. She has had some rectal bleeding and she is followed by Dr. Arnoldo Morale in [place]. In any case she was seen by her primary doctor last month with wounds in her bilateral gluteal folds as well as a lower abdominal pannus. At that point there was sizable open areas. She was given doxycycline Valtrex mupirocin and then Silvadene. She arrives in clinic today with her inguinal folds clear. She had wounds in the lower abdominal pannus all of this is healed. In the gluteal folds the area on the right is healed she has 2 small open areas on the left. Past medical history includes multiple sclerosis, hypothyroidism, anal cancer as described, history of a DVT in 2013 right foot drop 2/22; the areas we are concerned about in the left buttock near her gluteal fold is clear. She has a new area on the right side but it is very small and superficial 3/31; this is a patient who has not been here in more than a month. She has MS and also has a history of anal cancer which has been resected and then treated with radiation and apparently chemo chemotherapy. We had her in the clinic about 5 weeks ago with an area of what I thought was a pressure injury near the left buttock near the gluteal  fold. She had previously had an area on the right side as well. She arrives in clinic today with a complaint of marked irritation and pain in her entire lower abdominal pannus folds groin and buttocks. Electronic Signature(s) Signed: 07/29/2020 5:43:02 PM By: Linton Ham MD Entered By: Linton Ham on 07/29/2020 17:25:49 -------------------------------------------------------------------------------- Physical Exam Details Patient Name: Date of Service: [name] Williams, [name] 07/29/2020 3:15 PM Medical Record Number: [id number] Patient Account Number: [id number] Date of Birth/Sex: Treating RN: 09-May-1975 (45 y.o. [name] Williams Primary Care Provider: [name] Other Clinician: Referring Provider: Treating Provider/Extender: Neysa Hotter in Treatment: 6 Constitutional Sitting or standing Blood Pressure is within target range for patient.. Pulse regular and within target range for patient.[name] Respirations regular, non-labored and within target range.. Temperature is normal and within the target range for the patient.[name] Appears in no distress. Notes Wound exam; the patient has several superficial wounds in her lower abdominal pannus. As well in her posterior gluteal fold. This is all surrounded by a really extensive Candida intertrigo. Satellite lesions are quite characteristic. All of this would be angry burning and itchy all of which she describes. She wears plastic incontinence pants which have no doubt added to the moisture and irritation. Electronic Signature(s) Signed: 07/29/2020 5:43:02 PM By: Linton Ham MD Entered By: Linton Ham on 07/29/2020 17:27:04 -------------------------------------------------------------------------------- Physician Orders Details Patient Name: Date of Service: [name] Williams, [name] 07/29/2020 3:15 PM Medical Record Number: [id number] Patient Account Number: [id number] Date of Birth/Sex: Treating RN: [birth year] (45 y.o.  [name] Williams Primary Care Provider: Livia Snellen,  [name] Williams Other Clinician: Referring Provider: Treating Provider/Extender: Neysa Hotter in Treatment: 6 Verbal / Phone Orders: No Diagnosis Coding Follow-up Appointments Return appointment in 3 weeks. Bathing/ Shower/ Hygiene May shower with protection but do not get wound dressing(s) wet. - You may shower and use soap and water on the days that you change dressings. No bathing or soaking. Off-Loading Turn and reposition every 2 hours Other: - Do not use donut cushion, use a gel cushion. Additional Orders / Instructions Wound #3 Right Gluteal fold Other: - Patient to completely dry with towels and air dry the skin folds. Patient to use hand towels in skin fold of abdomen and between leg and groin. Change underwear and hand towels frequently. Patient to check into purchasing interdry and use in place of hand towels. ***apply antifungal powder to skin folds daily. Pick up from pharmacy along with oral antifungal medication.*** Wound #4 Right Groin Other: - Patient to completely dry with towels and air dry the skin folds. Patient to use hand towels in skin fold of abdomen and between leg and groin. Change underwear and hand towels frequently. Patient to check into purchasing interdry and use in place of hand towels. ***apply antifungal powder to skin folds daily. Pick up from pharmacy along with oral antifungal medication.*** Wound #5 Left Groin Other: - Patient to completely dry with towels and air dry the skin folds. Patient to use hand towels in skin fold of abdomen and between leg and groin. Change underwear and hand towels frequently. Patient to check into purchasing interdry and use in place of hand towels. ***apply antifungal powder to skin folds daily. Pick up from pharmacy along with oral antifungal medication.*** Wound #6 Abdomen - Lower Quadrant Other: - Patient to completely dry with towels and air dry  the skin folds. Patient to use hand towels in skin fold of abdomen and between leg and groin. Change underwear and hand towels frequently. Patient to check into purchasing interdry and use in place of hand towels. ***apply antifungal powder to skin folds daily. Pick up from pharmacy along with oral antifungal medication.*** Wound Treatment Wound #3 - Gluteal fold Wound Laterality: Right Cleanser: Soap and Water Every Other Day Discharge Instructions: May shower and wash wound with dial antibacterial soap and water prior to dressing change. Peri-Wound Care: Nystop Powder (Nystatin) Every Other Day Discharge Instructions: Apply Nystop (Nystatin) Powder as instructed Prim Dressing: KerraCel Ag Gelling Fiber Dressing, 4x5 in (silver alginate) Every Other Day ary Discharge Instructions: Apply silver alginate to wound bed as instructed Secondary Dressing: ABD Pad, 5x9 Every Other Day Discharge Instructions: Apply over primary dressing as directed. Wound #4 - Groin Wound Laterality: Right Cleanser: Soap and Water Every Other Day Discharge Instructions: May shower and wash wound with dial antibacterial soap and water prior to dressing change. Peri-Wound Care: Nystop Powder (Nystatin) Every Other Day Discharge Instructions: Apply Nystop (Nystatin) Powder as instructed Prim Dressing: KerraCel Ag Gelling Fiber Dressing, 4x5 in (silver alginate) Every Other Day ary Discharge Instructions: Apply silver alginate to wound bed as instructed Secondary Dressing: ABD Pad, 5x9 Every Other Day Discharge Instructions: Apply over primary dressing as directed. Wound #5 - Groin Wound Laterality: Left Cleanser: Soap and Water Every Other Day Discharge Instructions: May shower and wash wound with dial antibacterial soap and water prior to dressing change. Peri-Wound Care: Nystop Powder (Nystatin) Every Other Day Discharge Instructions: Apply Nystop (Nystatin) Powder as instructed Prim Dressing: KerraCel Ag  Gelling Fiber Dressing, 4x5 in (silver alginate) Every  Other Day ary Discharge Instructions: Apply silver alginate to wound bed as instructed Secondary Dressing: ABD Pad, 5x9 Every Other Day Discharge Instructions: Apply over primary dressing as directed. Wound #6 - Abdomen - Lower Quadrant Cleanser: Soap and Water Every Other Day Discharge Instructions: May shower and wash wound with dial antibacterial soap and water prior to dressing change. Peri-Wound Care: Nystop Powder (Nystatin) Every Other Day Discharge Instructions: Apply Nystop (Nystatin) Powder as instructed Prim Dressing: KerraCel Ag Gelling Fiber Dressing, 4x5 in (silver alginate) Every Other Day ary Discharge Instructions: Apply silver alginate to wound bed as instructed Secondary Dressing: ABD Pad, 5x9 Every Other Day Discharge Instructions: Apply over primary dressing as directed. Patient Medications llergies: Tecfidera A Notifications Medication Indication Start End intertirgo 07/29/2020 terbinafine HCl DOSE oral 250 mg tablet - 1 tablet oral qd for 2 weeks 07/29/2020 nystatin DOSE topical 100,000 unit/gram powder - powder topical to affected area bid Electronic Signature(s) Signed: 07/29/2020 4:52:07 PM By: Linton Ham MD Entered By: Linton Ham on 07/29/2020 16:52:06 -------------------------------------------------------------------------------- Problem List Details Patient Name: Date of Service: [name] Williams, [place] D. 07/29/2020 3:15 PM Medical Record Number: [id number] Patient Account Number: [id number] Date of Birth/Sex: Treating RN: 1976/01/02 (46 y.o. Helene Shoe, Tammi Klippel Primary Care Provider: [name] Other Clinician: Referring Provider: Treating Provider/Extender: Neysa Hotter in Treatment: 6 Active Problems ICD-10 Encounter Code Description Active Date MDM Diagnosis [phone] Pressure ulcer of left buttock, stage 2 06/15/2020 No Yes S31.109S Unspecified open wound of  abdominal wall, unspecified quadrant without 06/15/2020 No Yes penetration into peritoneal cavity, sequela G35 Multiple sclerosis 06/15/2020 No Yes L89.312 Pressure ulcer of right buttock, stage 2 06/22/2020 No Yes L30.4 Erythema intertrigo 07/29/2020 No Yes Inactive Problems Resolved Problems Electronic Signature(s) Signed: 07/29/2020 5:43:02 PM By: Linton Ham MD Entered By: Linton Ham on 07/29/2020 17:24:11 -------------------------------------------------------------------------------- Progress Note Details Patient Name: Date of Service: [name] Williams, Marycarmen D. 07/29/2020 3:15 PM Medical Record Number: [id number] Patient Account Number: [id number] Date of Birth/Sex: Treating RN: 1975-12-28 (45 y.o. [name] Williams Primary Care Provider: [name] Other Clinician: Referring Provider: Treating Provider/Extender: Neysa Hotter in Treatment: 6 Subjective History of Present Illness (HPI) ADMISSION 06/15/2020; This is an unfortunate 45 year old woman who has a history of MS. He also has a history of localized anal cancer which underwent a a complete resection as well as radiation and apparently chemotherapy according the patient at [facility]. She has had some rectal bleeding and she is followed by Dr. Arnoldo Morale in [place]. In any case she was seen by her primary doctor last month with wounds in her bilateral gluteal folds as well as a lower abdominal pannus. At that point there was sizable open areas. She was given doxycycline Valtrex mupirocin and then Silvadene. She arrives in clinic today with her inguinal folds clear. She had wounds in the lower abdominal pannus all of this is healed. In the gluteal folds the area on the right is healed she has 2 small open areas on the left. Past medical history includes multiple sclerosis, hypothyroidism, anal cancer as described, history of a DVT in 2013 right foot drop 2/22; the areas we are concerned about in the left  buttock near her gluteal fold is clear. She has a new area on the right side but it is very small and superficial 3/31; this is a patient who has not been here in more than a month. She has MS and also has a history of anal cancer which has been resected and  then treated with radiation and apparently chemo chemotherapy. We had her in the clinic about 5 weeks ago with an area of what I thought was a pressure injury near the left buttock near the gluteal fold. She had previously had an area on the right side as well. She arrives in clinic today with a complaint of marked irritation and pain in her entire lower abdominal pannus folds groin and buttocks. Objective Constitutional Sitting or standing Blood Pressure is within target range for patient.. Pulse regular and within target range for patient.[name] Respirations regular, non-labored and within target range.. Temperature is normal and within the target range for the patient.[name] Appears in no distress. Vitals Time Taken: 3:38 PM, Height: 65 in, Weight: 200 lbs, BMI: 33.3, Temperature: 98.1 F, Pulse: 101 bpm, Respiratory Rate: 18 breaths/min, Blood Pressure: 110/74 mmHg. General Notes: Wound exam; the patient has several superficial wounds in her lower abdominal pannus. As well in her posterior gluteal fold. This is all surrounded by a really extensive Candida intertrigo. Satellite lesions are quite characteristic. All of this would be angry burning and itchy all of which she describes. She wears plastic incontinence pants which have no doubt added to the moisture and irritation. Integumentary (Hair, Skin) Wound #3 status is Open. Original cause of wound was Pressure Injury. The date acquired was: 06/21/2020. The wound has been in treatment 5 weeks. The wound is located on the Right Gluteal fold. The wound measures 0.7cm length x 2cm width x 0.1cm depth; 1.1cm^2 area and 0.11cm^3 volume. There is Fat Layer (Subcutaneous Tissue) exposed. There is no  tunneling or undermining noted. There is a medium amount of serous drainage noted. The wound margin is distinct with the outline attached to the wound base. There is large (67-100%) red granulation within the wound bed. There is no necrotic tissue within the wound bed. Wound #4 status is Open. Original cause of wound was Other Lesion. The date acquired was: 07/22/2020. The wound is located on the Right Groin. The wound measures 0.4cm length x 9cm width x 0.1cm depth; 2.827cm^2 area and 0.283cm^3 volume. There is Fat Layer (Subcutaneous Tissue) exposed. There is no tunneling or undermining noted. There is a medium amount of serous drainage noted. The wound margin is distinct with the outline attached to the wound base. There is large (67-100%) red granulation within the wound bed. There is a small (1-33%) amount of necrotic tissue within the wound bed including Adherent Slough. Wound #5 status is Open. Original cause of wound was Other Lesion. The date acquired was: 07/22/2020. The wound is located on the Left Groin. The wound measures 0.4cm length x 7.8cm width x 0.1cm depth; 2.45cm^2 area and 0.245cm^3 volume. There is Fat Layer (Subcutaneous Tissue) exposed. There is no tunneling or undermining noted. There is a medium amount of serous drainage noted. The wound margin is distinct with the outline attached to the wound base. There is large (67-100%) red granulation within the wound bed. There is a small (1-33%) amount of necrotic tissue within the wound bed including Adherent Slough. Wound #6 status is Open. Original cause of wound was Other Lesion. The date acquired was: 07/22/2020. The wound is located on the Abdomen - Lower Quadrant. The wound measures 0.3cm length x 1.7cm width x 0.1cm depth; 0.401cm^2 area and 0.04cm^3 volume. There is Fat Layer (Subcutaneous Tissue) exposed. There is no tunneling noted. There is a medium amount of serous drainage noted. The wound margin is distinct with the  outline attached to the wound  base. There is large (67-100%) red, pink granulation within the wound bed. There is a small (1-33%) amount of necrotic tissue within the wound bed including Adherent Slough. Assessment Active Problems ICD-10 Pressure ulcer of left buttock, stage 2 Unspecified open wound of abdominal wall, unspecified quadrant without penetration into peritoneal cavity, sequela Multiple sclerosis Pressure ulcer of right buttock, stage 2 Erythema intertrigo Plan Follow-up Appointments: Return appointment in 3 weeks. Bathing/ Shower/ Hygiene: May shower with protection but do not get wound dressing(s) wet. - You may shower and use soap and water on the days that you change dressings. No bathing or soaking. Off-Loading: Turn and reposition every 2 hours Other: - Do not use donut cushion, use a gel cushion. Additional Orders / Instructions: Wound #3 Right Gluteal fold: Other: - Patient to completely dry with towels and air dry the skin folds. Patient to use hand towels in skin fold of abdomen and between leg and groin. Change underwear and hand towels frequently. Patient to check into purchasing interdry and use in place of hand towels. ***apply antifungal powder to skin folds daily. Pick up from pharmacy along with oral antifungal medication.*** Wound #4 Right Groin: Other: - Patient to completely dry with towels and air dry the skin folds. Patient to use hand towels in skin fold of abdomen and between leg and groin. Change underwear and hand towels frequently. Patient to check into purchasing interdry and use in place of hand towels. ***apply antifungal powder to skin folds daily. Pick up from pharmacy along with oral antifungal medication.*** Wound #5 Left Groin: Other: - Patient to completely dry with towels and air dry the skin folds. Patient to use hand towels in skin fold of abdomen and between leg and groin. Change underwear and hand towels frequently. Patient to check  into purchasing interdry and use in place of hand towels. ***apply antifungal powder to skin folds daily. Pick up from pharmacy along with oral antifungal medication.*** Wound #6 Abdomen - Lower Quadrant: Other: - Patient to completely dry with towels and air dry the skin folds. Patient to use hand towels in skin fold of abdomen and between leg and groin. Change underwear and hand towels frequently. Patient to check into purchasing interdry and use in place of hand towels. ***apply antifungal powder to skin folds daily. Pick up from pharmacy along with oral antifungal medication.*** The following medication(s) was prescribed: terbinafine HCl oral 250 mg tablet 1 tablet oral qd for 2 weeks for intertirgo starting 07/29/2020 nystatin topical 100,000 unit/gram powder powder topical to affected area bid starting 07/29/2020 WOUND #3: - Gluteal fold Wound Laterality: Right Cleanser: Soap and Water Every Other Day/ Discharge Instructions: May shower and wash wound with dial antibacterial soap and water prior to dressing change. Peri-Wound Care: Nystop Powder (Nystatin) Every Other Day/ Discharge Instructions: Apply Nystop (Nystatin) Powder as instructed Prim Dressing: KerraCel Ag Gelling Fiber Dressing, 4x5 in (silver alginate) Every Other Day/ ary Discharge Instructions: Apply silver alginate to wound bed as instructed Secondary Dressing: ABD Pad, 5x9 Every Other Day/ Discharge Instructions: Apply over primary dressing as directed. WOUND #4: - Groin Wound Laterality: Right Cleanser: Soap and Water Every Other Day/ Discharge Instructions: May shower and wash wound with dial antibacterial soap and water prior to dressing change. Peri-Wound Care: Nystop Powder (Nystatin) Every Other Day/ Discharge Instructions: Apply Nystop (Nystatin) Powder as instructed Prim Dressing: KerraCel Ag Gelling Fiber Dressing, 4x5 in (silver alginate) Every Other Day/ ary Discharge Instructions: Apply silver alginate to  wound bed as  instructed Secondary Dressing: ABD Pad, 5x9 Every Other Day/ Discharge Instructions: Apply over primary dressing as directed. WOUND #5: - Groin Wound Laterality: Left Cleanser: Soap and Water Every Other Day/ Discharge Instructions: May shower and wash wound with dial antibacterial soap and water prior to dressing change. Peri-Wound Care: Nystop Powder (Nystatin) Every Other Day/ Discharge Instructions: Apply Nystop (Nystatin) Powder as instructed Prim Dressing: KerraCel Ag Gelling Fiber Dressing, 4x5 in (silver alginate) Every Other Day/ ary Discharge Instructions: Apply silver alginate to wound bed as instructed Secondary Dressing: ABD Pad, 5x9 Every Other Day/ Discharge Instructions: Apply over primary dressing as directed. WOUND #6: - Abdomen - Lower Quadrant Wound Laterality: Cleanser: Soap and Water Every Other Day/ Discharge Instructions: May shower and wash wound with dial antibacterial soap and water prior to dressing change. Peri-Wound Care: Nystop Powder (Nystatin) Every Other Day/ Discharge Instructions: Apply Nystop (Nystatin) Powder as instructed Prim Dressing: KerraCel Ag Gelling Fiber Dressing, 4x5 in (silver alginate) Every Other Day/ ary Discharge Instructions: Apply silver alginate to wound bed as instructed Secondary Dressing: ABD Pad, 5x9 Every Other Day/ Discharge Instructions: Apply over primary dressing as directed. 1. Oral terbinafine 250 mg a day and nystatin powder to the area twice a day 2. I could not use Diflucan because of a drug interaction with Lexapro [increased QT interval] 3. She is also on trazodone and amitriptyline. I have asked her to hold the amitriptyline while she is taking the terbinafine which will be daily for 2 weeks 4. I have also asked her about some other ways of dealing with her incontinence other than plastic briefs. These cause sweating irritation 5. Counseled her to separate her folds with folded hand towels. She cannot  afford Interdry 6. Silver alginate to the open wounds which include several on the pannus fold on the upper lower abdomen and the areas on her posterior gluteal fold. Whether she can do this herself I have no idea. She lives with someone but they are not at a point where that person can help her Electronic Signature(s) Signed: 07/29/2020 5:43:02 PM By: Linton Ham MD Entered By: Linton Ham on 07/29/2020 17:36:46 -------------------------------------------------------------------------------- SuperBill Details Patient Name: Date of Service: [name] Williams, [place] D. 07/29/2020 Medical Record Number: [id number] Patient Account Number: [id number] Date of Birth/Sex: Treating RN: Sep 21, 1975 (45 y.o. Helene Shoe, Tammi Klippel Primary Care Provider: [name] Other Clinician: Referring Provider: Treating Provider/Extender: Neysa Hotter in Treatment: 6 Diagnosis Coding ICD-10 Codes Code Description [phone] Pressure ulcer of left buttock, stage 2 S31.109S Unspecified open wound of abdominal wall, unspecified quadrant without penetration into peritoneal cavity, sequela G35 Multiple sclerosis L89.312 Pressure ulcer of right buttock, stage 2 L30.4 Erythema intertrigo Facility Procedures Physician Procedures : CPT4 Code Description Modifier [id number] - WC PHYS LEVEL 4 - EST PT ICD-10 Diagnosis Description L89.322 Pressure ulcer of left buttock, stage 2 S31.109S Unspecified open wound of abdominal wall, unspecified quadrant without penetration into  peritoneal c L30.4 Erythema intertrigo G35 Multiple sclerosis Quantity: 1 avity, sequela Electronic Signature(s) Signed: 07/29/2020 5:43:02 PM By: Linton Ham MD Entered By: Linton Ham on 07/29/2020 17:37:17

## 2020-07-29 NOTE — Progress Notes (Signed)
Williams, Williams ([id number]) Visit Report for 07/29/2020 Arrival Information Details Patient Name: Date of Service: Williams Williams 07/29/2020 3:15 PM Medical Record Number: [id number] Patient Account Number: [id number] Date of Birth/Sex: Treating RN: 25-Jul-1975 (45 y.o. Helene Shoe, Meta.Reding Primary Care Careena Degraffenreid: [name] Other Clinician: Referring Jasmaine Rochel: Treating Ahnesty Finfrock/Extender: Neysa Hotter in Treatment: 6 Visit Information History Since Last Visit Added or deleted any medications: No Patient Arrived: Williams Williams Any new allergies or adverse reactions: No Arrival Time: 15:38 Had a fall or experienced change in No Accompanied By: self activities of daily living that may affect Transfer Assistance: None risk of falls: Patient Identification Verified: Yes Signs or symptoms of abuse/neglect since last visito No Secondary Verification Process Completed: Yes Hospitalized since last visit: No Patient Requires Transmission-Based Precautions: No Implantable device outside of the clinic excluding No Patient Has Alerts: No cellular tissue based products placed in the center since last visit: Has Dressing in Place as Prescribed: Yes Pain Present Now: Yes Electronic Signature(s) Signed: 07/29/2020 5:28:26 PM By: Sandre Kitty Entered By: Sandre Kitty on 07/29/2020 15:39:00 -------------------------------------------------------------------------------- Clinic Level of Care Assessment Details Patient Name: Date of Service: Williams Williams 07/29/2020 3:15 PM Medical Record Number: [id number] Patient Account Number: [id number] Date of Birth/Sex: Treating RN: Mar 14, 1976 (45 y.o. Helene Shoe, Tammi Klippel Primary Care Gordy Goar: [name] Other Clinician: Referring Dhanvin Szeto: Treating Giovani Neumeister/Extender: Neysa Hotter in Treatment: 6 Clinic Level of Care Assessment Items TOOL 4 Quantity Score X- 1 0 Use when only an EandM is  performed on FOLLOW-UP visit ASSESSMENTS - Nursing Assessment / Reassessment X- 1 10 Reassessment of Co-morbidities (includes updates in patient status) X- 1 5 Reassessment of Adherence to Treatment Plan ASSESSMENTS - Wound and Skin A ssessment / Reassessment []  - 0 Simple Wound Assessment / Reassessment - one wound X- 4 5 Complex Wound Assessment / Reassessment - multiple wounds X- 1 10 Dermatologic / Skin Assessment (not related to wound area) ASSESSMENTS - Focused Assessment []  - 0 Circumferential Edema Measurements - multi extremities X- 1 10 Nutritional Assessment / Counseling / Intervention []  - 0 Lower Extremity Assessment (monofilament, tuning fork, pulses) []  - 0 Peripheral Arterial Disease Assessment (using hand held doppler) ASSESSMENTS - Ostomy and/or Continence Assessment and Care []  - 0 Incontinence Assessment and Management []  - 0 Ostomy Care Assessment and Management (repouching, etc.) PROCESS - Coordination of Care []  - 0 Simple Patient / Family Education for ongoing care X- 1 20 Complex (extensive) Patient / Family Education for ongoing care X- 1 10 Staff obtains Programmer, systems, Records, T Results / Process Orders est []  - 0 Staff telephones HHA, Nursing Homes / Clarify orders / etc []  - 0 Routine Transfer to another Facility (non-emergent condition) []  - 0 Routine Hospital Admission (non-emergent condition) []  - 0 New Admissions / Biomedical engineer / Ordering NPWT Apligraf, etc. , []  - 0 Emergency Hospital Admission (emergent condition) []  - 0 Simple Discharge Coordination X- 1 15 Complex (extensive) Discharge Coordination PROCESS - Special Needs []  - 0 Pediatric / Minor Patient Management []  - 0 Isolation Patient Management []  - 0 Hearing / Language / Visual special needs []  - 0 Assessment of Community assistance (transportation, D/C planning, etc.) []  - 0 Additional assistance / Altered mentation []  - 0 Support Surface(s) Assessment  (bed, cushion, seat, etc.) INTERVENTIONS - Wound Cleansing / Measurement []  - 0 Simple Wound Cleansing - one wound X- 4 5 Complex Wound Cleansing - multiple wounds X- 1 5 Wound Imaging (  photographs - any number of wounds) []  - 0 Wound Tracing (instead of photographs) []  - 0 Simple Wound Measurement - one wound X- 4 5 Complex Wound Measurement - multiple wounds INTERVENTIONS - Wound Dressings []  - 0 Small Wound Dressing one or multiple wounds []  - 0 Medium Wound Dressing one or multiple wounds X- 1 20 Large Wound Dressing one or multiple wounds X- 1 5 Application of Medications - topical []  - 0 Application of Medications - injection INTERVENTIONS - Miscellaneous []  - 0 External ear exam []  - 0 Specimen Collection (cultures, biopsies, blood, body fluids, etc.) []  - 0 Specimen(s) / Culture(s) sent or taken to Lab for analysis []  - 0 Patient Transfer (multiple staff / Civil Service fast streamer / Similar devices) []  - 0 Simple Staple / Suture removal (25 or less) []  - 0 Complex Staple / Suture removal (26 or more) []  - 0 Hypo / Hyperglycemic Management (close monitor of Blood Glucose) []  - 0 Ankle / Brachial Index (ABI) - do not check if billed separately X- 1 5 Vital Signs Has the patient been seen at the hospital within the last three years: Yes Total Score: 175 Level Of Care: New/Established - Level 5 Electronic Signature(s) Signed: 07/29/2020 5:47:39 PM By: Deon Pilling Entered By: Deon Pilling on 07/29/2020 17:05:27 -------------------------------------------------------------------------------- Encounter Discharge Information Details Patient Name: Date of Service: Williams Williams, Williams D. 07/29/2020 3:15 PM Medical Record Number: [id number] Patient Account Number: [id number] Date of Birth/Sex: Treating RN: 1975-11-02 (45 y.o. Elam Dutch Primary Care Pablo Stauffer: [name] Other Clinician: Referring Lexany Belknap: Treating Tristan Bramble/Extender: Neysa Hotter in Treatment: 6 Encounter Discharge Information Items Discharge Condition: Stable Ambulatory Status: Walker Discharge Destination: Home Transportation: Private Auto Accompanied By: self Schedule Follow-up Appointment: Yes Clinical Summary of Care: Patient Declined Electronic Signature(s) Signed: 07/29/2020 5:43:47 PM By: Baruch Gouty RN, BSN Entered By: Baruch Gouty on 07/29/2020 17:01:56 -------------------------------------------------------------------------------- Lower Extremity Assessment Details Patient Name: Date of Service: Williams Williams, Victorious D. 07/29/2020 3:15 PM Medical Record Number: [id number] Patient Account Number: [id number] Date of Birth/Sex: Treating RN: 07/22/1975 (45 y.o. Sue Lush Primary Care Eliakim Tendler: [name] Other Clinician: Referring Tyquisha Sharps: Treating Fatimata Talsma/Extender: Neysa Hotter in Treatment: 6 Electronic Signature(s) Signed: 07/29/2020 5:36:16 PM By: Lorrin Jackson Entered By: Lorrin Jackson on 07/29/2020 16:17:33 -------------------------------------------------------------------------------- Multi Wound Chart Details Patient Name: Date of Service: Williams Williams, Williams D. 07/29/2020 3:15 PM Medical Record Number: [id number] Patient Account Number: [id number] Date of Birth/Sex: Treating RN: 12-Jan-1976 (45 y.o. Helene Shoe, Tammi Klippel Primary Care Keeshawn Fakhouri: [name] Other Clinician: Referring Akeiba Axelson: Treating Orla Jolliff/Extender: Neysa Hotter in Treatment: 6 Vital Signs Height(in): 65 Pulse(bpm): 101 Weight(lbs): 200 Blood Pressure(mmHg): 110/74 Body Mass Index(BMI): 33 Temperature(F): 98.1 Respiratory Rate(breaths/min): 18 Photos: Right Gluteal fold Right Groin Left Groin Wound Location: Pressure Injury Other Lesion Other Lesion Wounding Event: Pressure Ulcer Fungal Fungal Primary Etiology: Anemia, Received Chemotherapy, Anemia, Received Chemotherapy, Anemia,  Received Chemotherapy, Comorbid History: Received Radiation Received Radiation Received Radiation 06/21/2020 07/22/2020 07/22/2020 Date Acquired: 5 0 0 Weeks of Treatment: Open Open Open Wound Status: 0.7x2x0.1 0.4x9x0.1 0.4x7.8x0.1 Measurements L x W x D (cm) 1.1 2.827 2.45 A (cm) : rea 0.11 0.283 0.245 Volume (cm) : -900.00% 0.00% 0.00% % Reduction in Area: -900.00% 0.00% 0.00% % Reduction in Volume: Category/Stage II Full Thickness Without Exposed Full Thickness Without Exposed Classification: Support Structures Support Structures Medium Medium Medium Exudate Amount: Serous Serous Serous Exudate Type: Williams Williams Williams Exudate Color: Distinct, outline attached Distinct,  outline attached Distinct, outline attached Wound Margin: Large (67-100%) Large (67-100%) Large (67-100%) Granulation Amount: Red Red Red Granulation Quality: None Present (0%) Small (1-33%) Small (1-33%) Necrotic Amount: Fat Layer (Subcutaneous Tissue): Yes Fat Layer (Subcutaneous Tissue): Yes Fat Layer (Subcutaneous Tissue): Yes Exposed Structures: Fascia: No Fascia: No Fascia: No Tendon: No Tendon: No Tendon: No Muscle: No Muscle: No Muscle: No Joint: No Joint: No Joint: No Bone: No Bone: No Bone: No None None None Epithelialization: Wound Number: 6 N/A N/A Photos: N/A N/A Abdomen - Lower Quadrant N/A N/A Wound Location: Other Lesion N/A N/A Wounding Event: Fungal N/A N/A Primary Etiology: Anemia, Received Chemotherapy, N/A N/A Comorbid History: Received Radiation 07/22/2020 N/A N/A Date Acquired: 0 N/A N/A Weeks of Treatment: Open N/A N/A Wound Status: 0.3x1.7x0.1 N/A N/A Measurements L x W x D (cm) 0.401 N/A N/A A (cm) : rea 0.04 N/A N/A Volume (cm) : 0.00% N/A N/A % Reduction in Area: 0.00% N/A N/A % Reduction in Volume: Full Thickness Without Exposed N/A N/A Classification: Support Structures Medium N/A N/A Exudate Amount: Serous N/A N/A Exudate  Type: Williams N/A N/A Exudate Color: Distinct, outline attached N/A N/A Wound Margin: Large (67-100%) N/A N/A Granulation Amount: Red, Pink N/A N/A Granulation Quality: Small (1-33%) N/A N/A Necrotic Amount: Fat Layer (Subcutaneous Tissue): Yes N/A N/A Exposed Structures: Fascia: No Tendon: No Muscle: No Joint: No Bone: No None N/A N/A Epithelialization: Treatment Notes Wound #3 (Gluteal fold) Wound Laterality: Right Cleanser Soap and Water Discharge Instruction: May shower and wash wound with dial antibacterial soap and water prior to dressing change. Peri-Wound Care Nystop Powder (Nystatin) Discharge Instruction: Apply Nystop (Nystatin) Powder as instructed Topical Primary Dressing KerraCel Ag Gelling Fiber Dressing, 4x5 in (silver alginate) Discharge Instruction: Apply silver alginate to wound bed as instructed Secondary Dressing ABD Pad, 5x9 Discharge Instruction: Apply over primary dressing as directed. Secured With Compression Wrap Compression Stockings Add-Ons Wound #4 (Groin) Wound Laterality: Right Cleanser Soap and Water Discharge Instruction: May shower and wash wound with dial antibacterial soap and water prior to dressing change. Peri-Wound Care Nystop Powder (Nystatin) Discharge Instruction: Apply Nystop (Nystatin) Powder as instructed Topical Primary Dressing KerraCel Ag Gelling Fiber Dressing, 4x5 in (silver alginate) Discharge Instruction: Apply silver alginate to wound bed as instructed Secondary Dressing ABD Pad, 5x9 Discharge Instruction: Apply over primary dressing as directed. Secured With Compression Wrap Compression Stockings Add-Ons Wound #5 (Groin) Wound Laterality: Left Cleanser Soap and Water Discharge Instruction: May shower and wash wound with dial antibacterial soap and water prior to dressing change. Peri-Wound Care Nystop Powder (Nystatin) Discharge Instruction: Apply Nystop (Nystatin) Powder as instructed Topical Primary  Dressing KerraCel Ag Gelling Fiber Dressing, 4x5 in (silver alginate) Discharge Instruction: Apply silver alginate to wound bed as instructed Secondary Dressing ABD Pad, 5x9 Discharge Instruction: Apply over primary dressing as directed. Secured With Compression Wrap Compression Stockings Add-Ons Wound #6 (Abdomen - Lower Quadrant) Cleanser Soap and Water Discharge Instruction: May shower and wash wound with dial antibacterial soap and water prior to dressing change. Peri-Wound Care Nystop Powder (Nystatin) Discharge Instruction: Apply Nystop (Nystatin) Powder as instructed Topical Primary Dressing KerraCel Ag Gelling Fiber Dressing, 4x5 in (silver alginate) Discharge Instruction: Apply silver alginate to wound bed as instructed Secondary Dressing ABD Pad, 5x9 Discharge Instruction: Apply over primary dressing as directed. Secured With Compression Wrap Compression Stockings Environmental education officer) Signed: 07/29/2020 5:43:02 PM By: Linton Ham MD Signed: 07/29/2020 5:47:39 PM By: Deon Pilling Entered By: Linton Ham on 07/29/2020 17:24:27 -------------------------------------------------------------------------------- Multi-Disciplinary  Care Plan Details Patient Name: Date of Service: SHANAIA, SIEVERS 07/29/2020 3:15 PM Medical Record Number: [id number] Patient Account Number: [id number] Date of Birth/Sex: Treating RN: 09/01/1975 (45 y.o. Helene Shoe, Tammi Klippel Primary Care Latarra Eagleton: [name] Other Clinician: Referring Shanna Strength: Treating Toshi Ishii/Extender: Neysa Hotter in Treatment: 6 Active Inactive Pain, Acute or Chronic Nursing Diagnoses: Pain, acute or chronic: actual or potential Potential alteration in comfort, pain Goals: Patient will verbalize adequate pain control and receive pain control interventions during procedures as needed Date Initiated: 07/29/2020 Target Resolution Date: 08/06/2020 Goal Status:  Active Interventions: Encourage patient to take pain medications as prescribed Provide education on pain management Reposition patient for comfort Notes: Electronic Signature(s) Signed: 07/29/2020 5:47:39 PM By: Deon Pilling Entered By: Deon Pilling on 07/29/2020 17:04:41 -------------------------------------------------------------------------------- Pain Assessment Details Patient Name: Date of Service: Williams Williams, Samanta D. 07/29/2020 3:15 PM Medical Record Number: [id number] Patient Account Number: [id number] Date of Birth/Sex: Treating RN: May 22, 1975 (45 y.o. [name] Primary Care Oaklee Esther: [name] Other Clinician: Referring Molina Hollenback: Treating Amea Mcphail/Extender: Neysa Hotter in Treatment: 6 Active Problems Location of Pain Severity and Description of Pain Patient Has Paino Yes Site Locations Rate the pain. Current Pain Level: 10 Pain Management and Medication Current Pain Management: Electronic Signature(s) Signed: 07/29/2020 5:28:26 PM By: Sandre Kitty Signed: 07/29/2020 5:47:39 PM By: Deon Pilling Entered By: Sandre Kitty on 07/29/2020 15:39:26 -------------------------------------------------------------------------------- Patient/Caregiver Education Details Patient Name: Date of Service: Williams Williams, Rojelio Brenner D. 3/31/2022andnbsp3:15 PM Medical Record Number: [id number] Patient Account Number: [id number] Date of Birth/Gender: Treating RN: 1975/10/09 (45 y.o. [name] Primary Care Physician: [name] Other Clinician: Referring Physician: Treating Physician/Extender: Neysa Hotter in Treatment: 6 Education Assessment Education Provided To: Patient Education Topics Provided Pain: Handouts: A Guide to Pain Control Methods: Explain/Verbal Responses: Reinforcements needed Electronic Signature(s) Signed: 07/29/2020 5:47:39 PM By: Deon Pilling Entered By: Deon Pilling on 07/29/2020  17:04:52 -------------------------------------------------------------------------------- Wound Assessment Details Patient Name: Date of Service: Williams Williams, Appolonia D. 07/29/2020 3:15 PM Medical Record Number: [id number] Patient Account Number: [id number] Date of Birth/Sex: Treating RN: [birth year] (45 y.o. Helene Shoe, Tammi Klippel Primary Care Abegail Kloeppel: [name] Other Clinician: Referring Chiyoko Torrico: Treating Roma Bondar/Extender: Neysa Hotter in Treatment: 6 Wound Status Wound Number: 3 Primary Etiology: Pressure Ulcer Wound Location: Right Gluteal fold Wound Status: Open Wounding Event: Pressure Injury Comorbid History: Anemia, Received Chemotherapy, Received Radiation Date Acquired: 06/21/2020 Weeks Of Treatment: 5 Clustered Wound: No Photos Wound Measurements Length: (cm) 0.7 Width: (cm) 2 Depth: (cm) 0.1 Area: (cm) 1.1 Volume: (cm) 0.11 % Reduction in Area: -900% % Reduction in Volume: -900% Epithelialization: None Tunneling: No Undermining: No Wound Description Classification: Category/Stage II Wound Margin: Distinct, outline attached Exudate Amount: Medium Exudate Type: Serous Exudate Color: Williams Foul Odor After Cleansing: No Slough/Fibrino No Wound Bed Granulation Amount: Large (67-100%) Exposed Structure Granulation Quality: Red Fascia Exposed: No Necrotic Amount: None Present (0%) Fat Layer (Subcutaneous Tissue) Exposed: Yes Tendon Exposed: No Muscle Exposed: No Joint Exposed: No Bone Exposed: No Treatment Notes Wound #3 (Gluteal fold) Wound Laterality: Right Cleanser Soap and Water Discharge Instruction: May shower and wash wound with dial antibacterial soap and water prior to dressing change. Peri-Wound Care Nystop Powder (Nystatin) Discharge Instruction: Apply Nystop (Nystatin) Powder as instructed Topical Primary Dressing KerraCel Ag Gelling Fiber Dressing, 4x5 in (silver alginate) Discharge Instruction: Apply silver  alginate to wound bed as instructed Secondary Dressing ABD Pad, 5x9 Discharge Instruction: Apply over primary dressing as  directed. Secured With Compression Wrap Compression Stockings Environmental education officer) Signed: 07/29/2020 5:28:26 PM By: Sandre Kitty Signed: 07/29/2020 5:47:39 PM By: Deon Pilling Entered By: Sandre Kitty on 07/29/2020 17:23:16 -------------------------------------------------------------------------------- Wound Assessment Details Patient Name: Date of Service: Williams Williams, Williams D. 07/29/2020 3:15 PM Medical Record Number: [id number] Patient Account Number: [id number] Date of Birth/Sex: Treating RN: 08/20/1975 (45 y.o. Helene Shoe, Tammi Klippel Primary Care Atiba Kimberlin: [name] Other Clinician: Referring Landri Dorsainvil: Treating Law Corsino/Extender: Neysa Hotter in Treatment: 6 Wound Status Wound Number: 4 Primary Etiology: Fungal Wound Location: Right Groin Wound Status: Open Wounding Event: Other Lesion Comorbid History: Anemia, Received Chemotherapy, Received Radiation Date Acquired: 07/22/2020 Weeks Of Treatment: 0 Clustered Wound: No Photos Wound Measurements Length: (cm) 0.4 Width: (cm) 9 Depth: (cm) 0.1 Area: (cm) 2.827 Volume: (cm) 0.283 % Reduction in Area: 0% % Reduction in Volume: 0% Epithelialization: None Tunneling: No Undermining: No Wound Description Classification: Full Thickness Without Exposed Support Structures Wound Margin: Distinct, outline attached Exudate Amount: Medium Exudate Type: Serous Exudate Color: Williams Foul Odor After Cleansing: No Slough/Fibrino Yes Wound Bed Granulation Amount: Large (67-100%) Exposed Structure Granulation Quality: Red Fascia Exposed: No Necrotic Amount: Small (1-33%) Fat Layer (Subcutaneous Tissue) Exposed: Yes Necrotic Quality: Adherent Slough Tendon Exposed: No Muscle Exposed: No Joint Exposed: No Bone Exposed: No Treatment Notes Wound #4 (Groin)  Wound Laterality: Right Cleanser Soap and Water Discharge Instruction: May shower and wash wound with dial antibacterial soap and water prior to dressing change. Peri-Wound Care Nystop Powder (Nystatin) Discharge Instruction: Apply Nystop (Nystatin) Powder as instructed Topical Primary Dressing KerraCel Ag Gelling Fiber Dressing, 4x5 in (silver alginate) Discharge Instruction: Apply silver alginate to wound bed as instructed Secondary Dressing ABD Pad, 5x9 Discharge Instruction: Apply over primary dressing as directed. Secured With Compression Wrap Compression Stockings Environmental education officer) Signed: 07/29/2020 5:28:26 PM By: Sandre Kitty Signed: 07/29/2020 5:47:39 PM By: Deon Pilling Entered By: Sandre Kitty on 07/29/2020 17:21:58 -------------------------------------------------------------------------------- Wound Assessment Details Patient Name: Date of Service: Williams Williams, Williams D. 07/29/2020 3:15 PM Medical Record Number: [id number] Patient Account Number: [id number] Date of Birth/Sex: Treating RN: 1976-01-30 (45 y.o. Helene Shoe, Tammi Klippel Primary Care Tarahji Ramthun: [name] Other Clinician: Referring Cleatus Gabriel: Treating Aeric Burnham/Extender: Neysa Hotter in Treatment: 6 Wound Status Wound Number: 5 Primary Etiology: Fungal Wound Location: Left Groin Wound Status: Open Wounding Event: Other Lesion Comorbid History: Anemia, Received Chemotherapy, Received Radiation Date Acquired: 07/22/2020 Weeks Of Treatment: 0 Clustered Wound: No Photos Wound Measurements Length: (cm) 0.4 Width: (cm) 7.8 Depth: (cm) 0.1 Area: (cm) 2.45 Volume: (cm) 0.245 % Reduction in Area: 0% % Reduction in Volume: 0% Epithelialization: None Tunneling: No Undermining: No Wound Description Classification: Full Thickness Without Exposed Support Structures Wound Margin: Distinct, outline attached Exudate Amount: Medium Exudate Type: Serous Exudate  Color: Williams Foul Odor After Cleansing: No Slough/Fibrino Yes Wound Bed Granulation Amount: Large (67-100%) Exposed Structure Granulation Quality: Red Fascia Exposed: No Necrotic Amount: Small (1-33%) Fat Layer (Subcutaneous Tissue) Exposed: Yes Necrotic Quality: Adherent Slough Tendon Exposed: No Muscle Exposed: No Joint Exposed: No Bone Exposed: No Treatment Notes Wound #5 (Groin) Wound Laterality: Left Cleanser Soap and Water Discharge Instruction: May shower and wash wound with dial antibacterial soap and water prior to dressing change. Peri-Wound Care Nystop Powder (Nystatin) Discharge Instruction: Apply Nystop (Nystatin) Powder as instructed Topical Primary Dressing KerraCel Ag Gelling Fiber Dressing, 4x5 in (silver alginate) Discharge Instruction: Apply silver alginate to wound bed as instructed Secondary Dressing ABD Pad, 5x9 Discharge Instruction: Apply  over primary dressing as directed. Secured With Compression Wrap Compression Stockings Environmental education officer) Signed: 07/29/2020 5:28:26 PM By: Sandre Kitty Signed: 07/29/2020 5:47:39 PM By: Deon Pilling Entered By: Sandre Kitty on 07/29/2020 17:22:26 -------------------------------------------------------------------------------- Wound Assessment Details Patient Name: Date of Service: Williams Williams, Williams D. 07/29/2020 3:15 PM Medical Record Number: [id number] Patient Account Number: [id number] Date of Birth/Sex: Treating RN: 14-Jul-1975 (45 y.o. Helene Shoe, Tammi Klippel Primary Care Marleni Gallardo: [name] Other Clinician: Referring Rosali Augello: Treating Latesha Chesney/Extender: Neysa Hotter in Treatment: 6 Wound Status Wound Number: 6 Primary Etiology: Fungal Wound Location: Abdomen - Lower Quadrant Wound Status: Open Wounding Event: Other Lesion Comorbid History: Anemia, Received Chemotherapy, Received Radiation Date Acquired: 07/22/2020 Weeks Of Treatment: 0 Clustered Wound:  No Photos Wound Measurements Length: (cm) 0.3 Width: (cm) 1.7 Depth: (cm) 0.1 Area: (cm) 0.401 Volume: (cm) 0.04 % Reduction in Area: 0% % Reduction in Volume: 0% Epithelialization: None Tunneling: No Wound Description Classification: Full Thickness Without Exposed Support Structures Wound Margin: Distinct, outline attached Exudate Amount: Medium Exudate Type: Serous Exudate Color: Williams Foul Odor After Cleansing: No Slough/Fibrino Yes Wound Bed Granulation Amount: Large (67-100%) Exposed Structure Granulation Quality: Red, Pink Fascia Exposed: No Necrotic Amount: Small (1-33%) Fat Layer (Subcutaneous Tissue) Exposed: Yes Necrotic Quality: Adherent Slough Tendon Exposed: No Muscle Exposed: No Joint Exposed: No Bone Exposed: No Treatment Notes Wound #6 (Abdomen - Lower Quadrant) Cleanser Soap and Water Discharge Instruction: May shower and wash wound with dial antibacterial soap and water prior to dressing change. Peri-Wound Care Nystop Powder (Nystatin) Discharge Instruction: Apply Nystop (Nystatin) Powder as instructed Topical Primary Dressing KerraCel Ag Gelling Fiber Dressing, 4x5 in (silver alginate) Discharge Instruction: Apply silver alginate to wound bed as instructed Secondary Dressing ABD Pad, 5x9 Discharge Instruction: Apply over primary dressing as directed. Secured With Compression Wrap Compression Stockings Environmental education officer) Signed: 07/29/2020 5:28:26 PM By: Sandre Kitty Signed: 07/29/2020 5:47:39 PM By: Deon Pilling Entered By: Sandre Kitty on 07/29/2020 17:22:49 -------------------------------------------------------------------------------- Vitals Details Patient Name: Date of Service: Williams Williams D. 07/29/2020 3:15 PM Medical Record Number: [id number] Patient Account Number: [id number] Date of Birth/Sex: Treating RN: January 30, 1976 (45 y.o. Helene Shoe, Tammi Klippel Primary Care Timoth Schara: [name] Other  Clinician: Referring Sayvion Vigen: Treating Milisa Kimbell/Extender: Neysa Hotter in Treatment: 6 Vital Signs Time Taken: 15:38 Temperature (F): 98.1 Height (in): 65 Pulse (bpm): 101 Weight (lbs): 200 Respiratory Rate (breaths/min): 18 Body Mass Index (BMI): 33.3 Blood Pressure (mmHg): 110/74 Reference Range: 80 - 120 mg / dl Electronic Signature(s) Signed: 07/29/2020 5:28:26 PM By: Sandre Kitty Entered By: Sandre Kitty on 07/29/2020 15:39:15

## 2020-08-02 ENCOUNTER — Inpatient Hospital Stay (HOSPITAL_COMMUNITY)
Admission: EM | Admit: 2020-08-02 | Discharge: 2020-08-06 | DRG: 603 | Disposition: A | Discharge status: Home Health | Payer: Medicare HMO | Attending: Internal Medicine | Admitting: Internal Medicine

## 2020-08-02 ENCOUNTER — Emergency Department (HOSPITAL_COMMUNITY): Payer: Medicare HMO

## 2020-08-02 ENCOUNTER — Other Ambulatory Visit: Payer: Self-pay | Admitting: Family Medicine

## 2020-08-02 ENCOUNTER — Other Ambulatory Visit: Payer: Self-pay

## 2020-08-02 DIAGNOSIS — Z9221 Personal history of antineoplastic chemotherapy: Secondary | ICD-10-CM

## 2020-08-02 DIAGNOSIS — Z20822 Contact with and (suspected) exposure to covid-19: Secondary | ICD-10-CM | POA: Diagnosis not present

## 2020-08-02 DIAGNOSIS — R000 Tachycardia, unspecified: Secondary | ICD-10-CM | POA: Diagnosis not present

## 2020-08-02 DIAGNOSIS — G4701 Insomnia due to medical condition: Secondary | ICD-10-CM

## 2020-08-02 DIAGNOSIS — F32A Depression, unspecified: Secondary | ICD-10-CM | POA: Diagnosis present

## 2020-08-02 DIAGNOSIS — L03311 Cellulitis of abdominal wall: Secondary | ICD-10-CM | POA: Diagnosis not present

## 2020-08-02 DIAGNOSIS — E871 Hypo-osmolality and hyponatremia: Secondary | ICD-10-CM | POA: Diagnosis not present

## 2020-08-02 DIAGNOSIS — Z85048 Personal history of other malignant neoplasm of rectum, rectosigmoid junction, and anus: Secondary | ICD-10-CM | POA: Diagnosis not present

## 2020-08-02 DIAGNOSIS — R69 Illness, unspecified: Secondary | ICD-10-CM | POA: Diagnosis not present

## 2020-08-02 DIAGNOSIS — E039 Hypothyroidism, unspecified: Secondary | ICD-10-CM | POA: Diagnosis present

## 2020-08-02 DIAGNOSIS — Z91048 Other nonmedicinal substance allergy status: Secondary | ICD-10-CM | POA: Diagnosis not present

## 2020-08-02 DIAGNOSIS — Z7982 Long term (current) use of aspirin: Secondary | ICD-10-CM

## 2020-08-02 DIAGNOSIS — R109 Unspecified abdominal pain: Secondary | ICD-10-CM | POA: Diagnosis not present

## 2020-08-02 DIAGNOSIS — E785 Hyperlipidemia, unspecified: Secondary | ICD-10-CM | POA: Diagnosis present

## 2020-08-02 DIAGNOSIS — I10 Essential (primary) hypertension: Secondary | ICD-10-CM | POA: Diagnosis not present

## 2020-08-02 DIAGNOSIS — Z923 Personal history of irradiation: Secondary | ICD-10-CM | POA: Diagnosis not present

## 2020-08-02 DIAGNOSIS — Z8249 Family history of ischemic heart disease and other diseases of the circulatory system: Secondary | ICD-10-CM | POA: Diagnosis not present

## 2020-08-02 DIAGNOSIS — G35 Multiple sclerosis: Secondary | ICD-10-CM | POA: Diagnosis not present

## 2020-08-02 DIAGNOSIS — R21 Rash and other nonspecific skin eruption: Secondary | ICD-10-CM | POA: Diagnosis not present

## 2020-08-02 DIAGNOSIS — Z888 Allergy status to other drugs, medicaments and biological substances status: Secondary | ICD-10-CM

## 2020-08-02 DIAGNOSIS — N319 Neuromuscular dysfunction of bladder, unspecified: Secondary | ICD-10-CM

## 2020-08-02 DIAGNOSIS — Z87891 Personal history of nicotine dependence: Secondary | ICD-10-CM

## 2020-08-02 DIAGNOSIS — Z86718 Personal history of other venous thrombosis and embolism: Secondary | ICD-10-CM

## 2020-08-02 DIAGNOSIS — B372 Candidiasis of skin and nail: Secondary | ICD-10-CM | POA: Diagnosis not present

## 2020-08-02 DIAGNOSIS — F4310 Post-traumatic stress disorder, unspecified: Secondary | ICD-10-CM | POA: Diagnosis present

## 2020-08-02 DIAGNOSIS — Z79899 Other long term (current) drug therapy: Secondary | ICD-10-CM

## 2020-08-02 DIAGNOSIS — K219 Gastro-esophageal reflux disease without esophagitis: Secondary | ICD-10-CM | POA: Diagnosis not present

## 2020-08-02 DIAGNOSIS — R059 Cough, unspecified: Secondary | ICD-10-CM | POA: Diagnosis not present

## 2020-08-02 LAB — LACTIC ACID, PLASMA: Lactic Acid, Venous: 1.1 mmol/L (ref 0.5–1.9)

## 2020-08-02 LAB — CBC WITH DIFFERENTIAL/PLATELET
Abs Immature Granulocytes: 0.04 10*3/uL (ref 0.00–0.07)
Basophils Absolute: 0 10*3/uL (ref 0.0–0.1)
Basophils Relative: 0 %
Eosinophils Absolute: 0.1 10*3/uL (ref 0.0–0.5)
Eosinophils Relative: 1 %
HCT: 39.7 % (ref 36.0–46.0)
Hemoglobin: 12.6 g/dL (ref 12.0–15.0)
Immature Granulocytes: 1 %
Lymphocytes Relative: 5 %
Lymphs Abs: 0.4 10*3/uL — ABNORMAL LOW (ref 0.7–4.0)
MCH: 32.8 pg (ref 26.0–34.0)
MCHC: 31.7 g/dL (ref 30.0–36.0)
MCV: 103.4 fL — ABNORMAL HIGH (ref 80.0–100.0)
Monocytes Absolute: 0.7 10*3/uL (ref 0.1–1.0)
Monocytes Relative: 10 %
Neutro Abs: 5.5 10*3/uL (ref 1.7–7.7)
Neutrophils Relative %: 83 %
Platelets: 225 10*3/uL (ref 150–400)
RBC: 3.84 MIL/uL — ABNORMAL LOW (ref 3.87–5.11)
RDW: 13.5 % (ref 11.5–15.5)
WBC: 6.7 10*3/uL (ref 4.0–10.5)
nRBC: 0 % (ref 0.0–0.2)

## 2020-08-02 LAB — COMPREHENSIVE METABOLIC PANEL
ALT: 15 U/L (ref 0–44)
AST: 16 U/L (ref 15–41)
Albumin: 2.8 g/dL — ABNORMAL LOW (ref 3.5–5.0)
Alkaline Phosphatase: 59 U/L (ref 38–126)
Anion gap: 10 (ref 5–15)
BUN: 9 mg/dL (ref 6–20)
CO2: 21 mmol/L — ABNORMAL LOW (ref 22–32)
Calcium: 9.4 mg/dL (ref 8.9–10.3)
Chloride: 103 mmol/L (ref 98–111)
Creatinine, Ser: 0.85 mg/dL (ref 0.44–1.00)
GFR, Estimated: >60 mL/min (ref >60)
Glucose, Bld: 84 mg/dL (ref 70–99)
Potassium: 4.1 mmol/L (ref 3.5–5.1)
Sodium: 134 mmol/L — ABNORMAL LOW (ref 135–145)
Total Bilirubin: 0.5 mg/dL (ref 0.3–1.2)
Total Protein: 5.2 g/dL — ABNORMAL LOW (ref 6.5–8.1)

## 2020-08-02 MED ORDER — SODIUM CHLORIDE 0.9% FLUSH: 10.0000 mL | INTRAVENOUS | Status: DC | PRN

## 2020-08-02 MED ORDER — ENOXAPARIN SODIUM 40 MG/0.4ML ~~LOC~~ SOLN
40.0000 mg | SUBCUTANEOUS | Status: DC
  Administered 2020-08-03 – 2020-08-05 (×4): 40 mg via SUBCUTANEOUS
  Filled 2020-08-02 (×4): qty 0.4

## 2020-08-02 MED ORDER — IOHEXOL 300 MG/ML  SOLN
100.0000 mL | Freq: Once | INTRAMUSCULAR | Status: AC | PRN
  Administered 2020-08-02: 100 mL via INTRAVENOUS

## 2020-08-02 MED ORDER — SODIUM CHLORIDE 0.9% FLUSH
10.0000 mL | Freq: Two times a day (BID) | INTRAVENOUS | Status: DC
  Administered 2020-08-02 – 2020-08-06 (×6): 10 mL

## 2020-08-02 MED ORDER — SODIUM CHLORIDE 0.9 % IV SOLN
1.0000 g | Freq: Once | INTRAVENOUS | Status: AC
  Administered 2020-08-02: 1 g via INTRAVENOUS
  Filled 2020-08-02: qty 10

## 2020-08-02 MED ORDER — NYSTATIN 100000 UNIT/GM EX POWD
1.0000 "application " | Freq: Every day | CUTANEOUS | Status: DC
  Filled 2020-08-02: qty 15

## 2020-08-02 MED ORDER — LACTATED RINGERS IV BOLUS
1000.0000 mL | Freq: Once | INTRAVENOUS | Status: AC
  Administered 2020-08-02: 1000 mL via INTRAVENOUS

## 2020-08-02 MED ORDER — CHLORHEXIDINE GLUCONATE CLOTH 2 % EX PADS
6.0000 | MEDICATED_PAD | Freq: Every day | CUTANEOUS | Status: DC
  Administered 2020-08-03 – 2020-08-06 (×3): 6 via TOPICAL
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
## 2020-08-02 NOTE — ED Triage Notes (Signed)
Pt with hx of MS, has a fungal infection for which she is being treated by wound care x 1 month. However reports that it is spreading, now has areas across her abdomen, groin, breast, and legs. Reports dry cough as well.
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
## 2020-08-02 NOTE — ED Triage Notes (Signed)
Emergency Medicine Provider Triage Evaluation Note  Amber Williams , a 45 y.o. female  was evaluated in triage.  Pt complains of Spreading fungal infection.  She reports that she can't take care of her self and can't keep her wounds dry due to bladder issue.  She reports that she has been having a dry cough.  For the 5 days.  She has been vaccinated x2, no booster.  She had fevers up to 102.7, last was two days ago.   Review of Systems  Positive: Fevers, spreading fungal infection, rash Negative: Abdominal pain  Physical Exam  BP 113/63 (BP Location: Left Arm)   Pulse (!) 109   Temp 98.7 F (37.1 C) (Oral)   Resp 18   SpO2 100%  Gen:   Awake, no distress   HEENT:  Atraumatic  Resp:  Normal effort  Cardiac:  Normal tachycardic  Abd:   Nondistended, nontender  MSK:   Moves extremities without difficulty  Neuro:  Speech clear  Skin: Limited exam: lower abdomen with red macerated tissue   Medical Decision Making  Medically screening exam initiated at 12:50 PM.  Appropriate orders placed.  Amber Williams was informed that the remainder of the evaluation will be completed by another provider, this initial triage assessment does not replace that evaluation, and the importance of remaining in the ED until their evaluation is complete.  Clinical Impression  Skin wounds, cough   Lorin Glass, [place] 08/02/20 1254

## 2020-08-02 NOTE — ED Notes (Signed)
Introduced self to patient, denies pain, requesting permission to drink.
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
## 2020-08-03 ENCOUNTER — Encounter (HOSPITAL_COMMUNITY): Payer: Self-pay | Admitting: Internal Medicine

## 2020-08-03 DIAGNOSIS — G35 Multiple sclerosis: Secondary | ICD-10-CM

## 2020-08-03 DIAGNOSIS — L03311 Cellulitis of abdominal wall: Secondary | ICD-10-CM | POA: Diagnosis not present

## 2020-08-03 DIAGNOSIS — E039 Hypothyroidism, unspecified: Secondary | ICD-10-CM

## 2020-08-03 DIAGNOSIS — K219 Gastro-esophageal reflux disease without esophagitis: Secondary | ICD-10-CM

## 2020-08-03 DIAGNOSIS — B372 Candidiasis of skin and nail: Secondary | ICD-10-CM

## 2020-08-03 HISTORY — DX: Gastro-esophageal reflux disease without esophagitis: K21.9

## 2020-08-03 LAB — APTT: aPTT: 32 seconds (ref 24–36)

## 2020-08-03 LAB — SARS CORONAVIRUS 2 (TAT 6-24 HRS): SARS Coronavirus 2: NEGATIVE

## 2020-08-03 LAB — COMPREHENSIVE METABOLIC PANEL
ALT: 14 U/L (ref 0–44)
AST: 13 U/L — ABNORMAL LOW (ref 15–41)
Albumin: 2.7 g/dL — ABNORMAL LOW (ref 3.5–5.0)
Alkaline Phosphatase: 57 U/L (ref 38–126)
Anion gap: 5 (ref 5–15)
BUN: 7 mg/dL (ref 6–20)
CO2: 26 mmol/L (ref 22–32)
Calcium: 9.3 mg/dL (ref 8.9–10.3)
Chloride: 105 mmol/L (ref 98–111)
Creatinine, Ser: 0.89 mg/dL (ref 0.44–1.00)
GFR, Estimated: >60 mL/min (ref >60)
Glucose, Bld: 89 mg/dL (ref 70–99)
Potassium: 4 mmol/L (ref 3.5–5.1)
Sodium: 136 mmol/L (ref 135–145)
Total Bilirubin: 0.3 mg/dL (ref 0.3–1.2)
Total Protein: 4.9 g/dL — ABNORMAL LOW (ref 6.5–8.1)

## 2020-08-03 LAB — CBC
HCT: 37 % (ref 36.0–46.0)
Hemoglobin: 11.8 g/dL — ABNORMAL LOW (ref 12.0–15.0)
MCH: 32.2 pg (ref 26.0–34.0)
MCHC: 31.9 g/dL (ref 30.0–36.0)
MCV: 101.1 fL — ABNORMAL HIGH (ref 80.0–100.0)
Platelets: 278 10*3/uL (ref 150–400)
RBC: 3.66 MIL/uL — ABNORMAL LOW (ref 3.87–5.11)
RDW: 13.5 % (ref 11.5–15.5)
WBC: 6.6 10*3/uL (ref 4.0–10.5)
nRBC: 0 % (ref 0.0–0.2)

## 2020-08-03 LAB — PROTIME-INR
INR: 1.1 (ref 0.8–1.2)
Prothrombin Time: 13.7 seconds (ref 11.4–15.2)

## 2020-08-03 LAB — HIV ANTIBODY (ROUTINE TESTING W REFLEX): HIV Screen 4th Generation wRfx: NONREACTIVE

## 2020-08-03 LAB — PHOSPHORUS: Phosphorus: 2.1 mg/dL — ABNORMAL LOW (ref 2.5–4.6)

## 2020-08-03 LAB — MAGNESIUM: Magnesium: 1.9 mg/dL (ref 1.7–2.4)

## 2020-08-03 MED ORDER — K PHOS MONO-SOD PHOS DI & MONO 155-852-130 MG PO TABS
250.0000 mg | ORAL_TABLET | Freq: Three times a day (TID) | ORAL | Status: AC
  Administered 2020-08-03 – 2020-08-04 (×3): 250 mg via ORAL
  Filled 2020-08-03 (×4): qty 1

## 2020-08-03 MED ORDER — NYSTATIN 100000 UNIT/GM EX POWD
Freq: Two times a day (BID) | CUTANEOUS | Status: DC
  Filled 2020-08-03 (×3): qty 15

## 2020-08-03 MED ORDER — VANCOMYCIN HCL 10 G IV SOLR
1750.0000 mg | Freq: Once | INTRAVENOUS | Status: AC
  Administered 2020-08-03: 1750 mg via INTRAVENOUS
  Filled 2020-08-03 (×3): qty 1750

## 2020-08-03 MED ORDER — ESCITALOPRAM OXALATE 10 MG PO TABS
10.0000 mg | ORAL_TABLET | Freq: Every day | ORAL | Status: DC
  Administered 2020-08-03 – 2020-08-06 (×4): 10 mg via ORAL
  Filled 2020-08-03 (×4): qty 1

## 2020-08-03 MED ORDER — TRAZODONE HCL 150 MG PO TABS
150.0000 mg | ORAL_TABLET | Freq: Every day | ORAL | Status: DC
  Administered 2020-08-03 – 2020-08-05 (×3): 150 mg via ORAL
  Filled 2020-08-03 (×3): qty 1

## 2020-08-03 MED ORDER — PANTOPRAZOLE SODIUM 40 MG PO TBEC
40.0000 mg | DELAYED_RELEASE_TABLET | Freq: Every day | ORAL | Status: DC
  Administered 2020-08-03 – 2020-08-06 (×4): 40 mg via ORAL
  Filled 2020-08-03 (×4): qty 1

## 2020-08-03 MED ORDER — ASPIRIN EC 81 MG PO TBEC
81.0000 mg | DELAYED_RELEASE_TABLET | Freq: Every day | ORAL | Status: DC
  Administered 2020-08-03 – 2020-08-05 (×3): 81 mg via ORAL
  Filled 2020-08-03 (×3): qty 1

## 2020-08-03 MED ORDER — FLUCONAZOLE 100MG IVPB
100.0000 mg | INTRAVENOUS | Status: DC
  Administered 2020-08-03 – 2020-08-04 (×2): 100 mg via INTRAVENOUS
  Filled 2020-08-03 (×4): qty 50

## 2020-08-03 MED ORDER — ACETAMINOPHEN 325 MG PO TABS
650.0000 mg | ORAL_TABLET | Freq: Four times a day (QID) | ORAL | Status: DC | PRN
  Administered 2020-08-03: 650 mg via ORAL
  Filled 2020-08-03: qty 2

## 2020-08-03 MED ORDER — TEMAZEPAM 15 MG PO CAPS
30.0000 mg | ORAL_CAPSULE | Freq: Every evening | ORAL | Status: DC | PRN
  Administered 2020-08-03 – 2020-08-04 (×2): 30 mg via ORAL
  Filled 2020-08-03 (×2): qty 2

## 2020-08-03 MED ORDER — LEVOTHYROXINE SODIUM 25 MCG PO TABS
125.0000 ug | ORAL_TABLET | Freq: Every day | ORAL | Status: DC
  Administered 2020-08-04 – 2020-08-06 (×3): 125 ug via ORAL
  Filled 2020-08-03 (×4): qty 1

## 2020-08-03 MED ORDER — VANCOMYCIN HCL 1250 MG/250ML IV SOLN
1250.0000 mg | Freq: Two times a day (BID) | INTRAVENOUS | Status: DC
  Administered 2020-08-03 – 2020-08-04 (×3): 1250 mg via INTRAVENOUS
  Filled 2020-08-03 (×4): qty 250

## 2020-08-03 NOTE — Progress Notes (Signed)
Pharmacy Antibiotic Note  Amber Williams is a 45 y.o. female admitted on 08/02/2020 with cellulitis.  Pharmacy has been consulted for vancomycin dosing.  Plan: Vancomycin 1750 mg IV x 1 then 1250 mg IV q12 hours F/u renal function, cultures and clinical course  Height: 5\' 5"  (165.1 cm) Weight: 90.7 kg (200 lb) IBW/kg (Calculated) : 57  Temp (24hrs), Avg:98.4 F (36.9 C), Min:98.1 F (36.7 C), Max:98.7 F (37.1 C)  Recent Labs  Lab 08/02/20 1625 08/02/20 1715  WBC 6.7  --   CREATININE 0.85  --   LATICACIDVEN  --  1.1    Estimated Creatinine Clearance: 94 mL/min (by C-G formula based on SCr of 0.85 mg/dL).    Allergies  Allergen Reactions  . Other Hives    Plastic Tape  . Tecfidera [Dimethyl Fumarate] Itching    Thank you for allowing pharmacy to be a part of this patient's care.  Excell Seltzer Poteet 08/03/2020 1:49 AM

## 2020-08-03 NOTE — Consult Note (Signed)
WOC Nurse Consult Note: Patient receiving care in [place]. Primary RN, Jana Half, present at time of my assessment, as well as the patient's mother. Reason for Consult: multiple wounds on abdomen and buttocks Wound type: ICD-10 CM Irritant Dermatitis L24A2 - Due to fecal, urinary or dual incontinence  Severe MASD from constant moisture associated with urinary incontinence, inability to thoroughly cleanse body folds, immobility, obesity.  Has a fungal component also.  This is present in the abdominal fold, inner upper thighs, creases along the thighs extending posteriorly.  There is a patch of erythematous skin between the breasts somewhat consistent with this same type of skin condition.  HOWEVER, the patient has scattered red bumps along her arms.  I am uncertain if she has a concomitant undiagnosed dermatologic condition as well as the MASD. Pressure Injury POA: Yes/No/NA Measurement: Wound bed: red, moist, painful Drainage (amount, consistency, odor)  Periwound: Dressing procedure/placement/frequency: Estrella Deeds [phone]. DO NOT PLACE POWDERS, OINTMENTS, OR CREAMS where Phil Dopp is used. Using more than one pack, Place WIDE strips of InterDry into the abdominal and inguinal folds. The antifungal powder on the [facility] was changed to reflect using it in specific areas of the body and body folds, but NOT where the Phil Dopp is used. And, I have placed an order NOT to use disposable underpads, only Dermatherapy underpads. The patient is on a SizeWise bed with Air mattress. Thank you for the consult.  Discussed plan of care with the patient and bedside nurse.  [facility] nurse will not follow at this time.  Please re-consult the [facility] team if needed.  Val Riles, RN, MSN, CWOCN, CNS-BC, pager [phone]

## 2020-08-03 NOTE — Progress Notes (Signed)
PROGRESS NOTE    Amber Williams  ZDG:[id number] DOB: 1975/11/04 DOA: 08/02/2020 PCP: [name], MD    Brief Narrative:  45 year old female with a history of multiple sclerosis, anal cancer, admitted to the hospital with worsening infection/rash in her groin area.  She was noted to have a severe yeast infection in her skin folds with possible superimposed bacterial component.  She was taking terbinafine as an outpatient without significant improvement.  She started on IV antibiotics, Diflucan and nystatin.   Assessment & Plan:   Principal Problem:   Cellulitis of abdominal wall Active Problems:   Hypothyroidism   Multiple sclerosis (HCC)   Neurogenic bladder   Candidal intertrigo   GERD (gastroesophageal reflux disease)   Severe yeast infection in skin folds of groin, with possible super imposed bacterial component -WOC has seen patient, appreciate assistance  -she had been taking terbinafine as an outpatient without improvement -started on diflucan and nystatin -she is also on vancomycin -de escalate as her condition improves  Hypothyroidism -continue on synthroid  GERD -continue on PPI  Multiple sclerosis -she is on Ocrevus and is followed by neurology at [facility]  History of anal cancer -[facility]ed [facility] -she had chemo and radiation in the past      DVT prophylaxis: enoxaparin (LOVENOX) injection 40 mg Start: 08/02/20 2330 SCDs Start: 08/02/20 2316  Code Status: full code Family Communication: discussed with mother at the bedside Disposition Plan: Status is: Inpatient  Remains inpatient appropriate because:IV treatments appropriate due to intensity of illness or inability to take PO   Dispo: The patient is from: Home              Anticipated d/c is to: Home              Patient currently is not medically stable to d/c.   Difficult to place patient No   Consultants:   WOC  Procedures:     Antimicrobials:   Vancomycin  4/4>  Fluconazole 4/5>    Subjective: She describes pain in her groin area. No shortness of breath or chest pain  Objective: Vitals:   08/03/20 0300 08/03/20 0330 08/03/20 0958 08/03/20 1341  BP: (!) 116/50 99/75 (!) 108/50 (!) 115/57  Pulse: (!) 103 100 89 96  Resp: (!) 26 18 19 18   Temp:  99.3 F (37.4 C) 98.3 F (36.8 C) 97.9 F (36.6 C)  TempSrc:  Oral Oral Oral  SpO2: 96% 100% 99% 100%  Weight:      Height:        Intake/Output Summary (Last 24 hours) at 08/03/2020 1505 Last data filed at 08/03/2020 1300 Gross per 24 hour  Intake 600 ml  Output 450 ml  Net 150 ml   Filed Weights   08/02/20 2357  Weight: 90.7 kg    Examination:  General exam: Appears calm and comfortable  Respiratory system: Clear to auscultation. Respiratory effort normal. Cardiovascular system: S1 & S2 heard, RRR. No JVD, murmurs, rubs, gallops or clicks. No pedal edema. Gastrointestinal system: Abdomen is nondistended, soft and nontender. No organomegaly or masses felt. Normal bowel sounds heard. Central nervous system: Alert and oriented. No focal neurological deficits. Extremities: Symmetric 5 x 5 power. Skin: as below Psychiatry: Judgement and insight appear normal. Mood & affect appropriate.             Data Reviewed: I have personally reviewed following labs and imaging studies  CBC: Recent Labs  Lab 08/02/20 1625 08/03/20 0253  WBC 6.7  6.6  NEUTROABS 5.5  --   HGB 12.6 11.8*  HCT 39.7 37.0  MCV 103.4* 101.1*  PLT 225 488   Basic Metabolic Panel: Recent Labs  Lab 08/02/20 1625 08/03/20 0253  NA 134* 136  K 4.1 4.0  CL 103 105  CO2 21* 26  GLUCOSE 84 89  BUN 9 7  CREATININE 0.85 0.89  CALCIUM 9.4 9.3  MG  --  1.9  PHOS  --  2.1*   GFR: Estimated Creatinine Clearance: 89.8 mL/min (by C-G formula based on SCr of 0.89 mg/dL). Liver Function Tests: Recent Labs  Lab 08/02/20 1625 08/03/20 0253  AST 16 13*  ALT 15 14  ALKPHOS 59 57  BILITOT 0.5 0.3   PROT 5.2* 4.9*  ALBUMIN 2.8* 2.7*   No results for input(s): LIPASE, AMYLASE in the last 168 hours. No results for input(s): AMMONIA in the last 168 hours. Coagulation Profile: Recent Labs  Lab 08/03/20 0253  INR 1.1   Cardiac Enzymes: No results for input(s): CKTOTAL, CKMB, CKMBINDEX, TROPONINI in the last 168 hours. BNP (last 3 results) No results for input(s): PROBNP in the last 8760 hours. HbA1C: No results for input(s): HGBA1C in the last 72 hours. CBG: No results for input(s): GLUCAP in the last 168 hours. Lipid Profile: No results for input(s): CHOL, HDL, LDLCALC, TRIG, CHOLHDL, LDLDIRECT in the last 72 hours. Thyroid Function Tests: No results for input(s): TSH, T4TOTAL, FREET4, T3FREE, THYROIDAB in the last 72 hours. Anemia Panel: No results for input(s): VITAMINB12, FOLATE, FERRITIN, TIBC, IRON, RETICCTPCT in the last 72 hours. Sepsis Labs: Recent Labs  Lab 08/02/20 1715  LATICACIDVEN 1.1    Recent Results (from the past 240 hour(s))  Culture, blood (routine x 2)     Status: None (Preliminary result)   Collection Time: 08/02/20  2:35 PM   Specimen: BLOOD RIGHT WRIST  Result Value Ref Range Status   Specimen Description BLOOD RIGHT WRIST  Final   Special Requests   Final    BOTTLES DRAWN AEROBIC AND ANAEROBIC Blood Culture results may not be optimal due to an inadequate volume of blood received in culture bottles   Culture   Final    NO GROWTH < 24 HOURS Performed at [facility], [place] [address] [place], [place] [address]    Report Status PENDING  Incomplete  Culture, blood (routine x 2)     Status: None (Preliminary result)   Collection Time: 08/02/20  4:15 PM   Specimen: BLOOD  Result Value Ref Range Status   Specimen Description BLOOD LEFT ANTECUBITAL  Final   Special Requests   Final    BOTTLES DRAWN AEROBIC AND ANAEROBIC Blood Culture results may not be optimal due to an inadequate volume of blood received in culture bottles   Culture   Final     NO GROWTH < 24 HOURS Performed at [facility], [place] [address] [place], [place] [address]    Report Status PENDING  Incomplete  SARS CORONAVIRUS 2 (TAT 6-24 HRS) Nasopharyngeal Nasopharyngeal Swab     Status: None   Collection Time: 08/03/20 12:06 AM   Specimen: Nasopharyngeal Swab  Result Value Ref Range Status   SARS Coronavirus 2 NEGATIVE NEGATIVE Final    Comment: (NOTE) SARS-CoV-2 target nucleic acids are NOT DETECTED.  The SARS-CoV-2 RNA is generally detectable in upper and lower respiratory specimens during the acute phase of infection. Negative results do not preclude SARS-CoV-2 infection, do not rule out co-infections with other  pathogens, and should not be used as the sole basis for treatment or other patient management decisions. Negative results must be combined with clinical observations, patient history, and epidemiological information. The expected result is Negative.  Fact Sheet for Patients: SugarRoll.be  Fact Sheet for Healthcare Providers: https://www.woods-mathews.com/  This test is not yet approved or cleared by the [place] FDA and  has been authorized for detection and/or diagnosis of SARS-CoV-2 by FDA under an Emergency Use Authorization (EUA). This EUA will remain  in effect (meaning this test can be used) for the duration of the COVID-19 declaration under Se ction 564(b)(1) of the Act, 21 U.S.C. section 360bbb-3(b)(1), unless the authorization is terminated or revoked sooner.  Performed at [facility], [place] [address], [place], [place] [address]          Radiology Studies: DG Chest 2 View  Result Date: 08/02/2020 CLINICAL DATA:  Nonproductive cough for 5 days, has a fungal infection EXAM: CHEST - 2 VIEW COMPARISON:  01/23/2020 FINDINGS: RIGHT jugular Port-A-Cath with tip projecting over cavoatrial junction. Normal heart size, mediastinal contours, and pulmonary vascularity. Lungs  clear. No acute infiltrate, pleural effusion, or pneumothorax. Osseous structures unremarkable. IMPRESSION: No acute abnormalities. Electronically Signed   By: Lavonia Dana M.D.   On: 08/02/2020 13:39   CT ABDOMEN PELVIS W CONTRAST  Result Date: 08/02/2020 CLINICAL DATA:  Abdominal pain EXAM: CT ABDOMEN AND PELVIS WITH CONTRAST TECHNIQUE: Multidetector CT imaging of the abdomen and pelvis was performed using the standard protocol following bolus administration of intravenous contrast. CONTRAST:  147mL OMNIPAQUE IOHEXOL 300 MG/ML  SOLN COMPARISON:  08/12/2019 FINDINGS: Lower chest: Elevation of the left hemidiaphragm, stable. No acute abnormality. Hepatobiliary: No focal hepatic abnormality. Gallbladder unremarkable. Pancreas: No focal abnormality or ductal dilatation. Spleen: No focal abnormality.  Normal size. Adrenals/Urinary Tract: No adrenal abnormality. No focal renal abnormality. No stones or hydronephrosis. Urinary bladder is unremarkable. Stomach/Bowel: Moderate stool burden throughout the colon. Prior gastric bypass. No bowel obstruction. Vascular/Lymphatic: Scattered aortic atherosclerosis. No evidence of aneurysm or adenopathy. Reproductive: Uterus and adnexa unremarkable.  No mass. Other: No free fluid or free air. Musculoskeletal: No acute bony abnormality. IMPRESSION: Moderate stool burden throughout the colon. No acute findings in the abdomen or pelvis. Electronically Signed   By: Rolm Baptise M.D.   On: 08/02/2020 20:31        Scheduled Meds: . Chlorhexidine Gluconate Cloth  6 each Topical Daily  . enoxaparin (LOVENOX) injection  40 mg Subcutaneous Q24H  . nystatin   Topical BID  . sodium chloride flush  10-40 mL Intracatheter Q12H   Continuous Infusions: . fluconazole (DIFLUCAN) IV    . vancomycin       LOS: 1 day    Time spent: 49mins    Kathie Dike, MD [facility]   If 7PM-7AM, please contact night-coverage [email or web address]  08/03/2020, 3:05 PM

## 2020-08-04 DIAGNOSIS — L03311 Cellulitis of abdominal wall: Secondary | ICD-10-CM | POA: Diagnosis not present

## 2020-08-04 LAB — CBC
HCT: 33.9 % — ABNORMAL LOW (ref 36.0–46.0)
Hemoglobin: 10.9 g/dL — ABNORMAL LOW (ref 12.0–15.0)
MCH: 32.1 pg (ref 26.0–34.0)
MCHC: 32.2 g/dL (ref 30.0–36.0)
MCV: 99.7 fL (ref 80.0–100.0)
Platelets: 270 10*3/uL (ref 150–400)
RBC: 3.4 MIL/uL — ABNORMAL LOW (ref 3.87–5.11)
RDW: 13.6 % (ref 11.5–15.5)
WBC: 3.9 10*3/uL — ABNORMAL LOW (ref 4.0–10.5)
nRBC: 0 % (ref 0.0–0.2)

## 2020-08-04 LAB — RENAL FUNCTION PANEL
Albumin: 2.1 g/dL — ABNORMAL LOW (ref 3.5–5.0)
Anion gap: 3 — ABNORMAL LOW (ref 5–15)
BUN: 5 mg/dL — ABNORMAL LOW (ref 6–20)
CO2: 27 mmol/L (ref 22–32)
Calcium: 8.8 mg/dL — ABNORMAL LOW (ref 8.9–10.3)
Chloride: 109 mmol/L (ref 98–111)
Creatinine, Ser: 0.76 mg/dL (ref 0.44–1.00)
GFR, Estimated: >60 mL/min (ref >60)
Glucose, Bld: 82 mg/dL (ref 70–99)
Phosphorus: 2.8 mg/dL (ref 2.5–4.6)
Potassium: 3.7 mmol/L (ref 3.5–5.1)
Sodium: 139 mmol/L (ref 135–145)

## 2020-08-04 MED ORDER — TRAMADOL HCL 50 MG PO TABS: 50.0000 mg | ORAL_TABLET | Freq: Four times a day (QID) | ORAL | Status: DC | PRN

## 2020-08-04 MED ORDER — ACETAMINOPHEN 325 MG PO TABS: 650.0000 mg | ORAL_TABLET | Freq: Four times a day (QID) | ORAL | Status: DC | PRN

## 2020-08-04 MED ORDER — JUVEN PO PACK
1.0000 | PACK | Freq: Two times a day (BID) | ORAL | Status: DC
  Administered 2020-08-04 – 2020-08-06 (×4): 1 via ORAL
  Filled 2020-08-04 (×4): qty 1

## 2020-08-04 MED ORDER — ENSURE ENLIVE PO LIQD: 237.0000 mL | ORAL | Status: DC

## 2020-08-04 MED ORDER — GUAIFENESIN-DM 100-10 MG/5ML PO SYRP
5.0000 mL | ORAL_SOLUTION | ORAL | Status: DC | PRN
  Administered 2020-08-04 – 2020-08-05 (×4): 5 mL via ORAL
  Filled 2020-08-04 (×4): qty 5

## 2020-08-04 MED ORDER — ADULT MULTIVITAMIN W/MINERALS CH
1.0000 | ORAL_TABLET | Freq: Every day | ORAL | Status: DC
  Administered 2020-08-04 – 2020-08-06 (×3): 1 via ORAL
  Filled 2020-08-04 (×3): qty 1

## 2020-08-04 MED ORDER — PROSOURCE PLUS PO LIQD
30.0000 mL | Freq: Every day | ORAL | Status: DC
  Administered 2020-08-04 – 2020-08-05 (×2): 30 mL via ORAL
  Filled 2020-08-04 (×2): qty 30

## 2020-08-04 NOTE — TOC Initial Note (Addendum)
Transition of Care [facility]) - Initial/Assessment Note    Patient Details  Name: Amber Williams MRN: [id number] Date of Birth: [birth year]  Transition of Care [facility]) CM/SW Contact:    Marilu Favre, RN Phone Number: 08/04/2020, 10:21 AM  Clinical Narrative:                  Spoke to patient at bedside. Patient from home with boyfriend. She has walker and wheel chair at home already.   In progression discussed possible discharge home tomorrow. Patient aware.  PT to see patient today. Will follow up with patient after PT eval.   Patient does not have hospital bed or air mattress at home. Insurance requires patient to have stage 4 wound. Would need a progress note with wound description and stage to submit to insurance. Secure chatted MD and nurse.  Submitted WOC note to [place], unfortunately this does not met insurance criteria for air mattress at home. Chatted WOC And MD   Patient Goals and CMS Choice Patient states their goals for this hospitalization and ongoing recovery are:: to return to home CMS Medicare.gov Compare Post Acute Care list provided to:: Patient    Expected Discharge Plan and Services     Discharge Planning Services: CM Consult   Living arrangements for the past 2 months: Single Family Home                                      Prior Living Arrangements/Services Living arrangements for the past 2 months: Single Family Home Lives with:: Significant Other Patient language and need for interpreter reviewed:: Yes Do you feel safe going back to the place where you live?: Yes      Need for Family Participation in Patient Care: Yes (Comment) Care giver support system in place?: Yes (comment) Current home services: DME Criminal Activity/Legal Involvement Pertinent to Current Situation/Hospitalization: No - Comment as needed  Activities of Daily Living Home Assistive Devices/Equipment: None ADL Screening (condition at time of  admission) Patient's cognitive ability adequate to safely complete daily activities?: Yes Is the patient deaf or have difficulty hearing?: No Does the patient have difficulty seeing, even when wearing glasses/contacts?: Yes Does the patient have difficulty concentrating, remembering, or making decisions?: No Patient able to express need for assistance with ADLs?: Yes Does the patient have difficulty dressing or bathing?: Yes Independently performs ADLs?: No Communication: Independent Feeding: Independent Bathing: Needs assistance Toileting: Needs assistance Does the patient have difficulty walking or climbing stairs?: Yes Weakness of Legs: Both Weakness of Arms/Hands: Both  Permission Sought/Granted   Permission granted to share information with : No              Emotional Assessment Appearance:: Appears stated age Attitude/Demeanor/Rapport: Engaged Affect (typically observed): Accepting Orientation: : Oriented to Self,Oriented to Place,Oriented to  Time,Oriented to Situation Alcohol / Substance Use: Not Applicable Psych Involvement: No (comment)  Admission diagnosis:  Cellulitis of abdominal wall [L03.311] Patient Active Problem List   Diagnosis Date Noted  . Candidal intertrigo 08/03/2020  . GERD (gastroesophageal reflux disease) 08/03/2020  . Cellulitis of abdominal wall 08/02/2020  . Dark red stool   . Personal history of malignant neoplasm of rectum, rectosigmoid junction, and anus   . Genetic testing 08/11/2019  . Family history of breast cancer   . Port-A-Cath in place 01/23/2019  . Anal cancer ([place]) 01/10/2019  . Anal condyloma   .  Hemorrhoids, external, with complication 64/35/3912  . Neurogenic bladder 08/03/2016  . Gait abnormality 08/03/2016  . Right foot drop 08/03/2016  . Hypothyroidism 01/31/2016  . Insomnia 01/31/2016  . Multiple sclerosis ([place]) 01/31/2016  . PTSD (post-traumatic stress disorder) 01/31/2016  . Depression 01/16/2011   PCP:   [name], MD Pharmacy:   [place], [place] [address] [place] [address] Phone: [phone] Fax: [phone]  [id number] [place]: [phone]: [phone]    Social Determinants of Health (SDOH) Interventions    Readmission Risk Interventions No flowsheet data found.

## 2020-08-04 NOTE — Consult Note (Signed)
[facility] Nurse Consult Note: Patient seen and thoroughly examined yesterday, orders placed.  I have communicated this to Dr. Marlowe Sax. He asked if the patient needs an air mattress for home.  The answer is "yes, she does need an air mattress for home" if it is a low air loss mattress, not a static air mattress.  This would help provide microclimate control of the excessively wet body folds.  Her issues are directly related to excessive moisture in the body folds, compounded by incontinence and inability to properly perform hygiene measures. Val Riles, RN, MSN, CWOCN, CNS-BC, pager [phone]

## 2020-08-04 NOTE — Progress Notes (Signed)
Initial Nutrition Assessment  DOCUMENTATION CODES:   Obesity unspecified  INTERVENTION:  - will order Ensure Enlive BID, each supplement provides 350 kcal and 20 grams of protein. - will order 30 ml Prosource Plus once/day, each supplement provides 100 kcal and 15 grams protein.  - will order 1 tablet multivitamin with minerals/day. - will order Juven BID, each packet provides 95 calories, 2.5 grams of protein (collagen), and 9.8 grams of carbohydrate (3 grams sugar); also contains 7 grams of L-arginine and L-glutamine, 300 mg vitamin C, 15 mg vitamin E, 1.2 mcg vitamin B-12, 9.5 mg zinc, 200 mg calcium, and 1.5 g  Calcium Beta-hydroxy-Beta-methylbutyrate to support wound healing.   NUTRITION DIAGNOSIS:   Increased nutrient needs related to acute illness as evidenced by estimated needs.  GOAL:   Patient will meet greater than or equal to 90% of their needs  MONITOR:   PO intake,Supplement acceptance,Labs,Weight trends  REASON FOR ASSESSMENT:   Malnutrition Screening Tool  ASSESSMENT:   45 year old female with medical history of MS, anal cancer, arthritis, HLD, R foot drop, hypothyroidism, HTN, and GERD. She presented to the ED with worsening infection/rash in her groin area.  She was noted to have a severe yeast infection in her skin folds with possible superimposed bacterial component.  She was taking terbinafine as an outpatient without significant improvement.  She started on IV antibiotics, Diflucan and nystatin.  Patient ate 100% of breakfast and 75% of lunch yesterday (total of 974 kcal and 44 grams protein) and 100% of breakfast this AM (500 kcal and 10 grams protein).   Patient laying in bed. She reports eating 100% of lunch which consisted of a breaded chicken breast, graham crackers, and a dinner roll.   She reports that her appetite had been decreased for ~1 week and then it began to improve and has been back, nearly to baseline, for about 3-4 days.   Patient reports  having gastric bypass surgery in 1443; no complications since that time.  At her heaviest, she was close to 300 lb and her lowest weight was ~135 lb. She likes to stay around 175 lb.  She is able to ambulate with a rolling walker at home although the few days PTA her arms were too weak to do so.   She is able to self-feed without issue.   Discussed the importance of protein to aid in skin healing. Patient expresses understanding.   Weight on 4/4 was 200 lb, which appears to be a stated weight, and is also exactly the same as weight recorded on 07/06/20. Weight on 06/01/20 was 203 lb.    Labs reviewed; BUN: 5 mg/dl. Medications reviewed; 125 mcg oral synthroid/day, 40 mg oral protonix/day.    NUTRITION - FOCUSED PHYSICAL EXAM:  completed; no muscle or fat depletions.  Diet Order:   Diet Order            Diet Heart Room service appropriate? Yes; Fluid consistency: Thin  Diet effective now                 EDUCATION NEEDS:   Education needs have been addressed  Skin:  Skin Assessment: Skin Integrity Issues: Skin Integrity Issues:: Other (Comment) Other: IAD to pelvis  Last BM:  4/6 (type 3 x1)  Height:   Ht Readings from Last 1 Encounters:  08/02/20 5\' 5"  (1.651 m)    Weight:   Wt Readings from Last 1 Encounters:  08/02/20 90.7 kg     Estimated Nutritional Needs:  Kcal:  1850-2050 kcal Protein:  90-100 grams Fluid:  >/= 1.8 L/day      Jarome Matin, MS, RD, LDN, CNSC Inpatient Clinical Dietitian RD pager # available in AMION  After hours/weekend pager # available in [facility]

## 2020-08-04 NOTE — Progress Notes (Signed)
PROGRESS NOTE    Amber Williams  EXB:[id number] DOB: 1976-03-08 DOA: 08/02/2020 PCP: [name], MD    Brief Narrative:  45 year old female with a history of multiple sclerosis, anal cancer, admitted to the hospital with worsening infection/rash in her groin area.  She was noted to have a severe yeast infection in her skin folds with possible superimposed bacterial component.  She was taking terbinafine as an outpatient without significant improvement.  She started on IV antibiotics, Diflucan and nystatin.   Assessment & Plan:   Principal Problem:   Cellulitis of abdominal wall Active Problems:   Hypothyroidism   Multiple sclerosis (HCC)   Neurogenic bladder   Candidal intertrigo   GERD (gastroesophageal reflux disease)   Severe yeast infection in skin folds of groin, with possible super imposed bacterial component -WOC has seen patient, appreciate assistance  -she had been taking terbinafine as an outpatient without improvement -started on diflucan and nystatin -she is also on vancomycin -de escalate as her condition improves Per wound care patient will require air mattress to help with ulcer healing but per case manager insurance will not cover. Continue dressing changes and Interdry.  Hypothyroidism -continue on synthroid  GERD -continue on PPI  Multiple sclerosis -she is on Ocrevus and is [facility]y [facility]  History of anal cancer -[facility]ed [facility] -she had chemo and radiation in the past  Unclear etiology of the maculopapular rash. We will monitor overnight and consider further treatment tomorrow depending on further work-up.  No new lesions no new chemicals.  May benefit from steroid.    DVT prophylaxis: enoxaparin (LOVENOX) injection 40 mg Start: 08/02/20 2330 SCDs Start: 08/02/20 2316  Code Status: full code Family Communication: discussed with mother at the bedside Disposition Plan: Status is: Inpatient  Remains inpatient  appropriate because:IV treatments appropriate due to intensity of illness or inability to take PO   Dispo: The patient is from: Home              Anticipated d/c is to: Home              Patient currently is not medically stable to d/c.   Difficult to place patient No   Consultants:   WOC  Procedures:     Antimicrobials:   Vancomycin 4/4>  Fluconazole 4/5>    Subjective: Pain still present.  No nausea no vomiting but no fever no chills.  No diarrhea.  Tolerating antibiotics.  Diffuse maculopapular rash reported on bilateral upper extremity  Objective: Vitals:   08/03/20 2119 08/04/20 0539 08/04/20 1524 08/04/20 1526  BP: (!) 113/52 (!) 110/56 (!) 104/50 (!) 95/45  Pulse: 91 77  89  Resp: 19 17  19   Temp: 98.5 F (36.9 C) 98.3 F (36.8 C) 98.1 F (36.7 C) 98.1 F (36.7 C)  TempSrc: Oral Oral Axillary Axillary  SpO2: 97% 96%  99%  Weight:      Height:        Intake/Output Summary (Last 24 hours) at 08/04/2020 1919 Last data filed at 08/04/2020 1500 Gross per 24 hour  Intake 2349 ml  Output 2400 ml  Net -51 ml   Filed Weights   08/02/20 2357  Weight: 90.7 kg    Examination:  General: Appear in mild distress, maculopapular rash; Oral Mucosa Clear, moist. no Abnormal Neck Mass Or lumps, Conjunctiva normal  Cardiovascular: S1 and S2 Present, no Murmur, Respiratory: good respiratory effort, Bilateral Air entry present and CTA, no Crackles, no wheezes Abdomen:  Bowel Sound present, Soft and no tenderness Extremities: no Pedal edema Neurology: alert and oriented to time, place, and person affect appropriate. no new focal deficit Gait not checked due to patient safety concerns    Data Reviewed: I have personally reviewed following labs and imaging studies  CBC: Recent Labs  Lab 08/02/20 1625 08/03/20 0253 08/04/20 0400  WBC 6.7 6.6 3.9*  NEUTROABS 5.5  --   --   HGB 12.6 11.8* 10.9*  HCT 39.7 37.0 33.9*  MCV 103.4* 101.1* 99.7  PLT 225 278 161    Basic Metabolic Panel: Recent Labs  Lab 08/02/20 1625 08/03/20 0253 08/04/20 0400  NA 134* 136 139  K 4.1 4.0 3.7  CL 103 105 109  CO2 21* 26 27  GLUCOSE 84 89 82  BUN 9 7 5*  CREATININE 0.85 0.89 0.76  CALCIUM 9.4 9.3 8.8*  MG  --  1.9  --   PHOS  --  2.1* 2.8   GFR: Estimated Creatinine Clearance: 99.9 mL/min (by C-G formula based on SCr of 0.76 mg/dL). Liver Function Tests: Recent Labs  Lab 08/02/20 1625 08/03/20 0253 08/04/20 0400  AST 16 13*  --   ALT 15 14  --   ALKPHOS 59 57  --   BILITOT 0.5 0.3  --   PROT 5.2* 4.9*  --   ALBUMIN 2.8* 2.7* 2.1*   No results for input(s): LIPASE, AMYLASE in the last 168 hours. No results for input(s): AMMONIA in the last 168 hours. Coagulation Profile: Recent Labs  Lab 08/03/20 0253  INR 1.1   Cardiac Enzymes: No results for input(s): CKTOTAL, CKMB, CKMBINDEX, TROPONINI in the last 168 hours. BNP (last 3 results) No results for input(s): PROBNP in the last 8760 hours. HbA1C: No results for input(s): HGBA1C in the last 72 hours. CBG: No results for input(s): GLUCAP in the last 168 hours. Lipid Profile: No results for input(s): CHOL, HDL, LDLCALC, TRIG, CHOLHDL, LDLDIRECT in the last 72 hours. Thyroid Function Tests: No results for input(s): TSH, T4TOTAL, FREET4, T3FREE, THYROIDAB in the last 72 hours. Anemia Panel: No results for input(s): VITAMINB12, FOLATE, FERRITIN, TIBC, IRON, RETICCTPCT in the last 72 hours. Sepsis Labs: Recent Labs  Lab 08/02/20 1715  LATICACIDVEN 1.1    Recent Results (from the past 240 hour(s))  Culture, blood (routine x 2)     Status: None (Preliminary result)   Collection Time: 08/02/20  2:35 PM   Specimen: BLOOD RIGHT WRIST  Result Value Ref Range Status   Specimen Description BLOOD RIGHT WRIST  Final   Special Requests   Final    BOTTLES DRAWN AEROBIC AND ANAEROBIC Blood Culture results may not be optimal due to an inadequate volume of blood received in culture bottles    Culture   Final    NO GROWTH 2 DAYS Performed at [facility], [place] [address], [place], [place] [address]    Report Status PENDING  Incomplete  Culture, blood (routine x 2)     Status: None (Preliminary result)   Collection Time: 08/02/20  4:15 PM   Specimen: BLOOD  Result Value Ref Range Status   Specimen Description BLOOD LEFT ANTECUBITAL  Final   Special Requests   Final    BOTTLES DRAWN AEROBIC AND ANAEROBIC Blood Culture results may not be optimal due to an inadequate volume of blood received in culture bottles   Culture   Final    NO GROWTH 2 DAYS Performed at [facility], [address] [place], [place] [address]    Report Status PENDING  Incomplete  SARS CORONAVIRUS 2 (TAT 6-24 HRS) Nasopharyngeal Nasopharyngeal Swab     Status: None   Collection Time: 08/03/20 12:06 AM   Specimen: Nasopharyngeal Swab  Result Value Ref Range Status   SARS Coronavirus 2 NEGATIVE NEGATIVE Final    Comment: (NOTE) SARS-CoV-2 target nucleic acids are NOT DETECTED.  The SARS-CoV-2 RNA is generally detectable in upper and lower respiratory specimens during the acute phase of infection. Negative results do not preclude SARS-CoV-2 infection, do not rule out co-infections with other pathogens, and should not be used as the sole basis for treatment or other patient management decisions. Negative results must be combined with clinical observations, patient history, and epidemiological information. The expected result is Negative.  Fact Sheet for Patients: SugarRoll.be  Fact Sheet for Healthcare Providers: https://www.woods-mathews.com/  This test is not yet approved or cleared by the [place] FDA and  has been authorized for detection and/or diagnosis of SARS-CoV-2 by FDA under an Emergency Use Authorization (EUA). This EUA will remain  in effect (meaning this test can be used) for the duration of the COVID-19 declaration under Se  ction 564(b)(1) of the Act, 21 U.S.C. section 360bbb-3(b)(1), unless the authorization is terminated or revoked sooner.  Performed at [facility], [place] [address], [place], [place] [address]          Radiology Studies: CT ABDOMEN PELVIS W CONTRAST  Result Date: 08/02/2020 CLINICAL DATA:  Abdominal pain EXAM: CT ABDOMEN AND PELVIS WITH CONTRAST TECHNIQUE: Multidetector CT imaging of the abdomen and pelvis was performed using the standard protocol following bolus administration of intravenous contrast. CONTRAST:  126mL OMNIPAQUE IOHEXOL 300 MG/ML  SOLN COMPARISON:  08/12/2019 FINDINGS: Lower chest: Elevation of the left hemidiaphragm, stable. No acute abnormality. Hepatobiliary: No focal hepatic abnormality. Gallbladder unremarkable. Pancreas: No focal abnormality or ductal dilatation. Spleen: No focal abnormality.  Normal size. Adrenals/Urinary Tract: No adrenal abnormality. No focal renal abnormality. No stones or hydronephrosis. Urinary bladder is unremarkable. Stomach/Bowel: Moderate stool burden throughout the colon. Prior gastric bypass. No bowel obstruction. Vascular/Lymphatic: Scattered aortic atherosclerosis. No evidence of aneurysm or adenopathy. Reproductive: Uterus and adnexa unremarkable.  No mass. Other: No free fluid or free air. Musculoskeletal: No acute bony abnormality. IMPRESSION: Moderate stool burden throughout the colon. No acute findings in the abdomen or pelvis. Electronically Signed   By: Rolm Baptise M.D.   On: 08/02/2020 20:31        Scheduled Meds: . (feeding supplement) PROSource Plus  30 mL Oral Daily  . aspirin EC  81 mg Oral QHS  . Chlorhexidine Gluconate Cloth  6 each Topical Daily  . enoxaparin (LOVENOX) injection  40 mg Subcutaneous Q24H  . escitalopram  10 mg Oral Daily  . [START ON 08/05/2020] feeding supplement  237 mL Oral Q24H  . levothyroxine  125 mcg Oral QAC breakfast  . multivitamin with minerals  1 tablet Oral Daily  . nutrition supplement  (JUVEN)  1 packet Oral BID BM  . nystatin   Topical BID  . pantoprazole  40 mg Oral Daily  . sodium chloride flush  10-40 mL Intracatheter Q12H  . traZODone  150 mg Oral QHS   Continuous Infusions: . fluconazole (DIFLUCAN) IV 100 mg (08/04/20 1703)  . vancomycin 1,250 mg (08/04/20 0846)     LOS: 2 days    Time spent: 3mins    Berle Mull, MD [facility]   If 7PM-7AM, please  contact night-coverage [email or web address]  08/04/2020, 7:19 PM

## 2020-08-05 ENCOUNTER — Encounter (HOSPITAL_BASED_OUTPATIENT_CLINIC_OR_DEPARTMENT_OTHER): Payer: Medicare HMO | Admitting: Internal Medicine

## 2020-08-05 DIAGNOSIS — L03311 Cellulitis of abdominal wall: Secondary | ICD-10-CM | POA: Diagnosis not present

## 2020-08-05 MED ORDER — DOXYCYCLINE HYCLATE 100 MG PO TABS
100.0000 mg | ORAL_TABLET | Freq: Two times a day (BID) | ORAL | Status: DC
  Administered 2020-08-05 – 2020-08-06 (×3): 100 mg via ORAL
  Filled 2020-08-05 (×3): qty 1

## 2020-08-05 MED ORDER — FLUCONAZOLE 100 MG PO TABS
100.0000 mg | ORAL_TABLET | Freq: Every day | ORAL | Status: DC
  Administered 2020-08-05 – 2020-08-06 (×2): 100 mg via ORAL
  Filled 2020-08-05 (×2): qty 1

## 2020-08-05 MED ORDER — CEPHALEXIN 500 MG PO CAPS
500.0000 mg | ORAL_CAPSULE | Freq: Two times a day (BID) | ORAL | Status: DC
  Administered 2020-08-05 – 2020-08-06 (×2): 500 mg via ORAL
  Filled 2020-08-05 (×2): qty 1

## 2020-08-05 NOTE — Evaluation (Signed)
Physical Therapy Evaluation Patient Details Name: Amber Williams MRN: [id number] DOB: 1975-05-09 Today's Date: 08/05/2020   History of Present Illness  45 y.o. female admitted on 08/02/20 forprogressive wound/rash on abdomen. Dx with severe yeast infection of skin folds in groin, possible superimposed bacterial component.  WOC RN was consulted.  Pt with significant PMH of MS (since the age of 1), anal CA (s/p chemo, radiation and multible surgeries, PTSD, neurogenic bladder (frequent urination and incontinence), R foot drop, DVT, essential HTN, Gastric bypass (2010).  Clinical Impression  Pt subjectively reports she is 50% weaker than normal.  Two person assist initially required to safely power up to RW, but once she got herself and stabilized over locked out R knee she was able to transfer to [facility] and back with min assist.  She uses a rollator normally, so I would like to bring one by tomorrow to see if she does ok with it.  We were brain storming strategies with the OT and her mom re: how to keep this area dry given here significant incontinence and chronic depends use (see details below).   PT to follow acutely for deficits listed below.      Follow Up Recommendations No PT follow up;Other (comment);Supervision for mobility/OOB (Pt has had a lot of home therapy, feels she will be fine once home and healing.)    Equipment Recommendations  Other (comment) (home purwick system?)    Recommendations for Other Services       Precautions / Restrictions Precautions Precautions: Fall Precaution Comments: h/o falls      Mobility  Bed Mobility Overal bed mobility: Needs Assistance Bed Mobility: Supine to Sit;Sit to Supine     Supine to sit: Supervision;HOB elevated Sit to supine: HOB elevated;Supervision   General bed mobility comments: close supervision for safety, pt able to slide legs over on slick air mattress.  Heavy use of rail for leverage at trunk.  Pt reports sleeping in a  recliner, has difficulty with bed mobility at home.    Transfers Overall transfer level: Needs assistance Equipment used: Rolling walker (2 wheeled) Transfers: Sit to/from [facility] Sit to Stand: Mod assist;Min assist;+2 physical assistance Stand pivot transfers: Min assist;+2 physical assistance       General transfer comment: Mod assist for initial stand from bed with right knee blocked, pt locks out R knee into hyperextension for increase stability in stance, and then hip hikes to lift R foot to clear secondary to DF weakness.  She reports she has tried a brace before, but finds they are too heavy and cumbersome.  Min assist for second stand from [facility] (I think it helped to use the rails on [facility] for better leverage) and min assist for standing and transferring with RW.  Ambulation/Gait Ambulation/Gait assistance:  (multiple pivotal steps with RW.  If we walk, she will need something (depends) to help with incontinence which we can remove immediately after.)              Stairs            Wheelchair Mobility    Modified Rankin (Stroke Patients Only)       Balance Overall balance assessment: Needs assistance Sitting-balance support: Feet supported;No upper extremity supported;Single extremity supported Sitting balance-Leahy Scale: Good     Standing balance support: Single extremity supported Standing balance-Leahy Scale: Poor Standing balance comment: needs at least one hand supported on RW and pt leaning her R leg against BSC or bed for stability  in standing (had to stabilize the [facility] as she was tipping it)                             Pertinent Vitals/Pain Pain Assessment: 0-10 Pain Score: 2  (at rest) Faces Pain Scale: Hurts little more (with bending forward most stabbing pain) Pain Location: skin folds at abdomen and bil groin Pain Descriptors / Indicators: Stabbing Pain Intervention(s): Limited activity within patient's  tolerance;Monitored during session;Repositioned    Home Living Family/patient expects to be discharged to:: Private residence Living Arrangements: Spouse/significant other (boyfried, 2 dogs) Available Help at Discharge: Family;Available 24 hours/day Type of Home: House Home Access: Stairs to enter Entrance Stairs-Rails: None Entrance Stairs-Number of Steps: 1 Home Layout: One level Home Equipment: Walker - 4 wheels;Cane - single point;Shower seat - built in;Grab bars - tub/shower;Hand held shower head;Wheelchair - manual Additional Comments: wears glasses all the time, bifocals    Prior Function Level of Independence: Needs assistance   Gait / Transfers Assistance Needed: used RW at home, cane and boyfriend for community access.  ADL's / Homemaking Assistance Needed: normally no help bathing dressing toileting, shared household chores.  Comments: does not drive "as much as i used to", boyfriend.     Hand Dominance   Dominant Hand: Right    Extremity/Trunk Assessment   Upper Extremity Assessment Upper Extremity Assessment: Defer to OT evaluation    Lower Extremity Assessment Lower Extremity Assessment: RLE deficits/detail RLE Deficits / Details: right leg with significant weakness, pt reports during MS flare this leg gets really weak, normally at baseline without a flare is weak as well, but not as weak as it is currently. Ankle DF 2-/5, knee extension 3-/5, hip NT RLE Sensation: history of peripheral neuropathy    Cervical / Trunk Assessment Cervical / Trunk Assessment: Other exceptions Cervical / Trunk Exceptions: Pt reports chronic back issues and pain due to multiple falls  Communication   Communication: No difficulties  Cognition Arousal/Alertness: Awake/alert Behavior During Therapy: WFL for tasks assessed/performed Overall Cognitive Status: Within Functional Limits for tasks assessed                                        General Comments  General comments (skin integrity, edema, etc.): problem solved a few ideas on how pt is going to stay dry and use depends limited at discharge so her rash can heal.  We talked about using a home purwick or possibly mentioning if a foley catheter (temporarily) would be beneficial to keep the moisture away (we did mention that foley might not be great as that is another source of infection).    Exercises     Assessment/Plan    PT Assessment Patient needs continued PT services  PT Problem List Decreased strength;Decreased activity tolerance;Decreased balance;Decreased mobility;Decreased knowledge of use of DME;Decreased skin integrity;Pain       PT Treatment Interventions DME instruction;Gait training;Functional mobility training;Therapeutic activities;Therapeutic exercise;Balance training;Neuromuscular re-education;Stair training;Cognitive remediation;Patient/family education;Wheelchair mobility training    PT Goals (Current goals can be found in the Care Plan section)  Acute Rehab PT Goals Patient Stated Goal: to get stronger, heal this rash, control the moisture. PT Goal Formulation: With patient Time For Goal Achievement: 08/19/20 Potential to Achieve Goals: Good    Frequency Min 3X/week   Barriers to discharge        Co-evaluation  PT/OT/SLP Co-Evaluation/Treatment: Yes Reason for Co-Treatment: For patient/therapist safety;To address functional/ADL transfers;Complexity of the patient's impairments (multi-system involvement) PT goals addressed during session: Mobility/safety with mobility;Balance;Proper use of DME;Strengthening/ROM         AM-PAC PT "6 Clicks" Mobility  Outcome Measure Help needed turning from your back to your side while in a flat bed without using bedrails?: A Little Help needed moving from lying on your back to sitting on the side of a flat bed without using bedrails?: A Little Help needed moving to and from a bed to a chair (including a wheelchair)?:  Total Help needed standing up from a chair using your arms (e.g., wheelchair or bedside chair)?: Total Help needed to walk in hospital room?: A Little Help needed climbing 3-5 steps with a railing? : Total 6 Click Score: 12    End of Session   Activity Tolerance: Patient tolerated treatment well Patient left: in bed;with call bell/phone within reach;with family/visitor present   PT Visit Diagnosis: Muscle weakness (generalized) (M62.81);Difficulty in walking, not elsewhere classified (R26.2);Other symptoms and signs involving the nervous system (R29.898)    Time: 1340-1445 PT Time Calculation (min) (ACUTE ONLY): 65 min   Charges:   PT Evaluation $PT Eval Moderate Complexity: 1 Mod PT Treatments $Therapeutic Activity: 8-22 mins      Verdene Lennert, PT, DPT  Acute Rehabilitation [phone] pager [phone]) [phone] office

## 2020-08-05 NOTE — Care Management Important Message (Signed)
Important Message  Patient Details  Name: Amber Williams MRN: [id number] Date of Birth: 1975-12-19   Medicare Important Message Given:  Yes     Orbie Pyo 08/05/2020, 3:28 PM

## 2020-08-05 NOTE — TOC Progression Note (Addendum)
Transition of Care [facility]) - Progression Note    Patient Details  Name: Amber Williams MRN: [id number] Date of Birth: 04/29/1976  Transition of Care [facility]) CM/SW Contact  Jacalyn Lefevre Edson Snowball, RN Phone Number: 08/05/2020, 3:46 PM  Clinical Narrative:      [employer] companies with sell PureWick [employer] and [employer], both companies sell PureWick however they do not accept insurance .   [employer] [phone] is the manufacture of PureWicks , called spoke to Merrilee Seashore, per Merrilee Seashore at this time insurance does not cover [place] . Patient would need to pay out of pocket . Pumps alone start at $400 to $700.   Went to patient room. Patient receiving personnel care at present will follow up later.       Expected Discharge Plan and Services     Discharge Planning Services: CM Consult   Living arrangements for the past 2 months: Single Family Home                                       Social Determinants of Health (SDOH) Interventions    Readmission Risk Interventions No flowsheet data found.

## 2020-08-05 NOTE — Progress Notes (Signed)
[facility] Progress Note  Patient: Amber Williams    ZWC:[id number]  DOA: 08/02/2020     Date of Service: the patient was seen and examined on 08/05/2020  Brief hospital course: 45 year old female with a history of multiple sclerosis, anal cancer, admitted to the hospital with worsening infection/rash in her groin area.  She was noted to have a severe yeast infection in her skin folds with possible superimposed bacterial component.  She was taking terbinafine as an outpatient without significant improvement.  She started on IV antibiotics, Diflucan and nystatin. Currently plan is monitor response on oral antibiotics.  Assessment and Plan: Severe yeast infection in skin folds of groin, with possible super imposed bacterial cellulitis -WOC has seen patient, appreciate assistance  -she had been taking terbinafine as an outpatient without improvement -started on diflucan -she is also on vancomycin, changed to oral doxycycline and Keflex. Blood cultures negative. Per wound care patient will require air mattress to help with ulcer healing but per case manager insurance will not cover. Continue dressing changes and Interdry.  Hypothyroidism -continue on synthroid  GERD -continue on PPI  Multiple sclerosis -she is on Ocrevus and is followed by neurology at [facility]  History of anal cancer -followed at [facility] -she had chemo and radiation in the past  Unclear etiology of the maculopapular rash. Monitor  Obesity. Placing the patient at high risk for poor outcome in the setting of worsening skin infection.  As well as pressure ulcers. Body mass index is 33.28 kg/m.   Diet: Regular diet DVT Prophylaxis:   enoxaparin (LOVENOX) injection 40 mg Start: 08/02/20 2330 SCDs Start: 08/02/20 2316    Advance goals of care discussion: Full code  Family Communication: family was present at bedside, at the time of interview.  The pt provided permission to discuss medical plan  with the family. Opportunity was given to ask question and all questions were answered satisfactorily.   Disposition:  Status is: Inpatient  Remains inpatient appropriate because: Need to monitor on oral antibiotics given worsening currently on IV antibiotics.  Dispo: The patient is from: Home              Anticipated d/c is to: Home              Patient currently is not medically stable to d/c.   Difficult to place patient   Subjective: No nausea no vomiting.  Reports worsening of redness and swelling of the rash that she has.  No nausea or vomiting.  No diarrhea.  Physical Exam:  General: Appear in mild distress, diffuse maculopapular rash; Oral Mucosa Clear, moist. no Abnormal Neck Mass Or lumps, Conjunctiva normal  Cardiovascular: S1 and S2 Present, no Murmur, Respiratory: good respiratory effort, Bilateral Air entry present and CTA, no Crackles, no wheezes Abdomen: Bowel Sound present, Soft and no tenderness Extremities: no Pedal edema Neurology: alert and oriented to time, place, and person affect appropriate. no new focal deficit Gait not checked due to patient safety concerns     Vitals:   08/04/20 1526 08/04/20 2353 08/05/20 0544 08/05/20 1512  BP: (!) 95/45 (!) 99/55 107/60 107/60  Pulse: 89 80 81 84  Resp: 19 18 18 18   Temp: 98.1 F (36.7 C) 98.2 F (36.8 C) 98.3 F (36.8 C) 97.8 F (36.6 C)  TempSrc: Axillary Oral Oral   SpO2: 99% 96% 97% 99%  Weight:      Height:        Intake/Output Summary (Last 24 hours)  at 08/05/2020 1922 Last data filed at 08/05/2020 1800 Gross per 24 hour  Intake 1000 ml  Output 3150 ml  Net -2150 ml   Filed Weights   08/02/20 2357  Weight: 90.7 kg    Data Reviewed: I have personally reviewed and interpreted daily labs, tele strips, imaging. I reviewed all nursing notes, pharmacy notes, vitals, pertinent old records I have discussed plan of care as described above with RN and patient/family.  CBC: Recent Labs  Lab  08/02/20 1625 08/03/20 0253 08/04/20 0400  WBC 6.7 6.6 3.9*  NEUTROABS 5.5  --   --   HGB 12.6 11.8* 10.9*  HCT 39.7 37.0 33.9*  MCV 103.4* 101.1* 99.7  PLT 225 278 038   Basic Metabolic Panel: Recent Labs  Lab 08/02/20 1625 08/03/20 0253 08/04/20 0400  NA 134* 136 139  K 4.1 4.0 3.7  CL 103 105 109  CO2 21* 26 27  GLUCOSE 84 89 82  BUN 9 7 5*  CREATININE 0.85 0.89 0.76  CALCIUM 9.4 9.3 8.8*  MG  --  1.9  --   PHOS  --  2.1* 2.8    Studies: No results found.  Scheduled Meds: . (feeding supplement) PROSource Plus  30 mL Oral Daily  . aspirin EC  81 mg Oral QHS  . cephALEXin  500 mg Oral Q12H  . Chlorhexidine Gluconate Cloth  6 each Topical Daily  . doxycycline  100 mg Oral Q12H  . enoxaparin (LOVENOX) injection  40 mg Subcutaneous Q24H  . escitalopram  10 mg Oral Daily  . feeding supplement  237 mL Oral Q24H  . fluconazole  100 mg Oral Daily  . levothyroxine  125 mcg Oral QAC breakfast  . multivitamin with minerals  1 tablet Oral Daily  . nutrition supplement (JUVEN)  1 packet Oral BID BM  . nystatin   Topical BID  . pantoprazole  40 mg Oral Daily  . sodium chloride flush  10-40 mL Intracatheter Q12H  . traZODone  150 mg Oral QHS   Continuous Infusions: PRN Meds: acetaminophen, guaiFENesin-dextromethorphan, sodium chloride flush, temazepam, traMADol  Time spent: 35 minutes  Author: Berle Mull, MD [facility] 08/05/2020 7:22 PM  To reach On-call, see care teams to locate the attending and reach out via [email or web address]. Between 7PM-7AM, please contact night-coverage If you still have difficulty reaching the attending provider, please page the [facility] (Director on Call) for [facility] on amion for assistance.

## 2020-08-05 NOTE — Evaluation (Addendum)
Occupational Therapy Evaluation Patient Details Name: Amber Williams MRN: [id number] DOB: [birth year] Today's Date: 08/05/2020    History of Present Illness 45 y.o. female admitted on 08/02/20 forprogressive wound/rash on abdomen. Dx with severe yeast infection of skin folds in groin, possible superimposed bacterial component.  WOC RN was consulted.  Pt with significant PMH of MS (since the age of 16), anal CA (s/p chemo, radiation and multible surgeries, PTSD, neurogenic bladder (frequent urination and incontinence), R foot drop, DVT, essential HTN, Gastric bypass (2010).   Clinical Impression   Pt presents with decline in function and safety with ADLs and ADL mobility with impaired strength, balance and endurance. PTA, pt was Ind with ADLs/selfcare and used a rollater for mobility. Pt reports that she is 50% back to her baseline level of function and feels more weakness and fatigue. Pt and he mother asking for ideas to keep pt dry in peri area as pt is incontinent so we were brain storming strategies with the pt and her mom re: how to keep this area dry given here significant incontinence  (changing Depends brief more often and/or home purewick system). Pt would benefit from acute OT services to address impairments to maximize level of function and safety  Follow Up Recommendations  No OT follow up;Supervision - Intermittent    Equipment Recommendations  Other (comment) (home Purewick system)    Recommendations for Other Services       Precautions / Restrictions Precautions Precautions: Fall Precaution Comments: h/o falls Restrictions Weight Bearing Restrictions: No      Mobility Bed Mobility Overal bed mobility: Needs Assistance Bed Mobility: Supine to Sit;Sit to Supine     Supine to sit: Supervision;HOB elevated Sit to supine: HOB elevated;Supervision   General bed mobility comments: close supervision for safety, pt able to slide legs over on slick air mattress.  Heavy use of  rail for leverage at trunk.  Pt reports sleeping in a recliner, has difficulty with bed mobility at home.    Transfers Overall transfer level: Needs assistance Equipment used: Rolling walker (2 wheeled) Transfers: Sit to/from [facility] Sit to Stand: Mod assist;Min assist;+2 physical assistance Stand pivot transfers: Min assist;+2 physical assistance       General transfer comment: Mod assist for initial stand from bed with right knee blocked, pt locks out R knee into hyperextension for increase stability in stance, and then hip hikes to lift R foot to clear secondary to DF weakness.  She reports she has tried a brace before, but finds they are too heavy and cumbersome.  Min assist for second stand from [facility] (I think it helped to use the rails on [facility] for better leverage) and min assist for standing and transferring with RW.    Balance Overall balance assessment: Needs assistance Sitting-balance support: Feet supported;No upper extremity supported;Single extremity supported Sitting balance-Leahy Scale: Good     Standing balance support: Single extremity supported Standing balance-Leahy Scale: Poor Standing balance comment: needs at least one hand supported on RW and pt leaning her R leg against BSC or bed for stability in standing (had to stabilize the [facility] as she was tipping it)                           ADL either performed or assessed with clinical judgement   ADL Overall ADL's : Needs assistance/impaired Eating/Feeding: Independent;Sitting   Grooming: Wash/dry hands;Wash/dry face;Set up;Sitting   Upper Body Bathing: Set up;Supervision/ safety;Sitting  Lower Body Bathing: Moderate assistance;Sitting/lateral leans   Upper Body Dressing : Set up;Supervision/safety;Sitting   Lower Body Dressing: Moderate assistance   Toilet Transfer: Moderate assistance;Minimal assistance;+2 for safety/equipment;Ambulation;RW;BSC;Cueing for safety   Toileting-  Clothing Manipulation and Hygiene: Moderate assistance;Sit to/from stand       Functional mobility during ADLs: Moderate assistance;Minimal assistance;+2 for physical assistance;Cueing for safety;Rolling walker General ADL Comments: pt states that she feels that she is 50% back to her baseline with ADLs/selfccare, just feels weak and fatigues easily     Vision Baseline Vision/History: Wears glasses Patient Visual Report: No change from baseline       Perception     Praxis      Pertinent Vitals/Pain Pain Assessment: 0-10 Pain Score: 2  Faces Pain Scale: Hurts little more Pain Location: skin folds at abdomen and bil groin Pain Descriptors / Indicators: Stabbing Pain Intervention(s): Limited activity within patient's tolerance;Monitored during session;Repositioned     Hand Dominance Right   Extremity/Trunk Assessment Upper Extremity Assessment Upper Extremity Assessment: Generalized weakness   Lower Extremity Assessment Lower Extremity Assessment: Defer to PT evaluation RLE Deficits / Details: right leg with significant weakness, pt reports during MS flare this leg gets really weak, normally at baseline without a flare is weak as well, but not as weak as it is currently. Ankle DF 2-/5, knee extension 3-/5, hip NT RLE Sensation: history of peripheral neuropathy   Cervical / Trunk Assessment Cervical / Trunk Assessment: Other exceptions Cervical / Trunk Exceptions: Pt reports chronic back issues and pain due to multiple falls   Communication Communication Communication: No difficulties   Cognition Arousal/Alertness: Awake/alert Behavior During Therapy: WFL for tasks assessed/performed Overall Cognitive Status: Within Functional Limits for tasks assessed                                     General Comments  problem solved a few ideas on how pt is going to stay dry and use depends limited at discharge so her rash can heal.  We talked about using a home  purwick or possibly mentioning if a foley catheter (temporarily) would be beneficial to keep the moisture away (we did mention that foley might not be great as that is another source of infection).    Exercises     Shoulder Instructions      Home Living Family/patient expects to be discharged to:: Private residence Living Arrangements: Spouse/significant other Available Help at Discharge: Family;Available 24 hours/day Type of Home: House Home Access: Stairs to enter [employer] of Steps: 1 Entrance Stairs-Rails: None Home Layout: One level     Bathroom Shower/Tub: Occupational psychologist: Standard Bathroom Accessibility: Yes   Home Equipment: Environmental consultant - 4 wheels;Cane - single point;Shower seat - built in;Grab bars - tub/shower;Hand held shower head;Wheelchair - manual   Additional Comments: wears glasses all the time, bifocals      Prior Functioning/Environment Level of Independence: Needs assistance  Gait / Transfers Assistance Needed: used RW at home, cane and boyfriend for community access. ADL's / Homemaking Assistance Needed: normally no help bathing dressing toileting, shared household chores.   Comments: does not drive "as much as i used to", boyfriend.        OT Problem List: Decreased strength;Impaired balance (sitting and/or standing);Impaired tone;Pain;Decreased safety awareness;Decreased activity tolerance;Decreased coordination      OT Treatment/Interventions: Self-care/ADL training;Therapeutic exercise;Patient/family education;Balance training;Therapeutic activities;DME and/or AE instruction  OT Goals(Current goals can be found in the care plan section) Acute Rehab OT Goals Patient Stated Goal: to get stronger, heal this rash, control the moisture. OT Goal Formulation: With patient/family Time For Goal Achievement: 08/19/20 Potential to Achieve Goals: Good ADL Goals Pt Will Perform Grooming: with min guard assist;with supervision;with  set-up;standing Pt Will Perform Upper Body Bathing: with modified independence;sitting Pt Will Perform Lower Body Bathing: with min assist;with min guard assist;sit to/from stand Pt Will Perform Upper Body Dressing: with modified independence;sitting Pt Will Perform Lower Body Dressing: with min assist;with min guard assist;sitting/lateral leans;sit to/from stand Pt Will Transfer to Toilet: with min assist;with min guard assist;ambulating;regular height toilet;bedside commode;stand pivot transfer Pt Will Perform Toileting - Clothing Manipulation and hygiene: with min assist;with min guard assist;sit to/from stand  OT Frequency: Min 2X/week   Barriers to D/C:            Co-evaluation PT/OT/SLP Co-Evaluation/Treatment: Yes Reason for Co-Treatment: For patient/therapist safety;To address functional/ADL transfers;Complexity of the patient's impairments (multi-system involvement) PT goals addressed during session: Mobility/safety with mobility;Balance;Proper use of DME;Strengthening/ROM OT goals addressed during session: ADL's and self-care;Proper use of Adaptive equipment and DME      AM-PAC OT "6 Clicks" Daily Activity     Outcome Measure Help from another person eating meals?: None Help from another person taking care of personal grooming?: None Help from another person toileting, which includes using toliet, bedpan, or urinal?: A Lot Help from another person bathing (including washing, rinsing, drying)?: A Lot Help from another person to put on and taking off regular upper body clothing?: None Help from another person to put on and taking off regular lower body clothing?: A Lot 6 Click Score: 18   End of Session Equipment Utilized During Treatment: Gait belt;Rolling walker;Other (comment) (BSC)  Activity Tolerance: Patient limited by fatigue Patient left: in bed;with call bell/phone within reach;with family/visitor present  OT Visit Diagnosis: Unsteadiness on feet (R26.81);Other  abnormalities of gait and mobility (R26.89);Muscle weakness (generalized) (M62.81);History of falling (Z91.81);Pain;Other symptoms and signs involving the nervous system (R29.898) Pain - part of body:  (multipe areas of skin folds of pelvic and abdominal areas)                Time: 9935-7017 OT Time Calculation (min): 43 min Charges:  OT General Charges $OT Visit: 1 Visit OT Evaluation $OT Eval Moderate Complexity: 1 Mod    Britt Bottom 08/05/2020, 3:51 PM

## 2020-08-05 NOTE — Progress Notes (Deleted)
Pt continues to be very agitated and confused. Constantly trying to pull lines and tubes. Pt had a small urine incontinent episode. Bladder showed 392ml in the bladder. Made NP on call aware. Awaiting response.   

## 2020-08-06 DIAGNOSIS — L03311 Cellulitis of abdominal wall: Secondary | ICD-10-CM | POA: Diagnosis not present

## 2020-08-06 MED ORDER — ENSURE ENLIVE PO LIQD: 237.0000 mL | ORAL | 0 refills | Status: DC

## 2020-08-06 MED ORDER — HEPARIN SOD (PORK) LOCK FLUSH 100 UNIT/ML IV SOLN
500.0000 [IU] | INTRAVENOUS | Status: AC | PRN
Start: 2020-08-06 — End: 2020-08-06
  Administered 2020-08-06: 500 [IU]
  Filled 2020-08-06: qty 5

## 2020-08-06 MED ORDER — JUVEN PO PACK: 1.0000 | PACK | Freq: Two times a day (BID) | ORAL | 0 refills | Status: DC

## 2020-08-06 MED ORDER — FLUCONAZOLE 100 MG PO TABS: 100.0000 mg | ORAL_TABLET | Freq: Every day | ORAL | 0 refills | Status: AC

## 2020-08-06 MED ORDER — TRAMADOL HCL 50 MG PO TABS: 50.0000 mg | ORAL_TABLET | Freq: Two times a day (BID) | ORAL | 0 refills | Status: DC | PRN

## 2020-08-06 MED ORDER — CEPHALEXIN 500 MG PO CAPS: 500.0000 mg | ORAL_CAPSULE | Freq: Two times a day (BID) | ORAL | 0 refills | Status: AC

## 2020-08-06 MED ORDER — PROSOURCE PLUS PO LIQD: 30.0000 mL | Freq: Every day | ORAL | 0 refills | Status: DC

## 2020-08-06 MED ORDER — DOXYCYCLINE HYCLATE 100 MG PO TABS: 100.0000 mg | ORAL_TABLET | Freq: Two times a day (BID) | ORAL | 0 refills | Status: AC

## 2020-08-06 NOTE — Progress Notes (Signed)
Patient discharged to home with instructions and some dressing supplies. 

## 2020-08-06 NOTE — Progress Notes (Signed)
Occupational Therapy Treatment Patient Details Name: Amber Williams MRN: [id number] DOB: July 10, 1975 Today's Date: 08/06/2020    History of present illness 45 y.o. female admitted on 08/02/20 forprogressive wound/rash on abdomen. Dx with severe yeast infection of skin folds in groin, possible superimposed bacterial component.  WOC RN was consulted.  Pt with significant PMH of MS (since the age of 36), anal CA (s/p chemo, radiation and multible surgeries, PTSD, neurogenic bladder (frequent urination and incontinence), R foot drop, DVT, essential HTN, Gastric bypass (2010).   OT comments  Pt making good progress with functional goals. Pt states that she feels even better today ad that she feels like she is now 70% back to her baseline vs 50% from yesterday. OT will continue to follow acutely to maximize level of function and safety  Follow Up Recommendations  No OT follow up;Supervision - Intermittent    Equipment Recommendations  Other (comment) (home Purewick system)    Recommendations for Other Services      Precautions / Restrictions Precautions Precautions: Fall Precaution Comments: h/o falls Restrictions Weight Bearing Restrictions: No       Mobility Bed Mobility Overal bed mobility: Needs Assistance Bed Mobility: Supine to Sit     Supine to sit: Supervision;HOB elevated     General bed mobility comments: close Sup for safety    Transfers Overall transfer level: Needs assistance Equipment used: Rolling walker (2 wheeled) Transfers: Sit to/from [facility] Sit to Stand: Min assist;Min guard Stand pivot transfers: Min guard       General transfer comment: min A for initial stand from EOB, min guard A for SPT to [facility]    Balance Overall balance assessment: Needs assistance Sitting-balance support: Feet supported;No upper extremity supported;Single extremity supported Sitting balance-Leahy Scale: Good     Standing balance support: Single extremity  supported Standing balance-Leahy Scale: Poor                             ADL either performed or assessed with clinical judgement   ADL Overall ADL's : Needs assistance/impaired     Grooming: Wash/dry hands;Wash/dry face;Set up;Sitting       Lower Body Bathing: Minimal assistance;Sitting/lateral leans Lower Body Bathing Details (indicate cue type and reason): long sitting in bed     Lower Body Dressing: Minimal assistance Lower Body Dressing Details (indicate cue type and reason): to don socks long sitting Toilet Transfer: Minimal assistance;Min guard;RW;Stand-pivot;BSC   Toileting- Clothing Manipulation and Hygiene: Minimal assistance;Sit to/from stand               Vision Baseline Vision/History: Wears glasses Patient Visual Report: No change from baseline     Perception     Praxis      Cognition Arousal/Alertness: Awake/alert Behavior During Therapy: WFL for tasks assessed/performed Overall Cognitive Status: Within Functional Limits for tasks assessed                                          Exercises     Shoulder Instructions       General Comments      Pertinent Vitals/ Pain       Pain Assessment: 0-10 Pain Score: 3  Pain Location: skin folds at abdomen and bil groin Pain Descriptors / Indicators: Stabbing;Sore Pain Intervention(s): Monitored during session;Repositioned  Home Living  Prior Functioning/Environment              Frequency  Min 2X/week        Progress Toward Goals  OT Goals(current goals can now be found in the care plan section)  Progress towards OT goals: Progressing toward goals  Acute Rehab OT Goals Patient Stated Goal: to get stronger, heal this rash, control the moisture.  Plan Discharge plan remains appropriate    Co-evaluation                 AM-PAC OT "6 Clicks" Daily Activity     Outcome Measure   Help from  another person eating meals?: None Help from another person taking care of personal grooming?: None Help from another person toileting, which includes using toliet, bedpan, or urinal?: A Little Help from another person bathing (including washing, rinsing, drying)?: A Little Help from another person to put on and taking off regular upper body clothing?: None Help from another person to put on and taking off regular lower body clothing?: A Little 6 Click Score: 21    End of Session Equipment Utilized During Treatment: Gait belt;Rolling walker;Other (comment) (BSC)  OT Visit Diagnosis: Unsteadiness on feet (R26.81);Other abnormalities of gait and mobility (R26.89);Muscle weakness (generalized) (M62.81);History of falling (Z91.81);Pain;Other symptoms and signs involving the nervous system (R29.898) Pain - part of body:  (multiple skinfold areas of pelvis, abdomen)   Activity Tolerance Patient tolerated treatment well   Patient Left in bed;with call bell/phone within reach;Other (comment) (sitting EOB)   Nurse Communication          Time: 4975-3005 OT Time Calculation (min): 33 min  Charges: OT General Charges $OT Visit: 1 Visit OT Treatments $Self Care/Home Management : 8-22 mins $Therapeutic Activity: 8-22 mins     Emmit Alexanders [facility] 08/06/2020, 2:04 PM

## 2020-08-06 NOTE — TOC Progression Note (Addendum)
Transition of Care [facility]) - Progression Note    Patient Details  Name: FALLON HAECKER MRN: [id number] Date of Birth: [birth year]  Transition of Care [facility] CM/SW Contact  Jacalyn Lefevre Edson Snowball, RN Phone Number: 08/06/2020, 10:18 AM  Clinical Narrative:     Spoke to patient at bedside. Patient aware insurance does not cover purewick for home. Patient and her mother have already researched cost.   Patient has called her boyfriend to come pick her up from hospital today by noon.   Patient's mother is going to [employer] stores to see if she can find a "cheaper" pump for a purwick.   Patient open to a home health nurse . She has had [facility] in the past, if they are not available she has no preference.    Ramond Marrow with [facility] will submit referral to her office and let NCM know determination.     Kenzie with [facility] accepted referral for [facility] .   Expected Discharge Plan and Services     Discharge Planning Services: CM Consult   Living arrangements for the past 2 months: Single Family Home                                       Social Determinants of Health (SDOH) Interventions    Readmission Risk Interventions No flowsheet data found.

## 2020-08-07 DIAGNOSIS — K219 Gastro-esophageal reflux disease without esophagitis: Secondary | ICD-10-CM | POA: Diagnosis not present

## 2020-08-07 DIAGNOSIS — B372 Candidiasis of skin and nail: Secondary | ICD-10-CM | POA: Diagnosis not present

## 2020-08-07 DIAGNOSIS — E785 Hyperlipidemia, unspecified: Secondary | ICD-10-CM | POA: Diagnosis not present

## 2020-08-07 DIAGNOSIS — R69 Illness, unspecified: Secondary | ICD-10-CM | POA: Diagnosis not present

## 2020-08-07 DIAGNOSIS — I10 Essential (primary) hypertension: Secondary | ICD-10-CM | POA: Diagnosis not present

## 2020-08-07 DIAGNOSIS — M21371 Foot drop, right foot: Secondary | ICD-10-CM | POA: Diagnosis not present

## 2020-08-07 DIAGNOSIS — L03311 Cellulitis of abdominal wall: Secondary | ICD-10-CM | POA: Diagnosis not present

## 2020-08-07 DIAGNOSIS — N319 Neuromuscular dysfunction of bladder, unspecified: Secondary | ICD-10-CM | POA: Diagnosis not present

## 2020-08-07 DIAGNOSIS — R21 Rash and other nonspecific skin eruption: Secondary | ICD-10-CM | POA: Diagnosis not present

## 2020-08-07 DIAGNOSIS — G35 Multiple sclerosis: Secondary | ICD-10-CM | POA: Diagnosis not present

## 2020-08-07 LAB — CULTURE, BLOOD (ROUTINE X 2)
Culture: NO GROWTH
Culture: NO GROWTH

## 2020-08-07 NOTE — Discharge Summary (Signed)
[facility] Discharge Summary   Patient: Amber Williams YIF:[id number]  PCP: [name], MD  Date of admission: 08/02/2020   Date of discharge: 08/06/2020     Discharge Diagnoses:  Principal Problem:   Cellulitis of abdominal wall Active Problems:   Hypothyroidism   Multiple sclerosis ([place])   Neurogenic bladder   Candidal intertrigo   GERD (gastroesophageal reflux disease)   Admitted From: home Disposition:  Home home with home health  Recommendations for Outpatient Follow-up:  1. PCP: PCP in 1 week.  If no improvement in rash may require dermatology referral. 2. Follow up LABS/TEST: None 3. New Meds: Keflex, doxycycline, Diflucan 4. Changed meds: None 5. Stopped meds: Terbinafine   Follow-up Information    [facility] Follow up.   Contact information: [place] [phone]       [name], MD. Schedule an appointment as soon as possible for a visit in 1 week(s).   Specialty: Family Medicine Why: may need dermatology referral if no improvement.  Contact information: [place] [address] [phone]              Discharge Instructions    Diet general   Complete by: As directed    Discharge wound care:   Complete by: As directed    ONLY use Dermatherapy linen underpads. InterDry, DO NOT PLACE POWDERS, OINTMENTS, OR CREAMS where Phil Dopp is used. Using more than one pack, Place WIDE strips of InterDry into the abdominal and inguinal folds. Change daily and as needed.   Increase activity slowly   Complete by: As directed       Diet recommendation: Regular diet  Activity: The patient is advised to gradually reintroduce usual activities, as tolerated  Discharge Condition: stable  Code Status: Full code   History of present illness: As per the H and P dictated on admission, " Amber Williams is a 45 y.o. female with medical history significant for MS, GERD, hypothyroidism  who presents to the emergency department due to worsening rash within the last 4 days.  Patient has 3-4 months of fungal rash in lower quadrants and she has been following with wound clinic.  She states that she was recently prescribed terbinafine and after taking the medication, the rash worsened.  She complained of fevers (102.7 F about 2 days ago) and malaise over the last 4 days.  She decided to go to the ED for further evaluation due to difficulty in being able to take care of herself and being able to keep her wounds dry due to incontinence.  Rash was described as painful, burning, redness and weeping and it was of moderate severity.  She denied nausea, vomiting, chest pain or shortness of breath.  ED Course:  In the emergency department, she was tachypneic, but other vital signs were within normal range.  Work-up in the ED showed normal CBC and BMP except for mild hyponatremia and elevated MCV at 103.4.  CT abdomen and pelvis with contrast showed moderate stool burden throughout the colon with no acute findings in the abdominal pelvis. Chest x-ray showed no acute abnormalities. She was treated with IV ceftriaxone due to cellulitis, nystatin powder was ordered, patient was hydrated.  Hospitalist was asked to admit patient for further evaluation and management."  Hospital Course:  Summary of her active problems in the hospital is as following.   Severe yeast infection in skin folds of groin,  with possible super imposed bacterial  cellulitis Bilateral MASD related stage III buttocks injury, POA -WOC has seen patient, appreciate assistance  -she had been taking terbinafine as an outpatient without improvement -started on diflucan continue for total 10 days. -she is also on vancomycin, changed to oral doxycycline and Keflex continue for total 7 days. Blood cultures negative. Continue dressing changes and Interdry.  We will arrange home health. Stable lesions on repeat evaluation.  No indication  for additional topical treatment. If no improvement recommend outpatient dermatology referral.  Hypothyroidism -continue on synthroid  GERD -continue on PPI  Multiple sclerosis -she is on Ocrevus and is followed by neurology at [facility]  History of anal cancer -[facility]ed [facility] -she had chemo and radiation in the past  Unclear etiology of the maculopapular rash. Stable.  Likely secondary to fungal infection. Continue Diflucan.  Obesity. Placing the patient at high risk for poor outcome in the setting of worsening skin infection.  As well as pressure ulcers. Body mass index is 33.28 kg/m.   Patient was seen by physical therapy, who recommended home health was arranged. On the day of the discharge the patient's vitals were stable, and no other acute medical condition were reported by patient. The patient was felt safe to be discharge at Home with Home health.  Consultants: none Procedures: none  DISCHARGE MEDICATION: Allergies as of 08/06/2020      Reactions   Other Hives   Plastic Tape   Tecfidera [dimethyl Fumarate] Itching      Medication List    STOP taking these medications   terbinafine 250 MG tablet Commonly known as: LAMISIL     TAKE these medications   (feeding supplement) PROSource Plus liquid Take 30 mLs by mouth daily.   nutrition supplement (JUVEN) Pack Take 1 packet by mouth 2 (two) times daily between meals.   feeding supplement Liqd Take 237 mLs by mouth daily.   Allergy Relief 180 MG tablet Generic drug: fexofenadine Take 180 mg by mouth daily as needed for allergies.   Altavera 0.15-30 MG-MCG tablet Generic drug: levonorgestrel-ethinyl estradiol TAKE 1 TABLET BY MOUTH EVERY DAY What changed: when to take this   amitriptyline 50 MG tablet Commonly known as: ELAVIL Take 1 tablet (50 mg total) by mouth at bedtime.   aspirin 81 MG EC tablet Take 81 mg by mouth at bedtime.   cephALEXin 500 MG capsule Commonly known as:  KEFLEX Take 1 capsule (500 mg total) by mouth 2 (two) times daily for 6 days.   Cyanocobalamin 5000 MCG Tbdp Take 5,000 mcg by mouth daily.   doxycycline 100 MG tablet Commonly known as: VIBRA-TABS Take 1 tablet (100 mg total) by mouth 2 (two) times daily for 6 days.   escitalopram 10 MG tablet Commonly known as: LEXAPRO Take 1 tablet (10 mg total) by mouth daily.   fluconazole 100 MG tablet Commonly known as: DIFLUCAN Take 1 tablet (100 mg total) by mouth daily for 9 days.   flurazepam 30 MG capsule Commonly known as: DALMANE Take 1 capsule (30 mg total) by mouth at bedtime.   levothyroxine 112 MCG tablet Commonly known as: SYNTHROID TAKE 1 TABLET DAILY BEFORE BREAKFAST What changed:   how much to take  how to take this  when to take this  additional instructions   multivitamin with minerals tablet Take 1 tablet by mouth daily. One a day woman   NyQuil Severe Cold/Flu 5-6.25-10-325 MG/15ML Liqd Generic drug: Phenyleph-Doxylamine-DM-APAP Take 30 mLs by mouth at bedtime as needed (cold/cough).  nystatin powder Commonly known as: MYCOSTATIN/NYSTOP Apply 1 application topically in the morning and at bedtime.   omeprazole 20 MG capsule Commonly known as: PRILOSEC Take 1 capsule (20 mg total) by mouth daily.   traMADol 50 MG tablet Commonly known as: ULTRAM Take 1 tablet (50 mg total) by mouth every 12 (twelve) hours as needed for moderate pain or severe pain.   traZODone 150 MG tablet Commonly known as: DESYREL USE FROM 1/3 TO 1 TABLET ASNEEDED FOR SLEEP What changed:   how much to take  how to take this  when to take this  additional instructions   Vitamin D3 125 MCG (5000 UT) Caps Take 5,000 Units by mouth every morning.            Discharge Care Instructions  (From admission, onward)         Start     Ordered   08/06/20 0000  Discharge wound care:       Comments: ONLY use Dermatherapy linen underpads. InterDry, DO NOT PLACE POWDERS,  OINTMENTS, OR CREAMS where Phil Dopp is used. Using more than one pack, Place WIDE strips of InterDry into the abdominal and inguinal folds. Change daily and as needed.   08/06/20 1049          Discharge Exam: Filed Weights   08/02/20 2357  Weight: 90.7 kg   Vitals:   08/06/20 0535 08/06/20 1212  BP: (!) 102/59 (!) 105/52  Pulse: 84 100  Resp: 18   Temp: (!) 97.3 F (36.3 C) 97.7 F (36.5 C)  SpO2: 94% 96%   General: Appear in no distress, sporadic maculopapular rash, redness and erythema of the all intertriginous area; Oral Mucosa Clear, moist. no Abnormal Neck Mass Or lumps, Conjunctiva normal  Cardiovascular: S1 and S2 Present, no Murmur Respiratory: good respiratory effort, Bilateral Air entry present and CTA, no Crackles, no wheezes Abdomen: Bowel Sound present, Soft and no tenderness Extremities: no Pedal edema Neurology: alert and oriented to time, place, and person affect appropriate. no new focal deficit  The results of significant diagnostics from this hospitalization (including imaging, microbiology, ancillary and laboratory) are listed below for reference.    Significant Diagnostic Studies: DG Chest 2 View  Result Date: 08/02/2020 CLINICAL DATA:  Nonproductive cough for 5 days, has a fungal infection EXAM: CHEST - 2 VIEW COMPARISON:  01/23/2020 FINDINGS: RIGHT jugular Port-A-Cath with tip projecting over cavoatrial junction. Normal heart size, mediastinal contours, and pulmonary vascularity. Lungs clear. No acute infiltrate, pleural effusion, or pneumothorax. Osseous structures unremarkable. IMPRESSION: No acute abnormalities. Electronically Signed   By: Lavonia Dana M.D.   On: 08/02/2020 13:39   CT ABDOMEN PELVIS W CONTRAST  Result Date: 08/02/2020 CLINICAL DATA:  Abdominal pain EXAM: CT ABDOMEN AND PELVIS WITH CONTRAST TECHNIQUE: Multidetector CT imaging of the abdomen and pelvis was performed using the standard protocol following bolus administration of intravenous  contrast. CONTRAST:  124mL OMNIPAQUE IOHEXOL 300 MG/ML  SOLN COMPARISON:  08/12/2019 FINDINGS: Lower chest: Elevation of the left hemidiaphragm, stable. No acute abnormality. Hepatobiliary: No focal hepatic abnormality. Gallbladder unremarkable. Pancreas: No focal abnormality or ductal dilatation. Spleen: No focal abnormality.  Normal size. Adrenals/Urinary Tract: No adrenal abnormality. No focal renal abnormality. No stones or hydronephrosis. Urinary bladder is unremarkable. Stomach/Bowel: Moderate stool burden throughout the colon. Prior gastric bypass. No bowel obstruction. Vascular/Lymphatic: Scattered aortic atherosclerosis. No evidence of aneurysm or adenopathy. Reproductive: Uterus and adnexa unremarkable.  No mass. Other: No free fluid or free air. Musculoskeletal: No acute bony  abnormality. IMPRESSION: Moderate stool burden throughout the colon. No acute findings in the abdomen or pelvis. Electronically Signed   By: Rolm Baptise M.D.   On: 08/02/2020 20:31    Microbiology: Recent Results (from the past 240 hour(s))  Culture, blood (routine x 2)     Status: None (Preliminary result)   Collection Time: 08/02/20  2:35 PM   Specimen: BLOOD RIGHT WRIST  Result Value Ref Range Status   Specimen Description BLOOD RIGHT WRIST  Final   Special Requests   Final    BOTTLES DRAWN AEROBIC AND ANAEROBIC Blood Culture results may not be optimal due to an inadequate volume of blood received in culture bottles   Culture   Final    NO GROWTH 4 DAYS Performed at [facility], [place] [address] [place], [place] [address]    Report Status PENDING  Incomplete  Culture, blood (routine x 2)     Status: None (Preliminary result)   Collection Time: 08/02/20  4:15 PM   Specimen: BLOOD  Result Value Ref Range Status   Specimen Description BLOOD LEFT ANTECUBITAL  Final   Special Requests   Final    BOTTLES DRAWN AEROBIC AND ANAEROBIC Blood Culture results may not be optimal due to an inadequate volume of  blood received in culture bottles   Culture   Final    NO GROWTH 4 DAYS Performed at [facility], [place] [address] [place], [place] [address]    Report Status PENDING  Incomplete  SARS CORONAVIRUS 2 (TAT 6-24 HRS) Nasopharyngeal Nasopharyngeal Swab     Status: None   Collection Time: 08/03/20 12:06 AM   Specimen: Nasopharyngeal Swab  Result Value Ref Range Status   SARS Coronavirus 2 NEGATIVE NEGATIVE Final    Comment: (NOTE) SARS-CoV-2 target nucleic acids are NOT DETECTED.  The SARS-CoV-2 RNA is generally detectable in upper and lower respiratory specimens during the acute phase of infection. Negative results do not preclude SARS-CoV-2 infection, do not rule out co-infections with other pathogens, and should not be used as the sole basis for treatment or other patient management decisions. Negative results must be combined with clinical observations, patient history, and epidemiological information. The expected result is Negative.  Fact Sheet for Patients: SugarRoll.be  Fact Sheet for Healthcare Providers: https://www.woods-mathews.com/  This test is not yet approved or cleared by the [place] FDA and  has been authorized for detection and/or diagnosis of SARS-CoV-2 by FDA under an Emergency Use Authorization (EUA). This EUA will remain  in effect (meaning this test can be used) for the duration of the COVID-19 declaration under Se ction 564(b)(1) of the Act, 21 U.S.C. section 360bbb-3(b)(1), unless the authorization is terminated or revoked sooner.  Performed at [facility], [place] [address] [place], [place] [address]      Labs: CBC: Recent Labs  Lab 08/02/20 1625 08/03/20 0253 08/04/20 0400  WBC 6.7 6.6 3.9*  NEUTROABS 5.5  --   --   HGB 12.6 11.8* 10.9*  HCT 39.7 37.0 33.9*  MCV 103.4* 101.1* 99.7  PLT 225 278 389   Basic Metabolic Panel: Recent Labs  Lab 08/02/20 1625 08/03/20 0253  08/04/20 0400  NA 134* 136 139  K 4.1 4.0 3.7  CL 103 105 109  CO2 21* 26 27  GLUCOSE 84 89 82  BUN 9 7 5*  CREATININE 0.85 0.89 0.76  CALCIUM 9.4 9.3 8.8*  MG  --  1.9  --   PHOS  --  2.1* 2.8   Liver Function Tests: Recent Labs  Lab 08/02/20 1625 08/03/20 0253 08/04/20 0400  AST 16 13*  --   ALT 15 14  --   ALKPHOS 59 57  --   BILITOT 0.5 0.3  --   PROT 5.2* 4.9*  --   ALBUMIN 2.8* 2.7* 2.1*   CBG: No results for input(s): GLUCAP in the last 168 hours.  Time spent: 35 minutes  Signed:  Berle Mull  [facility] 08/06/2020 8:13 AM

## 2020-08-09 ENCOUNTER — Telehealth: Payer: Self-pay

## 2020-08-09 NOTE — Telephone Encounter (Signed)
Appt made for Thurs 08/12/20

## 2020-08-09 NOTE — Telephone Encounter (Signed)
.  Transition Care Management Follow-up Telephone Call  Date of discharge and from where: 08/06/20 from [facility]  How have you been since you were released from the hospital? Still weak and sore, having a hard time getting around because her MS is flared up.  Any questions or concerns? Yes - She has had to spend about $400 paying for ensure, depends, and other things the hospital doctor advised her to get - she asks is there any assistance available for these things? Maybe we can send a note to CCM to see if they can help?  Items Reviewed:  Did the pt receive and understand the discharge instructions provided? Yes   Medications obtained and verified? Yes   Other? No   Any new allergies since your discharge? No   Dietary orders reviewed? Yes  Do you have support at home? Yes   Home Care and Equipment/Supplies: Were home health services ordered? yes If so, what is the name of the agency? [facility]  Has the agency set up a time to come to the patient's home? yes Were any new equipment or medical supplies ordered?  No  What is the name of the medical supply agency? N/a Were you able to get the supplies/equipment? Yes - she used her credit cards to get depends, ensure and other necessities. Do you have any questions related to the use of the equipment or supplies? No  Functional Questionnaire: (I = Independent and D = Dependent) ADLs: I - with assistance  Bathing/Dressing- I - with assistance  Meal Prep- D - cannot stand long enough  Eating- I  Maintaining continence- D - some control during the day, none at night  Transferring/Ambulation- D - uses rollator walker and can only walk short distances.  Managing Meds- I  Follow up appointments reviewed:   PCP Hospital f/u appt confirmed? Yes  Scheduled to see Marjorie Smolder on 08/12/2020 @ 1:00.  [facility] f/u appt confirmed? No    Are transportation arrangements needed? No   If their condition worsens,  is the pt aware to call PCP or go to the Emergency Dept.? Yes  Was the patient provided with contact information for the PCP's office or ED? Yes  Was to pt encouraged to call back with questions or concerns? Yes

## 2020-08-10 ENCOUNTER — Ambulatory Visit: Payer: Medicare HMO | Admitting: *Deleted

## 2020-08-10 DIAGNOSIS — G35 Multiple sclerosis: Secondary | ICD-10-CM

## 2020-08-10 DIAGNOSIS — I10 Essential (primary) hypertension: Secondary | ICD-10-CM | POA: Diagnosis not present

## 2020-08-10 DIAGNOSIS — F324 Major depressive disorder, single episode, in partial remission: Secondary | ICD-10-CM

## 2020-08-10 DIAGNOSIS — B372 Candidiasis of skin and nail: Secondary | ICD-10-CM | POA: Diagnosis not present

## 2020-08-10 DIAGNOSIS — R69 Illness, unspecified: Secondary | ICD-10-CM | POA: Diagnosis not present

## 2020-08-10 DIAGNOSIS — E785 Hyperlipidemia, unspecified: Secondary | ICD-10-CM | POA: Diagnosis not present

## 2020-08-10 DIAGNOSIS — N319 Neuromuscular dysfunction of bladder, unspecified: Secondary | ICD-10-CM | POA: Diagnosis not present

## 2020-08-10 DIAGNOSIS — K219 Gastro-esophageal reflux disease without esophagitis: Secondary | ICD-10-CM | POA: Diagnosis not present

## 2020-08-10 DIAGNOSIS — L03311 Cellulitis of abdominal wall: Secondary | ICD-10-CM | POA: Diagnosis not present

## 2020-08-10 DIAGNOSIS — L98491 Non-pressure chronic ulcer of skin of other sites limited to breakdown of skin: Secondary | ICD-10-CM

## 2020-08-10 DIAGNOSIS — M21371 Foot drop, right foot: Secondary | ICD-10-CM | POA: Diagnosis not present

## 2020-08-10 DIAGNOSIS — R21 Rash and other nonspecific skin eruption: Secondary | ICD-10-CM | POA: Diagnosis not present

## 2020-08-11 ENCOUNTER — Inpatient Hospital Stay (HOSPITAL_COMMUNITY): Payer: Medicare HMO | Attending: Hematology

## 2020-08-11 ENCOUNTER — Other Ambulatory Visit: Payer: Self-pay

## 2020-08-11 DIAGNOSIS — Z803 Family history of malignant neoplasm of breast: Secondary | ICD-10-CM | POA: Insufficient documentation

## 2020-08-11 DIAGNOSIS — C210 Malignant neoplasm of anus, unspecified: Secondary | ICD-10-CM | POA: Insufficient documentation

## 2020-08-11 DIAGNOSIS — G35 Multiple sclerosis: Secondary | ICD-10-CM | POA: Diagnosis not present

## 2020-08-11 LAB — CBC WITH DIFFERENTIAL/PLATELET
Abs Immature Granulocytes: 0.05 10*3/uL (ref 0.00–0.07)
Basophils Absolute: 0.1 10*3/uL (ref 0.0–0.1)
Basophils Relative: 1 %
Eosinophils Absolute: 0.2 10*3/uL (ref 0.0–0.5)
Eosinophils Relative: 3 %
HCT: 40.9 % (ref 36.0–46.0)
Hemoglobin: 12.8 g/dL (ref 12.0–15.0)
Immature Granulocytes: 1 %
Lymphocytes Relative: 13 %
Lymphs Abs: 1 10*3/uL (ref 0.7–4.0)
MCH: 32.3 pg (ref 26.0–34.0)
MCHC: 31.3 g/dL (ref 30.0–36.0)
MCV: 103.3 fL — ABNORMAL HIGH (ref 80.0–100.0)
Monocytes Absolute: 0.4 10*3/uL (ref 0.1–1.0)
Monocytes Relative: 5 %
Neutro Abs: 6.3 10*3/uL (ref 1.7–7.7)
Neutrophils Relative %: 77 %
Platelets: 525 10*3/uL — ABNORMAL HIGH (ref 150–400)
RBC: 3.96 MIL/uL (ref 3.87–5.11)
RDW: 13.4 % (ref 11.5–15.5)
WBC: 8 10*3/uL (ref 4.0–10.5)
nRBC: 0 % (ref 0.0–0.2)

## 2020-08-11 LAB — COMPREHENSIVE METABOLIC PANEL
ALT: 23 U/L (ref 0–44)
AST: 23 U/L (ref 15–41)
Albumin: 3.6 g/dL (ref 3.5–5.0)
Alkaline Phosphatase: 64 U/L (ref 38–126)
Anion gap: 11 (ref 5–15)
BUN: 30 mg/dL — ABNORMAL HIGH (ref 6–20)
CO2: 23 mmol/L (ref 22–32)
Calcium: 9.8 mg/dL (ref 8.9–10.3)
Chloride: 104 mmol/L (ref 98–111)
Creatinine, Ser: 1.08 mg/dL — ABNORMAL HIGH (ref 0.44–1.00)
GFR, Estimated: >60 mL/min (ref >60)
Glucose, Bld: 129 mg/dL — ABNORMAL HIGH (ref 70–99)
Potassium: 3.8 mmol/L (ref 3.5–5.1)
Sodium: 138 mmol/L (ref 135–145)
Total Bilirubin: 0.4 mg/dL (ref 0.3–1.2)
Total Protein: 6.1 g/dL — ABNORMAL LOW (ref 6.5–8.1)

## 2020-08-12 ENCOUNTER — Inpatient Hospital Stay: Payer: Medicare HMO | Admitting: Family Medicine

## 2020-08-12 DIAGNOSIS — L03311 Cellulitis of abdominal wall: Secondary | ICD-10-CM | POA: Diagnosis not present

## 2020-08-12 DIAGNOSIS — R21 Rash and other nonspecific skin eruption: Secondary | ICD-10-CM | POA: Diagnosis not present

## 2020-08-12 DIAGNOSIS — M21371 Foot drop, right foot: Secondary | ICD-10-CM | POA: Diagnosis not present

## 2020-08-12 DIAGNOSIS — B372 Candidiasis of skin and nail: Secondary | ICD-10-CM | POA: Diagnosis not present

## 2020-08-12 DIAGNOSIS — R69 Illness, unspecified: Secondary | ICD-10-CM | POA: Diagnosis not present

## 2020-08-12 DIAGNOSIS — K219 Gastro-esophageal reflux disease without esophagitis: Secondary | ICD-10-CM | POA: Diagnosis not present

## 2020-08-12 DIAGNOSIS — N319 Neuromuscular dysfunction of bladder, unspecified: Secondary | ICD-10-CM | POA: Diagnosis not present

## 2020-08-12 DIAGNOSIS — E785 Hyperlipidemia, unspecified: Secondary | ICD-10-CM | POA: Diagnosis not present

## 2020-08-12 DIAGNOSIS — G35 Multiple sclerosis: Secondary | ICD-10-CM | POA: Diagnosis not present

## 2020-08-12 DIAGNOSIS — I10 Essential (primary) hypertension: Secondary | ICD-10-CM | POA: Diagnosis not present

## 2020-08-16 DIAGNOSIS — R21 Rash and other nonspecific skin eruption: Secondary | ICD-10-CM | POA: Diagnosis not present

## 2020-08-16 DIAGNOSIS — E785 Hyperlipidemia, unspecified: Secondary | ICD-10-CM | POA: Diagnosis not present

## 2020-08-16 DIAGNOSIS — G35 Multiple sclerosis: Secondary | ICD-10-CM | POA: Diagnosis not present

## 2020-08-16 DIAGNOSIS — N319 Neuromuscular dysfunction of bladder, unspecified: Secondary | ICD-10-CM | POA: Diagnosis not present

## 2020-08-16 DIAGNOSIS — R69 Illness, unspecified: Secondary | ICD-10-CM | POA: Diagnosis not present

## 2020-08-16 DIAGNOSIS — I10 Essential (primary) hypertension: Secondary | ICD-10-CM | POA: Diagnosis not present

## 2020-08-16 DIAGNOSIS — M21371 Foot drop, right foot: Secondary | ICD-10-CM | POA: Diagnosis not present

## 2020-08-16 DIAGNOSIS — L03311 Cellulitis of abdominal wall: Secondary | ICD-10-CM | POA: Diagnosis not present

## 2020-08-16 DIAGNOSIS — B372 Candidiasis of skin and nail: Secondary | ICD-10-CM | POA: Diagnosis not present

## 2020-08-16 DIAGNOSIS — K219 Gastro-esophageal reflux disease without esophagitis: Secondary | ICD-10-CM | POA: Diagnosis not present

## 2020-08-17 ENCOUNTER — Ambulatory Visit (INDEPENDENT_AMBULATORY_CARE_PROVIDER_SITE_OTHER): Payer: Medicare HMO | Admitting: Family Medicine

## 2020-08-17 ENCOUNTER — Encounter: Payer: Self-pay | Admitting: Family Medicine

## 2020-08-17 ENCOUNTER — Other Ambulatory Visit: Payer: Self-pay

## 2020-08-17 VITALS — BP 83/43 | HR 93 | Temp 97.5°F | Ht 65.0 in | Wt 203.4 lb

## 2020-08-17 DIAGNOSIS — B372 Candidiasis of skin and nail: Secondary | ICD-10-CM | POA: Diagnosis not present

## 2020-08-17 DIAGNOSIS — L03311 Cellulitis of abdominal wall: Secondary | ICD-10-CM | POA: Diagnosis not present

## 2020-08-17 DIAGNOSIS — G35 Multiple sclerosis: Secondary | ICD-10-CM

## 2020-08-17 MED ORDER — ECONAZOLE NITRATE 1 % EX CREA
TOPICAL_CREAM | Freq: Two times a day (BID) | CUTANEOUS | 5 refills | Status: DC
Start: 2020-08-17 — End: 2020-08-30

## 2020-08-17 MED ORDER — TERBINAFINE HCL 250 MG PO TABS: 250.0000 mg | ORAL_TABLET | Freq: Every day | ORAL | 0 refills | Status: DC

## 2020-08-17 NOTE — Progress Notes (Signed)
Subjective:  Patient ID: Amber Williams, female    DOB: [birth year]  Age: 45 y.o. MRN: [id number]  CC: Hospitalization Follow-up   HPI Amber Williams presents for hospitalization for a cellulitis of the abdominal wall.  Got out of control and she had to be admitted.  She had been sent to the wound center and.  The type of dressings that she was using had enough moisture that it exacerbated the fungus abdominal folds.  She was in so much pain when she went to the hospital that she had to be admitted.  She was placed on IV antibiotics and also antifungal medicine as well.  She is doing much better now.  She is done with antibiotics and still have some rash.  Depression screen [facility] 2/9 08/17/2020 06/01/2020 05/19/2020  Decreased Interest 0 0 0  Down, Depressed, Hopeless 0 0 0  PHQ - 2 Score 0 0 0  Altered sleeping - - -  Tired, decreased energy - - -  Change in appetite - - -  Feeling bad or failure about yourself  - - -  Trouble concentrating - - -  Moving slowly or fidgety/restless - - -  Suicidal thoughts - - -  PHQ-9 Score - - -  Difficult doing work/chores - - -  Some recent data might be hidden    History Amber Williams has a past medical history of Anal cancer ([place]) (01/10/2019), Arthritis, Depression, DVT (deep venous thrombosis) ([place]) (2013), Essential hypertension, Family history of breast cancer, Gait abnormality (08/03/2016), GERD (gastroesophageal reflux disease) (08/03/2020), History of blood transfusion (2013), Hyperlipidemia, Hypothyroidism, Hypothyroidism, Multiple sclerosis ([place]), Neurogenic bladder (08/03/2016), Port-A-Cath in place (01/23/2019), PTSD (post-traumatic stress disorder), PTSD (post-traumatic stress disorder), Right foot drop (08/03/2016), Seasonal allergies, and Ulcer.   She has a past surgical history that includes Colon surgery; Gastric bypass (2010); Interstim Implant placement (2014); Portacath placement; Hemorrhoid surgery (N/A, 12/25/2018); Flexible sigmoidoscopy (N/A,  06/25/2019); Colonoscopy with propofol (N/A, 03/04/2020); and Colonoscopy (N/A, 07/06/2020).   Her family history includes Breast cancer (age of onset: 25) in her sister; Breast cancer (age of onset: 43) in her mother; Cancer in her paternal grandmother; Diabetes in her father, maternal grandmother, and paternal grandmother; Heart disease in her maternal grandfather, maternal grandmother, and paternal grandfather; Hypertension in her father and sister; Miscarriages / Stillbirths in her sister; Stroke in her paternal grandfather.She reports that she quit smoking about 11 years ago. Her smoking use included cigarettes. She started smoking about 29 years ago. She has a 22.50 pack-year smoking history. She has never used smokeless tobacco. She reports previous alcohol use. She reports that she does not use drugs.    ROS Review of Systems  Constitutional: Negative.   HENT: Negative.   Eyes: Negative for visual disturbance.  Respiratory: Negative for shortness of breath.   Cardiovascular: Negative for chest pain.  Gastrointestinal: Negative for abdominal pain.  Musculoskeletal: Negative for arthralgias.  Skin: Positive for rash.    Objective:  BP (!) 83/43   Pulse 93   Temp (!) 97.5 F (36.4 C)   Ht 5\' 5"  (1.651 m)   Wt 203 lb 6.4 oz (92.3 kg)   SpO2 100%   BMI 33.85 kg/m   BP Readings from Last 3 Encounters:  08/17/20 (!) 83/43  08/06/20 (!) 105/52  07/06/20 119/66    Wt Readings from Last 3 Encounters:  08/17/20 203 lb 6.4 oz (92.3 kg)  08/02/20 200 lb (90.7 kg)  07/06/20 200 lb (90.7 kg)  Physical Exam Constitutional:      General: She is not in acute distress.    Appearance: She is well-developed.  Cardiovascular:     Rate and Rhythm: Normal rate and regular rhythm.  Pulmonary:     Breath sounds: Normal breath sounds.  Skin:    General: Skin is warm and dry.     Findings: Rash (The fold underneath the breasts and in the mid to lower abdomen and in the suprapubic area  all have intertrigo and has rash consistent with fungus.  There is some denuded synovium and maceration as well and has satellite formation.) present.  Neurological:     Mental Status: She is alert and oriented to person, place, and time.       Assessment & Plan:   Amber Williams was seen today for hospitalization follow-up.  Diagnoses and all orders for this visit:  Cellulitis of abdominal wall  Candidal intertrigo  Multiple sclerosis ([place])  Other orders -     terbinafine (LAMISIL) 250 MG tablet; Take 1 tablet (250 mg total) by mouth daily. -     econazole nitrate 1 % cream; Apply topically 2 (two) times daily. To affected areas       I am having Amber Williams start on terbinafine and econazole nitrate. [name] Vitamin D3, Cyanocobalamin, Allergy Relief, traZODone, aspirin, escitalopram, levothyroxine, Altavera, omeprazole, flurazepam, multivitamin with minerals, amitriptyline, nystatin, NyQuil Severe Cold/Flu, (feeding supplement) PROSource Plus, feeding supplement, nutrition supplement (JUVEN), and traMADol.  Allergies as of 08/17/2020      Reactions   Other Hives   Plastic Tape   Tecfidera [dimethyl Fumarate] Itching      Medication List       Accurate as of August 17, 2020  7:38 PM. If you have any questions, ask your nurse or doctor.        (feeding supplement) PROSource Plus liquid Take 30 mLs by mouth daily.   nutrition supplement (JUVEN) Pack Take 1 packet by mouth 2 (two) times daily between meals.   feeding supplement Liqd Take 237 mLs by mouth daily.   Allergy Relief 180 MG tablet Generic drug: fexofenadine Take 180 mg by mouth daily as needed for allergies.   Altavera 0.15-30 MG-MCG tablet Generic drug: levonorgestrel-ethinyl estradiol TAKE 1 TABLET BY MOUTH EVERY DAY What changed: when to take this   amitriptyline 50 MG tablet Commonly known as: ELAVIL Take 1 tablet (50 mg total) by mouth at bedtime.   aspirin 81 MG  EC tablet Take 81 mg by mouth at bedtime.   Cyanocobalamin 5000 MCG Tbdp Take 5,000 mcg by mouth daily.   econazole nitrate 1 % cream Apply topically 2 (two) times daily. To affected areas Started by: [name], MD   escitalopram 10 MG tablet Commonly known as: LEXAPRO Take 1 tablet (10 mg total) by mouth daily.   flurazepam 30 MG capsule Commonly known as: DALMANE Take 1 capsule (30 mg total) by mouth at bedtime.   levothyroxine 112 MCG tablet Commonly known as: SYNTHROID TAKE 1 TABLET DAILY BEFORE BREAKFAST What changed:   how much to take  how to take this  when to take this  additional instructions   multivitamin with minerals tablet Take 1 tablet by mouth daily. One a day woman   NyQuil Severe Cold/Flu 5-6.25-10-325 MG/15ML Liqd Generic drug: Phenyleph-Doxylamine-DM-APAP Take 30 mLs by mouth at bedtime as needed (cold/cough).   nystatin powder Commonly known as: MYCOSTATIN/NYSTOP Apply 1 application topically in the  morning and at bedtime.   omeprazole 20 MG capsule Commonly known as: PRILOSEC Take 1 capsule (20 mg total) by mouth daily.   terbinafine 250 MG tablet Commonly known as: LamISIL Take 1 tablet (250 mg total) by mouth daily. Started by: [name], MD   traMADol 50 MG tablet Commonly known as: ULTRAM Take 1 tablet (50 mg total) by mouth every 12 (twelve) hours as needed for moderate pain or severe pain.   traZODone 150 MG tablet Commonly known as: DESYREL USE FROM 1/3 TO 1 TABLET ASNEEDED FOR SLEEP What changed:   how much to take  how to take this  when to take this  additional instructions   Vitamin D3 125 MCG (5000 UT) Caps Take 5,000 Units by mouth every morning.        Follow-up: Return in about 2 weeks (around 08/31/2020).  [name], M.D.

## 2020-08-18 ENCOUNTER — Inpatient Hospital Stay (HOSPITAL_BASED_OUTPATIENT_CLINIC_OR_DEPARTMENT_OTHER): Payer: Medicare HMO | Admitting: Hematology

## 2020-08-18 ENCOUNTER — Encounter (HOSPITAL_COMMUNITY): Payer: Self-pay | Admitting: Hematology

## 2020-08-18 VITALS — BP 110/75 | HR 86 | Temp 97.0°F | Resp 17 | Wt 200.0 lb

## 2020-08-18 DIAGNOSIS — C210 Malignant neoplasm of anus, unspecified: Secondary | ICD-10-CM

## 2020-08-18 DIAGNOSIS — Z803 Family history of malignant neoplasm of breast: Secondary | ICD-10-CM | POA: Diagnosis not present

## 2020-08-18 DIAGNOSIS — G35 Multiple sclerosis: Secondary | ICD-10-CM | POA: Diagnosis not present

## 2020-08-18 NOTE — Patient Instructions (Addendum)
[place] at [facility] Discharge Instructions  You were seen today by Dr. Delton Coombes. He went over your recent results and scans. Drink 1 to 2 liters of water daily to keep your kidneys flushed. Dr. Delton Coombes will see you back in 6 months for labs and follow up.   Thank you for choosing [place] at [facility] to provide your oncology and hematology care.  To afford each patient quality time with our provider, please arrive at least 15 minutes before your scheduled appointment time.   If you have a lab appointment with the [place] please come in thru the Main Entrance and check in at the main information desk  You need to re-schedule your appointment should you arrive 10 or more minutes late.  We strive to give you quality time with our providers, and arriving late affects you and other patients whose appointments are after yours.  Also, if you no show three or more times for appointments you may be dismissed from the clinic at the providers discretion.     Again, thank you for choosing [facility].  Our hope is that these requests will decrease the amount of time that you wait before being seen by our physicians.       _____________________________________________________________  Should you have questions after your visit to [facility], please contact our office at [phone] between the hours of 8:00 a.m. and 4:30 p.m.  Voicemails left after 4:00 p.m. will not be returned until the following business day.  For prescription refill requests, have your pharmacy contact our office and allow 72 hours.    Cancer Center Support Programs:   > Cancer Support Group  2nd Tuesday of the month 1pm-2pm, Journey Room
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
## 2020-08-19 DIAGNOSIS — M21371 Foot drop, right foot: Secondary | ICD-10-CM | POA: Diagnosis not present

## 2020-08-19 DIAGNOSIS — E785 Hyperlipidemia, unspecified: Secondary | ICD-10-CM | POA: Diagnosis not present

## 2020-08-19 DIAGNOSIS — R21 Rash and other nonspecific skin eruption: Secondary | ICD-10-CM | POA: Diagnosis not present

## 2020-08-19 DIAGNOSIS — K219 Gastro-esophageal reflux disease without esophagitis: Secondary | ICD-10-CM | POA: Diagnosis not present

## 2020-08-19 DIAGNOSIS — N319 Neuromuscular dysfunction of bladder, unspecified: Secondary | ICD-10-CM | POA: Diagnosis not present

## 2020-08-19 DIAGNOSIS — R69 Illness, unspecified: Secondary | ICD-10-CM | POA: Diagnosis not present

## 2020-08-19 DIAGNOSIS — G35 Multiple sclerosis: Secondary | ICD-10-CM | POA: Diagnosis not present

## 2020-08-19 DIAGNOSIS — I10 Essential (primary) hypertension: Secondary | ICD-10-CM | POA: Diagnosis not present

## 2020-08-19 DIAGNOSIS — L03311 Cellulitis of abdominal wall: Secondary | ICD-10-CM | POA: Diagnosis not present

## 2020-08-19 DIAGNOSIS — B372 Candidiasis of skin and nail: Secondary | ICD-10-CM | POA: Diagnosis not present

## 2020-08-23 DIAGNOSIS — G35 Multiple sclerosis: Secondary | ICD-10-CM | POA: Diagnosis not present

## 2020-08-23 DIAGNOSIS — E785 Hyperlipidemia, unspecified: Secondary | ICD-10-CM | POA: Diagnosis not present

## 2020-08-23 DIAGNOSIS — K219 Gastro-esophageal reflux disease without esophagitis: Secondary | ICD-10-CM | POA: Diagnosis not present

## 2020-08-23 DIAGNOSIS — L03311 Cellulitis of abdominal wall: Secondary | ICD-10-CM | POA: Diagnosis not present

## 2020-08-23 DIAGNOSIS — R21 Rash and other nonspecific skin eruption: Secondary | ICD-10-CM | POA: Diagnosis not present

## 2020-08-23 DIAGNOSIS — N319 Neuromuscular dysfunction of bladder, unspecified: Secondary | ICD-10-CM | POA: Diagnosis not present

## 2020-08-23 DIAGNOSIS — I10 Essential (primary) hypertension: Secondary | ICD-10-CM | POA: Diagnosis not present

## 2020-08-23 DIAGNOSIS — M21371 Foot drop, right foot: Secondary | ICD-10-CM | POA: Diagnosis not present

## 2020-08-23 DIAGNOSIS — B372 Candidiasis of skin and nail: Secondary | ICD-10-CM | POA: Diagnosis not present

## 2020-08-23 DIAGNOSIS — R69 Illness, unspecified: Secondary | ICD-10-CM | POA: Diagnosis not present

## 2020-08-26 DIAGNOSIS — M21371 Foot drop, right foot: Secondary | ICD-10-CM | POA: Diagnosis not present

## 2020-08-26 DIAGNOSIS — R5383 Other fatigue: Secondary | ICD-10-CM | POA: Diagnosis not present

## 2020-08-26 DIAGNOSIS — G5731 Lesion of lateral popliteal nerve, right lower limb: Secondary | ICD-10-CM | POA: Diagnosis not present

## 2020-08-26 DIAGNOSIS — G35 Multiple sclerosis: Secondary | ICD-10-CM | POA: Diagnosis not present

## 2020-08-27 ENCOUNTER — Encounter: Payer: Self-pay | Admitting: *Deleted

## 2020-08-27 NOTE — Chronic Care Management (AMB) (Signed)
Chronic Care Management   CCM RN Visit Note  08/10/2020 Name: Amber Williams MRN: [id number] DOB: June 26, 1975  Subjective: Amber Williams is a 45 y.o. year old female who is a primary care patient of [name], [name], MD. The care management team was consulted for assistance with disease management and care coordination needs.    Engaged with patient by telephone for follow up visit in response to provider referral for case management and/or care coordination services.   Consent to Services:  The patient was given information about Chronic Care Management services, agreed to services, and gave verbal consent prior to initiation of services.  Please see initial visit note for detailed documentation.   Patient agreed to services and verbal consent obtained.   Assessment: Review of patient past medical history, allergies, medications, health status, including review of consultants reports, laboratory and other test data, was performed as part of comprehensive evaluation and provision of chronic care management services.   SDOH (Social Determinants of Health) assessments and interventions performed:    CCM Care Plan  Allergies  Allergen Reactions  . Other Hives    Plastic Tape  . Tecfidera [Dimethyl Fumarate] Itching    Outpatient Encounter Medications as of 08/10/2020  Medication Sig Note  . ALLERGY RELIEF 180 MG tablet Take 180 mg by mouth daily as needed for allergies.   [name] ALTAVERA 0.15-30 MG-MCG tablet TAKE 1 TABLET BY MOUTH EVERY DAY (Patient taking differently: Take 1 tablet by mouth at bedtime.)   . amitriptyline (ELAVIL) 50 MG tablet Take 1 tablet (50 mg total) by mouth at bedtime.   [name] aspirin 81 MG EC tablet Take 81 mg by mouth at bedtime.   . [EXPIRED] cephALEXin (KEFLEX) 500 MG capsule Take 1 capsule (500 mg total) by mouth 2 (two) times daily for 6 days.   . Cholecalciferol (VITAMIN D3) 5000 units CAPS Take 5,000 Units by mouth every morning.   . Cyanocobalamin 5000 MCG  TBDP Take 5,000 mcg by mouth daily.   . [EXPIRED] doxycycline (VIBRA-TABS) 100 MG tablet Take 1 tablet (100 mg total) by mouth 2 (two) times daily for 6 days.   [name] escitalopram (LEXAPRO) 10 MG tablet Take 1 tablet (10 mg total) by mouth daily.   . feeding supplement (ENSURE ENLIVE / ENSURE PLUS) LIQD Take 237 mLs by mouth daily.   . [EXPIRED] fluconazole (DIFLUCAN) 100 MG tablet Take 1 tablet (100 mg total) by mouth daily for 9 days.   . flurazepam (DALMANE) 30 MG capsule Take 1 capsule (30 mg total) by mouth at bedtime.   [name] levothyroxine (SYNTHROID) 112 MCG tablet TAKE 1 TABLET DAILY BEFORE BREAKFAST (Patient taking differently: Take 125 mcg by mouth daily before breakfast.)   . Multiple Vitamins-Minerals (MULTIVITAMIN WITH MINERALS) tablet Take 1 tablet by mouth daily. One a day woman   . nutrition supplement, JUVEN, (JUVEN) PACK Take 1 packet by mouth 2 (two) times daily between meals.   . Nutritional Supplements (,FEEDING SUPPLEMENT, PROSOURCE PLUS) liquid Take 30 mLs by mouth daily.   [name] nystatin (MYCOSTATIN/NYSTOP) powder Apply 1 application topically in the morning and at bedtime. 08/03/2020: Pt last dose 08/01/20  . omeprazole (PRILOSEC) 20 MG capsule Take 1 capsule (20 mg total) by mouth daily.   [name] Phenyleph-Doxylamine-DM-APAP (NYQUIL SEVERE COLD/FLU) 5-6.25-10-325 MG/15ML LIQD Take 30 mLs by mouth at bedtime as needed (cold/cough).   . traMADol (ULTRAM) 50 MG tablet Take 1 tablet (50 mg total) by mouth every 12 (twelve) hours as needed for moderate pain  or severe pain.   . traZODone (DESYREL) 150 MG tablet USE FROM 1/3 TO 1 TABLET ASNEEDED FOR SLEEP (Patient taking differently: Take 150 mg by mouth at bedtime.)    No facility-administered encounter medications on file as of 08/10/2020.    Patient Active Problem List   Diagnosis Date Noted  . Candidal intertrigo 08/03/2020  . GERD (gastroesophageal reflux disease) 08/03/2020  . Cellulitis of abdominal wall 08/02/2020  . Dark red stool   .  Personal history of malignant neoplasm of rectum, rectosigmoid junction, and anus   . Genetic testing 08/11/2019  . Family history of breast cancer   . Port-A-Cath in place 01/23/2019  . Anal cancer ([place]) 01/10/2019  . Anal condyloma   . Hemorrhoids, external, with complication 29/92/4268  . Neurogenic bladder 08/03/2016  . Gait abnormality 08/03/2016  . Right foot drop 08/03/2016  . Hypothyroidism 01/31/2016  . Insomnia 01/31/2016  . Multiple sclerosis ([place]) 01/31/2016  . PTSD (post-traumatic stress disorder) 01/31/2016  . Depression 01/16/2011    Conditions to be addressed/monitored:Depression and multiple sclerosis, anal cancer, and cellulitis  Care Plan : RNCM: Cellulitis  Updates made by [name], RN since 08/27/2020 12:00 AM    Problem: Cellulitis of Abdominal Wall due to Open Wound from Fungal Infection   Priority: High    Goal: Wound and Cellulitis Improvement   Start Date: 08/10/2020  This Visit's Progress: On track  Priority: High  Note:   Current Barriers:  . Care Coordination needs related to cellulitis of abdominal wall in a patient with multiple sclerosis, MDD, anal cancer . Unable to independently drive  Nurse Case Manager Clinical Goal(s):  . patient will work with PCP to address needs related to medical management of cellulitis . patient will meet with RN Care Manager to address self-management of cellulitis . the patient will demonstrate ongoing self health care management ability as evidenced by proper wound care and dressing changes and by calling PCP with any new or worsening symptoms*  Interventions:  . 1:1 collaboration with [name], MD regarding development and update of comprehensive plan of care as evidenced by provider attestation and co-signature . Inter-disciplinary care team collaboration (see longitudinal plan of care) . Chart reviewed including relevant office notes, lab results, hosp discharge summary, and orders . Evaluation of  current treatment plan related to cellulitis and patient's adherence to plan as established by provider. . Discussed plans with patient for ongoing care management follow up and provided patient with direct contact information for care management team . Reviewed and discussed medications . Discussed current status of wound and wound care . Assessed wound care knowledge and provided basic verbal education on wound care . Confirmed that home health nursing services are active . Assessed for family/social support . Encouraged patient to reach out to PCP or seek appropriate medical attention for any new or worsening symptoms . Reviewed upcoming appointments . Encouraged patient to reach out to [facility] Manager as needed  [place]:  . Self administers medications as prescribed . Attends all scheduled provider appointments . Calls provider office for new concerns or questions . Wound care and dressing changes  Patient Goals Over the next 30 days, patient will: . Keep all medical appointments . Work with home health nursing for wound care . Call PCP or seek appropriate medical attention for any new or worsening symptoms . Eat nutritious meals with protein to promote wound healing and stay hydrated . Call RN Care Manager as needed [phone]  Follow Up Plan:  . Telephone follow up appointment with care management team member scheduled for: 09/07/20 with RNCM . The patient has been provided with contact information for the care management team and has been advised to call with any health related questions or concerns.  . Next PCP appointment scheduled for: 08/17/20 with Dr Livia Snellen  [name], BSN, RN-BC [place] [facility] [place] Management Direct Dial: [phone]

## 2020-08-27 NOTE — Patient Instructions (Signed)
Visit Information  PATIENT GOALS: Goals Addressed            This Visit's Progress   . Wound and Cellulitis Improvement       Timeframe:  Short-Term Goal Priority:  High Start Date:    08/10/20                         Expected End Date:     10/10/20                Follow-up: 09/07/20  . Keep all medical appointments . Work with home health nursing for wound care . Call PCP or seek appropriate medical attention for any new or worsening symptoms . Eat nutritious meals with protein to promote wound healing and stay hydrated . Call RN Care Manager as needed [phone]       Patient verbalizes understanding of instructions provided today and agrees to view in [place].   Follow Up Plan:  . Telephone follow up appointment with care management team member scheduled for: 09/07/20 with RNCM . The patient has been provided with contact information for the care management team and has been advised to call with any health related questions or concerns.  . Next PCP appointment scheduled for: 08/17/20 with Dr Livia Snellen  [name], BSN, RN-BC [place] [facility] [place] [facility] Direct Dial: [phone]

## 2020-08-30 ENCOUNTER — Other Ambulatory Visit: Payer: Self-pay

## 2020-08-30 ENCOUNTER — Ambulatory Visit (INDEPENDENT_AMBULATORY_CARE_PROVIDER_SITE_OTHER): Payer: Medicare HMO | Admitting: Family Medicine

## 2020-08-30 ENCOUNTER — Encounter: Payer: Self-pay | Admitting: Family Medicine

## 2020-08-30 VITALS — BP 86/63 | HR 77 | Temp 97.1°F | Ht 65.0 in

## 2020-08-30 DIAGNOSIS — G35 Multiple sclerosis: Secondary | ICD-10-CM

## 2020-08-30 DIAGNOSIS — R69 Illness, unspecified: Secondary | ICD-10-CM | POA: Diagnosis not present

## 2020-08-30 MED ORDER — BETAMETHASONE SOD PHOS & ACET 6 (3-3) MG/ML IJ SUSP
6.0000 mg | Freq: Once | INTRAMUSCULAR | Status: AC
  Administered 2020-08-30: 6 mg via INTRAMUSCULAR

## 2020-08-30 NOTE — Progress Notes (Signed)
Subjective:  Patient ID: Amber Williams, female    DOB: 1976/02/26  Age: 45 y.o. MRN: [id number]  CC: Medical Management of Chronic Issues   HPI Amber Williams presents for right leg weakness. Can barely walk even with walker as a result. Can slowly scoot her feet across a smooth floor. No relief with recently given medrol dosepak. Dr. Berdine Williams planning injections of Acthar. Awaiting insurance approval.   Amber Williams was hospitalized at [facility] last month for cellulitis. IT has esssentially cleared.   Depression screen [facility] 2/9 08/30/2020 08/17/2020 06/01/2020  Decreased Interest 0 0 0  Down, Depressed, Hopeless 0 0 0  PHQ - 2 Score 0 0 0  Altered sleeping - - -  Tired, decreased energy - - -  Change in appetite - - -  Feeling bad or failure about yourself  - - -  Trouble concentrating - - -  Moving slowly or fidgety/restless - - -  Suicidal thoughts - - -  PHQ-9 Score - - -  Difficult doing work/chores - - -  Some recent data might be hidden    History Amber Williams has a past medical history of Anal cancer ([place]) (01/10/2019), Arthritis, Depression, DVT (deep venous thrombosis) ([place]) (2013), Essential hypertension, Family history of breast cancer, Gait abnormality (08/03/2016), GERD (gastroesophageal reflux disease) (08/03/2020), History of blood transfusion (2013), Hyperlipidemia, Hypothyroidism, Hypothyroidism, Multiple sclerosis ([place]), Neurogenic bladder (08/03/2016), Port-A-Cath in place (01/23/2019), PTSD (post-traumatic stress disorder), PTSD (post-traumatic stress disorder), Right foot drop (08/03/2016), Seasonal allergies, and Ulcer.   Amber Williams has a past surgical history that includes Colon surgery; Gastric bypass (2010); Interstim Implant placement (2014); Portacath placement; Hemorrhoid surgery (N/A, 12/25/2018); Flexible sigmoidoscopy (N/A, 06/25/2019); Colonoscopy with propofol (N/A, 03/04/2020); and Colonoscopy (N/A, 07/06/2020).   Her family history includes Breast cancer (age of onset: 48) in her sister;  Breast cancer (age of onset: 68) in her mother; Cancer in her paternal grandmother; Diabetes in her father, maternal grandmother, and paternal grandmother; Heart disease in her maternal grandfather, maternal grandmother, and paternal grandfather; Hypertension in her father and sister; Miscarriages / Stillbirths in her sister; Stroke in her paternal grandfather.Amber Williams reports that Amber Williams quit smoking about 11 years ago. Her smoking use included cigarettes. Amber Williams started smoking about 29 years ago. Amber Williams has a 22.50 pack-year smoking history. Amber Williams has never used smokeless tobacco. Amber Williams reports previous alcohol use. Amber Williams reports that Amber Williams does not use drugs.    ROS Review of Systems  Constitutional: Negative.   HENT: Negative.   Eyes: Negative for visual disturbance.  Respiratory: Negative for shortness of breath.   Cardiovascular: Negative for chest pain.  Gastrointestinal: Negative for abdominal pain.  Musculoskeletal: Positive for arthralgias and myalgias.  Skin: Positive for wound (There is a sacral decubitus noted that Amber Williams is treating herself at home that Amber Williams wants checked.  Amber Williams is using dry dressings with ABD pads). Negative for rash.  Neurological: Positive for weakness.    Objective:  BP (!) 86/63   Pulse 77   Temp (!) 97.1 F (36.2 C)   Ht 5' 5" (1.651 m)   SpO2 97%   BMI 33.28 kg/m   BP Readings from Last 3 Encounters:  08/30/20 (!) 86/63  08/18/20 110/75  08/17/20 (!) 83/43    Wt Readings from Last 3 Encounters:  08/18/20 200 lb (90.7 kg)  08/17/20 203 lb 6.4 oz (92.3 kg)  08/02/20 200 lb (90.7 kg)     Physical Exam Constitutional:      General: Amber Williams is not in  acute distress.    Appearance: Amber Williams is well-developed.  Cardiovascular:     Rate and Rhythm: Normal rate and regular rhythm.  Pulmonary:     Breath sounds: Normal breath sounds.  Musculoskeletal:     Comments: Patient's legs are quite weak.  Amber Williams can stand alone briefly for the exam but cannot walk.  Amber Williams has to hold onto  the table to support herself.  Amber Williams is wheelchair-bound otherwise.  Skin:    General: Skin is warm and dry.     Findings: Lesion (A 6 mm x 1 cm sacral decubitus near the anus to the left of the midline.  No signs of infection including redness and swelling and drainage.) present.  Neurological:     Mental Status: Amber Williams is alert and oriented to person, place, and time.       Assessment & Plan:   Amber Williams was seen today for medical management of chronic issues.  Diagnoses and all orders for this visit:  Multiple sclerosis (HCC) -     betamethasone acetate-betamethasone sodium phosphate (CELESTONE) injection 6 mg -     TSH + free T4 -     CBC with Differential/Platelet -     CMP14+EGFR    Patient declined home health for the sacral decubitus.  Amber Williams wants to continue treating her self.  Wound care was reviewed.  Cleansing and dressing and signs of infection etc.   I have discontinued Amber Williams's terbinafine and econazole nitrate. [name] Vitamin D3, Cyanocobalamin, Allergy Relief, traZODone, aspirin, escitalopram, levothyroxine, Altavera, omeprazole, flurazepam, multivitamin with minerals, amitriptyline, nystatin, NyQuil Severe Cold/Flu, (feeding supplement) PROSource Plus, feeding supplement, nutrition supplement (JUVEN), and traMADol. We administered betamethasone acetate-betamethasone sodium phosphate.  Allergies as of 08/30/2020      Reactions   Other Hives   Plastic Tape   Tecfidera [dimethyl Fumarate] Itching      Medication List       Accurate as of Aug 30, 2020  5:52 PM. If you have any questions, ask your nurse or doctor.        STOP taking these medications   econazole nitrate 1 % cream Stopped by: Amber Fraise, MD   terbinafine 250 MG tablet Commonly known as: LamISIL Stopped by: Amber Fraise, MD     TAKE these medications   (feeding supplement) PROSource Plus liquid Take 30 mLs by mouth daily.   nutrition supplement (JUVEN)  Pack Take 1 packet by mouth 2 (two) times daily between meals.   feeding supplement Liqd Take 237 mLs by mouth daily.   Allergy Relief 180 MG tablet Generic drug: fexofenadine Take 180 mg by mouth daily as needed for allergies.   Altavera 0.15-30 MG-MCG tablet Generic drug: levonorgestrel-ethinyl estradiol TAKE 1 TABLET BY MOUTH EVERY DAY What changed: when to take this   amitriptyline 50 MG tablet Commonly known as: ELAVIL Take 1 tablet (50 mg total) by mouth at bedtime.   aspirin 81 MG EC tablet Take 81 mg by mouth at bedtime.   Cyanocobalamin 5000 MCG Tbdp Take 5,000 mcg by mouth daily.   escitalopram 10 MG tablet Commonly known as: LEXAPRO Take 1 tablet (10 mg total) by mouth daily.   flurazepam 30 MG capsule Commonly known as: DALMANE Take 1 capsule (30 mg total) by mouth at bedtime.   levothyroxine 112 MCG tablet Commonly known as: SYNTHROID TAKE 1 TABLET DAILY BEFORE BREAKFAST What changed:   how much to take  how to take this  when  to take this  additional instructions   multivitamin with minerals tablet Take 1 tablet by mouth daily. One a day woman   NyQuil Severe Cold/Flu 5-6.25-10-325 MG/15ML Liqd Generic drug: Phenyleph-Doxylamine-DM-APAP Take 30 mLs by mouth at bedtime as needed (cold/cough).   nystatin powder Commonly known as: MYCOSTATIN/NYSTOP Apply 1 application topically in the morning and at bedtime.   omeprazole 20 MG capsule Commonly known as: PRILOSEC Take 1 capsule (20 mg total) by mouth daily.   traMADol 50 MG tablet Commonly known as: ULTRAM Take 1 tablet (50 mg total) by mouth every 12 (twelve) hours as needed for moderate pain or severe pain.   traZODone 150 MG tablet Commonly known as: DESYREL USE FROM 1/3 TO 1 TABLET ASNEEDED FOR SLEEP What changed:   how much to take  how to take this  when to take this  additional instructions   Vitamin D3 125 MCG (5000 UT) Caps Take 5,000 Units by mouth every morning.         Follow-up: Return in about 2 weeks (around 09/13/2020).  Amber Williams, M.D.

## 2020-08-31 LAB — CMP14+EGFR
ALT: 36 IU/L — ABNORMAL HIGH (ref 0–32)
AST: 25 IU/L (ref 0–40)
Albumin/Globulin Ratio: 2.1 (ref 1.2–2.2)
Albumin: 3.9 g/dL (ref 3.8–4.8)
Alkaline Phosphatase: 96 IU/L (ref 44–121)
BUN/Creatinine Ratio: 10 (ref 9–23)
BUN: 9 mg/dL (ref 6–24)
Bilirubin Total: <0.2 mg/dL (ref 0.0–1.2)
CO2: 20 mmol/L (ref 20–29)
Calcium: 10.9 mg/dL — ABNORMAL HIGH (ref 8.7–10.2)
Chloride: 101 mmol/L (ref 96–106)
Creatinine, Ser: 0.89 mg/dL (ref 0.57–1.00)
Globulin, Total: 1.9 g/dL (ref 1.5–4.5)
Glucose: 82 mg/dL (ref 65–99)
Potassium: 4.3 mmol/L (ref 3.5–5.2)
Sodium: 140 mmol/L (ref 134–144)
Total Protein: 5.8 g/dL — ABNORMAL LOW (ref 6.0–8.5)
eGFR: 82 mL/min/{1.73_m2} (ref >59)

## 2020-08-31 LAB — CBC WITH DIFFERENTIAL/PLATELET
Basophils Absolute: 0 10*3/uL (ref 0.0–0.2)
Basos: 0 %
EOS (ABSOLUTE): 0 10*3/uL (ref 0.0–0.4)
Eos: 0 %
Hematocrit: 43.1 % (ref 34.0–46.6)
Hemoglobin: 14 g/dL (ref 11.1–15.9)
Immature Grans (Abs): 0.1 10*3/uL (ref 0.0–0.1)
Immature Granulocytes: 1 %
Lymphocytes Absolute: 0.6 10*3/uL — ABNORMAL LOW (ref 0.7–3.1)
Lymphs: 7 %
MCH: 31.3 pg (ref 26.6–33.0)
MCHC: 32.5 g/dL (ref 31.5–35.7)
MCV: 96 fL (ref 79–97)
Monocytes Absolute: 0.6 10*3/uL (ref 0.1–0.9)
Monocytes: 7 %
Neutrophils Absolute: 7.8 10*3/uL — ABNORMAL HIGH (ref 1.4–7.0)
Neutrophils: 85 %
Platelets: 366 10*3/uL (ref 150–450)
RBC: 4.47 x10E6/uL (ref 3.77–5.28)
RDW: 13.2 % (ref 11.7–15.4)
WBC: 9.2 10*3/uL (ref 3.4–10.8)

## 2020-08-31 LAB — TSH+FREE T4
Free T4: 1.43 ng/dL (ref 0.82–1.77)
TSH: 1.02 u[IU]/mL (ref 0.450–4.500)

## 2020-09-01 ENCOUNTER — Ambulatory Visit: Payer: Medicare HMO | Admitting: Licensed Clinical Social Worker

## 2020-09-01 DIAGNOSIS — C210 Malignant neoplasm of anus, unspecified: Secondary | ICD-10-CM

## 2020-09-01 DIAGNOSIS — F324 Major depressive disorder, single episode, in partial remission: Secondary | ICD-10-CM

## 2020-09-01 DIAGNOSIS — E039 Hypothyroidism, unspecified: Secondary | ICD-10-CM

## 2020-09-01 DIAGNOSIS — F4310 Post-traumatic stress disorder, unspecified: Secondary | ICD-10-CM

## 2020-09-01 DIAGNOSIS — G35 Multiple sclerosis: Secondary | ICD-10-CM

## 2020-09-01 NOTE — Patient Instructions (Signed)
Visit Information  PATIENT GOALS: Goals Addressed            This Visit's Progress   . Anxiety Symptoms Monitored and Managed        Timeframe: Short-Term   This Visit's Progress: On Track   Priority: Medium   Start Date  07/28/20  Expected End Date  10/28/20  Completed Date  Patient Self Care Activities:  . Takes medications as prescribed . Attends scheduled medical appointments  Patient Coping Strengths:  . Has support from her parents . Has transport help as needed . Takes time to rest and relax as needed  Patient Self Care Deficits:  [name] Mobility issues . Daily challenges with managing MS symptoms faced  Patient Goals:  - spend time or talk with others every day - practice relaxation or meditation daily - keep a calendar with appointment dates  Follow Up Plan: LCSW to call client on 10/11/20 to assess client needs       [name].Penne Rosenstock MSW, LCSW Licensed Clinical Social Worker [facility] Care Management [phone]

## 2020-09-01 NOTE — Chronic Care Management (AMB) (Signed)
Chronic Care Management    Clinical Social Work Note  09/01/2020 Name: Amber Williams MRN: [id number] DOB: Sep 04, 1975  Amber Williams is a 45 y.o. year old female who is a primary care patient of [name], [name], MD. The CCM team was consulted to assist the patient with chronic disease management and/or care coordination needs related to: [employer] .   LCSW was not able to speak with client or her mother via phone today for follow up visit in response to provider referral for social work chronic care management and care coordination services. LCSW did leave phone message for client and for her mother to please call LCSW at [phone]  Consent to Services:  The patient was given information about Chronic Care Management services, agreed to services, and gave verbal consent prior to initiation of services.  Please see initial visit note for detailed documentation.   Patient agreed to services and consent obtained.   Assessment: Review of patient past medical history, allergies, medications, and health status, including review of relevant consultants reports was performed today as part of a comprehensive evaluation and provision of chronic care management and care coordination services.     SDOH (Social Determinants of Health) assessments and interventions performed:  SDOH Interventions   Flowsheet Row Most Recent Value  SDOH Interventions   Depression Interventions/Treatment  Medication       Advanced Directives Status: See Vynca application for related entries.  CCM Care Plan  Allergies  Allergen Reactions  . Other Hives    Plastic Tape  . Tecfidera [Dimethyl Fumarate] Itching    Outpatient Encounter Medications as of 09/01/2020  Medication Sig Note  . ALLERGY RELIEF 180 MG tablet Take 180 mg by mouth daily as needed for allergies.   [name] ALTAVERA 0.15-30 MG-MCG tablet TAKE 1 TABLET BY MOUTH EVERY DAY (Patient taking differently: Take 1 tablet by mouth at bedtime.)    . amitriptyline (ELAVIL) 50 MG tablet Take 1 tablet (50 mg total) by mouth at bedtime.   [name] aspirin 81 MG EC tablet Take 81 mg by mouth at bedtime.   . Cholecalciferol (VITAMIN D3) 5000 units CAPS Take 5,000 Units by mouth every morning.   . Cyanocobalamin 5000 MCG TBDP Take 5,000 mcg by mouth daily.   [name] escitalopram (LEXAPRO) 10 MG tablet Take 1 tablet (10 mg total) by mouth daily.   . feeding supplement (ENSURE ENLIVE / ENSURE PLUS) LIQD Take 237 mLs by mouth daily.   . flurazepam (DALMANE) 30 MG capsule Take 1 capsule (30 mg total) by mouth at bedtime.   [name] levothyroxine (SYNTHROID) 112 MCG tablet TAKE 1 TABLET DAILY BEFORE BREAKFAST (Patient taking differently: Take 125 mcg by mouth daily before breakfast.)   . Multiple Vitamins-Minerals (MULTIVITAMIN WITH MINERALS) tablet Take 1 tablet by mouth daily. One a day woman   . nutrition supplement, JUVEN, (JUVEN) PACK Take 1 packet by mouth 2 (two) times daily between meals.   . Nutritional Supplements (,FEEDING SUPPLEMENT, PROSOURCE PLUS) liquid Take 30 mLs by mouth daily.   [name] nystatin (MYCOSTATIN/NYSTOP) powder Apply 1 application topically in the morning and at bedtime. 08/03/2020: Pt last dose 08/01/20  . omeprazole (PRILOSEC) 20 MG capsule Take 1 capsule (20 mg total) by mouth daily.   [name] Phenyleph-Doxylamine-DM-APAP (NYQUIL SEVERE COLD/FLU) 5-6.25-10-325 MG/15ML LIQD Take 30 mLs by mouth at bedtime as needed (cold/cough).   . traMADol (ULTRAM) 50 MG tablet Take 1 tablet (50 mg total) by mouth every 12 (twelve) hours as needed for moderate pain  or severe pain.   . traZODone (DESYREL) 150 MG tablet USE FROM 1/3 TO 1 TABLET ASNEEDED FOR SLEEP (Patient taking differently: Take 150 mg by mouth at bedtime.)    No facility-administered encounter medications on file as of 09/01/2020.    Patient Active Problem List   Diagnosis Date Noted  . Candidal intertrigo 08/03/2020  . GERD (gastroesophageal reflux disease) 08/03/2020  . Cellulitis of abdominal  wall 08/02/2020  . Dark red stool   . Personal history of malignant neoplasm of rectum, rectosigmoid junction, and anus   . Genetic testing 08/11/2019  . Family history of breast cancer   . Port-A-Cath in place 01/23/2019  . Anal cancer ([place]) 01/10/2019  . Anal condyloma   . Hemorrhoids, external, with complication 44/96/7591  . Neurogenic bladder 08/03/2016  . Gait abnormality 08/03/2016  . Right foot drop 08/03/2016  . Hypothyroidism 01/31/2016  . Insomnia 01/31/2016  . Multiple sclerosis ([place]) 01/31/2016  . PTSD (post-traumatic stress disorder) 01/31/2016  . Depression 01/16/2011    Conditions to be addressed/monitored: Monitor client management of depression issues faced   Care Plan : LCSW Care Plan  Updates made by Katha Cabal, LCSW since 09/01/2020 12:00 AM    Problem: Emotional Distress     Goal: Manage Anxiety and Stress Issues Faced   Start Date: 07/28/2020  Expected End Date: 10/28/2020  This Visit's Progress: On track  Recent Progress: On track  Priority: Medium  Note:   Current Barriers:  . Chronic Mental Health needs related to anxiety and stress management . Mobility issues . Suicidal Ideation/Homicidal Ideation: No  Clinical Social Work Goal(s):  . patient will work with SW monthly by telephone or in person to reduce or manage symptoms related to anxiety and stress issues faced . Patient will communicate with LCSW in next 30 days to discuss mobility issues of client  Interventions: . 1:1 collaboration with [name], MD regarding development and update of comprehensive plan of care as evidenced by provider attestation and co-signature . Chart reviewed to assess client needs . LCSW called phone number for Amber Williams, mother of client, today; LCSW was not able to speak via phone with Amber Williams today. LCSW did leave phone message for Amber Williams asking her to please call LCSW at [phone] . LCSW also called phone number for client today; LCSW was  not able to speak via phone with client today; however, LCSW did leave phone message for [facility] requesting that she please call LCSW at [phone]  Patient Self Care Activities:  . Takes medications as prescribed . Attends scheduled medical appointments  Patient Coping Strengths:  . Has support from her parents . Has transport help as needed . Takes time to rest and relax as needed  Patient Self Care Deficits:  [name] Mobility issues . Daily challenges with managing MS symptoms faced  Patient Goals:  - spend time or talk with others every day - practice relaxation or meditation daily - keep a calendar with appointment dates  Follow Up Plan: LCSW to call client on 10/11/20 to assess client needs     [name].Terren Haberle MSW, LCSW Licensed Clinical Social Worker [facility] Care Management [phone]

## 2020-09-05 NOTE — Progress Notes (Signed)
Hello [name],  Your lab result is normal and/or stable.Some minor variations that are not significant are commonly marked abnormal, but do not represent any medical problem for you.  Best regards, [name], M.D.

## 2020-09-06 ENCOUNTER — Other Ambulatory Visit: Payer: Self-pay | Admitting: Family Medicine

## 2020-09-07 ENCOUNTER — Telehealth: Payer: Medicare HMO

## 2020-09-14 ENCOUNTER — Other Ambulatory Visit: Payer: Self-pay

## 2020-09-14 ENCOUNTER — Ambulatory Visit (INDEPENDENT_AMBULATORY_CARE_PROVIDER_SITE_OTHER): Payer: Medicare HMO | Admitting: Family Medicine

## 2020-09-14 ENCOUNTER — Encounter: Payer: Self-pay | Admitting: Family Medicine

## 2020-09-14 VITALS — Temp 97.9°F

## 2020-09-14 DIAGNOSIS — R509 Fever, unspecified: Secondary | ICD-10-CM

## 2020-09-14 DIAGNOSIS — R8281 Pyuria: Secondary | ICD-10-CM

## 2020-09-14 DIAGNOSIS — Z95828 Presence of other vascular implants and grafts: Secondary | ICD-10-CM | POA: Diagnosis not present

## 2020-09-14 DIAGNOSIS — L89321 Pressure ulcer of left buttock, stage 1: Secondary | ICD-10-CM

## 2020-09-14 LAB — URINALYSIS
Bilirubin, UA: NEGATIVE
Glucose, UA: NEGATIVE
Nitrite, UA: POSITIVE — AB
Protein,UA: NEGATIVE
Specific Gravity, UA: 1.02 (ref 1.005–1.030)
Urobilinogen, Ur: 0.2 mg/dL (ref 0.2–1.0)
pH, UA: 5.5 (ref 5.0–7.5)

## 2020-09-14 MED ORDER — LEVOTHYROXINE SODIUM 125 MCG PO TABS: 125.0000 ug | ORAL_TABLET | Freq: Every day | ORAL | 1 refills | Status: DC

## 2020-09-14 MED ORDER — LEVONORGESTREL-ETHINYL ESTRAD 0.15-30 MG-MCG PO TABS: 1.0000 | ORAL_TABLET | Freq: Every day | ORAL | 11 refills | Status: DC

## 2020-09-14 MED ORDER — FLURAZEPAM HCL 30 MG PO CAPS: 30.0000 mg | ORAL_CAPSULE | Freq: Every day | ORAL | 2 refills | Status: DC

## 2020-09-14 MED ORDER — CIPROFLOXACIN HCL 500 MG PO TABS: 500.0000 mg | ORAL_TABLET | Freq: Two times a day (BID) | ORAL | 0 refills | Status: DC

## 2020-09-14 MED ORDER — DUODERM CGF DRESSING EX MISC: CUTANEOUS | 2 refills | Status: DC

## 2020-09-14 NOTE — Progress Notes (Signed)
Subjective:  Patient ID: Amber Williams, female    DOB: 31-Aug-1975  Age: 45 y.o. MRN: [id number]  CC: Follow-up   HPI Amber Williams presents for decubiti on buttocks. Sleeping in a recliner due to port in neck.  Since she is unable to freely move around during her sleep she has developed 3 decubiti on the buttocks.  These have become rather painful.  She has been dressing them at home with gauze and Neosporin.  She has limited mobility as a result of her multiple sclerosis as well.  She just finished a course of 500 mg daily of Solu-Medrol IV given through her port.  She says the left leg is still quite weak.  She is not walking well.  Additionally she reports that she has had multiple hot flashes and intermittent fevers as high as 107.5.  This occurred yesterday.  It broke with Tylenol.  She has had multiple episodes of cold chills alternating with the hot flashes.  She has not had a cough upper respiratory symptoms abdominal discomfort diarrhea or dysuria.  To explain the fever.  Depression screen [facility] 2/9 09/14/2020 09/14/2020 09/01/2020  Decreased Interest 1 0 0  Down, Depressed, Hopeless 0 0 0  PHQ - 2 Score 1 0 0  Altered sleeping 0 - 1  Tired, decreased energy 1 - 1  Change in appetite 0 - 0  Feeling bad or failure about yourself  0 - 0  Trouble concentrating 0 - 0  Moving slowly or fidgety/restless 0 - 1  Suicidal thoughts 1 - 0  PHQ-9 Score 3 - 3  Difficult doing work/chores Not difficult at all - Somewhat difficult  Some recent data might be hidden    History Amber Williams has a past medical history of Anal cancer ([place]) (01/10/2019), Arthritis, Depression, DVT (deep venous thrombosis) ([place]) (2013), Essential hypertension, Family history of breast cancer, Gait abnormality (08/03/2016), GERD (gastroesophageal reflux disease) (08/03/2020), History of blood transfusion (2013), Hyperlipidemia, Hypothyroidism, Hypothyroidism, Multiple sclerosis ([place]), Neurogenic bladder (08/03/2016), Port-A-Cath  in place (01/23/2019), PTSD (post-traumatic stress disorder), PTSD (post-traumatic stress disorder), Right foot drop (08/03/2016), Seasonal allergies, and Ulcer.   She has a past surgical history that includes Colon surgery; Gastric bypass (2010); Interstim Implant placement (2014); Portacath placement; Hemorrhoid surgery (N/A, 12/25/2018); Flexible sigmoidoscopy (N/A, 06/25/2019); Colonoscopy with propofol (N/A, 03/04/2020); and Colonoscopy (N/A, 07/06/2020).   Her family history includes Breast cancer (age of onset: 64) in her sister; Breast cancer (age of onset: 59) in her mother; Cancer in her paternal grandmother; Diabetes in her father, maternal grandmother, and paternal grandmother; Heart disease in her maternal grandfather, maternal grandmother, and paternal grandfather; Hypertension in her father and sister; Miscarriages / Stillbirths in her sister; Stroke in her paternal grandfather.She reports that she quit smoking about 11 years ago. Her smoking use included cigarettes. She started smoking about 29 years ago. She has a 22.50 pack-year smoking history. She has never used smokeless tobacco. She reports previous alcohol use. She reports that she does not use drugs.    ROS Review of Systems  Constitutional: Positive for fatigue and fever.  Respiratory: Negative for cough and shortness of breath.   Genitourinary: Negative for dysuria, frequency and urgency.    Objective:  Temp 97.9 F (36.6 C)   BP Readings from Last 3 Encounters:  08/30/20 (!) 86/63  08/18/20 110/75  08/17/20 (!) 83/43    Wt Readings from Last 3 Encounters:  08/18/20 200 lb (90.7 kg)  08/17/20 203 lb 6.4 oz (92.3  kg)  08/02/20 200 lb (90.7 kg)     Physical Exam Constitutional:      General: She is not in acute distress.    Appearance: She is well-developed.  Cardiovascular:     Rate and Rhythm: Normal rate and regular rhythm.  Pulmonary:     Breath sounds: Normal breath sounds.  Skin:    General: Skin is warm  and dry.     Findings: Lesion (There are 3 decubiti noted in the central buttocks region 2 to the left of center and 1 to the right.  They have a history of about 3 cm each and are grade 1 to perhaps a superficial grade 2.) present.  Neurological:     Mental Status: She is alert and oriented to person, place, and time.    Results for orders placed or performed in visit on 09/14/20  Urinalysis  Result Value Ref Range   Specific Gravity, UA 1.020 1.005 - 1.030   pH, UA 5.5 5.0 - 7.5   Color, UA Yellow Yellow   Appearance Ur Cloudy (A) Clear   Leukocytes,UA 3+ (A) Negative   Protein,UA Negative Negative/Trace   Glucose, UA Negative Negative   Ketones, UA Trace (A) Negative   RBC, UA Trace (A) Negative   Bilirubin, UA Negative Negative   Urobilinogen, Ur 0.2 0.2 - 1.0 mg/dL   Nitrite, UA Positive (A) Negative     Assessment & Plan:   Amber Williams was seen today for follow-up.  Diagnoses and all orders for this visit:  Fever 106 degrees F or over -     DG Chest 2 View; Future -     CBC with Differential/Platelet -     Urinalysis -     Urine Culture  Port-A-Cath in place -     DG Chest 2 View; Future -     CBC with Differential/Platelet -     Urinalysis  Pyuria  Pressure injury of left buttock, stage 1  Other orders -     flurazepam (DALMANE) 30 MG capsule; Take 1 capsule (30 mg total) by mouth at bedtime. -     levonorgestrel-ethinyl estradiol (ALTAVERA) 0.15-30 MG-MCG tablet; Take 1 tablet by mouth daily. -     levothyroxine (SYNTHROID) 125 MCG tablet; Take 1 tablet (125 mcg total) by mouth daily before breakfast. -     Control Gel Formula Dressing (DUODERM CGF DRESSING) MISC; Apply every 3 1/2 days. -     ciprofloxacin (CIPRO) 500 MG tablet; Take 1 tablet (500 mg total) by mouth 2 (two) times daily.       I have changed Amber Williams's Altavera to levonorgestrel-ethinyl estradiol. I have also changed her levothyroxine. I am also having her start on DuoDERM CGF  Dressing and ciprofloxacin. Additionally, I am having her maintain her Vitamin D3, Cyanocobalamin, Allergy Relief, traZODone, aspirin, escitalopram, omeprazole, multivitamin with minerals, amitriptyline, nystatin, NyQuil Severe Cold/Flu, (feeding supplement) PROSource Plus, feeding supplement, nutrition supplement (JUVEN), traMADol, and flurazepam.  Allergies as of 09/14/2020      Reactions   Other Hives   Plastic Tape   Tecfidera [dimethyl Fumarate] Itching      Medication List       Accurate as of Sep 14, 2020  9:35 PM. If you have any questions, ask your nurse or doctor.        (feeding supplement) PROSource Plus liquid Take 30 mLs by mouth daily.   nutrition supplement (JUVEN) Pack Take 1 packet by mouth 2 (two) times daily  between meals.   feeding supplement Liqd Take 237 mLs by mouth daily.   Allergy Relief 180 MG tablet Generic drug: fexofenadine Take 180 mg by mouth daily as needed for allergies.   amitriptyline 50 MG tablet Commonly known as: ELAVIL Take 1 tablet (50 mg total) by mouth at bedtime.   aspirin 81 MG EC tablet Take 81 mg by mouth at bedtime.   ciprofloxacin 500 MG tablet Commonly known as: Cipro Take 1 tablet (500 mg total) by mouth 2 (two) times daily. Started by: [name], MD   Cyanocobalamin 5000 MCG Tbdp Take 5,000 mcg by mouth daily.   DuoDERM CGF Dressing Misc Apply every 3 1/2 days. Started by: [name], MD   escitalopram 10 MG tablet Commonly known as: LEXAPRO Take 1 tablet (10 mg total) by mouth daily.   flurazepam 30 MG capsule Commonly known as: DALMANE Take 1 capsule (30 mg total) by mouth at bedtime.   levonorgestrel-ethinyl estradiol 0.15-30 MG-MCG tablet Commonly known as: Altavera Take 1 tablet by mouth daily. What changed: when to take this   levothyroxine 125 MCG tablet Commonly known as: SYNTHROID Take 1 tablet (125 mcg total) by mouth daily before breakfast. What changed:   medication strength  how  much to take  how to take this  when to take this  additional instructions Changed by: [name], MD   multivitamin with minerals tablet Take 1 tablet by mouth daily. One a day woman   NyQuil Severe Cold/Flu 5-6.25-10-325 MG/15ML Liqd Generic drug: Phenyleph-Doxylamine-DM-APAP Take 30 mLs by mouth at bedtime as needed (cold/cough).   nystatin powder Commonly known as: MYCOSTATIN/NYSTOP Apply 1 application topically in the morning and at bedtime.   omeprazole 20 MG capsule Commonly known as: PRILOSEC Take 1 capsule (20 mg total) by mouth daily.   traMADol 50 MG tablet Commonly known as: ULTRAM Take 1 tablet (50 mg total) by mouth every 12 (twelve) hours as needed for moderate pain or severe pain.   traZODone 150 MG tablet Commonly known as: DESYREL USE FROM 1/3 TO 1 TABLET ASNEEDED FOR SLEEP What changed:   how much to take  how to take this  when to take this  additional instructions   Vitamin D3 125 MCG (5000 UT) Caps Take 5,000 Units by mouth every morning.        Follow-up: Return in about 1 week (around 09/21/2020), or if symptoms worsen or fail to improve.  [name], M.D.

## 2020-09-15 ENCOUNTER — Ambulatory Visit (INDEPENDENT_AMBULATORY_CARE_PROVIDER_SITE_OTHER): Payer: Medicare HMO

## 2020-09-15 ENCOUNTER — Other Ambulatory Visit (INDEPENDENT_AMBULATORY_CARE_PROVIDER_SITE_OTHER): Payer: Medicare HMO

## 2020-09-15 DIAGNOSIS — L03311 Cellulitis of abdominal wall: Secondary | ICD-10-CM

## 2020-09-15 DIAGNOSIS — R21 Rash and other nonspecific skin eruption: Secondary | ICD-10-CM | POA: Diagnosis not present

## 2020-09-15 DIAGNOSIS — I10 Essential (primary) hypertension: Secondary | ICD-10-CM | POA: Diagnosis not present

## 2020-09-15 DIAGNOSIS — N319 Neuromuscular dysfunction of bladder, unspecified: Secondary | ICD-10-CM | POA: Diagnosis not present

## 2020-09-15 DIAGNOSIS — F32A Depression, unspecified: Secondary | ICD-10-CM

## 2020-09-15 DIAGNOSIS — Z85048 Personal history of other malignant neoplasm of rectum, rectosigmoid junction, and anus: Secondary | ICD-10-CM

## 2020-09-15 DIAGNOSIS — R509 Fever, unspecified: Secondary | ICD-10-CM

## 2020-09-15 DIAGNOSIS — G35 Multiple sclerosis: Secondary | ICD-10-CM | POA: Diagnosis not present

## 2020-09-15 DIAGNOSIS — B372 Candidiasis of skin and nail: Secondary | ICD-10-CM | POA: Diagnosis not present

## 2020-09-15 DIAGNOSIS — J449 Chronic obstructive pulmonary disease, unspecified: Secondary | ICD-10-CM | POA: Diagnosis not present

## 2020-09-15 DIAGNOSIS — E785 Hyperlipidemia, unspecified: Secondary | ICD-10-CM

## 2020-09-15 DIAGNOSIS — E669 Obesity, unspecified: Secondary | ICD-10-CM

## 2020-09-15 DIAGNOSIS — K219 Gastro-esophageal reflux disease without esophagitis: Secondary | ICD-10-CM | POA: Diagnosis not present

## 2020-09-15 DIAGNOSIS — Z95828 Presence of other vascular implants and grafts: Secondary | ICD-10-CM | POA: Diagnosis not present

## 2020-09-15 DIAGNOSIS — Z6833 Body mass index (BMI) 33.0-33.9, adult: Secondary | ICD-10-CM

## 2020-09-15 DIAGNOSIS — Z86718 Personal history of other venous thrombosis and embolism: Secondary | ICD-10-CM

## 2020-09-15 DIAGNOSIS — M21371 Foot drop, right foot: Secondary | ICD-10-CM

## 2020-09-15 DIAGNOSIS — F4310 Post-traumatic stress disorder, unspecified: Secondary | ICD-10-CM

## 2020-09-15 DIAGNOSIS — Z9181 History of falling: Secondary | ICD-10-CM

## 2020-09-15 DIAGNOSIS — R0600 Dyspnea, unspecified: Secondary | ICD-10-CM | POA: Diagnosis not present

## 2020-09-15 DIAGNOSIS — R69 Illness, unspecified: Secondary | ICD-10-CM | POA: Diagnosis not present

## 2020-09-15 LAB — CBC WITH DIFFERENTIAL/PLATELET
Basophils Absolute: 0 10*3/uL (ref 0.0–0.2)
Basos: 0 %
EOS (ABSOLUTE): 0 10*3/uL (ref 0.0–0.4)
Eos: 0 %
Hematocrit: 41 % (ref 34.0–46.6)
Hemoglobin: 12.8 g/dL (ref 11.1–15.9)
Immature Grans (Abs): 0.2 10*3/uL — ABNORMAL HIGH (ref 0.0–0.1)
Immature Granulocytes: 1 %
Lymphocytes Absolute: 0.2 10*3/uL — ABNORMAL LOW (ref 0.7–3.1)
Lymphs: 1 %
MCH: 30 pg (ref 26.6–33.0)
MCHC: 31.2 g/dL — ABNORMAL LOW (ref 31.5–35.7)
MCV: 96 fL (ref 79–97)
Monocytes Absolute: 0.2 10*3/uL (ref 0.1–0.9)
Monocytes: 1 %
Neutrophils Absolute: 21.8 10*3/uL — ABNORMAL HIGH (ref 1.4–7.0)
Neutrophils: 97 %
Platelets: 399 10*3/uL (ref 150–450)
RBC: 4.27 x10E6/uL (ref 3.77–5.28)
RDW: 13 % (ref 11.7–15.4)
WBC: 22.4 10*3/uL (ref 3.4–10.8)

## 2020-09-16 ENCOUNTER — Other Ambulatory Visit: Payer: Self-pay | Admitting: Family Medicine

## 2020-09-16 DIAGNOSIS — Z20822 Contact with and (suspected) exposure to covid-19: Secondary | ICD-10-CM | POA: Diagnosis not present

## 2020-09-16 DIAGNOSIS — N3000 Acute cystitis without hematuria: Secondary | ICD-10-CM | POA: Diagnosis not present

## 2020-09-16 DIAGNOSIS — L89152 Pressure ulcer of sacral region, stage 2: Secondary | ICD-10-CM | POA: Insufficient documentation

## 2020-09-16 DIAGNOSIS — E039 Hypothyroidism, unspecified: Secondary | ICD-10-CM | POA: Diagnosis not present

## 2020-09-16 DIAGNOSIS — D72829 Elevated white blood cell count, unspecified: Secondary | ICD-10-CM | POA: Diagnosis not present

## 2020-09-16 DIAGNOSIS — G35 Multiple sclerosis: Secondary | ICD-10-CM | POA: Diagnosis not present

## 2020-09-16 DIAGNOSIS — E785 Hyperlipidemia, unspecified: Secondary | ICD-10-CM | POA: Diagnosis not present

## 2020-09-16 DIAGNOSIS — N3090 Cystitis, unspecified without hematuria: Secondary | ICD-10-CM | POA: Diagnosis not present

## 2020-09-16 DIAGNOSIS — N319 Neuromuscular dysfunction of bladder, unspecified: Secondary | ICD-10-CM | POA: Diagnosis not present

## 2020-09-16 DIAGNOSIS — I491 Atrial premature depolarization: Secondary | ICD-10-CM | POA: Diagnosis not present

## 2020-09-16 DIAGNOSIS — Z79890 Hormone replacement therapy (postmenopausal): Secondary | ICD-10-CM | POA: Diagnosis not present

## 2020-09-16 DIAGNOSIS — Z792 Long term (current) use of antibiotics: Secondary | ICD-10-CM | POA: Diagnosis not present

## 2020-09-16 DIAGNOSIS — Z7982 Long term (current) use of aspirin: Secondary | ICD-10-CM | POA: Diagnosis not present

## 2020-09-16 DIAGNOSIS — L89159 Pressure ulcer of sacral region, unspecified stage: Secondary | ICD-10-CM | POA: Diagnosis not present

## 2020-09-16 DIAGNOSIS — R509 Fever, unspecified: Secondary | ICD-10-CM | POA: Diagnosis not present

## 2020-09-16 DIAGNOSIS — Z85048 Personal history of other malignant neoplasm of rectum, rectosigmoid junction, and anus: Secondary | ICD-10-CM | POA: Diagnosis not present

## 2020-09-17 DIAGNOSIS — L89152 Pressure ulcer of sacral region, stage 2: Secondary | ICD-10-CM | POA: Diagnosis not present

## 2020-09-17 DIAGNOSIS — G35 Multiple sclerosis: Secondary | ICD-10-CM | POA: Diagnosis not present

## 2020-09-17 DIAGNOSIS — D72829 Elevated white blood cell count, unspecified: Secondary | ICD-10-CM | POA: Diagnosis not present

## 2020-09-17 DIAGNOSIS — E039 Hypothyroidism, unspecified: Secondary | ICD-10-CM | POA: Diagnosis not present

## 2020-09-17 DIAGNOSIS — N3000 Acute cystitis without hematuria: Secondary | ICD-10-CM | POA: Diagnosis not present

## 2020-09-17 DIAGNOSIS — N319 Neuromuscular dysfunction of bladder, unspecified: Secondary | ICD-10-CM | POA: Diagnosis not present

## 2020-09-17 DIAGNOSIS — Z85048 Personal history of other malignant neoplasm of rectum, rectosigmoid junction, and anus: Secondary | ICD-10-CM | POA: Diagnosis not present

## 2020-09-18 DIAGNOSIS — D72829 Elevated white blood cell count, unspecified: Secondary | ICD-10-CM | POA: Diagnosis not present

## 2020-09-19 NOTE — Progress Notes (Signed)
Your chest x-ray looked normal. Thanks, WS.

## 2020-09-20 ENCOUNTER — Telehealth: Payer: Self-pay

## 2020-09-20 NOTE — Telephone Encounter (Addendum)
Transition Care Management Follow-up Telephone Call  Date of discharge and from where: [facility] 09/18/20  Diagnosis: pressure ulcers (also has shingles on face)  How have you been since you were released from the hospital? stable  Any questions or concerns? No  Items Reviewed:  Did the pt receive and understand the discharge instructions provided? Yes   Medications obtained and verified? Yes   Other? No   Any new allergies since your discharge? No   Dietary orders reviewed? Yes  Do you have support at home? Yes   Home Care and Equipment/Supplies: Were home health services ordered? yes If so, what is the name of the agency? [facility]  Has the agency set up a time to come to the patient's home? yes Were any new equipment or medical supplies ordered?  No What is the name of the medical supply agency? N/A Were you able to get the supplies/equipment? not applicable Do you have any questions related to the use of the equipment or supplies? No  Functional Questionnaire: (I = Independent and D = Dependent) ADLs: I - with assistance  Bathing/Dressing- I with assistance  Meal Prep- I  Eating- I  Maintaining continence- I  Transferring/Ambulation- I   Managing Meds- I  Follow up appointments reviewed:   PCP Hospital f/u appt confirmed? Yes  Scheduled to see [name] on 5/24 @ 2/55.  [facility] f/u appt confirmed? No   Are transportation arrangements needed? No   If their condition worsens, is the pt aware to call PCP or go to the Emergency Dept.? Yes  Was the patient provided with contact information for the PCP's office or ED? Yes  Was to pt encouraged to call back with questions or concerns? Yes

## 2020-09-21 ENCOUNTER — Other Ambulatory Visit: Payer: Self-pay

## 2020-09-21 ENCOUNTER — Encounter: Payer: Self-pay | Admitting: Family Medicine

## 2020-09-21 ENCOUNTER — Ambulatory Visit (INDEPENDENT_AMBULATORY_CARE_PROVIDER_SITE_OTHER): Payer: Medicare HMO | Admitting: Family Medicine

## 2020-09-21 VITALS — BP 99/68 | HR 112 | Temp 97.2°F | Ht 65.0 in | Wt 202.6 lb

## 2020-09-21 DIAGNOSIS — N319 Neuromuscular dysfunction of bladder, unspecified: Secondary | ICD-10-CM | POA: Diagnosis not present

## 2020-09-21 DIAGNOSIS — O2302 Infections of kidney in pregnancy, second trimester: Secondary | ICD-10-CM | POA: Diagnosis not present

## 2020-09-21 DIAGNOSIS — G35 Multiple sclerosis: Secondary | ICD-10-CM

## 2020-09-21 DIAGNOSIS — Z95828 Presence of other vascular implants and grafts: Secondary | ICD-10-CM | POA: Diagnosis not present

## 2020-09-21 DIAGNOSIS — N10 Acute tubulo-interstitial nephritis: Secondary | ICD-10-CM | POA: Diagnosis not present

## 2020-09-21 DIAGNOSIS — C210 Malignant neoplasm of anus, unspecified: Secondary | ICD-10-CM | POA: Diagnosis not present

## 2020-09-21 DIAGNOSIS — B029 Zoster without complications: Secondary | ICD-10-CM

## 2020-09-21 LAB — URINALYSIS
Bilirubin, UA: NEGATIVE
Glucose, UA: NEGATIVE
Ketones, UA: NEGATIVE
Leukocytes,UA: NEGATIVE
Nitrite, UA: NEGATIVE
Protein,UA: NEGATIVE
RBC, UA: NEGATIVE
Specific Gravity, UA: 1.015 (ref 1.005–1.030)
Urobilinogen, Ur: 0.2 mg/dL (ref 0.2–1.0)
pH, UA: 7 (ref 5.0–7.5)

## 2020-09-21 NOTE — Progress Notes (Signed)
Subjective:  Patient ID: Amber Williams, female    DOB: 1975/10/15  Age: 45 y.o. MRN: [id number]  CC: Transitions Of Care   HPI Amber Williams presents for post hospital visit for Leukocytosis, decubitus ulcer and shingles of the right face. She was given IV antibiotics. WBC had been 22.4K on 5/17. Was down to 6.9 by discharge on 5/21. She was told she had a kidney infection.Had been seen here the day bewfore with high fever. WBC came back high so she was sent to the E.D.While in the hospital she developed shingles. She was discharged on oral valtrex and nitrofurantoin. She has the cipro from last week's visit still. She only took it for a day prior to hospitalization. Currently she iss till weak, but having no dysuria. The wound on her buttock is unchanged. The porta cath is still in place.It has been used for anal cancer chemotherapy.   Depression screen [facility] 2/9 09/21/2020 09/14/2020 09/14/2020  Decreased Interest 0 1 0  Down, Depressed, Hopeless 0 0 0  PHQ - 2 Score 0 1 0  Altered sleeping 0 0 -  Tired, decreased energy 1 1 -  Change in appetite 0 0 -  Feeling bad or failure about yourself  0 0 -  Trouble concentrating 0 0 -  Moving slowly or fidgety/restless 0 0 -  Suicidal thoughts 0 1 -  PHQ-9 Score 1 3 -  Difficult doing work/chores Not difficult at all Not difficult at all -  Some recent data might be hidden    History Amber Williams has a past medical history of Anal cancer ([place]) (01/10/2019), Arthritis, Depression, DVT (deep venous thrombosis) ([place]) (2013), Essential hypertension, Family history of breast cancer, Gait abnormality (08/03/2016), GERD (gastroesophageal reflux disease) (08/03/2020), History of blood transfusion (2013), Hyperlipidemia, Hypothyroidism, Hypothyroidism, Multiple sclerosis ([place]), Neurogenic bladder (08/03/2016), Port-A-Cath in place (01/23/2019), PTSD (post-traumatic stress disorder), PTSD (post-traumatic stress disorder), Right foot drop (08/03/2016), Seasonal  allergies, and Ulcer.   She has a past surgical history that includes Colon surgery; Gastric bypass (2010); Interstim Implant placement (2014); Portacath placement; Hemorrhoid surgery (N/A, 12/25/2018); Flexible sigmoidoscopy (N/A, 06/25/2019); Colonoscopy with propofol (N/A, 03/04/2020); and Colonoscopy (N/A, 07/06/2020).   Her family history includes Breast cancer (age of onset: 71) in her sister; Breast cancer (age of onset: 25) in her mother; Cancer in her paternal grandmother; Diabetes in her father, maternal grandmother, and paternal grandmother; Heart disease in her maternal grandfather, maternal grandmother, and paternal grandfather; Hypertension in her father and sister; Miscarriages / Stillbirths in her sister; Stroke in her paternal grandfather.She reports that she quit smoking about 11 years ago. Her smoking use included cigarettes. She started smoking about 29 years ago. She has a 22.50 pack-year smoking history. She has never used smokeless tobacco. She reports previous alcohol use. She reports that she does not use drugs.    ROS Review of Systems  Constitutional: Positive for fatigue.  HENT: Negative.   Eyes: Negative for visual disturbance.  Respiratory: Negative for shortness of breath.   Cardiovascular: Negative for chest pain.  Gastrointestinal: Negative for abdominal pain.  Musculoskeletal: Positive for myalgias.  Skin: Positive for wound (grade 1-2 decubitus on left buttocks. ).  Neurological: Positive for weakness (nonfocal).    Objective:  BP 99/68   Pulse (!) 112   Temp (!) 97.2 F (36.2 C)   Ht _0  (1.651 m)   Wt 202 lb 9.6 oz (91.9 kg)   SpO2 99%   BMI 33.71 kg/m   BP  Readings from Last 3 Encounters:  09/21/20 99/68  08/30/20 (!) 86/63  08/18/20 110/75    Wt Readings from Last 3 Encounters:  09/21/20 202 lb 9.6 oz (91.9 kg)  08/18/20 200 lb (90.7 kg)  08/17/20 203 lb 6.4 oz (92.3 kg)     Physical Exam Vitals reviewed.  Constitutional:       General: She is not in acute distress.    Appearance: She is well-developed.  Cardiovascular:     Rate and Rhythm: Normal rate and regular rhythm.  Pulmonary:     Breath sounds: Normal breath sounds.  Skin:    General: Skin is warm and dry.     Findings: Lesion (left upper buttocks has 1-2 grade decubitus 2X 5 cm. ) and rash (papular erythema at the corner of the nose on the right side) present.  Neurological:     Mental Status: She is alert and oriented to person, place, and time.       Assessment & Plan:   Amber Williams was seen today for transitions of care.  Diagnoses and all orders for this visit:  Pyelonephritis complicating pregnancy in second trimester -     CBC with Differential/Platelet -     BMP8+EGFR -     Urinalysis -     Urine Culture  Port-A-Cath in place -     CBC with Differential/Platelet -     BMP8+EGFR -     Urinalysis -     Urine Culture  Neurogenic bladder -     CBC with Differential/Platelet -     BMP8+EGFR -     Urinalysis -     Urine Culture -     Ambulatory referral to Urology  Herpes zoster without complication  Multiple sclerosis ([place])  Anal cancer ([place])       I am having Amber Williams maintain her Vitamin D3, Cyanocobalamin, Allergy Relief, traZODone, aspirin, omeprazole, multivitamin with minerals, amitriptyline, nystatin, NyQuil Severe Cold/Flu, (feeding supplement) PROSource Plus, feeding supplement, nutrition supplement (JUVEN), traMADol, flurazepam, levonorgestrel-ethinyl estradiol, levothyroxine, DuoDERM CGF Dressing, ciprofloxacin, escitalopram, ascorbic acid, Vita C/Bioflavonoids/Rose Hips, furosemide, nitrofurantoin (macrocrystal-monohydrate), and valACYclovir.  Allergies as of 09/21/2020      Reactions   Other Hives   Plastic Tape   Tecfidera [dimethyl Fumarate] Itching      Medication List       Accurate as of Sep 21, 2020  3:41 PM. If you have any questions, ask your nurse or doctor.        (feeding supplement)  PROSource Plus liquid Take 30 mLs by mouth daily.   nutrition supplement (JUVEN) Pack Take 1 packet by mouth 2 (two) times daily between meals.   feeding supplement Liqd Take 237 mLs by mouth daily.   Allergy Relief 180 MG tablet Generic drug: fexofenadine Take 180 mg by mouth daily as needed for allergies.   amitriptyline 50 MG tablet Commonly known as: ELAVIL Take 1 tablet (50 mg total) by mouth at bedtime.   ascorbic acid 1000 MG tablet Commonly known as: VITAMIN C Take 1 tablet by mouth daily.   aspirin 81 MG EC tablet Take 81 mg by mouth at bedtime.   ciprofloxacin 500 MG tablet Commonly known as: Cipro Take 1 tablet (500 mg total) by mouth 2 (two) times daily.   Cyanocobalamin 5000 MCG Tbdp Take 5,000 mcg by mouth daily.   DuoDERM CGF Dressing Misc Apply every 3 1/2 days.   escitalopram 10 MG tablet Commonly known as: LEXAPRO TAKE 1 TABLET DAILY  flurazepam 30 MG capsule Commonly known as: DALMANE Take 1 capsule (30 mg total) by mouth at bedtime.   furosemide 20 MG tablet Commonly known as: LASIX Take by mouth.   levonorgestrel-ethinyl estradiol 0.15-30 MG-MCG tablet Commonly known as: Altavera Take 1 tablet by mouth daily.   levothyroxine 125 MCG tablet Commonly known as: SYNTHROID Take 1 tablet (125 mcg total) by mouth daily before breakfast.   multivitamin with minerals tablet Take 1 tablet by mouth daily. One a day woman   nitrofurantoin (macrocrystal-monohydrate) 100 MG capsule Commonly known as: MACROBID Take by mouth.   NyQuil Severe Cold/Flu 5-6.25-10-325 MG/15ML Liqd Generic drug: Phenyleph-Doxylamine-DM-APAP Take 30 mLs by mouth at bedtime as needed (cold/cough).   nystatin powder Commonly known as: MYCOSTATIN/NYSTOP Apply 1 application topically in the morning and at bedtime.   omeprazole 20 MG capsule Commonly known as: PRILOSEC Take 1 capsule (20 mg total) by mouth daily.   traMADol 50 MG tablet Commonly known as:  ULTRAM Take 1 tablet (50 mg total) by mouth every 12 (twelve) hours as needed for moderate pain or severe pain.   traZODone 150 MG tablet Commonly known as: DESYREL USE FROM 1/3 TO 1 TABLET ASNEEDED FOR SLEEP What changed:   how much to take  how to take this  when to take this  additional instructions   valACYclovir 1000 MG tablet Commonly known as: VALTREX Take by mouth.   Vita C/Bioflavonoids/Rose Hips 1000-30-18 MG Tabs Take 1 tablet by mouth daily.   Vitamin D3 125 MCG (5000 UT) Caps Take 5,000 Units by mouth every morning.      Finish cipro and valtrex  Follow-up: Return in about 2 weeks (around 10/05/2020), or if symptoms worsen or fail to improve.  Amber Williams, M.D.

## 2020-09-22 ENCOUNTER — Encounter (HOSPITAL_COMMUNITY): Payer: Self-pay | Admitting: Hematology

## 2020-09-22 DIAGNOSIS — Z9181 History of falling: Secondary | ICD-10-CM | POA: Diagnosis not present

## 2020-09-22 DIAGNOSIS — Z87891 Personal history of nicotine dependence: Secondary | ICD-10-CM | POA: Diagnosis not present

## 2020-09-22 DIAGNOSIS — I10 Essential (primary) hypertension: Secondary | ICD-10-CM | POA: Diagnosis not present

## 2020-09-22 DIAGNOSIS — L89152 Pressure ulcer of sacral region, stage 2: Secondary | ICD-10-CM | POA: Diagnosis not present

## 2020-09-22 DIAGNOSIS — G35 Multiple sclerosis: Secondary | ICD-10-CM | POA: Diagnosis not present

## 2020-09-22 DIAGNOSIS — N39498 Other specified urinary incontinence: Secondary | ICD-10-CM | POA: Diagnosis not present

## 2020-09-22 DIAGNOSIS — N319 Neuromuscular dysfunction of bladder, unspecified: Secondary | ICD-10-CM | POA: Diagnosis not present

## 2020-09-22 LAB — URINE CULTURE

## 2020-09-22 LAB — CBC WITH DIFFERENTIAL/PLATELET
Basophils Absolute: 0 10*3/uL (ref 0.0–0.2)
Basos: 0 %
EOS (ABSOLUTE): 0.2 10*3/uL (ref 0.0–0.4)
Eos: 3 %
Hematocrit: 38.3 % (ref 34.0–46.6)
Hemoglobin: 12.6 g/dL (ref 11.1–15.9)
Immature Grans (Abs): 0.1 10*3/uL (ref 0.0–0.1)
Immature Granulocytes: 2 %
Lymphocytes Absolute: 1 10*3/uL (ref 0.7–3.1)
Lymphs: 11 %
MCH: 31.1 pg (ref 26.6–33.0)
MCHC: 32.9 g/dL (ref 31.5–35.7)
MCV: 95 fL (ref 79–97)
Monocytes Absolute: 0.8 10*3/uL (ref 0.1–0.9)
Monocytes: 9 %
Neutrophils Absolute: 6.4 10*3/uL (ref 1.4–7.0)
Neutrophils: 75 %
Platelets: 393 10*3/uL (ref 150–450)
RBC: 4.05 x10E6/uL (ref 3.77–5.28)
RDW: 13.5 % (ref 11.7–15.4)
WBC: 8.5 10*3/uL (ref 3.4–10.8)

## 2020-09-22 LAB — BMP8+EGFR
BUN/Creatinine Ratio: 17 (ref 9–23)
BUN: 15 mg/dL (ref 6–24)
CO2: 21 mmol/L (ref 20–29)
Calcium: 10.4 mg/dL — ABNORMAL HIGH (ref 8.7–10.2)
Chloride: 105 mmol/L (ref 96–106)
Creatinine, Ser: 0.87 mg/dL (ref 0.57–1.00)
Glucose: 49 mg/dL — ABNORMAL LOW (ref 65–99)
Potassium: 4.4 mmol/L (ref 3.5–5.2)
Sodium: 145 mmol/L — ABNORMAL HIGH (ref 134–144)
eGFR: 84 mL/min/{1.73_m2} (ref >59)

## 2020-09-23 DIAGNOSIS — G5731 Lesion of lateral popliteal nerve, right lower limb: Secondary | ICD-10-CM | POA: Diagnosis not present

## 2020-09-23 DIAGNOSIS — G35 Multiple sclerosis: Secondary | ICD-10-CM | POA: Diagnosis not present

## 2020-09-23 DIAGNOSIS — M21371 Foot drop, right foot: Secondary | ICD-10-CM | POA: Diagnosis not present

## 2020-09-23 DIAGNOSIS — R5383 Other fatigue: Secondary | ICD-10-CM | POA: Diagnosis not present

## 2020-09-23 LAB — URINE CULTURE: Organism ID, Bacteria: NO GROWTH

## 2020-09-24 DIAGNOSIS — I10 Essential (primary) hypertension: Secondary | ICD-10-CM | POA: Diagnosis not present

## 2020-09-24 DIAGNOSIS — N319 Neuromuscular dysfunction of bladder, unspecified: Secondary | ICD-10-CM | POA: Diagnosis not present

## 2020-09-24 DIAGNOSIS — L89152 Pressure ulcer of sacral region, stage 2: Secondary | ICD-10-CM | POA: Diagnosis not present

## 2020-09-24 DIAGNOSIS — Z87891 Personal history of nicotine dependence: Secondary | ICD-10-CM | POA: Diagnosis not present

## 2020-09-24 DIAGNOSIS — G35 Multiple sclerosis: Secondary | ICD-10-CM | POA: Diagnosis not present

## 2020-09-24 DIAGNOSIS — N39498 Other specified urinary incontinence: Secondary | ICD-10-CM | POA: Diagnosis not present

## 2020-09-28 DIAGNOSIS — Z87891 Personal history of nicotine dependence: Secondary | ICD-10-CM | POA: Diagnosis not present

## 2020-09-28 DIAGNOSIS — L89152 Pressure ulcer of sacral region, stage 2: Secondary | ICD-10-CM | POA: Diagnosis not present

## 2020-09-28 DIAGNOSIS — N319 Neuromuscular dysfunction of bladder, unspecified: Secondary | ICD-10-CM | POA: Diagnosis not present

## 2020-09-28 DIAGNOSIS — N39498 Other specified urinary incontinence: Secondary | ICD-10-CM | POA: Diagnosis not present

## 2020-09-28 DIAGNOSIS — G35 Multiple sclerosis: Secondary | ICD-10-CM | POA: Diagnosis not present

## 2020-09-28 DIAGNOSIS — I10 Essential (primary) hypertension: Secondary | ICD-10-CM | POA: Diagnosis not present

## 2020-09-29 ENCOUNTER — Ambulatory Visit (INDEPENDENT_AMBULATORY_CARE_PROVIDER_SITE_OTHER): Payer: Medicare HMO

## 2020-09-29 DIAGNOSIS — I10 Essential (primary) hypertension: Secondary | ICD-10-CM | POA: Diagnosis not present

## 2020-09-29 DIAGNOSIS — L89152 Pressure ulcer of sacral region, stage 2: Secondary | ICD-10-CM | POA: Diagnosis not present

## 2020-09-29 DIAGNOSIS — N39498 Other specified urinary incontinence: Secondary | ICD-10-CM | POA: Diagnosis not present

## 2020-09-29 DIAGNOSIS — Z0000 Encounter for general adult medical examination without abnormal findings: Secondary | ICD-10-CM

## 2020-09-29 DIAGNOSIS — G35 Multiple sclerosis: Secondary | ICD-10-CM | POA: Diagnosis not present

## 2020-09-29 DIAGNOSIS — N319 Neuromuscular dysfunction of bladder, unspecified: Secondary | ICD-10-CM | POA: Diagnosis not present

## 2020-09-29 DIAGNOSIS — Z87891 Personal history of nicotine dependence: Secondary | ICD-10-CM | POA: Diagnosis not present
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
## 2020-09-29 NOTE — Patient Instructions (Signed)
[name] , Thank you for taking time to come for your Medicare Wellness Visit. I appreciate your ongoing commitment to your health goals. Please review the following plan we discussed and let me know if I can assist you in the future.   These are the goals we discussed: Goals    .  "I want to see a different neurologist"      [place] (see longitudinal plan of care for additional care plan information)  Current Barriers:  . Care Coordination needs related to neurology appointment in a patient with multiple sclerosis, depression, and hypothyroidism   Nurse Case Manager Clinical Goal(s):  [name] Over the next 10 days, patient will talk with neurology office about records release request . Over the next 90 days, patient will be scheduled with neurologist at [facility]  . Over the next 60 days, patient will reach out to [facility] referral dept or RNCM with ny questions or concerns regarding neurology referral  Interventions:  . Inter-disciplinary care team collaboration (see longitudinal plan of care) . Chart reviewed including recent office notes and referral notes . Talked with patient by telephone . Discussed that she is currently being treated by [facility] and that she is unhappy and doesn't feel that her doctor is listening to her . Discussed referral to [facility] [phone] . Encouraged patient to obtain records release so that [facility] can review records for scheduling . Reviewed and discussed medications . Discussed difficulty with sleep  o Per patient may be related to current increase in stress . Confirmed that patient has RNCM contact number and will reach out with any nursing questions or concerns  Patient Self Care Activities:  . Performs ADL's independently . Performs IADL's independently . Unable to independently drive  Initial goal documentation     .  Anxiety Symptoms Monitored and Managed       Timeframe: Short-Term   This Visit's Progress: On  Track   Priority: Medium   Start Date  07/28/20  Expected End Date  10/28/20  Completed Date  Patient Self Care Activities:  . Takes medications as prescribed . Attends scheduled medical appointments  Patient Coping Strengths:  . Has support from her parents . Has transport help as needed . Takes time to rest and relax as needed  Patient Self Care Deficits:  [name] Mobility issues . Daily challenges with managing MS symptoms faced  Patient Goals:  - spend time or talk with others every day - practice relaxation or meditation daily - keep a calendar with appointment dates  Follow Up Plan: LCSW to call client on 10/11/20 to assess client needs     .  CCM Goal: Fall Prevention      Increase in falls in patient with multiple sclerosis  Current Barriers:  . Chronic Disease Management support and education needs related to multiple sclerosis  Nurse Case Manager Clinical Goal(s):  [name] Over the next 60 days, patient will report no falls . Over the next 60 days, patient will talk with RN Care Manager regarding MS and fall risk  Interventions:  . Fall history discussed. Patient has MS and foot drop and often falls because of leg weakness.  . Discussed assistive devices. Patient uses a rolling walker and has shoe inserts for foot drop. She doesn't always wear the inserts in the winter because they are difficult to get into her winter boots . Discussed referral to a different neurologist, [facility] [phone] . Provided with CCM contact information  and encouraged to reach out as needed  Patient Self Care Activities:  . Performs ADL's independently . Performs IADL's independently . Unable to independently drive  Please see past updates related to this goal by clicking on the "Past Updates" button in the selected goal      .  Client will talk with LCSW in next 3 weeks to discuss stress/anxiety management for client (pt-stated)      Current Barriers:  . Client has MS (multiple  sclerosis) and has Chronic Diagnoses also of Depression, PTSD, and Hypothyroidism . Client has decreased energy . Decreased appetite  Clinical Social Work Clinical Goal(s):  [name] LCSW will talk with client in next 3 weeks about stress/anxiety management for client  Interventions: . Talked with client about pain issues of client . Talked with client about CCM program services . Encouraged client to talk with RNCM to discuss nursing needs of client . Talked with [facility] about her use of food stamps benefit . Talked with [place] about health needs of her sister . Talked with [place] about health needs of her mother . Talked with client about radiation treatments of client .   Patient Self Care Activities:   Attends classes, as able a, at [place] medications as prescribed.   Plan:  Client to attend scheduled client medical appointments Client to communicate with RNCM to discuss nursing needs of client LCSW to call client in next 3 weeks to talk with her about managing anxiety and stress issues of client  Initial goal documentation     .  DIET - INCREASE WATER INTAKE      Try to drink 6-8 glasses of water daily.    .  Weight (lb) < 160 lb (72.6 kg)      Eat 3 meals daily that consist of lean proteins, fruits and vegetables. Increase water intake Try to walk for at least 30 minutes 3 times weekly    .  Wound and Cellulitis Improvement      Timeframe:  Short-Term Goal Priority:  High Start Date:    08/10/20                         Expected End Date:     10/10/20                Follow-up: 09/07/20  . Keep all medical appointments . Work with home health nursing for wound care . Call PCP or seek appropriate medical attention for any new or worsening symptoms . Eat nutritious meals with protein to promote wound healing and stay hydrated . Call RN Care Manager as needed [phone]       This is a list of the screening recommended for you and due  dates:  Health Maintenance  Topic Date Due  . COVID-19 Vaccine (3 - Pfizer risk 4-dose series) 10/15/2020*  . Tetanus Vaccine  04/06/2021*  . Mammogram  11/11/2020  . Flu Shot  11/29/2020  . Pap Smear  06/02/2023  . Zoster (Shingles) Vaccine (1 of 2) 01/12/2026  . Hepatitis C Screening: USPSTF Recommendation to screen - Ages 39-79 yo.  Completed  . HIV Screening  Completed  . HPV Vaccine  Aged Out  *Topic was postponed. The date shown is not the original due date.

## 2020-09-30 ENCOUNTER — Other Ambulatory Visit: Payer: Self-pay | Admitting: Family Medicine

## 2020-09-30 DIAGNOSIS — G35 Multiple sclerosis: Secondary | ICD-10-CM

## 2020-10-01 DIAGNOSIS — I10 Essential (primary) hypertension: Secondary | ICD-10-CM | POA: Diagnosis not present

## 2020-10-01 DIAGNOSIS — R262 Difficulty in walking, not elsewhere classified: Secondary | ICD-10-CM | POA: Diagnosis not present

## 2020-10-01 DIAGNOSIS — R5383 Other fatigue: Secondary | ICD-10-CM | POA: Diagnosis not present

## 2020-10-01 DIAGNOSIS — R270 Ataxia, unspecified: Secondary | ICD-10-CM | POA: Diagnosis not present

## 2020-10-01 DIAGNOSIS — R202 Paresthesia of skin: Secondary | ICD-10-CM | POA: Diagnosis not present

## 2020-10-01 DIAGNOSIS — R4182 Altered mental status, unspecified: Secondary | ICD-10-CM | POA: Diagnosis not present

## 2020-10-01 DIAGNOSIS — N39498 Other specified urinary incontinence: Secondary | ICD-10-CM | POA: Diagnosis not present

## 2020-10-01 DIAGNOSIS — M6281 Muscle weakness (generalized): Secondary | ICD-10-CM | POA: Diagnosis not present

## 2020-10-01 DIAGNOSIS — G35 Multiple sclerosis: Secondary | ICD-10-CM | POA: Diagnosis not present

## 2020-10-01 DIAGNOSIS — L89152 Pressure ulcer of sacral region, stage 2: Secondary | ICD-10-CM | POA: Diagnosis not present

## 2020-10-01 DIAGNOSIS — Z87891 Personal history of nicotine dependence: Secondary | ICD-10-CM | POA: Diagnosis not present

## 2020-10-01 DIAGNOSIS — N319 Neuromuscular dysfunction of bladder, unspecified: Secondary | ICD-10-CM | POA: Diagnosis not present

## 2020-10-04 ENCOUNTER — Encounter (HOSPITAL_COMMUNITY): Payer: Self-pay | Admitting: Hematology

## 2020-10-04 DIAGNOSIS — G35 Multiple sclerosis: Secondary | ICD-10-CM | POA: Diagnosis not present

## 2020-10-04 DIAGNOSIS — N39498 Other specified urinary incontinence: Secondary | ICD-10-CM | POA: Diagnosis not present

## 2020-10-04 DIAGNOSIS — I10 Essential (primary) hypertension: Secondary | ICD-10-CM | POA: Diagnosis not present

## 2020-10-04 DIAGNOSIS — N319 Neuromuscular dysfunction of bladder, unspecified: Secondary | ICD-10-CM | POA: Diagnosis not present

## 2020-10-04 DIAGNOSIS — L89152 Pressure ulcer of sacral region, stage 2: Secondary | ICD-10-CM | POA: Diagnosis not present

## 2020-10-04 DIAGNOSIS — Z87891 Personal history of nicotine dependence: Secondary | ICD-10-CM | POA: Diagnosis not present

## 2020-10-05 DIAGNOSIS — S93691A Other sprain of right foot, initial encounter: Secondary | ICD-10-CM | POA: Diagnosis not present

## 2020-10-05 DIAGNOSIS — M79669 Pain in unspecified lower leg: Secondary | ICD-10-CM | POA: Diagnosis not present

## 2020-10-05 DIAGNOSIS — S8011XA Contusion of right lower leg, initial encounter: Secondary | ICD-10-CM | POA: Diagnosis not present

## 2020-10-05 DIAGNOSIS — W19XXXA Unspecified fall, initial encounter: Secondary | ICD-10-CM | POA: Diagnosis not present

## 2020-10-05 DIAGNOSIS — Z87891 Personal history of nicotine dependence: Secondary | ICD-10-CM | POA: Diagnosis not present

## 2020-10-05 DIAGNOSIS — M79673 Pain in unspecified foot: Secondary | ICD-10-CM | POA: Diagnosis not present

## 2020-10-05 DIAGNOSIS — G35 Multiple sclerosis: Secondary | ICD-10-CM | POA: Diagnosis not present

## 2020-10-05 DIAGNOSIS — S9031XA Contusion of right foot, initial encounter: Secondary | ICD-10-CM | POA: Diagnosis not present

## 2020-10-06 DIAGNOSIS — I10 Essential (primary) hypertension: Secondary | ICD-10-CM | POA: Diagnosis not present

## 2020-10-06 DIAGNOSIS — N319 Neuromuscular dysfunction of bladder, unspecified: Secondary | ICD-10-CM | POA: Diagnosis not present

## 2020-10-06 DIAGNOSIS — L89152 Pressure ulcer of sacral region, stage 2: Secondary | ICD-10-CM | POA: Diagnosis not present

## 2020-10-06 DIAGNOSIS — N39498 Other specified urinary incontinence: Secondary | ICD-10-CM | POA: Diagnosis not present

## 2020-10-06 DIAGNOSIS — G35 Multiple sclerosis: Secondary | ICD-10-CM | POA: Diagnosis not present

## 2020-10-06 DIAGNOSIS — Z87891 Personal history of nicotine dependence: Secondary | ICD-10-CM | POA: Diagnosis not present

## 2020-10-07 ENCOUNTER — Encounter: Payer: Self-pay | Admitting: Family Medicine

## 2020-10-07 ENCOUNTER — Ambulatory Visit (INDEPENDENT_AMBULATORY_CARE_PROVIDER_SITE_OTHER): Payer: Medicare HMO | Admitting: Family Medicine

## 2020-10-07 ENCOUNTER — Other Ambulatory Visit: Payer: Self-pay

## 2020-10-07 VITALS — BP 95/52 | HR 92 | Temp 97.6°F | Ht 65.0 in | Wt 200.0 lb

## 2020-10-07 DIAGNOSIS — N319 Neuromuscular dysfunction of bladder, unspecified: Secondary | ICD-10-CM | POA: Diagnosis not present

## 2020-10-07 DIAGNOSIS — S93421A Sprain of deltoid ligament of right ankle, initial encounter: Secondary | ICD-10-CM | POA: Diagnosis not present

## 2020-10-07 DIAGNOSIS — I10 Essential (primary) hypertension: Secondary | ICD-10-CM | POA: Diagnosis not present

## 2020-10-07 DIAGNOSIS — G35 Multiple sclerosis: Secondary | ICD-10-CM | POA: Diagnosis not present

## 2020-10-07 DIAGNOSIS — N39498 Other specified urinary incontinence: Secondary | ICD-10-CM | POA: Diagnosis not present

## 2020-10-07 DIAGNOSIS — Z87891 Personal history of nicotine dependence: Secondary | ICD-10-CM | POA: Diagnosis not present

## 2020-10-07 DIAGNOSIS — L89152 Pressure ulcer of sacral region, stage 2: Secondary | ICD-10-CM | POA: Diagnosis not present

## 2020-10-07 NOTE — Progress Notes (Signed)
Subjective:  Patient ID: Amber Williams, female    DOB: 08-22-1975  Age: 45 y.o. MRN: [id number]  CC: Follow-up   HPI Amber Williams presents for recheck of decubitus. Home health saw it this AM and said it is near resolution. Amber Williams denies discomfort.   She fell 2 days ago. Urgent care did XR of right ankle. She tells me they found no fractures, but it still hurts. She is in [facility] today.   Depression screen [facility] 2/9 10/07/2020 09/29/2020 09/21/2020  Decreased Interest 0 0 0  Down, Depressed, Hopeless 0 0 0  PHQ - 2 Score 0 0 0  Altered sleeping - - 0  Tired, decreased energy - - 1  Change in appetite - - 0  Feeling bad or failure about yourself  - - 0  Trouble concentrating - - 0  Moving slowly or fidgety/restless - - 0  Suicidal thoughts - - 0  PHQ-9 Score - - 1  Difficult doing work/chores - - Not difficult at all  Some recent data might be hidden    History Amber Williams has a past medical history of Anal cancer ([place]) (01/10/2019), Arthritis, Depression, DVT (deep venous thrombosis) ([place]) (2013), Essential hypertension, Family history of breast cancer, Gait abnormality (08/03/2016), GERD (gastroesophageal reflux disease) (08/03/2020), History of blood transfusion (2013), Hyperlipidemia, Hypothyroidism, Hypothyroidism, Multiple sclerosis ([place]), Neurogenic bladder (08/03/2016), Port-A-Cath in place (01/23/2019), PTSD (post-traumatic stress disorder), PTSD (post-traumatic stress disorder), Right foot drop (08/03/2016), Seasonal allergies, and Ulcer.   She has a past surgical history that includes Colon surgery; Gastric bypass (2010); Interstim Implant placement (2014); Portacath placement; Hemorrhoid surgery (N/A, 12/25/2018); Flexible sigmoidoscopy (N/A, 06/25/2019); Colonoscopy with propofol (N/A, 03/04/2020); and Colonoscopy (N/A, 07/06/2020).   Her family history includes Breast cancer (age of onset: 84) in her sister; Breast cancer (age of onset: 35) in her mother; Cancer in her paternal grandmother;  Diabetes in her father, maternal grandmother, and paternal grandmother; Heart disease in her maternal grandfather, maternal grandmother, and paternal grandfather; Hypertension in her father and sister; Miscarriages / Stillbirths in her sister; Stroke in her paternal grandfather.She reports that she quit smoking about 11 years ago. Her smoking use included cigarettes. She started smoking about 29 years ago. She has a 22.50 pack-year smoking history. She has never used smokeless tobacco. She reports previous alcohol use. She reports that she does not use drugs.    ROS Review of Systems  Constitutional: Negative.   HENT: Negative.    Eyes:  Negative for visual disturbance.  Respiratory:  Negative for shortness of breath.   Cardiovascular:  Negative for chest pain.  Gastrointestinal:  Negative for abdominal pain.  Musculoskeletal:  Positive for arthralgias (right ankle).   Objective:  BP (!) 95/52   Pulse 92   Temp 97.6 F (36.4 C)   Ht 5\' 5"  (1.651 m)   Wt 200 lb (90.7 kg)   SpO2 98%   BMI 33.28 kg/m   BP Readings from Last 3 Encounters:  10/07/20 (!) 95/52  09/21/20 99/68  08/30/20 (!) 86/63    Wt Readings from Last 3 Encounters:  10/07/20 200 lb (90.7 kg)  09/21/20 202 lb 9.6 oz (91.9 kg)  08/18/20 200 lb (90.7 kg)     Physical Exam Constitutional:      Appearance: She is well-developed.  HENT:     Head: Normocephalic.  Cardiovascular:     Rate and Rhythm: Normal rate and regular rhythm.     Heart sounds: No murmur heard. Pulmonary:  Effort: Pulmonary effort is normal.     Breath sounds: Normal breath sounds.  Musculoskeletal:        General: Tenderness present.      Assessment & Plan:   Amber Williams was seen today for follow-up.  Diagnoses and all orders for this visit:  Sprain of deltoid ligament of right ankle, initial encounter    Cast boot fitted.  Patient should wear that for least the next 2 weeks.  I have discontinued Negar D. Detjen's  ciprofloxacin. [name] Vitamin D3, Cyanocobalamin, Allergy Relief, traZODone, aspirin, omeprazole, multivitamin with minerals, nystatin, NyQuil Severe Cold/Flu, (feeding supplement) PROSource Plus, feeding supplement, nutrition supplement (JUVEN), traMADol, flurazepam, levonorgestrel-ethinyl estradiol, levothyroxine, DuoDERM CGF Dressing, escitalopram, ascorbic acid, Vita C/Bioflavonoids/Rose Hips, furosemide, and amitriptyline.  Allergies as of 10/07/2020       Reactions   Other Hives   Plastic Tape   Tecfidera [dimethyl Fumarate] Itching        Medication List        Accurate as of October 07, 2020 11:59 PM. If you have any questions, ask your nurse or doctor.          STOP taking these medications    ciprofloxacin 500 MG tablet Commonly known as: Cipro Stopped by: [name], MD       TAKE these medications    (feeding supplement) PROSource Plus liquid Take 30 mLs by mouth daily.   nutrition supplement (JUVEN) Pack Take 1 packet by mouth 2 (two) times daily between meals.   feeding supplement Liqd Take 237 mLs by mouth daily.   Allergy Relief 180 MG tablet Generic drug: fexofenadine Take 180 mg by mouth daily as needed for allergies.   amitriptyline 50 MG tablet Commonly known as: ELAVIL TAKE 1 TABLET AT BEDTIME   ascorbic acid 1000 MG tablet Commonly known as: VITAMIN C Take 1 tablet by mouth daily.   aspirin 81 MG EC tablet Take 81 mg by mouth at bedtime.   Cyanocobalamin 5000 MCG Tbdp Take 5,000 mcg by mouth daily.   DuoDERM CGF Dressing Misc Apply every 3 1/2 days.   escitalopram 10 MG tablet Commonly known as: LEXAPRO TAKE 1 TABLET DAILY   flurazepam 30 MG capsule Commonly known as: DALMANE Take 1 capsule (30 mg total) by mouth at bedtime.   furosemide 20 MG tablet Commonly known as: LASIX Take by mouth.   levonorgestrel-ethinyl estradiol 0.15-30 MG-MCG tablet Commonly known as: Altavera Take 1 tablet by mouth  daily.   levothyroxine 125 MCG tablet Commonly known as: SYNTHROID Take 1 tablet (125 mcg total) by mouth daily before breakfast.   multivitamin with minerals tablet Take 1 tablet by mouth daily. One a day woman   NyQuil Severe Cold/Flu 5-6.25-10-325 MG/15ML Liqd Generic drug: Phenyleph-Doxylamine-DM-APAP Take 30 mLs by mouth at bedtime as needed (cold/cough).   nystatin powder Commonly known as: MYCOSTATIN/NYSTOP Apply 1 application topically in the morning and at bedtime.   omeprazole 20 MG capsule Commonly known as: PRILOSEC Take 1 capsule (20 mg total) by mouth daily.   traMADol 50 MG tablet Commonly known as: ULTRAM Take 1 tablet (50 mg total) by mouth every 12 (twelve) hours as needed for moderate pain or severe pain.   traZODone 150 MG tablet Commonly known as: DESYREL USE FROM 1/3 TO 1 TABLET ASNEEDED FOR SLEEP What changed:  how much to take how to take this when to take this additional instructions   Vita C/Bioflavonoids/Rose Hips 1000-30-18 MG Tabs Take 1  tablet by mouth daily.   Vitamin D3 125 MCG (5000 UT) Caps Take 5,000 Units by mouth every morning.         Follow-up: No follow-ups on file.  [name], M.D.

## 2020-10-08 ENCOUNTER — Encounter (HOSPITAL_COMMUNITY): Payer: Self-pay | Admitting: Hematology

## 2020-10-08 DIAGNOSIS — I10 Essential (primary) hypertension: Secondary | ICD-10-CM | POA: Diagnosis not present

## 2020-10-08 DIAGNOSIS — N319 Neuromuscular dysfunction of bladder, unspecified: Secondary | ICD-10-CM | POA: Diagnosis not present

## 2020-10-08 DIAGNOSIS — Z87891 Personal history of nicotine dependence: Secondary | ICD-10-CM | POA: Diagnosis not present

## 2020-10-08 DIAGNOSIS — N39498 Other specified urinary incontinence: Secondary | ICD-10-CM | POA: Diagnosis not present

## 2020-10-08 DIAGNOSIS — G35 Multiple sclerosis: Secondary | ICD-10-CM | POA: Diagnosis not present

## 2020-10-08 DIAGNOSIS — L89152 Pressure ulcer of sacral region, stage 2: Secondary | ICD-10-CM | POA: Diagnosis not present

## 2020-10-10 ENCOUNTER — Encounter: Payer: Self-pay | Admitting: Family Medicine

## 2020-10-11 ENCOUNTER — Telehealth: Payer: Medicare HMO

## 2020-10-11 DIAGNOSIS — N319 Neuromuscular dysfunction of bladder, unspecified: Secondary | ICD-10-CM | POA: Diagnosis not present

## 2020-10-11 DIAGNOSIS — G35 Multiple sclerosis: Secondary | ICD-10-CM | POA: Diagnosis not present

## 2020-10-11 DIAGNOSIS — I10 Essential (primary) hypertension: Secondary | ICD-10-CM | POA: Diagnosis not present

## 2020-10-11 DIAGNOSIS — N39498 Other specified urinary incontinence: Secondary | ICD-10-CM | POA: Diagnosis not present

## 2020-10-11 DIAGNOSIS — L89152 Pressure ulcer of sacral region, stage 2: Secondary | ICD-10-CM | POA: Diagnosis not present

## 2020-10-11 DIAGNOSIS — Z87891 Personal history of nicotine dependence: Secondary | ICD-10-CM | POA: Diagnosis not present

## 2020-10-13 DIAGNOSIS — N39498 Other specified urinary incontinence: Secondary | ICD-10-CM | POA: Diagnosis not present

## 2020-10-13 DIAGNOSIS — G35 Multiple sclerosis: Secondary | ICD-10-CM | POA: Diagnosis not present

## 2020-10-13 DIAGNOSIS — Z87891 Personal history of nicotine dependence: Secondary | ICD-10-CM | POA: Diagnosis not present

## 2020-10-13 DIAGNOSIS — I10 Essential (primary) hypertension: Secondary | ICD-10-CM | POA: Diagnosis not present

## 2020-10-13 DIAGNOSIS — L89152 Pressure ulcer of sacral region, stage 2: Secondary | ICD-10-CM | POA: Diagnosis not present

## 2020-10-13 DIAGNOSIS — N319 Neuromuscular dysfunction of bladder, unspecified: Secondary | ICD-10-CM | POA: Diagnosis not present

## 2020-10-14 DIAGNOSIS — G35 Multiple sclerosis: Secondary | ICD-10-CM | POA: Diagnosis not present

## 2020-10-14 DIAGNOSIS — N319 Neuromuscular dysfunction of bladder, unspecified: Secondary | ICD-10-CM | POA: Diagnosis not present

## 2020-10-14 DIAGNOSIS — N39498 Other specified urinary incontinence: Secondary | ICD-10-CM | POA: Diagnosis not present

## 2020-10-14 DIAGNOSIS — I10 Essential (primary) hypertension: Secondary | ICD-10-CM | POA: Diagnosis not present

## 2020-10-14 DIAGNOSIS — L89152 Pressure ulcer of sacral region, stage 2: Secondary | ICD-10-CM | POA: Diagnosis not present

## 2020-10-14 DIAGNOSIS — Z87891 Personal history of nicotine dependence: Secondary | ICD-10-CM | POA: Diagnosis not present

## 2020-10-18 DIAGNOSIS — N319 Neuromuscular dysfunction of bladder, unspecified: Secondary | ICD-10-CM | POA: Diagnosis not present

## 2020-10-18 DIAGNOSIS — I10 Essential (primary) hypertension: Secondary | ICD-10-CM | POA: Diagnosis not present

## 2020-10-18 DIAGNOSIS — Z87891 Personal history of nicotine dependence: Secondary | ICD-10-CM | POA: Diagnosis not present

## 2020-10-18 DIAGNOSIS — N39498 Other specified urinary incontinence: Secondary | ICD-10-CM | POA: Diagnosis not present

## 2020-10-18 DIAGNOSIS — L89152 Pressure ulcer of sacral region, stage 2: Secondary | ICD-10-CM | POA: Diagnosis not present

## 2020-10-18 DIAGNOSIS — G35 Multiple sclerosis: Secondary | ICD-10-CM | POA: Diagnosis not present

## 2020-10-19 DIAGNOSIS — G35 Multiple sclerosis: Secondary | ICD-10-CM | POA: Diagnosis not present

## 2020-10-19 DIAGNOSIS — N319 Neuromuscular dysfunction of bladder, unspecified: Secondary | ICD-10-CM | POA: Diagnosis not present

## 2020-10-19 DIAGNOSIS — Z87891 Personal history of nicotine dependence: Secondary | ICD-10-CM | POA: Diagnosis not present

## 2020-10-19 DIAGNOSIS — N39498 Other specified urinary incontinence: Secondary | ICD-10-CM | POA: Diagnosis not present

## 2020-10-19 DIAGNOSIS — I10 Essential (primary) hypertension: Secondary | ICD-10-CM | POA: Diagnosis not present

## 2020-10-19 DIAGNOSIS — L89152 Pressure ulcer of sacral region, stage 2: Secondary | ICD-10-CM | POA: Diagnosis not present

## 2020-10-21 DIAGNOSIS — G35 Multiple sclerosis: Secondary | ICD-10-CM | POA: Diagnosis not present

## 2020-10-21 DIAGNOSIS — Z87891 Personal history of nicotine dependence: Secondary | ICD-10-CM | POA: Diagnosis not present

## 2020-10-21 DIAGNOSIS — I10 Essential (primary) hypertension: Secondary | ICD-10-CM | POA: Diagnosis not present

## 2020-10-21 DIAGNOSIS — N319 Neuromuscular dysfunction of bladder, unspecified: Secondary | ICD-10-CM | POA: Diagnosis not present

## 2020-10-21 DIAGNOSIS — N39498 Other specified urinary incontinence: Secondary | ICD-10-CM | POA: Diagnosis not present

## 2020-10-21 DIAGNOSIS — L89152 Pressure ulcer of sacral region, stage 2: Secondary | ICD-10-CM | POA: Diagnosis not present

## 2020-10-22 DIAGNOSIS — N39498 Other specified urinary incontinence: Secondary | ICD-10-CM | POA: Diagnosis not present

## 2020-10-22 DIAGNOSIS — Z87891 Personal history of nicotine dependence: Secondary | ICD-10-CM | POA: Diagnosis not present

## 2020-10-22 DIAGNOSIS — G35 Multiple sclerosis: Secondary | ICD-10-CM | POA: Diagnosis not present

## 2020-10-22 DIAGNOSIS — L89152 Pressure ulcer of sacral region, stage 2: Secondary | ICD-10-CM | POA: Diagnosis not present

## 2020-10-22 DIAGNOSIS — I10 Essential (primary) hypertension: Secondary | ICD-10-CM | POA: Diagnosis not present

## 2020-10-22 DIAGNOSIS — N319 Neuromuscular dysfunction of bladder, unspecified: Secondary | ICD-10-CM | POA: Diagnosis not present

## 2020-10-25 DIAGNOSIS — I10 Essential (primary) hypertension: Secondary | ICD-10-CM | POA: Diagnosis not present

## 2020-10-25 DIAGNOSIS — N39498 Other specified urinary incontinence: Secondary | ICD-10-CM | POA: Diagnosis not present

## 2020-10-25 DIAGNOSIS — G35 Multiple sclerosis: Secondary | ICD-10-CM | POA: Diagnosis not present

## 2020-10-25 DIAGNOSIS — N319 Neuromuscular dysfunction of bladder, unspecified: Secondary | ICD-10-CM | POA: Diagnosis not present

## 2020-10-25 DIAGNOSIS — L89152 Pressure ulcer of sacral region, stage 2: Secondary | ICD-10-CM | POA: Diagnosis not present

## 2020-10-25 DIAGNOSIS — Z87891 Personal history of nicotine dependence: Secondary | ICD-10-CM | POA: Diagnosis not present

## 2020-10-26 DIAGNOSIS — N319 Neuromuscular dysfunction of bladder, unspecified: Secondary | ICD-10-CM | POA: Diagnosis not present

## 2020-10-26 DIAGNOSIS — I10 Essential (primary) hypertension: Secondary | ICD-10-CM | POA: Diagnosis not present

## 2020-10-26 DIAGNOSIS — N39498 Other specified urinary incontinence: Secondary | ICD-10-CM | POA: Diagnosis not present

## 2020-10-26 DIAGNOSIS — G35 Multiple sclerosis: Secondary | ICD-10-CM | POA: Diagnosis not present

## 2020-10-26 DIAGNOSIS — Z87891 Personal history of nicotine dependence: Secondary | ICD-10-CM | POA: Diagnosis not present

## 2020-10-26 DIAGNOSIS — L89152 Pressure ulcer of sacral region, stage 2: Secondary | ICD-10-CM | POA: Diagnosis not present

## 2020-10-28 DIAGNOSIS — G35 Multiple sclerosis: Secondary | ICD-10-CM | POA: Diagnosis not present

## 2020-10-28 DIAGNOSIS — L89152 Pressure ulcer of sacral region, stage 2: Secondary | ICD-10-CM | POA: Diagnosis not present

## 2020-10-28 DIAGNOSIS — I10 Essential (primary) hypertension: Secondary | ICD-10-CM | POA: Diagnosis not present

## 2020-10-28 DIAGNOSIS — N319 Neuromuscular dysfunction of bladder, unspecified: Secondary | ICD-10-CM | POA: Diagnosis not present

## 2020-10-28 DIAGNOSIS — N39498 Other specified urinary incontinence: Secondary | ICD-10-CM | POA: Diagnosis not present

## 2020-10-28 DIAGNOSIS — Z87891 Personal history of nicotine dependence: Secondary | ICD-10-CM | POA: Diagnosis not present

## 2020-10-29 DIAGNOSIS — R350 Frequency of micturition: Secondary | ICD-10-CM | POA: Diagnosis not present

## 2020-10-29 DIAGNOSIS — N390 Urinary tract infection, site not specified: Secondary | ICD-10-CM | POA: Diagnosis not present

## 2020-11-02 DIAGNOSIS — N319 Neuromuscular dysfunction of bladder, unspecified: Secondary | ICD-10-CM | POA: Diagnosis not present

## 2020-11-02 DIAGNOSIS — L89152 Pressure ulcer of sacral region, stage 2: Secondary | ICD-10-CM | POA: Diagnosis not present

## 2020-11-02 DIAGNOSIS — N39498 Other specified urinary incontinence: Secondary | ICD-10-CM | POA: Diagnosis not present

## 2020-11-02 DIAGNOSIS — I10 Essential (primary) hypertension: Secondary | ICD-10-CM | POA: Diagnosis not present

## 2020-11-02 DIAGNOSIS — Z87891 Personal history of nicotine dependence: Secondary | ICD-10-CM | POA: Diagnosis not present

## 2020-11-02 DIAGNOSIS — G35 Multiple sclerosis: Secondary | ICD-10-CM | POA: Diagnosis not present

## 2020-11-04 DIAGNOSIS — I10 Essential (primary) hypertension: Secondary | ICD-10-CM | POA: Diagnosis not present

## 2020-11-04 DIAGNOSIS — N319 Neuromuscular dysfunction of bladder, unspecified: Secondary | ICD-10-CM | POA: Diagnosis not present

## 2020-11-04 DIAGNOSIS — L89152 Pressure ulcer of sacral region, stage 2: Secondary | ICD-10-CM | POA: Diagnosis not present

## 2020-11-04 DIAGNOSIS — N39498 Other specified urinary incontinence: Secondary | ICD-10-CM | POA: Diagnosis not present

## 2020-11-04 DIAGNOSIS — Z87891 Personal history of nicotine dependence: Secondary | ICD-10-CM | POA: Diagnosis not present

## 2020-11-04 DIAGNOSIS — G35 Multiple sclerosis: Secondary | ICD-10-CM | POA: Diagnosis not present

## 2020-11-05 DIAGNOSIS — G35 Multiple sclerosis: Secondary | ICD-10-CM | POA: Diagnosis not present

## 2020-11-05 DIAGNOSIS — M6281 Muscle weakness (generalized): Secondary | ICD-10-CM | POA: Diagnosis not present

## 2020-11-05 DIAGNOSIS — G5781 Other specified mononeuropathies of right lower limb: Secondary | ICD-10-CM | POA: Diagnosis not present

## 2020-11-08 DIAGNOSIS — N39498 Other specified urinary incontinence: Secondary | ICD-10-CM | POA: Diagnosis not present

## 2020-11-08 DIAGNOSIS — L89152 Pressure ulcer of sacral region, stage 2: Secondary | ICD-10-CM | POA: Diagnosis not present

## 2020-11-08 DIAGNOSIS — Z87891 Personal history of nicotine dependence: Secondary | ICD-10-CM | POA: Diagnosis not present

## 2020-11-08 DIAGNOSIS — G35 Multiple sclerosis: Secondary | ICD-10-CM | POA: Diagnosis not present

## 2020-11-08 DIAGNOSIS — N319 Neuromuscular dysfunction of bladder, unspecified: Secondary | ICD-10-CM | POA: Diagnosis not present

## 2020-11-08 DIAGNOSIS — I10 Essential (primary) hypertension: Secondary | ICD-10-CM | POA: Diagnosis not present

## 2020-11-11 DIAGNOSIS — Z87891 Personal history of nicotine dependence: Secondary | ICD-10-CM | POA: Diagnosis not present

## 2020-11-11 DIAGNOSIS — N319 Neuromuscular dysfunction of bladder, unspecified: Secondary | ICD-10-CM | POA: Diagnosis not present

## 2020-11-11 DIAGNOSIS — L89152 Pressure ulcer of sacral region, stage 2: Secondary | ICD-10-CM | POA: Diagnosis not present

## 2020-11-11 DIAGNOSIS — G35 Multiple sclerosis: Secondary | ICD-10-CM | POA: Diagnosis not present

## 2020-11-11 DIAGNOSIS — N39498 Other specified urinary incontinence: Secondary | ICD-10-CM | POA: Diagnosis not present

## 2020-11-11 DIAGNOSIS — I10 Essential (primary) hypertension: Secondary | ICD-10-CM | POA: Diagnosis not present

## 2020-11-15 DIAGNOSIS — N39498 Other specified urinary incontinence: Secondary | ICD-10-CM | POA: Diagnosis not present

## 2020-11-15 DIAGNOSIS — I10 Essential (primary) hypertension: Secondary | ICD-10-CM | POA: Diagnosis not present

## 2020-11-15 DIAGNOSIS — N319 Neuromuscular dysfunction of bladder, unspecified: Secondary | ICD-10-CM | POA: Diagnosis not present

## 2020-11-15 DIAGNOSIS — Z87891 Personal history of nicotine dependence: Secondary | ICD-10-CM | POA: Diagnosis not present

## 2020-11-15 DIAGNOSIS — G35 Multiple sclerosis: Secondary | ICD-10-CM | POA: Diagnosis not present

## 2020-11-15 DIAGNOSIS — L89152 Pressure ulcer of sacral region, stage 2: Secondary | ICD-10-CM | POA: Diagnosis not present
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
## 2020-11-16 ENCOUNTER — Other Ambulatory Visit: Payer: Self-pay

## 2020-11-16 ENCOUNTER — Ambulatory Visit (INDEPENDENT_AMBULATORY_CARE_PROVIDER_SITE_OTHER): Payer: Medicare HMO | Admitting: Urology

## 2020-11-16 ENCOUNTER — Encounter: Payer: Self-pay | Admitting: Urology

## 2020-11-16 VITALS — BP 96/56 | HR 115

## 2020-11-16 DIAGNOSIS — N319 Neuromuscular dysfunction of bladder, unspecified: Secondary | ICD-10-CM

## 2020-11-16 DIAGNOSIS — R32 Unspecified urinary incontinence: Secondary | ICD-10-CM | POA: Diagnosis not present

## 2020-11-16 DIAGNOSIS — N3000 Acute cystitis without hematuria: Secondary | ICD-10-CM

## 2020-11-16 LAB — URINALYSIS, ROUTINE W REFLEX MICROSCOPIC
Bilirubin, UA: NEGATIVE
Glucose, UA: NEGATIVE
Nitrite, UA: POSITIVE — AB
Specific Gravity, UA: 1.025 (ref 1.005–1.030)
Urobilinogen, Ur: 0.2 mg/dL (ref 0.2–1.0)
pH, UA: 6 (ref 5.0–7.5)

## 2020-11-16 LAB — MICROSCOPIC EXAMINATION
Renal Epithel, UA: NONE SEEN /hpf
WBC, UA: >30 /hpf — AB (ref 0–5)

## 2020-11-16 LAB — BLADDER SCAN AMB NON-IMAGING: Scan Result: 216

## 2020-11-16 NOTE — Progress Notes (Signed)
Urological Symptom Review  Patient is experiencing the following symptoms: Frequent urination Hard to postpone urination Leakage of urine Trouble starting stream Urinary tract infection  PVR 216  Patient has interstim    Review of Systems  Gastrointestinal (upper)  : Indigestion/heartburn  Gastrointestinal (lower) : Constipation  Constitutional : Fatigue  Skin: Negative for skin symptoms  Eyes: Blurred vision  Ear/Nose/Throat : Negative for Ear/Nose/Throat symptoms  Hematologic/Lymphatic: Easy bruising  Cardiovascular : Negative for cardiovascular symptoms  Respiratory : Cough  Endocrine: Excessive thirst  Musculoskeletal: Joint pain  Neurological: Negative for neurological symptoms  Psychologic: Negative for psychiatric symptoms

## 2020-11-18 DIAGNOSIS — N319 Neuromuscular dysfunction of bladder, unspecified: Secondary | ICD-10-CM | POA: Diagnosis not present

## 2020-11-18 DIAGNOSIS — G35 Multiple sclerosis: Secondary | ICD-10-CM | POA: Diagnosis not present

## 2020-11-18 DIAGNOSIS — L89152 Pressure ulcer of sacral region, stage 2: Secondary | ICD-10-CM | POA: Diagnosis not present

## 2020-11-18 DIAGNOSIS — I10 Essential (primary) hypertension: Secondary | ICD-10-CM | POA: Diagnosis not present

## 2020-11-18 DIAGNOSIS — Z87891 Personal history of nicotine dependence: Secondary | ICD-10-CM | POA: Diagnosis not present

## 2020-11-18 DIAGNOSIS — N39498 Other specified urinary incontinence: Secondary | ICD-10-CM | POA: Diagnosis not present

## 2020-11-19 DIAGNOSIS — G35 Multiple sclerosis: Secondary | ICD-10-CM | POA: Diagnosis not present

## 2020-11-19 LAB — URINE CULTURE

## 2020-11-22 DIAGNOSIS — Z87891 Personal history of nicotine dependence: Secondary | ICD-10-CM | POA: Diagnosis not present

## 2020-11-22 DIAGNOSIS — N319 Neuromuscular dysfunction of bladder, unspecified: Secondary | ICD-10-CM | POA: Diagnosis not present

## 2020-11-22 DIAGNOSIS — G35 Multiple sclerosis: Secondary | ICD-10-CM | POA: Diagnosis not present

## 2020-11-22 DIAGNOSIS — Z9181 History of falling: Secondary | ICD-10-CM | POA: Diagnosis not present

## 2020-11-22 DIAGNOSIS — I10 Essential (primary) hypertension: Secondary | ICD-10-CM | POA: Diagnosis not present

## 2020-11-22 DIAGNOSIS — L89152 Pressure ulcer of sacral region, stage 2: Secondary | ICD-10-CM | POA: Diagnosis not present

## 2020-11-22 DIAGNOSIS — N39498 Other specified urinary incontinence: Secondary | ICD-10-CM | POA: Diagnosis not present

## 2020-11-23 ENCOUNTER — Telehealth: Payer: Self-pay

## 2020-11-23 ENCOUNTER — Other Ambulatory Visit: Payer: Self-pay | Admitting: Urology

## 2020-11-23 DIAGNOSIS — N3000 Acute cystitis without hematuria: Secondary | ICD-10-CM

## 2020-11-23 MED ORDER — NITROFURANTOIN MONOHYD MACRO 100 MG PO CAPS: 100.0000 mg | ORAL_CAPSULE | Freq: Two times a day (BID) | ORAL | 0 refills | Status: DC

## 2020-11-23 NOTE — Telephone Encounter (Signed)
-----   Message from Franchot Gallo, MD sent at 11/23/2020 11:49 AM EDT ----- Urine culture was positive.  I would like to send in Macrobid 1 p.o. twice daily #14.  I cannot do that as the computer will not let me.  Please call in ----- Message ----- From: Iris Pert, LPN Sent: [id number]   9:11 AM EDT To: Franchot Gallo, MD  Please review

## 2020-11-23 NOTE — Telephone Encounter (Signed)
Rx sent. Patient called and made aware. 

## 2020-11-24 DIAGNOSIS — G35 Multiple sclerosis: Secondary | ICD-10-CM | POA: Diagnosis not present

## 2020-11-24 DIAGNOSIS — Z5112 Encounter for antineoplastic immunotherapy: Secondary | ICD-10-CM | POA: Diagnosis not present

## 2020-11-25 DIAGNOSIS — N39498 Other specified urinary incontinence: Secondary | ICD-10-CM | POA: Diagnosis not present

## 2020-11-25 DIAGNOSIS — I10 Essential (primary) hypertension: Secondary | ICD-10-CM | POA: Diagnosis not present

## 2020-11-25 DIAGNOSIS — N319 Neuromuscular dysfunction of bladder, unspecified: Secondary | ICD-10-CM | POA: Diagnosis not present

## 2020-11-25 DIAGNOSIS — Z87891 Personal history of nicotine dependence: Secondary | ICD-10-CM | POA: Diagnosis not present

## 2020-11-25 DIAGNOSIS — L89152 Pressure ulcer of sacral region, stage 2: Secondary | ICD-10-CM | POA: Diagnosis not present

## 2020-11-25 DIAGNOSIS — Z9181 History of falling: Secondary | ICD-10-CM | POA: Diagnosis not present

## 2020-11-25 DIAGNOSIS — G35 Multiple sclerosis: Secondary | ICD-10-CM | POA: Diagnosis not present

## 2020-11-29 DIAGNOSIS — I10 Essential (primary) hypertension: Secondary | ICD-10-CM | POA: Diagnosis not present

## 2020-11-29 DIAGNOSIS — N39498 Other specified urinary incontinence: Secondary | ICD-10-CM | POA: Diagnosis not present

## 2020-11-29 DIAGNOSIS — G35 Multiple sclerosis: Secondary | ICD-10-CM | POA: Diagnosis not present

## 2020-11-29 DIAGNOSIS — Z9181 History of falling: Secondary | ICD-10-CM | POA: Diagnosis not present

## 2020-11-29 DIAGNOSIS — Z87891 Personal history of nicotine dependence: Secondary | ICD-10-CM | POA: Diagnosis not present

## 2020-11-29 DIAGNOSIS — N319 Neuromuscular dysfunction of bladder, unspecified: Secondary | ICD-10-CM | POA: Diagnosis not present

## 2020-11-29 DIAGNOSIS — L89152 Pressure ulcer of sacral region, stage 2: Secondary | ICD-10-CM | POA: Diagnosis not present

## 2020-12-02 DIAGNOSIS — N39498 Other specified urinary incontinence: Secondary | ICD-10-CM | POA: Diagnosis not present

## 2020-12-02 DIAGNOSIS — Z87891 Personal history of nicotine dependence: Secondary | ICD-10-CM | POA: Diagnosis not present

## 2020-12-02 DIAGNOSIS — N319 Neuromuscular dysfunction of bladder, unspecified: Secondary | ICD-10-CM | POA: Diagnosis not present

## 2020-12-02 DIAGNOSIS — I10 Essential (primary) hypertension: Secondary | ICD-10-CM | POA: Diagnosis not present

## 2020-12-02 DIAGNOSIS — Z9181 History of falling: Secondary | ICD-10-CM | POA: Diagnosis not present

## 2020-12-02 DIAGNOSIS — L89152 Pressure ulcer of sacral region, stage 2: Secondary | ICD-10-CM | POA: Diagnosis not present

## 2020-12-02 DIAGNOSIS — G35 Multiple sclerosis: Secondary | ICD-10-CM | POA: Diagnosis not present

## 2020-12-03 ENCOUNTER — Encounter: Payer: Self-pay | Admitting: Family Medicine

## 2020-12-03 ENCOUNTER — Ambulatory Visit (INDEPENDENT_AMBULATORY_CARE_PROVIDER_SITE_OTHER): Payer: Medicare HMO | Admitting: Family Medicine

## 2020-12-03 ENCOUNTER — Other Ambulatory Visit: Payer: Self-pay

## 2020-12-03 VITALS — BP 110/76 | HR 107 | Temp 98.0°F | Ht 65.0 in | Wt 190.6 lb

## 2020-12-03 DIAGNOSIS — E039 Hypothyroidism, unspecified: Secondary | ICD-10-CM | POA: Diagnosis not present

## 2020-12-03 DIAGNOSIS — G35 Multiple sclerosis: Secondary | ICD-10-CM

## 2020-12-03 MED ORDER — AMITRIPTYLINE HCL 10 MG PO TABS: ORAL_TABLET | ORAL | 0 refills | Status: DC

## 2020-12-03 NOTE — Progress Notes (Addendum)
Subjective:  Patient ID: Amber Williams, female    DOB: 1975-11-30  Age: 45 y.o. MRN: [id number]  CC: No chief complaint on file.   HPI Amber Williams presents for weekly episodes gets shaking chills, gets really cold. They last about 30 minutes. Back to normal after. Fever 99 to 107. Occurring weekly for a couple of months.  Stays cold all the time. Has AM cough all morning. Dry cough.Losing hair, losing weight. No energy. Onset a few months ago. . Depression screen [facility] 2/9 12/03/2020 10/07/2020 09/29/2020  Decreased Interest 0 0 0  Down, Depressed, Hopeless 0 0 0  PHQ - 2 Score 0 0 0  Altered sleeping 1 - -  Tired, decreased energy 0 - -  Change in appetite 1 - -  Feeling bad or failure about yourself  0 - -  Trouble concentrating 0 - -  Moving slowly or fidgety/restless 0 - -  Suicidal thoughts 0 - -  PHQ-9 Score 2 - -  Difficult doing work/chores Not difficult at all - -  Some recent data might be hidden    History Amber Williams has a past medical history of Anal cancer ([place]) (01/10/2019), Arthritis, Depression, DVT (deep venous thrombosis) ([place]) (2013), Essential hypertension, Family history of breast cancer, Gait abnormality (08/03/2016), GERD (gastroesophageal reflux disease) (08/03/2020), History of blood transfusion (2013), Hyperlipidemia, Hypothyroidism, Hypothyroidism, Multiple sclerosis ([place]), Neurogenic bladder (08/03/2016), Port-A-Cath in place (01/23/2019), PTSD (post-traumatic stress disorder), PTSD (post-traumatic stress disorder), Right foot drop (08/03/2016), Seasonal allergies, and Ulcer.   She has a past surgical history that includes Colon surgery; Gastric bypass (2010); Interstim Implant placement (2014); Portacath placement; Hemorrhoid surgery (N/A, 12/25/2018); Flexible sigmoidoscopy (N/A, 06/25/2019); Colonoscopy with propofol (N/A, 03/04/2020); and Colonoscopy (N/A, 07/06/2020).   Her family history includes Breast cancer (age of onset: 25) in her sister; Breast cancer (age of  onset: 70) in her mother; Cancer in her paternal grandmother; Diabetes in her father, maternal grandmother, and paternal grandmother; Heart disease in her maternal grandfather, maternal grandmother, and paternal grandfather; Hypertension in her father and sister; Miscarriages / Stillbirths in her sister; Stroke in her paternal grandfather.She reports that she quit smoking about 11 years ago. Her smoking use included cigarettes. She started smoking about 29 years ago. She has a 22.50 pack-year smoking history. She has never used smokeless tobacco. She reports that she does not currently use alcohol. She reports that she does not use drugs.    ROS Review of Systems  Constitutional:  Positive for activity change, chills, fatigue and fever.  HENT: Negative.    Respiratory:  Negative for shortness of breath.   Cardiovascular:  Negative for chest pain.  Musculoskeletal:  Positive for arthralgias, gait problem and myalgias.   Objective:  BP 110/76   Pulse (!) 107   Temp 98 F (36.7 C)   Ht 5' 5"  (1.651 m)   Wt 190 lb 9.6 oz (86.5 kg)   SpO2 96%   BMI 31.72 kg/m   BP Readings from Last 3 Encounters:  12/03/20 110/76  11/16/20 (!) 96/56  10/07/20 (!) 95/52    Wt Readings from Last 3 Encounters:  12/03/20 190 lb 9.6 oz (86.5 kg)  10/07/20 200 lb (90.7 kg)  09/21/20 202 lb 9.6 oz (91.9 kg)     Physical Exam Constitutional:      General: She is not in acute distress.    Appearance: She is well-developed.  Cardiovascular:     Rate and Rhythm: Normal rate and regular rhythm.  Pulmonary:     Breath sounds: Normal breath sounds.  Musculoskeletal:        General: Normal range of motion.  Skin:    General: Skin is warm and dry.  Neurological:     Mental Status: She is alert and oriented to person, place, and time.      Assessment & Plan:   Diagnoses and all orders for this visit:  Acquired hypothyroidism  Multiple sclerosis ([place]) -     CBC with Differential/Platelet -      CMP14+EGFR -     FSH/LH -     TSH + free T4  Other orders -     amitriptyline (ELAVIL) 10 MG tablet; Take 4 at hs for 1 week. Then 3 qhs X 1 week then 2 at hs of one week then one at bedtime for a week. Then DC (Patient taking differently: Take 20 mg by mouth at bedtime. Take 4 at hs for 1 week. Then 3 qhs X 1 week then 2 at hs of one week then one at bedtime for a week. Then DC)      I have discontinued Amber Williams's levonorgestrel-ethinyl estradiol. I am also having her start on amitriptyline. Additionally, I am having her maintain her Vitamin D3, Cyanocobalamin, Allergy Relief, traZODone, aspirin, omeprazole, multivitamin with minerals, traMADol, flurazepam, DuoDERM CGF Dressing, escitalopram, furosemide, nitrofurantoin (macrocrystal-monohydrate), and mirabegron ER.  Allergies as of 12/03/2020       Reactions   Tecfidera [dimethyl Fumarate] Itching        Medication List        Accurate as of December 03, 2020 11:59 PM. If you have any questions, ask your nurse or doctor.          STOP taking these medications    levonorgestrel-ethinyl estradiol 0.15-30 MG-MCG tablet Commonly known as: Altavera Stopped by: [name], MD       TAKE these medications    Allergy Relief 180 MG tablet Generic drug: fexofenadine Take 180 mg by mouth daily as needed for allergies.   amitriptyline 50 MG tablet Commonly known as: ELAVIL TAKE 1 TABLET AT BEDTIME What changed: Another medication with the same name was added. Make sure you understand how and when to take each. Changed by: [name], MD   amitriptyline 10 MG tablet Commonly known as: ELAVIL Take 4 at hs for 1 week. Then 3 qhs X 1 week then 2 at hs of one week then one at bedtime for a week. Then DC What changed: You were already taking a medication with the same name, and this prescription was added. Make sure you understand how and when to take each. Changed by: [name], MD   aspirin 81 MG EC  tablet Take 81 mg by mouth at bedtime.   Cyanocobalamin 5000 MCG Tbdp Take 5,000 mcg by mouth daily.   DuoDERM CGF Dressing Misc Apply every 3 1/2 days.   escitalopram 10 MG tablet Commonly known as: LEXAPRO TAKE 1 TABLET DAILY   flurazepam 30 MG capsule Commonly known as: DALMANE Take 1 capsule (30 mg total) by mouth at bedtime.   furosemide 20 MG tablet Commonly known as: LASIX Take 20 mg by mouth daily as needed for edema.   levothyroxine 125 MCG tablet Commonly known as: SYNTHROID Take 1 tablet (125 mcg total) by mouth daily before breakfast.   mirabegron ER 25 MG Tb24 tablet Commonly known as: MYRBETRIQ Take 25 mg by mouth at bedtime.   multivitamin with minerals tablet  Take 1 tablet by mouth daily. One a day woman   nitrofurantoin (macrocrystal-monohydrate) 100 MG capsule Commonly known as: MACROBID Take 1 capsule (100 mg total) by mouth 2 (two) times daily.   omeprazole 20 MG capsule Commonly known as: PRILOSEC Take 1 capsule (20 mg total) by mouth daily.   traMADol 50 MG tablet Commonly known as: ULTRAM Take 1 tablet (50 mg total) by mouth every 12 (twelve) hours as needed for moderate pain or severe pain.   traZODone 150 MG tablet Commonly known as: DESYREL USE FROM 1/3 TO 1 TABLET ASNEEDED FOR SLEEP What changed:  how much to take how to take this when to take this additional instructions   Vitamin D3 125 MCG (5000 UT) Caps Take 5,000 Units by mouth every morning.         Follow-up: Return in about 6 weeks (around 01/14/2021).  [name], M.D.

## 2020-12-04 ENCOUNTER — Encounter (HOSPITAL_COMMUNITY): Payer: Self-pay | Admitting: Hematology

## 2020-12-04 LAB — CMP14+EGFR
ALT: 15 IU/L (ref 0–32)
AST: 20 IU/L (ref 0–40)
Albumin/Globulin Ratio: 2.4 — ABNORMAL HIGH (ref 1.2–2.2)
Albumin: 3.9 g/dL (ref 3.8–4.8)
Alkaline Phosphatase: 120 IU/L (ref 44–121)
BUN/Creatinine Ratio: 8 — ABNORMAL LOW (ref 9–23)
BUN: 7 mg/dL (ref 6–24)
Bilirubin Total: 0.2 mg/dL (ref 0.0–1.2)
CO2: 21 mmol/L (ref 20–29)
Calcium: 10.7 mg/dL — ABNORMAL HIGH (ref 8.7–10.2)
Chloride: 101 mmol/L (ref 96–106)
Creatinine, Ser: 0.88 mg/dL (ref 0.57–1.00)
Globulin, Total: 1.6 g/dL (ref 1.5–4.5)
Glucose: 70 mg/dL (ref 65–99)
Potassium: 4.3 mmol/L (ref 3.5–5.2)
Sodium: 140 mmol/L (ref 134–144)
Total Protein: 5.5 g/dL — ABNORMAL LOW (ref 6.0–8.5)
eGFR: 83 mL/min/{1.73_m2} (ref >59)

## 2020-12-04 LAB — CBC WITH DIFFERENTIAL/PLATELET
Basophils Absolute: 0 10*3/uL (ref 0.0–0.2)
Basos: 0 %
EOS (ABSOLUTE): 0.1 10*3/uL (ref 0.0–0.4)
Eos: 1 %
Hematocrit: 40.5 % (ref 34.0–46.6)
Hemoglobin: 13.2 g/dL (ref 11.1–15.9)
Immature Grans (Abs): 0.1 10*3/uL (ref 0.0–0.1)
Immature Granulocytes: 1 %
Lymphocytes Absolute: 0.7 10*3/uL (ref 0.7–3.1)
Lymphs: 6 %
MCH: 30.8 pg (ref 26.6–33.0)
MCHC: 32.6 g/dL (ref 31.5–35.7)
MCV: 94 fL (ref 79–97)
Monocytes Absolute: 0.9 10*3/uL (ref 0.1–0.9)
Monocytes: 9 %
Neutrophils Absolute: 9.1 10*3/uL — ABNORMAL HIGH (ref 1.4–7.0)
Neutrophils: 83 %
Platelets: 431 10*3/uL (ref 150–450)
RBC: 4.29 x10E6/uL (ref 3.77–5.28)
RDW: 14.8 % (ref 11.7–15.4)
WBC: 10.9 10*3/uL — ABNORMAL HIGH (ref 3.4–10.8)

## 2020-12-04 LAB — FSH/LH
FSH: 107 m[IU]/mL
LH: 75.8 m[IU]/mL

## 2020-12-04 LAB — TSH+FREE T4
Free T4: 1.89 ng/dL — ABNORMAL HIGH (ref 0.82–1.77)
TSH: 0.092 u[IU]/mL — ABNORMAL LOW (ref 0.450–4.500)

## 2020-12-06 ENCOUNTER — Encounter: Payer: Self-pay | Admitting: Urology

## 2020-12-07 ENCOUNTER — Other Ambulatory Visit: Payer: Self-pay | Admitting: *Deleted

## 2020-12-07 DIAGNOSIS — N319 Neuromuscular dysfunction of bladder, unspecified: Secondary | ICD-10-CM | POA: Diagnosis not present

## 2020-12-07 DIAGNOSIS — N39498 Other specified urinary incontinence: Secondary | ICD-10-CM | POA: Diagnosis not present

## 2020-12-07 DIAGNOSIS — I10 Essential (primary) hypertension: Secondary | ICD-10-CM | POA: Diagnosis not present

## 2020-12-07 DIAGNOSIS — Z9181 History of falling: Secondary | ICD-10-CM | POA: Diagnosis not present

## 2020-12-07 DIAGNOSIS — L89152 Pressure ulcer of sacral region, stage 2: Secondary | ICD-10-CM | POA: Diagnosis not present

## 2020-12-07 DIAGNOSIS — Z87891 Personal history of nicotine dependence: Secondary | ICD-10-CM | POA: Diagnosis not present

## 2020-12-07 DIAGNOSIS — G35 Multiple sclerosis: Secondary | ICD-10-CM | POA: Diagnosis not present

## 2020-12-09 ENCOUNTER — Encounter: Payer: Self-pay | Admitting: Family Medicine

## 2020-12-09 ENCOUNTER — Other Ambulatory Visit: Payer: Self-pay | Admitting: Family Medicine

## 2020-12-09 ENCOUNTER — Telehealth: Payer: Self-pay

## 2020-12-09 MED ORDER — LEVOTHYROXINE SODIUM 112 MCG PO TABS: 112.0000 ug | ORAL_TABLET | Freq: Every day | ORAL | 2 refills | Status: DC

## 2020-12-09 NOTE — Telephone Encounter (Signed)
-----   Message from Dorisann Frames, RN sent at 12/07/2020  4:26 PM EDT ----- Emailed rep for battery interrogation  ----- Message ----- From: Iris Pert, LPN Sent: [id number]   9:59 AM EDT To: Iris Pert, LPN, Dorisann Frames, RN  Just a reminder to check on the interstim battery.

## 2020-12-09 NOTE — Telephone Encounter (Signed)
Reached out to [employer], [place]. Patient still able to communicate with the interstim battery. Rep will meet patient in office on August 16th at 1:30 for battery interrogation.  Patient called and notified and voiced understanding of date/time

## 2020-12-10 DIAGNOSIS — I10 Essential (primary) hypertension: Secondary | ICD-10-CM | POA: Diagnosis not present

## 2020-12-10 DIAGNOSIS — Z87891 Personal history of nicotine dependence: Secondary | ICD-10-CM | POA: Diagnosis not present

## 2020-12-10 DIAGNOSIS — N319 Neuromuscular dysfunction of bladder, unspecified: Secondary | ICD-10-CM | POA: Diagnosis not present

## 2020-12-10 DIAGNOSIS — L89152 Pressure ulcer of sacral region, stage 2: Secondary | ICD-10-CM | POA: Diagnosis not present

## 2020-12-10 DIAGNOSIS — G35 Multiple sclerosis: Secondary | ICD-10-CM | POA: Diagnosis not present

## 2020-12-10 DIAGNOSIS — N39498 Other specified urinary incontinence: Secondary | ICD-10-CM | POA: Diagnosis not present

## 2020-12-10 DIAGNOSIS — Z9181 History of falling: Secondary | ICD-10-CM | POA: Diagnosis not present

## 2020-12-14 ENCOUNTER — Ambulatory Visit: Payer: Medicare HMO

## 2020-12-14 ENCOUNTER — Other Ambulatory Visit: Payer: Self-pay

## 2020-12-14 DIAGNOSIS — N319 Neuromuscular dysfunction of bladder, unspecified: Secondary | ICD-10-CM

## 2020-12-14 NOTE — Progress Notes (Signed)
[employer] Rep, Colletta Maryland, came to office today to interrogate patient Interstim battery. Per Colletta Maryland, battery is low functioning and will need complete replacement in OR for Stage I and II.  Reviewed this with Dr. Diona Fanti who previously saw patient. Posting sheet completed for Dr. Alyson Ingles to replace in OR. Patient aware of surgery date and coordinated with [facility].

## 2020-12-15 DIAGNOSIS — I10 Essential (primary) hypertension: Secondary | ICD-10-CM | POA: Diagnosis not present

## 2020-12-15 DIAGNOSIS — Z9181 History of falling: Secondary | ICD-10-CM | POA: Diagnosis not present

## 2020-12-15 DIAGNOSIS — Z87891 Personal history of nicotine dependence: Secondary | ICD-10-CM | POA: Diagnosis not present

## 2020-12-15 DIAGNOSIS — L89152 Pressure ulcer of sacral region, stage 2: Secondary | ICD-10-CM | POA: Diagnosis not present

## 2020-12-15 DIAGNOSIS — G35 Multiple sclerosis: Secondary | ICD-10-CM | POA: Diagnosis not present

## 2020-12-15 DIAGNOSIS — N319 Neuromuscular dysfunction of bladder, unspecified: Secondary | ICD-10-CM | POA: Diagnosis not present

## 2020-12-15 DIAGNOSIS — N39498 Other specified urinary incontinence: Secondary | ICD-10-CM | POA: Diagnosis not present

## 2020-12-16 ENCOUNTER — Other Ambulatory Visit: Payer: Self-pay | Admitting: Family Medicine

## 2020-12-20 DIAGNOSIS — G35 Multiple sclerosis: Secondary | ICD-10-CM | POA: Diagnosis not present

## 2020-12-21 DIAGNOSIS — G35 Multiple sclerosis: Secondary | ICD-10-CM | POA: Diagnosis not present

## 2020-12-21 DIAGNOSIS — I10 Essential (primary) hypertension: Secondary | ICD-10-CM | POA: Diagnosis not present

## 2020-12-21 DIAGNOSIS — Z87891 Personal history of nicotine dependence: Secondary | ICD-10-CM | POA: Diagnosis not present

## 2020-12-21 DIAGNOSIS — N319 Neuromuscular dysfunction of bladder, unspecified: Secondary | ICD-10-CM | POA: Diagnosis not present

## 2020-12-21 DIAGNOSIS — N39498 Other specified urinary incontinence: Secondary | ICD-10-CM | POA: Diagnosis not present

## 2020-12-21 DIAGNOSIS — L89152 Pressure ulcer of sacral region, stage 2: Secondary | ICD-10-CM | POA: Diagnosis not present

## 2020-12-21 DIAGNOSIS — Z9181 History of falling: Secondary | ICD-10-CM | POA: Diagnosis not present

## 2020-12-22 ENCOUNTER — Ambulatory Visit (INDEPENDENT_AMBULATORY_CARE_PROVIDER_SITE_OTHER): Payer: Medicare HMO | Admitting: Family Medicine

## 2020-12-22 ENCOUNTER — Encounter: Payer: Self-pay | Admitting: Family Medicine

## 2020-12-22 DIAGNOSIS — F5104 Psychophysiologic insomnia: Secondary | ICD-10-CM

## 2020-12-22 DIAGNOSIS — R69 Illness, unspecified: Secondary | ICD-10-CM | POA: Diagnosis not present

## 2020-12-22 MED ORDER — FLURAZEPAM HCL 30 MG PO CAPS: 30.0000 mg | ORAL_CAPSULE | Freq: Every day | ORAL | 0 refills | Status: DC

## 2020-12-22 NOTE — Patient Instructions (Signed)
Amber Williams  12/22/2020     '@PREFPERIOPPHARMACY'$ @   Your procedure is scheduled on  12/27/2020.   Report to [facility] at  [place] A.M.   Call this number if you have problems the morning of surgery:  [phone]   Remember:  Do not eat or drink after midnight.      Take these medicines the morning of surgery with A SIP OF WATER                          lexapro, synthroid, prilosec.     Do not wear jewelry, make-up or nail polish.  Do not wear lotions, powders, or perfumes, or deodorant.  Do not shave 48 hours prior to surgery.  Men may shave face and neck.  Do not bring valuables to the hospital.  [facility] is not responsible for any belongings or valuables.  Contacts, dentures or bridgework may not be worn into surgery.  Leave your suitcase in the car.  After surgery it may be brought to your room.  For patients admitted to the hospital, discharge time will be determined by your treatment team.  Patients discharged the day of surgery will not be allowed to drive home and must have someone with them for 24 hours.    Special instructions:   DO NOT smoke tobacco or vape for 24 hours before your procedure.  Please read over the following fact sheets that you were given. Coughing and Deep Breathing, Surgical Site Infection Prevention, Anesthesia Post-op Instructions, and Care and Recovery After Surgery      General Anesthesia, Adult, Care After This sheet gives you information about how to care for yourself after your procedure. Your health care provider may also give you more specific instructions. If you have problems or questions, contact your health careprovider. What can I expect after the procedure? After the procedure, the following side effects are common: Pain or discomfort at the IV site. Nausea. Vomiting. Sore throat. Trouble concentrating. Feeling cold or chills. Feeling weak or tired. Sleepiness and fatigue. Soreness and body aches. These  side effects can affect parts of the body that were not involved in surgery. Follow these instructions at home: For the time period you were told by your health care provider:  Rest. Do not participate in activities where you could fall or become injured. Do not drive or use machinery. Do not drink alcohol. Do not take sleeping pills or medicines that cause drowsiness. Do not make important decisions or sign legal documents. Do not take care of children on your own.  Eating and drinking Follow any instructions from your health care provider about eating or drinking restrictions. When you feel hungry, start by eating small amounts of foods that are soft and easy to digest (bland), such as toast. Gradually return to your regular diet. Drink enough fluid to keep your urine pale yellow. If you vomit, rehydrate by drinking water, juice, or clear broth. General instructions If you have sleep apnea, surgery and certain medicines can increase your risk for breathing problems. Follow instructions from your health care provider about wearing your sleep device: Anytime you are sleeping, including during daytime naps. While taking prescription pain medicines, sleeping medicines, or medicines that make you drowsy. Have a responsible adult stay with you for the time you are told. It is important to have someone help care for you until you are awake and alert. Return to your  normal activities as told by your health care provider. Ask your health care provider what activities are safe for you. Take over-the-counter and prescription medicines only as told by your health care provider. If you smoke, do not smoke without supervision. Keep all follow-up visits as told by your health care provider. This is important. Contact a health care provider if: You have nausea or vomiting that does not get better with medicine. You cannot eat or drink without vomiting. You have pain that does not get better with  medicine. You are unable to pass urine. You develop a skin rash. You have a fever. You have redness around your IV site that gets worse. Get help right away if: You have difficulty breathing. You have chest pain. You have blood in your urine or stool, or you vomit blood. Summary After the procedure, it is common to have a sore throat or nausea. It is also common to feel tired. Have a responsible adult stay with you for the time you are told. It is important to have someone help care for you until you are awake and alert. When you feel hungry, start by eating small amounts of foods that are soft and easy to digest (bland), such as toast. Gradually return to your regular diet. Drink enough fluid to keep your urine pale yellow. Return to your normal activities as told by your health care provider. Ask your health care provider what activities are safe for you. This information is not intended to replace advice given to you by your health care provider. Make sure you discuss any questions you have with your healthcare provider. Document Revised: 01/01/2020 Document Reviewed: 07/31/2019 Elsevier Patient Education  2022 [place]. How to Use Chlorhexidine for Bathing Chlorhexidine gluconate (CHG) is a germ-killing (antiseptic) solution that is used to clean the skin. It can get rid of the bacteria that normally live on the skin and can keep them away for about 24 hours. To clean your skin with CHG, you may be given: A CHG solution to use in the shower or as part of a sponge bath. A prepackaged cloth that contains CHG. Cleaning your skin with CHG may help lower the risk for infection: While you are staying in the intensive care unit of the hospital. If you have a vascular access, such as a central line, to provide short-term or long-term access to your veins. If you have a catheter to drain urine from your bladder. If you are on a ventilator. A ventilator is a machine that helps you breathe by  moving air in and out of your lungs. After surgery. What are the risks? Risks of using CHG include: A skin reaction. Hearing loss, if CHG gets in your ears. Eye injury, if CHG gets in your eyes and is not rinsed out. The CHG product catching fire. Make sure that you avoid smoking and flames after applying CHG to your skin. Do not use CHG: If you have a chlorhexidine allergy or have previously reacted to chlorhexidine. On babies younger than 34 months of age. How to use CHG solution Use CHG only as told by your health care provider, and follow the instructions on the label. Use the full amount of CHG as directed. Usually, this is one bottle. During a shower Follow these steps when using CHG solution during a shower (unless your health care provider gives you different instructions): Start the shower. Use your normal soap and shampoo to wash your face and hair. Turn off the shower  or move out of the shower stream. Pour the CHG onto a clean washcloth. Do not use any type of brush or rough-edged sponge. Starting at your neck, lather your body down to your toes. Make sure you follow these instructions: If you will be having surgery, pay special attention to the part of your body where you will be having surgery. Scrub this area for at least 1 minute. Do not use CHG on your head or face. If the solution gets into your ears or eyes, rinse them well with water. Avoid your genital area. Avoid any areas of skin that have broken skin, cuts, or scrapes. Scrub your back and under your arms. Make sure to wash skin folds. Let the lather sit on your skin for 1-2 minutes or as long as told by your health care provider. Thoroughly rinse your entire body in the shower. Make sure that all body creases and crevices are rinsed well. Dry off with a clean towel. Do not put any substances on your body afterward--such as powder, lotion, or perfume--unless you are told to do so by your health care provider. Only  use lotions that are recommended by the manufacturer. Put on clean clothes or pajamas. If it is the night before your surgery, sleep in clean sheets.  During a sponge bath Follow these steps when using CHG solution during a sponge bath (unless your health care provider gives you different instructions): Use your normal soap and shampoo to wash your face and hair. Pour the CHG onto a clean washcloth. Starting at your neck, lather your body down to your toes. Make sure you follow these instructions: If you will be having surgery, pay special attention to the part of your body where you will be having surgery. Scrub this area for at least 1 minute. Do not use CHG on your head or face. If the solution gets into your ears or eyes, rinse them well with water. Avoid your genital area. Avoid any areas of skin that have broken skin, cuts, or scrapes. Scrub your back and under your arms. Make sure to wash skin folds. Let the lather sit on your skin for 1-2 minutes or as long as told by your health care provider. Using a different clean, wet washcloth, thoroughly rinse your entire body. Make sure that all body creases and crevices are rinsed well. Dry off with a clean towel. Do not put any substances on your body afterward--such as powder, lotion, or perfume--unless you are told to do so by your health care provider. Only use lotions that are recommended by the manufacturer. Put on clean clothes or pajamas. If it is the night before your surgery, sleep in clean sheets. How to use CHG prepackaged cloths Only use CHG cloths as told by your health care provider, and follow the instructions on the label. Use the CHG cloth on clean, dry skin. Do not use the CHG cloth on your head or face unless your health care provider tells you to. When washing with the CHG cloth: Avoid your genital area. Avoid any areas of skin that have broken skin, cuts, or scrapes. Before surgery Follow these steps when using a CHG  cloth to clean before surgery (unless your health care provider gives you different instructions): Using the CHG cloth, vigorously scrub the part of your body where you will be having surgery. Scrub using a back-and-forth motion for 3 minutes. The area on your body should be completely wet with CHG when you are done scrubbing.  Do not rinse. Discard the cloth and let the area air-dry. Do not put any substances on the area afterward, such as powder, lotion, or perfume. Put on clean clothes or pajamas. If it is the night before your surgery, sleep in clean sheets.  For general bathing Follow these steps when using CHG cloths for general bathing (unless your health care provider gives you different instructions). Use a separate CHG cloth for each area of your body. Make sure you wash between any folds of skin and between your fingers and toes. Wash your body in the following order, switching to a new cloth after each step: The front of your neck, shoulders, and chest. Both of your arms, under your arms, and your hands. Your stomach and groin area, avoiding the genitals. Your right leg and foot. Your left leg and foot. The back of your neck, your back, and your buttocks. Do not rinse. Discard the cloth and let the area air-dry. Do not put any substances on your body afterward--such as powder, lotion, or perfume--unless you are told to do so by your health care provider. Only use lotions that are recommended by the manufacturer. Put on clean clothes or pajamas. Contact a health care provider if: Your skin gets irritated after scrubbing. You have questions about using your solution or cloth. Get help right away if: Your eyes become very red or swollen. Your eyes itch badly. Your skin itches badly and is red or swollen. Your hearing changes. You have trouble seeing. You have swelling or tingling in your mouth or throat. You have trouble breathing. You swallow any  chlorhexidine. Summary Chlorhexidine gluconate (CHG) is a germ-killing (antiseptic) solution that is used to clean the skin. Cleaning your skin with CHG may help to lower your risk for infection. You may be given CHG to use for bathing. It may be in a bottle or in a prepackaged cloth to use on your skin. Carefully follow your health care provider's instructions and the instructions on the product label. Do not use CHG if you have a chlorhexidine allergy. Contact your health care provider if your skin gets irritated after scrubbing. This information is not intended to replace advice given to you by your health care provider. Make sure you discuss any questions you have with your healthcare provider. Document Revised: 08/29/2019 Document Reviewed: 10/03/2019 Elsevier Patient Education  [place].

## 2020-12-22 NOTE — Progress Notes (Signed)
   Virtual Visit  Note Due to COVID-19 pandemic this visit was conducted virtually. This visit type was conducted due to national recommendations for restrictions regarding the COVID-19 Pandemic (e.g. social distancing, sheltering in place) in an effort to limit this patient's exposure and mitigate transmission in our community. All issues noted in this document were discussed and addressed.  A physical exam was not performed with this format.  I connected with Amber Williams on 12/22/20 at 1555 by telephone and verified that I am speaking with the correct person using two identifiers. Amber Williams is currently located at home and no one is currently with currently with her during the visit. The provider, Gwenlyn Perking, FNP is located in their office at time of visit.  I discussed the limitations, risks, security and privacy concerns of performing an evaluation and management service by telephone and the availability of in person appointments. I also discussed with the patient that there may be a patient responsible charge related to this service. The patient expressed understanding and agreed to proceed.  CC: medication refill  History and Present Illness:  HPI Amber Williams needs a refill on her flurazepam today. She takes this for insomnia. This is prescribed by her PCP Dr. Livia Snellen. She was seen for a chronic follow up with Dr. Livia Snellen about 2 weeks ago. She reports that further refills had not been sent in at that visit. It does appear that she is not up to date with her CSA.    ROS As per HPI.   Observations/Objective: Alert and oriented x 3. Able to speak in full sentences without difficulty.    Assessment and Plan: Amber Williams was seen today for insomnia and medication refill.  Diagnoses and all orders for this visit:  Chronic insomnia PDMP reviewed, no red flags. Discussed with patient that I will send in a 30 day supply for her. She has a scheduled follow up with her PCP on 01/17/21.  Discussed with patient that she will need to keep this appt for further refills. Discussed that CSA will likely need to be signed with PCP at her next visit as well.  -     flurazepam (DALMANE) 30 MG capsule; Take 1 capsule (30 mg total) by mouth at bedtime.    Follow Up Instructions: Keep scheduled follow up appointment.     I discussed the assessment and treatment plan with the patient. The patient was provided an opportunity to ask questions and all were answered. The patient agreed with the plan and demonstrated an understanding of the instructions.   The patient was advised to call back or seek an in-person evaluation if the symptoms worsen or if the condition fails to improve as anticipated.  The above assessment and management plan was discussed with the patient. The patient verbalized understanding of and has agreed to the management plan. Patient is aware to call the clinic if symptoms persist or worsen. Patient is aware when to return to the clinic for a follow-up visit. Patient educated on when it is appropriate to go to the emergency department.   Time call ended:  1607  I provided 12 minutes of  non face-to-face time during this encounter.    Gwenlyn Perking, FNP

## 2020-12-23 ENCOUNTER — Encounter (HOSPITAL_COMMUNITY)
Admission: RE | Admit: 2020-12-23 | Discharge: 2020-12-23 | Disposition: A | Discharge status: Home / Self Care | Payer: Medicare HMO | Source: Ambulatory Visit | Attending: Urology | Admitting: Urology

## 2020-12-23 ENCOUNTER — Other Ambulatory Visit: Payer: Self-pay

## 2020-12-23 DIAGNOSIS — Z01812 Encounter for preprocedural laboratory examination: Secondary | ICD-10-CM | POA: Diagnosis not present

## 2020-12-23 LAB — PREGNANCY, URINE: Preg Test, Ur: NEGATIVE

## 2020-12-24 DIAGNOSIS — G35 Multiple sclerosis: Secondary | ICD-10-CM | POA: Diagnosis not present

## 2020-12-24 DIAGNOSIS — M21371 Foot drop, right foot: Secondary | ICD-10-CM | POA: Diagnosis not present

## 2020-12-24 DIAGNOSIS — R5383 Other fatigue: Secondary | ICD-10-CM | POA: Diagnosis not present

## 2020-12-24 DIAGNOSIS — G5731 Lesion of lateral popliteal nerve, right lower limb: Secondary | ICD-10-CM | POA: Diagnosis not present

## 2020-12-26 ENCOUNTER — Encounter: Payer: Self-pay | Admitting: Family Medicine

## 2020-12-27 ENCOUNTER — Encounter (HOSPITAL_COMMUNITY): Payer: Self-pay | Admitting: Urology

## 2020-12-27 ENCOUNTER — Ambulatory Visit (HOSPITAL_COMMUNITY): Payer: Medicare HMO

## 2020-12-27 ENCOUNTER — Ambulatory Visit (HOSPITAL_COMMUNITY): Payer: Medicare HMO | Admitting: Anesthesiology

## 2020-12-27 ENCOUNTER — Encounter (HOSPITAL_COMMUNITY)
Admission: RE | Disposition: A | Discharge status: Home / Self Care | Payer: Self-pay | Source: Ambulatory Visit | Attending: Urology

## 2020-12-27 ENCOUNTER — Ambulatory Visit (HOSPITAL_COMMUNITY)
Admission: RE | Admit: 2020-12-27 | Discharge: 2020-12-27 | Disposition: A | Discharge status: Home / Self Care | Payer: Medicare HMO | Source: Ambulatory Visit | Attending: Urology | Admitting: Urology

## 2020-12-27 ENCOUNTER — Other Ambulatory Visit: Payer: Self-pay

## 2020-12-27 DIAGNOSIS — Z87891 Personal history of nicotine dependence: Secondary | ICD-10-CM | POA: Diagnosis not present

## 2020-12-27 DIAGNOSIS — N319 Neuromuscular dysfunction of bladder, unspecified: Secondary | ICD-10-CM | POA: Insufficient documentation

## 2020-12-27 DIAGNOSIS — G35 Multiple sclerosis: Secondary | ICD-10-CM | POA: Insufficient documentation

## 2020-12-27 DIAGNOSIS — Y831 Surgical operation with implant of artificial internal device as the cause of abnormal reaction of the patient, or of later complication, without mention of misadventure at the time of the procedure: Secondary | ICD-10-CM | POA: Diagnosis not present

## 2020-12-27 DIAGNOSIS — I10 Essential (primary) hypertension: Secondary | ICD-10-CM | POA: Insufficient documentation

## 2020-12-27 DIAGNOSIS — R32 Unspecified urinary incontinence: Secondary | ICD-10-CM | POA: Diagnosis present

## 2020-12-27 DIAGNOSIS — Z888 Allergy status to other drugs, medicaments and biological substances status: Secondary | ICD-10-CM | POA: Diagnosis not present

## 2020-12-27 DIAGNOSIS — Z4542 Encounter for adjustment and management of neuropacemaker (brain) (peripheral nerve) (spinal cord): Secondary | ICD-10-CM | POA: Diagnosis not present

## 2020-12-27 DIAGNOSIS — K219 Gastro-esophageal reflux disease without esophagitis: Secondary | ICD-10-CM | POA: Insufficient documentation

## 2020-12-27 DIAGNOSIS — T83110A Breakdown (mechanical) of urinary electronic stimulator device, initial encounter: Secondary | ICD-10-CM | POA: Diagnosis not present

## 2020-12-27 HISTORY — PX: INTERSTIM IMPLANT PLACEMENT: SHX5130

## 2020-12-27 SURGERY — INSERTION, SACRAL NERVE STIMULATOR, INTERSTIM, STAGE 1
Anesthesia: General | Site: Buttocks

## 2020-12-27 MED ORDER — ONDANSETRON HCL 4 MG/2ML IJ SOLN: 4.0000 mg | Freq: Once | INTRAMUSCULAR | Status: DC | PRN

## 2020-12-27 MED ORDER — OXYCODONE-ACETAMINOPHEN 5-325 MG PO TABS: 1.0000 | ORAL_TABLET | ORAL | 0 refills | Status: DC | PRN

## 2020-12-27 MED ORDER — MIDAZOLAM HCL 5 MG/5ML IJ SOLN
INTRAMUSCULAR | Status: DC | PRN
  Administered 2020-12-27: 2 mg via INTRAVENOUS

## 2020-12-27 MED ORDER — CEFAZOLIN SODIUM-DEXTROSE 2-4 GM/100ML-% IV SOLN
INTRAVENOUS | Status: AC
  Filled 2020-12-27: qty 100

## 2020-12-27 MED ORDER — LACTATED RINGERS IV SOLN: INTRAVENOUS | Status: DC

## 2020-12-27 MED ORDER — PHENYLEPHRINE HCL (PRESSORS) 10 MG/ML IV SOLN
INTRAVENOUS | Status: DC | PRN
  Administered 2020-12-27 (×2): 80 ug via INTRAVENOUS

## 2020-12-27 MED ORDER — FENTANYL CITRATE PF 50 MCG/ML IJ SOSY: 25.0000 ug | PREFILLED_SYRINGE | INTRAMUSCULAR | Status: DC | PRN

## 2020-12-27 MED ORDER — FENTANYL CITRATE (PF) 100 MCG/2ML IJ SOLN
INTRAMUSCULAR | Status: DC | PRN
  Administered 2020-12-27: 50 ug via INTRAVENOUS

## 2020-12-27 MED ORDER — PROPOFOL 10 MG/ML IV BOLUS
INTRAVENOUS | Status: DC | PRN
  Administered 2020-12-27 (×2): 30 mg via INTRAVENOUS

## 2020-12-27 MED ORDER — MEPERIDINE HCL 50 MG/ML IJ SOLN: 6.2500 mg | INTRAMUSCULAR | Status: DC | PRN

## 2020-12-27 MED ORDER — PHENYLEPHRINE 40 MCG/ML (10ML) SYRINGE FOR IV PUSH (FOR BLOOD PRESSURE SUPPORT)
PREFILLED_SYRINGE | INTRAVENOUS | Status: AC
  Filled 2020-12-27: qty 10

## 2020-12-27 MED ORDER — MIDAZOLAM HCL 2 MG/2ML IJ SOLN
INTRAMUSCULAR | Status: AC
  Filled 2020-12-27: qty 2

## 2020-12-27 MED ORDER — PROPOFOL 10 MG/ML IV BOLUS
INTRAVENOUS | Status: AC
  Filled 2020-12-27: qty 40

## 2020-12-27 MED ORDER — SULFAMETHOXAZOLE-TRIMETHOPRIM 800-160 MG PO TABS: 1.0000 | ORAL_TABLET | Freq: Two times a day (BID) | ORAL | 0 refills | Status: DC

## 2020-12-27 MED ORDER — ONDANSETRON HCL 4 MG/2ML IJ SOLN
INTRAMUSCULAR | Status: AC
  Filled 2020-12-27: qty 4

## 2020-12-27 MED ORDER — ORAL CARE MOUTH RINSE: 15.0000 mL | Freq: Once | OROMUCOSAL | Status: AC

## 2020-12-27 MED ORDER — PROPOFOL 10 MG/ML IV BOLUS
INTRAVENOUS | Status: AC
  Filled 2020-12-27: qty 20

## 2020-12-27 MED ORDER — LIDOCAINE-EPINEPHRINE (PF) 1 %-1:200000 IJ SOLN
INTRAMUSCULAR | Status: DC | PRN
  Administered 2020-12-27: 24 mL

## 2020-12-27 MED ORDER — PROPOFOL 500 MG/50ML IV EMUL
INTRAVENOUS | Status: DC | PRN
  Administered 2020-12-27: 150 ug/kg/min via INTRAVENOUS

## 2020-12-27 MED ORDER — ONDANSETRON HCL 4 MG/2ML IJ SOLN
INTRAMUSCULAR | Status: DC | PRN
  Administered 2020-12-27: 4 mg via INTRAVENOUS

## 2020-12-27 MED ORDER — CEFAZOLIN SODIUM-DEXTROSE 2-4 GM/100ML-% IV SOLN
2.0000 g | INTRAVENOUS | Status: AC
  Administered 2020-12-27: 2 g via INTRAVENOUS

## 2020-12-27 MED ORDER — CHLORHEXIDINE GLUCONATE 0.12 % MT SOLN
15.0000 mL | Freq: Once | OROMUCOSAL | Status: AC
  Administered 2020-12-27: 15 mL via OROMUCOSAL

## 2020-12-27 MED ORDER — FENTANYL CITRATE (PF) 100 MCG/2ML IJ SOLN
INTRAMUSCULAR | Status: AC
  Filled 2020-12-27: qty 2

## 2020-12-27 MED ORDER — CHLORHEXIDINE GLUCONATE 0.12 % MT SOLN
OROMUCOSAL | Status: AC
  Filled 2020-12-27: qty 15

## 2020-12-27 MED ORDER — DEXAMETHASONE SODIUM PHOSPHATE 10 MG/ML IJ SOLN
INTRAMUSCULAR | Status: DC | PRN
  Administered 2020-12-27: 8 mg via INTRAVENOUS

## 2020-12-27 MED ORDER — DEXAMETHASONE SODIUM PHOSPHATE 10 MG/ML IJ SOLN
INTRAMUSCULAR | Status: AC
  Filled 2020-12-27: qty 2

## 2020-12-27 MED ORDER — LIDOCAINE-EPINEPHRINE (PF) 1 %-1:200000 IJ SOLN
30.0000 mL | Freq: Once | INTRAMUSCULAR | Status: DC
  Filled 2020-12-27: qty 30

## 2020-12-27 MED ORDER — WATER FOR IRRIGATION, STERILE IR SOLN
Status: DC | PRN
  Administered 2020-12-27: 1000 mL

## 2020-12-27 SURGICAL SUPPLY — 48 items
ADH SKN CLS APL DERMABOND .7 (GAUZE/BANDAGES/DRESSINGS) ×1
ANTNA NRSTM XTRN TELEM NS LF (UROLOGICAL SUPPLIES)
APL PRP STRL LF DISP 70% ISPRP (MISCELLANEOUS) ×1
CHLORAPREP W/TINT 26 (MISCELLANEOUS) ×2 IMPLANT
COVER LIGHT HANDLE STERIS (MISCELLANEOUS) ×4 IMPLANT
COVER PROBE U/S 5X48 (MISCELLANEOUS) ×2 IMPLANT
DECANTER SPIKE VIAL GLASS SM (MISCELLANEOUS) ×2 IMPLANT
DERMABOND ADVANCED (GAUZE/BANDAGES/DRESSINGS) ×1
DERMABOND ADVANCED .7 DNX12 (GAUZE/BANDAGES/DRESSINGS) ×1 IMPLANT
DRAPE C-ARM FOLDED MOBILE STRL (DRAPES) ×2 IMPLANT
DRAPE HALF SHEET 40X57 (DRAPES) ×2 IMPLANT
DRAPE INCISE IOBAN 66X45 STRL (DRAPES) ×2 IMPLANT
DRAPE LAPAROSCOPIC ABDOMINAL (DRAPES) ×2 IMPLANT
DRSG TEGADERM 2-3/8X2-3/4 SM (GAUZE/BANDAGES/DRESSINGS) ×2 IMPLANT
DRSG TEGADERM 4X4.75 (GAUZE/BANDAGES/DRESSINGS) ×2 IMPLANT
DRSG TELFA 3X8 NADH (GAUZE/BANDAGES/DRESSINGS) IMPLANT
ELECT REM PT RETURN 9FT ADLT (ELECTROSURGICAL) ×2
ELECTRODE REM PT RTRN 9FT ADLT (ELECTROSURGICAL) ×1 IMPLANT
GAUZE 4X4 16PLY ~~LOC~~+RFID DBL (SPONGE) ×2 IMPLANT
GLOVE SURG POLYISO LF SZ8 (GLOVE) ×2 IMPLANT
GLOVE SURG UNDER POLY LF SZ7 (GLOVE) ×4 IMPLANT
GOWN STRL REUS W/TWL LRG LVL3 (GOWN DISPOSABLE) ×2 IMPLANT
GOWN STRL REUS W/TWL XL LVL3 (GOWN DISPOSABLE) ×2 IMPLANT
INTRODUCER GUIDE DILATR SHEATH (SET/KITS/TRAYS/PACK) ×2 IMPLANT
KIT HANDSET INTERSTIM COMM (NEUROSURGERY SUPPLIES) ×2 IMPLANT
KIT TURNOVER CYSTO (KITS) ×2 IMPLANT
LEAD INTERSTIM 4.32 28 L (Lead) ×2 IMPLANT
MANIFOLD NEPTUNE II (INSTRUMENTS) ×2 IMPLANT
NEEDLE HYPO 21X1.5 SAFETY (NEEDLE) ×2 IMPLANT
NEUROSTIMULATOR 1.7X2X.06 (UROLOGICAL SUPPLIES) ×2 IMPLANT
PACK MINOR (CUSTOM PROCEDURE TRAY) ×2 IMPLANT
PAD ARMBOARD 7.5X6 YLW CONV (MISCELLANEOUS) ×2 IMPLANT
PROGRAMMER ANTENNA EXT (UROLOGICAL SUPPLIES) IMPLANT
PROGRAMMER STIMUL 2.2X1.1X3.7 (UROLOGICAL SUPPLIES) IMPLANT
SET BASIN LINEN APH (SET/KITS/TRAYS/PACK) ×2 IMPLANT
SPONGE GAUZE 2X2 8PLY STRL LF (GAUZE/BANDAGES/DRESSINGS) ×4 IMPLANT
STIMULATOR INTERSTIM 2X1.7X.3 (Miscellaneous) ×2 IMPLANT
STRIP CLOSURE SKIN 1/2X4 (GAUZE/BANDAGES/DRESSINGS) IMPLANT
SUT MNCRL AB 4-0 PS2 18 (SUTURE) ×2 IMPLANT
SUT SILK 2 0 (SUTURE) ×2
SUT SILK 2-0 18XBRD TIE 12 (SUTURE) ×1 IMPLANT
SUT SILK 3 0 (SUTURE) ×2
SUT SILK 3-0 FS1 18XBRD (SUTURE) ×1 IMPLANT
SUT VIC AB 3-0 SH 27 (SUTURE) ×4
SUT VIC AB 3-0 SH 27X BRD (SUTURE) ×2 IMPLANT
SYR BULB IRRIG 60ML STRL (SYRINGE) ×2 IMPLANT
SYR CONTROL 10ML LL (SYRINGE) ×2 IMPLANT
WATER STERILE IRR 500ML POUR (IV SOLUTION) ×2 IMPLANT

## 2020-12-27 NOTE — Anesthesia Postprocedure Evaluation (Signed)
Anesthesia Post Note  Patient: Amber Williams  Procedure(s) Performed: Barrie Lyme IMPLANT REVISION - STAGE 1 AND 2 BATTERY SIGNAL LOW NONE FUNCTIONING.  BATTERY AND LEADS TO BE REPLACED.  Patient location during evaluation: PACU Anesthesia Type: General Level of consciousness: awake and alert and oriented Pain management: pain level controlled Vital Signs Assessment: post-procedure vital signs reviewed and stable Respiratory status: spontaneous breathing and respiratory function stable Cardiovascular status: blood pressure returned to baseline and stable Postop Assessment: no apparent nausea or vomiting Anesthetic complications: no   No notable events documented.   Last Vitals:  Vitals:   12/27/20 1130 12/27/20 1140  BP: (!) 101/56 (!) 123/54  Pulse: 95 94  Resp: 11 16  Temp:  (!) 36.4 C  SpO2: 98% 100%    Last Pain:  Vitals:   12/27/20 1140  TempSrc: Oral  PainSc: 0-No pain                 Lilly Gasser C Vondell Babers

## 2020-12-27 NOTE — Op Note (Signed)
Preoperative diagnosis: Neurogenic bladder  Postoperative diagnosis: Same  Procedure: 1. Removal of Interstim lead and battery 2. Placement of InterStim stage I and Stage II and impedance check  Surgeon: Dr. Nicolette Bang  Assistant: None  Antibiotics: Ancef  Drains: None  Indications: The patient is a 45yo with a hx of neurogenic bladder who currently has an interstim in place which is not functioning. After discussing treatment options she has elected to proceed with Interstim Stage 1 and Stage 2 implantation  Procedure in detail: Prior to the procedure consent was obtained. The patient was brought to the operating room and a brief time out was completed to ensure correct patient, correct procedure and correct site. Preoperative antibiotics were given. Extra care was taken positioning the patient in a prone position. Usual skin preparation was utilized.   A 4 cm right upper buttock incision was made over the previus battery. 10 mL of a lidocaine epinephrine mixture was utilized prior to incision. It was carried down to appropriate depth of the battery. The battery was then brought into the operative field. We then marked the lead insertion into the sacrum with fluoroscopy. We then injected 5cc of lidocaine over the site and then made a 1.5cm incision. We dissected down to the lead. Using gentle traction the lead was removed intact. We then cut the lead and removed both the lead and the battery.   Using lateral and AP fluoroscopy I marked S3. After several minutes of testing I was in the S4 foramina with a bellows response and the foramina was below the bone knuckle noted on x-ray. Eventually I was in S3 on the patient's left side above the knuckle.  Approximate 12 mL of a lidocaine epinephrine mixture was utilized in the midline. Bony table was anesthetized. The 5 inch foramen needle was introduced into the S3 foramina as noted above. At very low setting she had an excellent bellows and  toe response  The inner aspect of the foramen needle was removed and the guide was placed to the appropriate depth removing the framing needle. Under fluoroscopic guidance after making a 1 cm incision the white trocar was passed to the appropriate depth. The lead with a hockey stick bend was placed to the appropriate depth just bridging the bone medially. She had excellent bellows and toe response at all 4 settings. Under fluoroscopic guidance the inner aspect of the lead was removed. X-rays were taken.  With the passer I passed the lead from medial to lateral to the previous battery pocket. We then passed the lead across the midline and to the right upper buttock. The lead was then brought through this incision.  We then attached the generator and battery to the lead and tightened the screws. We then placed the generator in the previous pocket in the right upper buttock. We closed the deep subcutaneous tissue with 2-0 vicryl in a running fashion. The incisions were then closed with running 4-0 monocryl.  Impedance check was done utilizing sterile technique and was normal in all 4 positions We then placed dermabond on the incisions and this concluded the procedure which was well tolerated by the patient.  Complications: none  Condition: stable, transferred to PACU  Plan: The patient is to be discharged home after she voids in the PACU. If she cannot void the foley will be replaced and she will followup in 2-3 days for a second voiding trial. If she is able to void she will keep a voiding diary and will be  reassessed in 1 week for response.

## 2020-12-27 NOTE — Anesthesia Preprocedure Evaluation (Signed)
Anesthesia Evaluation  Patient identified by MRN, date of birth, ID band Patient awake    Reviewed: Allergy & Precautions, NPO status , Patient's Chart, lab work & pertinent test results  History of Anesthesia Complications Negative for: history of anesthetic complications  Airway Mallampati: II  TM Distance: >3 FB Neck ROM: Full    Dental  (+) Dental Advisory Given, Chipped   Pulmonary former smoker,    Pulmonary exam normal breath sounds clear to auscultation       Cardiovascular Exercise Tolerance: Poor hypertension, Pt. on medications Normal cardiovascular exam Rhythm:Regular Rate:Normal     Neuro/Psych PSYCHIATRIC DISORDERS Anxiety Depression PTSD  Neuromuscular disease (multiple sclerosis )    GI/Hepatic Neg liver ROS, GERD  Medicated and Controlled,Anal cancer Gastric cancer   Endo/Other  Hypothyroidism   Renal/GU negative Renal ROS     Musculoskeletal  (+) Arthritis ,   Abdominal   Peds  Hematology negative hematology ROS (+)   Anesthesia Other Findings Multiple sclerosis getting worse.  Reproductive/Obstetrics negative OB ROS                            Anesthesia Physical Anesthesia Plan  ASA: 3  Anesthesia Plan: General   Post-op Pain Management:    Induction: Intravenous  PONV Risk Score and Plan: Propofol infusion  Airway Management Planned: Nasal Cannula, Natural Airway and Simple Face Mask  Additional Equipment:   Intra-op Plan:   Post-operative Plan:   Informed Consent: I have reviewed the patients History and Physical, chart, labs and discussed the procedure including the risks, benefits and alternatives for the proposed anesthesia with the patient or authorized representative who has indicated his/her understanding and acceptance.     Dental advisory given  Plan Discussed with: CRNA and Surgeon  Anesthesia Plan Comments:        Anesthesia  Quick Evaluation

## 2020-12-27 NOTE — Transfer of Care (Signed)
Immediate Anesthesia Transfer of Care Note  Patient: Amber Williams  Procedure(s) Performed: Barrie Lyme IMPLANT REVISION - STAGE 1 AND 2 BATTERY SIGNAL LOW NONE FUNCTIONING.  BATTERY AND LEADS TO BE REPLACED.  Patient Location: PACU  Anesthesia Type:General  Level of Consciousness: awake  Airway & Oxygen Therapy: Patient Spontanous Breathing  Post-op Assessment: Report given to RN  Post vital signs: Reviewed and stable  Last Vitals:  Vitals Value Taken Time  BP 114/48 12/27/20 1100  Temp    Pulse 99 12/27/20 1101  Resp 22 12/27/20 1101  SpO2 98 % 12/27/20 1101  Vitals shown include unvalidated device data.  Last Pain:  Vitals:   12/27/20 0805  TempSrc: Oral  PainSc: 0-No pain      Patients Stated Pain Goal: 8 ([id number]  Complications: No notable events documented.
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
## 2020-12-29 ENCOUNTER — Other Ambulatory Visit: Payer: Self-pay | Admitting: Family Medicine

## 2020-12-29 ENCOUNTER — Encounter (HOSPITAL_COMMUNITY): Payer: Self-pay | Admitting: Urology

## 2020-12-29 DIAGNOSIS — G35 Multiple sclerosis: Secondary | ICD-10-CM

## 2020-12-30 ENCOUNTER — Ambulatory Visit (INDEPENDENT_AMBULATORY_CARE_PROVIDER_SITE_OTHER): Payer: Medicare HMO

## 2020-12-30 ENCOUNTER — Other Ambulatory Visit: Payer: Self-pay

## 2020-12-30 DIAGNOSIS — L89152 Pressure ulcer of sacral region, stage 2: Secondary | ICD-10-CM

## 2020-12-30 DIAGNOSIS — N39498 Other specified urinary incontinence: Secondary | ICD-10-CM | POA: Diagnosis not present

## 2020-12-30 DIAGNOSIS — Z87891 Personal history of nicotine dependence: Secondary | ICD-10-CM | POA: Diagnosis not present

## 2020-12-30 DIAGNOSIS — Z9181 History of falling: Secondary | ICD-10-CM

## 2020-12-30 DIAGNOSIS — G35 Multiple sclerosis: Secondary | ICD-10-CM | POA: Diagnosis not present

## 2020-12-30 DIAGNOSIS — I10 Essential (primary) hypertension: Secondary | ICD-10-CM

## 2020-12-30 DIAGNOSIS — N319 Neuromuscular dysfunction of bladder, unspecified: Secondary | ICD-10-CM | POA: Diagnosis not present

## 2021-01-04 ENCOUNTER — Other Ambulatory Visit: Payer: Self-pay | Admitting: Family Medicine

## 2021-01-04 DIAGNOSIS — Z1231 Encounter for screening mammogram for malignant neoplasm of breast: Secondary | ICD-10-CM

## 2021-01-06 ENCOUNTER — Ambulatory Visit: Payer: Medicare HMO

## 2021-01-11 DIAGNOSIS — R350 Frequency of micturition: Secondary | ICD-10-CM | POA: Diagnosis not present

## 2021-01-11 DIAGNOSIS — N390 Urinary tract infection, site not specified: Secondary | ICD-10-CM | POA: Diagnosis not present

## 2021-01-12 DIAGNOSIS — N390 Urinary tract infection, site not specified: Secondary | ICD-10-CM | POA: Diagnosis not present

## 2021-01-17 ENCOUNTER — Ambulatory Visit (INDEPENDENT_AMBULATORY_CARE_PROVIDER_SITE_OTHER): Payer: Medicare HMO | Admitting: Family Medicine

## 2021-01-17 ENCOUNTER — Encounter: Payer: Self-pay | Admitting: Family Medicine

## 2021-01-17 ENCOUNTER — Other Ambulatory Visit: Payer: Self-pay

## 2021-01-17 VITALS — BP 110/76 | HR 89 | Temp 98.1°F | Ht 65.0 in | Wt 189.2 lb

## 2021-01-17 DIAGNOSIS — F4310 Post-traumatic stress disorder, unspecified: Secondary | ICD-10-CM | POA: Diagnosis not present

## 2021-01-17 DIAGNOSIS — Z23 Encounter for immunization: Secondary | ICD-10-CM | POA: Diagnosis not present

## 2021-01-17 DIAGNOSIS — R69 Illness, unspecified: Secondary | ICD-10-CM | POA: Diagnosis not present

## 2021-01-17 DIAGNOSIS — E039 Hypothyroidism, unspecified: Secondary | ICD-10-CM | POA: Diagnosis not present

## 2021-01-17 MED ORDER — QUETIAPINE FUMARATE 25 MG PO TABS: 25.0000 mg | ORAL_TABLET | Freq: Every day | ORAL | 1 refills | Status: DC

## 2021-01-17 NOTE — Progress Notes (Signed)
Subjective:  Patient ID: Amber Williams, female    DOB: May 24, 1975  Age: 45 y.o. MRN: [id number]  CC: Follow-up   HPI Amber Williams presents for recheck of cold spells. Resolved once amitriptyline out of her system. Not sleeping over 2-3 hours a night since Dalmane was discontinued. Dr. Sandrea Hammond working with her with new sleep med's. Anxiety is flaring and she has about one day a week of feeling down and depressed.   Depression screen [facility] 2/9 01/17/2021 01/17/2021 12/03/2020  Decreased Interest 0 0 0  Down, Depressed, Hopeless 1 0 0  PHQ - 2 Score 1 0 0  Altered sleeping 3 - 1  Tired, decreased energy 1 - 0  Change in appetite 1 - 1  Feeling bad or failure about yourself  0 - 0  Trouble concentrating 0 - 0  Moving slowly or fidgety/restless 0 - 0  Suicidal thoughts 0 - 0  PHQ-9 Score 6 - 2  Difficult doing work/chores Not difficult at all - Not difficult at all  Some recent data might be hidden    History Amber Williams has a past medical history of Anal cancer ([place]) (01/10/2019), Arthritis, Depression, DVT (deep venous thrombosis) ([place]) (2013), Essential hypertension, Family history of breast cancer, Gait abnormality (08/03/2016), GERD (gastroesophageal reflux disease) (08/03/2020), History of blood transfusion (2013), Hyperlipidemia, Hypothyroidism, Hypothyroidism, Multiple sclerosis ([place]), Neurogenic bladder (08/03/2016), Port-A-Cath in place (01/23/2019), PTSD (post-traumatic stress disorder), PTSD (post-traumatic stress disorder), Right foot drop (08/03/2016), Seasonal allergies, and Ulcer.   She has a past surgical history that includes Colon surgery; Gastric bypass (2010); Interstim Implant placement (2014); Portacath placement; Hemorrhoid surgery (N/A, 12/25/2018); Flexible sigmoidoscopy (N/A, 06/25/2019); Colonoscopy with propofol (N/A, 03/04/2020); Colonoscopy (N/A, 07/06/2020); and Interstim Implant placement (N/A, 12/27/2020).   Her family history includes Breast cancer (age of onset: 12) in her  sister; Breast cancer (age of onset: 73) in her mother; Cancer in her paternal grandmother; Diabetes in her father, maternal grandmother, and paternal grandmother; Heart disease in her maternal grandfather, maternal grandmother, and paternal grandfather; Hypertension in her father and sister; Miscarriages / Stillbirths in her sister; Stroke in her paternal grandfather.She reports that she quit smoking about 11 years ago. Her smoking use included cigarettes. She started smoking about 29 years ago. She has a 22.50 pack-year smoking history. She has never used smokeless tobacco. She reports that she does not currently use alcohol. She reports that she does not use drugs.    ROS Review of Systems  Constitutional: Negative.   HENT: Negative.    Eyes:  Negative for visual disturbance.  Respiratory:  Negative for shortness of breath.   Cardiovascular:  Negative for chest pain.  Gastrointestinal:  Negative for abdominal pain.  Musculoskeletal:  Negative for arthralgias.   Objective:  BP 110/76   Pulse 89   Temp 98.1 F (36.7 C)   Ht '5\' 5"'$  (1.651 m)   Wt 189 lb 3.2 oz (85.8 kg)   SpO2 98%   BMI 31.48 kg/m   BP Readings from Last 3 Encounters:  01/17/21 110/76  12/27/20 (!) 123/54  12/23/20 114/76    Wt Readings from Last 3 Encounters:  01/17/21 189 lb 3.2 oz (85.8 kg)  12/27/20 190 lb (86.2 kg)  12/23/20 190 lb (86.2 kg)     Physical Exam Constitutional:      General: She is not in acute distress.    Appearance: She is well-developed.  Cardiovascular:     Rate and Rhythm: Normal rate and regular rhythm.  Pulmonary:     Breath sounds: Normal breath sounds.  Musculoskeletal:        General: Normal range of motion.  Skin:    General: Skin is warm and dry.  Neurological:     Mental Status: She is alert and oriented to person, place, and time.      Assessment & Plan:   Amber Williams was seen today for follow-up.  Diagnoses and all orders for this visit:  Acquired  hypothyroidism -     TSH + free T4  Need for immunization against influenza -     Flu Vaccine QUAD 77moIM (Fluarix, Fluzone & Alfiuria Quad PF)  PTSD (post-traumatic stress disorder)  Other orders -     QUEtiapine (SEROQUEL) 25 MG tablet; Take 1 tablet (25 mg total) by mouth at bedtime.      I have discontinued Amber Williams's traMADol, nitrofurantoin (macrocrystal-monohydrate), mirabegron ER, amitriptyline, flurazepam, oxyCODONE-acetaminophen, and sulfamethoxazole-trimethoprim. I am also having her start on QUEtiapine. Additionally, I am having her maintain her Vitamin D3, Cyanocobalamin, Allergy Relief, traZODone, aspirin, omeprazole, multivitamin with minerals, DuoDERM CGF Dressing, escitalopram, furosemide, levothyroxine, and Vitamin A.  Allergies as of 01/17/2021       Reactions   Tecfidera [dimethyl Fumarate] Itching        Medication List        Accurate as of January 17, 2021  5:35 PM. If you have any questions, ask your nurse or doctor.          STOP taking these medications    amitriptyline 10 MG tablet Commonly known as: ELAVIL Stopped by: WClaretta Fraise MD   flurazepam 30 MG capsule Commonly known as: DALMANE Stopped by: WClaretta Fraise MD   mirabegron ER 25 MG Tb24 tablet Commonly known as: MYRBETRIQ Stopped by: WClaretta Fraise MD   nitrofurantoin (macrocrystal-monohydrate) 100 MG capsule Commonly known as: MACROBID Stopped by: WClaretta Fraise MD   oxyCODONE-acetaminophen 5-325 MG tablet Commonly known as: Percocet Stopped by: WClaretta Fraise MD   sulfamethoxazole-trimethoprim 800-160 MG tablet Commonly known as: BACTRIM DS Stopped by: WClaretta Fraise MD   traMADol 50 MG tablet Commonly known as: ULTRAM Stopped by: WClaretta Fraise MD       TAKE these medications    Allergy Relief 180 MG tablet Generic drug: fexofenadine Take 180 mg by mouth daily as needed for allergies.   aspirin 81 MG EC tablet Take 81 mg by mouth at bedtime.    Cyanocobalamin 5000 MCG Tbdp Take 5,000 mcg by mouth daily.   DuoDERM CGF Dressing Misc Apply every 3 1/2 days.   escitalopram 10 MG tablet Commonly known as: LEXAPRO TAKE 1 TABLET DAILY   furosemide 20 MG tablet Commonly known as: LASIX Take 20 mg by mouth daily as needed for edema.   levothyroxine 112 MCG tablet Commonly known as: SYNTHROID Take 1 tablet (112 mcg total) by mouth daily before breakfast.   multivitamin with minerals tablet Take 1 tablet by mouth daily. One a day woman   omeprazole 20 MG capsule Commonly known as: PRILOSEC Take 1 capsule (20 mg total) by mouth daily.   QUEtiapine 25 MG tablet Commonly known as: SEROQUEL Take 1 tablet (25 mg total) by mouth at bedtime. Started by: WClaretta Fraise MD   traZODone 150 MG tablet Commonly known as: DESYREL USE FROM 1/3 TO 1 TABLET ASNEEDED FOR SLEEP What changed:  how much to take how to take this when to take this additional instructions   Vitamin A 2400 MCG (8000 UT) Caps Take  8,000 Units by mouth daily.   Vitamin D3 125 MCG (5000 UT) Caps Take 5,000 Units by mouth every morning.         Follow-up: Return in about 3 months (around 04/18/2021).  [name], M.D.

## 2021-01-17 NOTE — Progress Notes (Deleted)
Subjective:  Patient ID: Amber Williams, female    DOB: [birth year]  Age: 45 y.o. MRN: [id number]  CC: Follow-up   HPI ROWENE LAGORIO presents for ***  Depression screen [facility] 2/9 01/17/2021 01/17/2021 12/03/2020  Decreased Interest 0 0 0  Down, Depressed, Hopeless 1 0 0  PHQ - 2 Score 1 0 0  Altered sleeping 3 - 1  Tired, decreased energy 1 - 0  Change in appetite 1 - 1  Feeling bad or failure about yourself  0 - 0  Trouble concentrating 0 - 0  Moving slowly or fidgety/restless 0 - 0  Suicidal thoughts 0 - 0  PHQ-9 Score 6 - 2  Difficult doing work/chores Not difficult at all - Not difficult at all  Some recent data might be hidden    History Auriella has a past medical history of Anal cancer ([place]) (01/10/2019), Arthritis, Depression, DVT (deep venous thrombosis) ([place]) (2013), Essential hypertension, Family history of breast cancer, Gait abnormality (08/03/2016), GERD (gastroesophageal reflux disease) (08/03/2020), History of blood transfusion (2013), Hyperlipidemia, Hypothyroidism, Hypothyroidism, Multiple sclerosis ([place]), Neurogenic bladder (08/03/2016), Port-A-Cath in place (01/23/2019), PTSD (post-traumatic stress disorder), PTSD (post-traumatic stress disorder), Right foot drop (08/03/2016), Seasonal allergies, and Ulcer.   She has a past surgical history that includes Colon surgery; Gastric bypass (2010); Interstim Implant placement (2014); Portacath placement; Hemorrhoid surgery (N/A, 12/25/2018); Flexible sigmoidoscopy (N/A, 06/25/2019); Colonoscopy with propofol (N/A, 03/04/2020); Colonoscopy (N/A, 07/06/2020); and Interstim Implant placement (N/A, 12/27/2020).   Her family history includes Breast cancer (age of onset: 14) in her sister; Breast cancer (age of onset: 38) in her mother; Cancer in her paternal grandmother; Diabetes in her father, maternal grandmother, and paternal grandmother; Heart disease in her maternal grandfather, maternal grandmother, and paternal grandfather; Hypertension  in her father and sister; Miscarriages / Stillbirths in her sister; Stroke in her paternal grandfather.She reports that she quit smoking about 11 years ago. Her smoking use included cigarettes. She started smoking about 29 years ago. She has a 22.50 pack-year smoking history. She has never used smokeless tobacco. She reports that she does not currently use alcohol. She reports that she does not use drugs.    ROS Review of Systems  Objective:  BP 110/76   Pulse 89   Temp 98.1 F (36.7 C)   Ht '5\' 5"'$  (1.651 m)   Wt 189 lb 3.2 oz (85.8 kg)   SpO2 98%   BMI 31.48 kg/m   BP Readings from Last 3 Encounters:  01/17/21 110/76  12/27/20 (!) 123/54  12/23/20 114/76    Wt Readings from Last 3 Encounters:  01/17/21 189 lb 3.2 oz (85.8 kg)  12/27/20 190 lb (86.2 kg)  12/23/20 190 lb (86.2 kg)     Physical Exam    Assessment & Plan:   There are no diagnoses linked to this encounter.     I have discontinued Khyra D. Schleifer's amitriptyline and flurazepam. [name] Vitamin D3, Cyanocobalamin, Allergy Relief, traZODone, aspirin, omeprazole, multivitamin with minerals, traMADol, DuoDERM CGF Dressing, escitalopram, furosemide, nitrofurantoin (macrocrystal-monohydrate), mirabegron ER, levothyroxine, Vitamin A, oxyCODONE-acetaminophen, and sulfamethoxazole-trimethoprim.  Allergies as of 01/17/2021       Reactions   Tecfidera [dimethyl Fumarate] Itching        Medication List        Accurate as of January 17, 2021  3:57 PM. If you have any questions, ask your nurse or doctor.          STOP taking these  medications    amitriptyline 10 MG tablet Commonly known as: ELAVIL Stopped by: [name], MD   flurazepam 30 MG capsule Commonly known as: DALMANE Stopped by: [name], MD       TAKE these medications    Allergy Relief 180 MG tablet Generic drug: fexofenadine Take 180 mg by mouth daily as needed for allergies.   aspirin 81  MG EC tablet Take 81 mg by mouth at bedtime.   Cyanocobalamin 5000 MCG Tbdp Take 5,000 mcg by mouth daily.   DuoDERM CGF Dressing Misc Apply every 3 1/2 days.   escitalopram 10 MG tablet Commonly known as: LEXAPRO TAKE 1 TABLET DAILY   furosemide 20 MG tablet Commonly known as: LASIX Take 20 mg by mouth daily as needed for edema.   levothyroxine 112 MCG tablet Commonly known as: SYNTHROID Take 1 tablet (112 mcg total) by mouth daily before breakfast.   mirabegron ER 25 MG Tb24 tablet Commonly known as: MYRBETRIQ Take 25 mg by mouth at bedtime.   multivitamin with minerals tablet Take 1 tablet by mouth daily. One a day woman   nitrofurantoin (macrocrystal-monohydrate) 100 MG capsule Commonly known as: MACROBID Take 1 capsule (100 mg total) by mouth 2 (two) times daily.   omeprazole 20 MG capsule Commonly known as: PRILOSEC Take 1 capsule (20 mg total) by mouth daily.   oxyCODONE-acetaminophen 5-325 MG tablet Commonly known as: Percocet Take 1 tablet by mouth every 4 (four) hours as needed for severe pain.   sulfamethoxazole-trimethoprim 800-160 MG tablet Commonly known as: BACTRIM DS Take 1 tablet by mouth 2 (two) times daily.   traMADol 50 MG tablet Commonly known as: ULTRAM Take 1 tablet (50 mg total) by mouth every 12 (twelve) hours as needed for moderate pain or severe pain.   traZODone 150 MG tablet Commonly known as: DESYREL USE FROM 1/3 TO 1 TABLET ASNEEDED FOR SLEEP What changed:  how much to take how to take this when to take this additional instructions   Vitamin A 2400 MCG (8000 UT) Caps Take 8,000 Units by mouth daily.   Vitamin D3 125 MCG (5000 UT) Caps Take 5,000 Units by mouth every morning.         Follow-up: No follow-ups on file.  [name], M.D.

## 2021-01-18 LAB — TSH+FREE T4
Free T4: 1.39 ng/dL (ref 0.82–1.77)
TSH: 0.129 u[IU]/mL — ABNORMAL LOW (ref 0.450–4.500)

## 2021-01-18 NOTE — Progress Notes (Signed)
Hello [name],  Your lab result is normal and/or stable.Some minor variations that are not significant are commonly marked abnormal, but do not represent any medical problem for you.  Best regards, [name], M.D.

## 2021-01-20 ENCOUNTER — Other Ambulatory Visit: Payer: Self-pay

## 2021-01-20 ENCOUNTER — Ambulatory Visit (HOSPITAL_COMMUNITY)
Admission: RE | Admit: 2021-01-20 | Discharge: 2021-01-20 | Disposition: A | Discharge status: Home / Self Care | Payer: Medicare HMO | Source: Ambulatory Visit | Attending: Family Medicine | Admitting: Family Medicine

## 2021-01-20 ENCOUNTER — Other Ambulatory Visit (HOSPITAL_COMMUNITY): Payer: Self-pay | Admitting: Family Medicine

## 2021-01-20 DIAGNOSIS — Z08 Encounter for follow-up examination after completed treatment for malignant neoplasm: Secondary | ICD-10-CM | POA: Diagnosis not present

## 2021-01-20 DIAGNOSIS — Z1231 Encounter for screening mammogram for malignant neoplasm of breast: Secondary | ICD-10-CM

## 2021-01-20 DIAGNOSIS — C210 Malignant neoplasm of anus, unspecified: Secondary | ICD-10-CM | POA: Diagnosis not present

## 2021-01-20 DIAGNOSIS — K625 Hemorrhage of anus and rectum: Secondary | ICD-10-CM | POA: Diagnosis not present

## 2021-01-20 DIAGNOSIS — G35 Multiple sclerosis: Secondary | ICD-10-CM | POA: Diagnosis not present

## 2021-01-20 DIAGNOSIS — Z85048 Personal history of other malignant neoplasm of rectum, rectosigmoid junction, and anus: Secondary | ICD-10-CM | POA: Diagnosis not present

## 2021-01-22 ENCOUNTER — Emergency Department (HOSPITAL_COMMUNITY)
Admission: EM | Admit: 2021-01-22 | Discharge: 2021-01-23 | Disposition: A | Discharge status: Home / Self Care | Payer: Medicare HMO | Attending: Emergency Medicine | Admitting: Emergency Medicine

## 2021-01-22 ENCOUNTER — Other Ambulatory Visit: Payer: Self-pay

## 2021-01-22 ENCOUNTER — Emergency Department (HOSPITAL_COMMUNITY): Payer: Medicare HMO

## 2021-01-22 ENCOUNTER — Encounter (HOSPITAL_COMMUNITY): Payer: Self-pay

## 2021-01-22 DIAGNOSIS — M549 Dorsalgia, unspecified: Secondary | ICD-10-CM | POA: Diagnosis not present

## 2021-01-22 DIAGNOSIS — M4856XA Collapsed vertebra, not elsewhere classified, lumbar region, initial encounter for fracture: Secondary | ICD-10-CM | POA: Diagnosis not present

## 2021-01-22 DIAGNOSIS — I10 Essential (primary) hypertension: Secondary | ICD-10-CM | POA: Insufficient documentation

## 2021-01-22 DIAGNOSIS — M545 Low back pain, unspecified: Secondary | ICD-10-CM | POA: Diagnosis not present

## 2021-01-22 DIAGNOSIS — Z87891 Personal history of nicotine dependence: Secondary | ICD-10-CM | POA: Diagnosis not present

## 2021-01-22 DIAGNOSIS — Z7982 Long term (current) use of aspirin: Secondary | ICD-10-CM | POA: Diagnosis not present

## 2021-01-22 DIAGNOSIS — I700 Atherosclerosis of aorta: Secondary | ICD-10-CM | POA: Diagnosis not present

## 2021-01-22 DIAGNOSIS — I959 Hypotension, unspecified: Secondary | ICD-10-CM | POA: Diagnosis not present

## 2021-01-22 DIAGNOSIS — N3289 Other specified disorders of bladder: Secondary | ICD-10-CM | POA: Diagnosis not present

## 2021-01-22 DIAGNOSIS — Z85048 Personal history of other malignant neoplasm of rectum, rectosigmoid junction, and anus: Secondary | ICD-10-CM | POA: Diagnosis not present

## 2021-01-22 DIAGNOSIS — M546 Pain in thoracic spine: Secondary | ICD-10-CM | POA: Diagnosis not present

## 2021-01-22 DIAGNOSIS — Z743 Need for continuous supervision: Secondary | ICD-10-CM | POA: Diagnosis not present

## 2021-01-22 DIAGNOSIS — E039 Hypothyroidism, unspecified: Secondary | ICD-10-CM | POA: Diagnosis not present

## 2021-01-22 DIAGNOSIS — Z79899 Other long term (current) drug therapy: Secondary | ICD-10-CM | POA: Diagnosis not present

## 2021-01-22 DIAGNOSIS — K828 Other specified diseases of gallbladder: Secondary | ICD-10-CM | POA: Diagnosis not present

## 2021-01-22 DIAGNOSIS — R001 Bradycardia, unspecified: Secondary | ICD-10-CM | POA: Diagnosis not present

## 2021-01-22 DIAGNOSIS — R102 Pelvic and perineal pain: Secondary | ICD-10-CM | POA: Diagnosis not present

## 2021-01-22 MED ORDER — HYDROMORPHONE HCL 1 MG/ML IJ SOLN
1.0000 mg | Freq: Once | INTRAMUSCULAR | Status: AC
  Administered 2021-01-22: 1 mg via INTRAVENOUS
  Filled 2021-01-22: qty 1

## 2021-01-22 NOTE — ED Notes (Signed)
ED Provider at bedside. 

## 2021-01-22 NOTE — ED Triage Notes (Signed)
Rcems from home with cc of severe back pain Has a bladder stimulator that was replace 1 month ago . Today it is much worse, sharp pain on incisions on lower back. Has never felt this before.   24g r hand 4mg  morphine

## 2021-01-23 ENCOUNTER — Emergency Department (HOSPITAL_COMMUNITY): Payer: Medicare HMO

## 2021-01-23 DIAGNOSIS — N3289 Other specified disorders of bladder: Secondary | ICD-10-CM | POA: Diagnosis not present

## 2021-01-23 DIAGNOSIS — M4856XA Collapsed vertebra, not elsewhere classified, lumbar region, initial encounter for fracture: Secondary | ICD-10-CM | POA: Diagnosis not present

## 2021-01-23 DIAGNOSIS — I700 Atherosclerosis of aorta: Secondary | ICD-10-CM | POA: Diagnosis not present

## 2021-01-23 DIAGNOSIS — K828 Other specified diseases of gallbladder: Secondary | ICD-10-CM | POA: Diagnosis not present

## 2021-01-23 DIAGNOSIS — I10 Essential (primary) hypertension: Secondary | ICD-10-CM | POA: Diagnosis not present

## 2021-01-23 DIAGNOSIS — R102 Pelvic and perineal pain: Secondary | ICD-10-CM | POA: Diagnosis not present

## 2021-01-23 DIAGNOSIS — M545 Low back pain, unspecified: Secondary | ICD-10-CM | POA: Diagnosis not present

## 2021-01-23 LAB — CBC WITH DIFFERENTIAL/PLATELET
Abs Immature Granulocytes: 0.01 10*3/uL (ref 0.00–0.07)
Basophils Absolute: 0 10*3/uL (ref 0.0–0.1)
Basophils Relative: 1 %
Eosinophils Absolute: 0.3 10*3/uL (ref 0.0–0.5)
Eosinophils Relative: 5 %
HCT: 42.8 % (ref 36.0–46.0)
Hemoglobin: 13.1 g/dL (ref 12.0–15.0)
Immature Granulocytes: 0 %
Lymphocytes Relative: 17 %
Lymphs Abs: 1 10*3/uL (ref 0.7–4.0)
MCH: 30.5 pg (ref 26.0–34.0)
MCHC: 30.6 g/dL (ref 30.0–36.0)
MCV: 99.8 fL (ref 80.0–100.0)
Monocytes Absolute: 0.7 10*3/uL (ref 0.1–1.0)
Monocytes Relative: 12 %
Neutro Abs: 3.7 10*3/uL (ref 1.7–7.7)
Neutrophils Relative %: 65 %
Platelets: 232 10*3/uL (ref 150–400)
RBC: 4.29 MIL/uL (ref 3.87–5.11)
RDW: 15.1 % (ref 11.5–15.5)
WBC: 5.7 10*3/uL (ref 4.0–10.5)
nRBC: 0 % (ref 0.0–0.2)

## 2021-01-23 LAB — COMPREHENSIVE METABOLIC PANEL
ALT: 24 U/L (ref 0–44)
AST: 25 U/L (ref 15–41)
Albumin: 3.5 g/dL (ref 3.5–5.0)
Alkaline Phosphatase: 100 U/L (ref 38–126)
Anion gap: 10 (ref 5–15)
BUN: 9 mg/dL (ref 6–20)
CO2: 24 mmol/L (ref 22–32)
Calcium: 9.7 mg/dL (ref 8.9–10.3)
Chloride: 106 mmol/L (ref 98–111)
Creatinine, Ser: 0.77 mg/dL (ref 0.44–1.00)
GFR, Estimated: >60 mL/min (ref >60)
Glucose, Bld: 74 mg/dL (ref 70–99)
Potassium: 3.5 mmol/L (ref 3.5–5.1)
Sodium: 140 mmol/L (ref 135–145)
Total Bilirubin: 0.3 mg/dL (ref 0.3–1.2)
Total Protein: 5.5 g/dL — ABNORMAL LOW (ref 6.5–8.1)

## 2021-01-23 LAB — URINALYSIS, ROUTINE W REFLEX MICROSCOPIC
Bacteria, UA: NONE SEEN
Bilirubin Urine: NEGATIVE
Glucose, UA: NEGATIVE mg/dL
Hgb urine dipstick: NEGATIVE
Ketones, ur: 20 mg/dL — AB
Nitrite: POSITIVE — AB
Protein, ur: 30 mg/dL — AB
Specific Gravity, Urine: 1.014 (ref 1.005–1.030)
pH: 5 (ref 5.0–8.0)

## 2021-01-23 LAB — PREGNANCY, URINE: Preg Test, Ur: NEGATIVE

## 2021-01-23 LAB — LIPASE, BLOOD: Lipase: 26 U/L (ref 11–51)

## 2021-01-23 MED ORDER — HYDROMORPHONE HCL 1 MG/ML IJ SOLN
1.0000 mg | Freq: Once | INTRAMUSCULAR | Status: AC
  Administered 2021-01-23: 1 mg via INTRAVENOUS
  Filled 2021-01-23: qty 1

## 2021-01-23 MED ORDER — METHOCARBAMOL 500 MG PO TABS
500.0000 mg | ORAL_TABLET | Freq: Once | ORAL | Status: AC
  Administered 2021-01-23: 500 mg via ORAL
  Filled 2021-01-23: qty 1

## 2021-01-23 MED ORDER — OXYCODONE-ACETAMINOPHEN 5-325 MG PO TABS
2.0000 | ORAL_TABLET | Freq: Once | ORAL | Status: DC
Start: 2021-01-23 — End: 2021-01-23
  Filled 2021-01-23: qty 2

## 2021-01-23 MED ORDER — LACTATED RINGERS IV BOLUS
1000.0000 mL | Freq: Once | INTRAVENOUS | Status: AC
  Administered 2021-01-23: 1000 mL via INTRAVENOUS

## 2021-01-23 MED ORDER — HEPARIN SOD (PORK) LOCK FLUSH 100 UNIT/ML IV SOLN
500.0000 [IU] | Freq: Once | INTRAVENOUS | Status: AC
  Administered 2021-01-23: 500 [IU]
  Filled 2021-01-23: qty 5

## 2021-01-23 MED ORDER — ONDANSETRON HCL 4 MG/2ML IJ SOLN
4.0000 mg | Freq: Once | INTRAMUSCULAR | Status: AC
  Administered 2021-01-23: 4 mg via INTRAVENOUS
  Filled 2021-01-23: qty 2

## 2021-01-23 MED ORDER — IOHEXOL 300 MG/ML  SOLN
100.0000 mL | Freq: Once | INTRAMUSCULAR | Status: AC | PRN
  Administered 2021-01-23: 100 mL via INTRAVENOUS

## 2021-01-23 MED ORDER — SODIUM CHLORIDE 0.9 % IV SOLN
2.0000 g | Freq: Once | INTRAVENOUS | Status: AC
  Administered 2021-01-23: 2 g via INTRAVENOUS
  Filled 2021-01-23: qty 20

## 2021-01-23 NOTE — ED Provider Notes (Signed)
7:30 AM-checkout from Dr. Dolly Rias to evaluate patient after CT imaging.  She presents with back pain, upper and lower, unrelenting for couple weeks.  She had operative intervention, replacement of a urinary bladder stimulator, on 12/23/2020.  She has a prescription MS, and anal cancer.  DG Lumbar Spine Complete  Result Date: 01/23/2021 CLINICAL DATA:  45 year old female with severe pain after exchange of stimulator leads. EXAM: LUMBAR SPINE - COMPLETE 4+ VIEW COMPARISON:  CT Abdomen and Pelvis 08/02/2020 and earlier. FINDINGS: Right flank generator device redemonstrated, and on the lateral view the electrical lead from the generator takes a fairly similar course across the lower sacrum as in April. The lead appears to be intact. However, in April the lead traverse the right S3 foramen. Where as now the lead traverses a contralateral left sacral foramen. The sacrum appears stable since earlier this year, including evidence of a chronic S2 deformity. Normal lumbar segmentation with chronic L2 compression fracture. Lumbar and visible lower thoracic levels appear stable. Chronic gastric surgical changes. Negative visible lung bases. No acute osseous abnormality identified. IMPRESSION: 1. The sacral stimulator lead now traverses a LEFT sacral foramen. Where as earlier this year it traversed the RIGHT S3 foramen. 2.  No acute osseous abnormality identified. Electronically Signed   By: Genevie Ann M.D.   On: 01/23/2021 04:00   DG Pelvis 1-2 Views  Result Date: 01/23/2021 CLINICAL DATA:  Evaluate for stimulator leads. Patient states recent exchange of InterStim implant, now with severe pain. EXAM: PELVIS - 1-2 VIEW COMPARISON:  None. FINDINGS: Battery pack overlies the right gluteal region. Intact leads projects over the left sacrum in the left pelvis. No evidence of focal bone lesion. The cortical margins of the bony pelvis are intact. No fracture. Pubic symphysis and sacroiliac joints are congruent. Both femoral heads  are well-seated in the respective acetabula. IMPRESSION: Pelvic stimulator with lead projecting over the left sacrum. Intact lead. Electronically Signed   By: Keith Rake M.D.   On: 01/23/2021 03:57   CT ABDOMEN PELVIS W CONTRAST  Result Date: 01/23/2021 CLINICAL DATA:  Rcems from home with cc of severe back painHas a bladder stimulator that was replace 1 month ago . Today it is much worse, sharp pain on incisions on lower back. Has never felt this before. EXAM: CT ABDOMEN AND PELVIS WITH CONTRAST TECHNIQUE: Multidetector CT imaging of the abdomen and pelvis was performed using the standard protocol following bolus administration of intravenous contrast. CONTRAST:  12mL OMNIPAQUE IOHEXOL 300 MG/ML  SOLN COMPARISON:  08/02/2020 FINDINGS: Lower chest: Mild dependent atelectasis in the lower lobes. Lung bases otherwise clear. No acute findings. Hepatobiliary: Gallbladder is distended. Wall is mildly thickened with hazy opacity in the adjacent fat. No visible gallstone. Common bile duct measures 5 mm. Normal liver. Pancreas: Unremarkable. No pancreatic ductal dilatation or surrounding inflammatory changes. Spleen: Normal in size without focal abnormality. Adrenals/Urinary Tract: No adrenal masses. Kidneys normal in size, orientation and position with symmetric enhancement and excretion. No renal masses, stones or hydronephrosis. Normal ureters. Bladder is distended. Posterior/superior wall is mildly thickened. No bladder mass or stone. Stomach/Bowel: Previous gastric bypass, changes stable from the prior CT. No stomach wall thickening or inflammation. Small bowel and colon are normal in caliber. No wall thickening. No inflammation. No evidence of appendicitis. Vascular/Lymphatic: Mild aortic atherosclerosis. No aneurysm. No enlarged lymph nodes. Reproductive: Uterus and bilateral adnexa are unremarkable. Other: No abdominal wall hernia or ascites. Prominent superficial veins cross the lower chest and anterior  abdominal wall,  unchanged from the prior CT. Musculoskeletal: Moderate compression fracture of L2, chronic and stable from the prior CT. No acute fractures. No bone lesions. IMPRESSION: 1. Distended gallbladder with wall thickening and mild adjacent inflammatory changes. Findings consistent with acute cholecystitis in the proper clinical setting. No visible stone on CT. Consider follow-up limited right upper quadrant ultrasound for further assessment. 2. Mild bladder wall thickening mostly along the posterior and superior aspect consistent with cystitis in the proper clinical setting. 3. No other evidence of an acute abnormality within the abdomen or pelvis. 4. Mild aortic atherosclerosis. Electronically Signed   By: Lajean Manes M.D.   On: 01/23/2021 08:56      Reevaluation-9:02 AM-she is resting comfortably in bed.  She is mildly tremulous.  Was able to reexamine her.  She rolled over and showed me the surgical site, which is well-healed and nontender to palpation.  There are no areas of localized tenderness of the back either upper or lower.  She easily rolled back onto her back so I could examine her abdomen which is soft and had minimal mid abdominal tenderness to palpation.  There is no palpable mass.  No specific tenderness over the urinary bladder, or gallbladder.  I explained the findings to the patient and discussed things with her.  She stated that she has a bottle of Percocet at home, but that when she takes it it does not help her pain.  She states she has been using narcotics for 30 years and typically tries to avoid taking it.  She does not require another prescription for Percocet.  I talked to her about using 2 tablets to see if that would help her severe pain.  She said she would probably try that.  She has a follow-up appointment, tomorrow, with both neurology who manages her MS, and urology who did her surgical procedure recently.  I explained to her that it was safe to go home that her  vitals were reassuring as well as her laboratory testing and imaging.  The patient stated that she was satisfactory with that plan.     Daleen Bo, MD 01/23/21 [phone]

## 2021-01-23 NOTE — ED Notes (Signed)
Pt sat up to get dressed and became nauseous, dry heaves noted at this time. Dr. Eulis Foster notified of same, meds ordered at this time.

## 2021-01-23 NOTE — Discharge Instructions (Addendum)
The testing today did not show any serious problems.  It is okay to take 2 Percocet tablets every 6 hours if needed for pain. Follow-up with your neurologist and urologist, tomorrow as scheduled.
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
## 2021-01-23 NOTE — ED Notes (Signed)
Patient transported to X-ray 

## 2021-01-24 ENCOUNTER — Ambulatory Visit (INDEPENDENT_AMBULATORY_CARE_PROVIDER_SITE_OTHER): Payer: Medicare HMO | Admitting: Urology

## 2021-01-24 ENCOUNTER — Encounter: Payer: Self-pay | Admitting: Urology

## 2021-01-24 ENCOUNTER — Other Ambulatory Visit: Payer: Self-pay

## 2021-01-24 VITALS — BP 103/72 | HR 128

## 2021-01-24 DIAGNOSIS — N319 Neuromuscular dysfunction of bladder, unspecified: Secondary | ICD-10-CM

## 2021-01-24 DIAGNOSIS — G5731 Lesion of lateral popliteal nerve, right lower limb: Secondary | ICD-10-CM | POA: Diagnosis not present

## 2021-01-24 DIAGNOSIS — N3000 Acute cystitis without hematuria: Secondary | ICD-10-CM

## 2021-01-24 DIAGNOSIS — R5383 Other fatigue: Secondary | ICD-10-CM | POA: Diagnosis not present

## 2021-01-24 DIAGNOSIS — M21371 Foot drop, right foot: Secondary | ICD-10-CM | POA: Diagnosis not present

## 2021-01-24 DIAGNOSIS — G35 Multiple sclerosis: Secondary | ICD-10-CM | POA: Diagnosis not present

## 2021-01-24 MED ORDER — SULFAMETHOXAZOLE-TRIMETHOPRIM 800-160 MG PO TABS: 1.0000 | ORAL_TABLET | Freq: Two times a day (BID) | ORAL | 0 refills | Status: DC
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
## 2021-01-24 NOTE — Patient Instructions (Signed)
Sacral Nerve Stimulator Implantation, Care After The following information offers guidance on how to care for yourself after your procedure. Your health care provider may also give you more specific instructions. If you have problems or questions, contact your health care provider. What can I expect after the procedure? After the procedure, it is common to have soreness or pain in the incision area. Follow these instructions at home: Medicines Take over-the-counter and prescription medicines only as told by your health care provider. If you were prescribed an antibiotic medicine, take it as told by your health care provider. Do not stop using the antibiotic even if you start to feel better. Ask your health care provider if the medicine prescribed to you requires you to avoid driving or using machinery. Incision care   Follow instructions from your health care provider about how to take care of your incisions. Make sure you: Wash your hands with soap and water for at least 20 seconds before and after you change your bandage (dressing). If soap and water are not available, use hand sanitizer. Change your dressing as told by your health care provider. Leave stitches (sutures), staples, skin glue, or adhesive strips in place. These skin closures may need to stay in place for 2 weeks or longer. If adhesive strip edges start to loosen and curl up, you may trim the loose edges. Do not remove adhesive strips completely unless your health care provider tells you to do that. Check your incision area every day for signs of infection. Check for: Redness, swelling, or more pain. Fluid or blood. Warmth. Pus or a bad smell. Activity Follow instructions from your health care provider about any activity restrictions. You may need to avoid: Bending, twisting, or stretching. Having sex. Do not lift anything that is heavier than 10 lb (4.5 kg), or the limit that you are told, until your health care provider says  that it is safe. Return to your normal activities as told by your health care provider. Ask your health care provider what activities are safe for you. Bathing Do not take baths, swim, or use a hot tub until your health care provider approves. Ask your health care provider if you may take showers. You may only be allowed to take sponge baths. Keep the dressing dry until your health care provider says it can be removed. Using the stimulator You will be given a remote control device. This device will allow you to turn your sacral nerve stimulator on and off. Follow instructions from your health care provider about how to use this device. Tell all of your health care providers that you have this device. Remind them that you have the device before they do any tests or procedures. General instructions If you were given a sedative during the procedure, it can affect you for several hours. Do not drive or operate machinery until your health care provider says that it is safe. Keep all follow-up visits. This is important. Your health care provider may need to adjust the stimulator over several visits until it works well for you. Contact a health care provider if: Your device stops working. The device is not helping your symptoms. You have a fever or chills. You have pus or a bad smell coming from an incision. You have redness, swelling, or more pain around an incision. You have fluid or blood coming from an incision. An incision feels warm to the touch. Summary After the procedure, it is common to have soreness or pain in the  incision area. Follow instructions from your health care provider about how to take care of your incision. Also, follow instructions about any activity restrictions. You may need to avoid activities that involve a lot of bending, twisting, or stretching. Tell all of your health care providers that you have this device. Remind them that you have the device before they do any tests  or procedures. Contact your health care provider if your device stops working, or if it is not helping to treat your symptoms. Contact your health care provider if you have a fever, or if there is redness, warmth, swelling, pain, pus, or a bad smell in or around an incision. This information is not intended to replace advice given to you by your health care provider. Make sure you discuss any questions you have with your health care provider. Document Revised: 11/21/2019 Document Reviewed: 11/21/2019 Elsevier Patient Education  [place].

## 2021-01-24 NOTE — Progress Notes (Signed)
Urological Symptom Review  Patient is experiencing the following symptoms: Frequent urination Hard to postpone urination Burning/pain with urination Get up at night to urinate Leakage of urine Trouble starting stream Urinary tract infection   Review of Systems  Gastrointestinal (upper)  : Negative for upper GI symptoms  Gastrointestinal (lower) : Constipation  Constitutional : Weight loss Fatigue  Skin: Negative for skin symptoms  Eyes: Negative for eye symptoms  Ear/Nose/Throat : Negative for Ear/Nose/Throat symptoms  Hematologic/Lymphatic: Easy bruising  Cardiovascular : Negative for cardiovascular symptoms  Respiratory : Negative for respiratory symptoms  Endocrine: Negative for endocrine symptoms  Musculoskeletal: Back pain Joint pain  Neurological: Negative for neurological symptoms  Psychologic: Depression Anxiety

## 2021-01-25 LAB — URINE CULTURE: Culture: >=100000 — AB

## 2021-01-26 ENCOUNTER — Telehealth: Payer: Self-pay

## 2021-01-26 NOTE — Telephone Encounter (Signed)
Post ED Visit - Positive Culture Follow-up  Culture report reviewed by antimicrobial stewardship pharmacist: [facility] Team [x]  Joetta Manners, Pharm.D.BCCCP []  Heide Guile, Pharm.D., BCPS AQ-ID []  Parks Neptune, Pharm.D., BCPS []  Alycia Rossetti, Pharm.D., BCPS []  [place], Pharm.D., BCPS, AAHIVP []  Legrand Como, Pharm.D., BCPS, AAHIVP []  Salome Arnt, PharmD, BCPS []  Johnnette Gourd, PharmD, BCPS []  Hughes Better, PharmD, BCPS []  Leeroy Cha, PharmD []  Laqueta Linden, PharmD, BCPS []  Albertina Parr, PharmD  [place] Team []  Leodis Sias, PharmD []  Lindell Spar, PharmD []  Royetta Asal, PharmD []  Graylin Shiver, Rph []  Rema Fendt) Glennon Mac, PharmD []  Arlyn Dunning, PharmD []  Netta Cedars, PharmD []  Dia Sitter, PharmD []  Leone Haven, PharmD []  Gretta Arab, PharmD []  Theodis Shove, PharmD []  Peggyann Juba, PharmD []  Reuel Boom, PharmD   Positive urine culture Treated with Bactrim prescribed on 01/24/2021 times 7 days by urologist, organism sensitive to the same and no further patient follow-up is required at this time.  Glennon Hamilton 01/26/2021, 9:09 AM

## 2021-01-31 ENCOUNTER — Ambulatory Visit: Payer: Medicare HMO

## 2021-02-08 DIAGNOSIS — Z79899 Other long term (current) drug therapy: Secondary | ICD-10-CM | POA: Diagnosis not present

## 2021-02-08 DIAGNOSIS — M21371 Foot drop, right foot: Secondary | ICD-10-CM | POA: Diagnosis not present

## 2021-02-08 DIAGNOSIS — G4700 Insomnia, unspecified: Secondary | ICD-10-CM | POA: Diagnosis not present

## 2021-02-08 DIAGNOSIS — E559 Vitamin D deficiency, unspecified: Secondary | ICD-10-CM | POA: Diagnosis not present

## 2021-02-08 DIAGNOSIS — G35 Multiple sclerosis: Secondary | ICD-10-CM | POA: Diagnosis not present

## 2021-02-08 DIAGNOSIS — R5383 Other fatigue: Secondary | ICD-10-CM | POA: Diagnosis not present

## 2021-02-10 ENCOUNTER — Other Ambulatory Visit: Payer: Self-pay

## 2021-02-10 ENCOUNTER — Inpatient Hospital Stay (HOSPITAL_COMMUNITY): Payer: Medicare HMO | Attending: Hematology

## 2021-02-10 DIAGNOSIS — G35 Multiple sclerosis: Secondary | ICD-10-CM | POA: Insufficient documentation

## 2021-02-10 DIAGNOSIS — C210 Malignant neoplasm of anus, unspecified: Secondary | ICD-10-CM | POA: Insufficient documentation

## 2021-02-10 DIAGNOSIS — Z993 Dependence on wheelchair: Secondary | ICD-10-CM | POA: Diagnosis not present

## 2021-02-10 DIAGNOSIS — Z87891 Personal history of nicotine dependence: Secondary | ICD-10-CM | POA: Diagnosis not present

## 2021-02-10 LAB — CBC WITH DIFFERENTIAL/PLATELET
Abs Immature Granulocytes: 0.03 10*3/uL (ref 0.00–0.07)
Basophils Absolute: 0.1 10*3/uL (ref 0.0–0.1)
Basophils Relative: 1 %
Eosinophils Absolute: 0.1 10*3/uL (ref 0.0–0.5)
Eosinophils Relative: 2 %
HCT: 42.3 % (ref 36.0–46.0)
Hemoglobin: 13 g/dL (ref 12.0–15.0)
Immature Granulocytes: 1 %
Lymphocytes Relative: 13 %
Lymphs Abs: 0.8 10*3/uL (ref 0.7–4.0)
MCH: 30.7 pg (ref 26.0–34.0)
MCHC: 30.7 g/dL (ref 30.0–36.0)
MCV: 99.8 fL (ref 80.0–100.0)
Monocytes Absolute: 0.6 10*3/uL (ref 0.1–1.0)
Monocytes Relative: 9 %
Neutro Abs: 4.6 10*3/uL (ref 1.7–7.7)
Neutrophils Relative %: 74 %
Platelets: 288 10*3/uL (ref 150–400)
RBC: 4.24 MIL/uL (ref 3.87–5.11)
RDW: 15.7 % — ABNORMAL HIGH (ref 11.5–15.5)
WBC: 6.1 10*3/uL (ref 4.0–10.5)
nRBC: 0 % (ref 0.0–0.2)

## 2021-02-10 LAB — COMPREHENSIVE METABOLIC PANEL
ALT: 16 U/L (ref 0–44)
AST: 22 U/L (ref 15–41)
Albumin: 3.9 g/dL (ref 3.5–5.0)
Alkaline Phosphatase: 95 U/L (ref 38–126)
Anion gap: 8 (ref 5–15)
BUN: 8 mg/dL (ref 6–20)
CO2: 22 mmol/L (ref 22–32)
Calcium: 9.9 mg/dL (ref 8.9–10.3)
Chloride: 109 mmol/L (ref 98–111)
Creatinine, Ser: 0.87 mg/dL (ref 0.44–1.00)
GFR, Estimated: >60 mL/min (ref >60)
Glucose, Bld: 106 mg/dL — ABNORMAL HIGH (ref 70–99)
Potassium: 3.3 mmol/L — ABNORMAL LOW (ref 3.5–5.1)
Sodium: 139 mmol/L (ref 135–145)
Total Bilirubin: 0.7 mg/dL (ref 0.3–1.2)
Total Protein: 5.8 g/dL — ABNORMAL LOW (ref 6.5–8.1)
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
## 2021-02-17 ENCOUNTER — Other Ambulatory Visit: Payer: Self-pay

## 2021-02-17 ENCOUNTER — Encounter: Payer: Self-pay | Admitting: Urology

## 2021-02-17 ENCOUNTER — Ambulatory Visit (HOSPITAL_COMMUNITY): Payer: Medicare HMO | Admitting: Hematology

## 2021-02-17 ENCOUNTER — Inpatient Hospital Stay (HOSPITAL_BASED_OUTPATIENT_CLINIC_OR_DEPARTMENT_OTHER): Payer: Medicare HMO | Admitting: Hematology

## 2021-02-17 DIAGNOSIS — C210 Malignant neoplasm of anus, unspecified: Secondary | ICD-10-CM

## 2021-02-17 DIAGNOSIS — G35 Multiple sclerosis: Secondary | ICD-10-CM | POA: Diagnosis not present

## 2021-02-17 DIAGNOSIS — Z87891 Personal history of nicotine dependence: Secondary | ICD-10-CM | POA: Diagnosis not present

## 2021-02-17 DIAGNOSIS — Z993 Dependence on wheelchair: Secondary | ICD-10-CM | POA: Diagnosis not present

## 2021-02-17 NOTE — Patient Instructions (Signed)
Amber Williams at [facility] Discharge Instructions  You were seen and examined today by Dr. Delton Coombes. Dr. Delton Coombes reviewed your recent CT scan and blood work. From an oncology standpoint, you appear to be doing well!  For your constipation, each vegetables and take 2 Colace each morning. You can take up to two in the morning and two in the evening if necessary.  Follow-up with Dr. Arnoldo Morale for sigmoidoscopy in March 2023. Follow-up with Dr. Delton Coombes as scheduled.   Thank you for choosing [place] at [facility] to provide your oncology and hematology care.  To afford each patient quality time with our provider, please arrive at least 15 minutes before your scheduled appointment time.   If you have a lab appointment with the [place] please come in thru the Main Entrance and check in at the main information desk.  You need to re-schedule your appointment should you arrive 10 or more minutes late.  We strive to give you quality time with our providers, and arriving late affects you and other patients whose appointments are after yours.  Also, if you no show three or more times for appointments you may be dismissed from the clinic at the providers discretion.     Again, thank you for choosing [facility].  Our hope is that these requests will decrease the amount of time that you wait before being seen by our physicians.       _____________________________________________________________  Should you have questions after your visit to [facility], please contact our office at [phone] and follow the prompts.  Our office hours are 8:00 a.m. and 4:30 p.m. Monday - Friday.  Please note that voicemails left after 4:00 p.m. may not be returned until the following business day.  We are closed weekends and major holidays.  You do have access to a nurse 24-7, just call the main number to the clinic [phone] and do not  press any options, hold on the line and a nurse will answer the phone.    For prescription refill requests, have your pharmacy contact our office and allow 72 hours.    Due to Covid, you will need to wear a mask upon entering the hospital. If you do not have a mask, a mask will be given to you at the Main Entrance upon arrival. For doctor visits, patients may have 1 support person age 43 or older with them. For treatment visits, patients can not have anyone with them due to social distancing guidelines and our immunocompromised population.

## 2021-02-19 DIAGNOSIS — G35 Multiple sclerosis: Secondary | ICD-10-CM | POA: Diagnosis not present

## 2021-02-21 NOTE — Telephone Encounter (Signed)
Please advise 

## 2021-02-23 ENCOUNTER — Other Ambulatory Visit: Payer: Self-pay

## 2021-02-23 ENCOUNTER — Ambulatory Visit (INDEPENDENT_AMBULATORY_CARE_PROVIDER_SITE_OTHER): Payer: Medicare HMO | Admitting: Urology

## 2021-02-23 ENCOUNTER — Encounter: Payer: Self-pay | Admitting: Urology

## 2021-02-23 VITALS — BP 138/76 | HR 102

## 2021-02-23 DIAGNOSIS — N3000 Acute cystitis without hematuria: Secondary | ICD-10-CM

## 2021-02-23 DIAGNOSIS — R32 Unspecified urinary incontinence: Secondary | ICD-10-CM | POA: Diagnosis not present

## 2021-02-23 DIAGNOSIS — M869 Osteomyelitis, unspecified: Secondary | ICD-10-CM

## 2021-02-23 LAB — MICROSCOPIC EXAMINATION
Renal Epithel, UA: NONE SEEN /hpf
WBC, UA: >30 /hpf — AB (ref 0–5)

## 2021-02-23 LAB — URINALYSIS, ROUTINE W REFLEX MICROSCOPIC
Bilirubin, UA: NEGATIVE
Glucose, UA: NEGATIVE
Ketones, UA: NEGATIVE
Nitrite, UA: POSITIVE — AB
Protein,UA: NEGATIVE
Specific Gravity, UA: 1.01 (ref 1.005–1.030)
Urobilinogen, Ur: 1 mg/dL (ref 0.2–1.0)
pH, UA: 6 (ref 5.0–7.5)

## 2021-02-23 MED ORDER — MELOXICAM 7.5 MG PO TABS: 7.5000 mg | ORAL_TABLET | Freq: Every day | ORAL | 3 refills | Status: DC

## 2021-02-23 MED ORDER — MIRABEGRON ER 50 MG PO TB24: 50.0000 mg | ORAL_TABLET | Freq: Every day | ORAL | 3 refills | Status: DC

## 2021-02-23 NOTE — Patient Instructions (Signed)
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
## 2021-02-23 NOTE — Progress Notes (Signed)
Urological Symptom Review  Patient is experiencing the following symptoms: Frequent urination Hard to postpone urination Leakage of urine Trouble starting stream Have to strain to urinate   Review of Systems  Gastrointestinal (upper)  : Negative for upper GI symptoms  Gastrointestinal (lower) : Constipation  Constitutional : Weight loss Fatigue  Skin: Negative for skin symptoms  Eyes: Negative for eye symptoms  Ear/Nose/Throat : Negative for Ear/Nose/Throat symptoms  Hematologic/Lymphatic: Easy bruising  Cardiovascular : Negative for cardiovascular symptoms  Respiratory : Negative for respiratory symptoms  Endocrine: Negative for endocrine symptoms  Musculoskeletal: Back pain  Neurological: Negative for neurological symptoms  Psychologic: Negative for psychiatric symptoms

## 2021-02-24 ENCOUNTER — Encounter (HOSPITAL_COMMUNITY): Payer: Self-pay | Admitting: Hematology

## 2021-02-24 ENCOUNTER — Encounter: Payer: Self-pay | Admitting: Urology

## 2021-02-26 LAB — URINE CULTURE

## 2021-02-28 ENCOUNTER — Other Ambulatory Visit: Payer: Self-pay

## 2021-02-28 DIAGNOSIS — N3000 Acute cystitis without hematuria: Secondary | ICD-10-CM

## 2021-02-28 MED ORDER — SULFAMETHOXAZOLE-TRIMETHOPRIM 800-160 MG PO TABS
1.0000 | ORAL_TABLET | Freq: Two times a day (BID) | ORAL | 0 refills | Status: DC
Start: 2021-02-28 — End: 2021-03-30

## 2021-03-02 ENCOUNTER — Telehealth: Payer: Self-pay | Admitting: Urology

## 2021-03-02 NOTE — Telephone Encounter (Signed)
Per Rosalio Loud at [facility] [phone]), patient declined consult appointment with Dr. Matilde Sprang for neurogenic bladder.

## 2021-03-04 ENCOUNTER — Ambulatory Visit: Payer: Medicare HMO | Admitting: Urology

## 2021-03-04 DIAGNOSIS — Z0100 Encounter for examination of eyes and vision without abnormal findings: Secondary | ICD-10-CM | POA: Diagnosis not present

## 2021-03-07 ENCOUNTER — Telehealth: Payer: Self-pay

## 2021-03-07 DIAGNOSIS — Z0100 Encounter for examination of eyes and vision without abnormal findings: Secondary | ICD-10-CM | POA: Diagnosis not present

## 2021-03-07 NOTE — Telephone Encounter (Signed)
Patient was placed on bactrim 10/31 and completed and symptoms resolved.

## 2021-03-07 NOTE — Telephone Encounter (Signed)
-----   Message from Cleon Gustin, MD sent at 03/05/2021  6:19 AM EDT ----- Macrobid 100mg  BID for 7 days ----- Message ----- From: Dorisann Frames, RN Sent: 02/28/2021   8:21 AM EDT To: Cleon Gustin, MD  No treatment started

## 2021-03-11 ENCOUNTER — Other Ambulatory Visit: Payer: Self-pay

## 2021-03-11 ENCOUNTER — Encounter: Payer: Self-pay | Admitting: Urology

## 2021-03-11 ENCOUNTER — Ambulatory Visit (INDEPENDENT_AMBULATORY_CARE_PROVIDER_SITE_OTHER): Payer: Medicare HMO | Admitting: Urology

## 2021-03-11 VITALS — BP 113/73 | HR 103 | Temp 98.6°F

## 2021-03-11 DIAGNOSIS — M869 Osteomyelitis, unspecified: Secondary | ICD-10-CM | POA: Diagnosis not present

## 2021-03-11 DIAGNOSIS — R32 Unspecified urinary incontinence: Secondary | ICD-10-CM | POA: Diagnosis not present

## 2021-03-11 DIAGNOSIS — N3000 Acute cystitis without hematuria: Secondary | ICD-10-CM

## 2021-03-11 LAB — MICROSCOPIC EXAMINATION
Epithelial Cells (non renal): >10 /hpf — AB (ref 0–10)
Renal Epithel, UA: NONE SEEN /hpf
WBC, UA: >30 /hpf — AB (ref 0–5)

## 2021-03-11 LAB — URINALYSIS, ROUTINE W REFLEX MICROSCOPIC
Bilirubin, UA: NEGATIVE
Glucose, UA: NEGATIVE
Ketones, UA: NEGATIVE
Nitrite, UA: NEGATIVE
Protein,UA: NEGATIVE
Specific Gravity, UA: 1.01 (ref 1.005–1.030)
Urobilinogen, Ur: 0.2 mg/dL (ref 0.2–1.0)
pH, UA: 6.5 (ref 5.0–7.5)

## 2021-03-11 MED ORDER — TRAMADOL HCL 50 MG PO TABS
50.0000 mg | ORAL_TABLET | Freq: Four times a day (QID) | ORAL | 0 refills | Status: DC | PRN
Start: 2021-03-11 — End: 2021-05-09

## 2021-03-11 MED ORDER — CEPHALEXIN 500 MG PO CAPS: 500.0000 mg | ORAL_CAPSULE | Freq: Four times a day (QID) | ORAL | 0 refills | Status: DC
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
## 2021-03-11 NOTE — Progress Notes (Signed)
Urological Symptom Review  Patient is experiencing the following symptoms: Frequent urination Hard to postpone urination Get up at night to urinate Leakage of urine Stream starts and stops Trouble starting stream Have to strain to urinate   Review of Systems  Gastrointestinal (upper)  : Negative for upper GI symptoms  Gastrointestinal (lower) : Constipation  Constitutional : Negative for symptoms  Skin: Negative for skin symptoms  Eyes: Negative for eye symptoms  Ear/Nose/Throat : Negative for Ear/Nose/Throat symptoms  Hematologic/Lymphatic: Easy bruising  Cardiovascular : Negative for cardiovascular symptoms  Respiratory : Negative for respiratory symptoms  Endocrine: Negative for endocrine symptoms  Musculoskeletal: Back pain  Neurological: Negative for neurological symptoms  Psychologic: Anxiety

## 2021-03-14 ENCOUNTER — Encounter: Payer: Self-pay | Admitting: Urology

## 2021-03-15 ENCOUNTER — Encounter: Payer: Self-pay | Admitting: Urology

## 2021-03-15 NOTE — Telephone Encounter (Signed)
Please review and advise on medication and MRI status.

## 2021-03-15 NOTE — Patient Instructions (Signed)

## 2021-03-18 ENCOUNTER — Telehealth: Payer: Self-pay

## 2021-03-18 ENCOUNTER — Encounter: Payer: Self-pay | Admitting: Urology

## 2021-03-18 NOTE — Telephone Encounter (Signed)
Please advise for pain.

## 2021-03-18 NOTE — Telephone Encounter (Signed)
Pt called and wanting to know the status of her MRI and Medication she is a lot of pain, please advise

## 2021-03-19 MED ORDER — OXYCODONE-ACETAMINOPHEN 5-325 MG PO TABS: 1.0000 | ORAL_TABLET | ORAL | 0 refills | Status: DC | PRN

## 2021-03-20 ENCOUNTER — Other Ambulatory Visit: Payer: Self-pay | Admitting: Family Medicine

## 2021-03-20 DIAGNOSIS — G4701 Insomnia due to medical condition: Secondary | ICD-10-CM

## 2021-03-22 DIAGNOSIS — G35 Multiple sclerosis: Secondary | ICD-10-CM | POA: Diagnosis not present

## 2021-03-23 DIAGNOSIS — R69 Illness, unspecified: Secondary | ICD-10-CM | POA: Diagnosis not present

## 2021-03-23 DIAGNOSIS — Z20822 Contact with and (suspected) exposure to covid-19: Secondary | ICD-10-CM | POA: Diagnosis not present

## 2021-03-23 DIAGNOSIS — J029 Acute pharyngitis, unspecified: Secondary | ICD-10-CM | POA: Diagnosis not present

## 2021-03-24 ENCOUNTER — Encounter: Payer: Self-pay | Admitting: Family Medicine

## 2021-03-28 ENCOUNTER — Other Ambulatory Visit (HOSPITAL_COMMUNITY): Payer: Self-pay | Admitting: Urology

## 2021-03-28 ENCOUNTER — Telehealth: Payer: Self-pay

## 2021-03-28 ENCOUNTER — Ambulatory Visit (HOSPITAL_COMMUNITY)
Admission: RE | Admit: 2021-03-28 | Discharge: 2021-03-28 | Disposition: A | Discharge status: Home / Self Care | Payer: Medicare HMO | Source: Ambulatory Visit | Attending: Urology | Admitting: Urology

## 2021-03-28 ENCOUNTER — Ambulatory Visit (INDEPENDENT_AMBULATORY_CARE_PROVIDER_SITE_OTHER): Payer: Medicare HMO | Admitting: Urology

## 2021-03-28 ENCOUNTER — Other Ambulatory Visit: Payer: Self-pay

## 2021-03-28 ENCOUNTER — Other Ambulatory Visit: Payer: Self-pay | Admitting: Urology

## 2021-03-28 VITALS — BP 116/76 | HR 108

## 2021-03-28 DIAGNOSIS — R52 Pain, unspecified: Secondary | ICD-10-CM | POA: Diagnosis not present

## 2021-03-28 DIAGNOSIS — M533 Sacrococcygeal disorders, not elsewhere classified: Secondary | ICD-10-CM | POA: Diagnosis not present

## 2021-03-28 DIAGNOSIS — M869 Osteomyelitis, unspecified: Secondary | ICD-10-CM

## 2021-03-28 DIAGNOSIS — R32 Unspecified urinary incontinence: Secondary | ICD-10-CM

## 2021-03-28 NOTE — Progress Notes (Signed)
Urological Symptom Review  Patient is experiencing the following symptoms: Hard to postpone urination Get up at night to urinate Leakage of urine Stream starts and stops Trouble starting stream Have to strain to urinate   Review of Systems  Gastrointestinal (upper)  : Negative for upper GI symptoms  Gastrointestinal (lower) : Negative for lower GI symptoms  Constitutional : Negative for symptoms  Skin: Negative for skin symptoms  Eyes: Negative for eye symptoms  Ear/Nose/Throat : Negative for Ear/Nose/Throat symptoms  Hematologic/Lymphatic: Negative for Hematologic/Lymphatic symptoms  Cardiovascular : Negative for cardiovascular symptoms  Respiratory : Negative for respiratory symptoms  Endocrine: Negative for endocrine symptoms  Musculoskeletal: Back pain  Neurological: Negative for neurological symptoms  Psychologic: Anxiety
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
## 2021-03-28 NOTE — Telephone Encounter (Signed)
Patient called and notified of xray order that was placed and to go before office appointment today. Patient voiced understandingl

## 2021-03-30 ENCOUNTER — Encounter (HOSPITAL_COMMUNITY): Payer: Self-pay | Admitting: Hematology

## 2021-03-30 ENCOUNTER — Ambulatory Visit (INDEPENDENT_AMBULATORY_CARE_PROVIDER_SITE_OTHER): Payer: Medicare HMO | Admitting: Nurse Practitioner

## 2021-03-30 ENCOUNTER — Encounter: Payer: Self-pay | Admitting: Nurse Practitioner

## 2021-03-30 VITALS — BP 138/78 | HR 102 | Temp 97.9°F | Resp 20 | Ht 65.0 in | Wt 179.0 lb

## 2021-03-30 DIAGNOSIS — R1319 Other dysphagia: Secondary | ICD-10-CM | POA: Diagnosis not present

## 2021-03-30 DIAGNOSIS — G4700 Insomnia, unspecified: Secondary | ICD-10-CM | POA: Diagnosis not present

## 2021-03-30 MED ORDER — QUETIAPINE FUMARATE 50 MG PO TABS: 50.0000 mg | ORAL_TABLET | Freq: Every day | ORAL | 0 refills | Status: DC

## 2021-03-30 NOTE — Assessment & Plan Note (Signed)
Increase Seroquel from 25 mg tablet by mouth  to 50 mg tablet by mouth at bedtime for insomnia, and anxiety symptoms not well controlled. Rx sent to pharmacy.

## 2021-03-30 NOTE — Assessment & Plan Note (Addendum)
Symptoms are new in the past few weeks with worsening anxiety.  Patient reports choking with food and water.  After assessment patient will benefit from GI consult doing a swallow study to rule out dysphagia.  Education provided to patient printed handouts given patient verbalized understanding. referral completed.  Continue Biotene for dry mouth.

## 2021-03-30 NOTE — Patient Instructions (Signed)
Dysphagia Dysphagia is trouble swallowing. This condition occurs when solids and liquids stick in a person's throat on the way down to the stomach, or when food takes longer to get to the stomach than usual. You may have problems swallowing food, liquids, or both. You may also have pain while trying to swallow. It may take you more time and effort to swallow something. What are the causes? This condition may be caused by: Muscle problems. These may make it difficult for you to move food and liquids through the esophagus, which is the tube that connects your mouth to your stomach. Blockages. You may have ulcers, scar tissue, or inflammation that blocks the normal passage of food and liquids. Causes of these problems include: Acid reflux from your stomach into your esophagus (gastroesophageal reflux). Infections. Radiation treatment for cancer. Medicines taken without enough fluids to wash them down into your stomach. Stroke. This can affect the nerves and make it difficult to swallow. Nerve problems. These prevent signals from being sent to the muscles of your esophagus to squeeze (contract) and move what you swallow down to your stomach. Globus pharyngeus. This is a common problem that involves a feeling like something is stuck in your throat or a sense of trouble with swallowing, even though nothing is wrong with the swallowing passages. Certain conditions, such as cerebral palsy or Parkinson's disease. What are the signs or symptoms? Common symptoms of this condition include: A feeling that solids or liquids are stuck in your throat on the way down to the stomach. Pain while swallowing. Coughing or gagging while trying to swallow. Other symptoms include: Food moving back from your stomach to your mouth (regurgitation). Noises coming from your throat. Chest discomfort when swallowing. A feeling of fullness when swallowing. Drooling, especially when the throat is blocked. Heartburn. How is  this diagnosed? This condition may be diagnosed by: Barium swallow X-ray. In this test, you will swallow a white liquid that sticks to the inside of your esophagus. X-ray images are then taken. Endoscopy. In this test, a flexible telescope is inserted down your throat to look at your esophagus and your stomach. CT scans or an MRI. How is this treated? Treatment for dysphagia depends on the cause of this condition: If the dysphagia is caused by acid reflux or infection, medicines may be used. These may include antibiotics or heartburn medicines. If the dysphagia is caused by problems with the muscles, swallowing therapy may be used to help you strengthen your swallowing muscles. You may have to do specific exercises to strengthen the muscles or stretch them. If the dysphagia is caused by a blockage or mass, procedures to remove the blockage may be done. You may need surgery and a feeding tube. You may need to make diet changes. Ask your health care provider for specific instructions. Follow these instructions at home: Medicines Take over-the-counter and prescription medicines only as told by your health care provider. If you were prescribed an antibiotic medicine, take it as told by your health care provider. Do not stop taking the antibiotic even if you start to feel better. Eating and drinking  Make any diet changes as told by your health care provider. Work with a diet and nutrition specialist (dietitian) to create an eating plan that will help you get the nutrients you need in order to stay healthy. Eat soft foods that are easier to swallow. Cut your food into small pieces and eat slowly. Take small bites. Eat and drink only when you are  sitting upright. Do not drink alcohol or caffeine. If you need help quitting, ask your health care provider. General instructions Check your weight every day to make sure you are not losing weight. Do not use any products that contain nicotine or tobacco.  These products include cigarettes, chewing tobacco, and vaping devices, such as e-cigarettes. If you need help quitting, ask your health care provider. Keep all follow-up visits. This is important. Contact a health care provider if: You lose weight because you cannot swallow. You cough when you drink liquids. You cough up partially digested food. Get help right away if: You cannot swallow your saliva. You have shortness of breath, a fever, or both. Your voice is hoarse and you have trouble swallowing. These symptoms may represent a serious problem that is an emergency. Do not wait to see if the symptoms will go away. Get medical help right away. Call your local emergency services (911 in the [place] Do not drive yourself to the hospital. Summary Dysphagia is trouble swallowing. This condition occurs when solids and liquids stick in a person's throat on the way down to the stomach. You may cough or gag while trying to swallow. Dysphagia has many possible causes. Treatment for dysphagia depends on the cause of the condition. Keep all follow-up visits. This is important. This information is not intended to replace advice given to you by your health care provider. Make sure you discuss any questions you have with your health care provider. Document Revised: 12/06/2019 Document Reviewed: 12/06/2019 Elsevier Patient Education  [place]. Generalized Anxiety Disorder, Adult Generalized anxiety disorder (GAD) is a mental health condition. Unlike normal worries, anxiety related to GAD is not triggered by a specific event. These worries do not fade or get better with time. GAD interferes with relationships, work, and school. GAD symptoms can vary from mild to severe. People with severe GAD can have intense waves of anxiety with physical symptoms that are similar to panic attacks. What are the causes? The exact cause of GAD is not known, but the following are believed to have an  impact: Differences in natural brain chemicals. Genes passed down from parents to children. Differences in the way threats are perceived. Development and stress during childhood. Personality. What increases the risk? The following factors may make you more likely to develop this condition: Being female. Having a family history of anxiety disorders. Being very shy. Experiencing very stressful life events, such as the death of a loved one. Having a very stressful family environment. What are the signs or symptoms? People with GAD often worry excessively about many things in their lives, such as their health and family. Symptoms may also include: Mental and emotional symptoms: Worrying excessively about natural disasters. Fear of being late. Difficulty concentrating. Fears that others are judging your performance. Physical symptoms: Fatigue. Headaches, muscle tension, muscle twitches, trembling, or feeling shaky. Feeling like your heart is pounding or beating very fast. Feeling out of breath or like you cannot take a deep breath. Having trouble falling asleep or staying asleep, or experiencing restlessness. Sweating. Nausea, diarrhea, or irritable bowel syndrome (IBS). Behavioral symptoms: Experiencing erratic moods or irritability. Avoidance of new situations. Avoidance of people. Extreme difficulty making decisions. How is this diagnosed? This condition is diagnosed based on your symptoms and medical history. You will also have a physical exam. Your health care provider may perform tests to rule out other possible causes of your symptoms. To be diagnosed with GAD, a person must have anxiety that:  Is out of his or her control. Affects several different aspects of his or her life, such as work and relationships. Causes distress that makes him or her unable to take part in normal activities. Includes at least three symptoms of GAD, such as restlessness, fatigue, trouble  concentrating, irritability, muscle tension, or sleep problems. Before your health care provider can confirm a diagnosis of GAD, these symptoms must be present more days than they are not, and they must last for 6 months or longer. How is this treated? This condition may be treated with: Medicine. Antidepressant medicine is usually prescribed for long-term daily control. Anti-anxiety medicines may be added in severe cases, especially when panic attacks occur. Talk therapy (psychotherapy). Certain types of talk therapy can be helpful in treating GAD by providing support, education, and guidance. Options include: Cognitive behavioral therapy (CBT). People learn coping skills and self-calming techniques to ease their physical symptoms. They learn to identify unrealistic thoughts and behaviors and to replace them with more appropriate thoughts and behaviors. Acceptance and commitment therapy (ACT). This treatment teaches people how to be mindful as a way to cope with unwanted thoughts and feelings. Biofeedback. This process trains you to manage your body's response (physiological response) through breathing techniques and relaxation methods. You will work with a therapist while machines are used to monitor your physical symptoms. Stress management techniques. These include yoga, meditation, and exercise. A mental health specialist can help determine which treatment is best for you. Some people see improvement with one type of therapy. However, other people require a combination of therapies. Follow these instructions at home: Lifestyle Maintain a consistent routine and schedule. Anticipate stressful situations. Create a plan and allow extra time to work with your plan. Practice stress management or self-calming techniques that you have learned from your therapist or your health care provider. Exercise regularly and spend time outdoors. Eat a healthy diet that includes plenty of vegetables, fruits, whole  grains, low-fat dairy products, and lean protein. Do not eat a lot of foods that are high in fat, added sugar, or salt (sodium). Drink plenty of water. Avoid alcohol. Alcohol can increase anxiety. Avoid caffeine and certain over-the-counter cold medicines. These may make you feel worse. Ask your pharmacist which medicines to avoid. General instructions Take over-the-counter and prescription medicines only as told by your health care provider. Understand that you are likely to have setbacks. Accept this and be kind to yourself as you persist to take better care of yourself. Anticipate stressful situations. Create a plan and allow extra time to work with your plan. Recognize and accept your accomplishments, even if you judge them as small. Spend time with people who care about you. Keep all follow-up visits. This is important. Where to find more information [place]: https://carter.com/ Substance Abuse and Mental Health Services: ktimeonline.com Contact a health care provider if: Your symptoms do not get better. Your symptoms get worse. You have signs of depression, such as: A persistently sad or irritable mood. Loss of enjoyment in activities that used to bring you joy. Change in weight or eating. Changes in sleeping habits. Get help right away if: You have thoughts about hurting yourself or others. If you ever feel like you may hurt yourself or others, or have thoughts about taking your own life, get help right away. Go to your nearest emergency department or: Call your local emergency services (911 in the [place] Call a suicide crisis helpline, such as the [place] at  (215)225-9682 or 988 in the [place] This is open 24 hours a day in the [place] Text the Crisis Text Line at (762) 716-5629 (in the [place].). Summary Generalized anxiety disorder (GAD) is a mental health condition that involves worry that is not triggered by a specific event. People with  GAD often worry excessively about many things in their lives, such as their health and family. GAD may cause symptoms such as restlessness, trouble concentrating, sleep problems, frequent sweating, nausea, diarrhea, headaches, and trembling or muscle twitching. A mental health specialist can help determine which treatment is best for you. Some people see improvement with one type of therapy. However, other people require a combination of therapies. This information is not intended to replace advice given to you by your health care provider. Make sure you discuss any questions you have with your health care provider. Document Revised: 11/10/2020 Document Reviewed: 08/08/2020 Elsevier Patient Education  [place].
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
## 2021-03-31 ENCOUNTER — Encounter (INDEPENDENT_AMBULATORY_CARE_PROVIDER_SITE_OTHER): Payer: Self-pay | Admitting: *Deleted

## 2021-04-01 ENCOUNTER — Telehealth: Payer: Self-pay | Admitting: Urology

## 2021-04-01 NOTE — Telephone Encounter (Signed)
[facility] F[facility] has denied  [id number] - CHG MRI, PELVIS, COMBO Do you want to set up a peer to peer? Please advise? Thanks

## 2021-04-05 ENCOUNTER — Encounter: Payer: Self-pay | Admitting: Urology

## 2021-04-05 NOTE — Telephone Encounter (Signed)
Amber Williams has denied our request for an appeal for the MRI pelvis.    How would you like to proceed?

## 2021-04-05 NOTE — Telephone Encounter (Signed)
Update:  We have received an APPROVAL to our appeal for the MRI pelvis.   Sending to scheduling now to contact patient to schedule appt.

## 2021-04-05 NOTE — Patient Instructions (Signed)
Sacral Nerve Stimulator Implantation, Care After The following information offers guidance on how to care for yourself after your procedure. Your health care provider may also give you more specific instructions. If you have problems or questions, contact your health care provider. What can I expect after the procedure? After the procedure, it is common to have soreness or pain in the incision area. Follow these instructions at home: Medicines Take over-the-counter and prescription medicines only as told by your health care provider. If you were prescribed an antibiotic medicine, take it as told by your health care provider. Do not stop using the antibiotic even if you start to feel better. Ask your health care provider if the medicine prescribed to you requires you to avoid driving or using machinery. Incision care   Follow instructions from your health care provider about how to take care of your incisions. Make sure you: Wash your hands with soap and water for at least 20 seconds before and after you change your bandage (dressing). If soap and water are not available, use hand sanitizer. Change your dressing as told by your health care provider. Leave stitches (sutures), staples, skin glue, or adhesive strips in place. These skin closures may need to stay in place for 2 weeks or longer. If adhesive strip edges start to loosen and curl up, you may trim the loose edges. Do not remove adhesive strips completely unless your health care provider tells you to do that. Check your incision area every day for signs of infection. Check for: Redness, swelling, or more pain. Fluid or blood. Warmth. Pus or a bad smell. Activity Follow instructions from your health care provider about any activity restrictions. You may need to avoid: Bending, twisting, or stretching. Having sex. Do not lift anything that is heavier than 10 lb (4.5 kg), or the limit that you are told, until your health care provider says  that it is safe. Return to your normal activities as told by your health care provider. Ask your health care provider what activities are safe for you. Bathing Do not take baths, swim, or use a hot tub until your health care provider approves. Ask your health care provider if you may take showers. You may only be allowed to take sponge baths. Keep the dressing dry until your health care provider says it can be removed. Using the stimulator You will be given a remote control device. This device will allow you to turn your sacral nerve stimulator on and off. Follow instructions from your health care provider about how to use this device. Tell all of your health care providers that you have this device. Remind them that you have the device before they do any tests or procedures. General instructions If you were given a sedative during the procedure, it can affect you for several hours. Do not drive or operate machinery until your health care provider says that it is safe. Keep all follow-up visits. This is important. Your health care provider may need to adjust the stimulator over several visits until it works well for you. Contact a health care provider if: Your device stops working. The device is not helping your symptoms. You have a fever or chills. You have pus or a bad smell coming from an incision. You have redness, swelling, or more pain around an incision. You have fluid or blood coming from an incision. An incision feels warm to the touch. Summary After the procedure, it is common to have soreness or pain in the  incision area. Follow instructions from your health care provider about how to take care of your incision. Also, follow instructions about any activity restrictions. You may need to avoid activities that involve a lot of bending, twisting, or stretching. Tell all of your health care providers that you have this device. Remind them that you have the device before they do any tests  or procedures. Contact your health care provider if your device stops working, or if it is not helping to treat your symptoms. Contact your health care provider if you have a fever, or if there is redness, warmth, swelling, pain, pus, or a bad smell in or around an incision. This information is not intended to replace advice given to you by your health care provider. Make sure you discuss any questions you have with your health care provider. Document Revised: 11/21/2019 Document Reviewed: 11/21/2019 Elsevier Patient Education  [place].

## 2021-04-06 ENCOUNTER — Ambulatory Visit: Payer: Medicare HMO | Admitting: Family Medicine

## 2021-04-13 ENCOUNTER — Other Ambulatory Visit: Payer: Self-pay

## 2021-04-13 ENCOUNTER — Ambulatory Visit (HOSPITAL_COMMUNITY)
Admission: RE | Admit: 2021-04-13 | Discharge: 2021-04-13 | Disposition: A | Discharge status: Home / Self Care | Payer: Medicare HMO | Source: Ambulatory Visit | Attending: Urology | Admitting: Urology

## 2021-04-13 DIAGNOSIS — M869 Osteomyelitis, unspecified: Secondary | ICD-10-CM | POA: Insufficient documentation

## 2021-04-13 DIAGNOSIS — M160 Bilateral primary osteoarthritis of hip: Secondary | ICD-10-CM | POA: Diagnosis not present

## 2021-04-13 DIAGNOSIS — M533 Sacrococcygeal disorders, not elsewhere classified: Secondary | ICD-10-CM | POA: Diagnosis not present

## 2021-04-13 DIAGNOSIS — S31010A Laceration without foreign body of lower back and pelvis without penetration into retroperitoneum, initial encounter: Secondary | ICD-10-CM | POA: Diagnosis not present

## 2021-04-13 MED ORDER — GADOBUTROL 1 MMOL/ML IV SOLN
8.0000 mL | Freq: Once | INTRAVENOUS | Status: AC | PRN
  Administered 2021-04-13: 12:00:00 8 mL via INTRAVENOUS

## 2021-04-15 ENCOUNTER — Encounter: Payer: Self-pay | Admitting: Urology

## 2021-04-15 NOTE — Telephone Encounter (Signed)
Please review MRI results.

## 2021-04-18 ENCOUNTER — Ambulatory Visit (INDEPENDENT_AMBULATORY_CARE_PROVIDER_SITE_OTHER): Payer: Medicare HMO | Admitting: Family Medicine

## 2021-04-18 ENCOUNTER — Encounter: Payer: Self-pay | Admitting: Family Medicine

## 2021-04-18 VITALS — BP 121/82 | HR 125 | Temp 98.2°F | Ht 65.0 in | Wt 188.8 lb

## 2021-04-18 DIAGNOSIS — Z13220 Encounter for screening for lipoid disorders: Secondary | ICD-10-CM | POA: Diagnosis not present

## 2021-04-18 DIAGNOSIS — E039 Hypothyroidism, unspecified: Secondary | ICD-10-CM

## 2021-04-18 MED ORDER — CEVIMELINE HCL 30 MG PO CAPS: 30.0000 mg | ORAL_CAPSULE | Freq: Three times a day (TID) | ORAL | 0 refills | Status: DC

## 2021-04-18 MED ORDER — CEVIMELINE HCL 30 MG PO CAPS: 30.0000 mg | ORAL_CAPSULE | Freq: Three times a day (TID) | ORAL | 3 refills | Status: DC

## 2021-04-18 MED ORDER — PAROXETINE HCL 20 MG PO TABS: 20.0000 mg | ORAL_TABLET | Freq: Every day | ORAL | 3 refills | Status: DC

## 2021-04-18 MED ORDER — OMEPRAZOLE 40 MG PO CPDR: 40.0000 mg | DELAYED_RELEASE_CAPSULE | Freq: Every day | ORAL | 3 refills | Status: DC

## 2021-04-18 NOTE — Progress Notes (Signed)
Subjective:  Patient ID: Amber Williams, female    DOB: 04/29/1976  Age: 45 y.o. MRN: [id number]  CC: Medical Management of Chronic Issues   HPI Amber Williams presents for indigestion, dry mouth and high anxiety. Seroquel increase didn't help. Still feeling the symptoms of anxiety as noted below.   Indigestion is bad every night. No better with gaviscon. Taking prilosec on an empty stomach.  Depression screen [facility] 2/9 04/18/2021 04/18/2021 03/30/2021  Decreased Interest 0 0 0  Down, Depressed, Hopeless 1 0 0  PHQ - 2 Score 1 0 0  Altered sleeping 3 - 0  Tired, decreased energy 0 - 0  Change in appetite 1 - 0  Feeling bad or failure about yourself  1 - 0  Trouble concentrating 0 - 0  Moving slowly or fidgety/restless 0 - 0  Suicidal thoughts 0 - 0  PHQ-9 Score 6 - 0  Difficult doing work/chores Somewhat difficult - -  Some recent data might be hidden   GAD 7 : Generalized Anxiety Score 04/18/2021 03/30/2021 01/17/2021 12/03/2020  Nervous, Anxious, on Edge 3 0 1 1  Control/stop worrying 3 0 0 0  Worry too much - different things 3 0 0 0  Trouble relaxing 3 0 0 0  Restless 1 0 0 0  Easily annoyed or irritable 3 0 1 0  Afraid - awful might happen 1 0 0 0  Total GAD 7 Score 17 0 2 1  Anxiety Difficulty Extremely difficult - - Not difficult at all     History Amber Williams has a past medical history of Anal cancer ([place]) (01/10/2019), Arthritis, Depression, DVT (deep venous thrombosis) ([place]) (2013), Essential hypertension, Family history of breast cancer, Gait abnormality (08/03/2016), GERD (gastroesophageal reflux disease) (08/03/2020), History of blood transfusion (2013), Hyperlipidemia, Hypothyroidism, Hypothyroidism, Multiple sclerosis ([place]), Neurogenic bladder (08/03/2016), Port-A-Cath in place (01/23/2019), PTSD (post-traumatic stress disorder), PTSD (post-traumatic stress disorder), Right foot drop (08/03/2016), Seasonal allergies, and Ulcer.   She has a past surgical history that  includes Colon surgery; Gastric bypass (2010); Interstim Implant placement (2014); Portacath placement; Hemorrhoid surgery (N/A, 12/25/2018); Flexible sigmoidoscopy (N/A, 06/25/2019); Colonoscopy with propofol (N/A, 03/04/2020); Colonoscopy (N/A, 07/06/2020); and Interstim Implant placement (N/A, 12/27/2020).   Her family history includes Breast cancer (age of onset: 66) in her sister; Breast cancer (age of onset: 48) in her mother; Cancer in her paternal grandmother; Diabetes in her father, maternal grandmother, and paternal grandmother; Heart disease in her maternal grandfather, maternal grandmother, and paternal grandfather; Hypertension in her father and sister; Miscarriages / Stillbirths in her sister; Stroke in her paternal grandfather.She reports that she quit smoking about 12 years ago. Her smoking use included cigarettes. She started smoking about 29 years ago. She has a 22.50 pack-year smoking history. She has never used smokeless tobacco. She reports that she does not currently use alcohol. She reports that she does not use drugs.    ROS Review of Systems  Constitutional: Negative.   HENT: Negative.    Eyes:  Negative for visual disturbance.  Respiratory:  Negative for shortness of breath.   Cardiovascular:  Negative for chest pain.  Gastrointestinal:  Negative for abdominal pain.  Musculoskeletal:  Positive for arthralgias.   Objective:  BP 121/82    Pulse (!) 125    Temp 98.2 F (36.8 C)    Ht 5' 5"  (1.651 m)    Wt 188 lb 12.8 oz (85.6 kg)    SpO2 99%    BMI 31.42 kg/m  BP Readings from Last 3 Encounters:  04/18/21 121/82  03/30/21 138/78  03/28/21 116/76    Wt Readings from Last 3 Encounters:  04/18/21 188 lb 12.8 oz (85.6 kg)  03/30/21 179 lb (81.2 kg)  01/22/21 189 lb 3.2 oz (85.8 kg)     Physical Exam Constitutional:      General: She is not in acute distress.    Appearance: She is well-developed.  Cardiovascular:     Rate and Rhythm: Normal rate and regular rhythm.   Pulmonary:     Breath sounds: Normal breath sounds.  Musculoskeletal:        General: Normal range of motion.  Skin:    General: Skin is warm and dry.  Neurological:     Mental Status: She is alert and oriented to person, place, and time.      Assessment & Plan:   Amber Williams was seen today for medical management of chronic issues.  Diagnoses and all orders for this visit:  Acquired hypothyroidism -     CBC with Differential/Platelet -     CMP14+EGFR -     TSH + free T4  Screening, lipid -     CBC with Differential/Platelet -     CMP14+EGFR -     Lipid panel  Other orders -     omeprazole (PRILOSEC) 40 MG capsule; Take 1 capsule (40 mg total) by mouth daily. -     Discontinue: cevimeline (EVOXAC) 30 MG capsule; Take 1 capsule (30 mg total) by mouth 3 (three) times daily. -     Discontinue: PARoxetine (PAXIL) 20 MG tablet; Take 1 tablet (20 mg total) by mouth daily. -     PARoxetine (PAXIL) 20 MG tablet; Take 1 tablet (20 mg total) by mouth daily. -     cevimeline (EVOXAC) 30 MG capsule; Take 1 capsule (30 mg total) by mouth 3 (three) times daily.       I have changed Amber Williams's omeprazole. [name] Vitamin D3, Cyanocobalamin, Allergy Relief, aspirin, multivitamin with minerals, furosemide, levothyroxine, Vitamin A, meloxicam, traMADol, oxyCODONE-acetaminophen, traZODone, QUEtiapine, PARoxetine, and cevimeline.  Allergies as of 04/18/2021       Reactions   Tecfidera [dimethyl Fumarate] Itching        Medication List        Accurate as of April 18, 2021  7:07 PM. If you have any questions, ask your nurse or doctor.          Allergy Relief 180 MG tablet Generic drug: fexofenadine Take 180 mg by mouth daily as needed for allergies.   aspirin 81 MG EC tablet Take 81 mg by mouth at bedtime.   cevimeline 30 MG capsule Commonly known as: EVOXAC Take 1 capsule (30 mg total) by mouth 3 (three) times daily. Started by:  [name], MD   Cyanocobalamin 5000 MCG Tbdp Take 5,000 mcg by mouth daily.   furosemide 20 MG tablet Commonly known as: LASIX Take 20 mg by mouth daily as needed for edema.   levothyroxine 112 MCG tablet Commonly known as: SYNTHROID Take 1 tablet (112 mcg total) by mouth daily before breakfast.   meloxicam 7.5 MG tablet Commonly known as: Mobic Take 1 tablet (7.5 mg total) by mouth daily.   multivitamin with minerals tablet Take 1 tablet by mouth daily. One a day woman   omeprazole 40 MG capsule Commonly known as: PRILOSEC Take 1 capsule (40 mg total) by mouth daily. What changed:  medication strength  how much to take Changed by: [name], MD   oxyCODONE-acetaminophen 5-325 MG tablet Commonly known as: Percocet Take 1 tablet by mouth every 4 (four) hours as needed.   PARoxetine 20 MG tablet Commonly known as: Paxil Take 1 tablet (20 mg total) by mouth daily. Started by: [name], MD   QUEtiapine 50 MG tablet Commonly known as: SEROquel Take 1 tablet (50 mg total) by mouth at bedtime.   traMADol 50 MG tablet Commonly known as: Ultram Take 1 tablet (50 mg total) by mouth every 6 (six) hours as needed.   traZODone 150 MG tablet Commonly known as: DESYREL TAKE FROM 1/3 TO 1 TABLET  AS NEEDED FOR SLEEP   Vitamin A 2400 MCG (8000 UT) Caps Take 8,000 Units by mouth daily.   Vitamin D3 125 MCG (5000 UT) Caps Take 5,000 Units by mouth every morning.         Follow-up: Return in about 6 weeks (around 05/30/2021).  [name], M.D.

## 2021-04-19 ENCOUNTER — Encounter (HOSPITAL_COMMUNITY): Payer: Self-pay | Admitting: Hematology

## 2021-04-19 LAB — CMP14+EGFR
ALT: 21 IU/L (ref 0–32)
AST: 16 IU/L (ref 0–40)
Albumin/Globulin Ratio: 4.3 — ABNORMAL HIGH (ref 1.2–2.2)
Albumin: 3.9 g/dL (ref 3.8–4.8)
Alkaline Phosphatase: 113 IU/L (ref 44–121)
BUN/Creatinine Ratio: 11 (ref 9–23)
BUN: 12 mg/dL (ref 6–24)
Bilirubin Total: <0.2 mg/dL (ref 0.0–1.2)
CO2: 24 mmol/L (ref 20–29)
Calcium: 9.8 mg/dL (ref 8.7–10.2)
Chloride: 107 mmol/L — ABNORMAL HIGH (ref 96–106)
Creatinine, Ser: 1.08 mg/dL — ABNORMAL HIGH (ref 0.57–1.00)
Globulin, Total: 0.9 g/dL — ABNORMAL LOW (ref 1.5–4.5)
Glucose: 89 mg/dL (ref 70–99)
Potassium: 3.7 mmol/L (ref 3.5–5.2)
Sodium: 145 mmol/L — ABNORMAL HIGH (ref 134–144)
Total Protein: 4.8 g/dL — ABNORMAL LOW (ref 6.0–8.5)
eGFR: 65 mL/min/{1.73_m2} (ref >59)

## 2021-04-19 LAB — CBC WITH DIFFERENTIAL/PLATELET
Basophils Absolute: 0 10*3/uL (ref 0.0–0.2)
Basos: 0 %
EOS (ABSOLUTE): 0.1 10*3/uL (ref 0.0–0.4)
Eos: 1 %
Hematocrit: 39.1 % (ref 34.0–46.6)
Hemoglobin: 13.2 g/dL (ref 11.1–15.9)
Immature Grans (Abs): 0 10*3/uL (ref 0.0–0.1)
Immature Granulocytes: 0 %
Lymphocytes Absolute: 0.7 10*3/uL (ref 0.7–3.1)
Lymphs: 7 %
MCH: 32.3 pg (ref 26.6–33.0)
MCHC: 33.8 g/dL (ref 31.5–35.7)
MCV: 96 fL (ref 79–97)
Monocytes Absolute: 0.9 10*3/uL (ref 0.1–0.9)
Monocytes: 9 %
Neutrophils Absolute: 8.2 10*3/uL — ABNORMAL HIGH (ref 1.4–7.0)
Neutrophils: 83 %
Platelets: 287 10*3/uL (ref 150–450)
RBC: 4.09 x10E6/uL (ref 3.77–5.28)
RDW: 12.9 % (ref 11.7–15.4)
WBC: 9.9 10*3/uL (ref 3.4–10.8)

## 2021-04-19 LAB — LIPID PANEL
Chol/HDL Ratio: 2.6 ratio (ref 0.0–4.4)
Cholesterol, Total: 166 mg/dL (ref 100–199)
HDL: 65 mg/dL (ref >39)
LDL Chol Calc (NIH): 79 mg/dL (ref 0–99)
Triglycerides: 126 mg/dL (ref 0–149)
VLDL Cholesterol Cal: 22 mg/dL (ref 5–40)

## 2021-04-19 LAB — TSH+FREE T4
Free T4: 1.57 ng/dL (ref 0.82–1.77)
TSH: 0.156 u[IU]/mL — ABNORMAL LOW (ref 0.450–4.500)

## 2021-04-20 NOTE — Progress Notes (Signed)
Hello Fronnie,  Your lab result is normal and/or stable.Some minor variations that are not significant are commonly marked abnormal, but do not represent any medical problem for you.  Best regards, [name], M.D.

## 2021-04-21 ENCOUNTER — Other Ambulatory Visit: Payer: Medicare HMO

## 2021-04-21 ENCOUNTER — Other Ambulatory Visit: Payer: Self-pay

## 2021-04-21 ENCOUNTER — Telehealth: Payer: Self-pay

## 2021-04-21 ENCOUNTER — Encounter: Payer: Self-pay | Admitting: Urology

## 2021-04-21 DIAGNOSIS — N3000 Acute cystitis without hematuria: Secondary | ICD-10-CM | POA: Diagnosis not present

## 2021-04-21 DIAGNOSIS — G35 Multiple sclerosis: Secondary | ICD-10-CM | POA: Diagnosis not present

## 2021-04-21 MED ORDER — DOXYCYCLINE HYCLATE 100 MG PO CAPS: 100.0000 mg | ORAL_CAPSULE | Freq: Two times a day (BID) | ORAL | 0 refills | Status: DC

## 2021-04-21 NOTE — Telephone Encounter (Signed)
Patient called complaining of UTI symptoms. Urine sent for culture. Urine dip sent to Dr. Alyson Ingles.  Patient started on Doxycyline. Rx sent, patient called and made aware.

## 2021-04-22 LAB — URINALYSIS, ROUTINE W REFLEX MICROSCOPIC
Bilirubin, UA: NEGATIVE
Glucose, UA: NEGATIVE
Ketones, UA: NEGATIVE
Nitrite, UA: NEGATIVE
Protein,UA: NEGATIVE
Specific Gravity, UA: 1.02 (ref 1.005–1.030)
Urobilinogen, Ur: 2 mg/dL — ABNORMAL HIGH (ref 0.2–1.0)
pH, UA: 7.5 (ref 5.0–7.5)

## 2021-04-24 LAB — URINE CULTURE

## 2021-04-26 ENCOUNTER — Encounter: Payer: Self-pay | Admitting: Urology

## 2021-04-26 NOTE — Telephone Encounter (Signed)
Please review MRI- results are not released

## 2021-04-27 NOTE — Progress Notes (Signed)
MRI shows no inflammation from the device or leads. We will proceed with removal of the device

## 2021-04-28 ENCOUNTER — Other Ambulatory Visit: Payer: Self-pay | Admitting: Family Medicine

## 2021-05-03 ENCOUNTER — Other Ambulatory Visit: Payer: Self-pay | Admitting: Urology

## 2021-05-03 DIAGNOSIS — R32 Unspecified urinary incontinence: Secondary | ICD-10-CM

## 2021-05-03 NOTE — Progress Notes (Signed)
Surgical Physician Order Form [facility] [place]  * Scheduling expectation : ASAP  *Length of Case: 60 minutes  *MD Preforming Case: Nicolette Bang, MD  *Assistant Needed: no  *Facility Preference: [facility]  *Clearance needed: no  *Anticoagulation Instructions: Hold all anticoagulants  *Aspirin Instructions: Ok to continue Aspirin  -Admit type: OUTpatient  -Anesthesia: General  -Use Standing Orders:  NA  *Diagnosis:  malfunctioning interstim  *Procedure:  Removal on interstim leads and generator    Additional orders: N/A  -Equipment: C-Arm -VTE Prophylaxis Standing Order SCDs       Other:   -Standing Lab Orders Per Anesthesia    Lab other: None  -Standing Test orders EKG/Chest x-ray per Anesthesia       Test other:   - Medications:  Ancef 2gm IV  -Other orders:  PAS  *Post-op visit Date/Instructions:  1-2 week follow up

## 2021-05-04 ENCOUNTER — Encounter (HOSPITAL_COMMUNITY): Payer: Self-pay | Admitting: Hematology

## 2021-05-04 ENCOUNTER — Telehealth: Payer: Self-pay

## 2021-05-04 ENCOUNTER — Encounter: Payer: Self-pay | Admitting: Urology

## 2021-05-04 DIAGNOSIS — G35 Multiple sclerosis: Secondary | ICD-10-CM | POA: Diagnosis not present

## 2021-05-04 DIAGNOSIS — Z7901 Long term (current) use of anticoagulants: Secondary | ICD-10-CM | POA: Diagnosis not present

## 2021-05-04 DIAGNOSIS — M4316 Spondylolisthesis, lumbar region: Secondary | ICD-10-CM | POA: Diagnosis not present

## 2021-05-04 DIAGNOSIS — K5900 Constipation, unspecified: Secondary | ICD-10-CM | POA: Diagnosis not present

## 2021-05-04 DIAGNOSIS — Z86718 Personal history of other venous thrombosis and embolism: Secondary | ICD-10-CM | POA: Diagnosis not present

## 2021-05-04 DIAGNOSIS — M79605 Pain in left leg: Secondary | ICD-10-CM | POA: Diagnosis not present

## 2021-05-04 DIAGNOSIS — Q8909 Congenital malformations of spleen: Secondary | ICD-10-CM | POA: Diagnosis not present

## 2021-05-04 DIAGNOSIS — I82413 Acute embolism and thrombosis of femoral vein, bilateral: Secondary | ICD-10-CM | POA: Diagnosis not present

## 2021-05-04 DIAGNOSIS — M79604 Pain in right leg: Secondary | ICD-10-CM | POA: Diagnosis not present

## 2021-05-04 DIAGNOSIS — E876 Hypokalemia: Secondary | ICD-10-CM | POA: Diagnosis not present

## 2021-05-04 DIAGNOSIS — I82402 Acute embolism and thrombosis of unspecified deep veins of left lower extremity: Secondary | ICD-10-CM | POA: Diagnosis not present

## 2021-05-04 DIAGNOSIS — Z7982 Long term (current) use of aspirin: Secondary | ICD-10-CM | POA: Diagnosis not present

## 2021-05-04 NOTE — Progress Notes (Signed)
Called patient, LM for patient to return my call.

## 2021-05-04 NOTE — Progress Notes (Signed)
°  Thank you for choosing [facility] to assist in your urologic care for your upcoming surgery. The following information below includes specific dates and details related to surgery:  The Surgical Procedure you are scheduled to have performed is a Removal of Interstim including Generator and Leads  Surgery Date: 05/12/2021   Physician performing the surgery: Dr. Nicolette Bang, MD  Do not eat or drink after midnight the day before your surgery.   You will need a driver the day of surgery and will not be able to operate heavy machinery for 24 hours after.   Your surgery will be performed at  [facility] [address],  [address]   Enter at the Main Entrance and check in at the [place] desk.     Pre-Admit Testing Info   Pre- Admit appointments are interview with an anesthesiologist or a pre-operative anesthesia nurse. These appointments are typically completed as an in person visit but can take place over the telephone.  You will be contacted to confirm the date and time window.    If you have any questions or concerns, please don't hesitate to call the office at [phone] option 3 for surgery.   Thank you,   Gerald Leitz, [place] Deborra Medina) Clinical Surgery Coordinator [facility]

## 2021-05-04 NOTE — Progress Notes (Signed)
[facility] Posting Form   Surgery Date/Time: Date: 05/12/2021  Surgeon: Dr. Nicolette Bang, MD  Surgery Location: Day Surgery  Inpt ( No  )   Outpt (Yes)   Obs ( No  )   Diagnosis: Urinary Incontinence R32, Malfunctioning Interstim   -CPT: 59470,76151  Surgery: Removal of Interstim Generator and Leads  Stop Anticoagulations: Yes  Cardiac/Medical/Pulmonary Clearance needed: no  *Orders entered into EPIC  Date: 05/04/21   *Case booked in EPIC  Date: 05/04/21  *Notified pt of Surgery: Date: 05/04/21  *Placed into Prior Authorization Work Fabio Bering Date: 05/04/21   Assistant/laser/rep:Yes Interstim Rep Colletta Maryland has been contacted and will be present.

## 2021-05-04 NOTE — Telephone Encounter (Signed)
I spoke with Amber Williams. We have discussed possible surgery dates and January 12th ,2023 was agreed upon by all parties. Patient given information about surgery date, what to expect pre-operatively and post operatively. We discussed that a pre-op nurse will be calling to set up the pre-op visit that will take place prior to surgery. Informed patient that our office will communicate any additional care to be provided after surgery. Patients questions or concerns were discussed during our call. Advised to call our office should there be any additional information, questions or concerns that arise. Patient verbalized understanding.

## 2021-05-05 ENCOUNTER — Telehealth: Payer: Self-pay

## 2021-05-05 NOTE — Telephone Encounter (Signed)
Received my chart message this am patient recently seen for bilateral DVT and started on blood thinner.   Reviewed with Dr. Alyson Ingles- surgery will be cancelled at this time.   Message sent to schedulers.

## 2021-05-05 NOTE — Telephone Encounter (Signed)
Please advise 

## 2021-05-09 ENCOUNTER — Encounter (HOSPITAL_COMMUNITY): Payer: Self-pay | Admitting: Hematology

## 2021-05-09 ENCOUNTER — Inpatient Hospital Stay (HOSPITAL_COMMUNITY): Payer: Medicare Other | Attending: Hematology | Admitting: Hematology

## 2021-05-09 ENCOUNTER — Other Ambulatory Visit: Payer: Self-pay

## 2021-05-09 DIAGNOSIS — I82403 Acute embolism and thrombosis of unspecified deep veins of lower extremity, bilateral: Secondary | ICD-10-CM | POA: Insufficient documentation

## 2021-05-09 DIAGNOSIS — Z806 Family history of leukemia: Secondary | ICD-10-CM | POA: Diagnosis not present

## 2021-05-09 DIAGNOSIS — G35 Multiple sclerosis: Secondary | ICD-10-CM | POA: Diagnosis not present

## 2021-05-09 DIAGNOSIS — Z7901 Long term (current) use of anticoagulants: Secondary | ICD-10-CM | POA: Insufficient documentation

## 2021-05-09 DIAGNOSIS — C210 Malignant neoplasm of anus, unspecified: Secondary | ICD-10-CM | POA: Diagnosis not present

## 2021-05-09 DIAGNOSIS — Z803 Family history of malignant neoplasm of breast: Secondary | ICD-10-CM | POA: Insufficient documentation

## 2021-05-09 DIAGNOSIS — Z87891 Personal history of nicotine dependence: Secondary | ICD-10-CM | POA: Diagnosis not present

## 2021-05-09 DIAGNOSIS — K219 Gastro-esophageal reflux disease without esophagitis: Secondary | ICD-10-CM | POA: Diagnosis not present

## 2021-05-09 DIAGNOSIS — I82409 Acute embolism and thrombosis of unspecified deep veins of unspecified lower extremity: Secondary | ICD-10-CM | POA: Insufficient documentation

## 2021-05-09 MED ORDER — APIXABAN 5 MG PO TABS: 5.0000 mg | ORAL_TABLET | Freq: Two times a day (BID) | ORAL | 6 refills | Status: DC

## 2021-05-09 NOTE — Patient Instructions (Addendum)
[place] at [facility] Discharge Instructions   You were seen and examined today by Dr. Delton Coombes.  We will send a prescription to your pharmacy called Eliquis - this is help to prevent blood clots from forming in your veins. You will take this pill twice a day.  This can cause increased risk of bleeding.  You should not have any procedures within the first 3 months of taking. You can stop Lovenox injections once you start these pills.  Return as scheduled for lab work and office visit. Please call the clinic prior to your next appointment should you have any questions or concerns.        Thank you for choosing [place] at [facility] to provide your oncology and hematology care.  To afford each patient quality time with our provider, please arrive at least 15 minutes before your scheduled appointment time.   If you have a lab appointment with the [facility] please come in thru the Main Entrance and check in at the main information desk.  You need to re-schedule your appointment should you arrive 10 or more minutes late.  We strive to give you quality time with our providers, and arriving late affects you and other patients whose appointments are after yours.  Also, if you no show three or more times for appointments you may be dismissed from the clinic at the providers discretion.     Again, thank you for choosing [facility].  Our hope is that these requests will decrease the amount of time that you wait before being seen by our physicians.       _____________________________________________________________  Should you have questions after your visit to [facility], please contact our office at [phone] and follow the prompts.  Our office hours are 8:00 a.m. and 4:30 p.m. Monday - Friday.  Please note that voicemails left after 4:00 p.m. may not be returned until the following business day.  We are closed  weekends and major holidays.  You do have access to a nurse 24-7, just call the main number to the clinic [phone] and do not press any options, hold on the line and a nurse will answer the phone.    For prescription refill requests, have your pharmacy contact our office and allow 72 hours.    Due to Covid, you will need to wear a mask upon entering the hospital. If you do not have a mask, a mask will be given to you at the Main Entrance upon arrival. For doctor visits, patients may have 1 support person age 36 or older with them. For treatment visits, patients can not have anyone with them due to social distancing guidelines and our immunocompromised population.
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
## 2021-05-10 ENCOUNTER — Encounter (HOSPITAL_COMMUNITY): Payer: Medicare Other

## 2021-05-12 ENCOUNTER — Ambulatory Visit: Admit: 2021-05-12 | Payer: Medicare Other | Admitting: Urology

## 2021-05-12 SURGERY — REMOVAL, NEUROSTIMULATOR, SACRAL
Anesthesia: General

## 2021-05-17 DIAGNOSIS — R5383 Other fatigue: Secondary | ICD-10-CM | POA: Diagnosis not present

## 2021-05-17 DIAGNOSIS — G35 Multiple sclerosis: Secondary | ICD-10-CM | POA: Diagnosis not present

## 2021-05-17 DIAGNOSIS — G4700 Insomnia, unspecified: Secondary | ICD-10-CM | POA: Diagnosis not present

## 2021-05-17 DIAGNOSIS — M21371 Foot drop, right foot: Secondary | ICD-10-CM | POA: Diagnosis not present

## 2021-05-23 ENCOUNTER — Ambulatory Visit: Payer: Medicare HMO | Admitting: Urology

## 2021-05-25 DIAGNOSIS — G35 Multiple sclerosis: Secondary | ICD-10-CM | POA: Diagnosis not present

## 2021-05-25 DIAGNOSIS — Z5112 Encounter for antineoplastic immunotherapy: Secondary | ICD-10-CM | POA: Diagnosis not present

## 2021-05-31 ENCOUNTER — Ambulatory Visit: Payer: Medicare HMO | Admitting: Family Medicine

## 2021-06-02 ENCOUNTER — Encounter: Payer: Self-pay | Admitting: Urology

## 2021-06-14 ENCOUNTER — Ambulatory Visit: Payer: Medicaid Other | Admitting: Family Medicine

## 2021-06-20 ENCOUNTER — Ambulatory Visit (INDEPENDENT_AMBULATORY_CARE_PROVIDER_SITE_OTHER): Payer: Medicare Other | Admitting: Family Medicine

## 2021-06-20 ENCOUNTER — Encounter: Payer: Self-pay | Admitting: Family Medicine

## 2021-06-20 VITALS — BP 125/71 | HR 102 | Temp 97.6°F | Ht 65.0 in | Wt 172.2 lb

## 2021-06-20 DIAGNOSIS — M26609 Unspecified temporomandibular joint disorder, unspecified side: Secondary | ICD-10-CM

## 2021-06-20 DIAGNOSIS — Z23 Encounter for immunization: Secondary | ICD-10-CM

## 2021-06-20 DIAGNOSIS — E039 Hypothyroidism, unspecified: Secondary | ICD-10-CM | POA: Diagnosis not present

## 2021-06-20 NOTE — Progress Notes (Signed)
Subjective:  Patient ID: Amber Williams, female    DOB: January 11, 1976  Age: 46 y.o. MRN: [id number]  CC: Follow-up   HPI [name] presents for  follow-up on  thyroid. The patient has a history of hypothyroidism for many years. Dose adjusted recently due to low TSH. Pt. denies any change in  voice, loss of hair, heat or cold intolerance. Energy level has been adequate to good. Patient denies constipation and diarrhea. No myxedema. Medication is as noted below. Verified that pt is taking it daily on an empty stomach. Well tolerated.  Sleeping well on current regimen. Insomnia not currently an issue.   Depression screen [facility] 2/9 06/20/2021 06/20/2021 04/18/2021  Decreased Interest 0 0 0  Down, Depressed, Hopeless 0 0 1  PHQ - 2 Score 0 0 1  Altered sleeping 0 - 3  Tired, decreased energy 1 - 0  Change in appetite 0 - 1  Feeling bad or failure about yourself  0 - 1  Trouble concentrating 0 - 0  Moving slowly or fidgety/restless 0 - 0  Suicidal thoughts 0 - 0  PHQ-9 Score 1 - 6  Difficult doing work/chores Not difficult at all - Somewhat difficult  Some recent data might be hidden    History Mag[place]upe has a past medical history of Anal cancer ([place]) (01/10/2019), Arthritis, Depression, DVT (deep venous thrombosis) ([place]) (2013), Essential hypertension, Family history of breast cancer, Gait abnormality (08/03/2016), GERD (gastroesophageal reflux disease) (08/03/2020), History of blood transfusion (2013), Hyperlipidemia, Hypothyroidism, Hypothyroidism, Multiple sclerosis ([place]), Neurogenic bladder (08/03/2016), Port-A-Cath in place (01/23/2019), PTSD (post-traumatic stress disorder), PTSD (post-traumatic stress disorder), Right foot drop (08/03/2016), Seasonal allergies, and Ulcer.   She has a past surgical history that includes Colon surgery; Gastric bypass (2010); Interstim Implant placement (2014); Portacath placement; Hemorrhoid surgery (N/A, 12/25/2018); Flexible sigmoidoscopy (N/A, 06/25/2019);  Colonoscopy with propofol (N/A, 03/04/2020); Colonoscopy (N/A, 07/06/2020); and Interstim Implant placement (N/A, 12/27/2020).   Her family history includes Breast cancer (age of onset: 69) in her sister; Breast cancer (age of onset: 4) in her mother; Cancer in her paternal grandmother; Diabetes in her father, maternal grandmother, and paternal grandmother; Heart disease in her maternal grandfather, maternal grandmother, and paternal grandfather; Hypertension in her father and sister; Miscarriages / Stillbirths in her sister; Stroke in her paternal grandfather.She reports that she quit smoking about 12 years ago. Her smoking use included cigarettes. She started smoking about 30 years ago. She has a 22.50 pack-year smoking history. She has never used smokeless tobacco. She reports that she does not currently use alcohol. She reports that she does not use drugs.    ROS Review of Systems  Constitutional: Negative.   HENT: Negative.    Eyes:  Negative for visual disturbance.  Respiratory:  Negative for shortness of breath.   Cardiovascular:  Negative for chest pain.  Gastrointestinal:  Negative for abdominal pain.  Musculoskeletal:  Negative for arthralgias.   Objective:  BP 125/71    Pulse (!) 102    Temp 97.6 F (36.4 C)    Ht 5\' 5"  (1.651 m)    Wt 172 lb 3.2 oz (78.1 kg)    SpO2 98%    BMI 28.66 kg/m   BP Readings from Last 3 Encounters:  06/20/21 125/71  05/09/21 126/68  04/18/21 121/82    Wt Readings from Last 3 Encounters:  06/20/21 172 lb 3.2 oz (78.1 kg)  05/09/21 173 lb 9.6 oz (78.7 kg)  04/18/21 188 lb 12.8 oz (85.6 kg)  Physical Exam Constitutional:      General: She is not in acute distress.    Appearance: She is well-developed.  Cardiovascular:     Rate and Rhythm: Normal rate and regular rhythm.  Pulmonary:     Breath sounds: Normal breath sounds.  Musculoskeletal:        General: Normal range of motion.  Skin:    General: Skin is warm and dry.  Neurological:      Mental Status: She is alert and oriented to person, place, and time.      Assessment & Plan:   Addalie was seen today for follow-up.  Diagnoses and all orders for this visit:  Need for Tdap vaccination -     Tdap vaccine greater than or equal to 7yo IM  TMJ (temporomandibular joint syndrome) -     Ambulatory referral to ENT  Acquired hypothyroidism -     TSH + free T4       I have discontinued Yaa D. Decoster's traZODone and cevimeline. [name] Vitamin D3, vitamin B-12, Allergy Relief, multivitamin with minerals, Vitamin A, QUEtiapine, omeprazole, PARoxetine, levothyroxine, mirabegron ER, and apixaban.  Allergies as of 06/20/2021       Reactions   Tecfidera [dimethyl Fumarate] Itching        Medication List        Accurate as of June 20, 2021  6:41 PM. If you have any questions, ask your nurse or doctor.          STOP taking these medications    cevimeline 30 MG capsule Commonly known as: [place] by: [name], MD   traZODone 150 MG tablet Commonly known as: DESYREL Stopped by: [name], MD       TAKE these medications    Allergy Relief 180 MG tablet Generic drug: fexofenadine Take 180 mg by mouth daily.   apixaban 5 MG Tabs tablet Commonly known as: Eliquis Take 1 tablet (5 mg total) by mouth 2 (two) times daily.   levothyroxine 112 MCG tablet Commonly known as: SYNTHROID TAKE 1 TABLET BY MOUTH DAILY BEFORE breakfast What changed: Another medication with the same name was removed. Continue taking this medication, and follow the directions you see here. Changed by: [name], MD   mirabegron ER 50 MG Tb24 tablet Commonly known as: MYRBETRIQ Take 50 mg by mouth daily.   multivitamin with minerals tablet Take 1 tablet by mouth daily. One a day woman   omeprazole 40 MG capsule Commonly known as: PRILOSEC Take 1 capsule (40 mg total) by mouth daily.   PARoxetine 20 MG  tablet Commonly known as: Paxil Take 1 tablet (20 mg total) by mouth daily.   QUEtiapine 50 MG tablet Commonly known as: SEROquel Take 1 tablet (50 mg total) by mouth at bedtime.   Vitamin A 2400 MCG (8000 UT) Caps Take 8,000 Units by mouth daily.   vitamin B-12 500 MCG tablet Commonly known as: CYANOCOBALAMIN Take 500 mcg by mouth daily.   Vitamin D3 125 MCG (5000 UT) Caps Take 5,000 Units by mouth every morning.         Follow-up: Return in about 3 months (around 09/17/2021).  [name], M.D.

## 2021-06-21 LAB — TSH+FREE T4
Free T4: 1.47 ng/dL (ref 0.82–1.77)
TSH: 0.115 u[IU]/mL — ABNORMAL LOW (ref 0.450–4.500)

## 2021-06-21 NOTE — Progress Notes (Signed)
Hello Abbigail,  Your lab result is normal and/or stable.Some minor variations that are not significant are commonly marked abnormal, but do not represent any medical problem for you.  Best regards, [name], M.D.

## 2021-06-28 ENCOUNTER — Other Ambulatory Visit: Payer: Self-pay | Admitting: Family Medicine

## 2021-06-28 ENCOUNTER — Other Ambulatory Visit: Payer: Self-pay | Admitting: Nurse Practitioner

## 2021-06-28 DIAGNOSIS — G4700 Insomnia, unspecified: Secondary | ICD-10-CM

## 2021-06-29 ENCOUNTER — Ambulatory Visit: Payer: Medicare Other | Admitting: General Surgery

## 2021-06-30 ENCOUNTER — Ambulatory Visit: Payer: Medicare Other | Admitting: General Surgery

## 2021-07-04 ENCOUNTER — Encounter: Payer: Self-pay | Admitting: Urology

## 2021-07-14 ENCOUNTER — Encounter: Payer: Self-pay | Admitting: General Surgery

## 2021-07-14 ENCOUNTER — Ambulatory Visit (INDEPENDENT_AMBULATORY_CARE_PROVIDER_SITE_OTHER): Payer: Medicare Other | Admitting: General Surgery

## 2021-07-14 ENCOUNTER — Other Ambulatory Visit: Payer: Self-pay

## 2021-07-14 VITALS — BP 115/73 | HR 107 | Temp 97.6°F | Resp 14 | Ht 65.0 in | Wt 173.0 lb

## 2021-07-14 DIAGNOSIS — C210 Malignant neoplasm of anus, unspecified: Secondary | ICD-10-CM | POA: Diagnosis not present

## 2021-07-14 MED ORDER — SUTAB 1479-225-188 MG PO TABS: 1.0000 | ORAL_TABLET | Freq: Once | ORAL | 0 refills | Status: AC
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
## 2021-07-14 NOTE — Patient Instructions (Signed)
Stop Eliquis on 07/31/21. ?

## 2021-07-18 ENCOUNTER — Ambulatory Visit (INDEPENDENT_AMBULATORY_CARE_PROVIDER_SITE_OTHER): Payer: Medicare Other | Admitting: Gastroenterology

## 2021-07-18 ENCOUNTER — Encounter (INDEPENDENT_AMBULATORY_CARE_PROVIDER_SITE_OTHER): Payer: Self-pay

## 2021-07-18 ENCOUNTER — Other Ambulatory Visit: Payer: Self-pay

## 2021-07-18 ENCOUNTER — Encounter (HOSPITAL_COMMUNITY): Payer: Self-pay | Admitting: Hematology

## 2021-07-18 ENCOUNTER — Other Ambulatory Visit (INDEPENDENT_AMBULATORY_CARE_PROVIDER_SITE_OTHER): Payer: Self-pay

## 2021-07-18 ENCOUNTER — Encounter (INDEPENDENT_AMBULATORY_CARE_PROVIDER_SITE_OTHER): Payer: Self-pay | Admitting: Gastroenterology

## 2021-07-18 VITALS — BP 118/74 | HR 105 | Temp 98.1°F | Ht 65.0 in | Wt 174.1 lb

## 2021-07-18 DIAGNOSIS — Z01812 Encounter for preprocedural laboratory examination: Secondary | ICD-10-CM

## 2021-07-18 DIAGNOSIS — K219 Gastro-esophageal reflux disease without esophagitis: Secondary | ICD-10-CM | POA: Diagnosis not present

## 2021-07-18 DIAGNOSIS — R1310 Dysphagia, unspecified: Secondary | ICD-10-CM

## 2021-07-18 MED ORDER — FAMOTIDINE 40 MG PO TABS: 40.0000 mg | ORAL_TABLET | Freq: Every evening | ORAL | 3 refills | Status: DC | PRN
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
## 2021-07-18 NOTE — Patient Instructions (Signed)
Schedule EGD with ED ?Continue Prilosec 40 mg qday, can take Pepcid as needed at night for breakthrough heartburn ?

## 2021-07-25 NOTE — Progress Notes (Signed)
Pt scheduled for colonoscopy 08/02/2021.  Called and verified with pt that she is postmenopausal s/p chemotherapy.  She will not need a pregnancy test pre-op.   ?

## 2021-08-02 ENCOUNTER — Ambulatory Visit (HOSPITAL_COMMUNITY): Payer: Medicare Other | Admitting: Anesthesiology

## 2021-08-02 ENCOUNTER — Encounter (HOSPITAL_COMMUNITY): Payer: Self-pay | Admitting: General Surgery

## 2021-08-02 ENCOUNTER — Ambulatory Visit (HOSPITAL_BASED_OUTPATIENT_CLINIC_OR_DEPARTMENT_OTHER): Payer: Medicare Other | Admitting: Anesthesiology

## 2021-08-02 ENCOUNTER — Other Ambulatory Visit: Payer: Self-pay

## 2021-08-02 ENCOUNTER — Encounter (HOSPITAL_COMMUNITY)
Admission: RE | Disposition: A | Discharge status: Home / Self Care | Payer: Self-pay | Source: Home / Self Care | Attending: General Surgery

## 2021-08-02 ENCOUNTER — Ambulatory Visit (HOSPITAL_COMMUNITY)
Admission: RE | Admit: 2021-08-02 | Discharge: 2021-08-02 | Disposition: A | Discharge status: Home / Self Care | Payer: Medicare Other | Attending: General Surgery | Admitting: General Surgery

## 2021-08-02 DIAGNOSIS — Z7901 Long term (current) use of anticoagulants: Secondary | ICD-10-CM | POA: Insufficient documentation

## 2021-08-02 DIAGNOSIS — M21371 Foot drop, right foot: Secondary | ICD-10-CM | POA: Diagnosis not present

## 2021-08-02 DIAGNOSIS — Z87891 Personal history of nicotine dependence: Secondary | ICD-10-CM | POA: Diagnosis not present

## 2021-08-02 DIAGNOSIS — Z1211 Encounter for screening for malignant neoplasm of colon: Secondary | ICD-10-CM | POA: Insufficient documentation

## 2021-08-02 DIAGNOSIS — Z86718 Personal history of other venous thrombosis and embolism: Secondary | ICD-10-CM | POA: Insufficient documentation

## 2021-08-02 DIAGNOSIS — I10 Essential (primary) hypertension: Secondary | ICD-10-CM | POA: Insufficient documentation

## 2021-08-02 DIAGNOSIS — R5383 Other fatigue: Secondary | ICD-10-CM | POA: Insufficient documentation

## 2021-08-02 DIAGNOSIS — M1990 Unspecified osteoarthritis, unspecified site: Secondary | ICD-10-CM | POA: Diagnosis not present

## 2021-08-02 DIAGNOSIS — M549 Dorsalgia, unspecified: Secondary | ICD-10-CM | POA: Insufficient documentation

## 2021-08-02 DIAGNOSIS — G35 Multiple sclerosis: Secondary | ICD-10-CM | POA: Diagnosis not present

## 2021-08-02 DIAGNOSIS — R3915 Urgency of urination: Secondary | ICD-10-CM | POA: Insufficient documentation

## 2021-08-02 DIAGNOSIS — E039 Hypothyroidism, unspecified: Secondary | ICD-10-CM | POA: Diagnosis not present

## 2021-08-02 DIAGNOSIS — K219 Gastro-esophageal reflux disease without esophagitis: Secondary | ICD-10-CM | POA: Insufficient documentation

## 2021-08-02 DIAGNOSIS — F418 Other specified anxiety disorders: Secondary | ICD-10-CM | POA: Diagnosis not present

## 2021-08-02 DIAGNOSIS — Z85048 Personal history of other malignant neoplasm of rectum, rectosigmoid junction, and anus: Secondary | ICD-10-CM | POA: Diagnosis not present

## 2021-08-02 DIAGNOSIS — R350 Frequency of micturition: Secondary | ICD-10-CM | POA: Insufficient documentation

## 2021-08-02 DIAGNOSIS — R110 Nausea: Secondary | ICD-10-CM | POA: Diagnosis not present

## 2021-08-02 HISTORY — PX: COLONOSCOPY WITH PROPOFOL: SHX5780

## 2021-08-02 SURGERY — COLONOSCOPY WITH PROPOFOL
Anesthesia: General

## 2021-08-02 MED ORDER — LIDOCAINE HCL (CARDIAC) PF 100 MG/5ML IV SOSY
PREFILLED_SYRINGE | INTRAVENOUS | Status: DC | PRN
  Administered 2021-08-02: 50 mg via INTRAVENOUS

## 2021-08-02 MED ORDER — PHENYLEPHRINE 40 MCG/ML (10ML) SYRINGE FOR IV PUSH (FOR BLOOD PRESSURE SUPPORT)
PREFILLED_SYRINGE | INTRAVENOUS | Status: DC | PRN
  Administered 2021-08-02: 80 ug via INTRAVENOUS
  Administered 2021-08-02: 40 ug via INTRAVENOUS

## 2021-08-02 MED ORDER — LACTATED RINGERS IV SOLN
INTRAVENOUS | Status: DC
  Administered 2021-08-02: 1000 mL via INTRAVENOUS

## 2021-08-02 MED ORDER — PHENYLEPHRINE 40 MCG/ML (10ML) SYRINGE FOR IV PUSH (FOR BLOOD PRESSURE SUPPORT)
PREFILLED_SYRINGE | INTRAVENOUS | Status: AC
  Filled 2021-08-02: qty 10

## 2021-08-02 MED ORDER — PROPOFOL 10 MG/ML IV BOLUS
INTRAVENOUS | Status: DC | PRN
  Administered 2021-08-02: 60 mg via INTRAVENOUS
  Administered 2021-08-02: 130 mg via INTRAVENOUS
  Administered 2021-08-02: 50 mg via INTRAVENOUS
  Administered 2021-08-02: 30 mg via INTRAVENOUS
  Administered 2021-08-02: 50 mg via INTRAVENOUS
  Administered 2021-08-02: 40 mg via INTRAVENOUS

## 2021-08-02 NOTE — Op Note (Signed)
[facility] ?Patient Name: Amber Williams ?Procedure Date: 08/02/2021 7:09 AM ?MRN: [id number] ?Date of Birth: 1975-11-24 ?Attending MD: Aviva Signs , MD ?CSN: [id number] ?Age: 46 ?Admit Type: Outpatient ?Procedure:                Colonoscopy ?Indications:              High risk colon cancer surveillance: Personal  ?                          history of anal cancer ?Providers:                Aviva Signs, MD, Charlsie Quest. Theda Sers RN, RN, Kenney Houseman  ?                          [place] ?Referring MD:              ?Medicines:                Propofol per Anesthesia ?Complications:            No immediate complications. ?Estimated Blood Loss:     Estimated blood loss: none. ?Procedure:                Pre-Anesthesia Assessment: ?                          - Prior to the procedure, a History and Physical  ?                          was performed, and patient medications and  ?                          allergies were reviewed. The patient is competent.  ?                          The risks and benefits of the procedure and the  ?                          sedation options and risks were discussed with the  ?                          patient. All questions were answered and informed  ?                          consent was obtained. Patient identification and  ?                          proposed procedure were verified by the physician,  ?                          the nurse, the anesthetist and the technician in  ?                          the endoscopy suite. Mental Status Examination:  ?                          alert  and oriented. Airway Examination: normal  ?                          oropharyngeal airway and neck mobility. Respiratory  ?                          Examination: clear to auscultation. CV Examination:  ?                          RRR, no murmurs, no S3 or S4. Prophylactic  ?                          Antibiotics: The patient does not require  ?                          prophylactic antibiotics. Prior Anticoagulants:  The  ?                          patient has taken Eliquis (apixaban), last dose was  ?                          2 days prior to procedure. ASA Grade Assessment:  ?                          III - A patient with severe systemic disease. After  ?                          reviewing the risks and benefits, the patient was  ?                          deemed in satisfactory condition to undergo the  ?                          procedure. The anesthesia plan was to use monitored  ?                          anesthesia care (MAC). Immediately prior to  ?                          administration of medications, the patient was  ?                          re-assessed for adequacy to receive sedatives. The  ?                          heart rate, respiratory rate, oxygen saturations,  ?                          blood pressure, adequacy of pulmonary ventilation,  ?                          and response to care were monitored throughout the  ?  procedure. The physical status of the patient was  ?                          re-assessed after the procedure. ?                          After obtaining informed consent, the colonoscope  ?                          was passed under direct vision. Throughout the  ?                          procedure, the patient's blood pressure, pulse, and  ?                          oxygen saturations were monitored continuously. The  ?                          [id number]) scope was introduced through  ?                          the anus and advanced to the the cecum, identified  ?                          by the appendiceal orifice, ileocecal valve and  ?                          palpation. No anatomical landmarks were  ?                          photographed. The entire colon was examined. The  ?                          colonoscopy was performed without difficulty. The  ?                          patient tolerated the procedure well. The quality  ?                           of the bowel preparation was fair. The total  ?                          duration of the procedure was 23 minutes. ?Scope In: 7:29:36 AM ?Scope Out: 7:52:53 AM ?Scope Withdrawal Time: 0 hours 3 minutes 37 seconds  ?Total Procedure Duration: 0 hours 23 minutes 17 seconds  ?Findings: ?     The perianal and digital rectal examinations were normal. ?     The entire examined colon appeared normal on direct and retroflexion  ?     views. ?Impression:               - Preparation of the colon was fair. ?                          - The entire examined colon is normal on direct and  ?  retroflexion views. ?                          - No specimens collected. ?Moderate Sedation: ?     Moderate (conscious) sedation was personally administered by an  ?     anesthesia professional. The following parameters were monitored: oxygen  ?     saturation, heart rate, blood pressure, and response to care. ?Recommendation:           - Written discharge instructions were provided to  ?                          the patient. ?                          - The signs and symptoms of potential delayed  ?                          complications were discussed with the patient. ?                          - Patient has a contact number available for  ?                          emergencies. ?                          - Return to normal activities tomorrow. ?                          - Resume previous diet. ?                          - Continue present medications. ?                          - Repeat colonoscopy in 5 years. ?Procedure Code(s):        --- Professional --- ?                          [phone], Colonoscopy, flexible; diagnostic, including  ?                          collection of specimen(s) by brushing or washing,  ?                          when performed (separate procedure) ?Diagnosis Code(s):        --- Professional --- ?                          O03.559, Personal history of other malignant  ?                           neoplasm of rectum, rectosigmoid junction, and anus ?CPT copyright 2019 [employer]. All rights reserved. ?The codes documented in this report are preliminary and upon coder review may  ?be revised to meet current compliance requirements. ?Aviva Signs, MD ?Aviva Signs, MD ?08/02/2021  7:59:56 AM ?This report has been signed electronically. ?Number of Addenda: 0 ?

## 2021-08-02 NOTE — Anesthesia Preprocedure Evaluation (Addendum)
Anesthesia Evaluation  ?Patient identified by MRN, date of birth, ID band ?Patient awake ? ? ? ?Reviewed: ?Allergy & Precautions, NPO status , Patient's Chart, lab work & pertinent test results ? ?Airway ?Mallampati: I ? ?TM Distance: >3 FB ?Neck ROM: Full ? ? ? Dental ? ?(+) Dental Advisory Given, Teeth Intact ?  ?Pulmonary ?neg pulmonary ROS, former smoker,  ?  ?Pulmonary exam normal ?breath sounds clear to auscultation ? ? ? ? ? ? Cardiovascular ?Exercise Tolerance: Good ?hypertension, Pt. on medications ?+ DVT  ?Normal cardiovascular exam ?Rhythm:Regular Rate:Normal ? ? ?  ?Neuro/Psych ?PSYCHIATRIC DISORDERS Anxiety Depression  Neuromuscular disease (Right foot drop, multiple sclerosis)   ? GI/Hepatic ?Neg liver ROS, GERD  Medicated,Anal cancer ?  ?Endo/Other  ?Hypothyroidism  ? Renal/GU ?negative Renal ROS  ?negative genitourinary ?  ?Musculoskeletal ? ?(+) Arthritis ,  ? Abdominal ?  ?Peds ?negative pediatric ROS ?(+)  Hematology ?negative hematology ROS ?(+)   ?Anesthesia Other Findings ? ? Reproductive/Obstetrics ?negative OB ROS ? ?  ? ? ? ? ? ? ? ? ? ? ? ? ? ?  ?  ? ? ? ? ? ? ? ?Anesthesia Physical ?Anesthesia Plan ? ?ASA: 3 ? ?Anesthesia Plan: General  ? ?Post-op Pain Management: Minimal or no pain anticipated  ? ?Induction: Intravenous ? ?PONV Risk Score and Plan: Treatment may vary due to age or medical condition ? ?Airway Management Planned: Nasal Cannula and Natural Airway ? ?Additional Equipment:  ? ?Intra-op Plan:  ? ?Post-operative Plan:  ? ?Informed Consent: I have reviewed the patients History and Physical, chart, labs and discussed the procedure including the risks, benefits and alternatives for the proposed anesthesia with the patient or authorized representative who has indicated his/her understanding and acceptance.  ? ? ? ?Dental advisory given ? ?Plan Discussed with: Surgeon and CRNA ? ?Anesthesia Plan Comments:   ? ? ? ? ? ?Anesthesia Quick Evaluation ? ?

## 2021-08-02 NOTE — Transfer of Care (Signed)
Immediate Anesthesia Transfer of Care Note ? ?Patient: Amber Williams ? ?Procedure(s) Performed: COLONOSCOPY WITH PROPOFOL ? ?Patient Location: Endoscopy Unit ? ?Anesthesia Type:General ? ?Level of Consciousness: awake ? ?Airway & Oxygen Therapy: Patient Spontanous Breathing ? ?Post-op Assessment: Report given to RN and Post -op Vital signs reviewed and stable ? ?Post vital signs: Reviewed and stable ? ?Last Vitals:  ?Vitals Value Taken Time  ?BP    ?Temp    ?Pulse    ?Resp    ?SpO2    ? ? ?Last Pain:  ?Vitals:  ? 08/02/21 0727  ?TempSrc:   ?PainSc: 0-No pain  ?   ? ?Patients Stated Pain Goal: 9 (08/02/21 0703) ? ?Complications: No notable events documented. ?

## 2021-08-02 NOTE — Interval H&P Note (Signed)
History and Physical Interval Note: ? ?08/02/2021 ?7:26 AM ? ?Amber Williams  has presented today for surgery, with the diagnosis of Anal Cancer.  The various methods of treatment have been discussed with the patient and family. After consideration of risks, benefits and other options for treatment, the patient has consented to  Procedure(s): ?COLONOSCOPY WITH PROPOFOL (N/A) as a surgical intervention.  The patient's history has been reviewed, patient examined, no change in status, stable for surgery.  I have reviewed the patient's chart and labs.  Questions were answered to the patient's satisfaction.   ? ? ?Aviva Signs ? ? ?

## 2021-08-02 NOTE — Anesthesia Postprocedure Evaluation (Signed)
Anesthesia Post Note ? ?Patient: Amber Williams ? ?Procedure(s) Performed: COLONOSCOPY WITH PROPOFOL ? ?Patient location during evaluation: Endoscopy ?Anesthesia Type: General ?Level of consciousness: awake and alert and oriented ?Pain management: pain level controlled ?Vital Signs Assessment: post-procedure vital signs reviewed and stable ?Respiratory status: spontaneous breathing, nonlabored ventilation and respiratory function stable ?Cardiovascular status: blood pressure returned to baseline and stable ?Postop Assessment: no apparent nausea or vomiting ?Anesthetic complications: no ? ? ?No notable events documented. ? ? ?Last Vitals:  ?Vitals:  ? 08/02/21 0756 08/02/21 0758  ?BP:  110/60  ?Pulse: 79   ?Resp: 17   ?Temp: 36.8 ?C   ?SpO2: 99%   ?  ?Last Pain:  ?Vitals:  ? 08/02/21 0756  ?TempSrc: Oral  ?PainSc: 0-No pain  ? ? ?  ?  ?  ?  ?  ?  ? ?Norvell Ureste C Krithik Mapel ? ? ? ? ?

## 2021-08-02 NOTE — Anesthesia Procedure Notes (Signed)
Date/Time: 08/02/2021 7:31 AM ?Performed by: Orlie Dakin, CRNA ?Pre-anesthesia Checklist: Patient identified, Emergency Drugs available, Suction available and Patient being monitored ?Patient Re-evaluated:Patient Re-evaluated prior to induction ?Oxygen Delivery Method: Nasal cannula ?Induction Type: IV induction ?Placement Confirmation: positive ETCO2 ? ? ? ? ?

## 2021-08-08 ENCOUNTER — Encounter (HOSPITAL_COMMUNITY): Payer: Self-pay | Admitting: General Surgery

## 2021-08-15 ENCOUNTER — Other Ambulatory Visit (HOSPITAL_COMMUNITY)
Admission: RE | Admit: 2021-08-15 | Discharge: 2021-08-15 | Disposition: A | Discharge status: Home / Self Care | Payer: Medicare Other | Source: Ambulatory Visit | Attending: Gastroenterology | Admitting: Gastroenterology

## 2021-08-15 DIAGNOSIS — Z01812 Encounter for preprocedural laboratory examination: Secondary | ICD-10-CM

## 2021-08-15 DIAGNOSIS — R1310 Dysphagia, unspecified: Secondary | ICD-10-CM | POA: Insufficient documentation

## 2021-08-15 DIAGNOSIS — C210 Malignant neoplasm of anus, unspecified: Secondary | ICD-10-CM | POA: Diagnosis not present

## 2021-08-15 LAB — CBC WITH DIFFERENTIAL/PLATELET
Abs Immature Granulocytes: 0.03 10*3/uL (ref 0.00–0.07)
Basophils Absolute: 0 10*3/uL (ref 0.0–0.1)
Basophils Relative: 0 %
Eosinophils Absolute: 0.1 10*3/uL (ref 0.0–0.5)
Eosinophils Relative: 1 %
HCT: 40.8 % (ref 36.0–46.0)
Hemoglobin: 12.8 g/dL (ref 12.0–15.0)
Immature Granulocytes: 0 %
Lymphocytes Relative: 10 %
Lymphs Abs: 0.9 10*3/uL (ref 0.7–4.0)
MCH: 31.6 pg (ref 26.0–34.0)
MCHC: 31.4 g/dL (ref 30.0–36.0)
MCV: 100.7 fL — ABNORMAL HIGH (ref 80.0–100.0)
Monocytes Absolute: 0.9 10*3/uL (ref 0.1–1.0)
Monocytes Relative: 10 %
Neutro Abs: 7.2 10*3/uL (ref 1.7–7.7)
Neutrophils Relative %: 79 %
Platelets: 353 10*3/uL (ref 150–400)
RBC: 4.05 MIL/uL (ref 3.87–5.11)
RDW: 13.2 % (ref 11.5–15.5)
WBC: 9.1 10*3/uL (ref 4.0–10.5)
nRBC: 0 % (ref 0.0–0.2)

## 2021-08-15 LAB — COMPREHENSIVE METABOLIC PANEL
ALT: 18 U/L (ref 0–44)
AST: 22 U/L (ref 15–41)
Albumin: 3.9 g/dL (ref 3.5–5.0)
Alkaline Phosphatase: 113 U/L (ref 38–126)
Anion gap: 7 (ref 5–15)
BUN: 12 mg/dL (ref 6–20)
CO2: 27 mmol/L (ref 22–32)
Calcium: 10 mg/dL (ref 8.9–10.3)
Chloride: 105 mmol/L (ref 98–111)
Creatinine, Ser: 0.81 mg/dL (ref 0.44–1.00)
GFR, Estimated: >60 mL/min (ref >60)
Glucose, Bld: 81 mg/dL (ref 70–99)
Potassium: 4.4 mmol/L (ref 3.5–5.1)
Sodium: 139 mmol/L (ref 135–145)
Total Bilirubin: 0.3 mg/dL (ref 0.3–1.2)
Total Protein: 6.3 g/dL — ABNORMAL LOW (ref 6.5–8.1)

## 2021-08-15 LAB — PREGNANCY, URINE: Preg Test, Ur: NEGATIVE

## 2021-08-17 ENCOUNTER — Encounter (HOSPITAL_COMMUNITY)
Admission: RE | Disposition: A | Discharge status: Home / Self Care | Payer: Self-pay | Source: Ambulatory Visit | Attending: Gastroenterology

## 2021-08-17 ENCOUNTER — Other Ambulatory Visit: Payer: Self-pay

## 2021-08-17 ENCOUNTER — Encounter (HOSPITAL_COMMUNITY): Payer: Self-pay | Admitting: Gastroenterology

## 2021-08-17 ENCOUNTER — Ambulatory Visit (HOSPITAL_BASED_OUTPATIENT_CLINIC_OR_DEPARTMENT_OTHER): Payer: Medicare Other | Admitting: Anesthesiology

## 2021-08-17 ENCOUNTER — Ambulatory Visit (HOSPITAL_COMMUNITY): Payer: Medicare Other | Admitting: Anesthesiology

## 2021-08-17 ENCOUNTER — Ambulatory Visit (HOSPITAL_COMMUNITY)
Admission: RE | Admit: 2021-08-17 | Discharge: 2021-08-17 | Disposition: A | Discharge status: Home / Self Care | Payer: Medicare Other | Source: Ambulatory Visit | Attending: Gastroenterology | Admitting: Gastroenterology

## 2021-08-17 DIAGNOSIS — Z87891 Personal history of nicotine dependence: Secondary | ICD-10-CM | POA: Insufficient documentation

## 2021-08-17 DIAGNOSIS — I10 Essential (primary) hypertension: Secondary | ICD-10-CM | POA: Insufficient documentation

## 2021-08-17 DIAGNOSIS — R1310 Dysphagia, unspecified: Secondary | ICD-10-CM

## 2021-08-17 DIAGNOSIS — Z85048 Personal history of other malignant neoplasm of rectum, rectosigmoid junction, and anus: Secondary | ICD-10-CM | POA: Diagnosis not present

## 2021-08-17 DIAGNOSIS — G35 Multiple sclerosis: Secondary | ICD-10-CM | POA: Diagnosis not present

## 2021-08-17 DIAGNOSIS — M1990 Unspecified osteoarthritis, unspecified site: Secondary | ICD-10-CM | POA: Insufficient documentation

## 2021-08-17 DIAGNOSIS — K219 Gastro-esophageal reflux disease without esophagitis: Secondary | ICD-10-CM | POA: Insufficient documentation

## 2021-08-17 DIAGNOSIS — E039 Hypothyroidism, unspecified: Secondary | ICD-10-CM

## 2021-08-17 DIAGNOSIS — Z86718 Personal history of other venous thrombosis and embolism: Secondary | ICD-10-CM | POA: Insufficient documentation

## 2021-08-17 DIAGNOSIS — Z9884 Bariatric surgery status: Secondary | ICD-10-CM | POA: Diagnosis not present

## 2021-08-17 DIAGNOSIS — F418 Other specified anxiety disorders: Secondary | ICD-10-CM | POA: Diagnosis not present

## 2021-08-17 HISTORY — PX: BIOPSY: SHX5522

## 2021-08-17 HISTORY — PX: ESOPHAGEAL DILATION: SHX303

## 2021-08-17 HISTORY — PX: ESOPHAGOGASTRODUODENOSCOPY (EGD) WITH PROPOFOL: SHX5813

## 2021-08-17 SURGERY — ESOPHAGOGASTRODUODENOSCOPY (EGD) WITH PROPOFOL
Anesthesia: General

## 2021-08-17 MED ORDER — PHENYLEPHRINE 80 MCG/ML (10ML) SYRINGE FOR IV PUSH (FOR BLOOD PRESSURE SUPPORT)
PREFILLED_SYRINGE | INTRAVENOUS | Status: AC
  Filled 2021-08-17: qty 10

## 2021-08-17 MED ORDER — LACTATED RINGERS IV SOLN
INTRAVENOUS | Status: DC
  Administered 2021-08-17: 1000 mL via INTRAVENOUS

## 2021-08-17 MED ORDER — PROPOFOL 10 MG/ML IV BOLUS
INTRAVENOUS | Status: DC | PRN
  Administered 2021-08-17: 50 mg via INTRAVENOUS
  Administered 2021-08-17: 30 mg via INTRAVENOUS
  Administered 2021-08-17 (×3): 50 mg via INTRAVENOUS
  Administered 2021-08-17: 70 mg via INTRAVENOUS
  Administered 2021-08-17: 30 mg via INTRAVENOUS

## 2021-08-17 MED ORDER — PHENYLEPHRINE 80 MCG/ML (10ML) SYRINGE FOR IV PUSH (FOR BLOOD PRESSURE SUPPORT)
PREFILLED_SYRINGE | INTRAVENOUS | Status: DC | PRN
  Administered 2021-08-17: 80 ug via INTRAVENOUS

## 2021-08-17 MED ORDER — LIDOCAINE HCL (CARDIAC) PF 100 MG/5ML IV SOSY
PREFILLED_SYRINGE | INTRAVENOUS | Status: DC | PRN
  Administered 2021-08-17: 50 mg via INTRAVENOUS

## 2021-08-17 NOTE — Anesthesia Postprocedure Evaluation (Signed)
Anesthesia Post Note ? ?Patient: Amber Williams ? ?Procedure(s) Performed: ESOPHAGOGASTRODUODENOSCOPY (EGD) WITH PROPOFOL ?ESOPHAGEAL DILATION ?BIOPSY ? ?Patient location during evaluation: Phase II ?Anesthesia Type: General ?Level of consciousness: awake ?Pain management: pain level controlled ?Vital Signs Assessment: post-procedure vital signs reviewed and stable ?Respiratory status: spontaneous breathing and respiratory function stable ?Cardiovascular status: blood pressure returned to baseline and stable ?Postop Assessment: no headache and no apparent nausea or vomiting ?Anesthetic complications: no ?Comments: Late entry ? ? ?No notable events documented. ? ? ?Last Vitals:  ?Vitals:  ? 08/17/21 1132 08/17/21 1140  ?BP: (!) 94/45 (!) 100/50  ?Pulse: 86 84  ?Resp: 15 19  ?Temp: 36.4 ?C   ?SpO2: 98% 98%  ?  ?Last Pain:  ?Vitals:  ? 08/17/21 1140  ?TempSrc:   ?PainSc: 0-No pain  ? ? ?  ?  ?  ?  ?  ?  ? ?Louann Sjogren ? ? ? ? ?

## 2021-08-17 NOTE — Anesthesia Preprocedure Evaluation (Signed)
Anesthesia Evaluation  ?Patient identified by MRN, date of birth, ID band ?Patient awake ? ? ? ?Reviewed: ?Allergy & Precautions, NPO status , Patient's Chart, lab work & pertinent test results ? ?Airway ?Mallampati: I ? ?TM Distance: >3 FB ?Neck ROM: Full ? ? ? Dental ? ?(+) Dental Advisory Given, Teeth Intact ?  ?Pulmonary ?neg pulmonary ROS, former smoker,  ?  ?Pulmonary exam normal ?breath sounds clear to auscultation ? ? ? ? ? ? Cardiovascular ?Exercise Tolerance: Good ?hypertension, Pt. on medications ?+ DVT  ?Normal cardiovascular exam ?Rhythm:Regular Rate:Normal ? ? ?  ?Neuro/Psych ?PSYCHIATRIC DISORDERS Anxiety Depression  Neuromuscular disease (Right foot drop, multiple sclerosis)   ? GI/Hepatic ?Neg liver ROS, GERD  Medicated,Anal cancer ?  ?Endo/Other  ?Hypothyroidism  ? Renal/GU ?negative Renal ROS  ?negative genitourinary ?  ?Musculoskeletal ? ?(+) Arthritis ,  ? Abdominal ?  ?Peds ?negative pediatric ROS ?(+)  Hematology ?negative hematology ROS ?(+)   ?Anesthesia Other Findings ? ? Reproductive/Obstetrics ?negative OB ROS ? ?  ? ? ? ? ? ? ? ? ? ? ? ? ? ?  ?  ? ? ? ? ? ? ? ? ?Anesthesia Physical ? ?Anesthesia Plan ? ?ASA: 3 ? ?Anesthesia Plan: General  ? ?Post-op Pain Management: Minimal or no pain anticipated  ? ?Induction: Intravenous ? ?PONV Risk Score and Plan: Propofol infusion ? ?Airway Management Planned: Nasal Cannula and Natural Airway ? ?Additional Equipment:  ? ?Intra-op Plan:  ? ?Post-operative Plan:  ? ?Informed Consent: I have reviewed the patients History and Physical, chart, labs and discussed the procedure including the risks, benefits and alternatives for the proposed anesthesia with the patient or authorized representative who has indicated his/her understanding and acceptance.  ? ? ? ?Dental advisory given ? ?Plan Discussed with: Surgeon and CRNA ? ?Anesthesia Plan Comments:   ? ? ? ? ? ? ?Anesthesia Quick Evaluation ? ?

## 2021-08-17 NOTE — Discharge Instructions (Addendum)
You are being discharged to home.  ?Resume your previous diet.  ?We are waiting for your pathology results.  ?If negative testing and persistent dysphagia, may proceed with esophageal manometry. ?Restart Eliquis today ?

## 2021-08-17 NOTE — Transfer of Care (Signed)
Immediate Anesthesia Transfer of Care Note ? ?Patient: Amber Williams ? ?Procedure(s) Performed: ESOPHAGOGASTRODUODENOSCOPY (EGD) WITH PROPOFOL ?ESOPHAGEAL DILATION ?BIOPSY ? ?Patient Location: Endoscopy Unit ? ?Anesthesia Type:General ? ?Level of Consciousness: awake ? ?Airway & Oxygen Therapy: Patient Spontanous Breathing ? ?Post-op Assessment: Report given to RN and Post -op Vital signs reviewed and stable ? ?Post vital signs: Reviewed and stable ? ?Last Vitals:  ?Vitals Value Taken Time  ?BP    ?Temp    ?Pulse    ?Resp    ?SpO2    ? ? ?Last Pain:  ?Vitals:  ? 08/17/21 1110  ?TempSrc:   ?PainSc: 0-No pain  ?   ? ?Patients Stated Pain Goal: 8 (08/17/21 1041) ? ?Complications: No notable events documented. ?

## 2021-08-17 NOTE — Anesthesia Procedure Notes (Signed)
Date/Time: 08/17/2021 11:20 AM ?Performed by: Orlie Dakin, CRNA ?Pre-anesthesia Checklist: Patient identified, Emergency Drugs available, Suction available and Patient being monitored ?Patient Re-evaluated:Patient Re-evaluated prior to induction ?Oxygen Delivery Method: Nasal cannula ?Induction Type: IV induction ?Placement Confirmation: positive ETCO2 ? ? ? ? ?

## 2021-08-17 NOTE — Op Note (Signed)
[facility] ?Patient Name: Amber Williams ?Procedure Date: 08/17/2021 10:38 AM ?MRN: [id number] ?Date of Birth: Oct 06, 1975 ?Attending MD: Maylon Peppers ,  ?CSN: [id number] ?Age: 46 ?Admit Type: Outpatient ?Procedure:                Upper GI endoscopy ?Indications:              Dysphagia ?Providers:                Maylon Peppers, Lambert Mody, Kristine L.  ?                          Risa Grill, Technician ?Referring MD:              ?Medicines:                Monitored Anesthesia Care ?Complications:            No immediate complications. ?Estimated Blood Loss:     Estimated blood loss: none. ?Procedure:                Pre-Anesthesia Assessment: ?                          - Prior to the procedure, a History and Physical  ?                          was performed, and patient medications, allergies  ?                          and sensitivities were reviewed. The patient's  ?                          tolerance of previous anesthesia was reviewed. ?                          - The risks and benefits of the procedure and the  ?                          sedation options and risks were discussed with the  ?                          patient. All questions were answered and informed  ?                          consent was obtained. ?                          - ASA Grade Assessment: III - A patient with severe  ?                          systemic disease. ?                          After obtaining informed consent, the endoscope was  ?                          passed under direct vision. Throughout the  ?  procedure, the patient's blood pressure, pulse, and  ?                          oxygen saturations were monitored continuously. The  ?                          GIF-H190 ([id number]) scope was introduced through the  ?                          mouth, and advanced to the jejunum. The upper GI  ?                          endoscopy was accomplished without difficulty. The  ?                           patient tolerated the procedure well. ?Scope In: 11:13:42 AM ?Scope Out: 11:28:30 AM ?Total Procedure Duration: 0 hours 14 minutes 48 seconds  ?Findings: ?     No endoscopic abnormality was evident in the esophagus to explain the  ?     patient's complaint of dysphagia. It was decided, however, to proceed  ?     with dilation of the entire esophagus. A guidewire was placed and the  ?     scope was withdrawn. Dilation was performed with a Savary dilator with  ?     no resistance at 18 mm. No mucosal disruption was seen upon  ?     reinspection. Biopsies were obtained from the proximal and distal  ?     esophagus with cold forceps for histology of suspected eosinophilic  ?     esophagitis. ?     Evidence of a gastric bypass was found. A gastric pouch with a 5 cm  ?     length from the GE junction to the gastrojejunal anastomosis was found.  ?     The staple line appeared intact. The gastrojejunal anastomosis was  ?     characterized by healthy appearing mucosa. This was traversed. The  ?     pouch-to-jejunum limb was characterized by healthy appearing mucosa. The  ?     jejunojejunal anastomosis was characterized by healthy appearing mucosa. ?     The examined jejunum was normal. ?Impression:               - No endoscopic esophageal abnormality to explain  ?                          patient's dysphagia. Esophagus dilated. Dilated.  ?                          Biopsied. ?                          - Gastric bypass with a pouch 5 cm in length and  ?                          intact staple line. Gastrojejunal anastomosis  ?                          characterized  by healthy appearing mucosa. ?                          - Normal examined jejunum. ?Moderate Sedation: ?     Per Anesthesia Care ?Recommendation:           - Discharge patient to home (ambulatory). ?                          - Resume previous diet. ?                          - Await pathology results. ?                          - If negative testing and  persistent dysphagia, may  ?                          proceed with esophageal manometry. ?Procedure Code(s):        --- Professional --- ?                          [phone], Esophagogastroduodenoscopy, flexible,  ?                          transoral; with insertion of guide wire followed by  ?                          passage of dilator(s) through esophagus over guide  ?                          wire ?                          43239, 59, Esophagogastroduodenoscopy, flexible,  ?                          transoral; with biopsy, single or multiple ?Diagnosis Code(s):        --- Professional --- ?                          R13.10, Dysphagia, unspecified ?                          Z98.84, Bariatric surgery status ?CPT copyright 2019 American Medical Association. All rights reserved. ?The codes documented in this report are preliminary and upon coder review may  ?be revised to meet current compliance requirements. ?Maylon Peppers, MD ?Maylon Peppers,  ?08/17/2021 11:38:12 AM ?This report has been signed electronically. ?Number of Addenda: 0 ?

## 2021-08-17 NOTE — Interval H&P Note (Signed)
History and Physical Interval Note: ? ?08/17/2021 ?10:44 AM ? ?Amber Williams  has presented today for surgery, with the diagnosis of Dysphagia.  The various methods of treatment have been discussed with the patient and family. After consideration of risks, benefits and other options for treatment, the patient has consented to  Procedure(s) with comments: ?ESOPHAGOGASTRODUODENOSCOPY (EGD) WITH PROPOFOL (N/A) - 215 ?ESOPHAGEAL DILATION (N/A) as a surgical intervention.  The patient's history has been reviewed, patient examined, no change in status, stable for surgery.  I have reviewed the patient's chart and labs.  Questions were answered to the patient's satisfaction.   ? ? ?[name] ? ? ?

## 2021-08-18 ENCOUNTER — Inpatient Hospital Stay (HOSPITAL_COMMUNITY): Payer: Medicare Other | Attending: Hematology

## 2021-08-18 ENCOUNTER — Other Ambulatory Visit (HOSPITAL_COMMUNITY): Payer: Self-pay

## 2021-08-18 DIAGNOSIS — G35 Multiple sclerosis: Secondary | ICD-10-CM | POA: Insufficient documentation

## 2021-08-18 DIAGNOSIS — K921 Melena: Secondary | ICD-10-CM | POA: Diagnosis not present

## 2021-08-18 DIAGNOSIS — I82403 Acute embolism and thrombosis of unspecified deep veins of lower extremity, bilateral: Secondary | ICD-10-CM

## 2021-08-18 DIAGNOSIS — C210 Malignant neoplasm of anus, unspecified: Secondary | ICD-10-CM | POA: Insufficient documentation

## 2021-08-18 DIAGNOSIS — Z7901 Long term (current) use of anticoagulants: Secondary | ICD-10-CM | POA: Diagnosis not present

## 2021-08-18 DIAGNOSIS — Z87891 Personal history of nicotine dependence: Secondary | ICD-10-CM | POA: Insufficient documentation

## 2021-08-18 DIAGNOSIS — Z803 Family history of malignant neoplasm of breast: Secondary | ICD-10-CM | POA: Insufficient documentation

## 2021-08-18 DIAGNOSIS — Z806 Family history of leukemia: Secondary | ICD-10-CM | POA: Insufficient documentation

## 2021-08-18 LAB — CBC WITH DIFFERENTIAL/PLATELET
Abs Immature Granulocytes: 0.03 10*3/uL (ref 0.00–0.07)
Basophils Absolute: 0 10*3/uL (ref 0.0–0.1)
Basophils Relative: 1 %
Eosinophils Absolute: 0.1 10*3/uL (ref 0.0–0.5)
Eosinophils Relative: 2 %
HCT: 37.9 % (ref 36.0–46.0)
Hemoglobin: 12.1 g/dL (ref 12.0–15.0)
Immature Granulocytes: 0 %
Lymphocytes Relative: 10 %
Lymphs Abs: 0.8 10*3/uL (ref 0.7–4.0)
MCH: 32.2 pg (ref 26.0–34.0)
MCHC: 31.9 g/dL (ref 30.0–36.0)
MCV: 100.8 fL — ABNORMAL HIGH (ref 80.0–100.0)
Monocytes Absolute: 0.7 10*3/uL (ref 0.1–1.0)
Monocytes Relative: 9 %
Neutro Abs: 6.4 10*3/uL (ref 1.7–7.7)
Neutrophils Relative %: 78 %
Platelets: 385 10*3/uL (ref 150–400)
RBC: 3.76 MIL/uL — ABNORMAL LOW (ref 3.87–5.11)
RDW: 13.3 % (ref 11.5–15.5)
WBC: 8.1 10*3/uL (ref 4.0–10.5)
nRBC: 0 % (ref 0.0–0.2)

## 2021-08-18 LAB — COMPREHENSIVE METABOLIC PANEL
ALT: 20 U/L (ref 0–44)
AST: 22 U/L (ref 15–41)
Albumin: 3.8 g/dL (ref 3.5–5.0)
Alkaline Phosphatase: 107 U/L (ref 38–126)
Anion gap: 6 (ref 5–15)
BUN: 11 mg/dL (ref 6–20)
CO2: 26 mmol/L (ref 22–32)
Calcium: 9.9 mg/dL (ref 8.9–10.3)
Chloride: 107 mmol/L (ref 98–111)
Creatinine, Ser: 0.87 mg/dL (ref 0.44–1.00)
GFR, Estimated: >60 mL/min (ref >60)
Glucose, Bld: 83 mg/dL (ref 70–99)
Potassium: 3.8 mmol/L (ref 3.5–5.1)
Sodium: 139 mmol/L (ref 135–145)
Total Bilirubin: 0.3 mg/dL (ref 0.3–1.2)
Total Protein: 6.1 g/dL — ABNORMAL LOW (ref 6.5–8.1)

## 2021-08-18 LAB — SURGICAL PATHOLOGY

## 2021-08-19 ENCOUNTER — Encounter (HOSPITAL_COMMUNITY): Payer: Self-pay | Admitting: Gastroenterology

## 2021-08-25 ENCOUNTER — Inpatient Hospital Stay (HOSPITAL_BASED_OUTPATIENT_CLINIC_OR_DEPARTMENT_OTHER): Payer: Medicare Other | Admitting: Hematology

## 2021-08-25 DIAGNOSIS — Z806 Family history of leukemia: Secondary | ICD-10-CM | POA: Diagnosis not present

## 2021-08-25 DIAGNOSIS — C210 Malignant neoplasm of anus, unspecified: Secondary | ICD-10-CM

## 2021-08-25 DIAGNOSIS — I82403 Acute embolism and thrombosis of unspecified deep veins of lower extremity, bilateral: Secondary | ICD-10-CM | POA: Diagnosis not present

## 2021-08-25 DIAGNOSIS — Z7901 Long term (current) use of anticoagulants: Secondary | ICD-10-CM | POA: Diagnosis not present

## 2021-08-25 DIAGNOSIS — Z803 Family history of malignant neoplasm of breast: Secondary | ICD-10-CM | POA: Diagnosis not present

## 2021-08-25 DIAGNOSIS — G35 Multiple sclerosis: Secondary | ICD-10-CM | POA: Diagnosis not present

## 2021-08-25 DIAGNOSIS — Z87891 Personal history of nicotine dependence: Secondary | ICD-10-CM | POA: Diagnosis not present

## 2021-08-25 DIAGNOSIS — K921 Melena: Secondary | ICD-10-CM | POA: Diagnosis not present

## 2021-08-25 NOTE — Patient Instructions (Addendum)
[place] at [facility] ?Discharge Instructions ? ?You were seen and examined today by Dr. Delton Coombes. ? ?Dr. Delton Coombes discussed your most recent lab work which is stable. Your sigmoidoscopy was also good. ? ?Your next scan is due in January of 2024. ? ?Follow-up as scheduled. ? ? ? ?Thank you for choosing [place] at [facility] to provide your oncology and hematology care.  To afford each patient quality time with our provider, please arrive at least 15 minutes before your scheduled appointment time.  ? ?If you have a lab appointment with the [facility] please come in thru the Main Entrance and check in at the main information desk. ? ?You need to re-schedule your appointment should you arrive 10 or more minutes late.  We strive to give you quality time with our providers, and arriving late affects you and other patients whose appointments are after yours.  Also, if you no show three or more times for appointments you may be dismissed from the clinic at the providers discretion.     ?Again, thank you for choosing [facility].  Our hope is that these requests will decrease the amount of time that you wait before being seen by our physicians.       ?_____________________________________________________________ ? ?Should you have questions after your visit to [facility], please contact our office at [phone] and follow the prompts.  Our office hours are 8:00 a.m. and 4:30 p.m. Monday - Friday.  Please note that voicemails left after 4:00 p.m. may not be returned until the following business day.  We are closed weekends and major holidays.  You do have access to a nurse 24-7, just call the main number to the clinic [phone] and do not press any options, hold on the line and a nurse will answer the phone.   ? ?For prescription refill requests, have your pharmacy contact our office and allow 72 hours.   ? ?Due to Covid, you will  need to wear a mask upon entering the hospital. If you do not have a mask, a mask will be given to you at the Main Entrance upon arrival. For doctor visits, patients may have 1 support person age 28 or older with them. For treatment visits, patients can not have anyone with them due to social distancing guidelines and our immunocompromised population.  ? ? ? ?
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
## 2021-08-26 DIAGNOSIS — M21371 Foot drop, right foot: Secondary | ICD-10-CM | POA: Diagnosis not present

## 2021-08-26 DIAGNOSIS — Z79899 Other long term (current) drug therapy: Secondary | ICD-10-CM | POA: Diagnosis not present

## 2021-08-26 DIAGNOSIS — M6281 Muscle weakness (generalized): Secondary | ICD-10-CM | POA: Diagnosis not present

## 2021-08-26 DIAGNOSIS — R5383 Other fatigue: Secondary | ICD-10-CM | POA: Diagnosis not present

## 2021-08-26 DIAGNOSIS — G35 Multiple sclerosis: Secondary | ICD-10-CM | POA: Diagnosis not present

## 2021-08-30 ENCOUNTER — Other Ambulatory Visit (HOSPITAL_COMMUNITY): Payer: Self-pay | Admitting: Specialist

## 2021-08-30 ENCOUNTER — Other Ambulatory Visit: Payer: Self-pay | Admitting: Specialist

## 2021-08-30 DIAGNOSIS — G35 Multiple sclerosis: Secondary | ICD-10-CM

## 2021-09-15 ENCOUNTER — Ambulatory Visit (HOSPITAL_COMMUNITY)
Admission: RE | Admit: 2021-09-15 | Discharge: 2021-09-15 | Disposition: A | Discharge status: Home / Self Care | Payer: Medicare Other | Source: Ambulatory Visit | Attending: Specialist | Admitting: Specialist

## 2021-09-15 DIAGNOSIS — G35 Multiple sclerosis: Secondary | ICD-10-CM | POA: Insufficient documentation

## 2021-09-15 MED ORDER — GADOBUTROL 1 MMOL/ML IV SOLN
7.5000 mL | Freq: Once | INTRAVENOUS | Status: AC | PRN
  Administered 2021-09-15: 7.5 mL via INTRAVENOUS

## 2021-09-19 ENCOUNTER — Ambulatory Visit (INDEPENDENT_AMBULATORY_CARE_PROVIDER_SITE_OTHER): Payer: Medicare Other | Admitting: Family Medicine

## 2021-09-19 ENCOUNTER — Encounter: Payer: Self-pay | Admitting: Family Medicine

## 2021-09-19 VITALS — BP 97/64 | HR 117 | Temp 98.0°F | Ht 65.0 in | Wt 174.2 lb

## 2021-09-19 DIAGNOSIS — Z13220 Encounter for screening for lipoid disorders: Secondary | ICD-10-CM | POA: Diagnosis not present

## 2021-09-19 DIAGNOSIS — G35 Multiple sclerosis: Secondary | ICD-10-CM

## 2021-09-19 DIAGNOSIS — E039 Hypothyroidism, unspecified: Secondary | ICD-10-CM

## 2021-09-19 DIAGNOSIS — G4700 Insomnia, unspecified: Secondary | ICD-10-CM

## 2021-09-19 DIAGNOSIS — R072 Precordial pain: Secondary | ICD-10-CM

## 2021-09-19 MED ORDER — QUETIAPINE FUMARATE 50 MG PO TABS: 50.0000 mg | ORAL_TABLET | Freq: Every day | ORAL | 3 refills | Status: DC

## 2021-09-19 NOTE — Progress Notes (Signed)
Subjective:  Patient ID: Amber Williams, female    DOB: [birth year]  Age: 46 y.o. MRN: [id number]  CC: Medical Management of Chronic Issues   HPI DIANNE WHELCHEL presents for  follow-up on  thyroid. The patient has a history of hypothyroidism for many years. It has been stable recently. Pt. denies any change in  voice, loss of hair, heat or cold intolerance. Energy level has been adequate to good. Patient denies constipation and diarrhea. No myxedema. Medication is as noted below. Verified that pt is taking it daily on an empty stomach. Well tolerated.  Precordial press-ure qod lasts about 5 min. Not exertional. Radiates to left arm. No dyspnea or diaphoresis.       09/19/2021    2:37 PM 06/20/2021    3:53 PM 06/20/2021    3:45 PM  Depression screen PHQ 2/9  Decreased Interest 0 0 0  Down, Depressed, Hopeless 0 0 0  PHQ - 2 Score 0 0 0  Altered sleeping  0   Tired, decreased energy  1   Change in appetite  0   Feeling bad or failure about yourself   0   Trouble concentrating  0   Moving slowly or fidgety/restless  0   Suicidal thoughts  0   PHQ-9 Score  1   Difficult doing work/chores  Not difficult at all       04/18/2021    2:46 PM 03/30/2021    3:32 PM 01/17/2021    3:57 PM 12/03/2020    8:44 AM  GAD 7 : Generalized Anxiety Score  Nervous, Anxious, on Edge 3 0 1 1  Control/stop worrying 3 0 0 0  Worry too much - different things 3 0 0 0  Trouble relaxing 3 0 0 0  Restless 1 0 0 0  Easily annoyed or irritable 3 0 1 0  Afraid - awful might happen 1 0 0 0  Total GAD 7 Score 17 0 2 1  Anxiety Difficulty Extremely difficult   Not difficult at all      History Aoki has a past medical history of Anal cancer ([place]) (01/10/2019), Arthritis, Depression, DVT (deep venous thrombosis) ([place]) (2013), Essential hypertension, Family history of breast cancer, Gait abnormality (08/03/2016), GERD (gastroesophageal reflux disease) (08/03/2020), History of blood transfusion (2013),  Hyperlipidemia, Hypothyroidism, Hypothyroidism, Multiple sclerosis ([place]), Neurogenic bladder (08/03/2016), Port-A-Cath in place (01/23/2019), PTSD (post-traumatic stress disorder), PTSD (post-traumatic stress disorder), Right foot drop (08/03/2016), Seasonal allergies, and Ulcer.   She has a past surgical history that includes Colon surgery; Gastric bypass (2010); Interstim Implant placement (2014); Portacath placement; Hemorrhoid surgery (N/A, 12/25/2018); Flexible sigmoidoscopy (N/A, 06/25/2019); Colonoscopy with propofol (N/A, 03/04/2020); Colonoscopy (N/A, 07/06/2020); Interstim Implant placement (N/A, 12/27/2020); Colonoscopy with propofol (N/A, 08/02/2021); Esophagogastroduodenoscopy (egd) with propofol (N/A, 08/17/2021); Esophageal dilation (N/A, 08/17/2021); and biopsy (08/17/2021).   Her family history includes Breast cancer (age of onset: 57) in her sister; Breast cancer (age of onset: 40) in her mother; Cancer in her paternal grandmother; Diabetes in her father, maternal grandmother, and paternal grandmother; Heart disease in her maternal grandfather, maternal grandmother, and paternal grandfather; Hypertension in her father and sister; Miscarriages / Stillbirths in her sister; Stroke in her paternal grandfather.She reports that she quit smoking about 12 years ago. Her smoking use included cigarettes. She started smoking about 30 years ago. She has a 22.50 pack-year smoking history. She has never used smokeless tobacco. She reports that she does not currently use alcohol. She reports that she does  not use drugs.    ROS Review of Systems  Constitutional: Negative.   HENT: Negative.    Eyes:  Negative for visual disturbance.  Respiratory:  Negative for shortness of breath.   Cardiovascular:  Negative for chest pain.  Gastrointestinal:  Negative for abdominal pain.  Musculoskeletal:  Negative for arthralgias.   Objective:  BP 97/64   Pulse (!) 117   Temp 98 F (36.7 C)   Ht 5' 5"  (1.651 m)   Wt 174  lb 3.2 oz (79 kg)   SpO2 94%   BMI 28.99 kg/m   BP Readings from Last 3 Encounters:  09/19/21 97/64  08/17/21 (!) 100/50  08/02/21 110/60    Wt Readings from Last 3 Encounters:  09/19/21 174 lb 3.2 oz (79 kg)  08/17/21 170 lb (77.1 kg)  08/02/21 170 lb (77.1 kg)     Physical Exam Constitutional:      General: She is not in acute distress.    Appearance: She is well-developed.  Cardiovascular:     Rate and Rhythm: Normal rate and regular rhythm.  Pulmonary:     Breath sounds: Normal breath sounds.  Musculoskeletal:        General: Normal range of motion.  Skin:    General: Skin is warm and dry.  Neurological:     Mental Status: She is alert and oriented to person, place, and time.      Assessment & Plan:   Adie was seen today for medical management of chronic issues.  Diagnoses and all orders for this visit:  Acquired hypothyroidism -     CBC with Differential/Platelet -     CMP14+EGFR -     TSH + free T4  Screening, lipid -     CBC with Differential/Platelet -     CMP14+EGFR -     Lipid panel  Insomnia, unspecified type -     CBC with Differential/Platelet -     CMP14+EGFR -     QUEtiapine (SEROQUEL) 50 MG tablet; Take 1 tablet (50 mg total) by mouth at bedtime.  Multiple sclerosis ([place]) -     CBC with Differential/Platelet -     CMP14+EGFR  Chest pain, precordial -     EKG 12-Lead -     Ambulatory referral to Cardiology       I have changed Tesha D. Blitzer's QUEtiapine. [name] Vitamin D3, vitamin B-12, multivitamin with minerals, Vitamin A, omeprazole, PARoxetine, mirabegron ER, apixaban, levothyroxine, famotidine, oxymetazoline, and Loratadine (ALLERGY RELIEF 24-HR PO).  Allergies as of 09/19/2021       Reactions   Tecfidera [dimethyl Fumarate] Itching        Medication List        Accurate as of Sep 19, 2021  5:11 PM. If you have any questions, ask your nurse or doctor.          ALLERGY RELIEF  24-HR PO Take 1 tablet by mouth daily.   apixaban 5 MG Tabs tablet Commonly known as: Eliquis Take 1 tablet (5 mg total) by mouth 2 (two) times daily.   famotidine 40 MG tablet Commonly known as: PEPCID Take 1 tablet (40 mg total) by mouth at bedtime as needed for heartburn or indigestion.   levothyroxine 112 MCG tablet Commonly known as: SYNTHROID TAKE 1 TABLET BY MOUTH DAILY BEFORE breakfast   mirabegron ER 50 MG Tb24 tablet Commonly known as: MYRBETRIQ Take 50 mg by mouth daily.   multivitamin with minerals  tablet Take 1 tablet by mouth daily. One a day woman   omeprazole 40 MG capsule Commonly known as: PRILOSEC Take 1 capsule (40 mg total) by mouth daily.   oxymetazoline 0.05 % nasal spray Commonly known as: AFRIN Place 1 spray into both nostrils 2 (two) times daily as needed for congestion.   PARoxetine 20 MG tablet Commonly known as: Paxil Take 1 tablet (20 mg total) by mouth daily.   QUEtiapine 50 MG tablet Commonly known as: SEROQUEL Take 1 tablet (50 mg total) by mouth at bedtime.   Vitamin A 2400 MCG (8000 UT) Caps Take 8,000 Units by mouth daily.   vitamin B-12 500 MCG tablet Commonly known as: CYANOCOBALAMIN Take 500 mcg by mouth daily.   Vitamin D3 125 MCG (5000 UT) Caps Take 5,000 Units by mouth every morning.         Follow-up: Return in about 3 months (around 12/20/2021).  [name], M.D.

## 2021-09-20 ENCOUNTER — Encounter (HOSPITAL_COMMUNITY): Payer: Self-pay | Admitting: Hematology

## 2021-09-20 LAB — CBC WITH DIFFERENTIAL/PLATELET
Basophils Absolute: 0 10*3/uL (ref 0.0–0.2)
Basos: 1 %
EOS (ABSOLUTE): 0.1 10*3/uL (ref 0.0–0.4)
Eos: 2 %
Hematocrit: 37.5 % (ref 34.0–46.6)
Hemoglobin: 12.5 g/dL (ref 11.1–15.9)
Immature Grans (Abs): 0 10*3/uL (ref 0.0–0.1)
Immature Granulocytes: 0 %
Lymphocytes Absolute: 0.8 10*3/uL (ref 0.7–3.1)
Lymphs: 11 %
MCH: 32.3 pg (ref 26.6–33.0)
MCHC: 33.3 g/dL (ref 31.5–35.7)
MCV: 97 fL (ref 79–97)
Monocytes Absolute: 0.8 10*3/uL (ref 0.1–0.9)
Monocytes: 11 %
Neutrophils Absolute: 5.6 10*3/uL (ref 1.4–7.0)
Neutrophils: 75 %
Platelets: 399 10*3/uL (ref 150–450)
RBC: 3.87 x10E6/uL (ref 3.77–5.28)
RDW: 12.3 % (ref 11.7–15.4)
WBC: 7.4 10*3/uL (ref 3.4–10.8)

## 2021-09-20 LAB — CMP14+EGFR
ALT: 20 IU/L (ref 0–32)
AST: 20 IU/L (ref 0–40)
Albumin/Globulin Ratio: 2.9 — ABNORMAL HIGH (ref 1.2–2.2)
Albumin: 4.1 g/dL (ref 3.8–4.8)
Alkaline Phosphatase: 186 IU/L — ABNORMAL HIGH (ref 44–121)
BUN/Creatinine Ratio: 8 — ABNORMAL LOW (ref 9–23)
BUN: 7 mg/dL (ref 6–24)
Bilirubin Total: 0.4 mg/dL (ref 0.0–1.2)
CO2: 22 mmol/L (ref 20–29)
Calcium: 10 mg/dL (ref 8.7–10.2)
Chloride: 105 mmol/L (ref 96–106)
Creatinine, Ser: 0.87 mg/dL (ref 0.57–1.00)
Globulin, Total: 1.4 g/dL — ABNORMAL LOW (ref 1.5–4.5)
Glucose: 68 mg/dL — ABNORMAL LOW (ref 70–99)
Potassium: 4.4 mmol/L (ref 3.5–5.2)
Sodium: 141 mmol/L (ref 134–144)
Total Protein: 5.5 g/dL — ABNORMAL LOW (ref 6.0–8.5)
eGFR: 84 mL/min/{1.73_m2} (ref >59)

## 2021-09-20 LAB — LIPID PANEL
Chol/HDL Ratio: 3.5 ratio (ref 0.0–4.4)
Cholesterol, Total: 244 mg/dL — ABNORMAL HIGH (ref 100–199)
HDL: 70 mg/dL (ref >39)
LDL Chol Calc (NIH): 149 mg/dL — ABNORMAL HIGH (ref 0–99)
Triglycerides: 141 mg/dL (ref 0–149)
VLDL Cholesterol Cal: 25 mg/dL (ref 5–40)

## 2021-09-20 LAB — TSH+FREE T4
Free T4: 1.55 ng/dL (ref 0.82–1.77)
TSH: 0.144 u[IU]/mL — ABNORMAL LOW (ref 0.450–4.500)

## 2021-09-20 NOTE — Progress Notes (Signed)
Hello [name],  Your lab result is normal and/or stable.Some minor variations that are not significant are commonly marked abnormal, but do not represent any medical problem for you.  Best regards, [name], M.D.

## 2021-09-22 ENCOUNTER — Ambulatory Visit (INDEPENDENT_AMBULATORY_CARE_PROVIDER_SITE_OTHER): Payer: Medicare Other | Admitting: Physician Assistant

## 2021-09-22 VITALS — BP 111/76 | HR 85 | Ht 65.0 in | Wt 173.0 lb

## 2021-09-22 DIAGNOSIS — N319 Neuromuscular dysfunction of bladder, unspecified: Secondary | ICD-10-CM | POA: Diagnosis not present

## 2021-09-22 DIAGNOSIS — R350 Frequency of micturition: Secondary | ICD-10-CM

## 2021-09-22 DIAGNOSIS — Z9682 Presence of neurostimulator: Secondary | ICD-10-CM

## 2021-09-22 DIAGNOSIS — N3000 Acute cystitis without hematuria: Secondary | ICD-10-CM | POA: Diagnosis not present

## 2021-09-22 LAB — URINALYSIS, ROUTINE W REFLEX MICROSCOPIC
Bilirubin, UA: NEGATIVE
Glucose, UA: NEGATIVE
Ketones, UA: NEGATIVE
Nitrite, UA: POSITIVE — AB
Specific Gravity, UA: 1.015 (ref 1.005–1.030)
Urobilinogen, Ur: 0.2 mg/dL (ref 0.2–1.0)
pH, UA: 7 (ref 5.0–7.5)

## 2021-09-22 LAB — MICROSCOPIC EXAMINATION
Renal Epithel, UA: NONE SEEN /hpf
WBC, UA: >30 /hpf — AB (ref 0–5)

## 2021-09-22 LAB — BLADDER SCAN AMB NON-IMAGING: Scan Result: 137

## 2021-09-22 MED ORDER — DOXYCYCLINE HYCLATE 100 MG PO CAPS: 100.0000 mg | ORAL_CAPSULE | Freq: Two times a day (BID) | ORAL | 0 refills | Status: DC

## 2021-09-22 NOTE — Progress Notes (Signed)
post void residual =16m
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
## 2021-09-26 LAB — URINE CULTURE

## 2021-10-03 ENCOUNTER — Ambulatory Visit (INDEPENDENT_AMBULATORY_CARE_PROVIDER_SITE_OTHER): Payer: Medicare Other

## 2021-10-03 VITALS — Ht 65.0 in | Wt 173.0 lb

## 2021-10-03 DIAGNOSIS — Z0001 Encounter for general adult medical examination with abnormal findings: Secondary | ICD-10-CM | POA: Diagnosis not present

## 2021-10-03 DIAGNOSIS — Z5941 Food insecurity: Secondary | ICD-10-CM

## 2021-10-03 DIAGNOSIS — I82403 Acute embolism and thrombosis of unspecified deep veins of lower extremity, bilateral: Secondary | ICD-10-CM | POA: Diagnosis not present

## 2021-10-03 DIAGNOSIS — Z599 Problem related to housing and economic circumstances, unspecified: Secondary | ICD-10-CM | POA: Diagnosis not present

## 2021-10-03 DIAGNOSIS — G35 Multiple sclerosis: Secondary | ICD-10-CM | POA: Diagnosis not present

## 2021-10-03 DIAGNOSIS — Z0000 Encounter for general adult medical examination without abnormal findings: Secondary | ICD-10-CM

## 2021-10-03 NOTE — Patient Instructions (Signed)
Amber Williams , Thank you for taking time to come for your Medicare Wellness Visit. I appreciate your ongoing commitment to your health goals. Please review the following plan we discussed and let me know if I can assist you in the future.   Screening recommendations/referrals: Colonoscopy: Done 08/02/2021 Repeat in 10 years  Mammogram: Done 01/20/2021 Repeat annually Pap: 06/01/2020. Repeat every 3 years. Bone Density: No due until age 46.  Recommended yearly ophthalmology/optometry visit for glaucoma screening and checkup Recommended yearly dental visit for hygiene and checkup  Vaccinations: Influenza vaccine: Done 01/17/2021 Repeat annually  Pneumococcal vaccine: Not due until age 61. Tdap vaccine: Done 06/20/2021. Repeat in 10 years  Shingles vaccine: Due at age 71.  Covid-19: Done 12/24/2019 and 01/14/2020.  Advanced directives: Please bring a copy of your health care power of attorney and living will to the office to be added to your chart at your convenience.   Conditions/risks identified: Aim for 30 minutes of exercise, 6-8 glasses of water, and 5 servings of fruits and vegetables each day.   Next appointment: Follow up in one year for your annual wellness visit. 2024.  Preventive Care 40-64 Years, Female Preventive care refers to lifestyle choices and visits with your health care provider that can promote health and wellness. What does preventive care include? A yearly physical exam. This is also called an annual well check. Dental exams once or twice a year. Routine eye exams. Ask your health care provider how often you should have your eyes checked. Personal lifestyle choices, including: Daily care of your teeth and gums. Regular physical activity. Eating a healthy diet. Avoiding tobacco and drug use. Limiting alcohol use. Practicing safe sex. Taking low-dose aspirin daily starting at age 75. Taking vitamin and mineral supplements as recommended by your health care  provider. What happens during an annual well check? The services and screenings done by your health care provider during your annual well check will depend on your age, overall health, lifestyle risk factors, and family history of disease. Counseling  Your health care provider may ask you questions about your: Alcohol use. Tobacco use. Drug use. Emotional well-being. Home and relationship well-being. Sexual activity. Eating habits. Work and work Statistician. Method of birth control. Menstrual cycle. Pregnancy history. Screening  You may have the following tests or measurements: Height, weight, and BMI. Blood pressure. Lipid and cholesterol levels. These may be checked every 5 years, or more frequently if you are over 78 years old. Skin check. Lung cancer screening. You may have this screening every year starting at age 76 if you have a 30-pack-year history of smoking and currently smoke or have quit within the past 15 years. Fecal occult blood test (FOBT) of the stool. You may have this test every year starting at age 19. Flexible sigmoidoscopy or colonoscopy. You may have a sigmoidoscopy every 5 years or a colonoscopy every 10 years starting at age 54. Hepatitis C blood test. Hepatitis B blood test. Sexually transmitted disease (STD) testing. Diabetes screening. This is done by checking your blood sugar (glucose) after you have not eaten for a while (fasting). You may have this done every 1-3 years. Mammogram. This may be done every 1-2 years. Talk to your health care provider about when you should start having regular mammograms. This may depend on whether you have a family history of breast cancer. BRCA-related cancer screening. This may be done if you have a family history of breast, ovarian, tubal, or peritoneal cancers. Pelvic exam and Pap  test. This may be done every 3 years starting at age 74. Starting at age 74, this may be done every 5 years if you have a Pap test in  combination with an HPV test. Bone density scan. This is done to screen for osteoporosis. You may have this scan if you are at high risk for osteoporosis. Discuss your test results, treatment options, and if necessary, the need for more tests with your health care provider. Vaccines  Your health care provider may recommend certain vaccines, such as: Influenza vaccine. This is recommended every year. Tetanus, diphtheria, and acellular pertussis (Tdap, Td) vaccine. You may need a Td booster every 10 years. Zoster vaccine. You may need this after age 6. Pneumococcal 13-valent conjugate (PCV13) vaccine. You may need this if you have certain conditions and were not previously vaccinated. Pneumococcal polysaccharide (PPSV23) vaccine. You may need one or two doses if you smoke cigarettes or if you have certain conditions. Talk to your health care provider about which screenings and vaccines you need and how often you need them. This information is not intended to replace advice given to you by your health care provider. Make sure you discuss any questions you have with your health care provider. Document Released: 05/14/2015 Document Revised: 01/05/2016 Document Reviewed: 02/16/2015 Elsevier Interactive Patient Education  2017 [employer] Prevention in the Home Falls can cause injuries. They can happen to people of all ages. There are many things you can do to make your home safe and to help prevent falls. What can I do on the outside of my home? Regularly fix the edges of walkways and driveways and fix any cracks. Remove anything that might make you trip as you walk through a door, such as a raised step or threshold. Trim any bushes or trees on the path to your home. Use bright outdoor lighting. Clear any walking paths of anything that might make someone trip, such as rocks or tools. Regularly check to see if handrails are loose or broken. Make sure that both sides of any steps have  handrails. Any raised decks and porches should have guardrails on the edges. Have any leaves, snow, or ice cleared regularly. Use sand or salt on walking paths during winter. Clean up any spills in your garage right away. This includes oil or grease spills. What can I do in the bathroom? Use night lights. Install grab bars by the toilet and in the tub and shower. Do not use towel bars as grab bars. Use non-skid mats or decals in the tub or shower. If you need to sit down in the shower, use a plastic, non-slip stool. Keep the floor dry. Clean up any water that spills on the floor as soon as it happens. Remove soap buildup in the tub or shower regularly. Attach bath mats securely with double-sided non-slip rug tape. Do not have throw rugs and other things on the floor that can make you trip. What can I do in the bedroom? Use night lights. Make sure that you have a light by your bed that is easy to reach. Do not use any sheets or blankets that are too big for your bed. They should not hang down onto the floor. Have a firm chair that has side arms. You can use this for support while you get dressed. Do not have throw rugs and other things on the floor that can make you trip. What can I do in the kitchen? Clean up any spills  right away. Avoid walking on wet floors. Keep items that you use a lot in easy-to-reach places. If you need to reach something above you, use a strong step stool that has a grab bar. Keep electrical cords out of the way. Do not use floor polish or wax that makes floors slippery. If you must use wax, use non-skid floor wax. Do not have throw rugs and other things on the floor that can make you trip. What can I do with my stairs? Do not leave any items on the stairs. Make sure that there are handrails on both sides of the stairs and use them. Fix handrails that are broken or loose. Make sure that handrails are as long as the stairways. Check any carpeting to make sure  that it is firmly attached to the stairs. Fix any carpet that is loose or worn. Avoid having throw rugs at the top or bottom of the stairs. If you do have throw rugs, attach them to the floor with carpet tape. Make sure that you have a light switch at the top of the stairs and the bottom of the stairs. If you do not have them, ask someone to add them for you. What else can I do to help prevent falls? Wear shoes that: Do not have high heels. Have rubber bottoms. Are comfortable and fit you well. Are closed at the toe. Do not wear sandals. If you use a stepladder: Make sure that it is fully opened. Do not climb a closed stepladder. Make sure that both sides of the stepladder are locked into place. Ask someone to hold it for you, if possible. Clearly mark and make sure that you can see: Any grab bars or handrails. First and last steps. Where the edge of each step is. Use tools that help you move around (mobility aids) if they are needed. These include: Canes. Walkers. Scooters. Crutches. Turn on the lights when you go into a dark area. Replace any light bulbs as soon as they burn out. Set up your furniture so you have a clear path. Avoid moving your furniture around. If any of your floors are uneven, fix them. If there are any pets around you, be aware of where they are. Review your medicines with your doctor. Some medicines can make you feel dizzy. This can increase your chance of falling. Ask your doctor what other things that you can do to help prevent falls. This information is not intended to replace advice given to you by your health care provider. Make sure you discuss any questions you have with your health care provider. Document Released: 02/11/2009 Document Revised: 09/23/2015 Document Reviewed: 05/22/2014 Elsevier Interactive Patient Education  2017 [employer].
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
## 2021-10-04 ENCOUNTER — Telehealth: Payer: Self-pay | Admitting: *Deleted

## 2021-10-04 ENCOUNTER — Telehealth: Payer: Self-pay

## 2021-10-04 NOTE — Chronic Care Management (AMB) (Signed)
  Chronic Care Management   Note  10/04/2021 Name: ALYVIA DERK MRN: [id number] DOB: [birth year]  Tawni Pummel Kolinski is a 46 y.o. year old female who is a primary care patient of [name], [name], MD. NALY SCHWANZ is currently enrolled in care management services. An additional referral for Pharm D  was placed.   Follow up plan: Unsuccessful telephone outreach attempt made. A HIPAA compliant phone message was left for the patient providing contact information and requesting a return call.  The care management team will reach out to the patient again over the next 7 days.  If patient returns call to provider office, please advise to call [facility]  at [place], [address], [place], [place] [address] Direct Dial: [phone] [email or web address]: [email or web address]

## 2021-10-05 ENCOUNTER — Telehealth: Payer: Self-pay

## 2021-10-05 NOTE — Telephone Encounter (Signed)
   Telephone encounter was:  Unsuccessful.  10/05/2021 Name: Amber Williams MRN: [id number] DOB: Sep 25, 1975  Unsuccessful outbound call made today to assist with:  Food Insecurity  Outreach Attempt:  1st Attempt  A HIPAA compliant voice message was left requesting a return call.  Instructed patient to call back at [phone].  Dixie Jafri, AAS Paralegal, [facility]  [address], [place] [address] ??[email or web address]  ?? [phone]   [email or web address]

## 2021-10-08 ENCOUNTER — Other Ambulatory Visit: Payer: Self-pay | Admitting: Nurse Practitioner

## 2021-10-10 ENCOUNTER — Other Ambulatory Visit: Payer: Self-pay | Admitting: Family Medicine

## 2021-10-11 ENCOUNTER — Other Ambulatory Visit: Payer: Self-pay

## 2021-10-11 NOTE — Progress Notes (Signed)
Opened in error

## 2021-10-12 NOTE — Chronic Care Management (AMB) (Signed)
  Chronic Care Management   Note  10/12/2021 Name: NATALLIE RAVENSCROFT MRN: [id number] DOB: 1976/03/28  Tawni Pummel Bohne is a 46 y.o. year old female who is a primary care patient of [name], [name], MD. I reached out to [name] by phone today in response to a referral sent by Ms. Rhen D Mander's PCP.  Ms. Sponsel was given information about Chronic Care Management services today including:  CCM service includes personalized support from designated clinical staff supervised by her physician, including individualized plan of care and coordination with other care providers 24/7 contact phone numbers for assistance for urgent and routine care needs. Service will only be billed when office clinical staff spend 20 minutes or more in a month to coordinate care. Only one practitioner may furnish and bill the service in a calendar month. The patient may stop CCM services at any time (effective at the end of the month) by phone call to the office staff. The patient is responsible for co-pay (up to 20% after annual deductible is met) if co-pay is required by the individual health plan.   Patient agreed to services and verbal consent obtained.   Follow up plan: Telephone appointment with care management team member scheduled for:11/11/2021  Noreene Larsson, [place], [place], [place] [address] Direct Dial: [phone] [email or web address] Website: [email or web address]

## 2021-10-17 ENCOUNTER — Telehealth: Payer: Self-pay

## 2021-10-17 NOTE — Telephone Encounter (Signed)
   Telephone encounter was:  Unsuccessful.  10/17/2021 Name: KYARRA VANCAMP MRN: [id number] DOB: [birth year]  Unsuccessful outbound call made today to assist with:  Food Insecurity  Outreach Attempt:  2nd Attempt  A HIPAA compliant voice message was left requesting a return call.  Instructed patient to call back at [phone].  Ansleigh Safer, AAS Paralegal, [facility]  [address], [place] [address] ??[email or web address]  ?? [phone]   [email or web address]

## 2021-10-19 ENCOUNTER — Telehealth: Payer: Self-pay

## 2021-10-19 NOTE — Telephone Encounter (Signed)
   Telephone encounter was:  Unsuccessful.  10/19/2021 Name: Amber Williams MRN: [id number] DOB: 02/03/1976  Unsuccessful outbound call made today to assist with:  Food Insecurity  Outreach Attempt:  3rd Attempt.  Referral closed unable to contact patient.  A HIPAA compliant voice message was left requesting a return call.  Instructed patient to call back at [phone].  [name], AAS Paralegal, [facility]  [address], [place] [address] ??[name]  ?? [phone]   [email or web address]

## 2021-10-28 DIAGNOSIS — R350 Frequency of micturition: Secondary | ICD-10-CM | POA: Diagnosis not present

## 2021-10-28 DIAGNOSIS — N390 Urinary tract infection, site not specified: Secondary | ICD-10-CM | POA: Diagnosis not present

## 2021-11-08 ENCOUNTER — Ambulatory Visit: Payer: Self-pay | Admitting: *Deleted

## 2021-11-08 ENCOUNTER — Encounter (HOSPITAL_COMMUNITY): Payer: Self-pay

## 2021-11-08 NOTE — Chronic Care Management (AMB) (Signed)
  Chronic Care Management   Note  11/08/2021 Name: Amber Williams MRN: [id number] DOB: 1976-03-04   Patient has not recently engaged with the Chronic Care Management RN Care Manager. Removing RN Care Manager from Care Team and closing [place]. If patient is currently engaged with another CCM team member I will forward this encounter to inform them of my case closure. Patient may be eligible for re-engagement with RN Care Manager in the future if necessary and can discuss this with their PCP.  [name], BSN, RN-BC Embedded Chronic Care Manager [facility] Direct Dial: [phone]

## 2021-11-09 ENCOUNTER — Encounter: Payer: Self-pay | Admitting: Urology

## 2021-11-11 ENCOUNTER — Ambulatory Visit: Payer: Medicare Other | Admitting: Pharmacist

## 2021-11-16 NOTE — Patient Instructions (Signed)
Your procedure is scheduled on: 11/24/2021  Report to [facility] at  10:45   AM.  Call this number if you have problems the morning of surgery: [phone]   Remember:   Do not Eat or Drink after midnight         No Smoking the morning of surgery  :  Take these medicines the morning of surgery with A SIP OF WATER: pepcid, levothyroxine, mirabegron, omeprazole, and paxil   Do not wear jewelry, make-up or nail polish.  Do not wear lotions, powders, or perfumes. You may wear deodorant.  Do not shave 48 hours prior to surgery. Men may shave face and neck.  Do not bring valuables to the hospital.  Contacts, dentures or bridgework may not be worn into surgery.  Leave suitcase in the car. After surgery it may be brought to your room.  For patients admitted to the hospital, checkout time is 11:00 AM the day of discharge.   Patients discharged the day of surgery will not be allowed to drive home.    Special Instructions: Shower using CHG night before surgery and shower the day of surgery use CHG.  Use special wash - you have one bottle of CHG for all showers.  You should use approximately 1/2 of the bottle for each shower.  How to Use Chlorhexidine for Bathing Chlorhexidine gluconate (CHG) is a germ-killing (antiseptic) solution that is used to clean the skin. It can get rid of the bacteria that normally live on the skin and can keep them away for about 24 hours. To clean your skin with CHG, you may be given: A CHG solution to use in the shower or as part of a sponge bath. A prepackaged cloth that contains CHG. Cleaning your skin with CHG may help lower the risk for infection: While you are staying in the intensive care unit of the hospital. If you have a vascular access, such as a central line, to provide short-term or long-term access to your veins. If you have a catheter to drain urine from your bladder. If you are on a ventilator. A ventilator is a machine that helps you  breathe by moving air in and out of your lungs. After surgery. What are the risks? Risks of using CHG include: A skin reaction. Hearing loss, if CHG gets in your ears and you have a perforated eardrum. Eye injury, if CHG gets in your eyes and is not rinsed out. The CHG product catching fire. Make sure that you avoid smoking and flames after applying CHG to your skin. Do not use CHG: If you have a chlorhexidine allergy or have previously reacted to chlorhexidine. On babies younger than 76 months of age. How to use CHG solution Use CHG only as told by your health care provider, and follow the instructions on the label. Use the full amount of CHG as directed. Usually, this is one bottle. During a shower Follow these steps when using CHG solution during a shower (unless your health care provider gives you different instructions): Start the shower. Use your normal soap and shampoo to wash your face and hair. Turn off the shower or move out of the shower stream. Pour the CHG onto a clean washcloth. Do not use any type of brush or rough-edged sponge. Starting at your neck, lather your body down to your toes. Make sure you follow these instructions: If you will be having surgery, pay special attention to the part of your body where you  will be having surgery. Scrub this area for at least 1 minute. Do not use CHG on your head or face. If the solution gets into your ears or eyes, rinse them well with water. Avoid your genital area. Avoid any areas of skin that have broken skin, cuts, or scrapes. Scrub your back and under your arms. Make sure to wash skin folds. Let the lather sit on your skin for 1-2 minutes or as long as told by your health care provider. Thoroughly rinse your entire body in the shower. Make sure that all body creases and crevices are rinsed well. Dry off with a clean towel. Do not put any substances on your body afterward--such as powder, lotion, or perfume--unless you are told  to do so by your health care provider. Only use lotions that are recommended by the manufacturer. Put on clean clothes or pajamas. If it is the night before your surgery, sleep in clean sheets.  During a sponge bath Follow these steps when using CHG solution during a sponge bath (unless your health care provider gives you different instructions): Use your normal soap and shampoo to wash your face and hair. Pour the CHG onto a clean washcloth. Starting at your neck, lather your body down to your toes. Make sure you follow these instructions: If you will be having surgery, pay special attention to the part of your body where you will be having surgery. Scrub this area for at least 1 minute. Do not use CHG on your head or face. If the solution gets into your ears or eyes, rinse them well with water. Avoid your genital area. Avoid any areas of skin that have broken skin, cuts, or scrapes. Scrub your back and under your arms. Make sure to wash skin folds. Let the lather sit on your skin for 1-2 minutes or as long as told by your health care provider. Using a different clean, wet washcloth, thoroughly rinse your entire body. Make sure that all body creases and crevices are rinsed well. Dry off with a clean towel. Do not put any substances on your body afterward--such as powder, lotion, or perfume--unless you are told to do so by your health care provider. Only use lotions that are recommended by the manufacturer. Put on clean clothes or pajamas. If it is the night before your surgery, sleep in clean sheets. How to use CHG prepackaged cloths Only use CHG cloths as told by your health care provider, and follow the instructions on the label. Use the CHG cloth on clean, dry skin. Do not use the CHG cloth on your head or face unless your health care provider tells you to. When washing with the CHG cloth: Avoid your genital area. Avoid any areas of skin that have broken skin, cuts, or scrapes. Before  surgery Follow these steps when using a CHG cloth to clean before surgery (unless your health care provider gives you different instructions): Using the CHG cloth, vigorously scrub the part of your body where you will be having surgery. Scrub using a back-and-forth motion for 3 minutes. The area on your body should be completely wet with CHG when you are done scrubbing. Do not rinse. Discard the cloth and let the area air-dry. Do not put any substances on the area afterward, such as powder, lotion, or perfume. Put on clean clothes or pajamas. If it is the night before your surgery, sleep in clean sheets.  For general bathing Follow these steps when using CHG cloths for general bathing (  unless your health care provider gives you different instructions). Use a separate CHG cloth for each area of your body. Make sure you wash between any folds of skin and between your fingers and toes. Wash your body in the following order, switching to a new cloth after each step: The front of your neck, shoulders, and chest. Both of your arms, under your arms, and your hands. Your stomach and groin area, avoiding the genitals. Your right leg and foot. Your left leg and foot. The back of your neck, your back, and your buttocks. Do not rinse. Discard the cloth and let the area air-dry. Do not put any substances on your body afterward--such as powder, lotion, or perfume--unless you are told to do so by your health care provider. Only use lotions that are recommended by the manufacturer. Put on clean clothes or pajamas. Contact a health care provider if: Your skin gets irritated after scrubbing. You have questions about using your solution or cloth. You swallow any chlorhexidine. Call your local poison control center (1-928-425-2603 in the [place] Get help right away if: Your eyes itch badly, or they become very red or swollen. Your skin itches badly and is red or swollen. Your hearing changes. You have trouble  seeing. You have swelling or tingling in your mouth or throat. You have trouble breathing. These symptoms may represent a serious problem that is an emergency. Do not wait to see if the symptoms will go away. Get medical help right away. Call your local emergency services (911 in the [place] Do not drive yourself to the hospital. Summary Chlorhexidine gluconate (CHG) is a germ-killing (antiseptic) solution that is used to clean the skin. Cleaning your skin with CHG may help to lower your risk for infection. You may be given CHG to use for bathing. It may be in a bottle or in a prepackaged cloth to use on your skin. Carefully follow your health care provider's instructions and the instructions on the product label. Do not use CHG if you have a chlorhexidine allergy. Contact your health care provider if your skin gets irritated after scrubbing. This information is not intended to replace advice given to you by your health care provider. Make sure you discuss any questions you have with your health care provider. Document Revised: 06/28/2020 Document Reviewed: 06/28/2020 Elsevier Patient Education  [place]. Sacral Nerve Stimulator Implantation, Care After The following information offers guidance on how to care for yourself after your procedure. Your health care provider may also give you more specific instructions. If you have problems or questions, contact your health care provider. What can I expect after the procedure? After the procedure, it is common to have soreness or pain in the incision area. Follow these instructions at home: Medicines Take over-the-counter and prescription medicines only as told by your health care provider. If you were prescribed an antibiotic medicine, take it as told by your health care provider. Do not stop using the antibiotic even if you start to feel better. Ask your health care provider if the medicine prescribed to you requires you to avoid driving or  using machinery. Incision care     Follow instructions from your health care provider about how to take care of your incisions. Make sure you: Wash your hands with soap and water for at least 20 seconds before and after you change your bandage (dressing). If soap and water are not available, use hand sanitizer. Change your dressing as told by your health care provider.  Leave stitches (sutures), staples, skin glue, or adhesive strips in place. These skin closures may need to stay in place for 2 weeks or longer. If adhesive strip edges start to loosen and curl up, you may trim the loose edges. Do not remove adhesive strips completely unless your health care provider tells you to do that. Check your incision area every day for signs of infection. Check for: Redness, swelling, or more pain. Fluid or blood. Warmth. Pus or a bad smell. Activity Follow instructions from your health care provider about any activity restrictions. You may need to avoid: Bending, twisting, or stretching. Having sex. Do not lift anything that is heavier than 10 lb (4.5 kg), or the limit that you are told, until your health care provider says that it is safe. Return to your normal activities as told by your health care provider. Ask your health care provider what activities are safe for you. Bathing Do not take baths, swim, or use a hot tub until your health care provider approves. Ask your health care provider if you may take showers. You may only be allowed to take sponge baths. Keep the dressing dry until your health care provider says it can be removed. Using the stimulator You will be given a remote control device. This device will allow you to turn your sacral nerve stimulator on and off. Follow instructions from your health care provider about how to use this device. Tell all of your health care providers that you have this device. Remind them that you have the device before they do any tests or  procedures. General instructions If you were given a sedative during the procedure, it can affect you for several hours. Do not drive or operate machinery until your health care provider says that it is safe. Keep all follow-up visits. This is important. Your health care provider may need to adjust the stimulator over several visits until it works well for you. Contact a health care provider if: Your device stops working. The device is not helping your symptoms. You have a fever or chills. You have pus or a bad smell coming from an incision. You have redness, swelling, or more pain around an incision. You have fluid or blood coming from an incision. An incision feels warm to the touch. Summary After the procedure, it is common to have soreness or pain in the incision area. Follow instructions from your health care provider about how to take care of your incision. Also, follow instructions about any activity restrictions. You may need to avoid activities that involve a lot of bending, twisting, or stretching. Tell all of your health care providers that you have this device. Remind them that you have the device before they do any tests or procedures. Contact your health care provider if your device stops working, or if it is not helping to treat your symptoms. Contact your health care provider if you have a fever, or if there is redness, warmth, swelling, pain, pus, or a bad smell in or around an incision. This information is not intended to replace advice given to you by your health care provider. Make sure you discuss any questions you have with your health care provider. Document Revised: 11/21/2019 Document Reviewed: 11/21/2019 Elsevier Patient Education  [place] Anesthesia, Adult, Care After This sheet gives you information about how to care for yourself after your procedure. Your health care provider may also give you more specific instructions. If you have problems or  questions, contact  your health care provider. What can I expect after the procedure? After the procedure, the following side effects are common: Pain or discomfort at the IV site. Nausea. Vomiting. Sore throat. Trouble concentrating. Feeling cold or chills. Feeling weak or tired. Sleepiness and fatigue. Soreness and body aches. These side effects can affect parts of the body that were not involved in surgery. Follow these instructions at home: For the time period you were told by your health care provider:  Rest. Do not participate in activities where you could fall or become injured. Do not drive or use machinery. Do not drink alcohol. Do not take sleeping pills or medicines that cause drowsiness. Do not make important decisions or sign legal documents. Do not take care of children on your own. Eating and drinking Follow any instructions from your health care provider about eating or drinking restrictions. When you feel hungry, start by eating small amounts of foods that are soft and easy to digest (bland), such as toast. Gradually return to your regular diet. Drink enough fluid to keep your urine pale yellow. If you vomit, rehydrate by drinking water, juice, or clear broth. General instructions If you have sleep apnea, surgery and certain medicines can increase your risk for breathing problems. Follow instructions from your health care provider about wearing your sleep device: Anytime you are sleeping, including during daytime naps. While taking prescription pain medicines, sleeping medicines, or medicines that make you drowsy. Have a responsible adult stay with you for the time you are told. It is important to have someone help care for you until you are awake and alert. Return to your normal activities as told by your health care provider. Ask your health care provider what activities are safe for you. Take over-the-counter and prescription medicines only as told by your health  care provider. If you smoke, do not smoke without supervision. Keep all follow-up visits as told by your health care provider. This is important. Contact a health care provider if: You have nausea or vomiting that does not get better with medicine. You cannot eat or drink without vomiting. You have pain that does not get better with medicine. You are unable to pass urine. You develop a skin rash. You have a fever. You have redness around your IV site that gets worse. Get help right away if: You have difficulty breathing. You have chest pain. You have blood in your urine or stool, or you vomit blood. Summary After the procedure, it is common to have a sore throat or nausea. It is also common to feel tired. Have a responsible adult stay with you for the time you are told. It is important to have someone help care for you until you are awake and alert. When you feel hungry, start by eating small amounts of foods that are soft and easy to digest (bland), such as toast. Gradually return to your regular diet. Drink enough fluid to keep your urine pale yellow. Return to your normal activities as told by your health care provider. Ask your health care provider what activities are safe for you. This information is not intended to replace advice given to you by your health care provider. Make sure you discuss any questions you have with your health care provider. Document Revised: 01/01/2020 Document Reviewed: 07/31/2019 Elsevier Patient Education  [place].

## 2021-11-17 NOTE — Progress Notes (Signed)
  Care Management   Follow Up Note   11/11/2021 Name: Amber Williams MRN: [id number] DOB: [id number]   Referred by: [name], MD Reason for referral : Chronic Care Management  Assisted patient with Extra Help/[employer] medicare application via [place] website in attempts to lower patients medication cost.  She has difficulty affording brand name Eliquis & Myrbetriq  Follow Up Plan: The patient has been provided with contact information for the care management team and has been advised to call with any health related questions or concerns.      Regina Eck, PharmD, BCPS Clinical Pharmacist, [place]  II Phone [phone]

## 2021-11-21 ENCOUNTER — Encounter (HOSPITAL_COMMUNITY)
Admission: RE | Admit: 2021-11-21 | Discharge: 2021-11-21 | Disposition: A | Discharge status: Home / Self Care | Payer: Medicare Other | Source: Ambulatory Visit | Attending: Urology | Admitting: Urology

## 2021-11-21 VITALS — BP 123/68 | HR 98 | Temp 97.7°F | Resp 18 | Ht 65.0 in | Wt 173.1 lb

## 2021-11-21 DIAGNOSIS — Z01812 Encounter for preprocedural laboratory examination: Secondary | ICD-10-CM | POA: Diagnosis not present

## 2021-11-21 DIAGNOSIS — Z01818 Encounter for other preprocedural examination: Secondary | ICD-10-CM

## 2021-11-21 LAB — POCT PREGNANCY, URINE: Preg Test, Ur: NEGATIVE

## 2021-11-22 ENCOUNTER — Ambulatory Visit (INDEPENDENT_AMBULATORY_CARE_PROVIDER_SITE_OTHER): Payer: Medicare Other | Admitting: Licensed Clinical Social Worker

## 2021-11-22 DIAGNOSIS — G4700 Insomnia, unspecified: Secondary | ICD-10-CM

## 2021-11-22 DIAGNOSIS — R1319 Other dysphagia: Secondary | ICD-10-CM

## 2021-11-22 DIAGNOSIS — F4310 Post-traumatic stress disorder, unspecified: Secondary | ICD-10-CM

## 2021-11-22 DIAGNOSIS — E039 Hypothyroidism, unspecified: Secondary | ICD-10-CM

## 2021-11-22 DIAGNOSIS — G35 Multiple sclerosis: Secondary | ICD-10-CM

## 2021-11-22 NOTE — Chronic Care Management (AMB) (Signed)
Chronic Care Management    Clinical Social Work Note  11/22/2021 Name: Amber Williams MRN: [id number] DOB: [birth year]  Amber Williams is a 46 y.o. year old female who is a primary care patient of [name], [name], MD. The CCM team was consulted to assist the patient with chronic disease management and/or care coordination needs related to: [employer] .   Engaged with patient by telephone for follow up visit in response to provider referral for social work chronic care management and care coordination services.   Consent to Services:  The patient was given information about Chronic Care Management services, agreed to services, and gave verbal consent prior to initiation of services.  Please see initial visit note for detailed documentation.   Patient agreed to services and consent obtained.   Assessment: Review of patient past medical history, allergies, medications, and health status, including review of relevant consultants reports was performed today as part of a comprehensive evaluation and provision of chronic care management and care coordination services.     SDOH (Social Determinants of Health) assessments and interventions performed:  SDOH Interventions    Flowsheet Row Most Recent Value  SDOH Interventions   Food Insecurity Interventions Other (Comment) food scarcity challenges  Stress Interventions Provide Counseling  [client has stress related to financial issues,  has stress related to managing medical needs]        Advanced Directives Status: See [employer] application for related entries.  CCM Care Plan  Allergies  Allergen Reactions   Tecfidera [Dimethyl Fumarate] Itching    Outpatient Encounter Medications as of 11/22/2021  Medication Sig   apixaban (ELIQUIS) 5 MG TABS tablet Take 1 tablet (5 mg total) by mouth 2 (two) times daily.   Ascorbic Acid (VITAMIN C) 1000 MG tablet Take 1,000 mg by mouth daily.   Cholecalciferol (VITAMIN D3) 5000 units CAPS  Take 5,000 Units by mouth every morning.   Cyanocobalamin 5000 MCG TBDP Take 5,000 mcg by mouth daily.   famotidine (PEPCID) 40 MG tablet Take 1 tablet (40 mg total) by mouth at bedtime as needed for heartburn or indigestion.   levothyroxine (SYNTHROID) 112 MCG tablet TAKE 1 TABLET BY MOUTH DAILY BEFORE breakfast   mirabegron ER (MYRBETRIQ) 50 MG TB24 tablet Take 50 mg by mouth daily.   Multiple Vitamins-Minerals (MULTIVITAMIN WITH MINERALS) tablet Take 1 tablet by mouth daily. One a day woman   omeprazole (PRILOSEC) 40 MG capsule TAKE ONE CAPSULE BY MOUTH EVERY DAY   oxymetazoline (AFRIN) 0.05 % nasal spray Place 1 spray into both nostrils 2 (two) times daily as needed for congestion.   PARoxetine (PAXIL) 20 MG tablet Take 1 tablet (20 mg total) by mouth daily.   QUEtiapine (SEROQUEL) 50 MG tablet Take 1 tablet (50 mg total) by mouth at bedtime.   Vitamin A 2400 MCG (8000 UT) CAPS Take 8,000 Units by mouth daily.   No facility-administered encounter medications on file as of 11/22/2021.    Patient Active Problem List   Diagnosis Date Noted   Dysphagia 07/18/2021   Recurrent acute deep vein thrombosis (DVT) of both lower extremities ([place]) 05/09/2021   Other dysphagia 03/30/2021   Acute cystitis without hematuria 09/16/2020   Leukocytosis 09/16/2020   Pressure injury of sacral region, stage 2 ([place]) 09/16/2020   Candidal intertrigo 08/03/2020   GERD (gastroesophageal reflux disease) 08/03/2020   Cellulitis of abdominal wall 08/02/2020   Dark red stool    History of rectal or anal cancer    Genetic testing 08/11/2019  Family history of breast cancer    Port-A-Cath in place 01/23/2019   Anal cancer ([place]) 01/10/2019   Anal condyloma    Hemorrhoids, external, with complication 62/83/6629   Neurogenic bladder 08/03/2016   Gait abnormality 08/03/2016   Right foot drop 08/03/2016   Hypothyroidism 01/31/2016   Insomnia 01/31/2016   Multiple sclerosis ([place]) 01/31/2016   PTSD  (post-traumatic stress disorder) 01/31/2016   H/O gastric bypass 07/26/2011   Depression 01/16/2011    Conditions to be addressed/monitored: monitor anxiety issues of client; monitor stress issues of client  Care Plan : [place]  Updates made by Katha Cabal, LCSW since 11/22/2021 12:00 AM     Problem: Emotional Distress      Goal: Manage Anxiety and Stress Issues Faced   Start Date: 07/28/2020  Expected End Date: 12/22/2021  This Visit's Progress: On track  Recent Progress: On track  Priority: Medium  Note:   Current Barriers:  Chronic Mental Health needs related to anxiety and stress management Mobility issues Suicidal Ideation/Homicidal Ideation: No  Clinical Social Work Goal(s):  patient will work with SW monthly by telephone or in person to reduce or manage symptoms related to anxiety and stress issues faced Patient will communicate with LCSW in next 30 days to discuss mobility issues of client  Interventions: 1:1 collaboration with [name], MD regarding development and update of comprehensive plan of care as evidenced by provider attestation and co-signature LCSW discussed client needs with Amber Williams Discussed sleeping issues of client. Discussed appetite of client. Reviewed medication procurement of client LCSW discussed client needs. Client and LCSW agreed for LCSW to discharge client today from [facility]. LCSW spoke with client about Care Coordination Services through [facility]. Client knows she can talk with PCP about referral for Care Coordination Services if she desires such support LCSW thanked client for participation in CCM SW services support  Patient Self Care Activities:  Takes medications as prescribed Attends scheduled medical appointments  Patient Coping Strengths:  Has support from her parents Has transport help as needed Takes time to rest and relax as needed  Patient Self Care Deficits:  Mobility issues Daily challenges  with managing MS symptoms faced  Patient Goals:  - spend time or talk with others every day - practice relaxation or meditation daily - keep a calendar with appointment dates  Follow Up Plan: LCSW is discharging client today from [facility]. Client agreed to this plan.      [name].Cambrie Sonnenfeld MSW, [place] Holiday representative [facility] Care Management [phone]

## 2021-11-22 NOTE — Patient Instructions (Addendum)
Visit Information  Patient goals:  Manage anxiety and stress issues faced  Timeframe: Short-Term   This Visit's Progress: On Track   Priority: Medium   Start Date  07/28/20  Expected End Date  12/25/21    Follow Up Plan:  LCSW is discharging client today from [facility]  Patient Self Care Activities:  Takes medications as prescribed Attends scheduled medical appointments  Patient Coping Strengths:  Has support from her parents Has transport help as needed Takes time to rest and relax as needed  Patient Self Care Deficits:  Mobility issues Daily challenges with managing MS symptoms faced  Patient Goals:  - spend time or talk with others every day - practice relaxation or meditation daily - keep a calendar with appointment dates  Follow Up Plan: LCSW is discharging client today from [place].  [name].Karl Erway MSW, Lerna Holiday representative [facility] [phone]

## 2021-11-24 ENCOUNTER — Ambulatory Visit (HOSPITAL_COMMUNITY): Payer: Medicare Other | Admitting: Certified Registered"

## 2021-11-24 ENCOUNTER — Other Ambulatory Visit (HOSPITAL_COMMUNITY): Payer: Self-pay | Admitting: Hematology

## 2021-11-24 ENCOUNTER — Encounter (HOSPITAL_COMMUNITY)
Admission: RE | Disposition: A | Discharge status: Home / Self Care | Payer: Self-pay | Source: Ambulatory Visit | Attending: Urology

## 2021-11-24 ENCOUNTER — Ambulatory Visit (HOSPITAL_BASED_OUTPATIENT_CLINIC_OR_DEPARTMENT_OTHER): Payer: Medicare Other | Admitting: Certified Registered"

## 2021-11-24 ENCOUNTER — Other Ambulatory Visit: Payer: Self-pay

## 2021-11-24 ENCOUNTER — Ambulatory Visit (HOSPITAL_COMMUNITY)
Admission: RE | Admit: 2021-11-24 | Discharge: 2021-11-24 | Disposition: A | Discharge status: Home / Self Care | Payer: Medicare Other | Source: Ambulatory Visit | Attending: Urology | Admitting: Urology

## 2021-11-24 ENCOUNTER — Ambulatory Visit (HOSPITAL_COMMUNITY): Payer: Medicare Other

## 2021-11-24 ENCOUNTER — Encounter (HOSPITAL_COMMUNITY): Payer: Self-pay | Admitting: Urology

## 2021-11-24 DIAGNOSIS — I10 Essential (primary) hypertension: Secondary | ICD-10-CM | POA: Insufficient documentation

## 2021-11-24 DIAGNOSIS — M549 Dorsalgia, unspecified: Secondary | ICD-10-CM | POA: Diagnosis not present

## 2021-11-24 DIAGNOSIS — N319 Neuromuscular dysfunction of bladder, unspecified: Secondary | ICD-10-CM | POA: Insufficient documentation

## 2021-11-24 DIAGNOSIS — Z87891 Personal history of nicotine dependence: Secondary | ICD-10-CM | POA: Insufficient documentation

## 2021-11-24 DIAGNOSIS — E039 Hypothyroidism, unspecified: Secondary | ICD-10-CM | POA: Diagnosis not present

## 2021-11-24 DIAGNOSIS — K219 Gastro-esophageal reflux disease without esophagitis: Secondary | ICD-10-CM | POA: Diagnosis not present

## 2021-11-24 DIAGNOSIS — T83190A Other mechanical complication of urinary electronic stimulator device, initial encounter: Secondary | ICD-10-CM | POA: Diagnosis not present

## 2021-11-24 DIAGNOSIS — T85840A Pain due to nervous system prosthetic devices, implants and grafts, initial encounter: Secondary | ICD-10-CM | POA: Diagnosis not present

## 2021-11-24 DIAGNOSIS — T888XXA Other specified complications of surgical and medical care, not elsewhere classified, initial encounter: Secondary | ICD-10-CM | POA: Diagnosis not present

## 2021-11-24 DIAGNOSIS — T8384XA Pain from genitourinary prosthetic devices, implants and grafts, initial encounter: Secondary | ICD-10-CM | POA: Diagnosis not present

## 2021-11-24 DIAGNOSIS — Z01818 Encounter for other preprocedural examination: Secondary | ICD-10-CM

## 2021-11-24 HISTORY — PX: INTERSTIM IMPLANT REVISION: SHX5138

## 2021-11-24 SURGERY — REVISION, SACRAL NERVE STIMULATOR, INTERSTIM
Anesthesia: General | Site: Bladder

## 2021-11-24 MED ORDER — PROPOFOL 10 MG/ML IV BOLUS
INTRAVENOUS | Status: DC | PRN
  Administered 2021-11-24: 120 mg via INTRAVENOUS

## 2021-11-24 MED ORDER — LACTATED RINGERS IV SOLN: INTRAVENOUS | Status: DC

## 2021-11-24 MED ORDER — LIDOCAINE HCL (PF) 2 % IJ SOLN
INTRAMUSCULAR | Status: AC
  Filled 2021-11-24: qty 5

## 2021-11-24 MED ORDER — ONDANSETRON HCL 4 MG/2ML IJ SOLN: 4.0000 mg | Freq: Once | INTRAMUSCULAR | Status: DC | PRN

## 2021-11-24 MED ORDER — CEFAZOLIN SODIUM-DEXTROSE 2-4 GM/100ML-% IV SOLN
2.0000 g | INTRAVENOUS | Status: AC
  Administered 2021-11-24: 2 g via INTRAVENOUS

## 2021-11-24 MED ORDER — FENTANYL CITRATE (PF) 100 MCG/2ML IJ SOLN
INTRAMUSCULAR | Status: AC
  Filled 2021-11-24: qty 2

## 2021-11-24 MED ORDER — LIDOCAINE-EPINEPHRINE (PF) 1 %-1:200000 IJ SOLN
INTRAMUSCULAR | Status: DC | PRN
  Administered 2021-11-24: 10 mL

## 2021-11-24 MED ORDER — ORAL CARE MOUTH RINSE: 15.0000 mL | Freq: Once | OROMUCOSAL | Status: DC

## 2021-11-24 MED ORDER — CEFAZOLIN SODIUM-DEXTROSE 2-4 GM/100ML-% IV SOLN
INTRAVENOUS | Status: AC
  Filled 2021-11-24: qty 100

## 2021-11-24 MED ORDER — CHLORHEXIDINE GLUCONATE 0.12 % MT SOLN: 15.0000 mL | Freq: Once | OROMUCOSAL | Status: DC

## 2021-11-24 MED ORDER — ROCURONIUM 10MG/ML (10ML) SYRINGE FOR MEDFUSION PUMP - OPTIME
INTRAVENOUS | Status: DC | PRN
  Administered 2021-11-24: 40 mg via INTRAVENOUS

## 2021-11-24 MED ORDER — MIDAZOLAM HCL 5 MG/5ML IJ SOLN
INTRAMUSCULAR | Status: DC | PRN
  Administered 2021-11-24: 1 mg via INTRAVENOUS
  Administered 2021-11-24: 2 mg via INTRAVENOUS

## 2021-11-24 MED ORDER — PHENYLEPHRINE 80 MCG/ML (10ML) SYRINGE FOR IV PUSH (FOR BLOOD PRESSURE SUPPORT)
PREFILLED_SYRINGE | INTRAVENOUS | Status: AC
  Filled 2021-11-24: qty 10

## 2021-11-24 MED ORDER — FENTANYL CITRATE PF 50 MCG/ML IJ SOSY: 25.0000 ug | PREFILLED_SYRINGE | INTRAMUSCULAR | Status: DC | PRN

## 2021-11-24 MED ORDER — SUGAMMADEX SODIUM 200 MG/2ML IV SOLN
INTRAVENOUS | Status: DC | PRN
  Administered 2021-11-24: 150 mg via INTRAVENOUS

## 2021-11-24 MED ORDER — FENTANYL CITRATE (PF) 100 MCG/2ML IJ SOLN
INTRAMUSCULAR | Status: DC | PRN
  Administered 2021-11-24: 125 ug via INTRAVENOUS
  Administered 2021-11-24: 75 ug via INTRAVENOUS

## 2021-11-24 MED ORDER — LACTATED RINGERS IV SOLN: INTRAVENOUS | Status: DC | PRN

## 2021-11-24 MED ORDER — PROPOFOL 10 MG/ML IV BOLUS
INTRAVENOUS | Status: AC
  Filled 2021-11-24: qty 20

## 2021-11-24 MED ORDER — MIDAZOLAM HCL 2 MG/2ML IJ SOLN
INTRAMUSCULAR | Status: AC
  Filled 2021-11-24: qty 2

## 2021-11-24 MED ORDER — 0.9 % SODIUM CHLORIDE (POUR BTL) OPTIME
TOPICAL | Status: DC | PRN
  Administered 2021-11-24: 500 mL

## 2021-11-24 MED ORDER — LIDOCAINE-EPINEPHRINE (PF) 1 %-1:200000 IJ SOLN
INTRAMUSCULAR | Status: AC
  Filled 2021-11-24: qty 30

## 2021-11-24 MED ORDER — OXYCODONE-ACETAMINOPHEN 5-325 MG PO TABS: 1.0000 | ORAL_TABLET | ORAL | 0 refills | Status: DC | PRN

## 2021-11-24 MED ORDER — LIDOCAINE HCL (CARDIAC) PF 50 MG/5ML IV SOSY
PREFILLED_SYRINGE | INTRAVENOUS | Status: DC | PRN
  Administered 2021-11-24: 60 mg via INTRAVENOUS

## 2021-11-24 MED ORDER — ONDANSETRON HCL 4 MG/2ML IJ SOLN
INTRAMUSCULAR | Status: AC
  Filled 2021-11-24: qty 2

## 2021-11-24 SURGICAL SUPPLY — 32 items
ADH SKN CLS APL DERMABOND .7 (GAUZE/BANDAGES/DRESSINGS) ×1
APL PRP STRL LF DISP 70% ISPRP (MISCELLANEOUS) ×1
CHLORAPREP W/TINT 26 (MISCELLANEOUS) ×2 IMPLANT
COVER LIGHT HANDLE STERIS (MISCELLANEOUS) ×4 IMPLANT
DERMABOND ADVANCED (GAUZE/BANDAGES/DRESSINGS) ×1
DERMABOND ADVANCED .7 DNX12 (GAUZE/BANDAGES/DRESSINGS) ×1 IMPLANT
DRAPE C-ARM FOLDED MOBILE STRL (DRAPES) ×2 IMPLANT
DRAPE HALF SHEET 40X57 (DRAPES) ×2 IMPLANT
DRAPE INCISE IOBAN 66X45 STRL (DRAPES) ×2 IMPLANT
DRAPE LAPAROSCOPIC ABDOMINAL (DRAPES) ×2 IMPLANT
ELECT REM PT RETURN 9FT ADLT (ELECTROSURGICAL) ×2
ELECTRODE REM PT RTRN 9FT ADLT (ELECTROSURGICAL) ×1 IMPLANT
GLOVE BIO SURGEON STRL SZ7 (GLOVE) ×1 IMPLANT
GLOVE BIO SURGEON STRL SZ8 (GLOVE) ×2 IMPLANT
GLOVE BIOGEL PI IND STRL 7.0 (GLOVE) ×2 IMPLANT
GLOVE BIOGEL PI IND STRL 8 (GLOVE) IMPLANT
GLOVE BIOGEL PI INDICATOR 7.0 (GLOVE) ×2
GLOVE BIOGEL PI INDICATOR 8 (GLOVE) ×1
GOWN STRL REUS W/TWL LRG LVL3 (GOWN DISPOSABLE) ×2 IMPLANT
GOWN STRL REUS W/TWL XL LVL3 (GOWN DISPOSABLE) ×2 IMPLANT
KIT TURNOVER CYSTO (KITS) ×2 IMPLANT
MANIFOLD NEPTUNE II (INSTRUMENTS) ×1 IMPLANT
NDL HYPO 21X1.5 SAFETY (NEEDLE) ×1 IMPLANT
NEEDLE HYPO 21X1.5 SAFETY (NEEDLE) ×2 IMPLANT
PACK MINOR (CUSTOM PROCEDURE TRAY) ×2 IMPLANT
PAD ARMBOARD 7.5X6 YLW CONV (MISCELLANEOUS) ×2 IMPLANT
SET BASIN LINEN APH (SET/KITS/TRAYS/PACK) ×2 IMPLANT
SUT MNCRL AB 4-0 PS2 18 (SUTURE) ×2 IMPLANT
SUT VIC AB 3-0 SH 27 (SUTURE) ×2
SUT VIC AB 3-0 SH 27X BRD (SUTURE) ×1 IMPLANT
SYR BULB IRRIG 60ML STRL (SYRINGE) ×2 IMPLANT
SYR CONTROL 10ML LL (SYRINGE) ×2 IMPLANT

## 2021-11-24 NOTE — Op Note (Signed)
Preoperative diagnosis: Neurogenic bladder, back pain   Postoperative diagnosis: Same   Procedure: 1. Removal of Interstim lead and battery   Surgeon: Dr. Nicolette Bang   Assistant: None   Antibiotics: Ancef   Drains: None   Indications: The patient is a 46yo with a hx of neurogenic bladder who currently has an interstim in place which iwas causing significant pain. After discussing treatment options she has elected to proceed with Interstim removal.   Procedure in detail: Prior to the procedure consent was obtained. The patient was brought to the operating room and a brief time out was completed to ensure correct patient, correct procedure and correct site. Preoperative antibiotics were given. Extra care was taken positioning the patient in a prone position. Usual skin preparation was utilized.     A 4 cm right upper buttock incision was made over the previus battery. 10 mL of a lidocaine epinephrine mixture was utilized prior to incision. It was carried down to appropriate depth of the battery. The battery was then brought into the operative field. We then marked the lead insertion into the sacrum with fluoroscopy. We then injected 5cc of lidocaine over the site and then made a 1.5cm incision. We dissected down to the lead. Using gentle traction the lead was removed intact. We then cut the lead and removed both the lead and the battery. Repeat fluoroscopy of the pelvis indicated not retained fragments of the interstim. We then closed the incisions with running 4-0 monocryl. We then placed dermabond on the incisions and this concluded the procedure which was well tolerated by the patient.   Complications: none   Condition: stable, transferred to PACU   Plan: The patient is to be discharged home and followup in 1-2 weeks for a wound check

## 2021-11-24 NOTE — Anesthesia Preprocedure Evaluation (Signed)
Anesthesia Evaluation  Patient identified by MRN, date of birth, ID band Patient awake    Reviewed: Allergy & Precautions, H&P , NPO status , Patient's Chart, lab work & pertinent test results, reviewed documented beta blocker date and time   Airway Mallampati: II  TM Distance: >3 FB Neck ROM: full    Dental no notable dental hx.    Pulmonary neg pulmonary ROS, former smoker,    Pulmonary exam normal breath sounds clear to auscultation       Cardiovascular Exercise Tolerance: Good hypertension, negative cardio ROS   Rhythm:regular Rate:Normal     Neuro/Psych PSYCHIATRIC DISORDERS Anxiety Depression  Neuromuscular disease    GI/Hepatic Neg liver ROS, GERD  Medicated,  Endo/Other  Hypothyroidism   Renal/GU negative Renal ROS  negative genitourinary   Musculoskeletal   Abdominal   Peds  Hematology negative hematology ROS (+)   Anesthesia Other Findings   Reproductive/Obstetrics negative OB ROS                             Anesthesia Physical Anesthesia Plan  ASA: 2  Anesthesia Plan: General and General LMA   Post-op Pain Management:    Induction:   PONV Risk Score and Plan:   Airway Management Planned:   Additional Equipment:   Intra-op Plan:   Post-operative Plan:   Informed Consent: I have reviewed the patients History and Physical, chart, labs and discussed the procedure including the risks, benefits and alternatives for the proposed anesthesia with the patient or authorized representative who has indicated his/her understanding and acceptance.     Dental Advisory Given  Plan Discussed with: CRNA  Anesthesia Plan Comments:         Anesthesia Quick Evaluation

## 2021-11-24 NOTE — Transfer of Care (Signed)
Immediate Anesthesia Transfer of Care Note  Patient: [name]  Procedure(s) Performed: [name] OF INTERSTIM- removal (Bladder)  Patient Location: PACU  Anesthesia Type:General  Level of Consciousness: awake  Airway & Oxygen Therapy: Patient Spontanous Breathing  Post-op Assessment: Report given to RN and Post -op Vital signs reviewed and stable  Post vital signs: Reviewed and stable  Last Vitals:  Vitals Value Taken Time  BP 115/90 11/24/21 1321  Temp 36.4 C 11/24/21 1321  Pulse 94 11/24/21 1325  Resp 17 11/24/21 1325  SpO2 92 % 11/24/21 1325  Vitals shown include unvalidated device data.  Last Pain:  Vitals:   11/24/21 1104  PainSc: 5          Complications: No notable events documented.

## 2021-11-24 NOTE — Progress Notes (Signed)
Instructed patient to apply warm compresses to left anterior forearm and report any further complications or worsening symptoms with the hospital.

## 2021-11-24 NOTE — Anesthesia Procedure Notes (Signed)
Procedure Name: Intubation Date/Time: 11/24/2021 12:15 PM  Performed by: Ollen Bowl, CRNAPre-anesthesia Checklist: Patient identified, Patient being monitored, Timeout performed, Emergency Drugs available and Suction available Patient Re-evaluated:Patient Re-evaluated prior to induction Oxygen Delivery Method: Circle system utilized Preoxygenation: Pre-oxygenation with 100% oxygen Induction Type: IV induction Ventilation: Mask ventilation without difficulty Laryngoscope Size: Mac and 3 Grade View: Grade I Tube type: Oral Tube size: 7.0 mm Number of attempts: 1 Airway Equipment and Method: Stylet Placement Confirmation: ETT inserted through vocal cords under direct vision, positive ETCO2 and breath sounds checked- equal and bilateral Secured at: 21 cm Tube secured with: Tape Dental Injury: Teeth and Oropharynx as per pre-operative assessment
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
## 2021-11-25 ENCOUNTER — Encounter (HOSPITAL_COMMUNITY): Payer: Self-pay | Admitting: Urology

## 2021-11-25 NOTE — Anesthesia Postprocedure Evaluation (Signed)
Anesthesia Post Note  Patient: Amber Williams  Procedure(s) Performed: [name] OF INTERSTIM- removal (Bladder)  Patient location during evaluation: Phase II Anesthesia Type: General Level of consciousness: awake Pain management: pain level controlled Vital Signs Assessment: post-procedure vital signs reviewed and stable Respiratory status: spontaneous breathing and respiratory function stable Cardiovascular status: blood pressure returned to baseline and stable Postop Assessment: no headache and no apparent nausea or vomiting Anesthetic complications: no Comments: Late entry   No notable events documented.   Last Vitals:  Vitals:   11/24/21 1400 11/24/21 1412  BP: 115/64 122/69  Pulse: 95 88  Resp: 18 16  Temp:  (!) 36.4 C  SpO2: 97% 96%    Last Pain:  Vitals:   11/24/21 1412  TempSrc: Oral  PainSc: 0-No pain                 Louann Sjogren

## 2021-11-28 DIAGNOSIS — G4700 Insomnia, unspecified: Secondary | ICD-10-CM | POA: Diagnosis not present

## 2021-11-28 DIAGNOSIS — E039 Hypothyroidism, unspecified: Secondary | ICD-10-CM | POA: Diagnosis not present

## 2021-11-28 DIAGNOSIS — F4310 Post-traumatic stress disorder, unspecified: Secondary | ICD-10-CM

## 2021-11-28 DIAGNOSIS — R1319 Other dysphagia: Secondary | ICD-10-CM | POA: Diagnosis not present

## 2021-11-28 DIAGNOSIS — G35 Multiple sclerosis: Secondary | ICD-10-CM

## 2021-11-28 LAB — SURGICAL PATHOLOGY

## 2021-11-30 DIAGNOSIS — G35 Multiple sclerosis: Secondary | ICD-10-CM | POA: Diagnosis not present

## 2021-12-01 DIAGNOSIS — R5383 Other fatigue: Secondary | ICD-10-CM | POA: Diagnosis not present

## 2021-12-01 DIAGNOSIS — M6281 Muscle weakness (generalized): Secondary | ICD-10-CM | POA: Diagnosis not present

## 2021-12-01 DIAGNOSIS — G35 Multiple sclerosis: Secondary | ICD-10-CM | POA: Diagnosis not present

## 2021-12-01 DIAGNOSIS — M21371 Foot drop, right foot: Secondary | ICD-10-CM | POA: Diagnosis not present

## 2021-12-07 ENCOUNTER — Ambulatory Visit (INDEPENDENT_AMBULATORY_CARE_PROVIDER_SITE_OTHER): Payer: Medicare Other | Admitting: Urology

## 2021-12-07 ENCOUNTER — Ambulatory Visit: Payer: Medicare Other | Admitting: Physician Assistant

## 2021-12-07 ENCOUNTER — Encounter: Payer: Self-pay | Admitting: Urology

## 2021-12-07 VITALS — BP 112/73 | HR 105 | Ht 65.0 in | Wt 175.0 lb

## 2021-12-07 DIAGNOSIS — N319 Neuromuscular dysfunction of bladder, unspecified: Secondary | ICD-10-CM
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
## 2021-12-09 ENCOUNTER — Other Ambulatory Visit: Payer: Self-pay | Admitting: *Deleted

## 2021-12-09 MED ORDER — APIXABAN 5 MG PO TABS: 5.0000 mg | ORAL_TABLET | Freq: Two times a day (BID) | ORAL | 6 refills | Status: DC

## 2021-12-09 NOTE — Telephone Encounter (Signed)
Refilled Eliquis per last office note.

## 2021-12-20 ENCOUNTER — Ambulatory Visit (INDEPENDENT_AMBULATORY_CARE_PROVIDER_SITE_OTHER): Payer: Medicare Other | Admitting: Family Medicine

## 2021-12-20 ENCOUNTER — Encounter: Payer: Self-pay | Admitting: Family Medicine

## 2021-12-20 VITALS — BP 124/76 | HR 100 | Temp 97.6°F | Ht 65.0 in | Wt 184.8 lb

## 2021-12-20 DIAGNOSIS — Z13220 Encounter for screening for lipoid disorders: Secondary | ICD-10-CM

## 2021-12-20 DIAGNOSIS — E039 Hypothyroidism, unspecified: Secondary | ICD-10-CM

## 2021-12-20 MED ORDER — OMEPRAZOLE 40 MG PO CPDR
40.0000 mg | DELAYED_RELEASE_CAPSULE | Freq: Every day | ORAL | 3 refills | Status: DC
Start: 2021-12-20 — End: 2022-12-07

## 2021-12-20 NOTE — Progress Notes (Signed)
Subjective:  Patient ID: Amber Williams, female    DOB: 05-11-1975  Age: 46 y.o. MRN: [id number]  CC: Medical Management of Chronic Issues   HPI Amber Williams presents for  follow-up on  thyroid. The patient has a history of hypothyroidism for many years. It has been stable recently. Pt. denies any change in  voice, loss of hair, heat or cold intolerance. Energy level has been adequate to good. Patient denies constipation and diarrhea. No myxedema. Medication is as noted below. Verified that pt is taking it daily on an empty stomach. Well tolerated.       12/20/2021   12:56 PM 10/03/2021    2:45 PM 09/19/2021    2:37 PM  Depression screen PHQ 2/9  Decreased Interest 0 0 0  Down, Depressed, Hopeless 0 0 0  PHQ - 2 Score 0 0 0    History Amber Williams has a past medical history of Anal cancer ([place]) (01/10/2019), Arthritis, Depression, DVT (deep venous thrombosis) ([place]) (2013), Essential hypertension, Family history of breast cancer, Gait abnormality (08/03/2016), GERD (gastroesophageal reflux disease) (08/03/2020), History of blood transfusion (2013), Hyperlipidemia, Hypothyroidism, Hypothyroidism, Multiple sclerosis ([place]), Neurogenic bladder (08/03/2016), Neuromuscular disorder ([place]) (1993), Port-A-Cath in place (01/23/2019), PTSD (post-traumatic stress disorder), PTSD (post-traumatic stress disorder), Right foot drop (08/03/2016), Seasonal allergies, and Ulcer.   She has a past surgical history that includes Colon surgery; Gastric bypass (2010); Interstim Implant placement (2014); Portacath placement; Hemorrhoid surgery (N/A, 12/25/2018); Flexible sigmoidoscopy (N/A, 06/25/2019); Colonoscopy with propofol (N/A, 03/04/2020); Colonoscopy (N/A, 07/06/2020); Interstim Implant placement (N/A, 12/27/2020); Colonoscopy with propofol (N/A, 08/02/2021); Esophagogastroduodenoscopy (egd) with propofol (N/A, 08/17/2021); Esophageal dilation (N/A, 08/17/2021); biopsy (08/17/2021); and Interstim implant  revision (N/A, 11/24/2021).   Her family history includes Alcohol abuse in her sister; Breast cancer (age of onset: 41) in her sister; Breast cancer (age of onset: 61) in her mother; Cancer in her mother, paternal grandmother, and sister; Diabetes in her father, maternal grandmother, and paternal grandmother; Heart disease in her maternal grandfather, maternal grandmother, and paternal grandfather; Hypertension in her father and sister; Miscarriages / Stillbirths in her sister; Stroke in her paternal grandfather.She reports that she quit smoking about 12 years ago. Her smoking use included cigarettes. She started smoking about 30 years ago. She has a 22.50 pack-year smoking history. She has never used smokeless tobacco. She reports that she does not currently use alcohol. She reports that she does not use drugs.    ROS Review of Systems  Constitutional: Negative.   HENT: Negative.    Eyes:  Negative for visual disturbance.  Respiratory:  Negative for shortness of breath.   Cardiovascular:  Negative for chest pain.  Gastrointestinal:  Negative for abdominal pain.  Musculoskeletal:  Negative for arthralgias.    Objective:  BP 124/76   Pulse 100   Temp 97.6 F (36.4 C)   Ht _0  (1.651 m)   Wt 184 lb 12.8 oz (83.8 kg)   SpO2 94%   BMI 30.75 kg/m   BP Readings from Last 3 Encounters:  12/20/21 124/76  12/07/21 112/73  11/24/21 122/69    Wt Readings from Last 3 Encounters:  12/20/21 184 lb 12.8 oz (83.8 kg)  12/07/21 175 lb (79.4 kg)  11/21/21 173 lb 1 oz (78.5 kg)     Physical Exam Constitutional:      General: She is not in acute distress.    Appearance: She is well-developed.  Cardiovascular:     Rate and Rhythm: Normal rate and regular rhythm.  Pulmonary:     Breath sounds: Normal breath sounds.  Musculoskeletal:        General: Normal range of motion.  Skin:    General: Skin is warm and dry.  Neurological:     Mental Status: She is alert and oriented to person,  place, and time.       Assessment & Plan:   Amber Williams was seen today for medical management of chronic issues.  Diagnoses and all orders for this visit:  Acquired hypothyroidism -     CMP14+EGFR -     Lipid panel  Screening, lipid -     CMP14+EGFR -     Lipid panel  Other orders -     omeprazole (PRILOSEC) 40 MG capsule; Take 1 capsule (40 mg total) by mouth daily.       I have discontinued Annisa D. Knoles's oxyCODONE-acetaminophen. I have also changed her omeprazole. Additionally, I am having her maintain her Vitamin D3, Cyanocobalamin, multivitamin with minerals, Vitamin A, PARoxetine, mirabegron ER, levothyroxine, famotidine, oxymetazoline, QUEtiapine, vitamin C, and apixaban.  Allergies as of 12/20/2021       Reactions   Silicone Hives   hives   Tecfidera [dimethyl Fumarate] Itching        Medication List        Accurate as of December 20, 2021  8:23 PM. If you have any questions, ask your nurse or doctor.          STOP taking these medications    oxyCODONE-acetaminophen 5-325 MG tablet Commonly known as: Percocet Stopped by: [name], MD       TAKE these medications    apixaban 5 MG Tabs tablet Commonly known as: Eliquis Take 1 tablet (5 mg total) by mouth 2 (two) times daily.   Cyanocobalamin 5000 MCG Tbdp Take 5,000 mcg by mouth daily.   famotidine 40 MG tablet Commonly known as: PEPCID Take 1 tablet (40 mg total) by mouth at bedtime as needed for heartburn or indigestion.   levothyroxine 112 MCG tablet Commonly known as: SYNTHROID TAKE 1 TABLET BY MOUTH DAILY BEFORE breakfast   mirabegron ER 50 MG Tb24 tablet Commonly known as: MYRBETRIQ Take 50 mg by mouth daily.   multivitamin with minerals tablet Take 1 tablet by mouth daily. One a day woman   omeprazole 40 MG capsule Commonly known as: PRILOSEC Take 1 capsule (40 mg total) by mouth daily.   oxymetazoline 0.05 % nasal spray Commonly known as: AFRIN Place 1 spray into  both nostrils 2 (two) times daily as needed for congestion.   PARoxetine 20 MG tablet Commonly known as: Paxil Take 1 tablet (20 mg total) by mouth daily.   QUEtiapine 50 MG tablet Commonly known as: SEROQUEL Take 1 tablet (50 mg total) by mouth at bedtime.   Vitamin A 2400 MCG (8000 UT) Caps Take 8,000 Units by mouth daily.   vitamin C 1000 MG tablet Take 1,000 mg by mouth daily.   Vitamin D3 125 MCG (5000 UT) Caps Take 5,000 Units by mouth every morning.         Follow-up: Return in about 6 months (around 06/22/2022).  [name], M.D.

## 2021-12-21 LAB — LIPID PANEL
Chol/HDL Ratio: 2.9 ratio (ref 0.0–4.4)
Cholesterol, Total: 214 mg/dL — ABNORMAL HIGH (ref 100–199)
HDL: 75 mg/dL (ref >39)
LDL Chol Calc (NIH): 122 mg/dL — ABNORMAL HIGH (ref 0–99)
Triglycerides: 98 mg/dL (ref 0–149)
VLDL Cholesterol Cal: 17 mg/dL (ref 5–40)

## 2021-12-21 LAB — CMP14+EGFR
ALT: 15 IU/L (ref 0–32)
AST: 21 IU/L (ref 0–40)
Albumin/Globulin Ratio: 3.2 — ABNORMAL HIGH (ref 1.2–2.2)
Albumin: 4.2 g/dL (ref 3.9–4.9)
Alkaline Phosphatase: 174 IU/L — ABNORMAL HIGH (ref 44–121)
BUN/Creatinine Ratio: 8 — ABNORMAL LOW (ref 9–23)
BUN: 8 mg/dL (ref 6–24)
Bilirubin Total: <0.2 mg/dL (ref 0.0–1.2)
CO2: 23 mmol/L (ref 20–29)
Calcium: 10.4 mg/dL — ABNORMAL HIGH (ref 8.7–10.2)
Chloride: 105 mmol/L (ref 96–106)
Creatinine, Ser: 0.96 mg/dL (ref 0.57–1.00)
Globulin, Total: 1.3 g/dL — ABNORMAL LOW (ref 1.5–4.5)
Glucose: 40 mg/dL — ABNORMAL LOW (ref 70–99)
Potassium: 4.4 mmol/L (ref 3.5–5.2)
Sodium: 143 mmol/L (ref 134–144)
Total Protein: 5.5 g/dL — ABNORMAL LOW (ref 6.0–8.5)
eGFR: 74 mL/min/{1.73_m2} (ref >59)

## 2022-01-11 ENCOUNTER — Encounter: Payer: Self-pay | Admitting: Urology

## 2022-01-11 ENCOUNTER — Ambulatory Visit (INDEPENDENT_AMBULATORY_CARE_PROVIDER_SITE_OTHER): Payer: Medicare Other | Admitting: Urology

## 2022-01-11 VITALS — BP 110/74 | HR 81

## 2022-01-11 DIAGNOSIS — N319 Neuromuscular dysfunction of bladder, unspecified: Secondary | ICD-10-CM

## 2022-01-11 DIAGNOSIS — R32 Unspecified urinary incontinence: Secondary | ICD-10-CM | POA: Diagnosis not present

## 2022-01-11 LAB — MICROSCOPIC EXAMINATION
Renal Epithel, UA: NONE SEEN /hpf
WBC, UA: >30 /hpf — AB (ref 0–5)

## 2022-01-11 LAB — URINALYSIS, ROUTINE W REFLEX MICROSCOPIC
Bilirubin, UA: NEGATIVE
Glucose, UA: NEGATIVE
Ketones, UA: NEGATIVE
Nitrite, UA: NEGATIVE
Specific Gravity, UA: 1.015 (ref 1.005–1.030)
Urobilinogen, Ur: 0.2 mg/dL (ref 0.2–1.0)
pH, UA: 6 (ref 5.0–7.5)

## 2022-01-11 NOTE — Patient Instructions (Signed)
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
## 2022-01-24 DIAGNOSIS — C210 Malignant neoplasm of anus, unspecified: Secondary | ICD-10-CM | POA: Diagnosis not present

## 2022-01-24 DIAGNOSIS — L8942 Pressure ulcer of contiguous site of back, buttock and hip, stage 2: Secondary | ICD-10-CM | POA: Diagnosis not present

## 2022-01-26 DIAGNOSIS — Y842 Radiological procedure and radiotherapy as the cause of abnormal reaction of the patient, or of later complication, without mention of misadventure at the time of the procedure: Secondary | ICD-10-CM | POA: Diagnosis not present

## 2022-01-26 DIAGNOSIS — R32 Unspecified urinary incontinence: Secondary | ICD-10-CM | POA: Diagnosis not present

## 2022-01-26 DIAGNOSIS — Z923 Personal history of irradiation: Secondary | ICD-10-CM | POA: Diagnosis not present

## 2022-01-26 DIAGNOSIS — I10 Essential (primary) hypertension: Secondary | ICD-10-CM | POA: Diagnosis not present

## 2022-01-26 DIAGNOSIS — C210 Malignant neoplasm of anus, unspecified: Secondary | ICD-10-CM | POA: Diagnosis not present

## 2022-01-26 DIAGNOSIS — G35 Multiple sclerosis: Secondary | ICD-10-CM | POA: Diagnosis not present

## 2022-01-26 DIAGNOSIS — I781 Nevus, non-neoplastic: Secondary | ICD-10-CM | POA: Diagnosis not present

## 2022-01-26 DIAGNOSIS — Z9221 Personal history of antineoplastic chemotherapy: Secondary | ICD-10-CM | POA: Diagnosis not present

## 2022-01-26 DIAGNOSIS — L905 Scar conditions and fibrosis of skin: Secondary | ICD-10-CM | POA: Diagnosis not present

## 2022-01-26 DIAGNOSIS — Z7901 Long term (current) use of anticoagulants: Secondary | ICD-10-CM | POA: Diagnosis not present

## 2022-01-26 DIAGNOSIS — Z86718 Personal history of other venous thrombosis and embolism: Secondary | ICD-10-CM | POA: Diagnosis not present

## 2022-01-26 DIAGNOSIS — L89322 Pressure ulcer of left buttock, stage 2: Secondary | ICD-10-CM | POA: Diagnosis not present

## 2022-02-02 ENCOUNTER — Ambulatory Visit (INDEPENDENT_AMBULATORY_CARE_PROVIDER_SITE_OTHER): Payer: Medicare Other

## 2022-02-02 DIAGNOSIS — L89322 Pressure ulcer of left buttock, stage 2: Secondary | ICD-10-CM | POA: Diagnosis not present

## 2022-02-02 DIAGNOSIS — C210 Malignant neoplasm of anus, unspecified: Secondary | ICD-10-CM

## 2022-02-02 DIAGNOSIS — R32 Unspecified urinary incontinence: Secondary | ICD-10-CM

## 2022-02-02 DIAGNOSIS — I10 Essential (primary) hypertension: Secondary | ICD-10-CM

## 2022-02-02 DIAGNOSIS — G35 Multiple sclerosis: Secondary | ICD-10-CM | POA: Diagnosis not present

## 2022-02-02 DIAGNOSIS — I781 Nevus, non-neoplastic: Secondary | ICD-10-CM

## 2022-02-02 DIAGNOSIS — L905 Scar conditions and fibrosis of skin: Secondary | ICD-10-CM

## 2022-02-07 ENCOUNTER — Telehealth: Payer: Self-pay | Admitting: Hematology

## 2022-02-07 NOTE — Telephone Encounter (Signed)
Pts assistance is still pending. [place] and approved a 7 day supply of Eliquis

## 2022-02-09 DIAGNOSIS — N390 Urinary tract infection, site not specified: Secondary | ICD-10-CM | POA: Diagnosis not present

## 2022-02-09 DIAGNOSIS — R350 Frequency of micturition: Secondary | ICD-10-CM | POA: Diagnosis not present

## 2022-02-15 ENCOUNTER — Telehealth: Payer: Self-pay | Admitting: Hematology

## 2022-02-15 NOTE — Telephone Encounter (Signed)
[employer] APPROVED ASSISTANCE FOR ELIQUIS ON 02/09/22.PER TANIKA PT HAS TO RE ENROLL ON 03/31/2022. SPOKE TO PT SHE HAS RECEIVED A  3 MO SUPPLY ON 02/14/22. SHE IS AWARE SHE MUST RE APPLY ON 03/31/22

## 2022-02-17 ENCOUNTER — Inpatient Hospital Stay: Payer: Medicare Other | Attending: Hematology

## 2022-02-17 DIAGNOSIS — Z803 Family history of malignant neoplasm of breast: Secondary | ICD-10-CM | POA: Insufficient documentation

## 2022-02-17 DIAGNOSIS — C210 Malignant neoplasm of anus, unspecified: Secondary | ICD-10-CM

## 2022-02-17 DIAGNOSIS — Z7901 Long term (current) use of anticoagulants: Secondary | ICD-10-CM | POA: Insufficient documentation

## 2022-02-17 DIAGNOSIS — Z85048 Personal history of other malignant neoplasm of rectum, rectosigmoid junction, and anus: Secondary | ICD-10-CM | POA: Diagnosis not present

## 2022-02-17 DIAGNOSIS — Z7983 Long term (current) use of bisphosphonates: Secondary | ICD-10-CM | POA: Diagnosis not present

## 2022-02-17 DIAGNOSIS — G35 Multiple sclerosis: Secondary | ICD-10-CM | POA: Insufficient documentation

## 2022-02-17 DIAGNOSIS — Z806 Family history of leukemia: Secondary | ICD-10-CM | POA: Diagnosis not present

## 2022-02-17 DIAGNOSIS — Z87891 Personal history of nicotine dependence: Secondary | ICD-10-CM | POA: Insufficient documentation

## 2022-02-17 DIAGNOSIS — Z86718 Personal history of other venous thrombosis and embolism: Secondary | ICD-10-CM | POA: Diagnosis present

## 2022-02-17 DIAGNOSIS — I82403 Acute embolism and thrombosis of unspecified deep veins of lower extremity, bilateral: Secondary | ICD-10-CM

## 2022-02-17 DIAGNOSIS — R7401 Elevation of levels of liver transaminase levels: Secondary | ICD-10-CM | POA: Diagnosis not present

## 2022-02-17 LAB — COMPREHENSIVE METABOLIC PANEL
ALT: 21 U/L (ref 0–44)
AST: 27 U/L (ref 15–41)
Albumin: 4 g/dL (ref 3.5–5.0)
Alkaline Phosphatase: 130 U/L — ABNORMAL HIGH (ref 38–126)
Anion gap: 8 (ref 5–15)
BUN: 10 mg/dL (ref 6–20)
CO2: 26 mmol/L (ref 22–32)
Calcium: 10.2 mg/dL (ref 8.9–10.3)
Chloride: 106 mmol/L (ref 98–111)
Creatinine, Ser: 1.05 mg/dL — ABNORMAL HIGH (ref 0.44–1.00)
GFR, Estimated: >60 mL/min (ref >60)
Glucose, Bld: 86 mg/dL (ref 70–99)
Potassium: 4 mmol/L (ref 3.5–5.1)
Sodium: 140 mmol/L (ref 135–145)
Total Bilirubin: 0.8 mg/dL (ref 0.3–1.2)
Total Protein: 6 g/dL — ABNORMAL LOW (ref 6.5–8.1)

## 2022-02-17 LAB — CBC WITH DIFFERENTIAL/PLATELET
Abs Immature Granulocytes: 0.01 10*3/uL (ref 0.00–0.07)
Basophils Absolute: 0.1 10*3/uL (ref 0.0–0.1)
Basophils Relative: 1 %
Eosinophils Absolute: 0.2 10*3/uL (ref 0.0–0.5)
Eosinophils Relative: 3 %
HCT: 40.2 % (ref 36.0–46.0)
Hemoglobin: 12.6 g/dL (ref 12.0–15.0)
Immature Granulocytes: 0 %
Lymphocytes Relative: 14 %
Lymphs Abs: 0.8 10*3/uL (ref 0.7–4.0)
MCH: 31.5 pg (ref 26.0–34.0)
MCHC: 31.3 g/dL (ref 30.0–36.0)
MCV: 100.5 fL — ABNORMAL HIGH (ref 80.0–100.0)
Monocytes Absolute: 0.5 10*3/uL (ref 0.1–1.0)
Monocytes Relative: 8 %
Neutro Abs: 4.5 10*3/uL (ref 1.7–7.7)
Neutrophils Relative %: 74 %
Platelets: 304 10*3/uL (ref 150–400)
RBC: 4 MIL/uL (ref 3.87–5.11)
RDW: 14 % (ref 11.5–15.5)
WBC: 6 10*3/uL (ref 4.0–10.5)
nRBC: 0 % (ref 0.0–0.2)

## 2022-02-17 LAB — D-DIMER, QUANTITATIVE: D-Dimer, Quant: <0.27 ug/mL-FEU (ref 0.00–0.50)

## 2022-02-20 ENCOUNTER — Other Ambulatory Visit: Payer: Medicare Other

## 2022-02-25 DIAGNOSIS — R32 Unspecified urinary incontinence: Secondary | ICD-10-CM | POA: Diagnosis not present

## 2022-02-25 DIAGNOSIS — I10 Essential (primary) hypertension: Secondary | ICD-10-CM | POA: Diagnosis not present

## 2022-02-25 DIAGNOSIS — Z86718 Personal history of other venous thrombosis and embolism: Secondary | ICD-10-CM | POA: Diagnosis not present

## 2022-02-25 DIAGNOSIS — G35 Multiple sclerosis: Secondary | ICD-10-CM | POA: Diagnosis not present

## 2022-02-25 DIAGNOSIS — Z9221 Personal history of antineoplastic chemotherapy: Secondary | ICD-10-CM | POA: Diagnosis not present

## 2022-02-25 DIAGNOSIS — C210 Malignant neoplasm of anus, unspecified: Secondary | ICD-10-CM | POA: Diagnosis not present

## 2022-02-25 DIAGNOSIS — L905 Scar conditions and fibrosis of skin: Secondary | ICD-10-CM | POA: Diagnosis not present

## 2022-02-25 DIAGNOSIS — L89322 Pressure ulcer of left buttock, stage 2: Secondary | ICD-10-CM | POA: Diagnosis not present

## 2022-02-25 DIAGNOSIS — Y842 Radiological procedure and radiotherapy as the cause of abnormal reaction of the patient, or of later complication, without mention of misadventure at the time of the procedure: Secondary | ICD-10-CM | POA: Diagnosis not present

## 2022-02-25 DIAGNOSIS — I781 Nevus, non-neoplastic: Secondary | ICD-10-CM | POA: Diagnosis not present

## 2022-02-25 DIAGNOSIS — Z7901 Long term (current) use of anticoagulants: Secondary | ICD-10-CM | POA: Diagnosis not present

## 2022-02-25 DIAGNOSIS — Z923 Personal history of irradiation: Secondary | ICD-10-CM | POA: Diagnosis not present

## 2022-02-27 ENCOUNTER — Encounter: Payer: Self-pay | Admitting: Hematology

## 2022-02-27 ENCOUNTER — Inpatient Hospital Stay (HOSPITAL_BASED_OUTPATIENT_CLINIC_OR_DEPARTMENT_OTHER): Payer: Medicare Other | Admitting: Hematology

## 2022-02-27 VITALS — BP 104/49 | HR 94 | Temp 98.8°F | Wt 182.6 lb

## 2022-02-27 DIAGNOSIS — Z7983 Long term (current) use of bisphosphonates: Secondary | ICD-10-CM | POA: Diagnosis not present

## 2022-02-27 DIAGNOSIS — Z806 Family history of leukemia: Secondary | ICD-10-CM | POA: Diagnosis not present

## 2022-02-27 DIAGNOSIS — Z7901 Long term (current) use of anticoagulants: Secondary | ICD-10-CM | POA: Diagnosis not present

## 2022-02-27 DIAGNOSIS — C210 Malignant neoplasm of anus, unspecified: Secondary | ICD-10-CM | POA: Diagnosis not present

## 2022-02-27 DIAGNOSIS — Z803 Family history of malignant neoplasm of breast: Secondary | ICD-10-CM | POA: Diagnosis not present

## 2022-02-27 DIAGNOSIS — Z85048 Personal history of other malignant neoplasm of rectum, rectosigmoid junction, and anus: Secondary | ICD-10-CM | POA: Diagnosis not present

## 2022-02-27 DIAGNOSIS — Z87891 Personal history of nicotine dependence: Secondary | ICD-10-CM | POA: Diagnosis not present

## 2022-02-27 DIAGNOSIS — R7401 Elevation of levels of liver transaminase levels: Secondary | ICD-10-CM | POA: Diagnosis not present

## 2022-02-27 DIAGNOSIS — Z86718 Personal history of other venous thrombosis and embolism: Secondary | ICD-10-CM | POA: Diagnosis not present

## 2022-02-27 DIAGNOSIS — G35 Multiple sclerosis: Secondary | ICD-10-CM | POA: Diagnosis not present

## 2022-02-27 NOTE — Patient Instructions (Addendum)
[facility]  Discharge Instructions  You were seen and examined today by Dr. Delton Coombes.  Dr. Delton Coombes discussed your most recent lab work which revealed that everything looks stable.  Follow-up as scheduled in 6 months.    Thank you for choosing [facility] to provide your oncology and hematology care.   To afford each patient quality time with our provider, please arrive at least 15 minutes before your scheduled appointment time. You may need to reschedule your appointment if you arrive late (10 or more minutes). Arriving late affects you and other patients whose appointments are after yours.  Also, if you miss three or more appointments without notifying the office, you may be dismissed from the clinic at the provider's discretion.    Again, thank you for choosing [facility].  Our hope is that these requests will decrease the amount of time that you wait before being seen by our physicians.   If you have a lab appointment with the [facility] please come in thru the Main Entrance and check in at the main information desk.           _____________________________________________________________  Should you have questions after your visit to [facility], please contact our office at [phone] and follow the prompts.  Our office hours are 8:00 a.m. to 4:30 p.m. Monday - Thursday and 8:00 a.m. to 2:30 p.m. Friday.  Please note that voicemails left after 4:00 p.m. may not be returned until the following business day.  We are closed weekends and all major holidays.  You do have access to a nurse 24-7, just call the main number to the clinic [phone] and do not press any options, hold on the line and a nurse will answer the phone.    For prescription refill requests, have your pharmacy contact our office and allow 72 hours.    Masks are optional in the cancer centers. If you would like for your care  team to wear a mask while they are taking care of you, please let them know. You may have one support person who is at least 46 years old accompany you for your appointments.
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
## 2022-03-02 DIAGNOSIS — R5383 Other fatigue: Secondary | ICD-10-CM | POA: Diagnosis not present

## 2022-03-02 DIAGNOSIS — G35 Multiple sclerosis: Secondary | ICD-10-CM | POA: Diagnosis not present

## 2022-03-02 DIAGNOSIS — M21371 Foot drop, right foot: Secondary | ICD-10-CM | POA: Diagnosis not present

## 2022-03-02 DIAGNOSIS — M6281 Muscle weakness (generalized): Secondary | ICD-10-CM | POA: Diagnosis not present

## 2022-03-06 ENCOUNTER — Inpatient Hospital Stay: Payer: Medicare Other | Attending: Hematology

## 2022-03-06 VITALS — BP 131/66 | HR 89 | Temp 96.9°F | Resp 18

## 2022-03-06 DIAGNOSIS — Z7901 Long term (current) use of anticoagulants: Secondary | ICD-10-CM | POA: Insufficient documentation

## 2022-03-06 DIAGNOSIS — Z85048 Personal history of other malignant neoplasm of rectum, rectosigmoid junction, and anus: Secondary | ICD-10-CM | POA: Insufficient documentation

## 2022-03-06 DIAGNOSIS — Z806 Family history of leukemia: Secondary | ICD-10-CM | POA: Insufficient documentation

## 2022-03-06 DIAGNOSIS — Z86718 Personal history of other venous thrombosis and embolism: Secondary | ICD-10-CM | POA: Insufficient documentation

## 2022-03-06 DIAGNOSIS — Z452 Encounter for adjustment and management of vascular access device: Secondary | ICD-10-CM | POA: Diagnosis not present

## 2022-03-06 DIAGNOSIS — Z803 Family history of malignant neoplasm of breast: Secondary | ICD-10-CM | POA: Diagnosis not present

## 2022-03-06 DIAGNOSIS — Z87891 Personal history of nicotine dependence: Secondary | ICD-10-CM | POA: Diagnosis not present

## 2022-03-06 DIAGNOSIS — Z7983 Long term (current) use of bisphosphonates: Secondary | ICD-10-CM | POA: Insufficient documentation

## 2022-03-06 DIAGNOSIS — G35 Multiple sclerosis: Secondary | ICD-10-CM | POA: Diagnosis not present

## 2022-03-06 DIAGNOSIS — Z95828 Presence of other vascular implants and grafts: Secondary | ICD-10-CM

## 2022-03-06 DIAGNOSIS — R7401 Elevation of levels of liver transaminase levels: Secondary | ICD-10-CM | POA: Diagnosis not present

## 2022-03-06 MED ORDER — HEPARIN SOD (PORK) LOCK FLUSH 100 UNIT/ML IV SOLN
500.0000 [IU] | Freq: Once | INTRAVENOUS | Status: AC
  Administered 2022-03-06: 500 [IU] via INTRAVENOUS

## 2022-03-06 MED ORDER — SODIUM CHLORIDE 0.9% FLUSH
10.0000 mL | Freq: Once | INTRAVENOUS | Status: AC
  Administered 2022-03-06: 10 mL via INTRAVENOUS

## 2022-03-06 NOTE — Progress Notes (Signed)
Port flushed with good blood return noted. No bruising or swelling at site. Bandaid applied and patient discharged in satisfactory condition. VVS stable with no signs or symptoms of distressed noted. 

## 2022-03-06 NOTE — Patient Instructions (Signed)
[facility]  Discharge Instructions: Thank you for choosing [facility] to provide your oncology and hematology care.  If you have a lab appointment with the [facility], please come in thru the Main Entrance and check in at the main information desk.  Wear comfortable clothing and clothing appropriate for easy access to any Portacath or PICC line.   We strive to give you quality time with your provider. You may need to reschedule your appointment if you arrive late (15 or more minutes).  Arriving late affects you and other patients whose appointments are after yours.  Also, if you miss three or more appointments without notifying the office, you may be dismissed from the clinic at the provider's discretion.      For prescription refill requests, have your pharmacy contact our office and allow 72 hours for refills to be completed.    Today you received the following Port flush, return as scheduled.   To help prevent nausea and vomiting after your treatment, we encourage you to take your nausea medication as directed.  BELOW ARE SYMPTOMS THAT SHOULD BE REPORTED IMMEDIATELY: *FEVER GREATER THAN 100.4 F (38 C) OR HIGHER *CHILLS OR SWEATING *NAUSEA AND VOMITING THAT IS NOT CONTROLLED WITH YOUR NAUSEA MEDICATION *UNUSUAL SHORTNESS OF BREATH *UNUSUAL BRUISING OR BLEEDING *URINARY PROBLEMS (pain or burning when urinating, or frequent urination) *BOWEL PROBLEMS (unusual diarrhea, constipation, pain near the anus) TENDERNESS IN MOUTH AND THROAT WITH OR WITHOUT PRESENCE OF ULCERS (sore throat, sores in mouth, or a toothache) UNUSUAL RASH, SWELLING OR PAIN  UNUSUAL VAGINAL DISCHARGE OR ITCHING   Items with * indicate a potential emergency and should be followed up as soon as possible or go to the Emergency Department if any problems should occur.  Please show the CHEMOTHERAPY ALERT CARD or IMMUNOTHERAPY ALERT CARD at check-in to the Emergency Department and  triage nurse.  Should you have questions after your visit or need to cancel or reschedule your appointment, please contact [facility] [phone]  and follow the prompts.  Office hours are 8:00 a.m. to 4:30 p.m. Monday - Friday. Please note that voicemails left after 4:00 p.m. may not be returned until the following business day.  We are closed weekends and major holidays. You have access to a nurse at all times for urgent questions. Please call the main number to the clinic [phone] and follow the prompts.  For any non-urgent questions, you may also contact your provider using MyChart. We now offer e-Visits for anyone 89 and older to request care online for non-urgent symptoms. For details visit [email or web address].   Also download the MyChart app! Go to the app store, search "MyChart", open the app, select [facility], and log in with your MyChart username and password.  Masks are optional in the cancer centers. If you would like for your care team to wear a mask while they are taking care of you, please let them know. You may have one support person who is at least 46 years old accompany you for your appointments.

## 2022-03-13 ENCOUNTER — Encounter (INDEPENDENT_AMBULATORY_CARE_PROVIDER_SITE_OTHER): Payer: Self-pay | Admitting: Gastroenterology

## 2022-03-30 ENCOUNTER — Other Ambulatory Visit: Payer: Self-pay | Admitting: Family Medicine

## 2022-04-04 DIAGNOSIS — H5213 Myopia, bilateral: Secondary | ICD-10-CM | POA: Diagnosis not present

## 2022-04-12 ENCOUNTER — Ambulatory Visit (INDEPENDENT_AMBULATORY_CARE_PROVIDER_SITE_OTHER): Payer: Medicare Other | Admitting: Urology

## 2022-04-12 DIAGNOSIS — R32 Unspecified urinary incontinence: Secondary | ICD-10-CM

## 2022-04-12 DIAGNOSIS — N319 Neuromuscular dysfunction of bladder, unspecified: Secondary | ICD-10-CM

## 2022-04-12 LAB — MICROSCOPIC EXAMINATION: WBC, UA: >30 /hpf — AB (ref 0–5)

## 2022-04-12 LAB — URINALYSIS, ROUTINE W REFLEX MICROSCOPIC
Bilirubin, UA: NEGATIVE
Glucose, UA: NEGATIVE
Ketones, UA: NEGATIVE
Nitrite, UA: POSITIVE — AB
Specific Gravity, UA: 1.01 (ref 1.005–1.030)
Urobilinogen, Ur: 0.2 mg/dL (ref 0.2–1.0)
pH, UA: 6 (ref 5.0–7.5)
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
## 2022-04-12 NOTE — Patient Instructions (Signed)
Please call [phone] With [employer] ID and phone number for providers to call.

## 2022-04-13 ENCOUNTER — Telehealth: Payer: Self-pay | Admitting: Hematology

## 2022-04-13 NOTE — Telephone Encounter (Signed)
Faxed pt assist app to [facility] [phone] F

## 2022-04-20 ENCOUNTER — Encounter: Payer: Self-pay | Admitting: Urology

## 2022-05-10 ENCOUNTER — Ambulatory Visit: Payer: Medicare HMO | Admitting: Urology

## 2022-05-10 DIAGNOSIS — N319 Neuromuscular dysfunction of bladder, unspecified: Secondary | ICD-10-CM | POA: Diagnosis not present

## 2022-05-10 NOTE — Progress Notes (Signed)
PTNS  Session # 1 of 12   Health & Social Factors: no Caffeine: 2 v8's Alcohol: none Daytime voids #per day: 5-8 Night-time voids #per night: 1 Urgency: yes Incontinence Episodes #per day: 5 Ankle used: left Treatment Setting: 6 Feeling/ Response: feeling in foot Comments: n/a  Performed By: Durenda Guthrie, LPN  Follow Up: Follow up as scheduled.

## 2022-05-17 ENCOUNTER — Ambulatory Visit (INDEPENDENT_AMBULATORY_CARE_PROVIDER_SITE_OTHER): Payer: Medicare HMO | Admitting: Urology

## 2022-05-17 DIAGNOSIS — N319 Neuromuscular dysfunction of bladder, unspecified: Secondary | ICD-10-CM | POA: Diagnosis not present

## 2022-05-17 NOTE — Progress Notes (Signed)
PTNS  Session # 2 of 12  Health & Social Factors: no Caffeine: 2 v8's, tea Alcohol: none Daytime voids #per day: 5 Night-time voids #per night: 1 Urgency: yes Incontinence Episodes #per day: 3 Ankle used: left Treatment Setting: 9 Feeling/ Response: feeling in heel Comments: n/a  Performed By: Levi Aland, CMA  Follow Up: Follow up as scheduled.

## 2022-05-23 IMAGING — MR MR SACRUM / SI JOINTS WO/W CM
4 of 8 series · 18 of 48 positions shown · IV contrast (gadavist)
Comparison: Radiographs 03/26/2021 CT scan [DATE]

CLINICAL DATA: Low back pain.  Laceration.

EXAM:
MRI PELVIS WITHOUT AND WITH CONTRAST
TECHNIQUE: Multiplanar multisequence MR imaging of the pelvis was performed
both before and after administration of intravenous contrast.
CONTRAST:  8mL GADAVIST GADOBUTROL 1 MMOL/ML IV SOLN

[Series 3: T1 · axial · 4.0mm · 0.49mm/px · z∈[-101,+12]mm · 6 of 26 slices shown (1 of 2)]
[im 1/26]
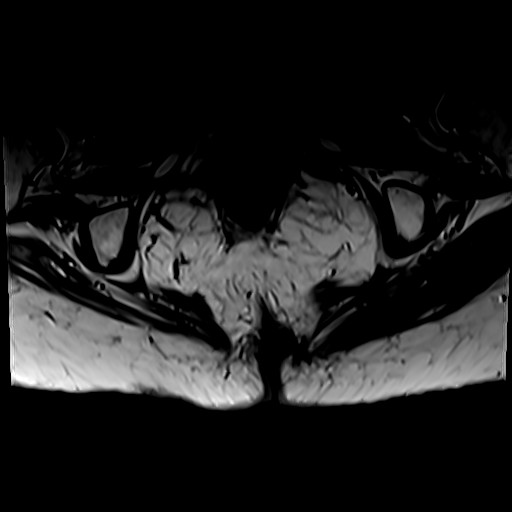
[im 6/26]
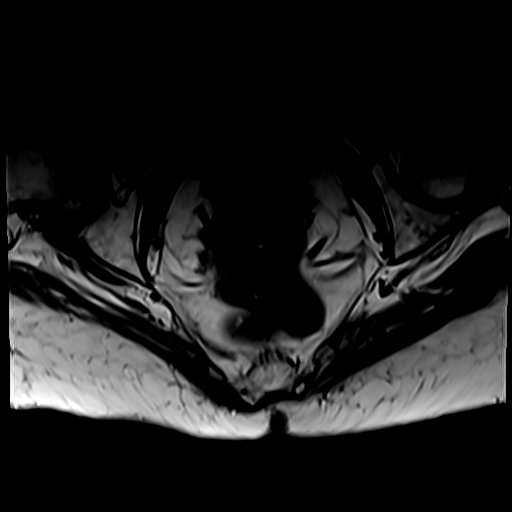
[im 11/26]
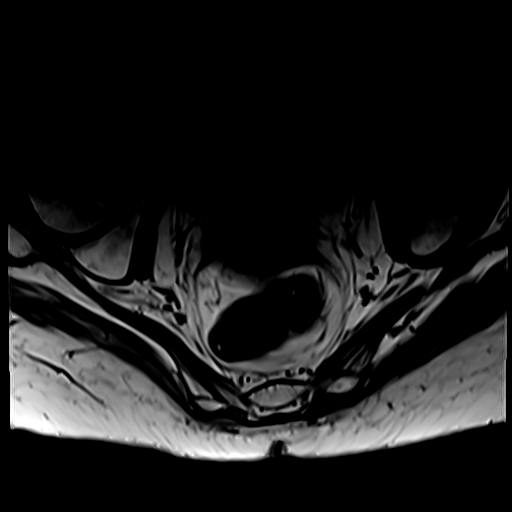
[im 16/26]
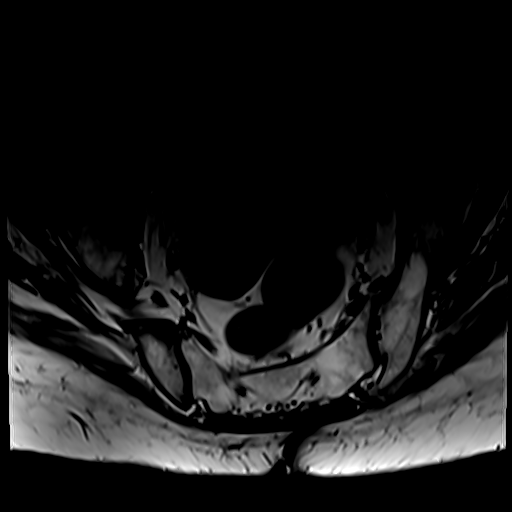
[im 21/26]
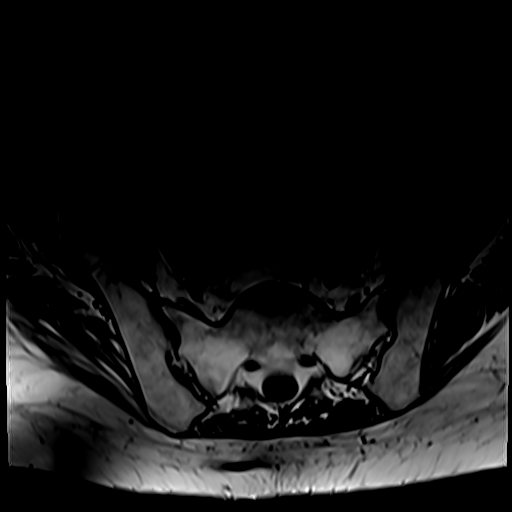
[im 26/26]
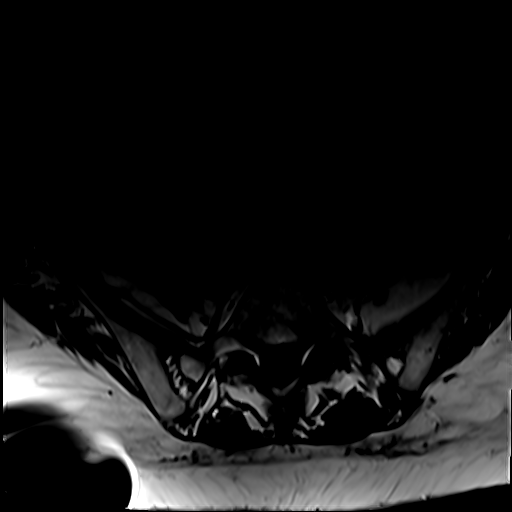

[Series 6: T1 · coronal · 3.0mm · 0.43mm/px · 6 of 27 slices shown (2 of 2)]
[im 1/27]
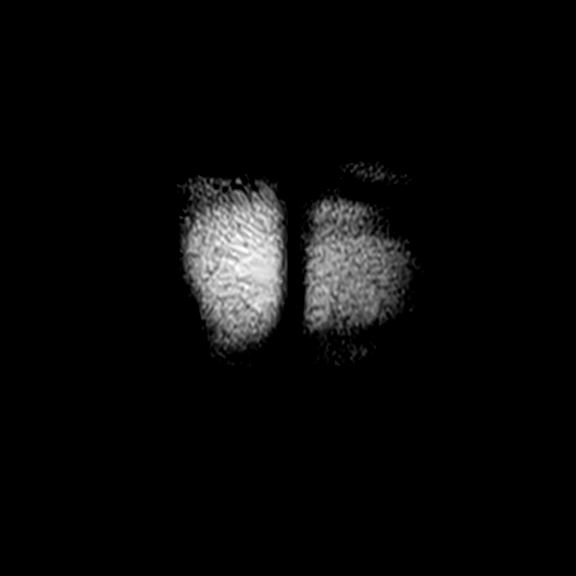
[im 6/27]
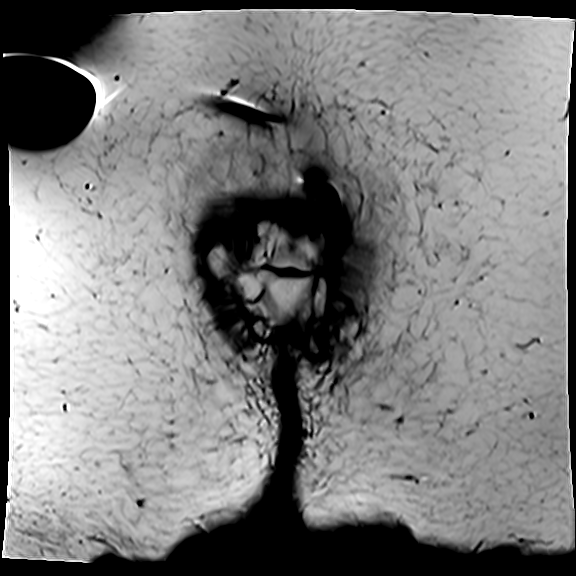
[im 11/27]
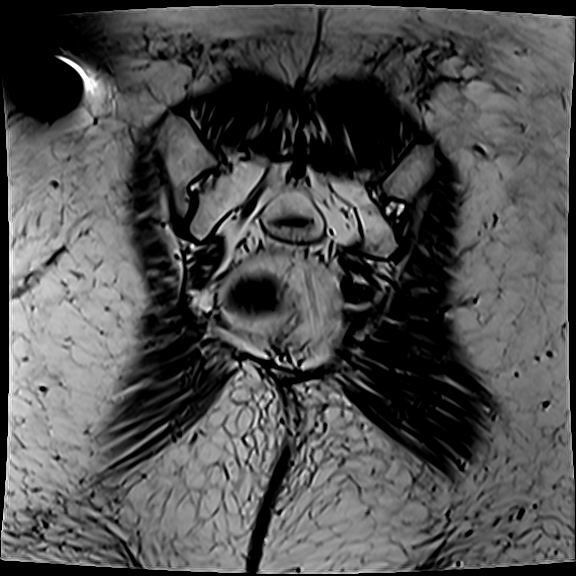
[im 16/27]
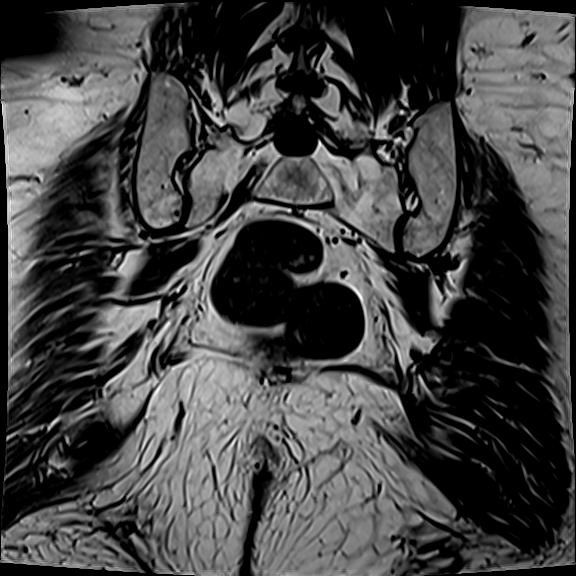
[im 21/27]
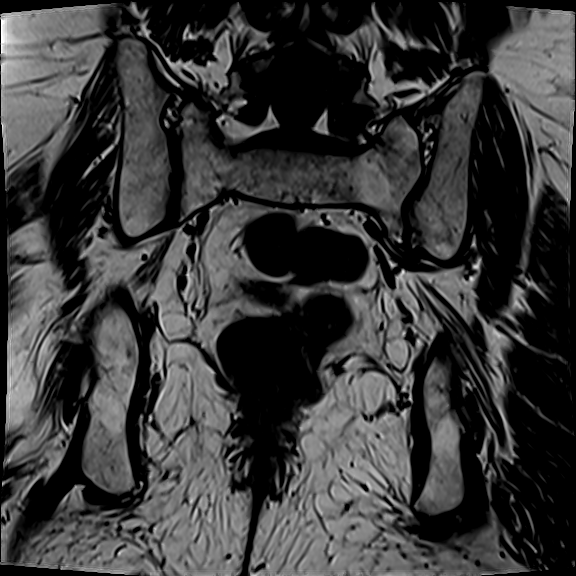
[im 27/27]
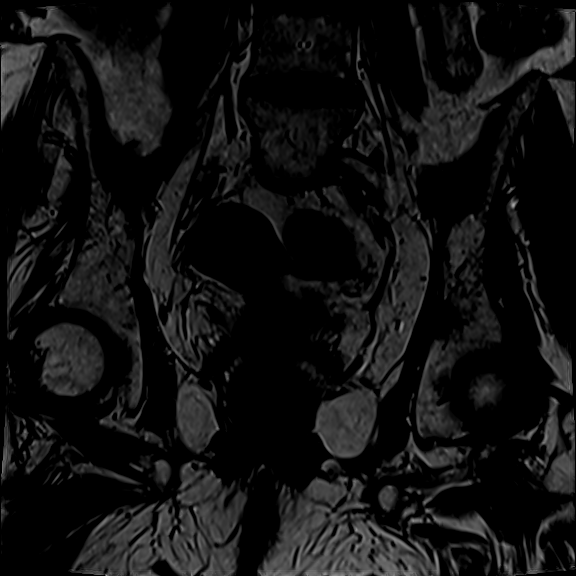

[Series 8: T1 fat-sat · axial · non-contrast · 4.0mm · 0.49mm/px · z∈[-80,+8]mm · 3 of 26 slices shown]
[im 6/26]
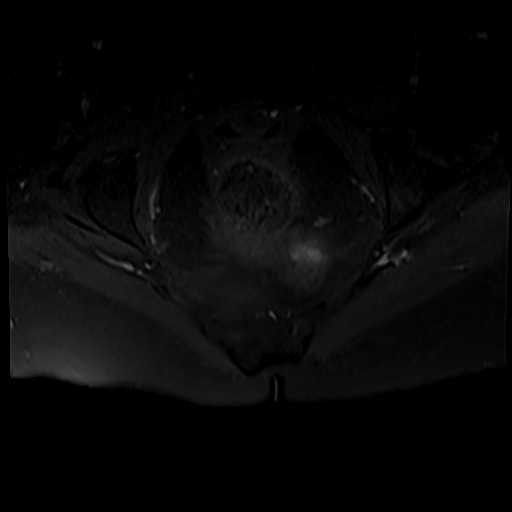
[im 16/26]
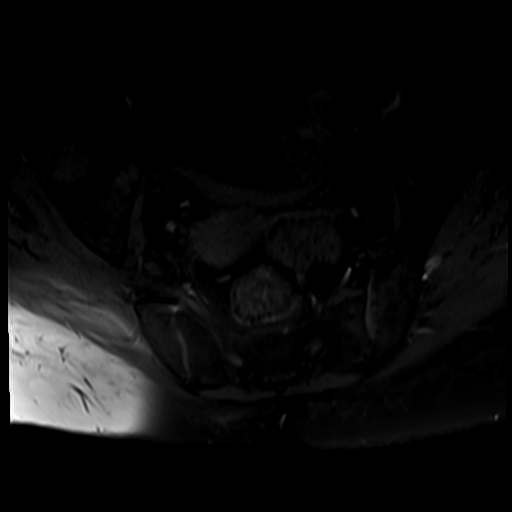
[im 26/26]
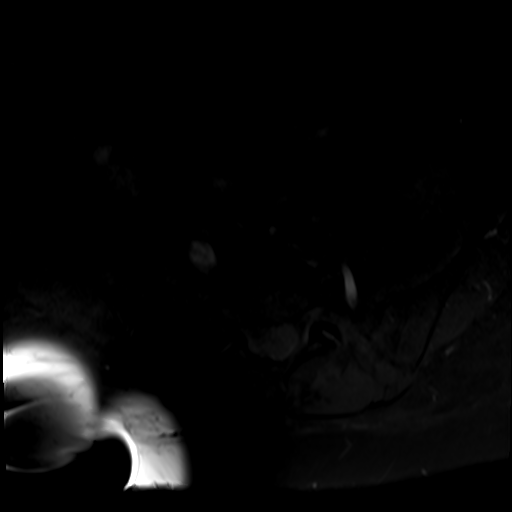

[Series 9: T1 fat-sat post-contrast · axial · 4.0mm · 0.49mm/px · z∈[-80,+8]mm · 3 of 26 slices shown]
[im 6/26]
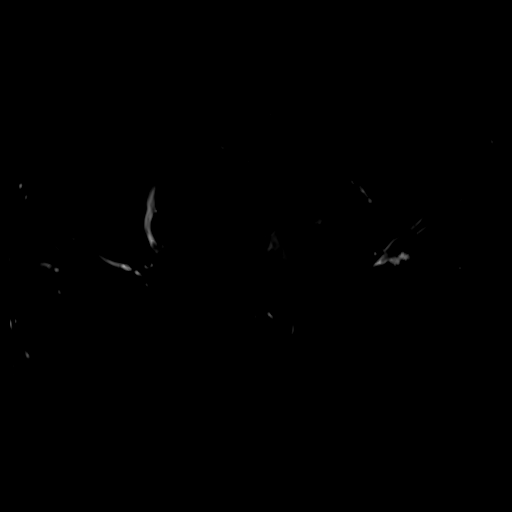
[im 16/26]
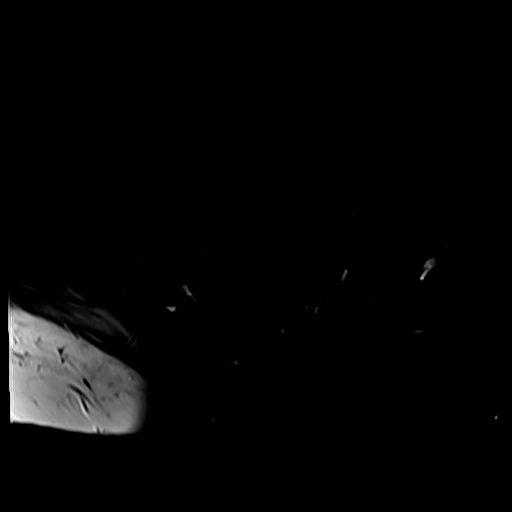
[im 26/26]
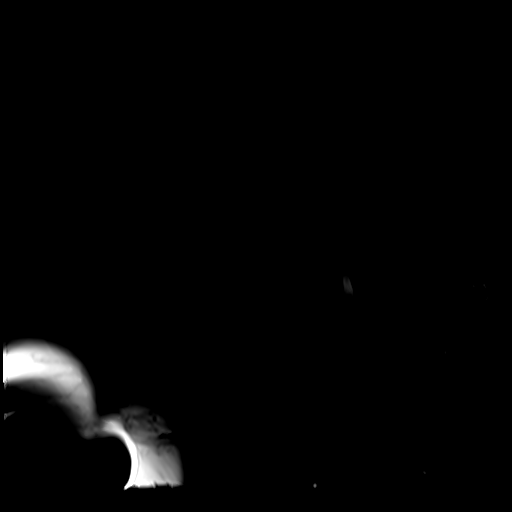

[18 of 48 positions shown; findings below may reference images not displayed]

FINDINGS: Urinary Tract: The bladder is unremarkable. Bladder mass or calculi.
No asymmetric bladder wall thickening.

Bowel:  The rectum, sigmoid colon and visualized bowel.

Vascular/Lymphatic: No significant vascular findings or pelvic
lymphadenopathy.

Reproductive:  The uterus and ovaries are.

Other:  No pelvic mass or free pelvic fluid collections.

Musculoskeletal: Minimal SI joint degenerative changes. No findings
for sacroiliitis. Mild hip joint degenerative changes. No stress
fracture or bone lesions. The visualized hip and pelvic musculature
grossly normal. No inguinal mass or hernia.
IMPRESSION: 1. Mild bilateral hip joint degenerative changes and minimal SI
joint degenerative changes.
2. No pelvic mass, adenopathy the or free pelvic fluid collections.

## 2022-05-24 ENCOUNTER — Ambulatory Visit: Payer: Medicare HMO | Admitting: Urology

## 2022-05-24 DIAGNOSIS — N319 Neuromuscular dysfunction of bladder, unspecified: Secondary | ICD-10-CM | POA: Diagnosis not present

## 2022-05-24 NOTE — Progress Notes (Signed)
PTNS  Session # 3 of 12  Health & Social Factors: No Caffeine: 3 Alcohol: none Daytime voids #per day: 5 Night-time voids #per night: 1 Urgency: yes Incontinence Episodes #per day: 3 Ankle used: left Treatment Setting: 10 Feeling/ Response: feeling in foot Comments: n/a  Performed By: Durenda Guthrie, LPN  Follow Up: Follow up as scheduled.

## 2022-05-31 ENCOUNTER — Ambulatory Visit (INDEPENDENT_AMBULATORY_CARE_PROVIDER_SITE_OTHER): Payer: Medicare HMO | Admitting: Urology

## 2022-05-31 DIAGNOSIS — R350 Frequency of micturition: Secondary | ICD-10-CM

## 2022-05-31 NOTE — Progress Notes (Signed)
PTNS  Session # 4  Health & Social Factors: - Caffeine: 2 Alcohol: 0 Daytime voids #per day: 5 Night-time voids #per night: 0 Urgency: yes Incontinence Episodes #per day: 4 Ankle used: right Treatment Setting: 7 Feeling/ Response: positive response Comments: N/A  Performed By: Marisue Brooklyn, CMA  Follow Up: Keep NV

## 2022-06-02 DIAGNOSIS — G35 Multiple sclerosis: Secondary | ICD-10-CM | POA: Diagnosis not present

## 2022-06-02 DIAGNOSIS — Z79620 Long term (current) use of immunosuppressive biologic: Secondary | ICD-10-CM | POA: Diagnosis not present

## 2022-06-06 ENCOUNTER — Ambulatory Visit (INDEPENDENT_AMBULATORY_CARE_PROVIDER_SITE_OTHER): Payer: Medicare HMO | Admitting: Urology

## 2022-06-06 DIAGNOSIS — R350 Frequency of micturition: Secondary | ICD-10-CM | POA: Diagnosis not present

## 2022-06-06 NOTE — Progress Notes (Signed)
PTNS  Session # 5 of 12  Health & Social Factors: no Caffeine: 3 Alcohol: none Daytime voids #per day: 5 Night-time voids #per night: ? Urgency: yes Incontinence Episodes #per day: 3 Ankle used: left Treatment Setting: 10 Feeling/ Response: positive sensation  Comments: n/a  Performed By: [facility] LPN  Follow Up: keep scheduled Nv

## 2022-06-07 ENCOUNTER — Ambulatory Visit: Payer: Medicare Other

## 2022-06-08 ENCOUNTER — Ambulatory Visit: Payer: Medicare Other

## 2022-06-12 ENCOUNTER — Other Ambulatory Visit: Payer: Self-pay | Admitting: Family Medicine

## 2022-06-14 ENCOUNTER — Ambulatory Visit: Payer: Medicare HMO | Admitting: Urology

## 2022-06-14 DIAGNOSIS — R350 Frequency of micturition: Secondary | ICD-10-CM | POA: Diagnosis not present

## 2022-06-14 NOTE — Progress Notes (Cosign Needed Addendum)
PTNS  Session # 6 of 12  Health & Social Factors: no Caffeine: 4 Alcohol: no Daytime voids #per day: 5 Night-time voids #per night: ? Urgency: yes Incontinence Episodes #per day: 4 Ankle used: left Treatment Setting: 12 Feeling/ Response: tingling in heel Comments: n/a  Performed By: Durenda Guthrie, LPN  Follow Up: Follow up as scheduled.

## 2022-06-21 ENCOUNTER — Ambulatory Visit: Payer: Medicare HMO | Admitting: Urology

## 2022-06-21 DIAGNOSIS — N319 Neuromuscular dysfunction of bladder, unspecified: Secondary | ICD-10-CM

## 2022-06-21 NOTE — Progress Notes (Signed)
PTNS  Session # 5 of 7  Health & Social Factors: no Caffeine: 2 Alcohol: none Daytime voids #per day: 5 Night-time voids #per night: 2 Urgency: yes Incontinence Episodes #per day: 3-4 Ankle used: left Treatment Setting: 6 Feeling/ Response: strong feeling in foot Comments: n/a  Performed By: Levi Aland, CMA  Follow Up: Follow up as scheduled.

## 2022-06-22 ENCOUNTER — Encounter: Payer: Self-pay | Admitting: Family Medicine

## 2022-06-22 ENCOUNTER — Ambulatory Visit: Payer: Medicare HMO | Admitting: Family Medicine

## 2022-06-22 VITALS — BP 128/73 | HR 101 | Temp 97.6°F | Ht 65.0 in | Wt 188.6 lb

## 2022-06-22 DIAGNOSIS — E039 Hypothyroidism, unspecified: Secondary | ICD-10-CM | POA: Diagnosis not present

## 2022-06-22 DIAGNOSIS — Z13220 Encounter for screening for lipoid disorders: Secondary | ICD-10-CM | POA: Diagnosis not present

## 2022-06-22 DIAGNOSIS — N951 Menopausal and female climacteric states: Secondary | ICD-10-CM | POA: Diagnosis not present

## 2022-06-22 NOTE — Progress Notes (Signed)
Subjective:  Patient ID: Amber Williams, female    DOB: [birth year]  Age: 47 y.o. MRN: [id number]  CC: Medical Management of Chronic Issues   HPI Amber Williams presents for  follow-up on  thyroid. The patient has a history of hypothyroidism for many years. It has been stable recently. Pt. denies any change in  voice, loss of hair, heat or cold intolerance. Energy level has been adequate to good. Patient denies constipation and diarrhea. No myxedema. Medication is as noted below. Verified that pt is taking it daily on an empty stomach. Well tolerated.  Having frequent flushing, hot flashes daily. Never had oopherectomy.      06/22/2022    1:25 PM 06/22/2022    1:15 PM 12/20/2021   12:56 PM  Depression screen PHQ 2/9  Decreased Interest 0 0 0  Down, Depressed, Hopeless 1 0 0  PHQ - 2 Score 1 0 0  Altered sleeping 1    Tired, decreased energy 1    Change in appetite 0    Feeling bad or failure about yourself  0    Trouble concentrating 0    Moving slowly or fidgety/restless 0    Suicidal thoughts 0    PHQ-9 Score 3    Difficult doing work/chores Not difficult at all      History Amber Williams has a past medical history of Anal cancer ([place]) (01/10/2019), Arthritis, Depression, DVT (deep venous thrombosis) ([place]) (2013), Essential hypertension, Family history of breast cancer, Gait abnormality (08/03/2016), GERD (gastroesophageal reflux disease) (08/03/2020), History of blood transfusion (2013), Hyperlipidemia, Hypothyroidism, Hypothyroidism, Multiple sclerosis ([place]), Neurogenic bladder (08/03/2016), Neuromuscular disorder ([place]) (1993), Port-A-Cath in place (01/23/2019), PTSD (post-traumatic stress disorder), PTSD (post-traumatic stress disorder), Right foot drop (08/03/2016), Seasonal allergies, and Ulcer.   She has a past surgical history that includes Colon surgery; Gastric bypass (2010); Interstim Implant placement (2014); Portacath placement; Hemorrhoid surgery (N/A, 12/25/2018);  Flexible sigmoidoscopy (N/A, 06/25/2019); Colonoscopy with propofol (N/A, 03/04/2020); Colonoscopy (N/A, 07/06/2020); Interstim Implant placement (N/A, 12/27/2020); Colonoscopy with propofol (N/A, 08/02/2021); Esophagogastroduodenoscopy (egd) with propofol (N/A, 08/17/2021); Esophageal dilation (N/A, 08/17/2021); biopsy (08/17/2021); and Interstim implant revision (N/A, 11/24/2021).   Her family history includes Alcohol abuse in her sister; Breast cancer (age of onset: 23) in her sister; Breast cancer (age of onset: 35) in her mother; Cancer in her mother, paternal grandmother, and sister; Diabetes in her father, maternal grandmother, and paternal grandmother; Heart disease in her maternal grandfather, maternal grandmother, and paternal grandfather; Hypertension in her father and sister; Miscarriages / Stillbirths in her sister; Stroke in her paternal grandfather.She reports that she quit smoking about 13 years ago. Her smoking use included cigarettes. She started smoking about 31 years ago. She has a 22.50 pack-year smoking history. She has never used smokeless tobacco. She reports that she does not currently use alcohol. She reports that she does not use drugs.    ROS Review of Systems  Constitutional: Negative.   HENT: Negative.    Eyes:  Negative for visual disturbance.  Respiratory:  Negative for shortness of breath.   Cardiovascular:  Negative for chest pain.  Gastrointestinal:  Negative for abdominal pain.  Musculoskeletal:  Negative for arthralgias.    Objective:  BP 128/73   Pulse (!) 101   Temp 97.6 F (36.4 C)   Ht '5\' 5"'$  (1.651 m)   Wt 188 lb 9.6 oz (85.5 kg)   SpO2 94%   BMI 31.38 kg/m   BP Readings from Last 3 Encounters:  06/22/22 128/73  03/06/22 131/66  02/27/22 (!) 104/49    Wt Readings from Last 3 Encounters:  06/22/22 188 lb 9.6 oz (85.5 kg)  02/27/22 182 lb 9.6 oz (82.8 kg)  12/20/21 184 lb 12.8 oz (83.8 kg)     Physical Exam Constitutional:      General:  She is not in acute distress.    Appearance: She is well-developed.  Cardiovascular:     Rate and Rhythm: Normal rate and regular rhythm.  Pulmonary:     Breath sounds: Normal breath sounds.  Musculoskeletal:        General: Normal range of motion.  Skin:    General: Skin is warm and dry.  Neurological:     Mental Status: She is alert and oriented to person, place, and time.       Assessment & Plan:   Amber Williams was seen today for medical management of chronic issues.  Diagnoses and all orders for this visit:  Acquired hypothyroidism -     CBC with Differential/Platelet -     CMP14+EGFR -     TSH + free T4 -     FSH/LH  Screening, lipid -     Lipid panel  Hot flashes due to menopause -     FSH/LH       I am having Amber Williams maintain her Vitamin D3, Cyanocobalamin, multivitamin with minerals, Vitamin A, mirabegron ER, famotidine, oxymetazoline, QUEtiapine, apixaban, omeprazole, PARoxetine, and levothyroxine.  Allergies as of 06/22/2022       Reactions   Silicone Hives   hives   Tecfidera [dimethyl Fumarate] Itching        Medication List        Accurate as of June 22, 2022 11:59 PM. If you have any questions, ask your nurse or doctor.          apixaban 5 MG Tabs tablet Commonly known as: Eliquis Take 1 tablet (5 mg total) by mouth 2 (two) times daily.   Cyanocobalamin 5000 MCG Tbdp Take 5,000 mcg by mouth daily.   famotidine 40 MG tablet Commonly known as: PEPCID Take 1 tablet (40 mg total) by mouth at bedtime as needed for heartburn or indigestion.   levothyroxine 112 MCG tablet Commonly known as: SYNTHROID TAKE 1 TABLET BY MOUTH DAILY BEFORE breakfast   mirabegron ER 50 MG Tb24 tablet Commonly known as: MYRBETRIQ Take 50 mg by mouth daily.   multivitamin with minerals tablet Take 1 tablet by mouth daily. One a day woman   omeprazole 40 MG capsule Commonly known as: PRILOSEC Take 1 capsule (40 mg total) by mouth daily.    oxymetazoline 0.05 % nasal spray Commonly known as: AFRIN Place 1 spray into both nostrils 2 (two) times daily as needed for congestion.   PARoxetine 20 MG tablet Commonly known as: PAXIL TAKE 1 TABLET BY MOUTH DAILY   QUEtiapine 50 MG tablet Commonly known as: SEROQUEL Take 1 tablet (50 mg total) by mouth at bedtime.   Vitamin A 2400 MCG (8000 UT) Caps Take 8,000 Units by mouth daily.   Vitamin D3 125 MCG (5000 UT) capsule Generic drug: Cholecalciferol Take 5,000 Units by mouth every morning.         Follow-up: Return in about 6 months (around 12/21/2022), or if symptoms worsen or fail to improve.  [name], M.D.

## 2022-06-23 ENCOUNTER — Encounter: Payer: Self-pay | Admitting: Family Medicine

## 2022-06-23 LAB — CBC WITH DIFFERENTIAL/PLATELET
Basophils Absolute: 0 10*3/uL (ref 0.0–0.2)
Basos: 1 %
EOS (ABSOLUTE): 0.1 10*3/uL (ref 0.0–0.4)
Eos: 1 %
Hematocrit: 36.4 % (ref 34.0–46.6)
Hemoglobin: 12.3 g/dL (ref 11.1–15.9)
Immature Grans (Abs): 0 10*3/uL (ref 0.0–0.1)
Immature Granulocytes: 0 %
Lymphocytes Absolute: 0.9 10*3/uL (ref 0.7–3.1)
Lymphs: 12 %
MCH: 31.5 pg (ref 26.6–33.0)
MCHC: 33.8 g/dL (ref 31.5–35.7)
MCV: 93 fL (ref 79–97)
Monocytes Absolute: 0.7 10*3/uL (ref 0.1–0.9)
Monocytes: 9 %
Neutrophils Absolute: 5.5 10*3/uL (ref 1.4–7.0)
Neutrophils: 77 %
Platelets: 367 10*3/uL (ref 150–450)
RBC: 3.9 x10E6/uL (ref 3.77–5.28)
RDW: 12.5 % (ref 11.7–15.4)
WBC: 7.3 10*3/uL (ref 3.4–10.8)

## 2022-06-23 LAB — LIPID PANEL
Chol/HDL Ratio: 2.8 ratio (ref 0.0–4.4)
Cholesterol, Total: 227 mg/dL — ABNORMAL HIGH (ref 100–199)
HDL: 80 mg/dL (ref >39)
LDL Chol Calc (NIH): 132 mg/dL — ABNORMAL HIGH (ref 0–99)
Triglycerides: 89 mg/dL (ref 0–149)
VLDL Cholesterol Cal: 15 mg/dL (ref 5–40)

## 2022-06-23 LAB — CMP14+EGFR
ALT: 14 IU/L (ref 0–32)
AST: 21 IU/L (ref 0–40)
Albumin/Globulin Ratio: 3.4 — ABNORMAL HIGH (ref 1.2–2.2)
Albumin: 4.4 g/dL (ref 3.9–4.9)
Alkaline Phosphatase: 140 IU/L — ABNORMAL HIGH (ref 44–121)
BUN/Creatinine Ratio: 9 (ref 9–23)
BUN: 9 mg/dL (ref 6–24)
Bilirubin Total: 0.2 mg/dL (ref 0.0–1.2)
CO2: 24 mmol/L (ref 20–29)
Calcium: 10.3 mg/dL — ABNORMAL HIGH (ref 8.7–10.2)
Chloride: 103 mmol/L (ref 96–106)
Creatinine, Ser: 1 mg/dL (ref 0.57–1.00)
Globulin, Total: 1.3 g/dL — ABNORMAL LOW (ref 1.5–4.5)
Glucose: 66 mg/dL — ABNORMAL LOW (ref 70–99)
Potassium: 4 mmol/L (ref 3.5–5.2)
Sodium: 141 mmol/L (ref 134–144)
Total Protein: 5.7 g/dL — ABNORMAL LOW (ref 6.0–8.5)
eGFR: 70 mL/min/{1.73_m2} (ref >59)

## 2022-06-23 LAB — TSH+FREE T4
Free T4: 1.38 ng/dL (ref 0.82–1.77)
TSH: 0.932 u[IU]/mL (ref 0.450–4.500)

## 2022-06-23 LAB — FSH/LH
FSH: 106 m[IU]/mL
LH: 57 m[IU]/mL

## 2022-06-24 ENCOUNTER — Encounter: Payer: Self-pay | Admitting: Family Medicine

## 2022-06-24 ENCOUNTER — Other Ambulatory Visit: Payer: Self-pay | Admitting: Family Medicine

## 2022-06-24 MED ORDER — ESTRADIOL-NORETHINDRONE ACET 1-0.5 MG PO TABS: 1.0000 | ORAL_TABLET | Freq: Every day | ORAL | 12 refills | Status: DC

## 2022-06-28 ENCOUNTER — Ambulatory Visit (INDEPENDENT_AMBULATORY_CARE_PROVIDER_SITE_OTHER): Payer: Medicare HMO | Admitting: Urology

## 2022-06-28 DIAGNOSIS — N319 Neuromuscular dysfunction of bladder, unspecified: Secondary | ICD-10-CM

## 2022-06-28 NOTE — Progress Notes (Signed)
PTNS  Session # 8 of 12  Health & Social Factors: none Caffeine: 3 Alcohol: 0 Daytime voids #per day: 5 Night-time voids #per night: ? Urgency: yes Incontinence Episodes #per day: 4 Ankle used: right Treatment Setting: 9 Feeling/ Response: tingling in heel Comments: n/a  Performed By: Levi Aland, CMA  Follow Up: Follow up as scheduled.

## 2022-07-05 ENCOUNTER — Ambulatory Visit: Payer: Medicare Other

## 2022-07-05 DIAGNOSIS — G35 Multiple sclerosis: Secondary | ICD-10-CM | POA: Diagnosis not present

## 2022-07-05 DIAGNOSIS — M21371 Foot drop, right foot: Secondary | ICD-10-CM | POA: Diagnosis not present

## 2022-07-05 DIAGNOSIS — G4701 Insomnia due to medical condition: Secondary | ICD-10-CM | POA: Diagnosis not present

## 2022-07-06 ENCOUNTER — Ambulatory Visit (INDEPENDENT_AMBULATORY_CARE_PROVIDER_SITE_OTHER): Payer: Medicare HMO | Admitting: Urology

## 2022-07-06 DIAGNOSIS — R350 Frequency of micturition: Secondary | ICD-10-CM | POA: Diagnosis not present

## 2022-07-06 DIAGNOSIS — N319 Neuromuscular dysfunction of bladder, unspecified: Secondary | ICD-10-CM

## 2022-07-06 NOTE — Progress Notes (Signed)
PTNS  Session # 9 of 12  Health & Social Factors: no  Caffeine: 3 Alcohol: none Daytime voids #per day: 5 Night-time voids #per night: ? Urgency: yes Incontinence Episodes #per day: 4 Ankle used: left Treatment Setting: 5 Feeling/ Response: Positive Response  Performed By: Marisue Brooklyn, CMA/[facility], LPN  Follow Up: Keep nurse visit

## 2022-07-07 ENCOUNTER — Other Ambulatory Visit: Payer: Self-pay | Admitting: Family Medicine

## 2022-07-07 DIAGNOSIS — M21371 Foot drop, right foot: Secondary | ICD-10-CM | POA: Diagnosis not present

## 2022-07-07 DIAGNOSIS — G5731 Lesion of lateral popliteal nerve, right lower limb: Secondary | ICD-10-CM | POA: Diagnosis not present

## 2022-07-07 DIAGNOSIS — G35 Multiple sclerosis: Secondary | ICD-10-CM | POA: Diagnosis not present

## 2022-07-08 DIAGNOSIS — S60411A Abrasion of left index finger, initial encounter: Secondary | ICD-10-CM | POA: Diagnosis not present

## 2022-07-09 DIAGNOSIS — G5731 Lesion of lateral popliteal nerve, right lower limb: Secondary | ICD-10-CM | POA: Diagnosis not present

## 2022-07-09 DIAGNOSIS — G35 Multiple sclerosis: Secondary | ICD-10-CM | POA: Diagnosis not present

## 2022-07-09 DIAGNOSIS — M21371 Foot drop, right foot: Secondary | ICD-10-CM | POA: Diagnosis not present

## 2022-07-11 ENCOUNTER — Ambulatory Visit (INDEPENDENT_AMBULATORY_CARE_PROVIDER_SITE_OTHER): Payer: Medicare HMO | Admitting: Urology

## 2022-07-11 DIAGNOSIS — R350 Frequency of micturition: Secondary | ICD-10-CM | POA: Diagnosis not present

## 2022-07-11 NOTE — Progress Notes (Signed)
PTNS  Session # 10 of 12  Health & Social Factors: none Caffeine: 2  Alcohol: 0 Daytime voids #per day: 4 Night-time voids #per night: 2 (?) Urgency: yes Incontinence Episodes #per day: 4 Ankle used: left Treatment Setting: 11 Feeling/ Response: positive sensation in whole foot Comments: n/a   Performed By: [place] LPN  Follow Up: Keep scheduled NV

## 2022-07-12 ENCOUNTER — Ambulatory Visit: Payer: Medicare Other

## 2022-07-19 ENCOUNTER — Ambulatory Visit: Payer: Medicare HMO | Admitting: Urology

## 2022-07-19 DIAGNOSIS — N390 Urinary tract infection, site not specified: Secondary | ICD-10-CM

## 2022-07-19 DIAGNOSIS — N3000 Acute cystitis without hematuria: Secondary | ICD-10-CM | POA: Diagnosis not present

## 2022-07-19 LAB — URINALYSIS, ROUTINE W REFLEX MICROSCOPIC
Bilirubin, UA: NEGATIVE
Glucose, UA: NEGATIVE
Ketones, UA: NEGATIVE
Nitrite, UA: NEGATIVE
Protein,UA: NEGATIVE
Specific Gravity, UA: 1.015 (ref 1.005–1.030)
Urobilinogen, Ur: 0.2 mg/dL (ref 0.2–1.0)
pH, UA: 6 (ref 5.0–7.5)

## 2022-07-19 LAB — MICROSCOPIC EXAMINATION: WBC, UA: >30 /hpf — AB (ref 0–5)

## 2022-07-19 MED ORDER — NITROFURANTOIN MONOHYD MACRO 100 MG PO CAPS: 100.0000 mg | ORAL_CAPSULE | Freq: Two times a day (BID) | ORAL | 0 refills | Status: DC

## 2022-07-19 NOTE — Progress Notes (Signed)
Patient presents today with complaints of  UTI SXS.  UA and Culture done today.  Dr. Alyson Ingles  reviewed results and Treatment started 07/19/22. Patient aware of MD recommendations and that we will reach out with culture results.      [id number], [place]

## 2022-07-19 NOTE — Progress Notes (Signed)
PTNS  Session # 11 of 12  Health & Social Factors: none Caffeine: 3 Alcohol: none Daytime voids #per day: 5 Night-time voids #per night: ? Urgency: yes Incontinence Episodes #per day: 4-5 times Ankle used: left Treatment Setting: 9 Feeling/ Response: toe curl Comments: n/a  Performed By: Kiyana Vazguez LPN  Follow Up: keep scheduled NV   Pt complain of uti symptoms ua obtained

## 2022-07-21 LAB — URINE CULTURE

## 2022-07-25 ENCOUNTER — Ambulatory Visit (INDEPENDENT_AMBULATORY_CARE_PROVIDER_SITE_OTHER): Payer: Medicare HMO | Admitting: Urology

## 2022-07-25 DIAGNOSIS — R350 Frequency of micturition: Secondary | ICD-10-CM | POA: Diagnosis not present

## 2022-07-25 NOTE — Progress Notes (Signed)
PTNS  Session # 12 of 12  Health & Social Factors: none Caffeine: 2-3 Alcohol: none Daytime voids #per day: 4-5 Night-time voids #per night: ? Urgency: yes Incontinence Episodes #per day: 4 Ankle used: right Treatment Setting: 7 Feeling/ Response: positive sensation Comments: n/a  Performed By: [facility] LPN  Follow Up: keep scheduled OV

## 2022-07-26 ENCOUNTER — Ambulatory Visit: Payer: Medicare Other

## 2022-07-27 ENCOUNTER — Telehealth: Payer: Self-pay

## 2022-07-27 NOTE — Telephone Encounter (Signed)
Spoke with patient and made her aware that Dr. Alyson Ingles would like to see her in the office to assess her incision.  Appointment made.

## 2022-07-28 DIAGNOSIS — L98411 Non-pressure chronic ulcer of buttock limited to breakdown of skin: Secondary | ICD-10-CM | POA: Diagnosis not present

## 2022-07-28 DIAGNOSIS — S31809A Unspecified open wound of unspecified buttock, initial encounter: Secondary | ICD-10-CM | POA: Diagnosis not present

## 2022-08-02 DIAGNOSIS — G4701 Insomnia due to medical condition: Secondary | ICD-10-CM | POA: Diagnosis not present

## 2022-08-02 DIAGNOSIS — M21371 Foot drop, right foot: Secondary | ICD-10-CM | POA: Diagnosis not present

## 2022-08-02 DIAGNOSIS — G35 Multiple sclerosis: Secondary | ICD-10-CM | POA: Diagnosis not present

## 2022-08-09 ENCOUNTER — Ambulatory Visit (INDEPENDENT_AMBULATORY_CARE_PROVIDER_SITE_OTHER): Payer: Medicare HMO | Admitting: Urology

## 2022-08-09 VITALS — BP 137/84 | HR 101

## 2022-08-09 DIAGNOSIS — L732 Hidradenitis suppurativa: Secondary | ICD-10-CM | POA: Diagnosis not present

## 2022-08-09 DIAGNOSIS — N3000 Acute cystitis without hematuria: Secondary | ICD-10-CM | POA: Diagnosis not present

## 2022-08-09 LAB — URINALYSIS, ROUTINE W REFLEX MICROSCOPIC
Bilirubin, UA: NEGATIVE
Glucose, UA: NEGATIVE
Ketones, UA: NEGATIVE
Nitrite, UA: NEGATIVE
Protein,UA: NEGATIVE
RBC, UA: NEGATIVE
Specific Gravity, UA: 1.015 (ref 1.005–1.030)
Urobilinogen, Ur: 0.2 mg/dL (ref 0.2–1.0)
pH, UA: 6 (ref 5.0–7.5)

## 2022-08-09 LAB — MICROSCOPIC EXAMINATION
Bacteria, UA: NONE SEEN
Epithelial Cells (non renal): >10 /hpf — AB (ref 0–10)
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
## 2022-08-14 ENCOUNTER — Encounter: Payer: Self-pay | Admitting: Urology

## 2022-08-15 ENCOUNTER — Encounter: Payer: Self-pay | Admitting: Urology

## 2022-08-15 DIAGNOSIS — N399 Disorder of urinary system, unspecified: Secondary | ICD-10-CM | POA: Diagnosis not present

## 2022-08-15 DIAGNOSIS — G35 Multiple sclerosis: Secondary | ICD-10-CM | POA: Diagnosis not present

## 2022-08-15 DIAGNOSIS — R531 Weakness: Secondary | ICD-10-CM | POA: Diagnosis not present

## 2022-08-15 NOTE — Patient Instructions (Signed)
Hidradenitis Suppurativa Hidradenitis suppurativa is a long-term (chronic) skin disease. It is similar to a severe form of acne, but it affects areas of the body where acne would be unusual, especially areas of the body where skin rubs against skin and becomes moist. These include: Underarms. Groin. Genital area. Buttocks. Upper thighs. Breasts. Hidradenitis suppurativa may start out as small lumps or pimples caused by blocked skin pores, sweat glands, or hair follicles. Pimples may develop into deep sores that break open (rupture) and drain pus. Over time, affected areas of skin may thicken and become scarred. This condition is rare and does not spread from person to person (non-contagious). What are the causes? The exact cause of this condition is not known. It may be related to: Female and female hormones. An overactive disease-fighting system (immune system). The immune system may over-react to blocked hair follicles or sweat glands and cause swelling and pus-filled sores. What increases the risk? You are more likely to develop this condition if you: Are female. Are 11-55 years old. Have a family history of hidradenitis suppurativa. Have a personal history of acne. Are overweight. Smoke. Take the medicine lithium. What are the signs or symptoms? The first symptoms are usually painful bumps in the skin, similar to pimples. The condition may get worse over time (progress), or it may only cause mild symptoms. If the disease progresses, symptoms may include: Skin bumps getting bigger and growing deeper into the skin. Bumps rupturing and draining pus. Itchy, infected skin. Skin getting thicker and scarred. Tunnels under the skin (fistulas) where pus drains from a bump. Pain during daily activities, such as pain during walking if your groin area is affected. Emotional problems, such as stress or depression. This condition may affect your appearance and your ability or willingness to wear  certain clothes or do certain activities. How is this diagnosed? This condition is diagnosed by a health care provider who specializes in skin conditions (dermatologist). You may be diagnosed based on: Your symptoms and medical history. A physical exam. Testing a pus sample for infection. Blood tests. How is this treated? Your treatment will depend on how severe your symptoms are. The same treatment will not work for everybody with this condition. You may need to try several treatments to find what works best for you. Treatment may include: Cleaning and bandaging (dressing) your wounds as needed. Lifestyle changes, such as new skin care routines. Taking medicines, such as: Antibiotics. Acne medicines. Medicines to reduce the activity of the immune system. A diabetes medicine (metformin). Birth control pills, for women. Steroids to reduce swelling and pain. Working with a mental health care provider, if you experience emotional distress due to this condition. If you have severe symptoms that do not get better with medicine, you may need surgery. Surgery may involve: Using a laser to clear the skin and remove hair follicles. Opening and draining deep sores. Removing the areas of skin that are diseased and scarred. Follow these instructions at home: Medicines  Take over-the-counter and prescription medicines only as told by your health care provider. If you were prescribed antibiotics, take them as told by your health care provider. Do not stop using the antibiotic even if your condition improves. Skin care If you have open wounds, cover them with a clean dressing as told by your health care provider. Keep wounds clean by washing them gently with soap and water when you bathe. Do not shave the areas where you get hidradenitis suppurativa. Wear loose-fitting clothes. Try to avoid   getting overheated or sweaty. If you get sweaty or wet, change into clean, dry clothes as soon as you can. To  help relieve pain and itchiness, cover sore areas with a warm, clean washcloth (warm compress) for 5-10 minutes as often as needed. Your healthcare provider may recommend an antiperspirant deodorant that may be gentle on your skin. A daily antiseptic wash to cleanse affected areas may be suggested by your healthcare provider. General instructions Learn as much as you can about your disease so that you have an active role in your treatment. Work closely with your health care provider to find treatments that work for you. If you are overweight, work with your health care provider to lose weight as recommended. Do not use any products that contain nicotine or tobacco. These products include cigarettes, chewing tobacco, and vaping devices, such as e-cigarettes. If you need help quitting, ask your health care provider. If you struggle with living with this condition, talk with your health care provider or work with a mental health care provider as recommended. Keep all follow-up visits. Where to find more information Hidradenitis Suppurativa Foundation[employer] Inc.: www.hs-foundation.org American Academy of Dermatology: www.aad.org Contact a health care provider if: You have a flare-up of hidradenitis suppurativa. You have a fever or chills. You have trouble controlling your symptoms at home. You have trouble doing your daily activities because of your symptoms. You have trouble dealing with emotional problems related to your condition. Summary Hidradenitis suppurativa is a long-term (chronic) skin disease. It is similar to a severe form of acne, but it affects areas of the body where acne would be unusual. The first symptoms are usually painful bumps in the skin, similar to pimples. The condition may only cause mild symptoms, or it may get worse over time (progress). If you have open wounds, cover them with a clean dressing as told by your health care provider. Keep wounds clean by washing them gently with  soap and water when you bathe. Besides skin care, treatment may include medicines, laser treatment, and surgery. This information is not intended to replace advice given to you by your health care provider. Make sure you discuss any questions you have with your health care provider. Document Revised: 06/08/2021 Document Reviewed: 06/08/2021 Elsevier Patient Education  2023 Elsevier Inc.  

## 2022-08-16 ENCOUNTER — Ambulatory Visit: Payer: Medicare HMO | Admitting: Urology

## 2022-08-18 DIAGNOSIS — L03317 Cellulitis of buttock: Secondary | ICD-10-CM | POA: Diagnosis not present

## 2022-08-18 DIAGNOSIS — Z86718 Personal history of other venous thrombosis and embolism: Secondary | ICD-10-CM | POA: Diagnosis not present

## 2022-08-18 DIAGNOSIS — L03116 Cellulitis of left lower limb: Secondary | ICD-10-CM | POA: Diagnosis not present

## 2022-08-18 DIAGNOSIS — G35 Multiple sclerosis: Secondary | ICD-10-CM | POA: Diagnosis not present

## 2022-08-18 DIAGNOSIS — L0231 Cutaneous abscess of buttock: Secondary | ICD-10-CM | POA: Diagnosis not present

## 2022-08-18 DIAGNOSIS — Z87891 Personal history of nicotine dependence: Secondary | ICD-10-CM | POA: Diagnosis not present

## 2022-08-22 ENCOUNTER — Ambulatory Visit: Payer: Medicare HMO | Admitting: Family

## 2022-08-22 ENCOUNTER — Ambulatory Visit (HOSPITAL_COMMUNITY): Payer: Medicare HMO

## 2022-08-22 ENCOUNTER — Inpatient Hospital Stay: Payer: Medicare HMO

## 2022-08-29 ENCOUNTER — Ambulatory Visit: Payer: Medicare Other | Admitting: Hematology

## 2022-08-31 ENCOUNTER — Ambulatory Visit: Payer: Medicare HMO | Admitting: General Surgery

## 2022-09-05 DIAGNOSIS — J019 Acute sinusitis, unspecified: Secondary | ICD-10-CM | POA: Diagnosis not present

## 2022-09-09 ENCOUNTER — Other Ambulatory Visit: Payer: Self-pay | Admitting: Family Medicine

## 2022-09-09 DIAGNOSIS — G4700 Insomnia, unspecified: Secondary | ICD-10-CM

## 2022-09-12 DIAGNOSIS — T380X5A Adverse effect of glucocorticoids and synthetic analogues, initial encounter: Secondary | ICD-10-CM | POA: Diagnosis not present

## 2022-09-12 DIAGNOSIS — F329 Major depressive disorder, single episode, unspecified: Secondary | ICD-10-CM | POA: Diagnosis not present

## 2022-09-12 DIAGNOSIS — G35 Multiple sclerosis: Secondary | ICD-10-CM | POA: Diagnosis not present

## 2022-09-12 DIAGNOSIS — R531 Weakness: Secondary | ICD-10-CM | POA: Diagnosis not present

## 2022-09-12 DIAGNOSIS — M21371 Foot drop, right foot: Secondary | ICD-10-CM | POA: Diagnosis not present

## 2022-09-12 DIAGNOSIS — F419 Anxiety disorder, unspecified: Secondary | ICD-10-CM | POA: Diagnosis not present

## 2022-09-12 DIAGNOSIS — F32A Depression, unspecified: Secondary | ICD-10-CM | POA: Diagnosis not present

## 2022-09-12 DIAGNOSIS — Z634 Disappearance and death of family member: Secondary | ICD-10-CM | POA: Diagnosis not present

## 2022-09-12 DIAGNOSIS — J019 Acute sinusitis, unspecified: Secondary | ICD-10-CM | POA: Diagnosis not present

## 2022-09-12 DIAGNOSIS — Z85038 Personal history of other malignant neoplasm of large intestine: Secondary | ICD-10-CM | POA: Diagnosis not present

## 2022-09-12 DIAGNOSIS — D72829 Elevated white blood cell count, unspecified: Secondary | ICD-10-CM | POA: Diagnosis not present

## 2022-09-12 DIAGNOSIS — E039 Hypothyroidism, unspecified: Secondary | ICD-10-CM | POA: Diagnosis not present

## 2022-09-12 DIAGNOSIS — R29810 Facial weakness: Secondary | ICD-10-CM | POA: Diagnosis not present

## 2022-09-18 ENCOUNTER — Encounter: Payer: Self-pay | Admitting: Urology

## 2022-09-18 ENCOUNTER — Other Ambulatory Visit: Payer: Self-pay | Admitting: Family Medicine

## 2022-09-24 DIAGNOSIS — G35 Multiple sclerosis: Secondary | ICD-10-CM | POA: Diagnosis not present

## 2022-09-24 DIAGNOSIS — G5731 Lesion of lateral popliteal nerve, right lower limb: Secondary | ICD-10-CM | POA: Diagnosis not present

## 2022-09-24 DIAGNOSIS — M21371 Foot drop, right foot: Secondary | ICD-10-CM | POA: Diagnosis not present

## 2022-09-25 DIAGNOSIS — G35 Multiple sclerosis: Secondary | ICD-10-CM | POA: Diagnosis not present

## 2022-09-25 DIAGNOSIS — M21371 Foot drop, right foot: Secondary | ICD-10-CM | POA: Diagnosis not present

## 2022-09-25 DIAGNOSIS — G5731 Lesion of lateral popliteal nerve, right lower limb: Secondary | ICD-10-CM | POA: Diagnosis not present

## 2022-10-01 DIAGNOSIS — H6092 Unspecified otitis externa, left ear: Secondary | ICD-10-CM | POA: Diagnosis not present

## 2022-10-01 DIAGNOSIS — S80812A Abrasion, left lower leg, initial encounter: Secondary | ICD-10-CM | POA: Diagnosis not present

## 2022-10-01 DIAGNOSIS — J189 Pneumonia, unspecified organism: Secondary | ICD-10-CM | POA: Diagnosis not present

## 2022-10-12 ENCOUNTER — Encounter: Payer: Self-pay | Admitting: Family Medicine

## 2022-10-12 ENCOUNTER — Ambulatory Visit (INDEPENDENT_AMBULATORY_CARE_PROVIDER_SITE_OTHER): Payer: Medicare HMO | Admitting: Family Medicine

## 2022-10-12 VITALS — BP 114/66 | HR 97 | Temp 98.1°F | Ht 65.0 in | Wt 178.0 lb

## 2022-10-12 DIAGNOSIS — M791 Myalgia, unspecified site: Secondary | ICD-10-CM | POA: Diagnosis not present

## 2022-10-12 DIAGNOSIS — J189 Pneumonia, unspecified organism: Secondary | ICD-10-CM | POA: Diagnosis not present

## 2022-10-12 DIAGNOSIS — H7292 Unspecified perforation of tympanic membrane, left ear: Secondary | ICD-10-CM

## 2022-10-12 MED ORDER — CYCLOBENZAPRINE HCL 10 MG PO TABS
10.0000 mg | ORAL_TABLET | Freq: Three times a day (TID) | ORAL | 0 refills | Status: DC | PRN
Start: 2022-10-12 — End: 2022-11-22

## 2022-10-12 MED ORDER — DOXYCYCLINE HYCLATE 100 MG PO TABS
100.0000 mg | ORAL_TABLET | Freq: Two times a day (BID) | ORAL | 0 refills | Status: AC
Start: 2022-10-12 — End: 2022-10-19

## 2022-10-12 MED ORDER — PREDNISONE 10 MG (21) PO TBPK
ORAL_TABLET | ORAL | 0 refills | Status: DC
Start: 2022-10-12 — End: 2022-11-20

## 2022-10-12 NOTE — Patient Instructions (Signed)
Eardrum Rupture, Adult  An eardrum rupture is a hole (perforation) in the eardrum. The eardrum is a thin, round tissue inside of the ear that separates the ear canal from the middle ear. The eardrum is also called the tympanic membrane. It transfers sound vibrations through small bones in the middle ear to the hearing nerve in the inner ear. It also protects the middle ear from germs. An eardrum rupture can cause pain and hearing loss. What are the causes? This condition may be caused by: An infection. A sudden injury, such as from: Inserting a thin, sharp object into the ear. A hit to the side of the head, especially by an open hand. Falling onto water or a flat surface. A rapid change in pressure, such as from flying or scuba diving. A sudden increase in pressure against the eardrum, such as from an explosion or a very loud noise. Inserting a cotton-tipped swab in the ear. A long-term eustachian tube disorder. Eustachian tubes are parts of the body that connect each middle ear space to the back of the nose. A medical procedure or surgery, such as a procedure to remove wax from the ear canal. Removing a pressure equalization tube(PE tube) that was surgically placed through the eardrum. Having a PE tube fall out. What increases the risk? You are more likely to develop this condition if: You have had PE tubes inserted in your ears. You have an ear infection. You play sports that: Involve balls or contact with other players. Take place in water, such as diving, scuba diving, or waterskiing. What are the signs or symptoms? Symptoms of this condition include: Sudden pain at the time of the injury. Ear pain that suddenly improves. Ringing in the ear after the injury. Drainage from the ear. The drainage may be clear, cloudy or pus-like, or bloody. Hearing loss. Dizziness. How is this diagnosed? This condition is diagnosed based on your symptoms and medical history as well as a physical  exam. Your health care provider can usually see a perforation using an ear scope (otoscope). You may have tests, such as: A hearing test (audiogram) to check for hearing loss. A test in which a sample of ear drainage is tested for infection (culture). How is this treated? An eardrum typically heals on its own within a few weeks. If your eardrum does not heal, your health care provider may recommend a procedure to place a patch over your eardrum or surgery to repair your eardrum. Your health care provider may also prescribe antibiotic medicines to help prevent infection. If the ear heals completely, any hearing loss should be temporary. Follow these instructions at home: Medicines Take over-the-counter and prescription medicines only as told by your health care provider. If you were prescribed an antibiotic medicine, use it as told by your health care provider. Do not stop using the antibiotic even if you start to feel better. Ear care Keep your ear dry. This is very important. Follow instructions from your health care provider about how to keep your ear dry. You may need to wear waterproof earplugs when bathing and swimming. If directed, apply heat to your affected ear as often as told by your health care provider. Use the heat source that your health care provider recommends, such as a moist heat pack or a heating pad. This will help to relieve pain. Place a towel between your skin and the heat source. Leave the heat on for 20-30 minutes. Remove the heat if your skin turns bright red.   This is especially important if you are unable to feel pain, heat, or cold. You have a greater risk of getting burned. General instructions Return to sports and activities as told by your health care provider. Ask your health care provider what activities are safe for you. Wear headgear with ear protection when you play sports in which ear injuries are common. Talk to your health care provider before traveling by  plane. Keep all follow-up visits. This is important. Contact a health care provider if: You have a fever. You have ear pain. You have mucus or blood draining from your ear. You have hearing loss, dizziness, or ringing in your ear. Get help right away if: You have sudden hearing loss. You are very dizzy. You have severe ear pain. Your face feels weak or becomes limp (paralyzed). These symptoms may represent a serious problem that is an emergency. Do not wait to see if the symptoms will go away. Get medical help right away. Call your local emergency services (911 in the [place] Do not drive yourself to the hospital. Summary An eardrum rupture is a hole (perforation) in the eardrum that can cause pain and hearing loss. It is usually caused by a sudden injury to the ear. The eardrum will likely heal on its own within a few weeks. In some cases, surgery may be necessary. Follow instructions from your health care provider about how to keep your ear dry as it heals. This information is not intended to replace advice given to you by your health care provider. Make sure you discuss any questions you have with your health care provider. Document Revised: 03/08/2020 Document Reviewed: 03/08/2020 Elsevier Patient Education  2024 [employer].

## 2022-10-12 NOTE — Progress Notes (Signed)
Acute Office Visit  Subjective:     Patient ID: Amber Williams, female    DOB: [birth year], 47 y.o.   MRN: [id number]  Chief Complaint  Patient presents with   Follow-up     Pneumonia, night sweats, possible busted left eardrum     HPI Patient is in today for follow up for pneumonia. She was seen at a [facility] in [place] on 10/01/22. She reports that CXR showed pneumonia. Denies chest pain, shortness of breath, or fever. She does feel somewhat better. She completed doxycycline yesterday. She feels fatigued and still has productive cough with light yellow sputum. She has been taking tessalon perles without improvement. She reprots that she had a popping sensation with blood from her left ear on 10/01/22. She was given ofloxicin otic for this. She reports muffled hearing and some occasional discomfort. No drainage from ear. She also has continued nasal congestion that has worsened for the last few days. Denies sore throat, nausea, diarrhea, HA, or facial pain.   She also reports muscle tension in her neck and shoulders. Denies spasms or injury.   Hx of MS, on ocrevus infusions.   ROS As per HPI.      Objective:    BP 114/66   Pulse 97   Temp 98.1 F (36.7 C) (Oral)   Ht 5\' 5"  (1.651 m)   Wt 178 lb (80.7 kg)   SpO2 97%   BMI 29.62 kg/m    Physical Exam Vitals and nursing note reviewed.  Constitutional:      General: She is not in acute distress.    Appearance: Normal appearance. She is not ill-appearing, toxic-appearing or diaphoretic.  HENT:     Right Ear: Tympanic membrane, ear canal and external ear normal.     Left Ear: Ear canal and external ear normal. Tympanic membrane is perforated. Tympanic membrane is not erythematous, retracted or bulging.     Nose: Congestion present.     Mouth/Throat:     Mouth: Mucous membranes are moist.     Pharynx: Oropharynx is clear.  Eyes:     General: No scleral icterus.       Right eye: No discharge.        Left eye: No discharge.      Extraocular Movements: Extraocular movements intact.     Conjunctiva/sclera: Conjunctivae normal.  Cardiovascular:     Rate and Rhythm: Normal rate and regular rhythm.     Heart sounds: Normal heart sounds. No murmur heard. Pulmonary:     Effort: Pulmonary effort is normal. No respiratory distress.     Breath sounds: Normal breath sounds. No wheezing, rhonchi or rales.  Musculoskeletal:     Cervical back: Neck supple. Tenderness (muscular) present. No rigidity.     Right lower leg: No edema.     Left lower leg: No edema.  Skin:    General: Skin is warm.  Neurological:     General: No focal deficit present.     Mental Status: She is alert and oriented to person, place, and time.  Psychiatric:        Mood and Affect: Mood normal.        Behavior: Behavior normal.     No results found for any visits on 10/12/22.      Assessment & Plan:   Amber Williams was seen today for follow-up.  Diagnoses and all orders for this visit:  Community acquired pneumonia, unspecified laterality Some improvement in symptoms, but still reports productive cough.  Lungs clear on exam. Another round of doxycyline ordered with prednisone dose pack.  -     doxycycline (VIBRA-TABS) 100 MG tablet; Take 1 tablet (100 mg total) by mouth 2 (two) times daily for 7 days. -     predniSONE (STERAPRED UNI-PAK 21 TAB) 10 MG (21) TBPK tablet; Use as directed on back of pill pack  Tympanic membrane rupture, left Discussed typically self resolves. Discussed prevention of infection. She has follow up with PCP in 2 months.   Muscle tension pain -     cyclobenzaprine (FLEXERIL) 10 MG tablet; Take 1 tablet (10 mg total) by mouth 3 (three) times daily as needed for muscle spasms.   Return if symptoms worsen or fail to improve.  The patient indicates understanding of these issues and agrees with the plan.   Gabriel Earing, FNP

## 2022-10-23 ENCOUNTER — Encounter: Payer: Self-pay | Admitting: Urology

## 2022-11-05 DIAGNOSIS — I5020 Unspecified systolic (congestive) heart failure: Secondary | ICD-10-CM | POA: Diagnosis not present

## 2022-11-05 DIAGNOSIS — I82501 Chronic embolism and thrombosis of unspecified deep veins of right lower extremity: Secondary | ICD-10-CM | POA: Diagnosis not present

## 2022-11-05 DIAGNOSIS — E871 Hypo-osmolality and hyponatremia: Secondary | ICD-10-CM | POA: Diagnosis not present

## 2022-11-05 DIAGNOSIS — R918 Other nonspecific abnormal finding of lung field: Secondary | ICD-10-CM | POA: Diagnosis not present

## 2022-11-05 DIAGNOSIS — E039 Hypothyroidism, unspecified: Secondary | ICD-10-CM | POA: Diagnosis not present

## 2022-11-05 DIAGNOSIS — M8588 Other specified disorders of bone density and structure, other site: Secondary | ICD-10-CM | POA: Diagnosis not present

## 2022-11-05 DIAGNOSIS — J984 Other disorders of lung: Secondary | ICD-10-CM | POA: Diagnosis not present

## 2022-11-05 DIAGNOSIS — F411 Generalized anxiety disorder: Secondary | ICD-10-CM | POA: Diagnosis not present

## 2022-11-05 DIAGNOSIS — F419 Anxiety disorder, unspecified: Secondary | ICD-10-CM | POA: Diagnosis not present

## 2022-11-05 DIAGNOSIS — I4711 Inappropriate sinus tachycardia, so stated: Secondary | ICD-10-CM | POA: Diagnosis not present

## 2022-11-05 DIAGNOSIS — R9431 Abnormal electrocardiogram [ECG] [EKG]: Secondary | ICD-10-CM | POA: Diagnosis not present

## 2022-11-05 DIAGNOSIS — R000 Tachycardia, unspecified: Secondary | ICD-10-CM | POA: Diagnosis not present

## 2022-11-05 DIAGNOSIS — D75839 Thrombocytosis, unspecified: Secondary | ICD-10-CM | POA: Diagnosis not present

## 2022-11-05 DIAGNOSIS — R3981 Functional urinary incontinence: Secondary | ICD-10-CM | POA: Diagnosis not present

## 2022-11-05 DIAGNOSIS — R059 Cough, unspecified: Secondary | ICD-10-CM | POA: Diagnosis not present

## 2022-11-05 DIAGNOSIS — I429 Cardiomyopathy, unspecified: Secondary | ICD-10-CM | POA: Diagnosis not present

## 2022-11-05 DIAGNOSIS — Z20822 Contact with and (suspected) exposure to covid-19: Secondary | ICD-10-CM | POA: Diagnosis not present

## 2022-11-05 DIAGNOSIS — I5189 Other ill-defined heart diseases: Secondary | ICD-10-CM | POA: Diagnosis not present

## 2022-11-05 DIAGNOSIS — I509 Heart failure, unspecified: Secondary | ICD-10-CM | POA: Diagnosis not present

## 2022-11-05 DIAGNOSIS — N390 Urinary tract infection, site not specified: Secondary | ICD-10-CM | POA: Diagnosis not present

## 2022-11-05 DIAGNOSIS — G35 Multiple sclerosis: Secondary | ICD-10-CM | POA: Diagnosis not present

## 2022-11-05 DIAGNOSIS — A419 Sepsis, unspecified organism: Secondary | ICD-10-CM | POA: Diagnosis not present

## 2022-11-05 DIAGNOSIS — J439 Emphysema, unspecified: Secondary | ICD-10-CM | POA: Diagnosis not present

## 2022-11-05 DIAGNOSIS — E8770 Fluid overload, unspecified: Secondary | ICD-10-CM | POA: Diagnosis not present

## 2022-11-05 DIAGNOSIS — J189 Pneumonia, unspecified organism: Secondary | ICD-10-CM | POA: Diagnosis not present

## 2022-11-05 DIAGNOSIS — I5021 Acute systolic (congestive) heart failure: Secondary | ICD-10-CM | POA: Diagnosis not present

## 2022-11-05 DIAGNOSIS — F329 Major depressive disorder, single episode, unspecified: Secondary | ICD-10-CM | POA: Diagnosis not present

## 2022-11-05 DIAGNOSIS — C210 Malignant neoplasm of anus, unspecified: Secondary | ICD-10-CM | POA: Diagnosis not present

## 2022-11-05 DIAGNOSIS — R042 Hemoptysis: Secondary | ICD-10-CM | POA: Diagnosis not present

## 2022-11-08 ENCOUNTER — Ambulatory Visit: Payer: Medicare HMO | Admitting: Urology

## 2022-11-08 ENCOUNTER — Ambulatory Visit: Payer: Medicare HMO | Admitting: Family Medicine

## 2022-11-09 DIAGNOSIS — R042 Hemoptysis: Secondary | ICD-10-CM | POA: Diagnosis not present

## 2022-11-09 DIAGNOSIS — J189 Pneumonia, unspecified organism: Secondary | ICD-10-CM | POA: Diagnosis not present

## 2022-11-09 DIAGNOSIS — E8770 Fluid overload, unspecified: Secondary | ICD-10-CM | POA: Diagnosis not present

## 2022-11-17 ENCOUNTER — Encounter: Payer: Self-pay | Admitting: Family Medicine

## 2022-11-17 NOTE — Telephone Encounter (Signed)
Appointment moved to 07/22

## 2022-11-20 ENCOUNTER — Encounter: Payer: Self-pay | Admitting: Family Medicine

## 2022-11-20 ENCOUNTER — Ambulatory Visit (INDEPENDENT_AMBULATORY_CARE_PROVIDER_SITE_OTHER): Payer: Medicare HMO

## 2022-11-20 ENCOUNTER — Ambulatory Visit: Payer: Medicare HMO | Admitting: Family Medicine

## 2022-11-20 VITALS — BP 97/66 | HR 107 | Temp 98.3°F | Ht 65.0 in | Wt 174.6 lb

## 2022-11-20 DIAGNOSIS — J449 Chronic obstructive pulmonary disease, unspecified: Secondary | ICD-10-CM | POA: Diagnosis not present

## 2022-11-20 DIAGNOSIS — Z20822 Contact with and (suspected) exposure to covid-19: Secondary | ICD-10-CM | POA: Diagnosis not present

## 2022-11-20 DIAGNOSIS — J189 Pneumonia, unspecified organism: Secondary | ICD-10-CM | POA: Diagnosis not present

## 2022-11-20 DIAGNOSIS — I509 Heart failure, unspecified: Secondary | ICD-10-CM

## 2022-11-20 DIAGNOSIS — R0600 Dyspnea, unspecified: Secondary | ICD-10-CM | POA: Diagnosis not present

## 2022-11-20 DIAGNOSIS — R059 Cough, unspecified: Secondary | ICD-10-CM | POA: Diagnosis not present

## 2022-11-20 DIAGNOSIS — R9389 Abnormal findings on diagnostic imaging of other specified body structures: Secondary | ICD-10-CM | POA: Diagnosis not present

## 2022-11-20 DIAGNOSIS — U071 COVID-19: Secondary | ICD-10-CM | POA: Diagnosis not present

## 2022-11-20 MED ORDER — BENZONATATE 200 MG PO CAPS: 200.0000 mg | ORAL_CAPSULE | Freq: Three times a day (TID) | ORAL | 0 refills | Status: DC | PRN

## 2022-11-20 MED ORDER — PSEUDOEPHEDRINE-GUAIFENESIN ER 120-1200 MG PO TB12: 1.0000 | ORAL_TABLET | Freq: Two times a day (BID) | ORAL | 2 refills | Status: DC

## 2022-11-20 NOTE — Progress Notes (Signed)
re  Subjective:  Patient ID: Amber Williams, female    DOB: 1975-08-06  Age: 47 y.o. MRN: [id number]  CC: Hospitalization Follow-up   HPI SAFARI CINQUE presents for recent hospitalization for pneumonia. Hydration efforts tipped her into CHF. Finished antibiotics. Started on carvedilol 3.125 every day, Lisinopril 2.5 every day and spironolactone 25 mg 1/2 every day. Hospitalized in [place], [place] from 7/7 to 11/10/22      11/20/2022    9:56 AM 10/12/2022    1:21 PM 06/22/2022    1:25 PM  Depression screen PHQ 2/9  Decreased Interest 1 1 0  Down, Depressed, Hopeless 0 2 1  PHQ - 2 Score 1 3 1   Altered sleeping 1 0 1  Tired, decreased energy 3 3 1   Change in appetite 0 2 0  Feeling bad or failure about yourself  0 0 0  Trouble concentrating 0 1 0  Moving slowly or fidgety/restless 0 0 0  Suicidal thoughts 0 0 0  PHQ-9 Score 5 9 3   Difficult doing work/chores Not difficult at all Very difficult Not difficult at all    History Conner has a past medical history of Anal cancer (HCC) (01/10/2019), Arthritis, Depression, DVT (deep venous thrombosis) (HCC) (2013), Essential hypertension, Family history of breast cancer, Gait abnormality (08/03/2016), GERD (gastroesophageal reflux disease) (08/03/2020), History of blood transfusion (2013), Hyperlipidemia, Hypothyroidism, Hypothyroidism, Multiple sclerosis (HCC), Neurogenic bladder (08/03/2016), Neuromuscular disorder (HCC) (1993), Port-A-Cath in place (01/23/2019), PTSD (post-traumatic stress disorder), PTSD (post-traumatic stress disorder), Right foot drop (08/03/2016), Seasonal allergies, and Ulcer.   She has a past surgical history that includes Colon surgery; Gastric bypass (2010); Interstim Implant placement (2014); Portacath placement; Hemorrhoid surgery (N/A, 12/25/2018); Flexible sigmoidoscopy (N/A, 06/25/2019); Colonoscopy with propofol (N/A, 03/04/2020); Colonoscopy (N/A, 07/06/2020); Interstim Implant placement (N/A, 12/27/2020);  Colonoscopy with propofol (N/A, 08/02/2021); Esophagogastroduodenoscopy (egd) with propofol (N/A, 08/17/2021); Esophageal dilation (N/A, 08/17/2021); biopsy (08/17/2021); and Interstim implant revision (N/A, 11/24/2021).   Her family history includes Alcohol abuse in her sister; Breast cancer (age of onset: 62) in her sister; Breast cancer (age of onset: 9) in her mother; Cancer in her mother, paternal grandmother, and sister; Diabetes in her father, maternal grandmother, and paternal grandmother; Heart disease in her maternal grandfather, maternal grandmother, and paternal grandfather; Hypertension in her father and sister; Miscarriages / Stillbirths in her sister; Stroke in her paternal grandfather.She reports that she quit smoking about 13 years ago. Her smoking use included cigarettes. She started smoking about 31 years ago. She has a 26.6 pack-year smoking history. She has never used smokeless tobacco. She reports that she does not currently use alcohol. She reports that she does not use drugs.    ROS Review of Systems  Constitutional: Negative.   HENT: Negative.    Eyes:  Negative for visual disturbance.  Respiratory:  Positive for cough. Negative for shortness of breath.   Cardiovascular:  Negative for chest pain.  Gastrointestinal:  Negative for abdominal pain.  Musculoskeletal:  Negative for arthralgias.    Objective:  BP 97/66   Pulse (!) 107   Temp 98.3 F (36.8 C)   Ht 5\' 5"  (1.651 m)   Wt 174 lb 9.6 oz (79.2 kg)   SpO2 100%   BMI 29.05 kg/m   BP Readings from Last 3 Encounters:  11/20/22 97/66  10/12/22 114/66  08/09/22 137/84    Wt Readings from Last 3 Encounters:  11/20/22 174 lb 9.6 oz (79.2 kg)  10/12/22 178 lb (80.7 kg)  06/22/22 188 lb  9.6 oz (85.5 kg)     Physical Exam Constitutional:      General: She is not in acute distress.    Appearance: She is well-developed.  Cardiovascular:     Rate and Rhythm: Normal rate and regular rhythm.  Pulmonary:      Breath sounds: Normal breath sounds.  Musculoskeletal:        General: Normal range of motion.  Skin:    General: Skin is warm and dry.  Neurological:     Mental Status: She is alert and oriented to person, place, and time.       Assessment & Plan:   Michaline was seen today for hospitalization follow-up.  Diagnoses and all orders for this visit:  Community acquired pneumonia, unspecified laterality -     CMP14+EGFR -     CBC with Differential/Platelet -     DG Chest 2 View; Future  Acute congestive heart failure, unspecified heart failure type (HCC) -     CMP14+EGFR -     CBC with Differential/Platelet -     Brain natriuretic peptide -     DG Chest 2 View; Future  Other orders -     benzonatate (TESSALON) 200 MG capsule; Take 1 capsule (200 mg total) by mouth 3 (three) times daily as needed for cough. -     Pseudoephedrine-Guaifenesin [phone] MG TB12; Take 1 tablet by mouth 2 (two) times daily. For congestion       I have discontinued Sherae D. Burrous's ofloxacin and predniSONE. I am also having her start on benzonatate and Pseudoephedrine-Guaifenesin. Additionally, I am having her maintain her Vitamin D3, Cyanocobalamin, multivitamin with minerals, Vitamin A, mirabegron ER, famotidine, oxymetazoline, apixaban, omeprazole, PARoxetine, QUEtiapine, levothyroxine, cyclobenzaprine, spironolactone, lisinopril, and carvedilol.  Allergies as of 11/20/2022       Reactions   Silicone Hives   hives   Tecfidera [dimethyl Fumarate] Itching        Medication List        Accurate as of November 20, 2022  5:28 PM. If you have any questions, ask your nurse or doctor.          STOP taking these medications    ofloxacin 0.3 % OTIC solution Commonly known as: FLOXIN Stopped by: Allana Shrestha   predniSONE 10 MG (21) Tbpk tablet Commonly known as: STERAPRED UNI-PAK 21 TAB Stopped by: Margie Brink       TAKE these medications    apixaban 5 MG Tabs tablet Commonly  known as: Eliquis Take 1 tablet (5 mg total) by mouth 2 (two) times daily.   benzonatate 200 MG capsule Commonly known as: TESSALON Take 1 capsule (200 mg total) by mouth 3 (three) times daily as needed for cough. Started by: Diannia Hogenson   carvedilol 3.125 MG tablet Commonly known as: COREG Take 3.125 mg by mouth 2 (two) times daily with a meal.   Cyanocobalamin 5000 MCG Tbdp Take 5,000 mcg by mouth daily.   cyclobenzaprine 10 MG tablet Commonly known as: FLEXERIL Take 1 tablet (10 mg total) by mouth 3 (three) times daily as needed for muscle spasms.   famotidine 40 MG tablet Commonly known as: PEPCID Take 1 tablet (40 mg total) by mouth at bedtime as needed for heartburn or indigestion.   levothyroxine 112 MCG tablet Commonly known as: SYNTHROID TAKE 1 TABLET BY MOUTH DAILY BEFORE breakfast   lisinopril 2.5 MG tablet Commonly known as: ZESTRIL Take 2.5 mg by mouth daily.   mirabegron ER 50 MG Tb24  tablet Commonly known as: MYRBETRIQ Take 50 mg by mouth daily.   multivitamin with minerals tablet Take 1 tablet by mouth daily. One a day woman   omeprazole 40 MG capsule Commonly known as: PRILOSEC Take 1 capsule (40 mg total) by mouth daily.   oxymetazoline 0.05 % nasal spray Commonly known as: AFRIN Place 1 spray into both nostrils 2 (two) times daily as needed for congestion.   PARoxetine 20 MG tablet Commonly known as: PAXIL TAKE 1 TABLET BY MOUTH DAILY   Pseudoephedrine-Guaifenesin [phone] MG Tb12 Take 1 tablet by mouth 2 (two) times daily. For congestion Started by: Beulah Matusek   QUEtiapine 50 MG tablet Commonly known as: SEROQUEL TAKE 1 TABLET BY MOUTH AT BEDTIME   spironolactone 25 MG tablet Commonly known as: ALDACTONE Take 12.5 mg by mouth daily.   Vitamin A 2400 MCG (8000 UT) Caps Take 8,000 Units by mouth daily.   Vitamin D3 125 MCG (5000 UT) Caps Take 5,000 Units by mouth every morning.         Follow-up: Return in about 6 weeks  (around 01/01/2023), or if symptoms worsen or fail to improve.  Mechele Claude, M.D.

## 2022-11-22 ENCOUNTER — Encounter: Payer: Self-pay | Admitting: Family Medicine

## 2022-11-22 ENCOUNTER — Other Ambulatory Visit: Payer: Self-pay | Admitting: Family Medicine

## 2022-11-22 DIAGNOSIS — M791 Myalgia, unspecified site: Secondary | ICD-10-CM

## 2022-11-22 MED ORDER — CYCLOBENZAPRINE HCL 10 MG PO TABS
10.0000 mg | ORAL_TABLET | Freq: Three times a day (TID) | ORAL | 5 refills | Status: DC | PRN
Start: 2022-11-22 — End: 2023-01-11

## 2022-11-23 ENCOUNTER — Inpatient Hospital Stay: Payer: Medicare HMO | Admitting: Family Medicine

## 2022-11-24 ENCOUNTER — Encounter: Payer: Self-pay | Admitting: Urology

## 2022-11-26 ENCOUNTER — Other Ambulatory Visit: Payer: Self-pay

## 2022-11-26 MED ORDER — MIRABEGRON ER 50 MG PO TB24: 50.0000 mg | ORAL_TABLET | Freq: Every day | ORAL | 11 refills | Status: DC

## 2022-12-01 DIAGNOSIS — R051 Acute cough: Secondary | ICD-10-CM | POA: Diagnosis not present

## 2022-12-01 DIAGNOSIS — R058 Other specified cough: Secondary | ICD-10-CM | POA: Diagnosis not present

## 2022-12-01 DIAGNOSIS — Z20822 Contact with and (suspected) exposure to covid-19: Secondary | ICD-10-CM | POA: Diagnosis not present

## 2022-12-03 DIAGNOSIS — Z5941 Food insecurity: Secondary | ICD-10-CM | POA: Diagnosis not present

## 2022-12-03 DIAGNOSIS — Z7982 Long term (current) use of aspirin: Secondary | ICD-10-CM | POA: Diagnosis not present

## 2022-12-03 DIAGNOSIS — R079 Chest pain, unspecified: Secondary | ICD-10-CM | POA: Diagnosis not present

## 2022-12-03 DIAGNOSIS — Z87891 Personal history of nicotine dependence: Secondary | ICD-10-CM | POA: Diagnosis not present

## 2022-12-03 DIAGNOSIS — Z452 Encounter for adjustment and management of vascular access device: Secondary | ICD-10-CM | POA: Diagnosis not present

## 2022-12-03 DIAGNOSIS — N3001 Acute cystitis with hematuria: Secondary | ICD-10-CM | POA: Diagnosis not present

## 2022-12-03 DIAGNOSIS — R0602 Shortness of breath: Secondary | ICD-10-CM | POA: Diagnosis not present

## 2022-12-03 DIAGNOSIS — U071 COVID-19: Secondary | ICD-10-CM | POA: Diagnosis not present

## 2022-12-07 ENCOUNTER — Other Ambulatory Visit: Payer: Self-pay | Admitting: Family Medicine

## 2022-12-09 DIAGNOSIS — C210 Malignant neoplasm of anus, unspecified: Secondary | ICD-10-CM | POA: Diagnosis not present

## 2022-12-09 DIAGNOSIS — R0603 Acute respiratory distress: Secondary | ICD-10-CM | POA: Diagnosis not present

## 2022-12-09 DIAGNOSIS — Z86718 Personal history of other venous thrombosis and embolism: Secondary | ICD-10-CM | POA: Diagnosis not present

## 2022-12-09 DIAGNOSIS — Z5941 Food insecurity: Secondary | ICD-10-CM | POA: Diagnosis not present

## 2022-12-09 DIAGNOSIS — K219 Gastro-esophageal reflux disease without esophagitis: Secondary | ICD-10-CM | POA: Diagnosis not present

## 2022-12-09 DIAGNOSIS — U071 COVID-19: Secondary | ICD-10-CM | POA: Diagnosis not present

## 2022-12-09 DIAGNOSIS — Z79899 Other long term (current) drug therapy: Secondary | ICD-10-CM | POA: Diagnosis not present

## 2022-12-09 DIAGNOSIS — Z85038 Personal history of other malignant neoplasm of large intestine: Secondary | ICD-10-CM | POA: Diagnosis not present

## 2022-12-09 DIAGNOSIS — E039 Hypothyroidism, unspecified: Secondary | ICD-10-CM | POA: Diagnosis not present

## 2022-12-09 DIAGNOSIS — Z9981 Dependence on supplemental oxygen: Secondary | ICD-10-CM | POA: Diagnosis not present

## 2022-12-09 DIAGNOSIS — J90 Pleural effusion, not elsewhere classified: Secondary | ICD-10-CM | POA: Diagnosis not present

## 2022-12-09 DIAGNOSIS — J9620 Acute and chronic respiratory failure, unspecified whether with hypoxia or hypercapnia: Secondary | ICD-10-CM | POA: Diagnosis not present

## 2022-12-09 DIAGNOSIS — Z7901 Long term (current) use of anticoagulants: Secondary | ICD-10-CM | POA: Diagnosis not present

## 2022-12-09 DIAGNOSIS — Z792 Long term (current) use of antibiotics: Secondary | ICD-10-CM | POA: Diagnosis not present

## 2022-12-09 DIAGNOSIS — R000 Tachycardia, unspecified: Secondary | ICD-10-CM | POA: Diagnosis not present

## 2022-12-09 DIAGNOSIS — Z7952 Long term (current) use of systemic steroids: Secondary | ICD-10-CM | POA: Diagnosis not present

## 2022-12-09 DIAGNOSIS — R7989 Other specified abnormal findings of blood chemistry: Secondary | ICD-10-CM | POA: Diagnosis not present

## 2022-12-09 DIAGNOSIS — M3500 Sicca syndrome, unspecified: Secondary | ICD-10-CM | POA: Diagnosis not present

## 2022-12-09 DIAGNOSIS — Z87891 Personal history of nicotine dependence: Secondary | ICD-10-CM | POA: Diagnosis not present

## 2022-12-09 DIAGNOSIS — J189 Pneumonia, unspecified organism: Secondary | ICD-10-CM | POA: Diagnosis not present

## 2022-12-09 DIAGNOSIS — E8770 Fluid overload, unspecified: Secondary | ICD-10-CM | POA: Diagnosis not present

## 2022-12-09 DIAGNOSIS — Z79890 Hormone replacement therapy (postmenopausal): Secondary | ICD-10-CM | POA: Diagnosis not present

## 2022-12-09 DIAGNOSIS — Z9989 Dependence on other enabling machines and devices: Secondary | ICD-10-CM | POA: Diagnosis not present

## 2022-12-09 DIAGNOSIS — F419 Anxiety disorder, unspecified: Secondary | ICD-10-CM | POA: Diagnosis not present

## 2022-12-09 DIAGNOSIS — J1282 Pneumonia due to coronavirus disease 2019: Secondary | ICD-10-CM | POA: Diagnosis not present

## 2022-12-09 DIAGNOSIS — J9601 Acute respiratory failure with hypoxia: Secondary | ICD-10-CM | POA: Diagnosis not present

## 2022-12-09 DIAGNOSIS — R0902 Hypoxemia: Secondary | ICD-10-CM | POA: Diagnosis not present

## 2022-12-09 DIAGNOSIS — G35 Multiple sclerosis: Secondary | ICD-10-CM | POA: Diagnosis not present

## 2022-12-09 DIAGNOSIS — J984 Other disorders of lung: Secondary | ICD-10-CM | POA: Diagnosis not present

## 2022-12-09 DIAGNOSIS — R042 Hemoptysis: Secondary | ICD-10-CM | POA: Diagnosis not present

## 2022-12-09 DIAGNOSIS — Z85048 Personal history of other malignant neoplasm of rectum, rectosigmoid junction, and anus: Secondary | ICD-10-CM | POA: Diagnosis not present

## 2022-12-09 DIAGNOSIS — Z7951 Long term (current) use of inhaled steroids: Secondary | ICD-10-CM | POA: Diagnosis not present

## 2022-12-09 DIAGNOSIS — N319 Neuromuscular dysfunction of bladder, unspecified: Secondary | ICD-10-CM | POA: Diagnosis not present

## 2022-12-09 DIAGNOSIS — R918 Other nonspecific abnormal finding of lung field: Secondary | ICD-10-CM | POA: Diagnosis not present

## 2022-12-09 DIAGNOSIS — R059 Cough, unspecified: Secondary | ICD-10-CM | POA: Diagnosis not present

## 2022-12-09 DIAGNOSIS — R0602 Shortness of breath: Secondary | ICD-10-CM | POA: Diagnosis not present

## 2022-12-09 DIAGNOSIS — J9621 Acute and chronic respiratory failure with hypoxia: Secondary | ICD-10-CM | POA: Diagnosis not present

## 2022-12-09 DIAGNOSIS — R0989 Other specified symptoms and signs involving the circulatory and respiratory systems: Secondary | ICD-10-CM | POA: Diagnosis not present

## 2022-12-09 DIAGNOSIS — Z8616 Personal history of COVID-19: Secondary | ICD-10-CM | POA: Diagnosis not present

## 2022-12-10 DIAGNOSIS — J189 Pneumonia, unspecified organism: Secondary | ICD-10-CM | POA: Diagnosis not present

## 2022-12-10 DIAGNOSIS — K219 Gastro-esophageal reflux disease without esophagitis: Secondary | ICD-10-CM | POA: Diagnosis not present

## 2022-12-10 DIAGNOSIS — R0902 Hypoxemia: Secondary | ICD-10-CM | POA: Diagnosis not present

## 2022-12-10 DIAGNOSIS — G35 Multiple sclerosis: Secondary | ICD-10-CM | POA: Diagnosis not present

## 2022-12-10 DIAGNOSIS — Z85048 Personal history of other malignant neoplasm of rectum, rectosigmoid junction, and anus: Secondary | ICD-10-CM | POA: Diagnosis not present

## 2022-12-10 DIAGNOSIS — Z792 Long term (current) use of antibiotics: Secondary | ICD-10-CM | POA: Diagnosis not present

## 2022-12-11 DIAGNOSIS — Z8616 Personal history of COVID-19: Secondary | ICD-10-CM | POA: Diagnosis not present

## 2022-12-11 DIAGNOSIS — J189 Pneumonia, unspecified organism: Secondary | ICD-10-CM | POA: Diagnosis not present

## 2022-12-11 DIAGNOSIS — Z9981 Dependence on supplemental oxygen: Secondary | ICD-10-CM | POA: Diagnosis not present

## 2022-12-11 DIAGNOSIS — Z85048 Personal history of other malignant neoplasm of rectum, rectosigmoid junction, and anus: Secondary | ICD-10-CM | POA: Diagnosis not present

## 2022-12-11 DIAGNOSIS — Z792 Long term (current) use of antibiotics: Secondary | ICD-10-CM | POA: Diagnosis not present

## 2022-12-11 DIAGNOSIS — R0902 Hypoxemia: Secondary | ICD-10-CM | POA: Diagnosis not present

## 2022-12-11 DIAGNOSIS — G35 Multiple sclerosis: Secondary | ICD-10-CM | POA: Diagnosis not present

## 2022-12-11 DIAGNOSIS — K219 Gastro-esophageal reflux disease without esophagitis: Secondary | ICD-10-CM | POA: Diagnosis not present

## 2022-12-11 DIAGNOSIS — Z7951 Long term (current) use of inhaled steroids: Secondary | ICD-10-CM | POA: Diagnosis not present

## 2022-12-12 DIAGNOSIS — Z7952 Long term (current) use of systemic steroids: Secondary | ICD-10-CM | POA: Diagnosis not present

## 2022-12-12 DIAGNOSIS — J189 Pneumonia, unspecified organism: Secondary | ICD-10-CM | POA: Diagnosis not present

## 2022-12-12 DIAGNOSIS — R000 Tachycardia, unspecified: Secondary | ICD-10-CM | POA: Diagnosis not present

## 2022-12-12 DIAGNOSIS — Z9981 Dependence on supplemental oxygen: Secondary | ICD-10-CM | POA: Diagnosis not present

## 2022-12-12 DIAGNOSIS — Z7901 Long term (current) use of anticoagulants: Secondary | ICD-10-CM | POA: Diagnosis not present

## 2022-12-12 DIAGNOSIS — G35 Multiple sclerosis: Secondary | ICD-10-CM | POA: Diagnosis not present

## 2022-12-13 DIAGNOSIS — Z7952 Long term (current) use of systemic steroids: Secondary | ICD-10-CM | POA: Diagnosis not present

## 2022-12-13 DIAGNOSIS — U071 COVID-19: Secondary | ICD-10-CM | POA: Diagnosis not present

## 2022-12-13 DIAGNOSIS — R0902 Hypoxemia: Secondary | ICD-10-CM | POA: Diagnosis not present

## 2022-12-13 DIAGNOSIS — J189 Pneumonia, unspecified organism: Secondary | ICD-10-CM | POA: Diagnosis not present

## 2022-12-13 DIAGNOSIS — Z79899 Other long term (current) drug therapy: Secondary | ICD-10-CM | POA: Diagnosis not present

## 2022-12-13 DIAGNOSIS — G35 Multiple sclerosis: Secondary | ICD-10-CM | POA: Diagnosis not present

## 2022-12-14 DIAGNOSIS — R918 Other nonspecific abnormal finding of lung field: Secondary | ICD-10-CM | POA: Diagnosis not present

## 2022-12-14 DIAGNOSIS — J189 Pneumonia, unspecified organism: Secondary | ICD-10-CM | POA: Diagnosis not present

## 2022-12-14 DIAGNOSIS — R059 Cough, unspecified: Secondary | ICD-10-CM | POA: Diagnosis not present

## 2022-12-14 DIAGNOSIS — J90 Pleural effusion, not elsewhere classified: Secondary | ICD-10-CM | POA: Diagnosis not present

## 2022-12-14 DIAGNOSIS — R0602 Shortness of breath: Secondary | ICD-10-CM | POA: Diagnosis not present

## 2022-12-14 DIAGNOSIS — U071 COVID-19: Secondary | ICD-10-CM | POA: Diagnosis not present

## 2022-12-14 DIAGNOSIS — G35 Multiple sclerosis: Secondary | ICD-10-CM | POA: Diagnosis not present

## 2022-12-14 DIAGNOSIS — J1282 Pneumonia due to coronavirus disease 2019: Secondary | ICD-10-CM | POA: Diagnosis not present

## 2022-12-14 DIAGNOSIS — Z87891 Personal history of nicotine dependence: Secondary | ICD-10-CM | POA: Diagnosis not present

## 2022-12-14 DIAGNOSIS — C210 Malignant neoplasm of anus, unspecified: Secondary | ICD-10-CM | POA: Diagnosis not present

## 2022-12-14 DIAGNOSIS — R0902 Hypoxemia: Secondary | ICD-10-CM | POA: Diagnosis not present

## 2022-12-14 DIAGNOSIS — N319 Neuromuscular dysfunction of bladder, unspecified: Secondary | ICD-10-CM | POA: Diagnosis not present

## 2022-12-14 DIAGNOSIS — K219 Gastro-esophageal reflux disease without esophagitis: Secondary | ICD-10-CM | POA: Diagnosis not present

## 2022-12-15 DIAGNOSIS — J189 Pneumonia, unspecified organism: Secondary | ICD-10-CM | POA: Diagnosis not present

## 2022-12-15 DIAGNOSIS — R0603 Acute respiratory distress: Secondary | ICD-10-CM | POA: Diagnosis not present

## 2022-12-15 DIAGNOSIS — R042 Hemoptysis: Secondary | ICD-10-CM | POA: Diagnosis not present

## 2022-12-15 DIAGNOSIS — G35 Multiple sclerosis: Secondary | ICD-10-CM | POA: Diagnosis not present

## 2022-12-15 DIAGNOSIS — R0902 Hypoxemia: Secondary | ICD-10-CM | POA: Diagnosis not present

## 2022-12-15 DIAGNOSIS — R059 Cough, unspecified: Secondary | ICD-10-CM | POA: Diagnosis not present

## 2022-12-16 DIAGNOSIS — G35 Multiple sclerosis: Secondary | ICD-10-CM | POA: Diagnosis not present

## 2022-12-16 DIAGNOSIS — R918 Other nonspecific abnormal finding of lung field: Secondary | ICD-10-CM | POA: Diagnosis not present

## 2022-12-16 DIAGNOSIS — R059 Cough, unspecified: Secondary | ICD-10-CM | POA: Diagnosis not present

## 2022-12-16 DIAGNOSIS — R000 Tachycardia, unspecified: Secondary | ICD-10-CM | POA: Diagnosis not present

## 2022-12-16 DIAGNOSIS — J9601 Acute respiratory failure with hypoxia: Secondary | ICD-10-CM | POA: Diagnosis not present

## 2022-12-16 DIAGNOSIS — J189 Pneumonia, unspecified organism: Secondary | ICD-10-CM | POA: Diagnosis not present

## 2022-12-16 DIAGNOSIS — R042 Hemoptysis: Secondary | ICD-10-CM | POA: Diagnosis not present

## 2022-12-16 DIAGNOSIS — R0902 Hypoxemia: Secondary | ICD-10-CM | POA: Diagnosis not present

## 2022-12-17 DIAGNOSIS — Z79899 Other long term (current) drug therapy: Secondary | ICD-10-CM | POA: Diagnosis not present

## 2022-12-17 DIAGNOSIS — R7989 Other specified abnormal findings of blood chemistry: Secondary | ICD-10-CM | POA: Diagnosis not present

## 2022-12-17 DIAGNOSIS — R000 Tachycardia, unspecified: Secondary | ICD-10-CM | POA: Diagnosis not present

## 2022-12-17 DIAGNOSIS — G35 Multiple sclerosis: Secondary | ICD-10-CM | POA: Diagnosis not present

## 2022-12-17 DIAGNOSIS — R042 Hemoptysis: Secondary | ICD-10-CM | POA: Diagnosis not present

## 2022-12-17 DIAGNOSIS — Z79890 Hormone replacement therapy (postmenopausal): Secondary | ICD-10-CM | POA: Diagnosis not present

## 2022-12-17 DIAGNOSIS — R0603 Acute respiratory distress: Secondary | ICD-10-CM | POA: Diagnosis not present

## 2022-12-17 DIAGNOSIS — Z9989 Dependence on other enabling machines and devices: Secondary | ICD-10-CM | POA: Diagnosis not present

## 2022-12-17 DIAGNOSIS — Z86718 Personal history of other venous thrombosis and embolism: Secondary | ICD-10-CM | POA: Diagnosis not present

## 2022-12-17 DIAGNOSIS — F419 Anxiety disorder, unspecified: Secondary | ICD-10-CM | POA: Diagnosis not present

## 2022-12-17 DIAGNOSIS — J189 Pneumonia, unspecified organism: Secondary | ICD-10-CM | POA: Diagnosis not present

## 2022-12-18 DIAGNOSIS — R918 Other nonspecific abnormal finding of lung field: Secondary | ICD-10-CM | POA: Diagnosis not present

## 2022-12-18 DIAGNOSIS — E8770 Fluid overload, unspecified: Secondary | ICD-10-CM | POA: Diagnosis not present

## 2022-12-18 DIAGNOSIS — R0603 Acute respiratory distress: Secondary | ICD-10-CM | POA: Diagnosis not present

## 2022-12-18 DIAGNOSIS — R042 Hemoptysis: Secondary | ICD-10-CM | POA: Diagnosis not present

## 2022-12-18 DIAGNOSIS — R000 Tachycardia, unspecified: Secondary | ICD-10-CM | POA: Diagnosis not present

## 2022-12-18 DIAGNOSIS — R7989 Other specified abnormal findings of blood chemistry: Secondary | ICD-10-CM | POA: Diagnosis not present

## 2022-12-18 DIAGNOSIS — G35 Multiple sclerosis: Secondary | ICD-10-CM | POA: Diagnosis not present

## 2022-12-18 DIAGNOSIS — J984 Other disorders of lung: Secondary | ICD-10-CM | POA: Diagnosis not present

## 2022-12-18 DIAGNOSIS — R0602 Shortness of breath: Secondary | ICD-10-CM | POA: Diagnosis not present

## 2022-12-18 DIAGNOSIS — R059 Cough, unspecified: Secondary | ICD-10-CM | POA: Diagnosis not present

## 2022-12-18 DIAGNOSIS — R0902 Hypoxemia: Secondary | ICD-10-CM | POA: Diagnosis not present

## 2022-12-18 DIAGNOSIS — U071 COVID-19: Secondary | ICD-10-CM | POA: Diagnosis not present

## 2022-12-19 ENCOUNTER — Inpatient Hospital Stay: Payer: Medicare HMO | Admitting: Family Medicine

## 2022-12-19 DIAGNOSIS — J90 Pleural effusion, not elsewhere classified: Secondary | ICD-10-CM | POA: Diagnosis not present

## 2022-12-19 DIAGNOSIS — R918 Other nonspecific abnormal finding of lung field: Secondary | ICD-10-CM | POA: Diagnosis not present

## 2022-12-19 DIAGNOSIS — U071 COVID-19: Secondary | ICD-10-CM | POA: Diagnosis not present

## 2022-12-19 DIAGNOSIS — J1282 Pneumonia due to coronavirus disease 2019: Secondary | ICD-10-CM | POA: Diagnosis not present

## 2022-12-20 DIAGNOSIS — Z85038 Personal history of other malignant neoplasm of large intestine: Secondary | ICD-10-CM | POA: Diagnosis not present

## 2022-12-20 DIAGNOSIS — Z79899 Other long term (current) drug therapy: Secondary | ICD-10-CM | POA: Diagnosis not present

## 2022-12-20 DIAGNOSIS — Z7901 Long term (current) use of anticoagulants: Secondary | ICD-10-CM | POA: Diagnosis not present

## 2022-12-20 DIAGNOSIS — J9621 Acute and chronic respiratory failure with hypoxia: Secondary | ICD-10-CM | POA: Diagnosis not present

## 2022-12-20 DIAGNOSIS — R042 Hemoptysis: Secondary | ICD-10-CM | POA: Diagnosis not present

## 2022-12-20 DIAGNOSIS — J1282 Pneumonia due to coronavirus disease 2019: Secondary | ICD-10-CM | POA: Diagnosis not present

## 2022-12-20 DIAGNOSIS — R000 Tachycardia, unspecified: Secondary | ICD-10-CM | POA: Diagnosis not present

## 2022-12-20 DIAGNOSIS — K219 Gastro-esophageal reflux disease without esophagitis: Secondary | ICD-10-CM | POA: Diagnosis not present

## 2022-12-20 DIAGNOSIS — R0902 Hypoxemia: Secondary | ICD-10-CM | POA: Diagnosis not present

## 2022-12-20 DIAGNOSIS — U071 COVID-19: Secondary | ICD-10-CM | POA: Diagnosis not present

## 2022-12-20 DIAGNOSIS — G35 Multiple sclerosis: Secondary | ICD-10-CM | POA: Diagnosis not present

## 2022-12-21 ENCOUNTER — Ambulatory Visit: Payer: Medicare HMO | Admitting: Family Medicine

## 2022-12-21 DIAGNOSIS — J9621 Acute and chronic respiratory failure with hypoxia: Secondary | ICD-10-CM | POA: Diagnosis not present

## 2022-12-21 DIAGNOSIS — R0602 Shortness of breath: Secondary | ICD-10-CM | POA: Diagnosis not present

## 2022-12-21 DIAGNOSIS — J90 Pleural effusion, not elsewhere classified: Secondary | ICD-10-CM | POA: Diagnosis not present

## 2022-12-21 DIAGNOSIS — G35 Multiple sclerosis: Secondary | ICD-10-CM | POA: Diagnosis not present

## 2022-12-21 DIAGNOSIS — U071 COVID-19: Secondary | ICD-10-CM | POA: Diagnosis not present

## 2022-12-21 DIAGNOSIS — J1282 Pneumonia due to coronavirus disease 2019: Secondary | ICD-10-CM | POA: Diagnosis not present

## 2022-12-21 DIAGNOSIS — Z85048 Personal history of other malignant neoplasm of rectum, rectosigmoid junction, and anus: Secondary | ICD-10-CM | POA: Diagnosis not present

## 2022-12-21 DIAGNOSIS — R0902 Hypoxemia: Secondary | ICD-10-CM | POA: Diagnosis not present

## 2022-12-21 DIAGNOSIS — Z79899 Other long term (current) drug therapy: Secondary | ICD-10-CM | POA: Diagnosis not present

## 2022-12-21 DIAGNOSIS — R918 Other nonspecific abnormal finding of lung field: Secondary | ICD-10-CM | POA: Diagnosis not present

## 2022-12-21 DIAGNOSIS — R042 Hemoptysis: Secondary | ICD-10-CM | POA: Diagnosis not present

## 2022-12-22 DIAGNOSIS — J189 Pneumonia, unspecified organism: Secondary | ICD-10-CM | POA: Diagnosis not present

## 2022-12-22 DIAGNOSIS — U071 COVID-19: Secondary | ICD-10-CM | POA: Diagnosis not present

## 2022-12-22 DIAGNOSIS — J9620 Acute and chronic respiratory failure, unspecified whether with hypoxia or hypercapnia: Secondary | ICD-10-CM | POA: Diagnosis not present

## 2022-12-22 DIAGNOSIS — Z79899 Other long term (current) drug therapy: Secondary | ICD-10-CM | POA: Diagnosis not present

## 2022-12-22 DIAGNOSIS — M3500 Sicca syndrome, unspecified: Secondary | ICD-10-CM | POA: Diagnosis not present

## 2022-12-22 DIAGNOSIS — Z85048 Personal history of other malignant neoplasm of rectum, rectosigmoid junction, and anus: Secondary | ICD-10-CM | POA: Diagnosis not present

## 2022-12-22 DIAGNOSIS — Z792 Long term (current) use of antibiotics: Secondary | ICD-10-CM | POA: Diagnosis not present

## 2022-12-22 DIAGNOSIS — K219 Gastro-esophageal reflux disease without esophagitis: Secondary | ICD-10-CM | POA: Diagnosis not present

## 2022-12-22 DIAGNOSIS — J1282 Pneumonia due to coronavirus disease 2019: Secondary | ICD-10-CM | POA: Diagnosis not present

## 2022-12-23 DIAGNOSIS — K219 Gastro-esophageal reflux disease without esophagitis: Secondary | ICD-10-CM | POA: Diagnosis not present

## 2022-12-23 DIAGNOSIS — G35 Multiple sclerosis: Secondary | ICD-10-CM | POA: Diagnosis not present

## 2022-12-23 DIAGNOSIS — Z79899 Other long term (current) drug therapy: Secondary | ICD-10-CM | POA: Diagnosis not present

## 2022-12-23 DIAGNOSIS — Z792 Long term (current) use of antibiotics: Secondary | ICD-10-CM | POA: Diagnosis not present

## 2022-12-23 DIAGNOSIS — J9620 Acute and chronic respiratory failure, unspecified whether with hypoxia or hypercapnia: Secondary | ICD-10-CM | POA: Diagnosis not present

## 2022-12-23 DIAGNOSIS — Z85048 Personal history of other malignant neoplasm of rectum, rectosigmoid junction, and anus: Secondary | ICD-10-CM | POA: Diagnosis not present

## 2022-12-23 DIAGNOSIS — U071 COVID-19: Secondary | ICD-10-CM | POA: Diagnosis not present

## 2022-12-23 DIAGNOSIS — J189 Pneumonia, unspecified organism: Secondary | ICD-10-CM | POA: Diagnosis not present

## 2022-12-24 DIAGNOSIS — Z85048 Personal history of other malignant neoplasm of rectum, rectosigmoid junction, and anus: Secondary | ICD-10-CM | POA: Diagnosis not present

## 2022-12-24 DIAGNOSIS — U071 COVID-19: Secondary | ICD-10-CM | POA: Diagnosis not present

## 2022-12-24 DIAGNOSIS — E039 Hypothyroidism, unspecified: Secondary | ICD-10-CM | POA: Diagnosis not present

## 2022-12-24 DIAGNOSIS — J9621 Acute and chronic respiratory failure with hypoxia: Secondary | ICD-10-CM | POA: Diagnosis not present

## 2022-12-24 DIAGNOSIS — G35 Multiple sclerosis: Secondary | ICD-10-CM | POA: Diagnosis not present

## 2022-12-24 DIAGNOSIS — Z86718 Personal history of other venous thrombosis and embolism: Secondary | ICD-10-CM | POA: Diagnosis not present

## 2022-12-24 DIAGNOSIS — R042 Hemoptysis: Secondary | ICD-10-CM | POA: Diagnosis not present

## 2022-12-24 DIAGNOSIS — J1282 Pneumonia due to coronavirus disease 2019: Secondary | ICD-10-CM | POA: Diagnosis not present

## 2022-12-24 DIAGNOSIS — N319 Neuromuscular dysfunction of bladder, unspecified: Secondary | ICD-10-CM | POA: Diagnosis not present

## 2022-12-25 ENCOUNTER — Emergency Department (HOSPITAL_COMMUNITY): Payer: Medicare HMO

## 2022-12-25 ENCOUNTER — Inpatient Hospital Stay (HOSPITAL_COMMUNITY)
Admission: EM | Admit: 2022-12-25 | Discharge: 2023-01-30 | DRG: 871 | Disposition: E | Payer: Medicare HMO | Attending: Internal Medicine | Admitting: Internal Medicine

## 2022-12-25 ENCOUNTER — Other Ambulatory Visit: Payer: Self-pay

## 2022-12-25 DIAGNOSIS — Z7901 Long term (current) use of anticoagulants: Secondary | ICD-10-CM

## 2022-12-25 DIAGNOSIS — Z66 Do not resuscitate: Secondary | ICD-10-CM | POA: Diagnosis present

## 2022-12-25 DIAGNOSIS — Z6831 Body mass index (BMI) 31.0-31.9, adult: Secondary | ICD-10-CM | POA: Diagnosis not present

## 2022-12-25 DIAGNOSIS — R6521 Severe sepsis with septic shock: Secondary | ICD-10-CM | POA: Diagnosis present

## 2022-12-25 DIAGNOSIS — I10 Essential (primary) hypertension: Secondary | ICD-10-CM | POA: Diagnosis not present

## 2022-12-25 DIAGNOSIS — R140 Abdominal distension (gaseous): Secondary | ICD-10-CM | POA: Diagnosis not present

## 2022-12-25 DIAGNOSIS — Z79890 Hormone replacement therapy (postmenopausal): Secondary | ICD-10-CM

## 2022-12-25 DIAGNOSIS — D62 Acute posthemorrhagic anemia: Secondary | ICD-10-CM | POA: Diagnosis not present

## 2022-12-25 DIAGNOSIS — E039 Hypothyroidism, unspecified: Secondary | ICD-10-CM | POA: Diagnosis present

## 2022-12-25 DIAGNOSIS — R0902 Hypoxemia: Secondary | ICD-10-CM | POA: Diagnosis not present

## 2022-12-25 DIAGNOSIS — E1165 Type 2 diabetes mellitus with hyperglycemia: Secondary | ICD-10-CM | POA: Diagnosis present

## 2022-12-25 DIAGNOSIS — R040 Epistaxis: Secondary | ICD-10-CM | POA: Diagnosis not present

## 2022-12-25 DIAGNOSIS — G9341 Metabolic encephalopathy: Secondary | ICD-10-CM | POA: Diagnosis not present

## 2022-12-25 DIAGNOSIS — J849 Interstitial pulmonary disease, unspecified: Secondary | ICD-10-CM | POA: Diagnosis not present

## 2022-12-25 DIAGNOSIS — I82211 Chronic embolism and thrombosis of superior vena cava: Secondary | ICD-10-CM | POA: Diagnosis present

## 2022-12-25 DIAGNOSIS — J9601 Acute respiratory failure with hypoxia: Secondary | ICD-10-CM | POA: Diagnosis not present

## 2022-12-25 DIAGNOSIS — A419 Sepsis, unspecified organism: Secondary | ICD-10-CM | POA: Diagnosis present

## 2022-12-25 DIAGNOSIS — R069 Unspecified abnormalities of breathing: Secondary | ICD-10-CM | POA: Diagnosis not present

## 2022-12-25 DIAGNOSIS — G35 Multiple sclerosis: Secondary | ICD-10-CM | POA: Diagnosis present

## 2022-12-25 DIAGNOSIS — D6489 Other specified anemias: Secondary | ICD-10-CM | POA: Diagnosis not present

## 2022-12-25 DIAGNOSIS — L89152 Pressure ulcer of sacral region, stage 2: Secondary | ICD-10-CM | POA: Diagnosis not present

## 2022-12-25 DIAGNOSIS — L89150 Pressure ulcer of sacral region, unstageable: Secondary | ICD-10-CM | POA: Diagnosis present

## 2022-12-25 DIAGNOSIS — K3184 Gastroparesis: Secondary | ICD-10-CM | POA: Diagnosis present

## 2022-12-25 DIAGNOSIS — J9811 Atelectasis: Secondary | ICD-10-CM | POA: Diagnosis not present

## 2022-12-25 DIAGNOSIS — I4819 Other persistent atrial fibrillation: Secondary | ICD-10-CM | POA: Diagnosis present

## 2022-12-25 DIAGNOSIS — J9382 Other air leak: Secondary | ICD-10-CM | POA: Diagnosis not present

## 2022-12-25 DIAGNOSIS — N170 Acute kidney failure with tubular necrosis: Secondary | ICD-10-CM | POA: Diagnosis not present

## 2022-12-25 DIAGNOSIS — E440 Moderate protein-calorie malnutrition: Secondary | ICD-10-CM | POA: Diagnosis present

## 2022-12-25 DIAGNOSIS — J189 Pneumonia, unspecified organism: Secondary | ICD-10-CM | POA: Diagnosis present

## 2022-12-25 DIAGNOSIS — E8809 Other disorders of plasma-protein metabolism, not elsewhere classified: Secondary | ICD-10-CM | POA: Diagnosis not present

## 2022-12-25 DIAGNOSIS — Z781 Physical restraint status: Secondary | ICD-10-CM

## 2022-12-25 DIAGNOSIS — K922 Gastrointestinal hemorrhage, unspecified: Secondary | ICD-10-CM | POA: Diagnosis not present

## 2022-12-25 DIAGNOSIS — I110 Hypertensive heart disease with heart failure: Secondary | ICD-10-CM | POA: Diagnosis present

## 2022-12-25 DIAGNOSIS — J90 Pleural effusion, not elsewhere classified: Secondary | ICD-10-CM | POA: Diagnosis not present

## 2022-12-25 DIAGNOSIS — E46 Unspecified protein-calorie malnutrition: Secondary | ICD-10-CM | POA: Diagnosis not present

## 2022-12-25 DIAGNOSIS — J80 Acute respiratory distress syndrome: Secondary | ICD-10-CM | POA: Diagnosis present

## 2022-12-25 DIAGNOSIS — I82409 Acute embolism and thrombosis of unspecified deep veins of unspecified lower extremity: Secondary | ICD-10-CM | POA: Diagnosis present

## 2022-12-25 DIAGNOSIS — E871 Hypo-osmolality and hyponatremia: Secondary | ICD-10-CM | POA: Diagnosis present

## 2022-12-25 DIAGNOSIS — E876 Hypokalemia: Secondary | ICD-10-CM | POA: Diagnosis not present

## 2022-12-25 DIAGNOSIS — Z823 Family history of stroke: Secondary | ICD-10-CM

## 2022-12-25 DIAGNOSIS — I50814 Right heart failure due to left heart failure: Secondary | ICD-10-CM | POA: Diagnosis not present

## 2022-12-25 DIAGNOSIS — Z86718 Personal history of other venous thrombosis and embolism: Secondary | ICD-10-CM

## 2022-12-25 DIAGNOSIS — E874 Mixed disorder of acid-base balance: Secondary | ICD-10-CM | POA: Diagnosis not present

## 2022-12-25 DIAGNOSIS — R918 Other nonspecific abnormal finding of lung field: Secondary | ICD-10-CM | POA: Diagnosis not present

## 2022-12-25 DIAGNOSIS — T502X5A Adverse effect of carbonic-anhydrase inhibitors, benzothiadiazides and other diuretics, initial encounter: Secondary | ICD-10-CM | POA: Diagnosis not present

## 2022-12-25 DIAGNOSIS — Z9884 Bariatric surgery status: Secondary | ICD-10-CM | POA: Diagnosis not present

## 2022-12-25 DIAGNOSIS — Z833 Family history of diabetes mellitus: Secondary | ICD-10-CM

## 2022-12-25 DIAGNOSIS — Z4682 Encounter for fitting and adjustment of non-vascular catheter: Secondary | ICD-10-CM | POA: Diagnosis not present

## 2022-12-25 DIAGNOSIS — R9389 Abnormal findings on diagnostic imaging of other specified body structures: Secondary | ICD-10-CM | POA: Diagnosis not present

## 2022-12-25 DIAGNOSIS — J939 Pneumothorax, unspecified: Secondary | ICD-10-CM | POA: Diagnosis not present

## 2022-12-25 DIAGNOSIS — Z7952 Long term (current) use of systemic steroids: Secondary | ICD-10-CM

## 2022-12-25 DIAGNOSIS — F4310 Post-traumatic stress disorder, unspecified: Secondary | ICD-10-CM | POA: Diagnosis present

## 2022-12-25 DIAGNOSIS — I871 Compression of vein: Secondary | ICD-10-CM | POA: Diagnosis present

## 2022-12-25 DIAGNOSIS — Z9981 Dependence on supplemental oxygen: Secondary | ICD-10-CM

## 2022-12-25 DIAGNOSIS — R0602 Shortness of breath: Secondary | ICD-10-CM | POA: Diagnosis present

## 2022-12-25 DIAGNOSIS — I5023 Acute on chronic systolic (congestive) heart failure: Secondary | ICD-10-CM | POA: Diagnosis not present

## 2022-12-25 DIAGNOSIS — Z85048 Personal history of other malignant neoplasm of rectum, rectosigmoid junction, and anus: Secondary | ICD-10-CM

## 2022-12-25 DIAGNOSIS — Z95828 Presence of other vascular implants and grafts: Secondary | ICD-10-CM

## 2022-12-25 DIAGNOSIS — T45515A Adverse effect of anticoagulants, initial encounter: Secondary | ICD-10-CM | POA: Diagnosis not present

## 2022-12-25 DIAGNOSIS — Z515 Encounter for palliative care: Secondary | ICD-10-CM

## 2022-12-25 DIAGNOSIS — J181 Lobar pneumonia, unspecified organism: Secondary | ICD-10-CM

## 2022-12-25 DIAGNOSIS — U071 COVID-19: Secondary | ICD-10-CM | POA: Diagnosis not present

## 2022-12-25 DIAGNOSIS — U099 Post covid-19 condition, unspecified: Secondary | ICD-10-CM | POA: Diagnosis present

## 2022-12-25 DIAGNOSIS — Z5329 Procedure and treatment not carried out because of patient's decision for other reasons: Secondary | ICD-10-CM | POA: Diagnosis not present

## 2022-12-25 DIAGNOSIS — R0603 Acute respiratory distress: Secondary | ICD-10-CM | POA: Diagnosis not present

## 2022-12-25 DIAGNOSIS — D6959 Other secondary thrombocytopenia: Secondary | ICD-10-CM | POA: Diagnosis not present

## 2022-12-25 DIAGNOSIS — Z452 Encounter for adjustment and management of vascular access device: Secondary | ICD-10-CM | POA: Diagnosis not present

## 2022-12-25 DIAGNOSIS — Z79899 Other long term (current) drug therapy: Secondary | ICD-10-CM

## 2022-12-25 DIAGNOSIS — J168 Pneumonia due to other specified infectious organisms: Secondary | ICD-10-CM | POA: Diagnosis not present

## 2022-12-25 DIAGNOSIS — Z7189 Other specified counseling: Secondary | ICD-10-CM | POA: Diagnosis not present

## 2022-12-25 DIAGNOSIS — E041 Nontoxic single thyroid nodule: Secondary | ICD-10-CM | POA: Diagnosis not present

## 2022-12-25 DIAGNOSIS — F32A Depression, unspecified: Secondary | ICD-10-CM | POA: Diagnosis present

## 2022-12-25 DIAGNOSIS — Z803 Family history of malignant neoplasm of breast: Secondary | ICD-10-CM

## 2022-12-25 DIAGNOSIS — Z743 Need for continuous supervision: Secondary | ICD-10-CM | POA: Diagnosis not present

## 2022-12-25 DIAGNOSIS — E785 Hyperlipidemia, unspecified: Secondary | ICD-10-CM | POA: Diagnosis present

## 2022-12-25 DIAGNOSIS — R0989 Other specified symptoms and signs involving the circulatory and respiratory systems: Secondary | ICD-10-CM | POA: Diagnosis not present

## 2022-12-25 DIAGNOSIS — T380X5A Adverse effect of glucocorticoids and synthetic analogues, initial encounter: Secondary | ICD-10-CM | POA: Diagnosis not present

## 2022-12-25 DIAGNOSIS — Z820 Family history of epilepsy and other diseases of the nervous system: Secondary | ICD-10-CM

## 2022-12-25 DIAGNOSIS — G709 Myoneural disorder, unspecified: Secondary | ICD-10-CM | POA: Diagnosis present

## 2022-12-25 DIAGNOSIS — K219 Gastro-esophageal reflux disease without esophagitis: Secondary | ICD-10-CM | POA: Diagnosis present

## 2022-12-25 DIAGNOSIS — E0580 Other thyrotoxicosis without thyrotoxic crisis or storm: Secondary | ICD-10-CM | POA: Diagnosis present

## 2022-12-25 DIAGNOSIS — R6520 Severe sepsis without septic shock: Secondary | ICD-10-CM | POA: Diagnosis not present

## 2022-12-25 DIAGNOSIS — Z87440 Personal history of urinary (tract) infections: Secondary | ICD-10-CM

## 2022-12-25 DIAGNOSIS — M7989 Other specified soft tissue disorders: Secondary | ICD-10-CM | POA: Diagnosis not present

## 2022-12-25 DIAGNOSIS — H02402 Unspecified ptosis of left eyelid: Secondary | ICD-10-CM | POA: Diagnosis present

## 2022-12-25 DIAGNOSIS — Z87891 Personal history of nicotine dependence: Secondary | ICD-10-CM

## 2022-12-25 DIAGNOSIS — N319 Neuromuscular dysfunction of bladder, unspecified: Secondary | ICD-10-CM | POA: Diagnosis present

## 2022-12-25 DIAGNOSIS — J159 Unspecified bacterial pneumonia: Secondary | ICD-10-CM | POA: Diagnosis present

## 2022-12-25 DIAGNOSIS — J984 Other disorders of lung: Secondary | ICD-10-CM | POA: Diagnosis not present

## 2022-12-25 DIAGNOSIS — Z8249 Family history of ischemic heart disease and other diseases of the circulatory system: Secondary | ICD-10-CM

## 2022-12-25 DIAGNOSIS — F419 Anxiety disorder, unspecified: Secondary | ICD-10-CM | POA: Diagnosis present

## 2022-12-25 DIAGNOSIS — R0689 Other abnormalities of breathing: Secondary | ICD-10-CM | POA: Diagnosis not present

## 2022-12-25 DIAGNOSIS — Z888 Allergy status to other drugs, medicaments and biological substances status: Secondary | ICD-10-CM

## 2022-12-25 DIAGNOSIS — J9690 Respiratory failure, unspecified, unspecified whether with hypoxia or hypercapnia: Secondary | ICD-10-CM | POA: Diagnosis not present

## 2022-12-25 LAB — CBC WITH DIFFERENTIAL/PLATELET
Abs Immature Granulocytes: 0.32 10*3/uL — ABNORMAL HIGH (ref 0.00–0.07)
Basophils Absolute: 0 10*3/uL (ref 0.0–0.1)
Basophils Relative: 0 %
Eosinophils Absolute: 0 10*3/uL (ref 0.0–0.5)
Eosinophils Relative: 0 %
HCT: 34.8 % — ABNORMAL LOW (ref 36.0–46.0)
Hemoglobin: 10.5 g/dL — ABNORMAL LOW (ref 12.0–15.0)
Immature Granulocytes: 3 %
Lymphocytes Relative: 3 %
Lymphs Abs: 0.3 10*3/uL — ABNORMAL LOW (ref 0.7–4.0)
MCH: 26.6 pg (ref 26.0–34.0)
MCHC: 30.2 g/dL (ref 30.0–36.0)
MCV: 88.3 fL (ref 80.0–100.0)
Monocytes Absolute: 0.2 10*3/uL (ref 0.1–1.0)
Monocytes Relative: 2 %
Neutro Abs: 11.9 10*3/uL — ABNORMAL HIGH (ref 1.7–7.7)
Neutrophils Relative %: 92 %
Platelets: 287 10*3/uL (ref 150–400)
RBC: 3.94 MIL/uL (ref 3.87–5.11)
RDW: 17.2 % — ABNORMAL HIGH (ref 11.5–15.5)
WBC: 12.8 10*3/uL — ABNORMAL HIGH (ref 4.0–10.5)
nRBC: 0 % (ref 0.0–0.2)

## 2022-12-25 LAB — COMPREHENSIVE METABOLIC PANEL
ALT: 30 U/L (ref 0–44)
AST: 28 U/L (ref 15–41)
Albumin: 3 g/dL — ABNORMAL LOW (ref 3.5–5.0)
Alkaline Phosphatase: 85 U/L (ref 38–126)
Anion gap: 11 (ref 5–15)
BUN: 14 mg/dL (ref 6–20)
CO2: 23 mmol/L (ref 22–32)
Calcium: 9.3 mg/dL (ref 8.9–10.3)
Chloride: 96 mmol/L — ABNORMAL LOW (ref 98–111)
Creatinine, Ser: 0.98 mg/dL (ref 0.44–1.00)
GFR, Estimated: >60 mL/min (ref >60)
Glucose, Bld: 147 mg/dL — ABNORMAL HIGH (ref 70–99)
Potassium: 3.8 mmol/L (ref 3.5–5.1)
Sodium: 130 mmol/L — ABNORMAL LOW (ref 135–145)
Total Bilirubin: 0.8 mg/dL (ref 0.3–1.2)
Total Protein: 5.6 g/dL — ABNORMAL LOW (ref 6.5–8.1)

## 2022-12-25 LAB — APTT: aPTT: 37 seconds — ABNORMAL HIGH (ref 24–36)

## 2022-12-25 LAB — PROTIME-INR
INR: 1.7 — ABNORMAL HIGH (ref 0.8–1.2)
Prothrombin Time: 19.9 seconds — ABNORMAL HIGH (ref 11.4–15.2)

## 2022-12-25 LAB — LACTIC ACID, PLASMA: Lactic Acid, Venous: 1.7 mmol/L (ref 0.5–1.9)

## 2022-12-25 MED ORDER — LACTATED RINGERS IV BOLUS
1000.0000 mL | Freq: Once | INTRAVENOUS | Status: AC
  Administered 2022-12-25: 1000 mL via INTRAVENOUS

## 2022-12-25 MED ORDER — SODIUM CHLORIDE 0.9 % IV SOLN
2.0000 g | Freq: Once | INTRAVENOUS | Status: AC
  Administered 2022-12-25: 2 g via INTRAVENOUS
  Filled 2022-12-25: qty 12.5

## 2022-12-25 MED ORDER — ALBUMIN HUMAN 25 % IV SOLN
25.0000 g | Freq: Once | INTRAVENOUS | Status: AC
  Administered 2022-12-26: 25 g via INTRAVENOUS
  Filled 2022-12-25: qty 100

## 2022-12-25 MED ORDER — METHYLPREDNISOLONE SODIUM SUCC 125 MG IJ SOLR
125.0000 mg | Freq: Once | INTRAMUSCULAR | Status: AC
  Administered 2022-12-25: 125 mg via INTRAVENOUS
  Filled 2022-12-25: qty 2

## 2022-12-25 MED ORDER — IOHEXOL 350 MG/ML SOLN
100.0000 mL | Freq: Once | INTRAVENOUS | Status: AC | PRN
  Administered 2022-12-25: 100 mL via INTRAVENOUS

## 2022-12-25 MED ORDER — VANCOMYCIN HCL 1500 MG/300ML IV SOLN
1500.0000 mg | INTRAVENOUS | Status: DC
  Administered 2022-12-25 – 2022-12-27 (×3): 1500 mg via INTRAVENOUS
  Filled 2022-12-25 (×3): qty 300

## 2022-12-25 NOTE — ED Provider Notes (Signed)
11:34 PM Assumed care from Dr. Posey Rea, please see their note for full history, physical and decision making until this point. In brief this is a 47 y.o. year old female who presented to the ED tonight with Shortness of Breath     Worsening CAP. Worsening hypoxia. Also hypotensive. Pending fluids and bp stability for admit. Also with MS. No infection on sacral wound.   Discharge instructions, including strict return precautions for new or worsening symptoms, given. Patient and/or family verbalized understanding and agreement with the plan as described.   Labs, studies and imaging reviewed by myself and considered in medical decision making if ordered. Imaging interpreted by radiology.  Labs Reviewed  COMPREHENSIVE METABOLIC PANEL - Abnormal; Notable for the following components:      Result Value   Sodium 130 (*)    Chloride 96 (*)    Glucose, Bld 147 (*)    Total Protein 5.6 (*)    Albumin 3.0 (*)    All other components within normal limits  CBC WITH DIFFERENTIAL/PLATELET - Abnormal; Notable for the following components:   WBC 12.8 (*)    Hemoglobin 10.5 (*)    HCT 34.8 (*)    RDW 17.2 (*)    Neutro Abs 11.9 (*)    Lymphs Abs 0.3 (*)    Abs Immature Granulocytes 0.32 (*)    All other components within normal limits  PROTIME-INR - Abnormal; Notable for the following components:   Prothrombin Time 19.9 (*)    INR 1.7 (*)    All other components within normal limits  APTT - Abnormal; Notable for the following components:   aPTT 37 (*)    All other components within normal limits  CULTURE, BLOOD (ROUTINE X 2)  CULTURE, BLOOD (ROUTINE X 2)  LACTIC ACID, PLASMA  URINALYSIS, W/ REFLEX TO CULTURE (INFECTION SUSPECTED)  POC URINE PREG, ED    CT Angio Chest PE W and/or Wo Contrast  Final Result    DG Chest Port 1 View  Final Result      No follow-ups on file.

## 2022-12-25 NOTE — ED Notes (Signed)
ED Provider at bedside. 

## 2022-12-25 NOTE — ED Triage Notes (Signed)
Pt bib [facility] from home c/o SOB. Pt was released yesterday from [facility] with dx of hypoxia d/t pneumonia was sent home on 3L [place]. States Pt states she started feeling worse today and could not get her oxygen above 76% on 5L [place]. EMS placed pt on 15L [place], now at 93%  5 of albuterol PTA.

## 2022-12-25 NOTE — Progress Notes (Signed)
Pharmacy Antibiotic Note  Amber Williams is a 47 y.o. female admitted on 12/25/2022 with PNA. Pharmacy has been consulted for vancomycin dosing. WBC up, LA normal, Cr ~BL at 1mg /dl.   Plan: Vancomycin 1500mg  IV q24h - est AUC 463 Cefepime per MD  Height: 5\' 5"  (165.1 cm) Weight: 79.2 kg (174 lb 9.7 oz) IBW/kg (Calculated) : 57  Temp (24hrs), Avg:101.7 F (38.7 C), Min:101.7 F (38.7 C), Max:101.7 F (38.7 C)  Recent Labs  Lab 12/25/22 2054  WBC 12.8*    CrCl cannot be calculated (Patient's most recent lab result is older than the maximum 21 days allowed.).    Allergies  Allergen Reactions   Silicone Hives    hives   Tecfidera [Dimethyl Fumarate] Itching     Fredonia Highland, PharmD, BCPS, [facility] Clinical Pharmacist Please check AMION for all [facility] Pharmacy numbers 12/25/2022

## 2022-12-25 NOTE — ED Notes (Signed)
Patient transported to CT 

## 2022-12-26 DIAGNOSIS — A419 Sepsis, unspecified organism: Secondary | ICD-10-CM

## 2022-12-26 DIAGNOSIS — J984 Other disorders of lung: Secondary | ICD-10-CM | POA: Diagnosis not present

## 2022-12-26 DIAGNOSIS — E8809 Other disorders of plasma-protein metabolism, not elsewhere classified: Secondary | ICD-10-CM

## 2022-12-26 DIAGNOSIS — Z7189 Other specified counseling: Secondary | ICD-10-CM | POA: Diagnosis not present

## 2022-12-26 DIAGNOSIS — Z4682 Encounter for fitting and adjustment of non-vascular catheter: Secondary | ICD-10-CM | POA: Diagnosis not present

## 2022-12-26 DIAGNOSIS — R918 Other nonspecific abnormal finding of lung field: Secondary | ICD-10-CM | POA: Diagnosis not present

## 2022-12-26 DIAGNOSIS — R6521 Severe sepsis with septic shock: Secondary | ICD-10-CM | POA: Diagnosis present

## 2022-12-26 DIAGNOSIS — J9601 Acute respiratory failure with hypoxia: Secondary | ICD-10-CM | POA: Diagnosis not present

## 2022-12-26 DIAGNOSIS — Z6831 Body mass index (BMI) 31.0-31.9, adult: Secondary | ICD-10-CM | POA: Diagnosis not present

## 2022-12-26 DIAGNOSIS — J189 Pneumonia, unspecified organism: Secondary | ICD-10-CM

## 2022-12-26 DIAGNOSIS — L89152 Pressure ulcer of sacral region, stage 2: Secondary | ICD-10-CM

## 2022-12-26 DIAGNOSIS — Z515 Encounter for palliative care: Secondary | ICD-10-CM | POA: Diagnosis not present

## 2022-12-26 DIAGNOSIS — J181 Lobar pneumonia, unspecified organism: Secondary | ICD-10-CM | POA: Diagnosis not present

## 2022-12-26 DIAGNOSIS — E0580 Other thyrotoxicosis without thyrotoxic crisis or storm: Secondary | ICD-10-CM | POA: Diagnosis present

## 2022-12-26 DIAGNOSIS — J849 Interstitial pulmonary disease, unspecified: Secondary | ICD-10-CM | POA: Diagnosis not present

## 2022-12-26 DIAGNOSIS — Z9884 Bariatric surgery status: Secondary | ICD-10-CM | POA: Diagnosis not present

## 2022-12-26 DIAGNOSIS — R9389 Abnormal findings on diagnostic imaging of other specified body structures: Secondary | ICD-10-CM | POA: Diagnosis not present

## 2022-12-26 DIAGNOSIS — I10 Essential (primary) hypertension: Secondary | ICD-10-CM | POA: Insufficient documentation

## 2022-12-26 DIAGNOSIS — E874 Mixed disorder of acid-base balance: Secondary | ICD-10-CM | POA: Diagnosis not present

## 2022-12-26 DIAGNOSIS — J939 Pneumothorax, unspecified: Secondary | ICD-10-CM | POA: Diagnosis not present

## 2022-12-26 DIAGNOSIS — Z95828 Presence of other vascular implants and grafts: Secondary | ICD-10-CM | POA: Diagnosis not present

## 2022-12-26 DIAGNOSIS — I871 Compression of vein: Secondary | ICD-10-CM | POA: Diagnosis present

## 2022-12-26 DIAGNOSIS — R140 Abdominal distension (gaseous): Secondary | ICD-10-CM | POA: Diagnosis not present

## 2022-12-26 DIAGNOSIS — Z452 Encounter for adjustment and management of vascular access device: Secondary | ICD-10-CM | POA: Diagnosis not present

## 2022-12-26 DIAGNOSIS — R0603 Acute respiratory distress: Secondary | ICD-10-CM | POA: Diagnosis not present

## 2022-12-26 DIAGNOSIS — E871 Hypo-osmolality and hyponatremia: Secondary | ICD-10-CM | POA: Diagnosis not present

## 2022-12-26 DIAGNOSIS — Z66 Do not resuscitate: Secondary | ICD-10-CM | POA: Diagnosis present

## 2022-12-26 DIAGNOSIS — E46 Unspecified protein-calorie malnutrition: Secondary | ICD-10-CM

## 2022-12-26 DIAGNOSIS — F32A Depression, unspecified: Secondary | ICD-10-CM | POA: Diagnosis present

## 2022-12-26 DIAGNOSIS — L89150 Pressure ulcer of sacral region, unstageable: Secondary | ICD-10-CM | POA: Diagnosis present

## 2022-12-26 DIAGNOSIS — I110 Hypertensive heart disease with heart failure: Secondary | ICD-10-CM | POA: Diagnosis present

## 2022-12-26 DIAGNOSIS — E039 Hypothyroidism, unspecified: Secondary | ICD-10-CM | POA: Diagnosis present

## 2022-12-26 DIAGNOSIS — E440 Moderate protein-calorie malnutrition: Secondary | ICD-10-CM | POA: Diagnosis present

## 2022-12-26 DIAGNOSIS — E1165 Type 2 diabetes mellitus with hyperglycemia: Secondary | ICD-10-CM | POA: Diagnosis present

## 2022-12-26 DIAGNOSIS — G9341 Metabolic encephalopathy: Secondary | ICD-10-CM | POA: Diagnosis not present

## 2022-12-26 DIAGNOSIS — M7989 Other specified soft tissue disorders: Secondary | ICD-10-CM | POA: Diagnosis not present

## 2022-12-26 DIAGNOSIS — K219 Gastro-esophageal reflux disease without esophagitis: Secondary | ICD-10-CM

## 2022-12-26 DIAGNOSIS — I4819 Other persistent atrial fibrillation: Secondary | ICD-10-CM | POA: Diagnosis present

## 2022-12-26 DIAGNOSIS — G35 Multiple sclerosis: Secondary | ICD-10-CM | POA: Diagnosis present

## 2022-12-26 DIAGNOSIS — I82409 Acute embolism and thrombosis of unspecified deep veins of unspecified lower extremity: Secondary | ICD-10-CM

## 2022-12-26 DIAGNOSIS — J80 Acute respiratory distress syndrome: Secondary | ICD-10-CM | POA: Diagnosis present

## 2022-12-26 DIAGNOSIS — I82211 Chronic embolism and thrombosis of superior vena cava: Secondary | ICD-10-CM | POA: Diagnosis present

## 2022-12-26 DIAGNOSIS — I5023 Acute on chronic systolic (congestive) heart failure: Secondary | ICD-10-CM | POA: Diagnosis not present

## 2022-12-26 DIAGNOSIS — R0602 Shortness of breath: Secondary | ICD-10-CM | POA: Diagnosis present

## 2022-12-26 DIAGNOSIS — R0989 Other specified symptoms and signs involving the circulatory and respiratory systems: Secondary | ICD-10-CM | POA: Diagnosis not present

## 2022-12-26 DIAGNOSIS — R0902 Hypoxemia: Secondary | ICD-10-CM | POA: Diagnosis not present

## 2022-12-26 DIAGNOSIS — J9690 Respiratory failure, unspecified, unspecified whether with hypoxia or hypercapnia: Secondary | ICD-10-CM | POA: Diagnosis not present

## 2022-12-26 DIAGNOSIS — J159 Unspecified bacterial pneumonia: Secondary | ICD-10-CM | POA: Diagnosis present

## 2022-12-26 DIAGNOSIS — N170 Acute kidney failure with tubular necrosis: Secondary | ICD-10-CM | POA: Diagnosis not present

## 2022-12-26 DIAGNOSIS — R069 Unspecified abnormalities of breathing: Secondary | ICD-10-CM | POA: Diagnosis not present

## 2022-12-26 LAB — SODIUM, URINE, RANDOM: Sodium, Ur: 47 mmol/L

## 2022-12-26 LAB — COMPREHENSIVE METABOLIC PANEL
ALT: 25 U/L (ref 0–44)
AST: 22 U/L (ref 15–41)
Albumin: 3.1 g/dL — ABNORMAL LOW (ref 3.5–5.0)
Alkaline Phosphatase: 76 U/L (ref 38–126)
Anion gap: 8 (ref 5–15)
BUN: 12 mg/dL (ref 6–20)
CO2: 25 mmol/L (ref 22–32)
Calcium: 9.8 mg/dL (ref 8.9–10.3)
Chloride: 100 mmol/L (ref 98–111)
Creatinine, Ser: 0.63 mg/dL (ref 0.44–1.00)
GFR, Estimated: >60 mL/min (ref >60)
Glucose, Bld: 140 mg/dL — ABNORMAL HIGH (ref 70–99)
Potassium: 3.8 mmol/L (ref 3.5–5.1)
Sodium: 133 mmol/L — ABNORMAL LOW (ref 135–145)
Total Bilirubin: 0.7 mg/dL (ref 0.3–1.2)
Total Protein: 5.2 g/dL — ABNORMAL LOW (ref 6.5–8.1)

## 2022-12-26 LAB — PROCALCITONIN: Procalcitonin: <0.1 ng/mL

## 2022-12-26 LAB — OSMOLALITY, URINE: Osmolality, Ur: 295 mOsm/kg — ABNORMAL LOW (ref 300–900)

## 2022-12-26 LAB — URINALYSIS, W/ REFLEX TO CULTURE (INFECTION SUSPECTED)
Bilirubin Urine: NEGATIVE
Glucose, UA: NEGATIVE mg/dL
Ketones, ur: NEGATIVE mg/dL
Nitrite: NEGATIVE
Protein, ur: 100 mg/dL — AB
Specific Gravity, Urine: 1.02 (ref 1.005–1.030)
Trans Epithel, UA: 1
pH: 7 (ref 5.0–8.0)

## 2022-12-26 LAB — MAGNESIUM: Magnesium: 2.2 mg/dL (ref 1.7–2.4)

## 2022-12-26 LAB — MRSA NEXT GEN BY PCR, NASAL: MRSA by PCR Next Gen: NOT DETECTED

## 2022-12-26 LAB — PHOSPHORUS: Phosphorus: 3.4 mg/dL (ref 2.5–4.6)

## 2022-12-26 LAB — OSMOLALITY: Osmolality: 289 mOsm/kg (ref 275–295)

## 2022-12-26 LAB — STREP PNEUMONIAE URINARY ANTIGEN: Strep Pneumo Urinary Antigen: NEGATIVE

## 2022-12-26 LAB — HIV ANTIBODY (ROUTINE TESTING W REFLEX): HIV Screen 4th Generation wRfx: NONREACTIVE

## 2022-12-26 MED ORDER — ACETAMINOPHEN 325 MG PO TABS
650.0000 mg | ORAL_TABLET | Freq: Four times a day (QID) | ORAL | Status: DC | PRN
  Administered 2022-12-26: 650 mg via ORAL
  Filled 2022-12-26: qty 2

## 2022-12-26 MED ORDER — CHLORHEXIDINE GLUCONATE CLOTH 2 % EX PADS
6.0000 | MEDICATED_PAD | Freq: Every day | CUTANEOUS | Status: DC
  Administered 2022-12-26 – 2023-01-10 (×16): 6 via TOPICAL

## 2022-12-26 MED ORDER — LEVOTHYROXINE SODIUM 112 MCG PO TABS
112.0000 ug | ORAL_TABLET | Freq: Every day | ORAL | Status: DC
  Administered 2022-12-27 – 2022-12-29 (×3): 112 ug via ORAL
  Filled 2022-12-26 (×3): qty 1

## 2022-12-26 MED ORDER — MEDIHONEY WOUND/BURN DRESSING EX PSTE
1.0000 | PASTE | Freq: Every day | CUTANEOUS | Status: DC
  Administered 2022-12-26 – 2023-01-10 (×14): 1 via TOPICAL
  Filled 2022-12-26 (×4): qty 44

## 2022-12-26 MED ORDER — ENOXAPARIN SODIUM 40 MG/0.4ML IJ SOSY: 40.0000 mg | PREFILLED_SYRINGE | INTRAMUSCULAR | Status: DC

## 2022-12-26 MED ORDER — METHYLPREDNISOLONE SODIUM SUCC 40 MG IJ SOLR
40.0000 mg | Freq: Two times a day (BID) | INTRAMUSCULAR | Status: DC
  Administered 2022-12-26 – 2022-12-27 (×3): 40 mg via INTRAVENOUS
  Filled 2022-12-26 (×3): qty 1

## 2022-12-26 MED ORDER — APIXABAN 5 MG PO TABS
5.0000 mg | ORAL_TABLET | Freq: Two times a day (BID) | ORAL | Status: DC
  Administered 2022-12-26 – 2023-01-03 (×15): 5 mg via ORAL
  Filled 2022-12-26 (×16): qty 1

## 2022-12-26 MED ORDER — PANTOPRAZOLE SODIUM 40 MG PO TBEC
40.0000 mg | DELAYED_RELEASE_TABLET | Freq: Two times a day (BID) | ORAL | Status: DC
  Administered 2022-12-26 – 2023-01-03 (×15): 40 mg via ORAL
  Filled 2022-12-26 (×17): qty 1

## 2022-12-26 MED ORDER — IPRATROPIUM-ALBUTEROL 0.5-2.5 (3) MG/3ML IN SOLN
3.0000 mL | Freq: Four times a day (QID) | RESPIRATORY_TRACT | Status: DC | PRN
  Administered 2022-12-26 – 2023-01-03 (×4): 3 mL via RESPIRATORY_TRACT
  Filled 2022-12-26 (×3): qty 3

## 2022-12-26 MED ORDER — SODIUM CHLORIDE 0.9 % IV SOLN
2.0000 g | Freq: Three times a day (TID) | INTRAVENOUS | Status: DC
  Administered 2022-12-26 – 2022-12-28 (×7): 2 g via INTRAVENOUS
  Filled 2022-12-26 (×8): qty 12.5

## 2022-12-26 MED ORDER — ORAL CARE MOUTH RINSE: 15.0000 mL | OROMUCOSAL | Status: DC | PRN

## 2022-12-26 MED ORDER — GERHARDT'S BUTT CREAM
TOPICAL_CREAM | Freq: Two times a day (BID) | CUTANEOUS | Status: DC
  Administered 2023-01-01 – 2023-01-05 (×2): 1 via TOPICAL
  Filled 2022-12-26 (×3): qty 1

## 2022-12-26 MED ORDER — QUETIAPINE FUMARATE 50 MG PO TABS
50.0000 mg | ORAL_TABLET | Freq: Every day | ORAL | Status: DC
  Administered 2022-12-26: 50 mg via ORAL
  Filled 2022-12-26: qty 2

## 2022-12-26 MED ORDER — CARVEDILOL 3.125 MG PO TABS: 3.1250 mg | ORAL_TABLET | Freq: Two times a day (BID) | ORAL | Status: DC

## 2022-12-26 MED ORDER — GUAIFENESIN-DM 100-10 MG/5ML PO SYRP
5.0000 mL | ORAL_SOLUTION | ORAL | Status: DC | PRN
  Administered 2022-12-26 – 2022-12-31 (×12): 5 mL via ORAL
  Filled 2022-12-26 (×13): qty 5

## 2022-12-26 MED ORDER — ONDANSETRON HCL 4 MG/2ML IJ SOLN: 4.0000 mg | Freq: Four times a day (QID) | INTRAMUSCULAR | Status: DC | PRN

## 2022-12-26 MED ORDER — ENSURE ENLIVE PO LIQD
237.0000 mL | Freq: Two times a day (BID) | ORAL | Status: DC
  Administered 2022-12-26 – 2022-12-31 (×6): 237 mL via ORAL
  Filled 2022-12-26 (×6): qty 237

## 2022-12-26 MED ORDER — LISINOPRIL 5 MG PO TABS: 2.5000 mg | ORAL_TABLET | Freq: Every day | ORAL | Status: DC

## 2022-12-26 MED ORDER — PANTOPRAZOLE SODIUM 40 MG PO TBEC: 80.0000 mg | DELAYED_RELEASE_TABLET | Freq: Every day | ORAL | Status: DC

## 2022-12-26 NOTE — TOC CM/SW Note (Signed)
Transition of Care [facility]) - Inpatient Brief Assessment   Patient Details  Name: Amber Williams MRN: [id number] Date of Birth: 05/12/1975  Transition of Care [facility]) CM/SW Contact:    Villa Herb, LCSWA Phone Number: 12/26/2022, 2:15 PM   Clinical Narrative: Transition of Care Department [facility]) has reviewed patient and no TOC needs have been identified at this time. We will continue to monitor patient advancement through interdisciplinary progression rounds. If new patient transition needs arise, please place a TOC consult.  Transition of Care Asessment: Insurance and Status: Insurance coverage has been reviewed Patient has primary care physician: Yes Home environment has been reviewed: from home Prior level of function:: some assistance Prior/Current Home Services: No current home services Social Determinants of Health Reivew: SDOH reviewed no interventions necessary Readmission risk has been reviewed: Yes Transition of care needs: no transition of care needs at this time
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
## 2022-12-26 NOTE — ED Notes (Signed)
Urine canister changed 

## 2022-12-26 NOTE — Hospital Course (Addendum)
47 year old female with a history of multiple sclerosis, anal cancer, DVT on apixaban, hypertension, hyperlipidemia presenting with shortness of breath.  Notably, patient was recently admitted to [facility] from 12/14/2022 to 12/24/2022.  During that hospitalization, the patient was treated for acute on chronic respiratory failure secondary to COVID-19.  The patient required heated high flow as well as BiPAP.[name]  She was subsequently weaned to 3 to 4 L nasal cannula.  At the time of discharge, the patient was prescribed 5 additional days of doxycycline and a prolonged prednisone taper.  She was discharged home with home health therapy.  She had a CTA chest on 12/15/2022 which was negative for PE but showed extensive patchy consolidations and groundglass opacities throughout bilateral lungs, predominantly in the upper lobes.  There was associated air bronchograms.  There is trace bilateral pleural effusions, right greater than left.  12/20/2022 ultrasound of the left lower extremity was negative for DVT.  She was discharged home with furosemide 20 mg daily.  In the emergency department, the patient was febrile up to 101.7 F.  She was tachycardic up to 125.  She was hypotensive initially.  She was given stress steroids and LR x 2 L and albumin 25 grams.  Subsequently, her blood pressure improved.  The patient was started on vancomycin and cefepime.

## 2022-12-26 NOTE — Progress Notes (Signed)
RT placed patient on HHFNC at 15L and 100% FiO2. Patient tolerating well at this time. RN aware.

## 2022-12-26 NOTE — Progress Notes (Addendum)
PROGRESS NOTE  LAVAEH CALLO ZOX:[id number] DOB: [birth year] DOA: 12/25/2022 PCP: Mechele Claude, MD  Brief History:  47 year old female with a history of multiple sclerosis, anal cancer, DVT on apixaban, hypertension, hyperlipidemia presenting with shortness of breath.  Notably, patient was recently admitted to [facility] from 12/14/2022 to 12/24/2022.  During that hospitalization, the patient was treated for acute on chronic respiratory failure secondary to COVID-19.  The patient required heated high flow as well as BiPAP.[name]  She was subsequently weaned to 3 to 4 L nasal cannula.  At the time of discharge, the patient was prescribed 5 additional days of doxycycline and a prolonged prednisone taper.  She was discharged home with home health therapy.  She had a CTA chest on 12/15/2022 which was negative for PE but showed extensive patchy consolidations and groundglass opacities throughout bilateral lungs, predominantly in the upper lobes.  There was associated air bronchograms.  There is trace bilateral pleural effusions, right greater than left.  12/20/2022 ultrasound of the left lower extremity was negative for DVT.  She was discharged home with furosemide 20 mg daily.  In the emergency department, the patient was febrile up to 101.7 F.  She was tachycardic up to 125.  She was hypotensive initially.  She was given stress steroids and IV fluid bolus.   Assessment/Plan:  Severe sepsis -Present on admission -presented with fever, respiratory failure, tachycardia, tachypnea -lactic acid 1.7 -Secondary to pneumonia -Presented with fever, leukocytosis, respiratory failure -Check procalcitonin -continue empiric cefepime and vanc -MRSA screen -Continue IV fluids  Acute on chronic respiratory failure with hypoxia -Secondary to pneumonia -Patient was discharged home from [facility] with 3L [place] -Currently on 10 L with saturation 94% -Wean oxygen for saturation >90% -Continue IV steroids  Lobar  pneumonia -12/26/2022 CTA chest negative for PE; patchy consolidation and GGO bilateral -Continue empiric vancomycin and cefepime -Patient was COVID-19 positive on 12/03/2022 and 12/16/2022 -Continue IV steroids  History of lower extremity DVT -Continue apixaban  Multiple sclerosis -Patient is on Ocrevus, last dose January 2024 -She needs to follow-up with her neurologist at [facility]  Essential hypertension -Holding lisinopril and carvedilol secondary to hypotension  Hypothyroidism -Continue Synthroid  SVC occlusion -no signs of SVC syndrome -discussed with vascular, Dr. Nino Glow chronic in light of present collaterals--monitor for now  Sacral pressure injury -appreciate wound care consult -does not appear infection on visualization       Family Communication:   no Family at bedside  Consultants:  none  Code Status:  FULL   DVT Prophylaxis:  apixaban   Procedures: As Listed in Progress Note Above  Antibiotics: Vanc 8/26>> Cefepime 8/26>>      Subjective: Pt remains sob, but a little better.  Denies n/v/d, abd pain  Objective: Vitals:   12/26/22 0145 12/26/22 0330 12/26/22 0430 12/26/22 0530  BP: 125/66 127/76 129/73 124/83  Pulse: 93 91 82 75  Resp: 13 (!) 23 (!) 23 (!) 21  Temp:      TempSrc:      SpO2: 96% 91% 96% 97%  Weight:      Height:        Intake/Output Summary (Last 24 hours) at 12/26/2022 0705 Last data filed at 12/26/2022 0152 Gross per 24 hour  Intake 2455.68 ml  Output --  Net 2455.68 ml   Weight change:  Exam:  General:  Pt is alert, follows commands appropriately, not in acute distress HEENT: No icterus, No thrush, No neck mass,  [place]/AT Cardiovascular: RRR, S1/S2, no rubs, no gallops Respiratory: bilateral rale.  No wheeze Abdomen: Soft/+BS, non tender, non distended, no guarding Extremities: No edema, No lymphangitis, No petechiae, No rashes, no synovitis   Data Reviewed: I have personally reviewed following labs and  imaging studies Basic Metabolic Panel: Recent Labs  Lab 12/25/22 2054  NA 130*  K 3.8  CL 96*  CO2 23  GLUCOSE 147*  BUN 14  CREATININE 0.98  CALCIUM 9.3   Liver Function Tests: Recent Labs  Lab 12/25/22 2054  AST 28  ALT 30  ALKPHOS 85  BILITOT 0.8  PROT 5.6*  ALBUMIN 3.0*   No results for input(s): "LIPASE", "AMYLASE" in the last 168 hours. No results for input(s): "AMMONIA" in the last 168 hours. Coagulation Profile: Recent Labs  Lab 12/25/22 2054  INR 1.7*   CBC: Recent Labs  Lab 12/25/22 2054  WBC 12.8*  NEUTROABS 11.9*  HGB 10.5*  HCT 34.8*  MCV 88.3  PLT 287   Cardiac Enzymes: No results for input(s): "CKTOTAL", "CKMB", "CKMBINDEX", "TROPONINI" in the last 168 hours. BNP: Invalid input(s): "POCBNP" CBG: No results for input(s): "GLUCAP" in the last 168 hours. HbA1C: No results for input(s): "HGBA1C" in the last 72 hours. Urine analysis:    Component Value Date/Time   COLORURINE YELLOW 12/26/2022 0006   APPEARANCEUR CLEAR 12/26/2022 0006   APPEARANCEUR Clear 08/09/2022 1326   LABSPEC 1.020 12/26/2022 0006   LABSPEC 1.015 02/05/2012 1732   PHURINE 7.0 12/26/2022 0006   GLUCOSEU NEGATIVE 12/26/2022 0006   GLUCOSEU NEGATIVE 02/05/2012 1732   HGBUR SMALL (A) 12/26/2022 0006   BILIRUBINUR NEGATIVE 12/26/2022 0006   BILIRUBINUR Negative 08/09/2022 1326   BILIRUBINUR NEGATIVE 02/05/2012 1732   KETONESUR NEGATIVE 12/26/2022 0006   PROTEINUR 100 (A) 12/26/2022 0006   NITRITE NEGATIVE 12/26/2022 0006   LEUKOCYTESUR SMALL (A) 12/26/2022 0006   LEUKOCYTESUR NEGATIVE 02/05/2012 1732   Sepsis Labs: @LABRCNTIP (procalcitonin:4,lacticidven:4) ) Recent Results (from the past 240 hour(s))  Blood Culture (routine x 2)     Status: None (Preliminary result)   Collection Time: 12/25/22  8:54 PM   Specimen: BLOOD  Result Value Ref Range Status   Specimen Description BLOOD BLOOD RIGHT ARM  Final   Special Requests   Final    BOTTLES DRAWN AEROBIC AND  ANAEROBIC Blood Culture adequate volume Performed at [facility], [address], [place], [place] [address]    Culture PENDING  Incomplete   Report Status PENDING  Incomplete  Blood Culture (routine x 2)     Status: None (Preliminary result)   Collection Time: 12/25/22  9:26 PM   Specimen: BLOOD  Result Value Ref Range Status   Specimen Description BLOOD BLOOD LEFT HAND  Final   Special Requests   Final    BOTTLES DRAWN AEROBIC AND ANAEROBIC Blood Culture adequate volume Performed at [facility], [address], [place], [place] [address]    Culture PENDING  Incomplete   Report Status PENDING  Incomplete     Scheduled Meds:  feeding supplement  237 mL Oral BID BM   Continuous Infusions:  vancomycin Stopped (12/26/22 0009)    Procedures/Studies: CT Angio Chest PE W and/or Wo Contrast  Result Date: 12/25/2022 CLINICAL DATA:  Recent history of pneumonia with hypoxia and shortness of breath, initial encounter EXAM: CT ANGIOGRAPHY CHEST WITH CONTRAST TECHNIQUE: Multidetector CT imaging of the chest was performed using the standard protocol during bolus administration of intravenous contrast. Multiplanar CT image reconstructions and MIPs were obtained  to evaluate the vascular anatomy. RADIATION DOSE REDUCTION: This exam was performed according to the departmental dose-optimization program which includes automated exposure control, adjustment of the mA and/or kV according to patient size and/or use of iterative reconstruction technique. CONTRAST:  OMNIPAQUE IOHEXOL 350 MG/ML SOLN COMPARISON:  Plain film from earlier in the same day. CT from 12/14/2022 FINDINGS: Cardiovascular: Thoracic aorta shows no aneurysmal dilatation or dissection. Heart is at the upper limits of normal in size. No coronary calcifications are seen. Pulmonary artery shows a normal branching pattern bilaterally. No intraluminal filling defect to suggest pulmonary embolism is noted. Right chest wall port is noted. There  are changes consistent with occlusion of the SVC proximally with reconstitution distally via collateral flow from the azygous. Multiple chest wall collaterals are seen on the right. Mediastinum/Nodes: Thoracic inlet is within normal limits. Previously seen thyroid nodule on the right is not included on these images. No hilar or mediastinal adenopathy is noted. The esophagus as visualized is within normal limits. Lungs/Pleura: Lungs demonstrate diffuse patchy consolidation with ground-glass opacities roughly similar to that seen on the prior exam. Mild progressive atelectasis in the bases is seen. Minimal effusion is noted on the right. No parenchymal nodules are seen. Upper Abdomen: Visualized upper abdomen is within normal limits. Musculoskeletal: No chest wall abnormality. No acute or significant osseous findings. Review of the MIP images confirms the above findings. IMPRESSION: No evidence of pulmonary emboli. High-grade SVC stenosis versus occlusion with reconstitution via multiple chest wall collaterals on the right as well as the azygous vein. Patchy consolidation and ground-glass opacities are noted roughly similar that seen on the prior CT. Some increased atelectatic change in the bases is noted. Electronically Signed   By: Alcide Clever M.D.   On: 12/25/2022 22:55   DG Chest Port 1 View  Result Date: 12/25/2022 CLINICAL DATA:  Hypoxia, sepsis EXAM: PORTABLE CHEST 1 VIEW COMPARISON:  12/21/2022 FINDINGS: Single frontal view of the chest demonstrates a stable cardiac silhouette. There is multifocal bilateral airspace disease, greatest in the right perihilar and left infrahilar regions. Trace left pleural effusion unchanged. No pneumothorax. No acute bony abnormalities. IMPRESSION: 1. Stable multifocal bilateral airspace disease, consistent with pneumonia. 2. Stable trace left pleural effusion. Electronically Signed   By: Sharlet Salina M.D.   On: 12/25/2022 21:19    Catarina Hartshorn, DO  [facility]  If 7PM-7AM, please contact night-coverage [email or web address] Password [email or web address] 12/26/2022, 7:05 AM   LOS: 0 days
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
## 2022-12-26 NOTE — Consult Note (Addendum)
WOC Nurse Consult Note: patient with history of MS; this consult performed remotely utilizing EMR including photo documentation and secure chat with bedside RN  Reason for Consult: sacral wound  Wound type: 1. Unstageable Pressure Injuries  Sacrum 2.  Bilateral upper buttocks Unstageable Pressure Injuries  Pressure Injury POA: Yes Measurement: see nursing flowsheet  Wound bed: 1.  sacrum 90% yellow brown necrotic tissue 10% pink moist 2. Bilateral buttocks ulcers 50% yellow brown 50% pink moist  Drainage (amount, consistency, odor) see nursing flowsheet  Periwound: erythema and scattered partial thickness skin loss,  appearance of chronic tissue damage with dark dusky discoloration  Dressing procedure/placement/frequency: Clean sacral and bilateral upper buttocks ulcers with NS, apply Medihoney to wound beds daily.  Cover with dry gauze and silicone foam or ABD pad whichever is preferred.    Will order Gerhardts Butt Cream to be applied twice daily and prn soiling to bilateral buttocks.   Patient would benefit from a low air loss mattress for moisture management and pressure redistribution.   POC discussed with bedside nurse.  WOC team will not follow at this time. Re-consult for further wound care needs.   Thank you,    Priscella Mann MSN, RN-BC, [employer] [phone]

## 2022-12-27 ENCOUNTER — Inpatient Hospital Stay (HOSPITAL_COMMUNITY): Payer: Medicare HMO

## 2022-12-27 DIAGNOSIS — A419 Sepsis, unspecified organism: Secondary | ICD-10-CM | POA: Diagnosis not present

## 2022-12-27 DIAGNOSIS — J9601 Acute respiratory failure with hypoxia: Secondary | ICD-10-CM | POA: Diagnosis not present

## 2022-12-27 DIAGNOSIS — J189 Pneumonia, unspecified organism: Secondary | ICD-10-CM | POA: Diagnosis not present

## 2022-12-27 LAB — POCT I-STAT 7, (LYTES, BLD GAS, ICA,H+H)
Acid-Base Excess: 4 mmol/L — ABNORMAL HIGH (ref 0.0–2.0)
Bicarbonate: 29.7 mmol/L — ABNORMAL HIGH (ref 20.0–28.0)
Calcium, Ion: 1.34 mmol/L (ref 1.15–1.40)
HCT: 30 % — ABNORMAL LOW (ref 36.0–46.0)
Hemoglobin: 10.2 g/dL — ABNORMAL LOW (ref 12.0–15.0)
O2 Saturation: 99 %
Potassium: 3.6 mmol/L (ref 3.5–5.1)
Sodium: 131 mmol/L — ABNORMAL LOW (ref 135–145)
TCO2: 31 mmol/L (ref 22–32)
pCO2 arterial: 46.7 mmHg (ref 32–48)
pH, Arterial: 7.411 (ref 7.35–7.45)
pO2, Arterial: 136 mmHg — ABNORMAL HIGH (ref 83–108)

## 2022-12-27 LAB — BLOOD GAS, ARTERIAL
Acid-Base Excess: 0.7 mmol/L (ref 0.0–2.0)
Bicarbonate: 25.4 mmol/L (ref 20.0–28.0)
Drawn by: 41977
FIO2: 80 %
O2 Saturation: 88.5 %
Patient temperature: 37
pCO2 arterial: 40 mmHg (ref 32–48)
pH, Arterial: 7.41 (ref 7.35–7.45)
pO2, Arterial: 54 mmHg — ABNORMAL LOW (ref 83–108)

## 2022-12-27 LAB — GLUCOSE, CAPILLARY
Glucose-Capillary: 111 mg/dL — ABNORMAL HIGH (ref 70–99)
Glucose-Capillary: 169 mg/dL — ABNORMAL HIGH (ref 70–99)

## 2022-12-27 MED ORDER — PANTOPRAZOLE SODIUM 40 MG IV SOLR
40.0000 mg | Freq: Once | INTRAVENOUS | Status: AC
  Administered 2022-12-27: 40 mg via INTRAVENOUS
  Filled 2022-12-27: qty 10

## 2022-12-27 MED ORDER — FUROSEMIDE 10 MG/ML IJ SOLN: 40.0000 mg | Freq: Once | INTRAMUSCULAR | Status: DC

## 2022-12-27 MED ORDER — FUROSEMIDE 10 MG/ML IJ SOLN
60.0000 mg | Freq: Once | INTRAMUSCULAR | Status: AC
  Administered 2022-12-27: 60 mg via INTRAVENOUS
  Filled 2022-12-27: qty 6

## 2022-12-27 MED ORDER — MIDAZOLAM HCL 2 MG/2ML IJ SOLN: 2.0000 mg | Freq: Once | INTRAMUSCULAR | Status: AC

## 2022-12-27 MED ORDER — BENZONATATE 100 MG PO CAPS
100.0000 mg | ORAL_CAPSULE | Freq: Three times a day (TID) | ORAL | Status: DC
  Administered 2022-12-27 – 2023-01-03 (×22): 100 mg via ORAL
  Filled 2022-12-27 (×28): qty 1

## 2022-12-27 MED ORDER — HYDROCOD POLI-CHLORPHE POLI ER 10-8 MG/5ML PO SUER
5.0000 mL | Freq: Two times a day (BID) | ORAL | Status: DC
  Administered 2022-12-27 – 2023-01-03 (×14): 5 mL via ORAL
  Filled 2022-12-27 (×15): qty 5

## 2022-12-27 MED ORDER — MIDAZOLAM HCL 2 MG/2ML IJ SOLN
INTRAMUSCULAR | Status: AC
  Administered 2022-12-27: 2 mg
  Filled 2022-12-27: qty 2

## 2022-12-27 MED ORDER — DOCUSATE SODIUM 100 MG PO CAPS
100.0000 mg | ORAL_CAPSULE | Freq: Two times a day (BID) | ORAL | Status: DC | PRN
  Administered 2022-12-31: 100 mg via ORAL
  Filled 2022-12-27: qty 1

## 2022-12-27 MED ORDER — METHYLPREDNISOLONE SODIUM SUCC 125 MG IJ SOLR
80.0000 mg | Freq: Every day | INTRAMUSCULAR | Status: DC
  Administered 2022-12-28 – 2022-12-30 (×3): 80 mg via INTRAVENOUS
  Filled 2022-12-27 (×3): qty 2

## 2022-12-27 MED ORDER — METHYLPREDNISOLONE SODIUM SUCC 40 MG IJ SOLR: 40.0000 mg | Freq: Every day | INTRAMUSCULAR | Status: DC

## 2022-12-27 MED ORDER — POLYETHYLENE GLYCOL 3350 17 G PO PACK
17.0000 g | PACK | Freq: Every day | ORAL | Status: DC | PRN
  Administered 2022-12-31: 17 g via ORAL
  Filled 2022-12-27: qty 1

## 2022-12-27 MED ORDER — DEXMEDETOMIDINE HCL IN NACL 400 MCG/100ML IV SOLN
INTRAVENOUS | Status: AC
  Filled 2022-12-27: qty 100

## 2022-12-27 MED ORDER — METHYLPREDNISOLONE SODIUM SUCC 40 MG IJ SOLR
40.0000 mg | Freq: Once | INTRAMUSCULAR | Status: AC
  Administered 2022-12-27: 40 mg via INTRAVENOUS
  Filled 2022-12-27: qty 1

## 2022-12-27 MED ORDER — DEXMEDETOMIDINE HCL IN NACL 400 MCG/100ML IV SOLN
0.0000 ug/kg/h | INTRAVENOUS | Status: DC
  Administered 2022-12-27: 0.7 ug/kg/h via INTRAVENOUS
  Administered 2022-12-27: 0.6 ug/kg/h via INTRAVENOUS
  Administered 2022-12-28: 0.7 ug/kg/h via INTRAVENOUS
  Filled 2022-12-27 (×2): qty 100

## 2022-12-27 NOTE — Progress Notes (Addendum)
Accessed port with power needle. Pt denies complaints at this time. Flushed with total of 30ml of NS. No blood return. Pt states she tastes the NS and denied pain at port site. States she has had the port for approx 15 years and recently she said she has had issues with blood return. Primary RN aware and instructed to run only KVO fluids through it at this time.   49- RN Westley Gambles flushed with 20ml of NS with no complaints by patient. Sister at beside and confirmed issues with blood return lately. Primary RN aware of possible tpa instillation tonight.

## 2022-12-27 NOTE — Plan of Care (Signed)
  Problem: Clinical Measurements: Goal: Ability to maintain a body temperature in the normal range will improve Outcome: Progressing   Problem: Respiratory: Goal: Ability to maintain adequate ventilation will improve Outcome: Progressing Goal: Ability to maintain a clear airway will improve Outcome: Progressing   Problem: Education: Goal: Knowledge of General Education information will improve Description: Including pain rating scale, medication(s)/side effects and non-pharmacologic comfort measures Outcome: Progressing   Problem: Health Behavior/Discharge Planning: Goal: Ability to manage health-related needs will improve Outcome: Progressing   Problem: Clinical Measurements: Goal: Ability to maintain clinical measurements within normal limits will improve Outcome: Progressing Goal: Will remain free from infection Outcome: Progressing Goal: Diagnostic test results will improve Outcome: Progressing Goal: Cardiovascular complication will be avoided Outcome: Progressing   Problem: Nutrition: Goal: Adequate nutrition will be maintained Outcome: Progressing   Problem: Coping: Goal: Level of anxiety will decrease Outcome: Progressing   Problem: Elimination: Goal: Will not experience complications related to bowel motility Outcome: Progressing Goal: Will not experience complications related to urinary retention Outcome: Progressing   Problem: Pain Managment: Goal: General experience of comfort will improve Outcome: Progressing   Problem: Safety: Goal: Ability to remain free from injury will improve Outcome: Progressing   Problem: Activity: Goal: Ability to tolerate increased activity will improve Outcome: Not Progressing   Problem: Clinical Measurements: Goal: Respiratory complications will improve Outcome: Not Progressing   Problem: Activity: Goal: Risk for activity intolerance will decrease Outcome: Not Progressing   Problem: Skin Integrity: Goal: Risk for  impaired skin integrity will decrease Outcome: Not Progressing

## 2022-12-27 NOTE — Plan of Care (Signed)
  Problem: Activity: Goal: Ability to tolerate increased activity will improve Outcome: Progressing   Problem: Clinical Measurements: Goal: Ability to maintain a body temperature in the normal range will improve Outcome: Progressing   Problem: Respiratory: Goal: Ability to maintain adequate ventilation will improve Outcome: Not Progressing Goal: Ability to maintain a clear airway will improve Outcome: Not Progressing   Problem: Education: Goal: Knowledge of General Education information will improve Description: Including pain rating scale, medication(s)/side effects and non-pharmacologic comfort measures Outcome: Progressing   Problem: Health Behavior/Discharge Planning: Goal: Ability to manage health-related needs will improve Outcome: Progressing   Problem: Clinical Measurements: Goal: Ability to maintain clinical measurements within normal limits will improve Outcome: Progressing Goal: Will remain free from infection Outcome: Progressing Goal: Diagnostic test results will improve Outcome: Progressing Goal: Respiratory complications will improve Outcome: Progressing Goal: Cardiovascular complication will be avoided Outcome: Progressing   Problem: Activity: Goal: Risk for activity intolerance will decrease Outcome: Progressing   Problem: Nutrition: Goal: Adequate nutrition will be maintained Outcome: Progressing   Problem: Coping: Goal: Level of anxiety will decrease Outcome: Progressing   Problem: Elimination: Goal: Will not experience complications related to bowel motility Outcome: Progressing Goal: Will not experience complications related to urinary retention Outcome: Progressing   Problem: Pain Managment: Goal: General experience of comfort will improve Outcome: Progressing   Problem: Safety: Goal: Ability to remain free from injury will improve Outcome: Progressing   Problem: Skin Integrity: Goal: Risk for impaired skin integrity will  decrease Outcome: Progressing

## 2022-12-27 NOTE — Care Management Important Message (Signed)
Important Message  Patient Details  Name: Amber Williams MRN: [id number] Date of Birth: 03-15-1976   Medicare Important Message Given:  N/A - LOS <3 / Initial given by admissions     Corey Harold 12/27/2022, 10:29 AM

## 2022-12-27 NOTE — Progress Notes (Signed)
[facility] PROGRESS NOTE  Amber Williams (DOB: 01-01-1976) BJY:[id number] PCP: Mechele Claude, MD  Brief Narrative: Amber Williams is a 47 y.o. female with a history of MS on ocrelizumab, hx DVT, HTN, SVC occlusion, hypothyroidism, and recent admissions for covid-19 pneumonia most recently discharged from [facility] 8/25 on 3L O2 who presented to the ED on 12/25/2022 with respiratory distress. Work up showed no PE on CTA, though there are severe widespread infiltrates. She was admitted to the ICU at [facility] on HHFNC, given antibiotics, IV steroids, and a trial of lasix. PCCM was consulted due to continued respiratory distress.   Subjective: Seen this morning reporting she doesn't feel any better than when she arrived except that with the high flow of oxygen her air hunger is decreased. She is coughing persistently, severely, at times productive. This afternoon she confirms no real improvement after lasix though she's had 1,200cc UOP after the dose. Boyfriend at bedside this PM.   Objective: BP 124/70   Pulse (!) 136   Temp 98.1 F (36.7 C) (Oral)   Resp (!) 29   Ht 5\' 5"  (1.651 m)   Wt 80.7 kg   SpO2 93%   BMI 29.61 kg/m   Gen: 46yo F in some distress Pulm: Coarse crackles bilaterally, tachypneic into high 20's, good air exchange, though slightly diminished at left base.   CV: Regular tachycardia, ST on monitor. No MRG. No pitting LE edema. GI: Soft, NT, ND, +BS. Neuro: Alert and oriented. No new focal deficits. Ext: Warm, no deformities.  Assessment & Plan: Severe sepsis and acute hypoxic respiratory failure due to covid-19 pneumonia and post-covid possible superimposed multifocal bacterial pneumonia: - Escalating HHFNC O2 requirements. ABG with pCO2 of 40 which may indicate rising despite tachypnea. I worry she'll tire out. Will start on BiPAP. Discussed with Dr. Merrily Pew and the patient/family at bedside. Will transfer to ICU under CCM service at [facility]. Confirmed full code, understands  risks of intubation/mechanical ventilation.  - CTA compared to CTs from 12 and 17 days prior, relatively stable from 12 days ago, though severe infiltrates throughout L > R.  - Continue broad antibiotic coverage empirically. PCT is < 0.10 and MRSA PCR is negative, S. pneumo Ag neg. so I'll defer to PCCM for ongoing antimicrobial coverage. Blood Cx's NGTD. Sputum Cx ordered. - Trial of diuresis with good UOP and no significant improvement in WOB/oxygenation.  - Maximizing antitussives with dextromethorphan, guaifenesin, tessalon, and tussionex.  - Continue IV steroids, anticoagulation, duonebs prn.   MS:  - Was due for biannual ocrelizumab earlier this month. We will continue holding that as it is working against [name] in treating infection.   Sacral wound, POA: Not infected per admitting provider. Recommend offloading as able, medihoney  Hypothyroidism: Continue synthroid  Depression: Continue quetiapine   SVC occlusion: Collaterals noted on CT and exam suggesting chronicity to this. Continue anticoagulation and plan for vascular surgery follow up.   Tyrone Nine, MD [facility] [email or web address] 12/27/2022, 4:46 PM

## 2022-12-27 NOTE — Progress Notes (Addendum)
Patient was reevaluated on video camera:  She received 60 mg of IV Lasix with good output but her respiratory status remained same even though her oxygen saturation improved to 95% but she continued to remain tachycardic, tachypneic and struggling to breathe  Will transfer patient to [facility], ICU under PCCM   [facility] was called  Critical care time spent 15 minutes     Cheri Fowler, MD [facility] See Amion for pager If no response to pager, please call [phone] until 7pm After 7pm, Please call E-link [phone]

## 2022-12-27 NOTE — Progress Notes (Signed)
Patient arrived from [facility], on BiPAP on 10/5 with 100% FiO2, she was noted to be tachypneic, tachycardic and anxious, continue to complain of shortness of breath and cough  She was moved to regular bed, she was given 2 mg Versed with improvement in work of breathing and improvement in tidal volume on BiPAP Will start on Precedex at 0.6 mics per KG  Will get ABG in 1 hour Will continue steroid 1 mg/kg in divided doses and continue IV Lasix She is on broad-spectrum antibiotics  Plan is to keep her on BiPAP tonight and transition to high flow nasal cannula in a.m. though she remains high risk of endotracheal intubation   Additional critical care time spent 35 minutes    Cheri Fowler, MD [facility] See Amion for pager If no response to pager, please call [phone] until 7pm After 7pm, Please call E-link [phone]
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
## 2022-12-27 NOTE — Progress Notes (Signed)
eLink Physician-Brief Progress Note Patient Name: VALINA HOEPPNER DOB: Jun 18, 1975 MRN: [id number]   Date of Service  12/27/2022  HPI/Events of Note  47 year old female transferred from [facility] on BiPAP with respiratory failure.  She remains somnolent but arousable and moving all extremities appropriately.  Has family at bedside.  Anticipate plan to maintain BiPAP throughout the night tonight and transition to high flow nasal cannula in the morning.  eICU Interventions  Questionable if she will tolerate oral medications tonight.  Hold the Eliquis dose for tonight.  Switch pantoprazole to one-time IV dose.  Reviewed other oral medications-quetiapine nightly and levothyroxine every morning-both of these can be held if she remains NPO on BiPAP     Intervention Category Intermediate Interventions: Respiratory distress - evaluation and management  Kyston Gonce 12/27/2022, 9:06 PM

## 2022-12-27 NOTE — Progress Notes (Signed)
Pt placed on BIPAP 10/5 with a rate of 18 on 100% and is tolerating okay. RT will monitor.

## 2022-12-28 ENCOUNTER — Inpatient Hospital Stay (HOSPITAL_COMMUNITY): Payer: Medicare HMO

## 2022-12-28 DIAGNOSIS — R0603 Acute respiratory distress: Secondary | ICD-10-CM | POA: Diagnosis not present

## 2022-12-28 DIAGNOSIS — J189 Pneumonia, unspecified organism: Secondary | ICD-10-CM | POA: Diagnosis not present

## 2022-12-28 LAB — MAGNESIUM: Magnesium: 2 mg/dL (ref 1.7–2.4)

## 2022-12-28 LAB — GLUCOSE, CAPILLARY
Glucose-Capillary: 173 mg/dL — ABNORMAL HIGH (ref 70–99)
Glucose-Capillary: 129 mg/dL — ABNORMAL HIGH (ref 70–99)
Glucose-Capillary: 134 mg/dL — ABNORMAL HIGH (ref 70–99)
Glucose-Capillary: 165 mg/dL — ABNORMAL HIGH (ref 70–99)

## 2022-12-28 LAB — BASIC METABOLIC PANEL
Anion gap: 10 (ref 5–15)
BUN: 17 mg/dL (ref 6–20)
CO2: 27 mmol/L (ref 22–32)
Calcium: 9.5 mg/dL (ref 8.9–10.3)
Chloride: 94 mmol/L — ABNORMAL LOW (ref 98–111)
Creatinine, Ser: 0.91 mg/dL (ref 0.44–1.00)
GFR, Estimated: >60 mL/min (ref >60)
Glucose, Bld: 168 mg/dL — ABNORMAL HIGH (ref 70–99)
Potassium: 3.1 mmol/L — ABNORMAL LOW (ref 3.5–5.1)
Sodium: 131 mmol/L — ABNORMAL LOW (ref 135–145)

## 2022-12-28 LAB — ECHOCARDIOGRAM COMPLETE
Area-P 1/2: 5.31 cm2
Calc EF: 35.3 %
Height: 65 in
MV M vel: 0.8 m/s
MV Peak grad: 2.6 mmHg
S' Lateral: 3.1 cm
Single Plane A2C EF: 34.4 %
Single Plane A4C EF: 34.7 %
Weight: 2691.38 [oz_av]

## 2022-12-28 LAB — CBC
HCT: 32.6 % — ABNORMAL LOW (ref 36.0–46.0)
Hemoglobin: 10.3 g/dL — ABNORMAL LOW (ref 12.0–15.0)
MCH: 27 pg (ref 26.0–34.0)
MCHC: 31.6 g/dL (ref 30.0–36.0)
MCV: 85.3 fL (ref 80.0–100.0)
Platelets: 262 10*3/uL (ref 150–400)
RBC: 3.82 MIL/uL — ABNORMAL LOW (ref 3.87–5.11)
RDW: 17.1 % — ABNORMAL HIGH (ref 11.5–15.5)
WBC: 13.6 10*3/uL — ABNORMAL HIGH (ref 4.0–10.5)
nRBC: 0 % (ref 0.0–0.2)

## 2022-12-28 LAB — PHOSPHORUS: Phosphorus: 4.1 mg/dL (ref 2.5–4.6)

## 2022-12-28 LAB — MRSA NEXT GEN BY PCR, NASAL: MRSA by PCR Next Gen: NOT DETECTED

## 2022-12-28 LAB — LEGIONELLA PNEUMOPHILA SEROGP 1 UR AG: L. pneumophila Serogp 1 Ur Ag: NEGATIVE

## 2022-12-28 MED ORDER — PERFLUTREN LIPID MICROSPHERE
1.0000 mL | INTRAVENOUS | Status: AC | PRN
  Administered 2022-12-28: 5 mL via INTRAVENOUS
  Filled 2022-12-28: qty 10

## 2022-12-28 MED ORDER — LORAZEPAM 0.5 MG PO TABS
0.5000 mg | ORAL_TABLET | Freq: Four times a day (QID) | ORAL | Status: DC | PRN
  Administered 2022-12-28 – 2023-01-01 (×8): 0.5 mg via ORAL
  Filled 2022-12-28 (×8): qty 1

## 2022-12-28 MED ORDER — LACTATED RINGERS IV BOLUS
500.0000 mL | Freq: Once | INTRAVENOUS | Status: AC
  Administered 2022-12-28: 500 mL via INTRAVENOUS

## 2022-12-28 MED ORDER — PAROXETINE HCL 20 MG PO TABS
20.0000 mg | ORAL_TABLET | Freq: Every day | ORAL | Status: DC
  Administered 2022-12-28 – 2023-01-02 (×6): 20 mg via ORAL
  Filled 2022-12-28 (×8): qty 1

## 2022-12-28 MED ORDER — INSULIN ASPART 100 UNIT/ML IJ SOLN
0.0000 [IU] | Freq: Three times a day (TID) | INTRAMUSCULAR | Status: DC
  Administered 2022-12-28 (×2): 1 [IU] via SUBCUTANEOUS

## 2022-12-28 MED ORDER — AMOXICILLIN-POT CLAVULANATE 875-125 MG PO TABS
1.0000 | ORAL_TABLET | Freq: Two times a day (BID) | ORAL | Status: AC
  Administered 2022-12-28 – 2022-12-31 (×8): 1 via ORAL
  Filled 2022-12-28 (×8): qty 1

## 2022-12-28 MED ORDER — INSULIN ASPART 100 UNIT/ML IJ SOLN: 0.0000 [IU] | Freq: Every day | INTRAMUSCULAR | Status: DC

## 2022-12-28 MED ORDER — POTASSIUM CHLORIDE 10 MEQ/100ML IV SOLN
10.0000 meq | INTRAVENOUS | Status: AC
  Administered 2022-12-28 (×6): 10 meq via INTRAVENOUS
  Filled 2022-12-28 (×6): qty 100

## 2022-12-28 MED ORDER — QUETIAPINE FUMARATE 100 MG PO TABS
100.0000 mg | ORAL_TABLET | Freq: Every day | ORAL | Status: DC
  Administered 2022-12-28 – 2023-01-02 (×6): 100 mg via ORAL
  Filled 2022-12-28 (×6): qty 1

## 2022-12-28 NOTE — Progress Notes (Addendum)
eLink Physician-Brief Progress Note Patient Name: Amber Williams DOB: [birth year] MRN: [id number]   Date of Service  12/28/2022  HPI/Events of Note  47 year old initially presented with hypoxic respiratory failure in the setting of pneumonia from COVID with baseline MS and hypothyroidism.  Worsening tachycardia tonight with rates 130s-140s without any evidence of distress.  Tachycardia did not respond to LR bolus or Ativan earlier in the day.  eICU Interventions  ECG with sinus tachycardia up to 150s.  CT PE was performed on 8/26 with no evidence of PE.  Continue observation for now, sinus tachycardia does not inherently need to be treated.  I wonder if there is a component of withdrawal?  If she becomes hypotensive, can pursue more rate control therapies.   2240 -borderline blood pressures now with MAP 60-65.  Will attempt 500 cc LR bolus.  Intervention Category Intermediate Interventions: Arrhythmia - evaluation and management  [name] 12/28/2022, 10:20 PM

## 2022-12-28 NOTE — Progress Notes (Signed)
[facility] ADULT ICU REPLACEMENT PROTOCOL   The patient does apply for the [facility] Adult ICU Electrolyte Replacment Protocol based on the criteria listed below:   1.Exclusion criteria: TCTS, ECMO, Dialysis, and Myasthenia Gravis patients 2. Is GFR >/= 30 ml/min? Yes.    Patient's GFR today is >60 3. Is SCr </= 2? Yes.   Patient's SCr is 0.91 mg/dL 4. Did SCr increase >/= 0.5 in 24 hours? No. 5.Pt's weight >40kg  Yes.   6. Abnormal electrolyte(s): K+ 3.1  7. Electrolytes replaced per protocol 8.  Call MD STAT for K+ </= 2.5, Phos </= 1, or Mag </= 1 Physician:  Larinda Buttery [facility] 12/28/2022 3:49 AM
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
## 2022-12-28 NOTE — Plan of Care (Signed)

## 2022-12-28 NOTE — Progress Notes (Signed)
  Echocardiogram 2D Echocardiogram has been performed.  Amber Williams 12/28/2022, 11:06 AM

## 2022-12-29 DIAGNOSIS — J9601 Acute respiratory failure with hypoxia: Secondary | ICD-10-CM | POA: Diagnosis not present

## 2022-12-29 DIAGNOSIS — J189 Pneumonia, unspecified organism: Secondary | ICD-10-CM | POA: Diagnosis not present

## 2022-12-29 LAB — BASIC METABOLIC PANEL
Anion gap: 8 (ref 5–15)
BUN: 17 mg/dL (ref 6–20)
CO2: 27 mmol/L (ref 22–32)
Calcium: 9.8 mg/dL (ref 8.9–10.3)
Chloride: 99 mmol/L (ref 98–111)
Creatinine, Ser: 0.79 mg/dL (ref 0.44–1.00)
GFR, Estimated: >60 mL/min (ref >60)
Glucose, Bld: 95 mg/dL (ref 70–99)
Potassium: 4 mmol/L (ref 3.5–5.1)
Sodium: 134 mmol/L — ABNORMAL LOW (ref 135–145)

## 2022-12-29 LAB — GLUCOSE, CAPILLARY
Glucose-Capillary: 78 mg/dL (ref 70–99)
Glucose-Capillary: 69 mg/dL — ABNORMAL LOW (ref 70–99)
Glucose-Capillary: 122 mg/dL — ABNORMAL HIGH (ref 70–99)
Glucose-Capillary: 162 mg/dL — ABNORMAL HIGH (ref 70–99)
Glucose-Capillary: 144 mg/dL — ABNORMAL HIGH (ref 70–99)

## 2022-12-29 LAB — T4, FREE: Free T4: 1.22 ng/dL — ABNORMAL HIGH (ref 0.61–1.12)

## 2022-12-29 LAB — MAGNESIUM: Magnesium: 2.1 mg/dL (ref 1.7–2.4)

## 2022-12-29 LAB — CBC
HCT: 31.5 % — ABNORMAL LOW (ref 36.0–46.0)
Hemoglobin: 9.8 g/dL — ABNORMAL LOW (ref 12.0–15.0)
MCH: 26.4 pg (ref 26.0–34.0)
MCHC: 31.1 g/dL (ref 30.0–36.0)
MCV: 84.9 fL (ref 80.0–100.0)
Platelets: 253 10*3/uL (ref 150–400)
RBC: 3.71 MIL/uL — ABNORMAL LOW (ref 3.87–5.11)
RDW: 16.9 % — ABNORMAL HIGH (ref 11.5–15.5)
WBC: 17.8 10*3/uL — ABNORMAL HIGH (ref 4.0–10.5)
nRBC: 0 % (ref 0.0–0.2)

## 2022-12-29 LAB — TSH: TSH: 0.173 u[IU]/mL — ABNORMAL LOW (ref 0.350–4.500)

## 2022-12-29 MED ORDER — LEVOTHYROXINE SODIUM 25 MCG PO TABS
75.0000 ug | ORAL_TABLET | Freq: Every day | ORAL | Status: DC
  Administered 2022-12-30 – 2023-01-03 (×5): 75 ug via ORAL
  Filled 2022-12-29 (×5): qty 3

## 2022-12-29 MED ORDER — FUROSEMIDE 10 MG/ML IJ SOLN
40.0000 mg | Freq: Once | INTRAMUSCULAR | Status: AC
  Administered 2022-12-29: 40 mg via INTRAVENOUS
  Filled 2022-12-29: qty 4

## 2022-12-29 MED ORDER — SODIUM CHLORIDE 0.9 % IV SOLN: INTRAVENOUS | Status: DC | PRN

## 2022-12-29 MED ORDER — SODIUM CHLORIDE 0.9% FLUSH: 10.0000 mL | INTRAVENOUS | Status: DC | PRN

## 2022-12-29 MED ORDER — METOPROLOL TARTRATE 12.5 MG HALF TABLET
12.5000 mg | ORAL_TABLET | Freq: Two times a day (BID) | ORAL | Status: DC
  Administered 2022-12-29 – 2022-12-30 (×4): 12.5 mg via ORAL
  Filled 2022-12-29 (×4): qty 1
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
## 2022-12-30 DIAGNOSIS — J189 Pneumonia, unspecified organism: Secondary | ICD-10-CM | POA: Diagnosis not present

## 2022-12-30 DIAGNOSIS — A419 Sepsis, unspecified organism: Secondary | ICD-10-CM | POA: Diagnosis not present

## 2022-12-30 DIAGNOSIS — J9601 Acute respiratory failure with hypoxia: Secondary | ICD-10-CM | POA: Diagnosis not present

## 2022-12-30 LAB — CULTURE, BLOOD (ROUTINE X 2)
Culture: NO GROWTH
Culture: NO GROWTH
Special Requests: ADEQUATE
Special Requests: ADEQUATE

## 2022-12-30 LAB — CBC
HCT: 32.9 % — ABNORMAL LOW (ref 36.0–46.0)
Hemoglobin: 10.3 g/dL — ABNORMAL LOW (ref 12.0–15.0)
MCH: 26.5 pg (ref 26.0–34.0)
MCHC: 31.3 g/dL (ref 30.0–36.0)
MCV: 84.6 fL (ref 80.0–100.0)
Platelets: 163 10*3/uL (ref 150–400)
RBC: 3.89 MIL/uL (ref 3.87–5.11)
RDW: 16.9 % — ABNORMAL HIGH (ref 11.5–15.5)
WBC: 13.2 10*3/uL — ABNORMAL HIGH (ref 4.0–10.5)
nRBC: 0 % (ref 0.0–0.2)

## 2022-12-30 LAB — GLUCOSE, CAPILLARY
Glucose-Capillary: 81 mg/dL (ref 70–99)
Glucose-Capillary: 73 mg/dL (ref 70–99)
Glucose-Capillary: 200 mg/dL — ABNORMAL HIGH (ref 70–99)
Glucose-Capillary: 191 mg/dL — ABNORMAL HIGH (ref 70–99)

## 2022-12-30 LAB — BASIC METABOLIC PANEL
Anion gap: 10 (ref 5–15)
Anion gap: 11 (ref 5–15)
BUN: 19 mg/dL (ref 6–20)
BUN: 21 mg/dL — ABNORMAL HIGH (ref 6–20)
CO2: 31 mmol/L (ref 22–32)
CO2: 28 mmol/L (ref 22–32)
Calcium: 9.9 mg/dL (ref 8.9–10.3)
Calcium: 10.1 mg/dL (ref 8.9–10.3)
Chloride: 96 mmol/L — ABNORMAL LOW (ref 98–111)
Chloride: 96 mmol/L — ABNORMAL LOW (ref 98–111)
Creatinine, Ser: 0.81 mg/dL (ref 0.44–1.00)
Creatinine, Ser: 1.05 mg/dL — ABNORMAL HIGH (ref 0.44–1.00)
GFR, Estimated: >60 mL/min (ref >60)
GFR, Estimated: >60 mL/min (ref >60)
Glucose, Bld: 85 mg/dL (ref 70–99)
Glucose, Bld: 308 mg/dL — ABNORMAL HIGH (ref 70–99)
Potassium: 3.2 mmol/L — ABNORMAL LOW (ref 3.5–5.1)
Potassium: 3.9 mmol/L (ref 3.5–5.1)
Sodium: 137 mmol/L (ref 135–145)
Sodium: 135 mmol/L (ref 135–145)

## 2022-12-30 LAB — MAGNESIUM: Magnesium: 2.1 mg/dL (ref 1.7–2.4)

## 2022-12-30 MED ORDER — METHYLPREDNISOLONE SODIUM SUCC 40 MG IJ SOLR: 40.0000 mg | Freq: Once | INTRAMUSCULAR | Status: DC

## 2022-12-30 MED ORDER — METHYLPREDNISOLONE SODIUM SUCC 125 MG IJ SOLR: 50.0000 mg | Freq: Once | INTRAMUSCULAR | Status: DC

## 2022-12-30 MED ORDER — FUROSEMIDE 10 MG/ML IJ SOLN
40.0000 mg | Freq: Two times a day (BID) | INTRAMUSCULAR | Status: DC
  Administered 2022-12-30 – 2022-12-31 (×3): 40 mg via INTRAVENOUS
  Filled 2022-12-30 (×3): qty 4

## 2022-12-30 MED ORDER — METHYLPREDNISOLONE SODIUM SUCC 40 MG IJ SOLR: 20.0000 mg | Freq: Once | INTRAMUSCULAR | Status: DC

## 2022-12-30 MED ORDER — METHYLPREDNISOLONE SODIUM SUCC 40 MG IJ SOLR
40.0000 mg | Freq: Once | INTRAMUSCULAR | Status: AC
  Administered 2023-01-02: 40 mg via INTRAVENOUS
  Filled 2022-12-30: qty 1

## 2022-12-30 MED ORDER — METHYLPREDNISOLONE SODIUM SUCC 40 MG IJ SOLR: 10.0000 mg | Freq: Once | INTRAMUSCULAR | Status: DC

## 2022-12-30 MED ORDER — LACTATED RINGERS IV BOLUS
500.0000 mL | Freq: Once | INTRAVENOUS | Status: AC
  Administered 2022-12-30: 500 mL via INTRAVENOUS

## 2022-12-30 MED ORDER — METHYLPREDNISOLONE SODIUM SUCC 125 MG IJ SOLR: 70.0000 mg | Freq: Once | INTRAMUSCULAR | Status: DC

## 2022-12-30 MED ORDER — POTASSIUM CHLORIDE CRYS ER 20 MEQ PO TBCR
40.0000 meq | EXTENDED_RELEASE_TABLET | ORAL | Status: AC
  Administered 2022-12-30: 40 meq via ORAL
  Filled 2022-12-30: qty 2

## 2022-12-30 MED ORDER — POTASSIUM CHLORIDE CRYS ER 20 MEQ PO TBCR
40.0000 meq | EXTENDED_RELEASE_TABLET | Freq: Two times a day (BID) | ORAL | Status: DC
  Administered 2022-12-30: 40 meq via ORAL
  Filled 2022-12-30: qty 2

## 2022-12-30 MED ORDER — LEVALBUTEROL HCL 0.63 MG/3ML IN NEBU
0.6300 mg | INHALATION_SOLUTION | Freq: Four times a day (QID) | RESPIRATORY_TRACT | Status: DC | PRN
  Administered 2022-12-30: 0.63 mg via RESPIRATORY_TRACT
  Filled 2022-12-30: qty 3

## 2022-12-30 MED ORDER — METHYLPREDNISOLONE SODIUM SUCC 125 MG IJ SOLR
60.0000 mg | Freq: Once | INTRAMUSCULAR | Status: AC
  Administered 2022-12-31: 60 mg via INTRAVENOUS
  Filled 2022-12-30: qty 2

## 2022-12-30 MED ORDER — METHYLPREDNISOLONE SODIUM SUCC 125 MG IJ SOLR
50.0000 mg | Freq: Once | INTRAMUSCULAR | Status: AC
  Administered 2023-01-02: 50 mg via INTRAVENOUS
  Filled 2022-12-30: qty 2

## 2022-12-30 MED ORDER — METHYLPREDNISOLONE SODIUM SUCC 40 MG IJ SOLR
30.0000 mg | Freq: Once | INTRAMUSCULAR | Status: AC
  Administered 2023-01-04: 30 mg via INTRAVENOUS
  Filled 2022-12-30: qty 1

## 2022-12-30 MED ORDER — METHYLPREDNISOLONE SODIUM SUCC 40 MG IJ SOLR: 30.0000 mg | Freq: Once | INTRAMUSCULAR | Status: DC

## 2022-12-30 MED ORDER — METHYLPREDNISOLONE SODIUM SUCC 125 MG IJ SOLR
70.0000 mg | Freq: Once | INTRAMUSCULAR | Status: AC
  Administered 2022-12-30: 70 mg via INTRAVENOUS
  Filled 2022-12-30: qty 2

## 2022-12-30 MED ORDER — INSULIN ASPART 100 UNIT/ML IJ SOLN
0.0000 [IU] | Freq: Three times a day (TID) | INTRAMUSCULAR | Status: DC
  Administered 2022-12-30: 3 [IU] via SUBCUTANEOUS
  Administered 2022-12-31: 2 [IU] via SUBCUTANEOUS
  Administered 2022-12-31: 3 [IU] via SUBCUTANEOUS
  Administered 2023-01-01 – 2023-01-04 (×9): 2 [IU] via SUBCUTANEOUS

## 2022-12-30 MED ORDER — SODIUM CHLORIDE 0.9 % IV BOLUS
500.0000 mL | Freq: Once | INTRAVENOUS | Status: AC
  Administered 2022-12-30: 500 mL via INTRAVENOUS

## 2022-12-30 MED ORDER — METHYLPREDNISOLONE SODIUM SUCC 125 MG IJ SOLR: 60.0000 mg | Freq: Once | INTRAMUSCULAR | Status: DC

## 2022-12-30 NOTE — Progress Notes (Signed)
[facility] Physician-Brief Progress Note Patient Name: Amber Williams DOB: Oct 18, 1975 MRN: [id number]   Date of Service  12/30/2022  HPI/Events of Note  Pt with tachycardia to 140s.  She is net negative 6L since admission.   eICU Interventions  Will give fluid bolus and follow response.  Would also use xopenex so as not to further exacerbate tachycardia.         Amber Williams 12/30/2022, 9:09 PM
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
## 2022-12-30 NOTE — Progress Notes (Signed)
eLink Physician-Brief Progress Note Patient Name: NOVALEIGH MOCHEL DOB: 1976/03/17 MRN: [id number]   Date of Service  12/30/2022  HPI/Events of Note  Notified of hypotension with BP 82/55.  Pt has been diuresed with Lasix and had urine output of 3L today.   BP 82/55, HR 87, RR 22, O2 sats 95%  eICU Interventions  Give 500cc LR bolus.      Intervention Category Intermediate Interventions: Hypotension - evaluation and management  Larinda Buttery 12/30/2022, 1:39 AM

## 2022-12-31 ENCOUNTER — Inpatient Hospital Stay (HOSPITAL_COMMUNITY): Payer: Medicare HMO

## 2022-12-31 DIAGNOSIS — J9601 Acute respiratory failure with hypoxia: Secondary | ICD-10-CM | POA: Diagnosis not present

## 2022-12-31 DIAGNOSIS — J189 Pneumonia, unspecified organism: Secondary | ICD-10-CM | POA: Diagnosis not present

## 2022-12-31 LAB — GLUCOSE, CAPILLARY
Glucose-Capillary: 177 mg/dL — ABNORMAL HIGH (ref 70–99)
Glucose-Capillary: 122 mg/dL — ABNORMAL HIGH (ref 70–99)
Glucose-Capillary: 103 mg/dL — ABNORMAL HIGH (ref 70–99)
Glucose-Capillary: 95 mg/dL (ref 70–99)

## 2022-12-31 LAB — CBC
HCT: 33.5 % — ABNORMAL LOW (ref 36.0–46.0)
Hemoglobin: 10.2 g/dL — ABNORMAL LOW (ref 12.0–15.0)
MCH: 26 pg (ref 26.0–34.0)
MCHC: 30.4 g/dL (ref 30.0–36.0)
MCV: 85.2 fL (ref 80.0–100.0)
Platelets: 132 10*3/uL — ABNORMAL LOW (ref 150–400)
RBC: 3.93 MIL/uL (ref 3.87–5.11)
RDW: 16.5 % — ABNORMAL HIGH (ref 11.5–15.5)
WBC: 17.4 10*3/uL — ABNORMAL HIGH (ref 4.0–10.5)
nRBC: 0 % (ref 0.0–0.2)

## 2022-12-31 LAB — BASIC METABOLIC PANEL
Anion gap: 11 (ref 5–15)
BUN: 20 mg/dL (ref 6–20)
CO2: 32 mmol/L (ref 22–32)
Calcium: 10.1 mg/dL (ref 8.9–10.3)
Chloride: 93 mmol/L — ABNORMAL LOW (ref 98–111)
Creatinine, Ser: 0.79 mg/dL (ref 0.44–1.00)
GFR, Estimated: >60 mL/min (ref >60)
Glucose, Bld: 138 mg/dL — ABNORMAL HIGH (ref 70–99)
Potassium: 4 mmol/L (ref 3.5–5.1)
Sodium: 136 mmol/L (ref 135–145)

## 2022-12-31 MED ORDER — METOPROLOL TARTRATE 25 MG PO TABS
25.0000 mg | ORAL_TABLET | Freq: Two times a day (BID) | ORAL | Status: DC
  Administered 2022-12-31 – 2023-01-01 (×3): 25 mg via ORAL
  Filled 2022-12-31 (×5): qty 1

## 2022-12-31 MED ORDER — LACTATED RINGERS IV SOLN: INTRAVENOUS | Status: DC

## 2022-12-31 MED ORDER — FUROSEMIDE 10 MG/ML IJ SOLN: 40.0000 mg | Freq: Every day | INTRAMUSCULAR | Status: DC

## 2022-12-31 MED ORDER — DEXTROMETHORPHAN POLISTIREX ER 30 MG/5ML PO SUER
30.0000 mg | Freq: Two times a day (BID) | ORAL | Status: DC
  Administered 2022-12-31 – 2023-01-03 (×6): 30 mg via ORAL
  Filled 2022-12-31 (×10): qty 5
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
## 2022-12-31 NOTE — Progress Notes (Signed)
Pt taken off HHFNC and transitioned to BIPAP. Pt anxious and still coughing intermittently but overall tolerating BIPAP well, maintaining saturations of 94%<.

## 2022-12-31 NOTE — Progress Notes (Signed)
Upon RT assessment pt having one of her coughing fits that have been occurring over the past few days. NRB placed on by day shift nurse to maintain 93%<. RT trialed pt off the NRB, coughing spells continued with desaturation so NRB was reapplied with HHFNC, now increased to 40L.  RT discussed the BIPAP with pt when coughing subsides but decided against because of the anxiety it causes for the pt. Oxygen saturations currently 91%<.  RT will continue to monitor pt and wean Fio2 as saturations allow.

## 2022-12-31 NOTE — Final Progress Note (Signed)
Patient coughing without end. HHFNC maxed out, unable to bring 02 sat to 88 iwht HHFNC. RT Tracey called. Nonrebreather 15l 02 applied, sats maintaining 93%<. WCTM

## 2022-12-31 NOTE — Progress Notes (Addendum)
eLink Physician-Brief Progress Note Patient Name: Amber Williams DOB: 29-Mar-1976 MRN: [id number]   Date of Service  12/31/2022  HPI/Events of Note  Patient with coughing associated with desaturations to upper 80s on HHFNC 100% 40L  Awake, alert. Has anxiety about using BiPAP  eICU Interventions  Counseled on benefit of BiPAP use.  Ok to give tussionex and ativan now (2 hours earlier than scheduled)  CXR ordered   9:30 PM CXR with worsening pneumonia which is consistent with her O2 requirement. Currently on BiPAP and appear comfortable with intermittent coughing. Recently completed 7 days of Augmentin today with unchanged WBC. If she further decompensates, may need to restart and broaden antibiotics. Remains afebrile but in sinus tachycardia to 140s. Start gentle IV hydration overnight.  01/01/23 3:20 AM. Improved tachycardia to 114. Tolerating BiPAP. Poor UOP with SBP 90-100s. Give additional 500 cc bolus now  Intervention Category Intermediate Interventions: Respiratory distress - evaluation and management  Allayah Raineri Mechele Collin 12/31/2022, 7:53 PM

## 2022-12-31 NOTE — Plan of Care (Signed)
Pt progressing

## 2022-12-31 DEATH — deceased

## 2023-01-01 DIAGNOSIS — J9601 Acute respiratory failure with hypoxia: Secondary | ICD-10-CM | POA: Diagnosis not present

## 2023-01-01 DIAGNOSIS — J189 Pneumonia, unspecified organism: Secondary | ICD-10-CM | POA: Diagnosis not present

## 2023-01-01 LAB — CBC
HCT: 34.4 % — ABNORMAL LOW (ref 36.0–46.0)
HCT: 35 % — ABNORMAL LOW (ref 36.0–46.0)
HCT: 33.4 % — ABNORMAL LOW (ref 36.0–46.0)
Hemoglobin: 10.7 g/dL — ABNORMAL LOW (ref 12.0–15.0)
Hemoglobin: 10.9 g/dL — ABNORMAL LOW (ref 12.0–15.0)
Hemoglobin: 10.8 g/dL — ABNORMAL LOW (ref 12.0–15.0)
MCH: 26.4 pg (ref 26.0–34.0)
MCH: 27.1 pg (ref 26.0–34.0)
MCH: 27.3 pg (ref 26.0–34.0)
MCHC: 31.1 g/dL (ref 30.0–36.0)
MCHC: 31.1 g/dL (ref 30.0–36.0)
MCHC: 32.3 g/dL (ref 30.0–36.0)
MCV: 84.7 fL (ref 80.0–100.0)
MCV: 87.1 fL (ref 80.0–100.0)
MCV: 84.6 fL (ref 80.0–100.0)
Platelets: 70 10*3/uL — ABNORMAL LOW (ref 150–400)
Platelets: 58 10*3/uL — ABNORMAL LOW (ref 150–400)
Platelets: 54 10*3/uL — ABNORMAL LOW (ref 150–400)
RBC: 4.06 MIL/uL (ref 3.87–5.11)
RBC: 4.02 MIL/uL (ref 3.87–5.11)
RBC: 3.95 MIL/uL (ref 3.87–5.11)
RDW: 16.6 % — ABNORMAL HIGH (ref 11.5–15.5)
RDW: 16.5 % — ABNORMAL HIGH (ref 11.5–15.5)
RDW: 16.4 % — ABNORMAL HIGH (ref 11.5–15.5)
WBC: 22.2 10*3/uL — ABNORMAL HIGH (ref 4.0–10.5)
WBC: 15.4 10*3/uL — ABNORMAL HIGH (ref 4.0–10.5)
WBC: 20.9 10*3/uL — ABNORMAL HIGH (ref 4.0–10.5)
nRBC: 0 % (ref 0.0–0.2)
nRBC: 0 % (ref 0.0–0.2)
nRBC: 0.2 % (ref 0.0–0.2)

## 2023-01-01 LAB — GLUCOSE, CAPILLARY
Glucose-Capillary: 132 mg/dL — ABNORMAL HIGH (ref 70–99)
Glucose-Capillary: 122 mg/dL — ABNORMAL HIGH (ref 70–99)
Glucose-Capillary: 133 mg/dL — ABNORMAL HIGH (ref 70–99)
Glucose-Capillary: 89 mg/dL (ref 70–99)

## 2023-01-01 LAB — DIC (DISSEMINATED INTRAVASCULAR COAGULATION)PANEL
D-Dimer, Quant: >20 ug{FEU}/mL — ABNORMAL HIGH (ref 0.00–0.50)
Fibrinogen: 412 mg/dL (ref 210–475)
INR: 2.3 — ABNORMAL HIGH (ref 0.8–1.2)
Platelets: 59 10*3/uL — ABNORMAL LOW (ref 150–400)
Prothrombin Time: 25.1 s — ABNORMAL HIGH (ref 11.4–15.2)
Smear Review: NONE SEEN
aPTT: 30 s (ref 24–36)

## 2023-01-01 LAB — BASIC METABOLIC PANEL
Anion gap: 6 (ref 5–15)
BUN: 25 mg/dL — ABNORMAL HIGH (ref 6–20)
CO2: 34 mmol/L — ABNORMAL HIGH (ref 22–32)
Calcium: 9.9 mg/dL (ref 8.9–10.3)
Chloride: 93 mmol/L — ABNORMAL LOW (ref 98–111)
Creatinine, Ser: 0.88 mg/dL (ref 0.44–1.00)
GFR, Estimated: >60 mL/min (ref >60)
Glucose, Bld: 104 mg/dL — ABNORMAL HIGH (ref 70–99)
Potassium: 4.2 mmol/L (ref 3.5–5.1)
Sodium: 133 mmol/L — ABNORMAL LOW (ref 135–145)

## 2023-01-01 MED ORDER — LACTATED RINGERS IV BOLUS
500.0000 mL | Freq: Once | INTRAVENOUS | Status: AC
  Administered 2023-01-01: 500 mL via INTRAVENOUS

## 2023-01-01 MED ORDER — ALTEPLASE 2 MG IJ SOLR
2.0000 mg | Freq: Once | INTRAMUSCULAR | Status: AC
  Administered 2023-01-01: 2 mg
  Filled 2023-01-01: qty 2

## 2023-01-01 NOTE — Progress Notes (Signed)
RN spoke with Elink and on call provider (Chi Everardo All) about patient anxiety, her sustained HR-150s. MD ordered to give tussionex and ativan and place patient on BiPAP.

## 2023-01-01 NOTE — Evaluation (Signed)
Physical Therapy Evaluation Patient Details Name: Amber Williams MRN: [id number] DOB: 20-Jun-1975 Today's Date: 01/01/2023  History of Present Illness  47 yo female admitted 8/26 with SOB, acute on chronic respiratory failure with hypoxia. PMHx: Recent admissions at [facility] 8/10-8/15 and 8/17-8/24/24 with Covid PNA with MS flare. MS, DVT, hypothyroidism, PTSD, neurogenic bladder, HTN, HLD, GERD, gastric bypass, anal CA, Rt foot drop  Clinical Impression  PT pleasant and reports desire to return home with boyfriend. Pt normally walks limited distance has significant DME at home and has boyfriend to assist. Pt reports that she was initially supposed to be sent home with O2 from [facility] but that it wasn't sent then didn't work and her activity tolerance has been limited by SOB since 8/10 admission. Pt eager to regain function to return home but limited by weakness and pulmonary deficit only able to pivot to chair this session. Pt will benefit from acute therapy to maximize mobility, safety and independence to decrease burden of care. Patient will benefit from intensive inpatient follow up therapy, >3 hours/day   SpO2 65% on 30L on arrival with SPO2 92% at rest, drop to 85% with transition to EOb and increased time to recover. Transition to pivot pt desaturated to 78% with FiO2 increased to 80% for recover and ultimately back to FIO2 65% at rest with return to 90% SPO2  Supine 83/56 (64), HR 102 Sitting 111/78 (90)     If plan is discharge home, recommend the following: A lot of help with walking and/or transfers;A little help with bathing/dressing/bathroom;Assistance with cooking/housework;Assist for transportation;Help with stairs or ramp for entrance   Can travel by private vehicle        Equipment Recommendations None recommended by PT  Recommendations for Other Services  OT consult;Rehab consult    Functional Status Assessment Patient has had a recent decline in their functional status and  demonstrates the ability to make significant improvements in function in a reasonable and predictable amount of time.     Precautions / Restrictions Precautions Precautions: Fall;Other (comment) Precaution Comments: watch sats, HHFNC      Mobility  Bed Mobility Overal bed mobility: Needs Assistance Bed Mobility: Supine to Sit     Supine to sit: HOB elevated, Supervision     General bed mobility comments: HOB 30 degrees with increased time and UB assist to move legs to EOB    Transfers Overall transfer level: Needs assistance   Transfers: Sit to/from Stand, Bed to chair/wheelchair/BSC Sit to Stand: Min assist, +2 safety/equipment Stand pivot transfers: Min assist, +2 safety/equipment         General transfer comment: min +2 to stand from bed, pt with initial buckling and return to sitting. Left knee then blocked by therapist with pt able to rise with assist of RW and pivot to chair. Desaturation to 78% sPO2 on 65% FiO2 at 30L with increase to 80% for recovery to 88% after 3 min then returned to FIO2 65% at 90% SPO2 end of session    Ambulation/Gait               General Gait Details: not yet able  Stairs            Wheelchair Mobility     Tilt Bed    Modified Rankin (Stroke Patients Only)       Balance Overall balance assessment: Needs assistance Sitting-balance support: No upper extremity supported, Feet supported Sitting balance-Leahy Scale: Fair     Standing balance support: Bilateral upper  extremity supported, Reliant on assistive device for balance Standing balance-Leahy Scale: Poor Standing balance comment: Rw in standing with assist for left knee buckling                             Pertinent Vitals/Pain Pain Assessment Pain Assessment: 0-10 Pain Score: 3  Pain Location: legs Pain Descriptors / Indicators: Aching Pain Intervention(s): Limited activity within patient's tolerance, Repositioned, Monitored during session     Home Living Family/patient expects to be discharged to:: Private residence Living Arrangements: Spouse/significant other Available Help at Discharge: Family;Available 24 hours/day Type of Home: House Home Access: Ramped entrance       Home Layout: One level Home Equipment: Educational psychologist (2 wheels);Wheelchair - manual;Cane - single point;Shower seat;BSC/3in1      Prior Function Prior Level of Function : Needs assist       Physical Assist : ADLs (physical)   ADLs (physical): IADLs Mobility Comments: pt reports being able to walk limited household distance with RW and get to [facility] when fatigued, history of falls <10 in last year ADLs Comments: pt states she performed ADLs without assist majority of the time but boyfriend would assist as needed and he does the IADLs     Extremity/Trunk Assessment   Upper Extremity Assessment Upper Extremity Assessment: Generalized weakness    Lower Extremity Assessment Lower Extremity Assessment: RLE deficits/detail;LLE deficits/detail RLE Deficits / Details: grossly 2/5 for bed mobility with pt using UB to move legs to EOB, instanding initials partial buckle then able to maintain weight to stand with reliance of UB on RW LLE Deficits / Details: grossly 2/5 for bed mobility with pt using UB to move legs to EOB, instanding initials partial buckle then able to maintain weight to stand with reliance of UB on RW    Cervical / Trunk Assessment Cervical / Trunk Assessment: Other exceptions Cervical / Trunk Exceptions: rounded shoulders  Communication   Communication Communication: No apparent difficulties  Cognition Arousal: Alert Behavior During Therapy: WFL for tasks assessed/performed Overall Cognitive Status: Within Functional Limits for tasks assessed                                          General Comments      Exercises     Assessment/Plan    PT Assessment Patient needs continued PT services  PT  Problem List Decreased strength;Decreased mobility;Decreased activity tolerance;Decreased balance;Decreased knowledge of use of DME;Cardiopulmonary status limiting activity       PT Treatment Interventions DME instruction;Therapeutic exercise;Gait training;Functional mobility training;Therapeutic activities;Patient/family education;Neuromuscular re-education;Balance training    PT Goals (Current goals can be found in the Care Plan section)  Acute Rehab PT Goals Patient Stated Goal: return home PT Goal Formulation: With patient Time For Goal Achievement: 01/15/23 Potential to Achieve Goals: Fair    Frequency Min 1X/week     Co-evaluation               AM-PAC PT "6 Clicks" Mobility  Outcome Measure Help needed turning from your back to your side while in a flat bed without using bedrails?: A Little Help needed moving from lying on your back to sitting on the side of a flat bed without using bedrails?: A Little Help needed moving to and from a bed to a chair (including a wheelchair)?: A Lot Help needed standing up  from a chair using your arms (e.g., wheelchair or bedside chair)?: A Lot Help needed to walk in hospital room?: Total Help needed climbing 3-5 steps with a railing? : Total 6 Click Score: 12    End of Session Equipment Utilized During Treatment: Gait belt Activity Tolerance: Other (comment) (limited by cardiopulmonary endurance) Patient left: in chair;with call bell/phone within reach;with chair alarm set Nurse Communication: Mobility status PT Visit Diagnosis: Other abnormalities of gait and mobility (R26.89);Muscle weakness (generalized) (M62.81);History of falling (Z91.81)    Time: 8657-8469 PT Time Calculation (min) (ACUTE ONLY): 23 min   Charges:   PT Evaluation $PT Eval Moderate Complexity: 1 Mod PT Treatments $Therapeutic Activity: 8-22 mins PT General Charges $$ ACUTE PT VISIT: 1 Visit         Merryl Hacker, PT Acute Rehabilitation Services Office:  [phone]   Enedina Finner Allannah Kempen 01/01/2023, 10:22 AM
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
## 2023-01-01 NOTE — Progress Notes (Signed)
eLink Physician-Brief Progress Note Patient Name: Amber Williams DOB: 1975-07-14 MRN: [id number]   Date of Service  01/01/2023  HPI/Events of Note  48 year old female with hypertension, hyperlipidemia, hypothyroidism and multiple sclerosis who was recently admitted at [facility] for COVID-pneumonia, was discharged few days ago came to [facility] hospital with increasing shortness of breath and cough, she was noted to be hypoxic, currently on high flow nasal cannula oxygen at 80% FiO2 and 30L flow.  Patient is scheduled for MRI brain tonight due to left twitching and reduced movement of the right foot.  Unfortunately, the patient is on too much oxygen to be able to transferred safely to MRI.  Patient will need to be on a nonrebreather and currently exceeds his requirements  eICU Interventions  Holding MRI until respiratory status is improved or patient is a secure invasive airway.  Patient has been more tachypneic, tachycardic, and hypoxic since earlier in the day.  Increase high flow nasal cannula flow rate to 50 L.       Intervention Category Minor Interventions: Clinical assessment - ordering diagnostic tests  Milena Liggett 01/01/2023, 8:22 PM

## 2023-01-01 NOTE — Plan of Care (Signed)
?  Problem: Clinical Measurements: ?Goal: Ability to maintain a body temperature in the normal range will improve ?Outcome: Progressing ?  ?Problem: Respiratory: ?Goal: Ability to maintain adequate ventilation will improve ?Outcome: Progressing ?  ?

## 2023-01-01 NOTE — Progress Notes (Signed)
Inpatient Rehab Admissions Coordinator:   Per therapy recommendations,  patient was screened for CIR candidacy by Megan Salon, MS, CCC-SLP  At this time, Pt. is not medically ready for CIR, as she continues to require 30L via HHFNC.. I will not pursue a rehab consult for this Pt. at this time, but CIR admissions team will follow and monitor for medical readiness and place consult order if Pt. appears to be an appropriate candidate. Please contact me with any questions.   Megan Salon, MS, CCC-SLP Rehab Admissions Coordinator  [phone] (celll) [phone] (office)

## 2023-01-01 NOTE — Progress Notes (Signed)
Pt taken off BIPAP and placed back on HHFNC to take PO meds after wearing BIPAP all night. Pt in no respiratory distress at this time maintaining 94%< on 30L 80%.

## 2023-01-02 DIAGNOSIS — J189 Pneumonia, unspecified organism: Secondary | ICD-10-CM | POA: Diagnosis not present

## 2023-01-02 LAB — CBC
HCT: 31.1 % — ABNORMAL LOW (ref 36.0–46.0)
HCT: 31.3 % — ABNORMAL LOW (ref 36.0–46.0)
HCT: 35.3 % — ABNORMAL LOW (ref 36.0–46.0)
Hemoglobin: 10.8 g/dL — ABNORMAL LOW (ref 12.0–15.0)
Hemoglobin: 9.7 g/dL — ABNORMAL LOW (ref 12.0–15.0)
Hemoglobin: 9.8 g/dL — ABNORMAL LOW (ref 12.0–15.0)
MCH: 26.3 pg (ref 26.0–34.0)
MCH: 26.3 pg (ref 26.0–34.0)
MCH: 26.3 pg (ref 26.0–34.0)
MCHC: 30.6 g/dL (ref 30.0–36.0)
MCHC: 31.2 g/dL (ref 30.0–36.0)
MCHC: 31.3 g/dL (ref 30.0–36.0)
MCV: 83.9 fL (ref 80.0–100.0)
MCV: 84.3 fL (ref 80.0–100.0)
MCV: 85.9 fL (ref 80.0–100.0)
Platelets: 47 10*3/uL — ABNORMAL LOW (ref 150–400)
Platelets: 50 10*3/uL — ABNORMAL LOW (ref 150–400)
Platelets: 51 10*3/uL — ABNORMAL LOW (ref 150–400)
RBC: 3.69 MIL/uL — ABNORMAL LOW (ref 3.87–5.11)
RBC: 3.73 MIL/uL — ABNORMAL LOW (ref 3.87–5.11)
RBC: 4.11 MIL/uL (ref 3.87–5.11)
RDW: 16.3 % — ABNORMAL HIGH (ref 11.5–15.5)
RDW: 16.5 % — ABNORMAL HIGH (ref 11.5–15.5)
RDW: 16.5 % — ABNORMAL HIGH (ref 11.5–15.5)
WBC: 12.6 10*3/uL — ABNORMAL HIGH (ref 4.0–10.5)
WBC: 14.2 10*3/uL — ABNORMAL HIGH (ref 4.0–10.5)
WBC: 17.7 10*3/uL — ABNORMAL HIGH (ref 4.0–10.5)
nRBC: 0 % (ref 0.0–0.2)
nRBC: 0 % (ref 0.0–0.2)
nRBC: 0 % (ref 0.0–0.2)

## 2023-01-02 LAB — GLUCOSE, CAPILLARY
Glucose-Capillary: 133 mg/dL — ABNORMAL HIGH (ref 70–99)
Glucose-Capillary: 146 mg/dL — ABNORMAL HIGH (ref 70–99)
Glucose-Capillary: 131 mg/dL — ABNORMAL HIGH (ref 70–99)
Glucose-Capillary: 144 mg/dL — ABNORMAL HIGH (ref 70–99)
Glucose-Capillary: 93 mg/dL (ref 70–99)

## 2023-01-02 LAB — BASIC METABOLIC PANEL
Anion gap: 8 (ref 5–15)
BUN: 20 mg/dL (ref 6–20)
CO2: 30 mmol/L (ref 22–32)
Calcium: 10 mg/dL (ref 8.9–10.3)
Chloride: 95 mmol/L — ABNORMAL LOW (ref 98–111)
Creatinine, Ser: 0.63 mg/dL (ref 0.44–1.00)
GFR, Estimated: >60 mL/min (ref >60)
Glucose, Bld: 141 mg/dL — ABNORMAL HIGH (ref 70–99)
Potassium: 4.2 mmol/L (ref 3.5–5.1)
Sodium: 133 mmol/L — ABNORMAL LOW (ref 135–145)

## 2023-01-02 MED ORDER — LORAZEPAM 0.5 MG PO TABS: 0.5000 mg | ORAL_TABLET | Freq: Once | ORAL | Status: DC | PRN

## 2023-01-02 MED ORDER — LORAZEPAM 2 MG/ML IJ SOLN
0.5000 mg | Freq: Four times a day (QID) | INTRAMUSCULAR | Status: DC | PRN
  Administered 2023-01-03 (×3): 0.5 mg via INTRAVENOUS
  Filled 2023-01-02 (×3): qty 1

## 2023-01-02 MED ORDER — ORAL CARE MOUTH RINSE: 15.0000 mL | OROMUCOSAL | Status: DC | PRN

## 2023-01-02 MED ORDER — ORAL CARE MOUTH RINSE
15.0000 mL | OROMUCOSAL | Status: DC
  Administered 2023-01-02 – 2023-01-08 (×20): 15 mL via OROMUCOSAL

## 2023-01-02 MED ORDER — SENNOSIDES-DOCUSATE SODIUM 8.6-50 MG PO TABS
1.0000 | ORAL_TABLET | Freq: Two times a day (BID) | ORAL | Status: DC
  Administered 2023-01-02 – 2023-01-03 (×3): 1 via ORAL
  Filled 2023-01-02 (×4): qty 1

## 2023-01-02 MED ORDER — POLYETHYLENE GLYCOL 3350 17 G PO PACK
17.0000 g | PACK | Freq: Every day | ORAL | Status: DC
  Administered 2023-01-02 – 2023-01-03 (×2): 17 g via ORAL
  Filled 2023-01-02 (×3): qty 1

## 2023-01-02 MED ORDER — METOPROLOL TARTRATE 12.5 MG HALF TABLET
12.5000 mg | ORAL_TABLET | Freq: Two times a day (BID) | ORAL | Status: DC
  Administered 2023-01-02 – 2023-01-03 (×3): 12.5 mg via ORAL
  Filled 2023-01-02 (×4): qty 1

## 2023-01-02 MED ORDER — LORAZEPAM 2 MG/ML IJ SOLN: 0.5000 mg | Freq: Once | INTRAMUSCULAR | Status: DC | PRN

## 2023-01-02 MED ORDER — FUROSEMIDE 40 MG PO TABS
20.0000 mg | ORAL_TABLET | Freq: Every day | ORAL | Status: DC
  Administered 2023-01-02 – 2023-01-03 (×2): 20 mg via ORAL
  Filled 2023-01-02 (×3): qty 1

## 2023-01-02 MED ORDER — METOPROLOL TARTRATE 12.5 MG HALF TABLET: 12.5000 mg | ORAL_TABLET | Freq: Two times a day (BID) | ORAL | Status: DC

## 2023-01-02 NOTE — Progress Notes (Addendum)
eLink Physician-Brief Progress Note Patient Name: Amber Williams DOB: 12-Feb-1976 MRN: [id number]   Date of Service  01/02/2023  HPI/Events of Note  Patient with known severe anxiety requiring Ativan.  Planning on undergoing MRI for better evaluation of her suspected MS.  Ativan orders were discontinued.  eICU Interventions  Per CCM note, resume Ativan as needed for anxiety    [phone] -bladder scan 546.  Patient had reduced urinary output over the past 24 hours.  History of frequent UTIs.  For now, maintain in/out cath for next 24 hours.  No indication for urinalysis.   Intervention Category Minor Interventions: Agitation / anxiety - evaluation and management  Raylynn Hersh 01/02/2023, 8:03 PM

## 2023-01-02 NOTE — Plan of Care (Signed)
Pt alert and oriented x4, remains on HFNC to maintain sats >90%. Pt afebrile, po intake poor. Pt given Ativan prior to sleep for anxiety with relief, slept well. Meds and labs as ordered, will continue to monitor.

## 2023-01-02 NOTE — Progress Notes (Signed)
OT Cancellation Note  Patient Details Name: Amber Williams MRN: [id number] DOB: 1975/07/09   Cancelled Treatment:    Reason Eval/Treat Not Completed: Patient not medically ready (Pt wtih 90% FiO2 35L and satting 86% with HR at 126 at rest. OT to cancel for today and evaluate once she can tolerate)  Donia Pounds 01/02/2023, 8:38 AM

## 2023-01-02 NOTE — Plan of Care (Signed)
  Problem: Activity: Goal: Ability to tolerate increased activity will improve Outcome: Progressing   Problem: Clinical Measurements: Goal: Ability to maintain a body temperature in the normal range will improve Outcome: Progressing   Problem: Respiratory: Goal: Ability to maintain adequate ventilation will improve Outcome: Progressing Goal: Ability to maintain a clear airway will improve Outcome: Progressing   Problem: Education: Goal: Knowledge of General Education information will improve Description: Including pain rating scale, medication(s)/side effects and non-pharmacologic comfort measures Outcome: Progressing   Problem: Health Behavior/Discharge Planning: Goal: Ability to manage health-related needs will improve Outcome: Progressing   Problem: Clinical Measurements: Goal: Ability to maintain clinical measurements within normal limits will improve Outcome: Progressing Goal: Will remain free from infection Outcome: Progressing Goal: Diagnostic test results will improve Outcome: Progressing Goal: Respiratory complications will improve Outcome: Progressing Goal: Cardiovascular complication will be avoided Outcome: Progressing   Problem: Activity: Goal: Risk for activity intolerance will decrease Outcome: Progressing   Problem: Nutrition: Goal: Adequate nutrition will be maintained Outcome: Progressing   Problem: Coping: Goal: Level of anxiety will decrease Outcome: Progressing   Problem: Elimination: Goal: Will not experience complications related to bowel motility Outcome: Progressing Goal: Will not experience complications related to urinary retention Outcome: Progressing   Problem: Pain Managment: Goal: General experience of comfort will improve Outcome: Progressing   Problem: Safety: Goal: Ability to remain free from injury will improve Outcome: Progressing   Problem: Skin Integrity: Goal: Risk for impaired skin integrity will decrease Outcome:  Progressing   Problem: Education: Goal: Ability to describe self-care measures that may prevent or decrease complications (Diabetes Survival Skills Education) will improve Outcome: Progressing Goal: Individualized Educational Video(s) Outcome: Progressing   Problem: Coping: Goal: Ability to adjust to condition or change in health will improve Outcome: Progressing   Problem: Fluid Volume: Goal: Ability to maintain a balanced intake and output will improve Outcome: Progressing   Problem: Health Behavior/Discharge Planning: Goal: Ability to identify and utilize available resources and services will improve Outcome: Progressing Goal: Ability to manage health-related needs will improve Outcome: Progressing   Problem: Metabolic: Goal: Ability to maintain appropriate glucose levels will improve Outcome: Progressing   Problem: Nutritional: Goal: Maintenance of adequate nutrition will improve Outcome: Progressing Goal: Progress toward achieving an optimal weight will improve Outcome: Progressing   Problem: Skin Integrity: Goal: Risk for impaired skin integrity will decrease Outcome: Progressing   Problem: Tissue Perfusion: Goal: Adequacy of tissue perfusion will improve Outcome: Progressing   
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
## 2023-01-03 ENCOUNTER — Encounter (HOSPITAL_COMMUNITY): Payer: Self-pay

## 2023-01-03 ENCOUNTER — Inpatient Hospital Stay (HOSPITAL_COMMUNITY): Payer: Medicare HMO

## 2023-01-03 DIAGNOSIS — J9601 Acute respiratory failure with hypoxia: Secondary | ICD-10-CM | POA: Diagnosis not present

## 2023-01-03 LAB — GLUCOSE, CAPILLARY
Glucose-Capillary: 123 mg/dL — ABNORMAL HIGH (ref 70–99)
Glucose-Capillary: 125 mg/dL — ABNORMAL HIGH (ref 70–99)
Glucose-Capillary: 78 mg/dL (ref 70–99)
Glucose-Capillary: 85 mg/dL (ref 70–99)

## 2023-01-03 LAB — CBC
HCT: 32.1 % — ABNORMAL LOW (ref 36.0–46.0)
Hemoglobin: 9.9 g/dL — ABNORMAL LOW (ref 12.0–15.0)
MCH: 25.8 pg — ABNORMAL LOW (ref 26.0–34.0)
MCHC: 30.8 g/dL (ref 30.0–36.0)
MCV: 83.6 fL (ref 80.0–100.0)
Platelets: 66 10*3/uL — ABNORMAL LOW (ref 150–400)
RBC: 3.84 MIL/uL — ABNORMAL LOW (ref 3.87–5.11)
RDW: 16.5 % — ABNORMAL HIGH (ref 11.5–15.5)
WBC: 18.5 10*3/uL — ABNORMAL HIGH (ref 4.0–10.5)
nRBC: 0 % (ref 0.0–0.2)

## 2023-01-03 LAB — BASIC METABOLIC PANEL
Anion gap: 7 (ref 5–15)
BUN: 22 mg/dL — ABNORMAL HIGH (ref 6–20)
CO2: 32 mmol/L (ref 22–32)
Calcium: 9.7 mg/dL (ref 8.9–10.3)
Chloride: 94 mmol/L — ABNORMAL LOW (ref 98–111)
Creatinine, Ser: 0.67 mg/dL (ref 0.44–1.00)
GFR, Estimated: >60 mL/min (ref >60)
Glucose, Bld: 118 mg/dL — ABNORMAL HIGH (ref 70–99)
Potassium: 4.3 mmol/L (ref 3.5–5.1)
Sodium: 133 mmol/L — ABNORMAL LOW (ref 135–145)

## 2023-01-03 LAB — URINALYSIS, ROUTINE W REFLEX MICROSCOPIC
Bilirubin Urine: NEGATIVE
Glucose, UA: NEGATIVE mg/dL
Ketones, ur: NEGATIVE mg/dL
Leukocytes,Ua: NEGATIVE
Nitrite: NEGATIVE
Protein, ur: 30 mg/dL — AB
Specific Gravity, Urine: 1.017 (ref 1.005–1.030)
pH: 5 (ref 5.0–8.0)

## 2023-01-03 MED ORDER — NOREPINEPHRINE 4 MG/250ML-% IV SOLN
0.0000 ug/min | INTRAVENOUS | Status: DC
  Administered 2023-01-03: 10 ug/min via INTRAVENOUS
  Administered 2023-01-05: 1 ug/min via INTRAVENOUS
  Administered 2023-01-08: 14 ug/min via INTRAVENOUS
  Administered 2023-01-08: 8 ug/min via INTRAVENOUS
  Administered 2023-01-09: 2 ug/min via INTRAVENOUS
  Administered 2023-01-09 (×2): 9 ug/min via INTRAVENOUS
  Filled 2023-01-03 (×6): qty 250

## 2023-01-03 MED ORDER — DEXMEDETOMIDINE HCL IN NACL 400 MCG/100ML IV SOLN
0.0000 ug/kg/h | INTRAVENOUS | Status: DC
  Administered 2023-01-03: 0.4 ug/kg/h via INTRAVENOUS
  Administered 2023-01-04: 0.5 ug/kg/h via INTRAVENOUS
  Administered 2023-01-05: 0.6 ug/kg/h via INTRAVENOUS
  Administered 2023-01-06: 0.4 ug/kg/h via INTRAVENOUS
  Administered 2023-01-06: 0.5 ug/kg/h via INTRAVENOUS
  Administered 2023-01-07: 1 ug/kg/h via INTRAVENOUS
  Administered 2023-01-07: 0.7 ug/kg/h via INTRAVENOUS
  Administered 2023-01-07: 0.9 ug/kg/h via INTRAVENOUS
  Administered 2023-01-07: 0.5 ug/kg/h via INTRAVENOUS
  Filled 2023-01-03 (×9): qty 100

## 2023-01-03 MED ORDER — SALINE SPRAY 0.65 % NA SOLN
1.0000 | NASAL | Status: DC | PRN
  Administered 2023-01-05 (×2): 1 via NASAL
  Filled 2023-01-03 (×2): qty 44

## 2023-01-03 MED ORDER — ACETAMINOPHEN 10 MG/ML IV SOLN
1000.0000 mg | Freq: Once | INTRAVENOUS | Status: AC
  Administered 2023-01-03: 1000 mg via INTRAVENOUS
  Filled 2023-01-03: qty 100

## 2023-01-03 MED ORDER — NOREPINEPHRINE 4 MG/250ML-% IV SOLN
INTRAVENOUS | Status: AC
  Administered 2023-01-03: 6 ug/min via INTRAVENOUS
  Filled 2023-01-03: qty 250

## 2023-01-03 MED ORDER — NOREPINEPHRINE 4 MG/250ML-% IV SOLN: 2.0000 ug/min | INTRAVENOUS | Status: DC

## 2023-01-03 MED ORDER — WHITE PETROLATUM EX OINT
TOPICAL_OINTMENT | CUTANEOUS | Status: DC | PRN
  Filled 2023-01-03: qty 28.35

## 2023-01-03 MED ORDER — SODIUM CHLORIDE 0.9 % IV SOLN
2.0000 g | Freq: Three times a day (TID) | INTRAVENOUS | Status: DC
  Administered 2023-01-03 – 2023-01-07 (×13): 2 g via INTRAVENOUS
  Filled 2023-01-03 (×14): qty 12.5

## 2023-01-03 MED ORDER — METOPROLOL TARTRATE 5 MG/5ML IV SOLN
5.0000 mg | INTRAVENOUS | Status: AC
  Administered 2023-01-03: 5 mg via INTRAVENOUS
  Filled 2023-01-03: qty 5

## 2023-01-03 MED ORDER — SODIUM CHLORIDE 0.9 % IV SOLN
250.0000 mL | INTRAVENOUS | Status: DC
  Administered 2023-01-03: 250 mL via INTRAVENOUS

## 2023-01-03 NOTE — Progress Notes (Addendum)
PT Cancellation Note  Patient Details Name: Amber Williams MRN: [id number] DOB: 05/13/1975   Cancelled Treatment:    Reason Eval/Treat Not Completed: (P) Medical issues which prohibited therapy, pt HR 145-150bpm at rest, intermittent desats to 70's on 30L at 100% HHFNC when this PTA checking in. Will check back tomorrow to continue with PT POC.   Lenora Boys. PTA Acute Rehabilitation Services Office: [phone]   Catalina Antigua 01/03/2023, 4:33 PM

## 2023-01-03 NOTE — Progress Notes (Signed)
Pharmacy Antibiotic Note  Amber Williams is a 47 y.o. female admitted on 12/25/2022 with pneumonia treated with abx.  Pharmacy has been consulted for cefepime dosing for concern for sepsis.  Plan: Cefepime 2g IV q8h Follow up renal function, cultures as available, clinical progress, length of tx  Height: 5\' 5"  (165.1 cm) Weight: 77 kg (169 lb 12.1 oz) IBW/kg (Calculated) : 57  Temp (24hrs), Avg:98.8 F (37.1 C), Min:97.5 F (36.4 C), Max:102.1 F (38.9 C)  Recent Labs  Lab 12/30/22 1753 12/31/22 0214 01/01/23 0216 01/01/23 0924 01/01/23 1903 01/02/23 0043 01/02/23 0255 01/02/23 0850 01/03/23 0256  WBC  --  17.4* 22.2*   < > 20.9* 17.7* 14.2* 12.6* 18.5*  CREATININE 1.05* 0.79 0.88  --   --   --  0.63  --  0.67   < > = values in this interval not displayed.    Estimated Creatinine Clearance: 90.2 mL/min (by C-G formula based on SCr of 0.67 mg/dL).    Allergies  Allergen Reactions   Silicone Hives and Itching   Dimethyl Fumarate Itching and Hives    Antimicrobials this admission: Vanco 8/26 >> 8/29 Cefepime 8/26 >> 8/29, resume 9/4 >> Augmentin 8/29 >> 9/1  Dose adjustments this admission:  Microbiology results: 8/26 Bcx: NGTD 8/26 Sputum Cx: sent MRSA PCR: neg COVID-19 positive on 12/03/2022 and 12/16/2022  9/4 BCx:  Thank you for allowing pharmacy to be a part of this patient's care.  Loralee Pacas, PharmD, BCPS 01/03/2023 8:49 PM  Please check AMION for all [facility] Pharmacy phone numbers After 10:00 PM, call Main Pharmacy [phone]

## 2023-01-03 NOTE — Progress Notes (Signed)
OT Cancellation Note  Patient Details Name: Amber Williams MRN: [id number] DOB: 08/25/1975   Cancelled Treatment:    Reason Eval/Treat Not Completed: Patient not medically ready (30L at 100% HHFNC and HR 1402-150s and intermittent desats; 76% when OT checked in.)  [name], OTD, OTR/L [facility] Acute Rehabilitation Office: [phone]   [name] 01/03/2023, 3:44 PM

## 2023-01-03 NOTE — Progress Notes (Signed)
Pt taken off BIPAP per pt request and placed back on HHFNC at 35L 95%.  Pt tolerating well at this time, maintaining 95%<.

## 2023-01-03 NOTE — Progress Notes (Addendum)
eLink Physician-Brief Progress Note Patient Name: Amber Williams DOB: Aug 10, 1975 MRN: [id number]   Date of Service  01/03/2023  HPI/Events of Note  Worsening tachypnea and tachycardia on-now temp of 102, respirations up into the 30s.  Refusing BiPAP despite previous Ativan.  Increasing confusion.  On a steroid taper in the setting of potential MS flare. Fatigued appearing on high flow [place].   eICU Interventions  Resume Precedex for agitated delirium, RASS goal 0 to +1  Patient states she does not want to be intubated but is full code -probably deserves another conversation when she is less confused.  IV Tylenol, blood cultures   2039 -patient is visibly distressed in the room with blood cultures being collected now.  This is despite Precedex at 0.5.  She is somewhat tolerating the BiPAP for now.  Has ongoing intermittent delirium, concerned Ativan may be contributing to this-minimize use of possible.  Increase Precedex ceiling to 1.2.  Add on cefepime in the setting of active fevers  2156 -having mild hypotension in the setting of Precedex.  Persistent fever.  Intermittently tolerating BiPAP.  2314 -has had multiple in/out caths in the past 24 hours and currently scanning 440 in the bladder.  Will place Foley.  Intervention Category Intermediate Interventions: Respiratory distress - evaluation and management  Ishaq Maffei 01/03/2023, 7:37 PM
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
## 2023-01-03 NOTE — Progress Notes (Signed)
Pt placed on BIPAP to rest at this time. Pt tolerating well at this time.   01/03/23 0021  Vent Select  Invasive or Noninvasive Noninvasive  Adult Vent Y  Adult Ventilator Settings  Vent Type Servo i  Vent Mode PCV;BIPAP  Set Rate 18 bmp  FiO2 (%) 100 %  IPAP 10 cmH20  EPAP 5 cmH20  Pressure Control 5 cmH20  PEEP 5 cmH20  Adult Ventilator Measurements  Peak Airway Pressure 13 L/min  Mean Airway Pressure 8 cmH20  Resp Rate Spontaneous 10 br/min  Resp Rate Total 28 br/min  Exhaled Vt 477 mL  Measured Ve 15.8 L  I:E Ratio Measured 1:1.3  Auto PEEP 0 cmH20  Total PEEP 5 cmH20  SpO2 98 %  Adult Ventilator Alarms  Alarms On Y  Ve High Alarm 21 L/min  Ve Low Alarm 4 L/min  Resp Rate High Alarm 40 br/min  Resp Rate Low Alarm 8  PEEP Low Alarm 3 cmH2O  Press High Alarm 30 cmH2O  VAP Prevention  HOB> 30 Degrees Y  Equipment wiped down Yes  Breath Sounds  Bilateral Breath Sounds Clear

## 2023-01-03 NOTE — Plan of Care (Signed)
  Problem: Clinical Measurements: Goal: Ability to maintain a body temperature in the normal range will improve Outcome: Progressing   Problem: Respiratory: Goal: Ability to maintain a clear airway will improve Outcome: Progressing   Problem: Education: Goal: Knowledge of General Education information will improve Description: Including pain rating scale, medication(s)/side effects and non-pharmacologic comfort measures Outcome: Progressing   Problem: Clinical Measurements: Goal: Will remain free from infection Outcome: Progressing   Problem: Safety: Goal: Ability to remain free from injury will improve Outcome: Progressing   Problem: Coping: Goal: Ability to adjust to condition or change in health will improve Outcome: Progressing

## 2023-01-04 DIAGNOSIS — J189 Pneumonia, unspecified organism: Secondary | ICD-10-CM | POA: Diagnosis not present

## 2023-01-04 DIAGNOSIS — A419 Sepsis, unspecified organism: Secondary | ICD-10-CM | POA: Diagnosis not present

## 2023-01-04 LAB — POCT I-STAT 7, (LYTES, BLD GAS, ICA,H+H)
Acid-Base Excess: 8 mmol/L — ABNORMAL HIGH (ref 0.0–2.0)
Bicarbonate: 32.8 mmol/L — ABNORMAL HIGH (ref 20.0–28.0)
Calcium, Ion: 1.39 mmol/L (ref 1.15–1.40)
HCT: 30 % — ABNORMAL LOW (ref 36.0–46.0)
Hemoglobin: 10.2 g/dL — ABNORMAL LOW (ref 12.0–15.0)
O2 Saturation: 98 %
Patient temperature: 97.3
Potassium: 4.4 mmol/L (ref 3.5–5.1)
Sodium: 133 mmol/L — ABNORMAL LOW (ref 135–145)
TCO2: 34 mmol/L — ABNORMAL HIGH (ref 22–32)
pCO2 arterial: 45.8 mmHg (ref 32–48)
pH, Arterial: 7.461 — ABNORMAL HIGH (ref 7.35–7.45)
pO2, Arterial: 93 mmHg (ref 83–108)

## 2023-01-04 LAB — BASIC METABOLIC PANEL
Anion gap: 14 (ref 5–15)
BUN: 22 mg/dL — ABNORMAL HIGH (ref 6–20)
CO2: 31 mmol/L (ref 22–32)
Calcium: 9.5 mg/dL (ref 8.9–10.3)
Chloride: 91 mmol/L — ABNORMAL LOW (ref 98–111)
Creatinine, Ser: 0.61 mg/dL (ref 0.44–1.00)
GFR, Estimated: >60 mL/min (ref >60)
Glucose, Bld: 162 mg/dL — ABNORMAL HIGH (ref 70–99)
Potassium: 4.3 mmol/L (ref 3.5–5.1)
Sodium: 136 mmol/L (ref 135–145)

## 2023-01-04 LAB — URINE CULTURE: Culture: 40000 — AB

## 2023-01-04 LAB — CBC
HCT: 32.8 % — ABNORMAL LOW (ref 36.0–46.0)
Hemoglobin: 10.1 g/dL — ABNORMAL LOW (ref 12.0–15.0)
MCH: 25.8 pg — ABNORMAL LOW (ref 26.0–34.0)
MCHC: 30.8 g/dL (ref 30.0–36.0)
MCV: 83.9 fL (ref 80.0–100.0)
Platelets: 80 10*3/uL — ABNORMAL LOW (ref 150–400)
RBC: 3.91 MIL/uL (ref 3.87–5.11)
RDW: 16.6 % — ABNORMAL HIGH (ref 11.5–15.5)
WBC: 19.6 10*3/uL — ABNORMAL HIGH (ref 4.0–10.5)
nRBC: 0.1 % (ref 0.0–0.2)

## 2023-01-04 LAB — HEMOGLOBIN A1C
Hgb A1c MFr Bld: 6.4 % — ABNORMAL HIGH (ref 4.8–5.6)
Mean Plasma Glucose: 136.98 mg/dL

## 2023-01-04 LAB — GLUCOSE, CAPILLARY
Glucose-Capillary: 149 mg/dL — ABNORMAL HIGH (ref 70–99)
Glucose-Capillary: 125 mg/dL — ABNORMAL HIGH (ref 70–99)
Glucose-Capillary: 98 mg/dL (ref 70–99)
Glucose-Capillary: 108 mg/dL — ABNORMAL HIGH (ref 70–99)
Glucose-Capillary: 128 mg/dL — ABNORMAL HIGH (ref 70–99)

## 2023-01-04 LAB — PROCALCITONIN: Procalcitonin: 0.31 ng/mL

## 2023-01-04 MED ORDER — FUROSEMIDE 10 MG/ML IJ SOLN
10.0000 mg | Freq: Every day | INTRAMUSCULAR | Status: DC
  Administered 2023-01-04 – 2023-01-07 (×4): 10 mg via INTRAVENOUS
  Filled 2023-01-04 (×4): qty 2

## 2023-01-04 MED ORDER — PANTOPRAZOLE SODIUM 40 MG IV SOLR
40.0000 mg | Freq: Two times a day (BID) | INTRAVENOUS | Status: DC
  Administered 2023-01-04 – 2023-01-10 (×13): 40 mg via INTRAVENOUS
  Filled 2023-01-04 (×13): qty 10

## 2023-01-04 MED ORDER — INSULIN ASPART 100 UNIT/ML IJ SOLN
0.0000 [IU] | INTRAMUSCULAR | Status: DC
  Administered 2023-01-04 – 2023-01-05 (×4): 2 [IU] via SUBCUTANEOUS
  Administered 2023-01-06 (×2): 3 [IU] via SUBCUTANEOUS
  Administered 2023-01-06: 2 [IU] via SUBCUTANEOUS
  Administered 2023-01-06: 5 [IU] via SUBCUTANEOUS
  Administered 2023-01-06: 2 [IU] via SUBCUTANEOUS
  Administered 2023-01-06 – 2023-01-07 (×2): 3 [IU] via SUBCUTANEOUS

## 2023-01-04 MED ORDER — SODIUM CHLORIDE 0.9 % IV SOLN
500.0000 mg | Freq: Every day | INTRAVENOUS | Status: AC
  Administered 2023-01-04 – 2023-01-06 (×3): 500 mg via INTRAVENOUS
  Filled 2023-01-04 (×3): qty 500

## 2023-01-04 NOTE — Progress Notes (Signed)
PT Cancellation Note  Patient Details Name: Amber Williams MRN: [id number] DOB: [birth year]   Cancelled Treatment:    Reason Eval/Treat Not Completed: (P) Medical issues which prohibited therapy, (Pt HR in 130s and continuing to require significant amounts of O2. Will follow up as pt medically appropriate.)   Lenora Boys. PTA Acute Rehabilitation Services Office: [phone]   Catalina Antigua 01/04/2023, 4:07 PM

## 2023-01-04 NOTE — Plan of Care (Signed)
  Problem: Respiratory: Goal: Ability to maintain adequate ventilation will improve Outcome: Progressing   Problem: Education: Goal: Knowledge of General Education information will improve Description: Including pain rating scale, medication(s)/side effects and non-pharmacologic comfort measures Outcome: Progressing   Problem: Health Behavior/Discharge Planning: Goal: Ability to manage health-related needs will improve Outcome: Progressing   Problem: Clinical Measurements: Goal: Ability to maintain a body temperature in the normal range will improve Outcome: Progressing   Problem: Activity: Goal: Ability to tolerate increased activity will improve Outcome: Progressing

## 2023-01-04 NOTE — Plan of Care (Signed)
  Problem: Activity: Goal: Ability to tolerate increased activity will improve Outcome: Progressing   Problem: Clinical Measurements: Goal: Ability to maintain a body temperature in the normal range will improve Outcome: Progressing   Problem: Respiratory: Goal: Ability to maintain adequate ventilation will improve Outcome: Progressing Goal: Ability to maintain a clear airway will improve Outcome: Progressing   Problem: Education: Goal: Knowledge of General Education information will improve Description: Including pain rating scale, medication(s)/side effects and non-pharmacologic comfort measures Outcome: Progressing   Problem: Health Behavior/Discharge Planning: Goal: Ability to manage health-related needs will improve Outcome: Progressing   Problem: Clinical Measurements: Goal: Ability to maintain clinical measurements within normal limits will improve Outcome: Progressing Goal: Will remain free from infection Outcome: Progressing Goal: Diagnostic test results will improve Outcome: Progressing Goal: Respiratory complications will improve Outcome: Progressing Goal: Cardiovascular complication will be avoided Outcome: Progressing   Problem: Activity: Goal: Risk for activity intolerance will decrease Outcome: Progressing   Problem: Nutrition: Goal: Adequate nutrition will be maintained Outcome: Progressing   Problem: Coping: Goal: Level of anxiety will decrease Outcome: Progressing   Problem: Elimination: Goal: Will not experience complications related to bowel motility Outcome: Progressing Goal: Will not experience complications related to urinary retention Outcome: Progressing   Problem: Pain Managment: Goal: General experience of comfort will improve Outcome: Progressing   Problem: Safety: Goal: Ability to remain free from injury will improve Outcome: Progressing   Problem: Skin Integrity: Goal: Risk for impaired skin integrity will decrease Outcome:  Progressing   Problem: Education: Goal: Ability to describe self-care measures that may prevent or decrease complications (Diabetes Survival Skills Education) will improve Outcome: Progressing Goal: Individualized Educational Video(s) Outcome: Progressing   Problem: Coping: Goal: Ability to adjust to condition or change in health will improve Outcome: Progressing   Problem: Fluid Volume: Goal: Ability to maintain a balanced intake and output will improve Outcome: Progressing   Problem: Health Behavior/Discharge Planning: Goal: Ability to identify and utilize available resources and services will improve Outcome: Progressing Goal: Ability to manage health-related needs will improve Outcome: Progressing   Problem: Metabolic: Goal: Ability to maintain appropriate glucose levels will improve Outcome: Progressing   Problem: Nutritional: Goal: Maintenance of adequate nutrition will improve Outcome: Progressing Goal: Progress toward achieving an optimal weight will improve Outcome: Progressing   Problem: Skin Integrity: Goal: Risk for impaired skin integrity will decrease Outcome: Progressing   Problem: Tissue Perfusion: Goal: Adequacy of tissue perfusion will improve Outcome: Progressing   

## 2023-01-04 NOTE — Progress Notes (Signed)
OT Cancellation Note  Patient Details Name: Amber Williams MRN: [id number] DOB: 11/16/1975   Cancelled Treatment:    Reason Eval/Treat Not Completed: Patient not medically ready (Pt HR in 130s and continuing to require significant amounts of O2. Will follow up as pt medically appropriate.)  Tyler Deis, OTR/L [facility] Acute Rehabilitation Office: [phone]   [name] 01/04/2023, 3:40 PM

## 2023-01-04 NOTE — Hospital Course (Signed)
Taper following IV methylprednisolone or prednisone pulse therapy: Oral: 1 mg/kg/day (maximum: 80 mg/day), followed by a taper; total duration of oral therapy is usually 11 to 14 days (Ref). Tapering schedules vary and some experts prefer to omit taper following initial pulse glucocorticoid therapy (Ref).
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
## 2023-01-04 NOTE — Progress Notes (Signed)
Spoke with patient, patient's sister and patient's boyfriend at the bedside. Earlier in the shift the patient and patient's sister had requested to only do baths every other day and to only do wound care daily. Patient has been tachycardic a significant portion of this afternoon and has not been feeling well enough to perform much mobility. This RN discussed the risks of performing wound care at this time with the patient's heart rate already being in 130's at rest, SpO2 94% at rest with labored breathing and having just come off of levophed gtt. The patient and family are in agreement they do not want wound care or a bath performed at this time or anytime today. The goal for the patient is to be placed on BiPAP overnight and in the morning once she has had some rest with the assistance of the BiPAP to attempt a bath and wound care then. Will report to oncoming RN.

## 2023-01-04 NOTE — Progress Notes (Signed)
RT NOTE: CPT initiated through bed per MD order. PT tolerated well for a couple of minutes but then blood pressure dropped significantly. CPT turned off at this time. RN at bedside. RT will continue to monitor.

## 2023-01-04 NOTE — Progress Notes (Signed)
Initial Nutrition Assessment  DOCUMENTATION CODES:   Not applicable  INTERVENTION:   -Once cortrak is placed and positioning confirmed, recommend:   Initiate Osmolite @ 20 ml/hr and increase by 10 ml every 8 hours to goal rate of 50 ml/hr.   60 ml Prosource TF BID.    Tube feeding regimen provides 1960 kcal (100% of needs), 115 grams of protein, and 914 ml of H2O.    -1 packet Juven BID via tube, each packet provides 95 calories, 2.5 grams of protein (collagen), and 9.8 grams of carbohydrate (3 grams sugar); also contains 7 grams of L-arginine and L-glutamine, 300 mg vitamin C, 15 mg vitamin E, 1.2 mcg vitamin B-12, 9.5 mg zinc, 200 mg calcium, and 1.5 g  Calcium Beta-hydroxy-Beta-methylbutyrate to support wound healing   -Monitor Mg, K, and Phos and replete as needed secondary ot high refeeding risk  -100 mg thiamine daily x 5 days  NUTRITION DIAGNOSIS:   Inadequate oral intake related to inability to eat as evidenced by NPO status.  GOAL:   Patient will meet greater than or equal to 90% of their needs  MONITOR:   Diet advancement  REASON FOR ASSESSMENT:   Other (Comment)    ASSESSMENT:   Pt with medical history significant of multiple sclerosis, DVT on Eliquis, hypothyroidism, hypertension, hyperlipidemia, GERD who presents due to persistent shortness of breath.  Pt admitted with sepsis secondary to multifocal pneumonia.   8/28- transferred from [facility] to [facility], placed on Bi-pap 8/29- off Bi-pap 9/1- placed on Bi-pap 9/2- transitioned to [facility]  Reviewed I/O's: -912 ml x 24 hours and -8 L since admission  UOP: 1.5 L x 24 hours  Per H&P, pt was recently admitted to [facility] for COVID pneumonia and was discharge a few days PTA.   Per PCCM notes on 01/01/23, pt scheduled for MRI of brain secondary to lt twitching and reduced movement of rt foot, however, too medically stable to go down for MRI at that time.   Pt has been requiring some Bi-pap at night, however, pt  often resistant due to anxiety.   Pt previously in a regular diet with variable meal completions (15-70%). RN reports poor appetite when on oral diet. Pt is currently NPO and a high intubation risk. Noted order for cortrak placed this AM. Case discussed with RN and MD; pt is on HHFNC and unable to place standard NGT. Plan to proceed with cortrak placement tomorrow.   Medications reviewed and include lasix, miralax, senokot, precedex, solu-medrol, and levophed.  Reviewed wt hx; pt has experienced a 5% wt loss over the past 3 months. While this is not significant for time frame, it is concerning given multiple hospitalizations and presence of pressure injuries. Pt is at high risk for malnutrition and is at high refeeding risk.   No results found for: "HGBA1C" PTA DM medications are .   Labs reviewed: Na: 133, CBGS: 78-149 (inpatient orders for glycemic control are 0-15 units insulin aspart every 4 hours).    Diet Order:   Diet Order             Diet NPO time specified  Diet effective now                   EDUCATION NEEDS:   No education needs have been identified at this time  Skin:  Skin Assessment: Skin Integrity Issues: Skin Integrity Issues:: Stage II, Unstageable Stage II: rt and lt buttocks Unstageable: sacrum  Last BM:  12/26/22  Height:   Ht Readings from Last 1 Encounters:  12/25/22 5\' 5"  (1.651 m)    Weight:   Wt Readings from Last 1 Encounters:  01/04/23 76.7 kg    Ideal Body Weight:  56.8 kg  BMI:  Body mass index is 28.14 kg/m.  Estimated Nutritional Needs:   Kcal:  1800-2000  Protein:  100-115 grams  Fluid:  > 1.8 L    Levada Schilling, RD, LDN, CDCES Registered Dietitian II Certified Diabetes Care and Education Specialist Please refer to [facility] for RD and/or RD on-call/weekend/after hours pager

## 2023-01-04 NOTE — Progress Notes (Signed)
RT NOTE: RT attempted CPT again through bed per order.  PT tolerated for 4 minutes but then blood pressure dropped again significantly. CPT turned off at this time. BP now improved. RT will continue to monitor.

## 2023-01-05 ENCOUNTER — Inpatient Hospital Stay (HOSPITAL_COMMUNITY): Payer: Medicare HMO

## 2023-01-05 DIAGNOSIS — J9601 Acute respiratory failure with hypoxia: Secondary | ICD-10-CM | POA: Diagnosis not present

## 2023-01-05 DIAGNOSIS — J189 Pneumonia, unspecified organism: Secondary | ICD-10-CM | POA: Diagnosis not present

## 2023-01-05 LAB — CBC
HCT: 30.2 % — ABNORMAL LOW (ref 36.0–46.0)
Hemoglobin: 9.5 g/dL — ABNORMAL LOW (ref 12.0–15.0)
MCH: 26.3 pg (ref 26.0–34.0)
MCHC: 31.5 g/dL (ref 30.0–36.0)
MCV: 83.7 fL (ref 80.0–100.0)
Platelets: 94 10*3/uL — ABNORMAL LOW (ref 150–400)
RBC: 3.61 MIL/uL — ABNORMAL LOW (ref 3.87–5.11)
RDW: 16.5 % — ABNORMAL HIGH (ref 11.5–15.5)
WBC: 11.5 10*3/uL — ABNORMAL HIGH (ref 4.0–10.5)
nRBC: 0 % (ref 0.0–0.2)

## 2023-01-05 LAB — BASIC METABOLIC PANEL
Anion gap: 7 (ref 5–15)
BUN: 25 mg/dL — ABNORMAL HIGH (ref 6–20)
CO2: 29 mmol/L (ref 22–32)
Calcium: 9.7 mg/dL (ref 8.9–10.3)
Chloride: 99 mmol/L (ref 98–111)
Creatinine, Ser: 0.69 mg/dL (ref 0.44–1.00)
GFR, Estimated: >60 mL/min (ref >60)
Glucose, Bld: 141 mg/dL — ABNORMAL HIGH (ref 70–99)
Potassium: 3.8 mmol/L (ref 3.5–5.1)
Sodium: 135 mmol/L (ref 135–145)

## 2023-01-05 LAB — GLUCOSE, CAPILLARY
Glucose-Capillary: 123 mg/dL — ABNORMAL HIGH (ref 70–99)
Glucose-Capillary: 115 mg/dL — ABNORMAL HIGH (ref 70–99)
Glucose-Capillary: 110 mg/dL — ABNORMAL HIGH (ref 70–99)
Glucose-Capillary: 111 mg/dL — ABNORMAL HIGH (ref 70–99)
Glucose-Capillary: 115 mg/dL — ABNORMAL HIGH (ref 70–99)
Glucose-Capillary: 135 mg/dL — ABNORMAL HIGH (ref 70–99)

## 2023-01-05 LAB — PHOSPHORUS: Phosphorus: 3.2 mg/dL (ref 2.5–4.6)

## 2023-01-05 LAB — MAGNESIUM: Magnesium: 2.2 mg/dL (ref 1.7–2.4)

## 2023-01-05 MED ORDER — MIDAZOLAM HCL 2 MG/2ML IJ SOLN
INTRAMUSCULAR | Status: AC
  Administered 2023-01-05: 0.5 mg via INTRAVENOUS
  Filled 2023-01-05: qty 2

## 2023-01-05 MED ORDER — APIXABAN 5 MG PO TABS
5.0000 mg | ORAL_TABLET | Freq: Two times a day (BID) | ORAL | Status: DC
  Administered 2023-01-05 – 2023-01-06 (×3): 5 mg
  Filled 2023-01-05 (×3): qty 1

## 2023-01-05 MED ORDER — POTASSIUM CHLORIDE 10 MEQ/100ML IV SOLN
10.0000 meq | INTRAVENOUS | Status: AC
  Administered 2023-01-05 (×4): 10 meq via INTRAVENOUS
  Filled 2023-01-05 (×4): qty 100

## 2023-01-05 MED ORDER — LEVOTHYROXINE SODIUM 25 MCG PO TABS
75.0000 ug | ORAL_TABLET | Freq: Every day | ORAL | Status: DC
  Administered 2023-01-06 – 2023-01-09 (×4): 75 ug
  Filled 2023-01-05 (×4): qty 3

## 2023-01-05 MED ORDER — ENSURE ENLIVE PO LIQD: 237.0000 mL | Freq: Two times a day (BID) | ORAL | Status: DC

## 2023-01-05 MED ORDER — METHYLPREDNISOLONE SODIUM SUCC 40 MG IJ SOLR: 20.0000 mg | Freq: Every day | INTRAMUSCULAR | Status: DC

## 2023-01-05 MED ORDER — HYDROCOD POLI-CHLORPHE POLI ER 10-8 MG/5ML PO SUER
5.0000 mL | Freq: Two times a day (BID) | ORAL | Status: DC
  Administered 2023-01-05 – 2023-01-10 (×9): 5 mL
  Filled 2023-01-05 (×11): qty 5

## 2023-01-05 MED ORDER — METHYLPREDNISOLONE SODIUM SUCC 40 MG IJ SOLR: 40.0000 mg | Freq: Every day | INTRAMUSCULAR | Status: DC

## 2023-01-05 MED ORDER — SENNOSIDES-DOCUSATE SODIUM 8.6-50 MG PO TABS
1.0000 | ORAL_TABLET | Freq: Two times a day (BID) | ORAL | Status: DC
  Administered 2023-01-05 – 2023-01-09 (×4): 1
  Filled 2023-01-05 (×8): qty 1

## 2023-01-05 MED ORDER — PIVOT 1.5 CAL PO LIQD
1000.0000 mL | ORAL | Status: DC
  Filled 2023-01-05: qty 1000

## 2023-01-05 MED ORDER — MIDAZOLAM HCL 2 MG/2ML IJ SOLN: 2.0000 mg | Freq: Once | INTRAMUSCULAR | Status: AC

## 2023-01-05 MED ORDER — ACETAMINOPHEN 325 MG PO TABS
650.0000 mg | ORAL_TABLET | Freq: Four times a day (QID) | ORAL | Status: DC | PRN
  Administered 2023-01-06 – 2023-01-07 (×3): 650 mg
  Filled 2023-01-05 (×4): qty 2

## 2023-01-05 MED ORDER — METHYLPREDNISOLONE SODIUM SUCC 125 MG IJ SOLR
80.0000 mg | Freq: Every day | INTRAMUSCULAR | Status: AC
  Administered 2023-01-07 – 2023-01-09 (×3): 80 mg via INTRAVENOUS
  Filled 2023-01-05 (×3): qty 2

## 2023-01-05 MED ORDER — THIAMINE MONONITRATE 100 MG PO TABS: 100.0000 mg | ORAL_TABLET | Freq: Every day | ORAL | Status: DC

## 2023-01-05 MED ORDER — POLYETHYLENE GLYCOL 3350 17 G PO PACK
17.0000 g | PACK | Freq: Every day | ORAL | Status: DC
  Administered 2023-01-09: 17 g
  Filled 2023-01-05 (×4): qty 1

## 2023-01-05 MED ORDER — QUETIAPINE FUMARATE 100 MG PO TABS
100.0000 mg | ORAL_TABLET | Freq: Every day | ORAL | Status: DC
  Administered 2023-01-05 – 2023-01-08 (×4): 100 mg
  Filled 2023-01-05 (×5): qty 1

## 2023-01-05 MED ORDER — JUVEN PO PACK: 1.0000 | PACK | Freq: Two times a day (BID) | ORAL | Status: DC

## 2023-01-05 MED ORDER — PAROXETINE HCL 20 MG PO TABS
20.0000 mg | ORAL_TABLET | Freq: Every day | ORAL | Status: DC
  Administered 2023-01-05 – 2023-01-08 (×4): 20 mg
  Filled 2023-01-05 (×6): qty 1

## 2023-01-05 MED ORDER — BISACODYL 10 MG RE SUPP
10.0000 mg | Freq: Once | RECTAL | Status: AC
  Administered 2023-01-05: 10 mg via RECTAL
  Filled 2023-01-05: qty 1

## 2023-01-05 MED ORDER — DEXTROMETHORPHAN POLISTIREX ER 30 MG/5ML PO SUER
30.0000 mg | Freq: Two times a day (BID) | ORAL | Status: DC
  Administered 2023-01-05 – 2023-01-10 (×9): 30 mg
  Filled 2023-01-05 (×11): qty 5

## 2023-01-05 MED ORDER — METOCLOPRAMIDE HCL 5 MG/ML IJ SOLN
10.0000 mg | Freq: Once | INTRAMUSCULAR | Status: AC
  Administered 2023-01-05: 10 mg via INTRAVENOUS
  Filled 2023-01-05: qty 2

## 2023-01-05 MED ORDER — METHYLPREDNISOLONE SODIUM SUCC 125 MG IJ SOLR
60.0000 mg | Freq: Every day | INTRAMUSCULAR | Status: DC
  Administered 2023-01-10: 60 mg via INTRAVENOUS
  Filled 2023-01-05: qty 2

## 2023-01-05 NOTE — Progress Notes (Signed)
PT Cancellation Note  Patient Details Name: Amber Williams MRN: [id number] DOB: [birth year]   Cancelled Treatment:    Reason Eval/Treat Not Completed: Medical issues which prohibited therapy (currently with SPO2 85% on 40L, 100%, not currenty ready for mobility)   Kelsye Loomer B Mihcael Ledee 01/05/2023, 7:48 AM Merryl Hacker, PT Acute Rehabilitation Services Office: [phone]

## 2023-01-05 NOTE — Progress Notes (Signed)
eLink Physician-Brief Progress Note Patient Name: Amber Williams DOB: 1975/08/05 MRN: [id number]   Date of Service  01/05/2023  HPI/Events of Note  Patient with known poor gastric motility with Enteric Tube placed earlier today.  Tube terminates in the stomach and is not passing the pylorus.  Concerned that she may have an ileus and question about whether to use the tube for medications.  eICU Interventions  Continue to utilize the enteric tube for medications, hold tube feeds for the time being.     Intervention Category Minor Interventions: Clinical assessment - ordering diagnostic tests  Lira Stephen 01/05/2023, 7:55 PM

## 2023-01-05 NOTE — Procedures (Signed)
Cortrak  Person Inserting Tube:  Maylon Peppers C, RD Tube Type:  Cortrak - 43 inches Tube Size:  10 Tube Location:  Left nare Secured by: Bridle Technique Used to Measure Tube Placement:  Marking at nare/corner of mouth Cortrak Secured At:  65 cm   Cortrak Tube Team Note:  Consult received to place a Cortrak feeding tube.   X-ray is required, abdominal x-ray has been ordered by the Cortrak team. Please confirm tube placement before using the Cortrak tube.   If the tube becomes dislodged please keep the tube and contact the Cortrak team at [email or web address] for replacement.  If after hours and replacement cannot be delayed, place a NG tube and confirm placement with an abdominal x-ray.    Lockie Pares., RD, LDN, CNSC See AMiON for contact information

## 2023-01-05 NOTE — Progress Notes (Signed)
Heart Failure Navigator Progress Note  Assessed for Heart & Vascular TOC clinic readiness.   Patient admitted with increasing shortness of breath and hypoxia. EF 40-45% and mildly reduced RV systolic function. Symptoms related to CHF, sepsis 2/2 bilateral multifocal pneumonia, post-COVID syndrome, and possible MS flare. She is still requiring HHFNC 40 L/min.   Will continue to monitor patient progression.   Sharen Hones, PharmD, BCPS Heart Failure Stewardship Pharmacist Phone [phone]

## 2023-01-05 NOTE — Progress Notes (Signed)
Cortrak Tube Team Note:  Attempted to reposition cortrak tube post pyloric. Unsuccessful, tube secured at 64 cm.  Recommend advance tube under fluoro.   X-ray is required, abdominal x-ray has been ordered by the Cortrak team. Please confirm tube placement before using the Cortrak tube.   If the tube becomes dislodged please keep the tube and contact the Cortrak team at [email or web address] for replacement.  If after hours and replacement cannot be delayed, place a NG tube and confirm placement with an abdominal x-ray.    Cammy Copa., RD, LDN, CNSC See AMiON for contact information

## 2023-01-05 NOTE — Progress Notes (Signed)
OT Cancellation Note  Patient Details Name: Amber Williams MRN: [id number] DOB: 09/19/1975   Cancelled Treatment:    Reason Eval/Treat Not Completed: Medical issues which prohibited therapy.  Pt currently limited secondary to increased O2 requirement at 40 lpm.  Will hold OT treatment at this time.   Kees Idrovo OTR/L 01/05/2023, 8:40 AM

## 2023-01-05 NOTE — Progress Notes (Addendum)
Nutrition Follow-up  DOCUMENTATION CODES:   Not applicable  INTERVENTION:   -D/c Ensure Enlive po BID, each supplement provides 350 kcal and 20 grams of protein.   -Per MD, plan to initiate TPN tomorrow (01/06/23) due to inability to secure enteral access. Pt is at high refeeding risk. Monitor Mg, K, and Phos daily as needed related to high refeeding risk. Also recommend 100 mg thiamine daily x 5 days  -Monitor for ability to transition to enteral nutrition. If able to secure post-pyloric access, recommend:   Initiate Pivot 1.5 @ 20 ml/hr and increase by 10 ml every 8 hours to goal rate of 50 ml/hr.   Tube feeding regimen provides 1800 kcal (100% of needs), 113 grams of protein, and 900 ml of H2O.    -1 packet Juven BID, each packet provides 95 calories, 2.5 grams of protein (collagen), and 9.8 grams of carbohydrate (3 grams sugar); also contains 7 grams of L-arginine and L-glutamine, 300 mg vitamin C, 15 mg vitamin E, 1.2 mcg vitamin B-12, 9.5 mg zinc, 200 mg calcium, and 1.5 g  Calcium Beta-hydroxy-Beta-methylbutyrate to support wound healing  -Once feeding is initiated, monitor Mg, K, and Phos and replete as needed secondary ot high refeeding risk -Once feeding is initiated, 100 mg thiamine daily x 5 days secondary to high refeeding risk  NUTRITION DIAGNOSIS:   Inadequate oral intake related to inability to eat as evidenced by NPO status.  Ongoing  GOAL:   Patient will meet greater than or equal to 90% of their needs  Progressing   MONITOR:   Diet advancement  REASON FOR ASSESSMENT:   Other (Comment)    ASSESSMENT:   Pt with medical history significant of multiple sclerosis, DVT on Eliquis, hypothyroidism, hypertension, hyperlipidemia, GERD who presents due to persistent shortness of breath.  8/28- transferred from [facility] to [facility], placed on Bi-pap 8/29- off Bi-pap 9/1- placed on Bi-pap 9/2- transitioned to [facility] 9/6- cortrak tube placed (gastric); PCCM confirmed gastric  placement  Reviewed I/O's: -666 ml x 24 hours and -8.6 L since admission  UOP: 1.4 L x 24 hours  Noted pt with history of gastric bypass in 2010. Per review of CareEverywhere, last bariatric surgery follow-up was in 2011.   Pt currently on HHFNC and on Bi-pap at night. Pt was unable to tolerate wound care or chest PT today. Pt remains at high intubation risk.   Case discussed with cortrak RD. Pt with high O2 requirements and RN at bedside during placement. Due to history of gastric bypass and bi-pap needs, plan to try for post-pyloric tube placement.   Case discussed with RN and MD; received permission to start TF today. PCCM reviewed x-ray and confirmed gastric tube placement. Due to history of gastric bypass surgery, cortrak RD attempted to advance tube, however, was unsuccessful. Post-pyloric placement is recommended in pt's requiring bi-pap in order to reduce the risk of vomiting. Cortrak RD recommending advancement of tube under fluoroscopy, however, pt not stable enough to go down for procedures at this time (investigating possibility of doing procedure at bedside with C arm). If unable to advance tube successfully, may need to consider TPN (pt has been 10 days without significant nutrition. Per MD, do not anticipate placement will be easier tomorrow, so plan to initiate TPN on 01/06/23.   Medications reviewed and include dulcolax, lasix, solu-medrol, senokot, precedex, levophed, and potassium.   Labs reviewed: CBGS: 108-128 (inpatient orders for glycemic control are 0-15 units insulin aspart every 4 hours).  Diet Order:   Diet Order             Diet NPO time specified  Diet effective now                   EDUCATION NEEDS:   No education needs have been identified at this time  Skin:  Skin Assessment: Skin Integrity Issues: Skin Integrity Issues:: Stage II, Unstageable Stage II: rt and lt buttocks Unstageable: sacrum  Last BM:  12/26/22  Height:   Ht Readings from  Last 1 Encounters:  12/26/22 5\' 5"  (1.651 m)    Weight:   Wt Readings from Last 1 Encounters:  01/05/23 76.8 kg    Ideal Body Weight:  56.8 kg  BMI:  Body mass index is 28.18 kg/m.  Estimated Nutritional Needs:   Kcal:  1800-2000  Protein:  100-115 grams  Fluid:  > 1.8 L    Levada Schilling, RD, LDN, CDCES Registered Dietitian II Certified Diabetes Care and Education Specialist Please refer to [facility] for RD and/or RD on-call/weekend/after hours pager

## 2023-01-05 NOTE — TOC Initial Note (Signed)
Transition of Care [facility]) - Initial/Assessment Note    Patient Details  Name: Amber Williams MRN: [id number] Date of Birth: 1975/11/26  Transition of Care [facility]) CM/SW Contact:    Elliot Cousin, RN Phone Number: [phone] 01/05/2023, 2:56 PM  Clinical Narrative:  CM spoke to pt and communication was limited due to pt was on BIPAP. States she lives at home with her boyfriend. Gave permission to speak to boyfriend. Will possible need home oxygen or IP rehab. CM/CSW will continue to follow for dc needs.                  Expected Discharge Plan: IP Rehab Facility Barriers to Discharge: Continued Medical Work up   Patient Goals and CMS Choice Patient states their goals for this hospitalization and ongoing recovery are:: wants to get better          Expected Discharge Plan and Services   Discharge Planning Services: CM Consult   Living arrangements for the past 2 months: Apartment                                      Prior Living Arrangements/Services Living arrangements for the past 2 months: Apartment Lives with:: Significant Other Patient language and need for interpreter reviewed:: Yes Do you feel safe going back to the place where you live?: Yes      Need for Family Participation in Patient Care: Yes (Comment) Care giver support system in place?: Yes (comment)   Criminal Activity/Legal Involvement Pertinent to Current Situation/Hospitalization: No - Comment as needed  Activities of Daily Living Home Assistive Devices/Equipment: Walker (specify type), Cane (specify quad or straight), Shower chair without back, Oxygen, Raised toilet seat with rails, Eyeglasses ADL Screening (condition at time of admission) Patient's cognitive ability adequate to safely complete daily activities?: Yes Is the patient deaf or have difficulty hearing?: No Does the patient have difficulty seeing, even when wearing glasses/contacts?: No Does the patient have difficulty  concentrating, remembering, or making decisions?: No Patient able to express need for assistance with ADLs?: Yes Does the patient have difficulty dressing or bathing?: No Independently performs ADLs?: No Communication: Independent Dressing (OT): Independent Grooming: Independent Feeding: Independent Bathing: Independent Toileting: Independent In/Out Bed: Independent Walks in Home: Independent with device (comment) Does the patient have difficulty walking or climbing stairs?: Yes Weakness of Legs: Right Weakness of Arms/Hands: Both  Permission Sought/Granted Permission sought to share information with : Case Manager, Family Supports Permission granted to share information with : Yes, Verbal Permission Granted  Share Information with NAME: Caffie Damme     Permission granted to share info w Relationship: SO  Permission granted to share info w Contact Information: [phone]  Emotional Assessment Appearance:: Appears stated age Attitude/Demeanor/Rapport: Engaged Affect (typically observed): Accepting Orientation: : Oriented to Self, Oriented to Place, Oriented to  Time, Oriented to Situation   Psych Involvement: No (comment)  Admission diagnosis:  Multifocal pneumonia [J18.9] Pneumonia due to infectious organism, unspecified laterality, unspecified part of lung [J18.9] Sepsis, due to unspecified organism, unspecified whether acute organ dysfunction present [facility]) [A41.9] Patient Active Problem List   Diagnosis Date Noted   Pneumonia due to infectious organism 12/26/2022   Acute respiratory failure with hypoxia (HCC) 12/26/2022   Hypoalbuminemia due to protein-calorie malnutrition (HCC) 12/26/2022   Hyponatremia 12/26/2022   Essential hypertension 12/26/2022   Sepsis (HCC) 12/26/2022   Lobar pneumonia (HCC) 12/26/2022  Dysphagia 07/18/2021   DVT (deep venous thrombosis) (HCC) 05/09/2021   Other dysphagia 03/30/2021   Acute cystitis without hematuria 09/16/2020    Leukocytosis 09/16/2020   Pressure injury of sacral region, stage 2 (HCC) 09/16/2020   Candidal intertrigo 08/03/2020   GERD without esophagitis 08/03/2020   Cellulitis of abdominal wall 08/02/2020   Dark red stool    History of rectal or anal cancer    Genetic testing 08/11/2019   Family history of breast cancer    Port-A-Cath in place 01/23/2019   Anal cancer (HCC) 01/10/2019   Anal condyloma    Hemorrhoids, external, with complication 08/30/2018   Neurogenic bladder 08/03/2016   Gait abnormality 08/03/2016   Right foot drop 08/03/2016   Acquired hypothyroidism 01/31/2016   Insomnia 01/31/2016   Multiple sclerosis (HCC) 01/31/2016   PTSD (post-traumatic stress disorder) 01/31/2016   H/O gastric bypass 07/26/2011   Depression 01/16/2011   PCP:  Mechele Claude, MD Pharmacy:   [facility] - [place], [place] - [address] [place] [address] Phone: [phone] Fax: [phone]  [facility] - [place], [place] - [address] [facility] [place] AT Portal to Registered [facility] Sites [address] [place] [address] Phone: [phone] Fax: [phone]  [facility] - [place], [place] - [address] [place] [address] Phone: [phone] Fax: [phone]     Social Determinants of Health (SDOH) Social History: SDOH Screenings   Food Insecurity: Food Insecurity Present (12/26/2022)  Housing: Low Risk  (12/26/2022)  Transportation Needs: No Transportation Needs (12/26/2022)  Utilities: Not At Risk (12/21/2022)  Alcohol Screen: Low Risk  (10/03/2021)  Depression (PHQ2-9): Medium Risk (11/20/2022)  Financial Resource Strain: Medium Risk (12/11/2022)   Received from [facility]  Physical Activity: Insufficiently Active (08/21/2022)  Social Connections: Socially Integrated (08/21/2022)  Stress: No Stress Concern Present (08/21/2022)  Tobacco Use: Medium Risk (12/19/2022)   Received from [facility] Literacy: Low Risk  (12/11/2022)   Received from [facility]   SDOH Interventions:     Readmission Risk Interventions     No data to display

## 2023-01-05 NOTE — Progress Notes (Signed)
RT NOTE: CPT held at this time as patient had coughing fit/desaturation episode. RT placed NRB over HHFNC with sats improving from 85% to 100%. Vitals are stable. RT will continue to monitor.
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
## 2023-01-05 NOTE — Progress Notes (Signed)
   01/05/23 2222  BiPAP/CPAP/SIPAP  BiPAP/CPAP/SIPAP Pt Type Adult  BiPAP/CPAP/SIPAP SERVO  Mask Type Full face mask  Mask Size Small  Set Rate 18 breaths/min  Respiratory Rate 27 breaths/min  IPAP 10 cmH20  EPAP 5 cmH2O  Pressure Support 5 cmH20  FiO2 (%) 100 %  Minute Ventilation 16.5  Leak 42  Peak Inspiratory Pressure (PIP) 10  Tidal Volume (Vt) 591  Patient Home Equipment No  Auto Titrate No  CPAP/SIPAP surface wiped down Yes   Pt resting comfortably on bipap.  RT will continue to monitor

## 2023-01-06 ENCOUNTER — Other Ambulatory Visit: Payer: Self-pay

## 2023-01-06 DIAGNOSIS — J189 Pneumonia, unspecified organism: Secondary | ICD-10-CM | POA: Diagnosis not present

## 2023-01-06 LAB — BASIC METABOLIC PANEL
Anion gap: 10 (ref 5–15)
BUN: 37 mg/dL — ABNORMAL HIGH (ref 6–20)
CO2: 27 mmol/L (ref 22–32)
Calcium: 10.3 mg/dL (ref 8.9–10.3)
Chloride: 102 mmol/L (ref 98–111)
Creatinine, Ser: 0.65 mg/dL (ref 0.44–1.00)
GFR, Estimated: >60 mL/min (ref >60)
Glucose, Bld: 154 mg/dL — ABNORMAL HIGH (ref 70–99)
Potassium: 4.3 mmol/L (ref 3.5–5.1)
Sodium: 139 mmol/L (ref 135–145)

## 2023-01-06 LAB — CBC
HCT: 30.1 % — ABNORMAL LOW (ref 36.0–46.0)
Hemoglobin: 9.2 g/dL — ABNORMAL LOW (ref 12.0–15.0)
MCH: 25.9 pg — ABNORMAL LOW (ref 26.0–34.0)
MCHC: 30.6 g/dL (ref 30.0–36.0)
MCV: 84.8 fL (ref 80.0–100.0)
Platelets: 146 10*3/uL — ABNORMAL LOW (ref 150–400)
RBC: 3.55 MIL/uL — ABNORMAL LOW (ref 3.87–5.11)
RDW: 16.4 % — ABNORMAL HIGH (ref 11.5–15.5)
WBC: 11.4 10*3/uL — ABNORMAL HIGH (ref 4.0–10.5)
nRBC: 0 % (ref 0.0–0.2)

## 2023-01-06 LAB — GLUCOSE, CAPILLARY
Glucose-Capillary: 141 mg/dL — ABNORMAL HIGH (ref 70–99)
Glucose-Capillary: 153 mg/dL — ABNORMAL HIGH (ref 70–99)
Glucose-Capillary: 137 mg/dL — ABNORMAL HIGH (ref 70–99)
Glucose-Capillary: 171 mg/dL — ABNORMAL HIGH (ref 70–99)
Glucose-Capillary: 244 mg/dL — ABNORMAL HIGH (ref 70–99)

## 2023-01-06 LAB — PHOSPHORUS
Phosphorus: 3.9 mg/dL (ref 2.5–4.6)
Phosphorus: 3.5 mg/dL (ref 2.5–4.6)

## 2023-01-06 LAB — MAGNESIUM
Magnesium: 2.3 mg/dL (ref 1.7–2.4)
Magnesium: 2.3 mg/dL (ref 1.7–2.4)

## 2023-01-06 MED ORDER — CARMEX CLASSIC LIP BALM EX OINT: TOPICAL_OINTMENT | CUTANEOUS | Status: DC | PRN

## 2023-01-06 MED ORDER — TRAVASOL 10 % IV SOLN
INTRAVENOUS | Status: DC
  Filled 2023-01-06: qty 566.4

## 2023-01-06 MED ORDER — TRAVASOL 10 % IV SOLN: INTRAVENOUS | Status: DC

## 2023-01-06 MED ORDER — THIAMINE HCL 100 MG/ML IJ SOLN
100.0000 mg | Freq: Every day | INTRAMUSCULAR | Status: AC
  Administered 2023-01-06 – 2023-01-10 (×5): 100 mg via INTRAVENOUS
  Filled 2023-01-06 (×5): qty 2

## 2023-01-06 NOTE — Progress Notes (Signed)
RT NOTE:  Pt switched to NRB from [facility]. Pt's nose is bleeding and crusty. Pt is mouth breathing. Pt SpO2 improved from 88% to 96% following change.

## 2023-01-06 NOTE — Progress Notes (Signed)
RT note: Bipap on hold at this time due to pt having continued nose bleeds. Pt SPO2 91-96% on NRB no increase WOB or distress noted.

## 2023-01-06 NOTE — Progress Notes (Signed)
Peripherally Inserted Central Catheter Placement  The IV Nurse has discussed with the patient and/or persons authorized to consent for the patient, the purpose of this procedure and the potential benefits and risks involved with this procedure.  The benefits include less needle sticks, lab draws from the catheter, and the patient may be discharged home with the catheter. Risks include, but not limited to, infection, bleeding, blood clot (thrombus formation), and puncture of an artery; nerve damage and irregular heartbeat and possibility to perform a PICC exchange if needed/ordered by physician.  Alternatives to this procedure were also discussed.  Bard Power PICC patient education guide, fact sheet on infection prevention and patient information card has been provided to patient /or left at bedside.    PICC Placement Documentation     R basilic PICC insertion attempt unsuccessful. Line would not advance to SVC. Pt requested to wait before trying on the other side. She has adequate access for current therapy. RN made aware.   Burnard Bunting Chenice 01/06/2023, 11:34 AM
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
## 2023-01-06 NOTE — Progress Notes (Addendum)
PHARMACY - TOTAL PARENTERAL NUTRITION CONSULT NOTE   Indication:  intolerance to enteral nutrition   Patient Measurements: Height: 5\' 5"  (165.1 cm) Weight: 76.4 kg (168 lb 6.9 oz) IBW/kg (Calculated) : 57 TPN AdjBW (KG): 62.6 Body mass index is 28.03 kg/m.  Assessment: 47 years of age female with multiple sclerosis and history of gastric bypass in 2010, admitted this admission for persistent shortness of breath s/p recent COVID pneumonia. Patient was previously on regular diet with variable meal completions (15-70%) but has been poor appetite. Made NPO with high intubation risk this admission. Post-pyloric tube placement attempted 9/6 was unsuccessful. Pharmacy consulted for TPN.  High re-feeding risk.   Glucose / Insulin: CBG <180, moderate SSI (4 units required). Receiving high dose methylprednisolone taper.  Electrolytes: Lytes within normal limits.  Renal: SCr 0.65, BUN 37 (trend up) Hepatic: LFTs wnl.  Intake / Output; MIVF: I/O net even. LBM 8/27. GI Imaging: none since start of TPN GI Surgeries / Procedures: none since start of TPN  Central access: port (will re-attempt PICC) TPN start date: 01/06/23  Nutritional Goals: Goal TPN rate is 80 mL/hr (provides 113 g of protein and 1877 kcals per day)  RD Assessment: Estimated Needs Total Energy Estimated Needs: 1800-2000 Total Protein Estimated Needs: 100-115 grams Total Fluid Estimated Needs: > 1.8 L  Current Nutrition:  NPO and TPN  Plan:  Start TPN at half rate of 40 mL/hr at 1800 - monitor CBGs/electrolytes to determine toleration prior to increasing to goal.  Electrolytes in TPN: Na 13mEq/L, K 84mEq/L, Ca 34mEq/L, Mg 39mEq/L, and Phos 89mmol/L. Cl:Ac 1:1 Add standard MVI and trace elements to TPN Initiate Moderate q4h SSI and adjust as needed  Thiamine 100mg  IV daily x5 days per RD recommendations.  Monitor TPN labs on Mon/Thurs, and daily with start of TPN  Link Snuffer, PharmD, BCPS, BCCCP Please refer to [facility]  for [facility] Pharmacy numbers 01/06/2023,9:43 AM

## 2023-01-07 ENCOUNTER — Inpatient Hospital Stay (HOSPITAL_COMMUNITY): Payer: Medicare HMO

## 2023-01-07 DIAGNOSIS — J189 Pneumonia, unspecified organism: Secondary | ICD-10-CM | POA: Diagnosis not present

## 2023-01-07 LAB — BASIC METABOLIC PANEL
Anion gap: 10 (ref 5–15)
Anion gap: 6 (ref 5–15)
BUN: 42 mg/dL — ABNORMAL HIGH (ref 6–20)
BUN: 40 mg/dL — ABNORMAL HIGH (ref 6–20)
CO2: 25 mmol/L (ref 22–32)
CO2: 29 mmol/L (ref 22–32)
Calcium: 10 mg/dL (ref 8.9–10.3)
Calcium: 10.1 mg/dL (ref 8.9–10.3)
Chloride: 106 mmol/L (ref 98–111)
Chloride: 106 mmol/L (ref 98–111)
Creatinine, Ser: 0.75 mg/dL (ref 0.44–1.00)
Creatinine, Ser: 0.68 mg/dL (ref 0.44–1.00)
GFR, Estimated: >60 mL/min (ref >60)
GFR, Estimated: >60 mL/min (ref >60)
Glucose, Bld: 212 mg/dL — ABNORMAL HIGH (ref 70–99)
Glucose, Bld: 189 mg/dL — ABNORMAL HIGH (ref 70–99)
Potassium: 4.2 mmol/L (ref 3.5–5.1)
Potassium: 4.5 mmol/L (ref 3.5–5.1)
Sodium: 141 mmol/L (ref 135–145)
Sodium: 141 mmol/L (ref 135–145)

## 2023-01-07 LAB — GLUCOSE, CAPILLARY
Glucose-Capillary: 106 mg/dL — ABNORMAL HIGH (ref 70–99)
Glucose-Capillary: 196 mg/dL — ABNORMAL HIGH (ref 70–99)
Glucose-Capillary: 168 mg/dL — ABNORMAL HIGH (ref 70–99)
Glucose-Capillary: 188 mg/dL — ABNORMAL HIGH (ref 70–99)
Glucose-Capillary: 172 mg/dL — ABNORMAL HIGH (ref 70–99)
Glucose-Capillary: 202 mg/dL — ABNORMAL HIGH (ref 70–99)
Glucose-Capillary: 212 mg/dL — ABNORMAL HIGH (ref 70–99)

## 2023-01-07 LAB — CBC
HCT: 32 % — ABNORMAL LOW (ref 36.0–46.0)
Hemoglobin: 9.7 g/dL — ABNORMAL LOW (ref 12.0–15.0)
MCH: 25.9 pg — ABNORMAL LOW (ref 26.0–34.0)
MCHC: 30.3 g/dL (ref 30.0–36.0)
MCV: 85.3 fL (ref 80.0–100.0)
Platelets: 154 10*3/uL (ref 150–400)
RBC: 3.75 MIL/uL — ABNORMAL LOW (ref 3.87–5.11)
RDW: 16.3 % — ABNORMAL HIGH (ref 11.5–15.5)
WBC: 12.3 10*3/uL — ABNORMAL HIGH (ref 4.0–10.5)
nRBC: 0 % (ref 0.0–0.2)

## 2023-01-07 LAB — PHOSPHORUS: Phosphorus: 2.6 mg/dL (ref 2.5–4.6)

## 2023-01-07 MED ORDER — TRAVASOL 10 % IV SOLN
INTRAVENOUS | Status: DC
Start: 2023-01-07 — End: 2023-01-07

## 2023-01-07 MED ORDER — INSULIN ASPART 100 UNIT/ML IJ SOLN
0.0000 [IU] | INTRAMUSCULAR | Status: DC
  Administered 2023-01-07: 4 [IU] via SUBCUTANEOUS
  Administered 2023-01-07: 7 [IU] via SUBCUTANEOUS
  Administered 2023-01-07: 4 [IU] via SUBCUTANEOUS
  Administered 2023-01-07: 7 [IU] via SUBCUTANEOUS
  Administered 2023-01-07: 4 [IU] via SUBCUTANEOUS
  Administered 2023-01-08 (×2): 3 [IU] via SUBCUTANEOUS
  Administered 2023-01-08: 7 [IU] via SUBCUTANEOUS
  Administered 2023-01-08: 4 [IU] via SUBCUTANEOUS
  Administered 2023-01-08: 15 [IU] via SUBCUTANEOUS
  Administered 2023-01-08: 7 [IU] via SUBCUTANEOUS
  Administered 2023-01-09: 11 [IU] via SUBCUTANEOUS
  Administered 2023-01-09: 7 [IU] via SUBCUTANEOUS
  Administered 2023-01-09 (×2): 11 [IU] via SUBCUTANEOUS
  Administered 2023-01-09: 15 [IU] via SUBCUTANEOUS
  Administered 2023-01-09: 7 [IU] via SUBCUTANEOUS
  Administered 2023-01-10 (×2): 4 [IU] via SUBCUTANEOUS
  Administered 2023-01-10: 7 [IU] via SUBCUTANEOUS

## 2023-01-07 MED ORDER — TRAVASOL 10 % IV SOLN
INTRAVENOUS | Status: AC
  Filled 2023-01-07: qty 1132.8

## 2023-01-07 MED ORDER — TRAVASOL 10 % IV SOLN
INTRAVENOUS | Status: AC
  Filled 2023-01-07: qty 566.4

## 2023-01-07 NOTE — Progress Notes (Signed)
PHARMACY - TOTAL PARENTERAL NUTRITION CONSULT NOTE   Indication:  intolerance to enteral nutrition   Patient Measurements: Height: 5\' 5"  (165.1 cm) Weight: 79.4 kg (175 lb 0.7 oz) IBW/kg (Calculated) : 57 TPN AdjBW (KG): 62.6 Body mass index is 29.13 kg/m.  Assessment: 47 years of age female with multiple sclerosis and history of gastric bypass in 2010, admitted this admission for persistent shortness of breath s/p recent COVID pneumonia. Patient was previously on regular diet with variable meal completions (15-70%) but has been poor appetite. Made NPO with high intubation risk this admission. Post-pyloric tube placement attempted 9/6 was unsuccessful. Pharmacy consulted for TPN.  High re-feeding risk.   Glucose / Insulin: CBG 168-212, moderate SSI (19 units required). Receiving high dose methylprednisolone taper >> decreasing from 500 to 80 daily today.  Electrolytes: Lytes within normal limits.  Renal: SCr 0.75, BUN up 42 Hepatic: LFTs wnl.  Intake / Output; MIVF: I/O net even. LBM 8/27. GI Imaging: none since start of TPN GI Surgeries / Procedures: none since start of TPN  Central access: port (will re-attempt PICC) TPN start date: 01/06/23  Nutritional Goals: Goal TPN rate is 80 mL/hr (provides 113 g of protein and 1877 kcals per day)  RD Assessment: Estimated Needs Total Energy Estimated Needs: 1800-2000 Total Protein Estimated Needs: 100-115 grams Total Fluid Estimated Needs: > 1.8 L  Current Nutrition:  NPO and TPN  Plan:  Increase TPN to goal of 80 mL/hr at 1800. Electrolytes in TPN: Na 56mEq/L, K 7mEq/L, Ca 42mEq/L, Mg 30mEq/L, and Phos 41mmol/L. Cl:Ac 1:1 Add standard MVI and trace elements to TPN Increase to  Resistant q4h SSI and adjust as needed  Monitor CBGs as steroid dose decreasing to see if need to add to TPN.  Thiamine 100mg  IV daily x5 days per RD recommendations.  Monitor TPN labs on Mon/Thurs, and daily with start of TPN  Link Snuffer, PharmD, BCPS,  BCCCP Please refer to [facility] for [facility] Pharmacy numbers 01/07/2023,7:21 AM
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
## 2023-01-07 NOTE — Progress Notes (Addendum)
eLink Physician-Brief Progress Note Patient Name: CATHARINE ITKIN DOB: Nov 03, 1975 MRN: [id number]   Date of Service  01/07/2023  HPI/Events of Note  47 year old female with hypertension, hyperlipidemia, hypothyroidism and multiple sclerosis who was recently admitted at [facility] for COVID-pneumonia.  She has had multifocal pneumonia with post-COVID syndrome.  Developed epistaxis yesterday and was unable to utilize BiPAP overnight.  She has had very little progression in her clinical status in the setting of concurrent multiple sclerosis flare, heart failure exacerbation, and intermittent encephalopathy with poor oral intake.  She remains on high flow nasal cannula with humidification and nonrebreather in place.  She remains tachypneic as she has been 4 days she remains tachycardic as she has been intermittently for days.  She is saturating 100% for the time being.  eICU Interventions  Overall very tenuous respiratory status but no clear indication, escalate to invasive mechanical ventilation at this time.  She remains full code.  Would resume BiPAP therapy before initiating mechanical ventilation regardless-both for preoxygenation and to attempt to stave off invasive measures.  No epistaxis thus far today.   2215 -remains tachypneic with tenuous respiratory status, refusing BiPAP.  Given the cognizance of her respiratory status, she does not appear to be more encephalopathic.  Oxygenation is appropriate.  No clear indication for ABG at this time.  Continue observation.  Encourage BiPAP if the patient will tolerate it.  Can likely remove nonrebreather given the patient is saturating well above 90%.  Intervention Category Intermediate Interventions: Respiratory distress - evaluation and management  Aadya Kindler 01/07/2023, 8:36 PM

## 2023-01-07 NOTE — Plan of Care (Signed)
  Problem: Respiratory: Goal: Ability to maintain adequate ventilation will improve Outcome: Progressing   Problem: Respiratory: Goal: Ability to maintain a clear airway will improve Outcome: Progressing   Problem: Education: Goal: Knowledge of General Education information will improve Description: Including pain rating scale, medication(s)/side effects and non-pharmacologic comfort measures Outcome: Progressing   Problem: Activity: Goal: Ability to tolerate increased activity will improve Outcome: Progressing

## 2023-01-08 ENCOUNTER — Inpatient Hospital Stay (HOSPITAL_COMMUNITY): Payer: Medicare HMO

## 2023-01-08 DIAGNOSIS — J189 Pneumonia, unspecified organism: Secondary | ICD-10-CM | POA: Diagnosis not present

## 2023-01-08 DIAGNOSIS — J9601 Acute respiratory failure with hypoxia: Secondary | ICD-10-CM | POA: Diagnosis not present

## 2023-01-08 DIAGNOSIS — M7989 Other specified soft tissue disorders: Secondary | ICD-10-CM | POA: Diagnosis not present

## 2023-01-08 DIAGNOSIS — A419 Sepsis, unspecified organism: Secondary | ICD-10-CM | POA: Diagnosis not present

## 2023-01-08 LAB — POCT I-STAT 7, (LYTES, BLD GAS, ICA,H+H)
Acid-Base Excess: 3 mmol/L — ABNORMAL HIGH (ref 0.0–2.0)
Acid-Base Excess: 1 mmol/L (ref 0.0–2.0)
Acid-Base Excess: 0 mmol/L (ref 0.0–2.0)
Acid-base deficit: 2 mmol/L (ref 0.0–2.0)
Acid-base deficit: 3 mmol/L — ABNORMAL HIGH (ref 0.0–2.0)
Bicarbonate: 23.8 mmol/L (ref 20.0–28.0)
Bicarbonate: 26.8 mmol/L (ref 20.0–28.0)
Bicarbonate: 30.6 mmol/L — ABNORMAL HIGH (ref 20.0–28.0)
Bicarbonate: 27.6 mmol/L (ref 20.0–28.0)
Bicarbonate: 27.7 mmol/L (ref 20.0–28.0)
Calcium, Ion: 1.56 mmol/L (ref 1.15–1.40)
Calcium, Ion: 1.59 mmol/L (ref 1.15–1.40)
Calcium, Ion: 1.51 mmol/L (ref 1.15–1.40)
Calcium, Ion: 1.47 mmol/L — ABNORMAL HIGH (ref 1.15–1.40)
Calcium, Ion: 1.45 mmol/L — ABNORMAL HIGH (ref 1.15–1.40)
HCT: 31 % — ABNORMAL LOW (ref 36.0–46.0)
HCT: 29 % — ABNORMAL LOW (ref 36.0–46.0)
HCT: 24 % — ABNORMAL LOW (ref 36.0–46.0)
HCT: 26 % — ABNORMAL LOW (ref 36.0–46.0)
HCT: 29 % — ABNORMAL LOW (ref 36.0–46.0)
Hemoglobin: 10.5 g/dL — ABNORMAL LOW (ref 12.0–15.0)
Hemoglobin: 9.9 g/dL — ABNORMAL LOW (ref 12.0–15.0)
Hemoglobin: 8.2 g/dL — ABNORMAL LOW (ref 12.0–15.0)
Hemoglobin: 8.8 g/dL — ABNORMAL LOW (ref 12.0–15.0)
Hemoglobin: 9.9 g/dL — ABNORMAL LOW (ref 12.0–15.0)
O2 Saturation: 91 %
O2 Saturation: 88 %
O2 Saturation: 94 %
O2 Saturation: 95 %
O2 Saturation: 96 %
Patient temperature: 97.6
Patient temperature: 98.8
Patient temperature: 98.1
Patient temperature: 97.6
Patient temperature: 97.9
Potassium: 4.9 mmol/L (ref 3.5–5.1)
Potassium: 4.4 mmol/L (ref 3.5–5.1)
Potassium: 3.9 mmol/L (ref 3.5–5.1)
Potassium: 4.5 mmol/L (ref 3.5–5.1)
Potassium: 4.8 mmol/L (ref 3.5–5.1)
Sodium: 140 mmol/L (ref 135–145)
Sodium: 141 mmol/L (ref 135–145)
Sodium: 143 mmol/L (ref 135–145)
Sodium: 139 mmol/L (ref 135–145)
Sodium: 137 mmol/L (ref 135–145)
TCO2: 25 mmol/L (ref 22–32)
TCO2: 29 mmol/L (ref 22–32)
TCO2: 33 mmol/L — ABNORMAL HIGH (ref 22–32)
TCO2: 29 mmol/L (ref 22–32)
TCO2: 29 mmol/L (ref 22–32)
pCO2 arterial: 42.3 mmHg (ref 32–48)
pCO2 arterial: 74.7 mmHg (ref 32–48)
pCO2 arterial: 66.5 mmHg (ref 32–48)
pCO2 arterial: 52.6 mmHg — ABNORMAL HIGH (ref 32–48)
pCO2 arterial: 56.6 mmHg — ABNORMAL HIGH (ref 32–48)
pH, Arterial: 7.357 (ref 7.35–7.45)
pH, Arterial: 7.163 — CL (ref 7.35–7.45)
pH, Arterial: 7.269 — ABNORMAL LOW (ref 7.35–7.45)
pH, Arterial: 7.326 — ABNORMAL LOW (ref 7.35–7.45)
pH, Arterial: 7.296 — ABNORMAL LOW (ref 7.35–7.45)
pO2, Arterial: 62 mmHg — ABNORMAL LOW (ref 83–108)
pO2, Arterial: 72 mmHg — ABNORMAL LOW (ref 83–108)
pO2, Arterial: 85 mmHg (ref 83–108)
pO2, Arterial: 82 mmHg — ABNORMAL LOW (ref 83–108)
pO2, Arterial: 89 mmHg (ref 83–108)

## 2023-01-08 LAB — COMPREHENSIVE METABOLIC PANEL
ALT: 20 U/L (ref 0–44)
AST: 29 U/L (ref 15–41)
Albumin: 1.8 g/dL — ABNORMAL LOW (ref 3.5–5.0)
Alkaline Phosphatase: 130 U/L — ABNORMAL HIGH (ref 38–126)
Anion gap: 9 (ref 5–15)
BUN: 36 mg/dL — ABNORMAL HIGH (ref 6–20)
CO2: 34 mmol/L — ABNORMAL HIGH (ref 22–32)
Calcium: 9.4 mg/dL (ref 8.9–10.3)
Chloride: 104 mmol/L (ref 98–111)
Creatinine, Ser: 0.59 mg/dL (ref 0.44–1.00)
GFR, Estimated: >60 mL/min (ref >60)
Glucose, Bld: 202 mg/dL — ABNORMAL HIGH (ref 70–99)
Potassium: 3.6 mmol/L (ref 3.5–5.1)
Sodium: 147 mmol/L — ABNORMAL HIGH (ref 135–145)
Total Bilirubin: 0.4 mg/dL (ref 0.3–1.2)
Total Protein: 3.4 g/dL — ABNORMAL LOW (ref 6.5–8.1)

## 2023-01-08 LAB — CBC
HCT: 26.4 % — ABNORMAL LOW (ref 36.0–46.0)
Hemoglobin: 8.1 g/dL — ABNORMAL LOW (ref 12.0–15.0)
MCH: 26.7 pg (ref 26.0–34.0)
MCHC: 30.7 g/dL (ref 30.0–36.0)
MCV: 87.1 fL (ref 80.0–100.0)
Platelets: 115 K/uL — ABNORMAL LOW (ref 150–400)
RBC: 3.03 MIL/uL — ABNORMAL LOW (ref 3.87–5.11)
RDW: 16.6 % — ABNORMAL HIGH (ref 11.5–15.5)
WBC: 20.3 K/uL — ABNORMAL HIGH (ref 4.0–10.5)
nRBC: 0.1 % (ref 0.0–0.2)

## 2023-01-08 LAB — GLUCOSE, CAPILLARY
Glucose-Capillary: 238 mg/dL — ABNORMAL HIGH (ref 70–99)
Glucose-Capillary: 138 mg/dL — ABNORMAL HIGH (ref 70–99)
Glucose-Capillary: 122 mg/dL — ABNORMAL HIGH (ref 70–99)
Glucose-Capillary: 153 mg/dL — ABNORMAL HIGH (ref 70–99)
Glucose-Capillary: 210 mg/dL — ABNORMAL HIGH (ref 70–99)
Glucose-Capillary: 229 mg/dL — ABNORMAL HIGH (ref 70–99)
Glucose-Capillary: 346 mg/dL — ABNORMAL HIGH (ref 70–99)

## 2023-01-08 LAB — HEPARIN LEVEL (UNFRACTIONATED): Heparin Unfractionated: >1.1 [IU]/mL — ABNORMAL HIGH (ref 0.30–0.70)

## 2023-01-08 LAB — TRIGLYCERIDES: Triglycerides: 290 mg/dL — ABNORMAL HIGH (ref <150)

## 2023-01-08 LAB — PROCALCITONIN: Procalcitonin: 1.51 ng/mL

## 2023-01-08 LAB — TROPONIN I (HIGH SENSITIVITY)
Troponin I (High Sensitivity): 87 ng/L — ABNORMAL HIGH (ref <18)
Troponin I (High Sensitivity): 125 ng/L (ref <18)

## 2023-01-08 LAB — ABO/RH: ABO/RH(D): O POS

## 2023-01-08 LAB — MAGNESIUM: Magnesium: 2 mg/dL (ref 1.7–2.4)

## 2023-01-08 LAB — PHOSPHORUS: Phosphorus: 2.4 mg/dL — ABNORMAL LOW (ref 2.5–4.6)

## 2023-01-08 LAB — LACTIC ACID, PLASMA
Lactic Acid, Venous: 2.9 mmol/L (ref 0.5–1.9)
Lactic Acid, Venous: 1.5 mmol/L (ref 0.5–1.9)

## 2023-01-08 LAB — APTT: aPTT: >200 s (ref 24–36)

## 2023-01-08 MED ORDER — LACTATED RINGERS IV BOLUS
1000.0000 mL | Freq: Once | INTRAVENOUS | Status: AC
  Administered 2023-01-08: 1000 mL via INTRAVENOUS

## 2023-01-08 MED ORDER — DEXTROSE 10 % IV SOLN: INTRAVENOUS | Status: DC

## 2023-01-08 MED ORDER — SUCCINYLCHOLINE CHLORIDE 200 MG/10ML IV SOSY
PREFILLED_SYRINGE | INTRAVENOUS | Status: AC
  Filled 2023-01-08: qty 10

## 2023-01-08 MED ORDER — VANCOMYCIN HCL IN DEXTROSE 1-5 GM/200ML-% IV SOLN
1000.0000 mg | Freq: Two times a day (BID) | INTRAVENOUS | Status: DC
  Administered 2023-01-08 – 2023-01-09 (×2): 1000 mg via INTRAVENOUS
  Filled 2023-01-08 (×3): qty 200

## 2023-01-08 MED ORDER — AMIODARONE HCL IN DEXTROSE 360-4.14 MG/200ML-% IV SOLN
30.0000 mg/h | INTRAVENOUS | Status: DC
  Administered 2023-01-08: 30 mg/h via INTRAVENOUS

## 2023-01-08 MED ORDER — FENTANYL CITRATE PF 50 MCG/ML IJ SOSY
PREFILLED_SYRINGE | INTRAMUSCULAR | Status: AC
  Filled 2023-01-08: qty 1

## 2023-01-08 MED ORDER — SODIUM CHLORIDE 0.9 % IV SOLN
INTRAVENOUS | Status: DC | PRN
  Administered 2023-01-08: 1 mL via INTRAVENOUS

## 2023-01-08 MED ORDER — FENTANYL CITRATE PF 50 MCG/ML IJ SOSY
PREFILLED_SYRINGE | INTRAMUSCULAR | Status: AC
  Administered 2023-01-08: 100 ug via INTRAVENOUS
  Filled 2023-01-08: qty 2

## 2023-01-08 MED ORDER — POTASSIUM CHLORIDE 20 MEQ PO PACK
40.0000 meq | PACK | Freq: Once | ORAL | Status: AC
  Administered 2023-01-08: 40 meq
  Filled 2023-01-08: qty 2

## 2023-01-08 MED ORDER — ETOMIDATE 2 MG/ML IV SOLN
INTRAVENOUS | Status: AC
  Administered 2023-01-08: 20 mg via INTRAVENOUS
  Filled 2023-01-08: qty 20

## 2023-01-08 MED ORDER — PROPOFOL 1000 MG/100ML IV EMUL
INTRAVENOUS | Status: AC
  Filled 2023-01-08: qty 100

## 2023-01-08 MED ORDER — HYDROXYZINE HCL 25 MG PO TABS: 25.0000 mg | ORAL_TABLET | Freq: Four times a day (QID) | ORAL | Status: DC | PRN

## 2023-01-08 MED ORDER — PIVOT 1.5 CAL PO LIQD
1000.0000 mL | ORAL | Status: DC
  Administered 2023-01-08: 1000 mL
  Filled 2023-01-08: qty 1000

## 2023-01-08 MED ORDER — VASOPRESSIN 20 UNITS/100 ML INFUSION FOR SHOCK
0.0400 [IU]/min | INTRAVENOUS | Status: DC
  Administered 2023-01-08 – 2023-01-09 (×2): 0.04 [IU]/min via INTRAVENOUS
  Filled 2023-01-08 (×3): qty 100

## 2023-01-08 MED ORDER — HYDROXYZINE HCL 25 MG PO TABS
25.0000 mg | ORAL_TABLET | Freq: Four times a day (QID) | ORAL | Status: DC | PRN
  Administered 2023-01-08: 25 mg
  Filled 2023-01-08: qty 1

## 2023-01-08 MED ORDER — AMIODARONE HCL IN DEXTROSE 360-4.14 MG/200ML-% IV SOLN
60.0000 mg/h | INTRAVENOUS | Status: AC
  Administered 2023-01-08 (×3): 60 mg/h via INTRAVENOUS
  Filled 2023-01-08 (×3): qty 200

## 2023-01-08 MED ORDER — ALBUMIN HUMAN 25 % IV SOLN
25.0000 g | Freq: Once | INTRAVENOUS | Status: AC
  Administered 2023-01-08: 25 g via INTRAVENOUS
  Filled 2023-01-08: qty 100

## 2023-01-08 MED ORDER — MAGNESIUM SULFATE 2 GM/50ML IV SOLN
2.0000 g | Freq: Once | INTRAVENOUS | Status: AC
  Administered 2023-01-08: 2 g via INTRAVENOUS
  Filled 2023-01-08: qty 50

## 2023-01-08 MED ORDER — FUROSEMIDE 10 MG/ML IJ SOLN
40.0000 mg | Freq: Once | INTRAMUSCULAR | Status: AC
  Administered 2023-01-08: 40 mg via INTRAVENOUS
  Filled 2023-01-08: qty 4

## 2023-01-08 MED ORDER — FENTANYL BOLUS VIA INFUSION
50.0000 ug | INTRAVENOUS | Status: DC | PRN
  Administered 2023-01-10: 50 ug via INTRAVENOUS

## 2023-01-08 MED ORDER — DIGOXIN 0.25 MG/ML IJ SOLN
0.2500 mg | Freq: Four times a day (QID) | INTRAMUSCULAR | Status: AC
  Administered 2023-01-08 (×2): 0.25 mg via INTRAVENOUS
  Filled 2023-01-08 (×2): qty 1

## 2023-01-08 MED ORDER — FENTANYL CITRATE PF 50 MCG/ML IJ SOSY: 50.0000 ug | PREFILLED_SYRINGE | Freq: Once | INTRAMUSCULAR | Status: AC

## 2023-01-08 MED ORDER — ORAL CARE MOUTH RINSE: 15.0000 mL | OROMUCOSAL | Status: DC | PRN

## 2023-01-08 MED ORDER — FENTANYL 2500MCG IN NS 250ML (10MCG/ML) PREMIX INFUSION
0.0000 ug/h | INTRAVENOUS | Status: DC
  Administered 2023-01-08: 50 ug/h via INTRAVENOUS
  Administered 2023-01-08: 250 ug/h via INTRAVENOUS
  Administered 2023-01-09: 150 ug/h via INTRAVENOUS
  Administered 2023-01-10: 100 ug/h via INTRAVENOUS
  Filled 2023-01-08 (×4): qty 250

## 2023-01-08 MED ORDER — ROCURONIUM BROMIDE 10 MG/ML (PF) SYRINGE
PREFILLED_SYRINGE | INTRAVENOUS | Status: AC
  Administered 2023-01-08: 50 mg via INTRAVENOUS
  Filled 2023-01-08: qty 10

## 2023-01-08 MED ORDER — HEPARIN (PORCINE) 25000 UT/250ML-% IV SOLN
1100.0000 [IU]/h | INTRAVENOUS | Status: DC
  Filled 2023-01-08: qty 250

## 2023-01-08 MED ORDER — ETOMIDATE 2 MG/ML IV SOLN: 20.0000 mg | Freq: Once | INTRAVENOUS | Status: AC

## 2023-01-08 MED ORDER — FENTANYL CITRATE PF 50 MCG/ML IJ SOSY: 100.0000 ug | PREFILLED_SYRINGE | Freq: Once | INTRAMUSCULAR | Status: AC

## 2023-01-08 MED ORDER — PROPOFOL 1000 MG/100ML IV EMUL
5.0000 ug/kg/min | INTRAVENOUS | Status: DC
  Administered 2023-01-08 (×2): 50 ug/kg/min via INTRAVENOUS
  Administered 2023-01-08: 40 ug/kg/min via INTRAVENOUS
  Administered 2023-01-09 – 2023-01-10 (×5): 30 ug/kg/min via INTRAVENOUS
  Administered 2023-01-10: 20 ug/kg/min via INTRAVENOUS
  Administered 2023-01-10: 25 ug/kg/min via INTRAVENOUS
  Filled 2023-01-08 (×5): qty 100
  Filled 2023-01-08: qty 200
  Filled 2023-01-08 (×2): qty 100

## 2023-01-08 MED ORDER — AMIODARONE IV BOLUS ONLY 150 MG/100ML
150.0000 mg | Freq: Once | INTRAVENOUS | Status: AC
  Administered 2023-01-08: 150 mg via INTRAVENOUS

## 2023-01-08 MED ORDER — POTASSIUM PHOSPHATES 15 MMOLE/5ML IV SOLN
15.0000 mmol | Freq: Once | INTRAVENOUS | Status: AC
  Administered 2023-01-08: 15 mmol via INTRAVENOUS
  Filled 2023-01-08: qty 5

## 2023-01-08 MED ORDER — PIPERACILLIN-TAZOBACTAM 3.375 G IVPB
3.3750 g | Freq: Three times a day (TID) | INTRAVENOUS | Status: DC
  Administered 2023-01-08 – 2023-01-10 (×8): 3.375 g via INTRAVENOUS
  Filled 2023-01-08 (×10): qty 50

## 2023-01-08 MED ORDER — AMIODARONE LOAD VIA INFUSION: 150.0000 mg | Freq: Once | INTRAVENOUS | Status: DC

## 2023-01-08 MED ORDER — ADENOSINE 6 MG/2ML IV SOLN
INTRAVENOUS | Status: AC
  Administered 2023-01-08: 6 mg via INTRAVENOUS
  Filled 2023-01-08: qty 2

## 2023-01-08 MED ORDER — SODIUM BICARBONATE 8.4 % IV SOLN
100.0000 meq | Freq: Once | INTRAVENOUS | Status: AC
  Administered 2023-01-08: 100 meq via INTRAVENOUS
  Filled 2023-01-08: qty 50

## 2023-01-08 MED ORDER — LORAZEPAM 2 MG/ML IJ SOLN
INTRAMUSCULAR | Status: AC
  Filled 2023-01-08: qty 1

## 2023-01-08 MED ORDER — DIGOXIN 0.25 MG/ML IJ SOLN
0.5000 mg | Freq: Once | INTRAMUSCULAR | Status: AC
  Administered 2023-01-08: 0.5 mg via INTRAVENOUS
  Filled 2023-01-08: qty 2

## 2023-01-08 MED ORDER — LORAZEPAM 2 MG/ML IJ SOLN
0.5000 mg | Freq: Four times a day (QID) | INTRAMUSCULAR | Status: DC | PRN
  Administered 2023-01-08: 0.5 mg via INTRAVENOUS

## 2023-01-08 MED ORDER — KETAMINE HCL 50 MG/5ML IJ SOSY
PREFILLED_SYRINGE | INTRAMUSCULAR | Status: AC
  Filled 2023-01-08: qty 10

## 2023-01-08 MED ORDER — MIDAZOLAM HCL 2 MG/2ML IJ SOLN: 2.0000 mg | Freq: Once | INTRAMUSCULAR | Status: AC

## 2023-01-08 MED ORDER — ROCURONIUM BROMIDE 50 MG/5ML IV SOLN: 50.0000 mg | Freq: Once | INTRAVENOUS | Status: AC

## 2023-01-08 MED ORDER — FENTANYL CITRATE PF 50 MCG/ML IJ SOSY
PREFILLED_SYRINGE | INTRAMUSCULAR | Status: AC
  Administered 2023-01-08: 50 ug via INTRAVENOUS
  Filled 2023-01-08: qty 1

## 2023-01-08 MED ORDER — VANCOMYCIN HCL 1500 MG/300ML IV SOLN
1500.0000 mg | Freq: Once | INTRAVENOUS | Status: AC
  Administered 2023-01-08: 1500 mg via INTRAVENOUS
  Filled 2023-01-08: qty 300

## 2023-01-08 MED ORDER — DIGOXIN 0.25 MG/ML IJ SOLN
0.2500 mg | Freq: Every day | INTRAMUSCULAR | Status: DC
  Administered 2023-01-09: 0.25 mg via INTRAVENOUS
  Filled 2023-01-08: qty 1

## 2023-01-08 MED ORDER — ORAL CARE MOUTH RINSE
15.0000 mL | OROMUCOSAL | Status: DC
  Administered 2023-01-08 – 2023-01-10 (×26): 15 mL via OROMUCOSAL

## 2023-01-08 MED ORDER — HEPARIN (PORCINE) 25000 UT/250ML-% IV SOLN
1300.0000 [IU]/h | INTRAVENOUS | Status: DC
  Administered 2023-01-08: 1300 [IU]/h via INTRAVENOUS
  Filled 2023-01-08: qty 250

## 2023-01-08 MED ORDER — ADENOSINE 6 MG/2ML IV SOLN
INTRAVENOUS | Status: AC
  Filled 2023-01-08: qty 2

## 2023-01-08 MED ORDER — AMIODARONE IV BOLUS ONLY 150 MG/100ML
150.0000 mg | Freq: Once | INTRAVENOUS | Status: AC
  Administered 2023-01-08: 150 mg via INTRAVENOUS
  Filled 2023-01-08: qty 100

## 2023-01-08 MED ORDER — SODIUM BICARBONATE 8.4 % IV SOLN
INTRAVENOUS | Status: AC
  Filled 2023-01-08: qty 50

## 2023-01-08 MED ORDER — ORAL CARE MOUTH RINSE
15.0000 mL | OROMUCOSAL | Status: DC
  Administered 2023-01-08 (×2): 15 mL via OROMUCOSAL

## 2023-01-08 MED ORDER — MIDAZOLAM HCL 2 MG/2ML IJ SOLN
INTRAMUSCULAR | Status: AC
  Administered 2023-01-08: 2 mg via INTRAVENOUS
  Filled 2023-01-08: qty 2

## 2023-01-08 MED ORDER — ADENOSINE 6 MG/2ML IV SOLN: 6.0000 mg | Freq: Once | INTRAVENOUS | Status: AC

## 2023-01-08 MED ORDER — AMIODARONE IV BOLUS ONLY 150 MG/100ML
INTRAVENOUS | Status: AC
  Filled 2023-01-08: qty 100

## 2023-01-08 MED ORDER — TRAVASOL 10 % IV SOLN
INTRAVENOUS | Status: AC
  Filled 2023-01-08: qty 1154.4

## 2023-01-08 NOTE — Progress Notes (Signed)
Nutrition Follow-up  DOCUMENTATION CODES:  Non-severe (moderate) malnutrition in context of acute illness/injury  INTERVENTION:  Once cortrak advanced out of gastric pouch, recommend initiate tube feeding via cortrak: Pivot 1.5 at 50 ml/h (1200 ml per day) Start at 20mL and advance q8h to goal of 13mL/h Provides 1800 kcal, 113 gm protein, 900 ml free water daily If unable to advance cortrak out of gastric pouch, recommend consult to fluoroscopy to advance tube into small bowel 1 packet Juven BID, each packet provides 95 calories, 2.5 grams of protein (collagen), and 9.8 grams of carbohydrate (3 grams sugar); also contains 7 grams of L-arginine and L-glutamine, 300 mg vitamin C, 15 mg vitamin E, 1.2 mcg vitamin B-12, 9.5 mg zinc, 200 mg calcium, and 1.5 g  Calcium Beta-hydroxy-Beta-methylbutyrate to support wound healing TPN management per pharmacy, if TF tolerated, would recommend weaning 9/10  NUTRITION DIAGNOSIS:  Moderate Malnutrition related to acute illness as evidenced by mild muscle depletion, mild fat depletion, energy intake < 75% for > 7 days. - remains applicable  GOAL:  Patient will meet greater than or equal to 90% of their needs - progressing, being met with TPN  MONITOR:  TF tolerance, Vent status, Labs, Weight trends, I & O's  REASON FOR ASSESSMENT:  Other (Comment)    ASSESSMENT:  Pt with hx of MS, HTN, HLD, GERD, hx of cancer 2020 with port in place, and gastric bypass 2010 presented to ED with SOB.   8/28 - transferred from [facility] to [facility], placed on Bi-pap 9/6 - cortrak tube placed (gastric) 9/7 - TPN initiated 9/9 - intubated   Patient is currently intubated on ventilator support. Family at bedside able to provide some nutrition hx. States at home, appetite is generally good and he thinks that pt is still taking her bariatric MVI regimen.  Pt intubated early this AM due to worsening respiratory status. Cortrak remains in place at this time in gastric pouch. TPN  started over the weekend due to inability to feed in pouch while on BiPAP. TPN held overnight when pt was intubated, will restart tonight. Pt showing signs of refeeding but electrolytes being replaced.   Discussed with MD, cortrak team attempted x 2 on 9/6 and again today to advance tube out of gastric pouch but was unsuccessful. Consult to fluoroscopy placed to advance tube so pt can receive her full nutrition and TPN can be discontinued.   MV: 8.7 L/min Temp (24hrs), Avg:97.3 F (36.3 C), Min:96.4 F (35.8 C), Max:97.9 F (36.6 C)  Propofol: 19.06 ml/hr (503 kcal/d at current infusion rate)   Intake/Output Summary (Last 24 hours) at 01/08/2023 1350 Last data filed at 01/08/2023 1300 Gross per 24 hour  Intake 4725.61 ml  Output 1055 ml  Net 3670.61 ml  Net IO Since Admission: -5,198.1 mL [01/08/23 1350]  Admit Weight: 79.2 kg Current Weight: 79.4 kg  Average Meal Intake: 8/28-9/2: 15-70% intake x 5 recorded meals prior to intubation  Nutritionally Relevant Medications: Scheduled Meds:  chlorpheniramine-HYDROcodone  5 mL Q12H   dextromethorphan  30 mg BID   digoxin  0.25 mg Q6H   insulin aspart  0-20 Units Q4H   levothyroxine  75 mcg Q0600   methylPREDNISolone (SOLU-MEDROL) injection  80 mg Daily   pantoprazole (PROTONIX) IV  40 mg Q12H   polyethylene glycol  17 g Daily   senna-docusate  1 tablet BID   thiamine (VITAMIN B1) injection  100 mg Daily   Continuous Infusions:  norepinephrine (LEVOPHED) Adult infusion 14 mcg/min (01/08/23  1206)   piperacillin-tazobactam (ZOSYN)  IV Stopped (01/08/23 0903)   potassium PHOSPHATE IVPB (in mmol) 43 mL/hr at 01/08/23 1200   propofol (DIPRIVAN) infusion 40 mcg/kg/min (01/08/23 1200)   TPN ADULT (ION) Stopped (01/08/23 0240)   TPN ADULT (ION)     vancomycin     Labs Reviewed: BUN 36 Phosphorus 2.4 CBG ranges from 138-238 mg/dL over the last 24 hours HgbA1c 6.4% Triglycerides 290  NUTRITION - FOCUSED PHYSICAL EXAM:  Flowsheet  Row Most Recent Value  Orbital Region Mild depletion  Upper Arm Region No depletion  Thoracic and Lumbar Region No depletion  Buccal Region Mild depletion  Temple Region Moderate depletion  Clavicle Bone Region No depletion  Clavicle and Acromion Bone Region No depletion  Scapular Bone Region No depletion  Dorsal Hand Mild depletion  Patellar Region No depletion  Anterior Thigh Region No depletion  Posterior Calf Region No depletion  Edema (RD Assessment) Mild  [generalized]  Hair Reviewed  Eyes Reviewed  Mouth Reviewed  Skin Reviewed  Nails Reviewed   Diet Order:   Diet Order             Diet NPO time specified  Diet effective now                   EDUCATION NEEDS:  No education needs have been identified at this time  Skin: Skin Integrity Issues: Unstageable: - sacrum 4 x 10 cm  Last BM:  9/7, type 7  Height:  Ht Readings from Last 1 Encounters:  12/19/2022 5\' 5"  (1.651 m)    Weight:  Wt Readings from Last 1 Encounters:  01/07/23 79.4 kg    Ideal Body Weight:  56.8 kg  BMI:  Body mass index is 29.13 kg/m.  Estimated Nutritional Needs:  Kcal:  1800-2000 Protein:  100-115 grams Fluid:  1.8-2L/d    Greig Castilla, RD, LDN Clinical Dietitian RD pager # available in AMION  After hours/weekend pager # available in [facility]
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
## 2023-01-08 NOTE — Progress Notes (Signed)
Cortrak Tube Team Note:  Consult received to try and advance Cortrak tube out of the pouch into bowel. Patient with history of gastric bypass.   Tube advanced to 73cm. X-ray is required, abdominal x-ray has been ordered by the Cortrak team to assess position. Please confirm tube placement before using the Cortrak tube.   If the tube becomes dislodged please keep the tube and contact the Cortrak team at [email or web address] for replacement.  If after hours and replacement cannot be delayed, place a NG tube and confirm placement with an abdominal x-ray.    Shelle Iron RD, LDN For contact information, refer to [facility].

## 2023-01-08 NOTE — Progress Notes (Signed)
Seen and agree with D/C from services at this time until medically appropriate Amber Williams, PT Acute Rehabilitation Services Office: [phone]   PT Cancellation Note  Patient Details Name: Amber Williams MRN: [id number] DOB: 09/13/1975   Cancelled Treatment:    Reason Eval/Treat Not Completed: (P) Medical issues which prohibited therapy. Pt is not medically approrpaite for acute therapy at this time. PT to sign off, please re-consult when pt is able to participate.  Amber Williams. PTA Acute Rehabilitation Services Office: [phone]    Amber Williams 01/08/2023, 12:43 PM

## 2023-01-08 NOTE — Progress Notes (Addendum)
Bedside echo profoundly reduced function, shock state Pushed more amio, bicarb and attempted cardioversion x 2, unsuccessful Adenosine to be tried next. If no luck, unfortunately stuck with pressors Crash cart outside of room.  [name] MD PCCM

## 2023-01-08 NOTE — Progress Notes (Signed)
Inpatient Rehab Admissions Coordinator:    Pt. On vent, not appropriate for CIR at this time.   Megan Salon, MS, CCC-SLP Rehab Admissions Coordinator  [phone] (celll) [phone] (office)

## 2023-01-08 NOTE — Progress Notes (Signed)
OT Cancellation Note  Patient Details Name: Amber Williams MRN: [id number] DOB: 1976/02/24   Cancelled Treatment:    Reason Eval/Treat Not Completed: Patient not medically ready;Medical issues which prohibited therapy (Pt is not medically approrpaite for acute therapy at this time. OT to sign off, please re-consult when pt is able to participate.)  Donia Pounds 01/08/2023, 11:49 AM

## 2023-01-08 NOTE — Progress Notes (Signed)
ABG results given to Dr. Merrily Pew. Verbal order received to increase RR to 34. RT will continue to monitor and be available a needed.

## 2023-01-08 NOTE — Progress Notes (Signed)
Bilateral lower extremity venous duplex has been completed. Preliminary results can be found in CV Proc through chart review.   01/08/23 3:34 PM Olen Cordial RVT

## 2023-01-08 NOTE — Progress Notes (Signed)
[facility] ADULT ICU REPLACEMENT PROTOCOL   The patient does apply for the [facility] Adult ICU Electrolyte Replacment Protocol based on the criteria listed below:   1.Exclusion criteria: TCTS, ECMO, Dialysis, and Myasthenia Gravis patients 2. Is GFR >/= 30 ml/min? Yes.    Patient's GFR today is >60 3. Is SCr </= 2? Yes.   Patient's SCr is 0.59 mg/dL 4. Did SCr increase >/= 0.5 in 24 hours? No. 5.Pt's weight >40kg  Yes.   6. Abnormal electrolyte(s): K  7. Electrolytes replaced per protocol 8.  Call MD STAT for K+ </= 2.5, Phos </= 1, or Mag </= 1 Physician:  Verity Overcash Wilbon Obenchain 01/08/2023 6:26 AM
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
## 2023-01-08 NOTE — Progress Notes (Signed)
PHARMACY - TOTAL PARENTERAL NUTRITION CONSULT NOTE   Indication:  intolerance to enteral nutrition   Patient Measurements: Height: 5\' 5"  (165.1 cm) Weight: 79.4 kg (175 lb 0.7 oz) IBW/kg (Calculated) : 57 TPN AdjBW (KG): 62.6 Body mass index is 29.13 kg/m.  Assessment: 47 years of age female with multiple sclerosis and history of gastric bypass in 2010, admitted this admission for persistent shortness of breath s/p recent COVID pneumonia. Patient was previously on regular diet with variable meal completions (15-70%) but has been poor appetite. Made NPO with high intubation risk this admission. Post-pyloric tube placement attempted 9/6 was unsuccessful. Pharmacy consulted for TPN.  High re-feeding risk.   Overnight: TPN disconnected and started on D10 at 80 ml/hr which was subsequently stopped by Dr. Merrily Pew. Plan to resume TPN this PM. Afib and code overnight requiring 2 shocks, bicarb, amio, and digoxin.   Glucose / Insulin: CBG 168-212, moderate SSI (19 units required). Receiving high dose methylprednisolone taper.  Electrolytes: K 3.9, Phos down 2.4 (in setting of elevated Ca)-replaced, Mg 2. CO2 up 34, CL 104, iCa elevated 1.5. Renal: SCr 0.59, BUN down 36 Hepatic: LFTs wnl. TG elevated 290 - had started propofol overnight.  Intake / Output; MIVF: I/O net even. LBM 8/27. GI Imaging: none since start of TPN GI Surgeries / Procedures: none since start of TPN  Central access: port (will re-attempt PICC) TPN start date: 01/06/23  Nutritional Goals: Goal TPN rate is 80 mL/hr (provides 113 g of protein and 1877 kcals per day)  RD Assessment: Estimated Needs Total Energy Estimated Needs: 1800-2000 Total Protein Estimated Needs: 100-115 grams Total Fluid Estimated Needs: > 1.8 L  Current Nutrition:  NPO and TPN Propofol at 50 mcg/kg/min (~24 ml/hr; 576 kcals/day).   Plan:  Removing lipids due to high rate of Propofol and high TG.  Adjust TPN to 65 ml/hr with removal of lipids -  provides 115g protein and 1257 kcals - with propofol will provide 1833 kcals  per day - meeting 100% goal needs.  Electrolytes in TPN: Na 49mEq/L, K 73mEq/L, remove Ca, Mg 71mEq/L, and Phos 95mmol/L. Maximize Cl. Add standard MVI and trace elements to TPN Continue  Resistant q4h SSI and adjust as needed  Monitor CBGs as steroid dose decreasing to see if need to add to TPN.  Thiamine 100mg  IV daily x5 days per RD recommendations.  Monitor TPN labs on Mon/Thurs, and daily with start of TPN  KPhos 15 mmol x1.   Link Snuffer, PharmD, BCPS, BCCCP Please refer to [facility] for [facility] Pharmacy numbers 01/08/2023,7:08 AM
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
## 2023-01-08 NOTE — Progress Notes (Signed)
Pt in unstable SVT. Per Dr. Katrinka Blazing, cardioverted x2.   [phone] shocked at 200 joules. No change in HR/rhythm. 0427 shocked at 200 joules. No change in HR/rhythm.   0437 adenosine 6mg  IVP. HR dropped immediately, however quickly increased back to 130s afib. Orders to continue amiodarone and levophed gtts.

## 2023-01-08 NOTE — Progress Notes (Signed)
Added dose of digoxin Repeat prn depending on renal function

## 2023-01-08 NOTE — Progress Notes (Signed)
eLink Physician-Brief Progress Note Patient Name: Amber Williams DOB: 10-Jun-1975 MRN: [id number]   Date of Service  01/08/2023  HPI/Events of Note  47 year old with acute hypoxic hypercapnic respiratory failure in the setting of post COVID-pneumonia.  She had worsening respiratory failure yesterday and got intubated and now is in septic shock on vasopressor support.  Has escalating norepinephrine requirements-currently on 30 mcg.  She had a turn and had a sudden drop in blood pressures.  eICU Interventions  Significant pulse pressure variability on arterial waveform.  Placed the patient in Trendelenburg with significant improvement in blood pressure-roughly 10% MAP improvement.  We will initiate vasopressin, maintain norepinephrine.  Add albumin 25% bolus.  She is net +2.7 L for the past day and +1.2 L thus far today.  Overall net -4.5 L for the admission.     0303 - Air leak air leak through atrium intermittently on the higher end 3-4 rather than 1.  Persistent airleak noted in the chest tube.  No intervention indicated at this time.  Otherwise mechanically ventilated with stable settings  Intervention Category Major Interventions: Shock - evaluation and management  Yolanda Dockendorf 01/08/2023, 9:59 PM

## 2023-01-08 NOTE — Progress Notes (Signed)
Pharmacy Antibiotic Note  Amber Williams is a 47 y.o. female admitted on 12/09/2022 with pneumonia and sepsis.  Pharmacy has been consulted for Vancomycin/Zosyn dosing. WBC mildly elevated. Unstable overnight.   Plan: Vancomycin 1500 mg IV x 1, then 1000 mg IV q12h >>>Estimated AUC: 502 Zosyn 3.375G IV q8h to be infused over 4 hours Trend WBC, temp, renal function  F/U infectious work-up Drug levels as indicated   Height: 5\' 5"  (165.1 cm) Weight: 79.4 kg (175 lb 0.7 oz) IBW/kg (Calculated) : 57  Temp (24hrs), Avg:97.2 F (36.2 C), Min:96.4 F (35.8 C), Max:97.8 F (36.6 C)  Recent Labs  Lab 01/03/23 0256 01/04/23 0345 01/05/23 0258 01/06/23 0536 01/07/23 0432 01/07/23 1744  WBC 18.5* 19.6* 11.5* 11.4* 12.3*  --   CREATININE 0.67 0.61 0.69 0.65 0.75 0.68    Estimated Creatinine Clearance: 91.6 mL/min (by C-G formula based on SCr of 0.68 mg/dL).    Allergies  Allergen Reactions   Silicone Hives and Itching   Dimethyl Fumarate Itching and Hives    Abran Duke, PharmD, BCPS Clinical Pharmacist Phone: [phone]

## 2023-01-08 NOTE — Procedures (Addendum)
01/08/2023  Called to room for Afib/RVR, sats 90% on 40LPM 100% and NRB Obvious distress, Bps marginal Still a full code, unclear talks to date and she's in no state to have this conversation. Proceeded with intubation, diminished breath sounds on L after but no clear PTX on CXR so attempted to place left subclavian sterile, this was unsuccessful. Then attempted RIJ sterile, threaded to 10cm and good unpulsatile blood but CXR showed malpositioned so had to take this out.  CXR also showed L PTX so L pigtail placed in semi-sterile fashion as patient was desaturating.. Bilateral US of neck showed markedly abnormal veinous architecture. Attention then turned to R groin, prepped and CVL placed. Attempted R femoral arterial line, flash obtained but unable to thread, again noted was markedly small arteries and veins. Finally, under sterile conditions R radial line placed under US guidance. Remains tenuous. Needs GOC talk.  I called sister and left VM.  Will place palliative care consult and update daytime team.  This note serves for Endotracheal intubation Attempted L subclavian MML Attempted RIJ MML L pigtail placement w/o US guidance R femoral MML Attempted R femoral arterial line R radial arterial line  All emergent consent Indication: peri-arrest  Review of chart after above emergent events indicates chronic SVC occlusion likely reason for all the issues above.  [name] MD PCCM

## 2023-01-09 ENCOUNTER — Inpatient Hospital Stay (HOSPITAL_COMMUNITY): Payer: Medicare HMO

## 2023-01-09 DIAGNOSIS — J189 Pneumonia, unspecified organism: Secondary | ICD-10-CM | POA: Diagnosis not present

## 2023-01-09 DIAGNOSIS — J181 Lobar pneumonia, unspecified organism: Secondary | ICD-10-CM | POA: Diagnosis not present

## 2023-01-09 DIAGNOSIS — E440 Moderate protein-calorie malnutrition: Secondary | ICD-10-CM | POA: Insufficient documentation

## 2023-01-09 DIAGNOSIS — Z515 Encounter for palliative care: Secondary | ICD-10-CM | POA: Diagnosis not present

## 2023-01-09 DIAGNOSIS — A419 Sepsis, unspecified organism: Secondary | ICD-10-CM | POA: Diagnosis not present

## 2023-01-09 DIAGNOSIS — Z7189 Other specified counseling: Secondary | ICD-10-CM

## 2023-01-09 LAB — POCT I-STAT 7, (LYTES, BLD GAS, ICA,H+H)
Acid-base deficit: 4 mmol/L — ABNORMAL HIGH (ref 0.0–2.0)
Acid-base deficit: 1 mmol/L (ref 0.0–2.0)
Acid-base deficit: 4 mmol/L — ABNORMAL HIGH (ref 0.0–2.0)
Bicarbonate: 24.6 mmol/L (ref 20.0–28.0)
Bicarbonate: 26.9 mmol/L (ref 20.0–28.0)
Bicarbonate: 26 mmol/L (ref 20.0–28.0)
Calcium, Ion: 1.44 mmol/L — ABNORMAL HIGH (ref 1.15–1.40)
Calcium, Ion: 1.35 mmol/L (ref 1.15–1.40)
Calcium, Ion: 1.41 mmol/L — ABNORMAL HIGH (ref 1.15–1.40)
HCT: 22 % — ABNORMAL LOW (ref 36.0–46.0)
HCT: 20 % — ABNORMAL LOW (ref 36.0–46.0)
HCT: 26 % — ABNORMAL LOW (ref 36.0–46.0)
Hemoglobin: 7.5 g/dL — ABNORMAL LOW (ref 12.0–15.0)
Hemoglobin: 6.8 g/dL — CL (ref 12.0–15.0)
Hemoglobin: 8.8 g/dL — ABNORMAL LOW (ref 12.0–15.0)
O2 Saturation: 92 %
O2 Saturation: 96 %
O2 Saturation: 88 %
Patient temperature: 98.5
Patient temperature: 98.5
Patient temperature: 98
Potassium: 5 mmol/L (ref 3.5–5.1)
Potassium: 4.5 mmol/L (ref 3.5–5.1)
Potassium: 4.6 mmol/L (ref 3.5–5.1)
Sodium: 133 mmol/L — ABNORMAL LOW (ref 135–145)
Sodium: 134 mmol/L — ABNORMAL LOW (ref 135–145)
Sodium: 136 mmol/L (ref 135–145)
TCO2: 27 mmol/L (ref 22–32)
TCO2: 29 mmol/L (ref 22–32)
TCO2: 29 mmol/L (ref 22–32)
pCO2 arterial: 69.7 mmHg (ref 32–48)
pCO2 arterial: 65.9 mmHg (ref 32–48)
pCO2 arterial: 82 mmHg (ref 32–48)
pH, Arterial: 7.156 — CL (ref 7.35–7.45)
pH, Arterial: 7.218 — ABNORMAL LOW (ref 7.35–7.45)
pH, Arterial: 7.108 — CL (ref 7.35–7.45)
pO2, Arterial: 81 mmHg — ABNORMAL LOW (ref 83–108)
pO2, Arterial: 101 mmHg (ref 83–108)
pO2, Arterial: 75 mmHg — ABNORMAL LOW (ref 83–108)

## 2023-01-09 LAB — LACTIC ACID, PLASMA: Lactic Acid, Venous: 1.5 mmol/L (ref 0.5–1.9)

## 2023-01-09 LAB — HEPARIN LEVEL (UNFRACTIONATED): Heparin Unfractionated: >1.1 [IU]/mL — ABNORMAL HIGH (ref 0.30–0.70)

## 2023-01-09 LAB — HEMOGLOBIN AND HEMATOCRIT, BLOOD
HCT: 28.4 % — ABNORMAL LOW (ref 36.0–46.0)
Hemoglobin: 8.4 g/dL — ABNORMAL LOW (ref 12.0–15.0)

## 2023-01-09 LAB — BASIC METABOLIC PANEL
Anion gap: 14 (ref 5–15)
BUN: 51 mg/dL — ABNORMAL HIGH (ref 6–20)
CO2: 23 mmol/L (ref 22–32)
Calcium: 9.6 mg/dL (ref 8.9–10.3)
Chloride: 98 mmol/L (ref 98–111)
Creatinine, Ser: 1.18 mg/dL — ABNORMAL HIGH (ref 0.44–1.00)
GFR, Estimated: 58 mL/min — ABNORMAL LOW (ref >60)
Glucose, Bld: 354 mg/dL — ABNORMAL HIGH (ref 70–99)
Potassium: 5.3 mmol/L — ABNORMAL HIGH (ref 3.5–5.1)
Sodium: 135 mmol/L (ref 135–145)

## 2023-01-09 LAB — PREPARE RBC (CROSSMATCH): Order Confirmation: POSITIVE

## 2023-01-09 LAB — CBC
HCT: 23.2 % — ABNORMAL LOW (ref 36.0–46.0)
Hemoglobin: 7.1 g/dL — ABNORMAL LOW (ref 12.0–15.0)
MCH: 26.8 pg (ref 26.0–34.0)
MCHC: 30.6 g/dL (ref 30.0–36.0)
MCV: 87.5 fL (ref 80.0–100.0)
Platelets: 66 10*3/uL — ABNORMAL LOW (ref 150–400)
RBC: 2.65 MIL/uL — ABNORMAL LOW (ref 3.87–5.11)
RDW: 16.7 % — ABNORMAL HIGH (ref 11.5–15.5)
WBC: 15.6 10*3/uL — ABNORMAL HIGH (ref 4.0–10.5)
nRBC: 0 % (ref 0.0–0.2)

## 2023-01-09 LAB — APTT: aPTT: 170 s (ref 24–36)

## 2023-01-09 LAB — CULTURE, BLOOD (ROUTINE X 2)
Culture: NO GROWTH
Culture: NO GROWTH
Special Requests: ADEQUATE
Special Requests: ADEQUATE

## 2023-01-09 LAB — GLUCOSE, CAPILLARY
Glucose-Capillary: 325 mg/dL — ABNORMAL HIGH (ref 70–99)
Glucose-Capillary: 287 mg/dL — ABNORMAL HIGH (ref 70–99)
Glucose-Capillary: 256 mg/dL — ABNORMAL HIGH (ref 70–99)
Glucose-Capillary: 266 mg/dL — ABNORMAL HIGH (ref 70–99)
Glucose-Capillary: 238 mg/dL — ABNORMAL HIGH (ref 70–99)
Glucose-Capillary: 203 mg/dL — ABNORMAL HIGH (ref 70–99)

## 2023-01-09 LAB — TRIGLYCERIDES: Triglycerides: 291 mg/dL — ABNORMAL HIGH (ref <150)

## 2023-01-09 MED ORDER — SODIUM BICARBONATE 8.4 % IV SOLN
100.0000 meq | Freq: Once | INTRAVENOUS | Status: AC
  Administered 2023-01-09: 100 meq via INTRAVENOUS

## 2023-01-09 MED ORDER — SODIUM ZIRCONIUM CYCLOSILICATE 5 G PO PACK
5.0000 g | PACK | Freq: Once | ORAL | Status: AC
  Administered 2023-01-09: 5 g via ORAL
  Filled 2023-01-09 (×2): qty 1

## 2023-01-09 MED ORDER — TRAVASOL 10 % IV SOLN: INTRAVENOUS | Status: DC

## 2023-01-09 MED ORDER — IOHEXOL 300 MG/ML  SOLN
50.0000 mL | Freq: Once | INTRAMUSCULAR | Status: AC | PRN
  Administered 2023-01-09: 30 mL

## 2023-01-09 MED ORDER — HEPARIN (PORCINE) 25000 UT/250ML-% IV SOLN
750.0000 [IU]/h | INTRAVENOUS | Status: DC
  Administered 2023-01-09: 750 [IU]/h via INTRAVENOUS

## 2023-01-09 MED ORDER — INSULIN GLARGINE-YFGN 100 UNIT/ML ~~LOC~~ SOLN
10.0000 [IU] | Freq: Once | SUBCUTANEOUS | Status: AC
  Administered 2023-01-09: 10 [IU] via SUBCUTANEOUS
  Filled 2023-01-09: qty 0.1

## 2023-01-09 MED ORDER — TRAVASOL 10 % IV SOLN
INTRAVENOUS | Status: AC
  Filled 2023-01-09: qty 1154.4

## 2023-01-09 MED ORDER — SODIUM CHLORIDE 0.9% IV SOLUTION
Freq: Once | INTRAVENOUS | Status: AC
  Administered 2023-01-09: 10 mL/h via INTRAVENOUS

## 2023-01-09 MED ORDER — PIVOT 1.5 CAL PO LIQD
1000.0000 mL | ORAL | Status: DC
  Administered 2023-01-10: 1000 mL
  Filled 2023-01-09 (×2): qty 1000

## 2023-01-09 MED ORDER — DIGOXIN 0.25 MG/ML IJ SOLN
0.1250 mg | Freq: Every day | INTRAMUSCULAR | Status: DC
  Administered 2023-01-10: 0.125 mg via INTRAVENOUS
  Filled 2023-01-09: qty 0.5

## 2023-01-09 MED ORDER — VANCOMYCIN HCL IN DEXTROSE 1-5 GM/200ML-% IV SOLN
1000.0000 mg | INTRAVENOUS | Status: DC
  Administered 2023-01-10: 1000 mg via INTRAVENOUS
  Filled 2023-01-09: qty 200

## 2023-01-09 MED ORDER — VASOPRESSIN 20 UNITS/100 ML INFUSION FOR SHOCK
0.0000 [IU]/min | INTRAVENOUS | Status: DC
  Administered 2023-01-09 (×2): 0.03 [IU]/min via INTRAVENOUS
  Filled 2023-01-09: qty 100

## 2023-01-09 MED ORDER — SODIUM BICARBONATE 8.4 % IV SOLN
INTRAVENOUS | Status: AC
  Filled 2023-01-09: qty 100
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
## 2023-01-09 NOTE — Inpatient Diabetes Management (Addendum)
Inpatient Diabetes Program Recommendations  AACE/ADA: New Consensus Statement on Inpatient Glycemic Control (2015)  Target Ranges:  Prepandial:   less than 140 mg/dL      Peak postprandial:   less than 180 mg/dL (1-2 hours)      Critically ill patients:  140 - 180 mg/dL    Latest Reference Range & Units 01/04/23 11:56  Hemoglobin A1C 4.8 - 5.6 % 6.4 (H)  (H): Data is abnormally high  Latest Reference Range & Units 01/07/23 23:02 01/08/23 04:05 01/08/23 07:20 01/08/23 11:39 01/08/23 16:47 01/08/23 19:03  Glucose-Capillary 70 - 99 mg/dL 161 (H)  7 units Novolog  238 (H)  7 units Novolog  138 (H)  3 units Novolog  122 (H)  3 units Novolog  153 (H)  4 units Novolog  210 (H)  7 units Novolog   (H): Data is abnormally high  Latest Reference Range & Units 01/08/23 23:04 01/09/23 03:04  Glucose-Capillary 70 - 99 mg/dL 096 (H)  15 units Novolog  325 (H)  15 units Novolog   (H): Data is abnormally high   Admit with:  Acute hypoxic respiratory failure due to pneumonia, neuromuscular weakness Sepsis 2/2 bilateral multifocal pneumonia, post-COVID syndrome, POA Admitted to [facility] from 8/10 to 12/14/2022 due to multifocal pneumonia secondary to COVID-19 virus infection she had a flare of multiple sclerosis during the hospitalization and she was treated with IV Solu-Medrol 1000 mg for 3 days.  Steroid taper was also prescribed on discharge.  On returning home, she started to have increasing shortness of breath within 36 hours of being discharged, so she went back to [facility] and was readmitted.    History: MS  Current Insulin Orders: Novolog Resistant Correction Scale/ SSI (0-20 units) Q4 hours    MD- Note pt getting Solumedrol 80 mg Daily  Pivot tube feeds started yest at 7pm 20cc/hr  TPN running 65cc/hr--No Insulin in the TPN  CBGs >300 since Midnight  Please consider:  1. Start Semglee 10 units BID (~0.2 units/kg split dose)  2. May consider asking pharmacy to add Insulin  to the TPN  If insulin adjustments made and CBGs stay >200, could also stop SQ Insulin and Start IV Insulin Drip per Phase 2 of the ICU Glycemic Control Protocol order set     --Will follow patient during hospitalization--  Ambrose Finland RN, MSN, CDCES Diabetes Coordinator Inpatient Glycemic Control Team Team Pager: [phone] (8a-5p)

## 2023-01-09 NOTE — Progress Notes (Signed)
Patient's mother and sister have submitted a list of visitors that can be approved to visit. The front desk has a copy of this list.

## 2023-01-09 NOTE — Progress Notes (Addendum)
ANTICOAGULATION CONSULT NOTE  Pharmacy Consult for heparin Indication: atrial fibrillation Brief A/P: aPTT supratherapeutic Decrease Heparin rate  Allergies  Allergen Reactions   Silicone Hives and Itching   Dimethyl Fumarate Itching and Hives    Patient Measurements: Height: 5\' 5"  (165.1 cm) Weight: 79.4 kg (175 lb 0.7 oz) IBW/kg (Calculated) : 57 Heparin Dosing Weight: 79 kg  Vital Signs: Temp: 98.7 F (37.1 C) (09/10 0316) Temp Source: Oral (09/10 0316) Pulse Rate: 123 (09/10 0430)  Labs: Recent Labs    01/07/23 0432 01/07/23 1744 01/08/23 0235 01/08/23 0509 01/08/23 0529 01/08/23 0922 01/08/23 1149 01/08/23 1351 01/08/23 1835 01/09/23 0153 01/09/23 0424  HGB 9.7*  --    < > 8.1*   < > 8.8* 9.9*  --   --  7.1*  --   HCT 32.0*  --    < > 26.4*   < > 26.0* 29.0*  --   --  23.2*  --   PLT 154  --   --  115*  --   --   --   --   --  66*  --   APTT  --   --   --   --   --   --   --   --  >200* 170*  --   HEPARINUNFRC  --   --   --   --   --   --   --   --  >1.10*  --  >1.10*  CREATININE 0.75 0.68  --  0.59  --   --   --   --   --  1.18*  --   TROPONINIHS  --   --   --  87*  --   --   --  125*  --   --   --    < > = values in this interval not displayed.    Estimated Creatinine Clearance: 62.1 mL/min (A) (by C-G formula based on SCr of 1.18 mg/dL (H)).  Assessment: 47 y.o. female with h/o DVT, Eliquis on hold, for heparin  Goal of Therapy:  Heparin level 0.3-0.7 units/ml aPTT 66-102 seconds Monitor platelets by anticoagulation protocol: Yes   Plan:  Hold heparin x 1 hour, then decrease Heparin 750 units/hr Check aPTT in 8 hours    Geannie Risen, PharmD, BCPS

## 2023-01-09 NOTE — Progress Notes (Signed)
Critical ABG results given to Dr. Merrily Pew and RN. Verbal order received to increase RR to 35 and FiO2 to 100%. RT will continue to monitor and be available as needed.

## 2023-01-09 NOTE — Progress Notes (Signed)
Pharmacy Antibiotic Note  Amber Williams is a 47 y.o. female admitted on 11/30/2022 with pneumonia and sepsis.  Pharmacy has been consulted for Vancomycin/Zosyn dosing. WBC 20.3 >> 15.6, afebrile. Pt presenting with AKI, Scr up to 1.18. Will adjust vancomycin dose.    Plan: Vancomyin 1000 mg q24h (eAUC 476, based on Scr 1.18)  Continue Zosyn 3.375G IV q8h to be infused over 4 hours Trend WBC, temp, renal function  F/U infectious work-up Drug levels as indicated   Height: 5\' 5"  (165.1 cm) Weight: 85.8 kg (189 lb 2.5 oz) IBW/kg (Calculated) : 57  Temp (24hrs), Avg:98.7 F (37.1 C), Min:97.9 F (36.6 C), Max:99.6 F (37.6 C)  Recent Labs  Lab 01/05/23 0258 01/06/23 0536 01/07/23 0432 01/07/23 1744 01/08/23 0509 01/08/23 1351 01/09/23 0153  WBC 11.5* 11.4* 12.3*  --  20.3*  --  15.6*  CREATININE 0.69 0.65 0.75 0.68 0.59  --  1.18*  LATICACIDVEN  --   --   --   --  2.9* 1.5 1.5    Estimated Creatinine Clearance: 64.4 mL/min (A) (by C-G formula based on SCr of 1.18 mg/dL (H)).    Allergies  Allergen Reactions   Silicone Hives and Itching   Dimethyl Fumarate Itching and Hives   Antimicrobials this admission: Vanco 8/26 >> 8/29 Cefepime 8/26 >> 8/29 Augmentin 8/29 >> 9/1 Cefepime 9/4 >> 9/9 Vanco 9/9 >>  Zosyn 9/9 >>   Microbiology results:  9/10 sputum cx:  9/5 Bcx; NGTD 8/26 Bcx: NG MRSA PCR: neg COVID-19 positive on 12/03/2022 and 12/16/2022    Thank you for involving pharmacy in the patient's care.   Theotis Burrow, PharmD PGY1 Acute Care Pharmacy Resident  01/09/2023 10:17 AM

## 2023-01-09 NOTE — Plan of Care (Signed)
  Problem: Activity: Goal: Ability to tolerate increased activity will improve Outcome: Progressing   Problem: Clinical Measurements: Goal: Ability to maintain a body temperature in the normal range will improve Outcome: Progressing   Problem: Respiratory: Goal: Ability to maintain adequate ventilation will improve Outcome: Progressing   Problem: Respiratory: Goal: Ability to maintain a clear airway will improve Outcome: Progressing   Problem: Nutrition: Goal: Adequate nutrition will be maintained Outcome: Progressing

## 2023-01-09 NOTE — Progress Notes (Signed)
PHARMACY - TOTAL PARENTERAL NUTRITION CONSULT NOTE   Indication:  intolerance to enteral nutrition   Patient Measurements: Height: 5\' 5"  (165.1 cm) Weight: 85.8 kg (189 lb 2.5 oz) IBW/kg (Calculated) : 57 TPN AdjBW (KG): 62.6 Body mass index is 31.48 kg/m.  Assessment: 47 years of age female with multiple sclerosis and history of gastric bypass in 2010, admitted this admission for persistent shortness of breath s/p recent COVID pneumonia. Patient was previously on regular diet with variable meal completions (15-70%) but has been poor appetite. Made NPO with high intubation risk this admission. Post-pyloric tube placement attempted 9/6 was unsuccessful. Pharmacy consulted for TPN.  High re-feeding risk.   Overnight: TPN disconnected and started on D10 at 80 ml/hr which was subsequently stopped by Dr. Merrily Pew. Plan to resume TPN this PM. Afib and code overnight requiring 2 shocks, bicarb, amio, and digoxin.   Glucose / Insulin: CBG 200-300's, moderate SSI (47 units required). Receiving high dose methylprednisolone taper. Giving Semglee 10 units now.  Electrolytes: K up 5, Phos 2.4- received KPhos 15 mmol, Mg 2. CO2 improved 23, CL down 98, *Hypercalcemic -iCa elevated 1.5>>1.44 (no Calcium in TPN) Renal: Acute AKI, SCr up 1.18, BUN up 51 Hepatic: LFTs wnl. TG elevated 290>>291 on propofol (reduced lipids in TPN>> will further reduce).  Intake / Output; MIVF: UOP decreasing. LBM 8/27. GI Imaging: none since start of TPN GI Surgeries / Procedures: none since start of TPN  Central access: port (will re-attempt PICC) TPN start date: 01/06/23  Nutritional Goals: Goal TPN rate is 80 mL/hr (provides 113 g of protein and 1877 kcals per day)  RD Assessment: Estimated Needs Total Energy Estimated Needs: 1800-2000 Total Protein Estimated Needs: 100-115 grams Total Fluid Estimated Needs: 1.8-2L/d  Current Nutrition:  TPN and Tube feeding  - Pivot at 20 ml/hr but question of tube in correct  position and GIB so holding.  Propofol at 50 >>30 mcg/kg/min (~24>>16.3 ml/hr; 576 >>392 kcals/day).   Plan:  Removed lipids for now due to high rate of Propofol and high TG.  Continue TPN at 65 ml/hr (adjusted with removal of lipids)- provides 115g protein and 1257 kcals - with propofol will provide 1833 kcals  per day - meeting 100% goal needs.  Electrolytes in TPN: removing K/Ca/Mg/Phos for now due to AKI and will replace outside of TPN >> increase Na 63mEq/L, remove K, remove Ca, remove Mg, and remove Phos. Change Cl:Ac 1:1 Add standard MVI and trace elements to TPN Continue Resistant q4h SSI and adjust as needed  Add regular insulin 25 units to TPN bag at 1800.  Monitor CBGs as steroid dose decreasing to see if need to add to TPN.  Thiamine 100mg  IV daily x5 days per RD recommendations.  Monitor TPN labs on Mon/Thurs, repeat labs in AM  Amber Williams, PharmD, BCPS, BCCCP Please refer to [facility] for [facility] numbers 01/09/2023,9:46 AM

## 2023-01-09 NOTE — Progress Notes (Signed)
Brief Nutrition Note  Pt started on trickle feeds overnight and appears to be tolerating. However, pt did have blood noted in the anal/vaginal region this AM. Concern for GI bleed. RN reports she feels it was more related to the extreme irritation and excoriation to the perineum and anal region. Hgb has drifted down today.   Discussed feeds in rounds. Continues on TPN at goal. CGBs high today. MD requests to begin increase TF rate.   Discussed concern with placement of tube tip as XR 9/9 looks similar to XR 9/6 which was read as gastric pouch. Per MD - ok to order XR with contrast to confirm placement of tube tip and determine if pt needs to be taken for fluoroscopy intervention. Will increase rate to 30 as this would still be tolerated even if tip of tube is in the gastric pouch.   INTERVENTION:  Goal enteral feeds: Pivot 1.5 at 50 ml/h (1200 ml per day) Provides 1800 kcal, 113 gm protein, 900 ml free water  1 packet Juven BID, each packet provides 95 calories, 2.5 grams of protein (collagen), and 9.8 grams of carbohydrate (3 grams sugar); also contains 7 grams of L-arginine and L-glutamine, 300 mg vitamin C, 15 mg vitamin E, 1.2 mcg vitamin B-12, 9.5 mg zinc, 200 mg calcium, and 1.5 g  Calcium Beta-hydroxy-Beta-methylbutyrate to support wound healing TPN management per pharmacy, if TF tolerated and able to continue advancing, would recommend weaning 9/11   Greig Castilla, RD, LDN Clinical Dietitian RD pager # available in AMION  After hours/weekend pager # available in [facility]

## 2023-01-09 NOTE — Progress Notes (Signed)
Reached out to [facility] regarding results of abdominal xray per Elink RN, Elink MD Dr. Warrick Parisian advised to hold tube feeds and tube medications for the night.

## 2023-01-09 NOTE — Progress Notes (Signed)
Critical ABG results given to Dr. Merrily Pew. No new orders received at this time. RT will continue to monitor and be available as needed.

## 2023-01-09 NOTE — Consult Note (Signed)
Consultation Note Date: 01/09/2023   Patient Name: Amber Williams  DOB: 12/04/1975  MRN: [id number]  Age / Sex: 47 y.o., female  PCP: Mechele Claude, MD Referring Physician: Cheri Fowler, MD  Reason for Consultation: Establishing goals of care  HPI/Patient Profile: 47 y.o. female  with past medical history of multiple sclerosis, neurogenic bladder, anxiety, DVT on Eliquis, hypothyroidism, hypertension, hyperlipidemia, GERD admitted on 01/23/2023 with persistent shortness of breath.   Patient has had recurrent hospitalizations, most recently 8/10 to 8/15 for multifocal pneumonia and COVID with MS flare.  Today is day 14 of hospital admission for sepsis secondary to multifocal pneumonia, acute respiratory failure with hypoxia.  She was initially placed on BiPAP with ongoing dyspnea, ultimately intubated on 9/9.    She remains critically ill with sepsis and septic shock, post-COVID syndrome, persistent A-fib with RVR, AKI, acute on chronic biventricular HFrEF, anemia/thrombocytopenia, suspected acute flare of MS, acute metabolic encephalopathy. PMT has been consulted to assist with goals of care conversation.  Clinical Assessment and Goals of Care:  I have reviewed medical records including EPIC notes, labs and imaging, assessed the patient and then then in the waiting room with patient's sister, mother, mother's husband, aunt, and another relative to discuss diagnosis prognosis, GOC, EOL wishes, disposition and options.  I introduced Palliative Medicine as specialized medical care for people living with serious illness. It focuses on providing relief from the symptoms and stress of a serious illness. The goal is to improve quality of life for both the patient and the family.  We discussed a brief life review of the patient and then focused on their current illness.   I attempted to elicit values and goals of care  important to the patient.    Medical History Review and Understanding:  We discussed patient's acute illness in the context of her chronic comorbidities and ongoing difficulties particularly since at least April of this year.  Social History: Patient has been living in her apartment with her boyfriend of 3 years.  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  She has 2 dogs she is very close to her family.  She is unmarried and has no children.  Functional and Nutritional State: Patient typically walks with a cane/walker at baseline.  She has had ongoing difficulty with her mobility.  Albumin of 1.8 noted.  We discussed current efforts to advance tube feeds and decrease TPN.  Advance Directives: A detailed discussion regarding advanced directives was had.  Patient's family is unaware of any advanced directive she has ever completed.  They do not feel she has done this.  We discussed the legal hierarchy of medical decision making in [place].  Code Status: Concepts specific to code status, artifical feeding and hydration, and rehospitalization were considered and discussed.  Patient's family confirmed the desire for full code, at least for now.  Discussion: Patient's family are still very shocked by how severely ill she is.  They are taking things day by day and very hopeful that she can get through this.  They have been in consistent communication with patient's care team and her sister Amber Williams) is assisting family with understanding the updates being received.  Patient's mother and sister make decisions together and are relieved that her boyfriend cannot override these decisions.  They share that Amber Williams has never previously shared her wishes or care preferences with them in the past.  They are inclined to try every aggressive intervention they can to hopefully  get her through this.   Provided with a brief medical update and family is appreciative.  They are familiar with  palliative care, as patient's mother is enrolled and her mother was previously enrolled as well.  Anticipated meeting with patient's medical team later today and they are appreciative of the ongoing support.  Returned to the bedside in the afternoon to provide additional palliative support.  Reviewed the conversation held with the critical care team.  Patient's mother is doing as well as she could expect, becoming tearful and stating that this will be a very long 24 to 48 hours.  She acknowledges that this will likely be the hardest thing she ever has to do and she wishes she could take away Amber Williams's suffering.  We discussed God's timing and supporting Amber Williams through this difficult time.  Chaplain was offered, politely declined for now.  She has been visited by her own pastors.  Patient's boyfriend Amber Williams is also present visiting.  Emotional support was provided.   Discussed the importance of continued conversation with family and the medical providers regarding overall plan of care and treatment options, ensuring decisions are within the context of the patient's values and GOCs.   Questions and concerns were addressed.  Hard Choices booklet left for review. The family was encouraged to call with questions or concerns.  PMT will continue to support holistically.   SUMMARY OF RECOMMENDATIONS   -Continue full code/full scope treatment -Patient's family understand the severity of her devastating illness.  Allow another 24 to 48 hours for outcomes -Ongoing goals of care discussions -Psychosocial and emotional support provided -PMT will continue to follow and support   Prognosis: Poor overall prognosis  Discharge Planning: To Be Determined      Primary Diagnoses: Present on Admission:  Pneumonia due to infectious organism  DVT (deep venous thrombosis) (HCC)  Acquired hypothyroidism  GERD without esophagitis  Pressure injury of sacral region, stage 2 [facility])   Physical Exam Vitals and  nursing note reviewed.  Constitutional:      General: She is not in acute distress.    Appearance: She is ill-appearing.     Interventions: She is intubated.  Cardiovascular:     Rate and Rhythm: Tachycardia present.  Pulmonary:     Effort: Pulmonary effort is normal. No respiratory distress. She is intubated.  Neurological:     Mental Status: She is unresponsive.    Vital Signs: BP (!) 121/58   Pulse (!) 135   Temp 98.5 F (36.9 C) (Axillary)   Resp 14   Ht 5\' 5"  (1.651 m)   Wt 85.8 kg   SpO2 91%   BMI 31.48 kg/m  Pain Scale: CPOT   Pain Score: 0-No pain   SpO2: SpO2: 91 % O2 Device:SpO2: 91 % O2 Flow Rate: .O2 Flow Rate (L/min): 40 L/min   Palliative Assessment/Data:     MDM: high   Amber Appenzeller Jeni Salles, PA-C  Palliative Medicine Team Team phone # [phone]  Thank you for allowing the Palliative Medicine Team to assist in the care of this patient. Please utilize secure chat with additional questions, if there is no response within 30 minutes please call the above phone number.  Palliative Medicine Team providers are available by phone from 7am to 7pm daily and can be reached through the team cell phone.  Should this patient require assistance outside of these hours, please call the patient's attending physician.

## 2023-01-09 NOTE — Progress Notes (Signed)
Critical ABG results given to Dr. Merrily Pew. Verbal order received to decrease RR to 30 and itime to 0.7 RT will continue to monitor and be available as needed.

## 2023-01-09 NOTE — IPAL (Signed)
  Interdisciplinary Goals of Care Family Meeting   Date carried out: 01/09/2023  Location of the meeting: Conference room  Member's involved: Physician, Bedside Registered Nurse, Social Worker, and Family Member or next of kin  Durable Power of Attorney or acting medical decision maker: Glendola Stanley Peyne/Debbie Dimwiddie    Discussion: We discussed goals of care for [employer] .  Patient's condition was updated to patient's family, explained that she is not improving and progressively getting worse day by day, now she developed acute kidney injury, has respiratory failure and sepsis with septic shock.  She has underlying MS and was treated for acute exacerbation of MS.  Despite full scope of care patient has not shown any signs of improvement and unfortunately slowly declining.  Explained to family that we will try next 48 hours with full scope of care to see if she improves, if she continues to decline unfortunately then we do not have much to add, patient's family agreed and would like to meet again next 48 hours for further updates and goals of care  Code status:   Code Status: Full Code   Disposition: Continue current acute care      Cheri Fowler, MD [facility] See Amion for pager If no response to pager, please call [phone] until 7pm After 7pm, Please call E-link [phone]

## 2023-01-10 ENCOUNTER — Ambulatory Visit: Payer: Medicare HMO | Admitting: Urology

## 2023-01-10 ENCOUNTER — Inpatient Hospital Stay (HOSPITAL_COMMUNITY): Payer: Medicare HMO

## 2023-01-10 DIAGNOSIS — Z7189 Other specified counseling: Secondary | ICD-10-CM | POA: Diagnosis not present

## 2023-01-10 DIAGNOSIS — E440 Moderate protein-calorie malnutrition: Secondary | ICD-10-CM

## 2023-01-10 DIAGNOSIS — J9601 Acute respiratory failure with hypoxia: Secondary | ICD-10-CM | POA: Diagnosis not present

## 2023-01-10 DIAGNOSIS — J189 Pneumonia, unspecified organism: Secondary | ICD-10-CM | POA: Diagnosis not present

## 2023-01-10 DIAGNOSIS — A419 Sepsis, unspecified organism: Secondary | ICD-10-CM | POA: Diagnosis not present

## 2023-01-10 DIAGNOSIS — E8809 Other disorders of plasma-protein metabolism, not elsewhere classified: Secondary | ICD-10-CM | POA: Diagnosis not present

## 2023-01-10 LAB — BPAM RBC
Blood Product Expiration Date: 202410072359
ISSUE DATE / TIME: 202409101219
Unit Type and Rh: 5100

## 2023-01-10 LAB — POCT I-STAT 7, (LYTES, BLD GAS, ICA,H+H)
Acid-base deficit: 3 mmol/L — ABNORMAL HIGH (ref 0.0–2.0)
Acid-base deficit: 3 mmol/L — ABNORMAL HIGH (ref 0.0–2.0)
Acid-base deficit: 3 mmol/L — ABNORMAL HIGH (ref 0.0–2.0)
Acid-base deficit: 3 mmol/L — ABNORMAL HIGH (ref 0.0–2.0)
Bicarbonate: 27.3 mmol/L (ref 20.0–28.0)
Bicarbonate: 27.3 mmol/L (ref 20.0–28.0)
Bicarbonate: 27.1 mmol/L (ref 20.0–28.0)
Bicarbonate: 27.5 mmol/L (ref 20.0–28.0)
Calcium, Ion: 1.52 mmol/L (ref 1.15–1.40)
Calcium, Ion: 1.53 mmol/L (ref 1.15–1.40)
Calcium, Ion: 1.54 mmol/L (ref 1.15–1.40)
Calcium, Ion: 1.52 mmol/L (ref 1.15–1.40)
HCT: 25 % — ABNORMAL LOW (ref 36.0–46.0)
HCT: 26 % — ABNORMAL LOW (ref 36.0–46.0)
HCT: 25 % — ABNORMAL LOW (ref 36.0–46.0)
HCT: 26 % — ABNORMAL LOW (ref 36.0–46.0)
Hemoglobin: 8.5 g/dL — ABNORMAL LOW (ref 12.0–15.0)
Hemoglobin: 8.8 g/dL — ABNORMAL LOW (ref 12.0–15.0)
Hemoglobin: 8.5 g/dL — ABNORMAL LOW (ref 12.0–15.0)
Hemoglobin: 8.8 g/dL — ABNORMAL LOW (ref 12.0–15.0)
O2 Saturation: 97 %
O2 Saturation: 98 %
O2 Saturation: 97 %
O2 Saturation: 85 %
Patient temperature: 98.1
Patient temperature: 98.9
Patient temperature: 98.1
Patient temperature: 98
Potassium: 4.7 mmol/L (ref 3.5–5.1)
Potassium: 4.7 mmol/L (ref 3.5–5.1)
Potassium: 4.7 mmol/L (ref 3.5–5.1)
Potassium: 4.9 mmol/L (ref 3.5–5.1)
Sodium: 137 mmol/L (ref 135–145)
Sodium: 137 mmol/L (ref 135–145)
Sodium: 138 mmol/L (ref 135–145)
Sodium: 136 mmol/L (ref 135–145)
TCO2: 30 mmol/L (ref 22–32)
TCO2: 30 mmol/L (ref 22–32)
TCO2: 30 mmol/L (ref 22–32)
TCO2: 30 mmol/L (ref 22–32)
pCO2 arterial: 86.2 mmHg (ref 32–48)
pCO2 arterial: 89.7 mmHg (ref 32–48)
pCO2 arterial: 89.4 mmHg (ref 32–48)
pCO2 arterial: 90.4 mmHg (ref 32–48)
pH, Arterial: 7.107 — CL (ref 7.35–7.45)
pH, Arterial: 7.092 — CL (ref 7.35–7.45)
pH, Arterial: 7.088 — CL (ref 7.35–7.45)
pH, Arterial: 7.088 — CL (ref 7.35–7.45)
pO2, Arterial: 133 mmHg — ABNORMAL HIGH (ref 83–108)
pO2, Arterial: 158 mmHg — ABNORMAL HIGH (ref 83–108)
pO2, Arterial: 131 mmHg — ABNORMAL HIGH (ref 83–108)
pO2, Arterial: 69 mmHg — ABNORMAL LOW (ref 83–108)

## 2023-01-10 LAB — CBC
HCT: 27.2 % — ABNORMAL LOW (ref 36.0–46.0)
Hemoglobin: 8.2 g/dL — ABNORMAL LOW (ref 12.0–15.0)
MCH: 27.2 pg (ref 26.0–34.0)
MCHC: 30.1 g/dL (ref 30.0–36.0)
MCV: 90.1 fL (ref 80.0–100.0)
Platelets: 56 10*3/uL — ABNORMAL LOW (ref 150–400)
RBC: 3.02 MIL/uL — ABNORMAL LOW (ref 3.87–5.11)
RDW: 16.9 % — ABNORMAL HIGH (ref 11.5–15.5)
WBC: 21 10*3/uL — ABNORMAL HIGH (ref 4.0–10.5)
nRBC: 0.1 % (ref 0.0–0.2)

## 2023-01-10 LAB — PHOSPHORUS: Phosphorus: 3.5 mg/dL (ref 2.5–4.6)

## 2023-01-10 LAB — TYPE AND SCREEN
ABO/RH(D): O POS
Antibody Screen: NEGATIVE
Unit division: 0

## 2023-01-10 LAB — BASIC METABOLIC PANEL
Anion gap: 4 — ABNORMAL LOW (ref 5–15)
BUN: 82 mg/dL — ABNORMAL HIGH (ref 6–20)
CO2: 27 mmol/L (ref 22–32)
Calcium: 9.5 mg/dL (ref 8.9–10.3)
Chloride: 105 mmol/L (ref 98–111)
Creatinine, Ser: 1.39 mg/dL — ABNORMAL HIGH (ref 0.44–1.00)
GFR, Estimated: 47 mL/min — ABNORMAL LOW (ref >60)
Glucose, Bld: 196 mg/dL — ABNORMAL HIGH (ref 70–99)
Potassium: 4.7 mmol/L (ref 3.5–5.1)
Sodium: 136 mmol/L (ref 135–145)

## 2023-01-10 LAB — GLUCOSE, CAPILLARY
Glucose-Capillary: 193 mg/dL — ABNORMAL HIGH (ref 70–99)
Glucose-Capillary: 177 mg/dL — ABNORMAL HIGH (ref 70–99)
Glucose-Capillary: 237 mg/dL — ABNORMAL HIGH (ref 70–99)

## 2023-01-10 LAB — MAGNESIUM: Magnesium: 2.7 mg/dL — ABNORMAL HIGH (ref 1.7–2.4)

## 2023-01-10 MED ORDER — POLYVINYL ALCOHOL 1.4 % OP SOLN: 1.0000 [drp] | Freq: Four times a day (QID) | OPHTHALMIC | Status: DC | PRN

## 2023-01-10 MED ORDER — GLYCOPYRROLATE 0.2 MG/ML IJ SOLN: 0.2000 mg | INTRAMUSCULAR | Status: DC | PRN

## 2023-01-10 MED ORDER — GLYCOPYRROLATE 1 MG PO TABS: 1.0000 mg | ORAL_TABLET | ORAL | Status: DC | PRN

## 2023-01-10 MED ORDER — MORPHINE BOLUS VIA INFUSION: 5.0000 mg | INTRAVENOUS | Status: DC | PRN

## 2023-01-10 MED ORDER — MORPHINE 100MG IN NS 100ML (1MG/ML) PREMIX INFUSION
0.0000 mg/h | INTRAVENOUS | Status: DC
  Administered 2023-01-10: 5 mg/h via INTRAVENOUS
  Filled 2023-01-10: qty 100

## 2023-01-10 MED ORDER — FUROSEMIDE 10 MG/ML IJ SOLN
80.0000 mg | Freq: Once | INTRAMUSCULAR | Status: AC
  Administered 2023-01-10: 80 mg via INTRAVENOUS
  Filled 2023-01-10: qty 8

## 2023-01-10 MED ORDER — SODIUM CHLORIDE 0.9 % IV SOLN: INTRAVENOUS | Status: DC

## 2023-01-30 NOTE — Inpatient Diabetes Management (Signed)
Inpatient Diabetes Program Recommendations  AACE/ADA: New Consensus Statement on Inpatient Glycemic Control (2015)  Target Ranges:  Prepandial:   less than 140 mg/dL      Peak postprandial:   less than 180 mg/dL (1-2 hours)      Critically ill patients:  140 - 180 mg/dL    Latest Reference Range & Units 01/08/23 23:04 01/09/23 03:04 01/09/23 07:13 01/09/23 11:36 01/09/23 15:28 01/09/23 19:01  Glucose-Capillary 70 - 99 mg/dL 829 (H) 562 (H) 130 (H) 256 (H) 266 (H) 238 (H)  (H): Data is abnormally high  Latest Reference Range & Units 01/09/23 23:03 01/08/2023 03:06 01/28/2023 07:42 01/15/2023 11:22  Glucose-Capillary 70 - 99 mg/dL 865 (H) 784 (H) 696 (H) 237 (H)  (H): Data is abnormally high   Admit with:  Acute hypoxic respiratory failure due to pneumonia, neuromuscular weakness Sepsis 2/2 bilateral multifocal pneumonia, post-COVID syndrome, POA Admitted to [facility] from 8/10 to 12/14/2022 due to multifocal pneumonia secondary to COVID-19 virus infection she had a flare of multiple sclerosis during the hospitalization and she was treated with IV Solu-Medrol 1000 mg for 3 days.  Steroid taper was also prescribed on discharge.  On returning home, she started to have increasing shortness of breath within 36 hours of being discharged, so she went back to [facility] and was readmitted.     History: MS   Current Insulin Orders: Novolog Resistant Correction Scale/ SSI (0-20 units) Q4 hours     Note Solumedrol reduced to 60 mg Daily   Pivot tube feeds running 30cc/hr   TPN running 65cc/hr--25 units Insulin added to TPN last night  Pt got 10 units Semglee X 1 dose yest AM      Please consider:   1. Start Semglee 6 units BID (~0.15 units/kg split dose)   2. May consider increasing the Insulin in the TPN for tonight   If insulin adjustments made and CBGs stay >200, could also stop SQ Insulin and Start IV Insulin Drip per Phase 2 of the ICU Glycemic Control Protocol order set if more aggressive CBG  control desired    --Will follow patient during hospitalization--  Ambrose Finland RN, MSN, CDCES Diabetes Coordinator Inpatient Glycemic Control Team Team Pager: [phone] (8a-5p)

## 2023-01-30 NOTE — Death Summary Note (Signed)
Name: GELENE POSTLE MRN: [id number] DOB: January 09, 1976 47 y.o.  Date of Admission: 12/17/2022  8:25 PM Date of Discharge: 02-06-2023 Attending Physician: Cheri Fowler, MD  Discharge Diagnosis: Principal Problem:   Pneumonia due to infectious organism Active Problems:   Acquired hypothyroidism   GERD without esophagitis   Pressure injury of sacral region, stage 2 (HCC)   DVT (deep venous thrombosis) (HCC)   Acute respiratory failure with hypoxia (HCC)   Hypoalbuminemia due to protein-calorie malnutrition (HCC)   Hyponatremia   Essential hypertension   Sepsis (HCC)   Lobar pneumonia (HCC)   Malnutrition of moderate degree   Cause of death: Acute hypoxic and hypercapnic respiratory failure Time of death: 15:31  Disposition and follow-up:   Ms.Jaleya D Bilyeu was discharged from [facility] in expired condition.    Hospital Course: Temiloluwa D. Beckford presented from [facility] due to respiratory distress in the setting of bilateral multifocal pneumonia and post-COVID syndrome which were present on arrival. Initially managed on HHFNC and BiPAP as tolerated. She was treated for bilateral multifocal pneumonia and septic shock with antibiotic and vasopressor therapies. She received empiric treatment for multiple sclerosis flare with high-dose IV steroids and taper. She developed a left-sided pneumothorax that required placement of pigtail catheter. She developed atrial fibrillation with rapid ventricular rate that was treated medically. She had a new diagnosis of heart failure with reduced ejection fraction for which she received IV diuresis. She had anemia and thrombocytopenia of critical illness which were managed with blood transfusions as needed. Ultimately her respiratory status was unable to recover and she was not adequately oxygenating despite maximized mechanical ventilator settings. Her family transitioned her to comfort care and she died.  Signed: Champ Mungo,  DO 2023-02-06, 3:33 PM

## 2023-01-30 NOTE — Progress Notes (Signed)
Daily Progress Note   Patient Name: Amber Williams       Date: 01/14/2023 DOB: 1975/07/19  Age: 47 y.o. MRN#: [id number] Attending Physician: Cheri Fowler, MD Primary Care Physician: Mechele Claude, MD Admit Date: Jan 04, 2023  Reason for Consultation/Follow-up: Establishing goals of care  Subjective: Medical records reviewed including progress notes, labs, imaging. Patient assessed at the bedside.  She remains intubated, unresponsive.  Her family is present in the waiting room and I was joined by her boyfriend at the bedside for prayer.  Created space and opportunity for family's thoughts and feelings on patient's current illness.  Her sister Hillarie Williams reports that they have a good understanding of the severity of patient's illness and lack of further options to keep her alive.  Emotional support therapeutic listening was provided.  They were also supported by chaplain today and feel at peace with the current situation, or least is much as they can be.  They are appreciative of the additional layer of support.    Offered to be present for transition to comfort focused care as previously discussed with critical care team.  Patient's sister informs me that there about 4 more visitors anticipated to come by in the next 4 hours and then they will be ready for transition.  I shared that I may not still be present, though they are in good hands with the primary team.  We discussed the process of compassionate extubation.  Amber Williams will likely be the only family member present during extubation.  No other questions or concerns at this time.  Encouraged family to reach out with any additional palliative needs.  Questions and concerns addressed. PMT will continue to support holistically.   Length of Stay:  15   Physical Exam Vitals and nursing note reviewed.  Constitutional:      General: She is not in acute distress.    Appearance: She is ill-appearing.     Interventions: She is intubated.  Cardiovascular:     Rate and Rhythm: Tachycardia present. Rhythm irregularly irregular.  Pulmonary:     Effort: No respiratory distress. She is intubated.  Skin:    General: Skin is cool and dry.            Vital Signs: BP 126/61   Pulse (!) 142   Temp 98 F (36.7 C) (  Axillary)   Resp (!) 43   Ht 5\' 5"  (1.651 m)   Wt 85.8 kg   SpO2 97%   BMI 31.48 kg/m  SpO2: SpO2: 97 % O2 Device: O2 Device: Ventilator O2 Flow Rate: O2 Flow Rate (L/min): 40 L/min      Palliative Assessment/Data: 10%   Palliative Care Assessment & Plan   Patient Profile: 47 y.o. female  with past medical history of multiple sclerosis, neurogenic bladder, anxiety, DVT on Eliquis, hypothyroidism, hypertension, hyperlipidemia, GERD admitted on 12-31-22 with persistent shortness of breath.    Patient has had recurrent hospitalizations, most recently 8/10 to 8/15 for multifocal pneumonia and COVID with MS flare.  Today is day 14 of hospital admission for sepsis secondary to multifocal pneumonia, acute respiratory failure with hypoxia.  She was initially placed on BiPAP with ongoing dyspnea, ultimately intubated on 9/9.     She remains critically ill with sepsis and septic shock, post-COVID syndrome, persistent A-fib with RVR, AKI, acute on chronic biventricular HFrEF, anemia/thrombocytopenia, suspected acute flare of MS, acute metabolic encephalopathy. PMT has been consulted to assist with goals of care conversation.  Assessment: Goals of care conversation Acute hypoxic/hypercapnic respiratory failure RN's Bilateral multifocal pneumonia Acute respiratory metabolic acidosis Sepsis with septic shock A-fib with RVR Acute on chronic biventricular HFrEF AKI  Recommendations/Plan: Continue DNR as previously discussed  with primary team Patient's family confirmed desire for transition to comfort focused care later in the afternoon after additional visitors can say their goodbyes Psychosocial and emotional support provided Gone from my sight booklet provided  PMT will continue to follow and support holistically   Prognosis:  Hours - Days  Discharge Planning: Anticipated Hospital Death  Care plan was discussed with patient, patient's family, primary team   MDM high         Amber Williams Jeni Salles, PA-C  Palliative Medicine Team Team phone # [phone]  Thank you for allowing the Palliative Medicine Team to assist in the care of this patient. Please utilize secure chat with additional questions, if there is no response within 30 minutes please call the above phone number.  Palliative Medicine Team providers are available by phone from 7am to 7pm daily and can be reached through the team cell phone.  Should this patient require assistance outside of these hours, please call the patient's attending physician.

## 2023-01-30 NOTE — Progress Notes (Signed)
eLink Physician-Brief Progress Note Patient Name: Amber Williams DOB: 12/11/1975 MRN: [id number]   Date of Service  01/06/2023  HPI/Events of Note  ABG reviewed.  eICU Interventions  Ventilator settings adjusted, ABG in 45 minutes.        [name] 01/01/2023, 3:29 AM

## 2023-01-30 NOTE — Procedures (Signed)
Extubation Procedure Note  Patient Details:   Name: Amber Williams DOB: 08-13-1975 MRN: [id number]   Airway Documentation:    Vent end date: 01/06/2023 Vent end time: 1528   Evaluation  O2 sats: transiently fell during during procedure and currently acceptable Complications: No apparent complications Patient did tolerate procedure well. Bilateral Breath Sounds: Diminished   No  Pt extubated to comfort care measures per physician order and family wishes. Pt suctioned via ETT/orally prior, positive cuff leak. Upon extubation pt remained on room air, face cleaned and mouth suctioned. Family remains at bedside. RT will continue to be available as needed.   Derinda Late 01/01/2023, 3:33 PM

## 2023-01-30 NOTE — Progress Notes (Signed)
Kerensa's sister, Falecia Stanley, has confirmed that she and the rest of her family are ready to transition Burlene's care to comfort focused. I have placed comfort care orders at this time and updated the family that these orders are in. No further questions at this time.  Champ Mungo, DO Internal Medicine PGY-3

## 2023-01-30 NOTE — Progress Notes (Signed)
PHARMACY - TOTAL PARENTERAL NUTRITION CONSULT NOTE   Indication:  intolerance to enteral nutrition   Patient Measurements: Height: 5\' 5"  (165.1 cm) Weight: 85.8 kg (189 lb 2.5 oz) IBW/kg (Calculated) : 57 TPN AdjBW (KG): 62.6 Body mass index is 31.48 kg/m.  Assessment: 47 years of age female with multiple sclerosis and history of gastric bypass in 2010, admitted this admission for persistent shortness of breath s/p recent COVID pneumonia. Patient was previously on regular diet with variable meal completions (15-70%) but has been poor appetite. Made NPO with high intubation risk this admission. Post-pyloric tube placement attempted 9/6 was unsuccessful. Pharmacy consulted for TPN.  High re-feeding risk.   Overnight: TPN disconnected and started on D10 at 80 ml/hr which was subsequently stopped by Dr. Merrily Pew. Plan to resume TPN this PM. Afib and code overnight requiring 2 shocks, bicarb, amio, and digoxin.   Glucose / Insulin: CBG 200-300's, moderate SSI (29 units since [place] yesterday; 18 units since TPN initiated. Tapering down high dose methylprednisolone. Electrolytes: K up 5, Phos 2.4- received KPhos 15 mmol, Mg 2. CO2 improved 23, CL down 98, *Hypercalcemic -iCa elevated 1.5>>1.44 (no Calcium in TPN) Renal: Acute AKI, SCr up 1.18, BUN up 51 Hepatic: LFTs wnl. TG elevated 290>>291 on propofol (reduced lipids in TPN>> will further reduce).  Intake / Output; MIVF: UOP decreasing. LBM 8/27. GI Imaging: none since start of TPN GI Surgeries / Procedures: none since start of TPN  Central access: port (will re-attempt PICC) TPN start date: 01/06/23  Nutritional Goals: Goal TPN rate is 80 mL/hr (provides 113 g of protein and 1877 kcals per day)  RD Assessment: Estimated Needs Total Energy Estimated Needs: 1800-2000 Total Protein Estimated Needs: 100-115 grams Total Fluid Estimated Needs: 1.8-2L/d  Current Nutrition:  TPN and Tube feeding  - Pivot at 20 ml/hr but question of tube in  correct position and GIB so holding.  Propofol at 20 mcg/kg/min (~10 ml/hr; ~264 kcals/day).   Plan:  No further TPN per discussion with Dr. Merrily Pew - plan to finish current bag.  Due to insulin in TPN, at 1600 reduce TPN to half rate (30 ml/hr) for 2 hours then stop at 1800. Discard remainder. Will discontinue TPN orders and note.   Link Snuffer, PharmD, BCPS, BCCCP Please refer to [facility] for [facility] Pharmacy numbers 01/09/2023,7:10 AM

## 2023-01-30 NOTE — Progress Notes (Signed)
Titrating down on sedation over night patient RASS -4. Per Elink MD Dr. Warrick Parisian increase patient sedation, patient RASS currently -4.
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
## 2023-01-30 NOTE — Progress Notes (Signed)
Critical ABG results given to Dr. August Saucer and Dr. Merrily Pew. FiO2 increased back to 100%. RT will continue to monitor and be available as needed.

## 2023-01-30 NOTE — Progress Notes (Signed)
Vent settings changed per MD  

## 2023-01-30 NOTE — Progress Notes (Signed)
Heart Failure Navigator Progress Note  Assessed for Heart & Vascular TOC clinic readiness.  Patient does not meet criteria due to transitioning to comfort care per MD.   Navigator will sign off at this time.   Shirl Weir,RN, BSN,MSN Heart Failure Nurse Navigator. Contact by secure chat only.

## 2023-01-30 NOTE — Progress Notes (Signed)
eLink Physician-Brief Progress Note Patient Name: Amber Williams DOB: [birth year] MRN: [id number]   Date of Service  01/06/2023  HPI/Events of Note  Repeat ABG is worse than prior ABG.  eICU Interventions  Patient switched to Pressure Control Ventilation, ABG in 45 minutes.        Thomasene Lot Kivon Aprea 01/09/2023, 5:17 AM

## 2023-01-30 NOTE — Progress Notes (Signed)
   Time of Death: Patient was pronounced dead on  October 09, 2024_ at 1531   Pronounced death by two RN :Chauncy Lean, RN and  Priscella Mann, RN   On exam, no heart sounds or breath sounds were noted after 1 minute of auscultation. .   Attending, MD. Champ Mungo  was notified.    Family Notified: Family was present  Death packet has been completed.  The organ donor network was notified.

## 2023-01-30 NOTE — Progress Notes (Signed)
01/12/2023 1118  Spiritual Encounters  Type of Visit Initial  Care provided to: Pt and family  Conversation partners present during encounter Nurse  Referral source Family  Reason for visit Urgent spiritual support   Chaplain was called to the room by family to pray with them as they had just been told by the medical team that there were not more medical interventions available to help the Pt to recover.  Family is understandably devastated, but leaning on their faith for support and strength.   After prayer, more family arrived to support Pt's mother and sister in their grief.  Chaplain informed family of the 24/7 availability of Spiritual Care services and how to reach out for a chaplain should they desire to see [name] again.  Chaplain services remain available by Spiritual Consult or for emergent cases, paging [phone]  Chaplain Raelene Bott, MDiv [email or web address] [phone]

## 2023-01-30 NOTE — Progress Notes (Signed)
01/04/2023 1612  Spiritual Encounters  Type of Visit Initial  Care provided to: Family  Reason for visit Grief/loss  OnCall Visit No   Called to patient room for EOL as patient removed from ventilator. 12 family members present. Provided support and prayer.

## 2023-01-30 NOTE — Progress Notes (Signed)
Pt remains with minimal secretions. RT unable to obtain sputum at this time. Dr. August Saucer made aware.

## 2023-01-30 DEATH — deceased
